# Patient Record
Sex: Male | Born: 1955
Health system: Southern US, Community
[De-identification: ages and names within clinical notes are randomized; demographics above are authoritative.]

## PROBLEM LIST (undated history)

## (undated) DIAGNOSIS — N529 Male erectile dysfunction, unspecified: Secondary | ICD-10-CM

## (undated) DIAGNOSIS — Z86010 Personal history of colonic polyps: Principal | ICD-10-CM

## (undated) DIAGNOSIS — T7840XA Allergy, unspecified, initial encounter: Secondary | ICD-10-CM

## (undated) DIAGNOSIS — E1065 Type 1 diabetes mellitus with hyperglycemia: Secondary | ICD-10-CM

## (undated) DIAGNOSIS — M545 Low back pain: Secondary | ICD-10-CM

## (undated) DIAGNOSIS — I10 Essential (primary) hypertension: Secondary | ICD-10-CM

## (undated) DIAGNOSIS — C9110 Chronic lymphocytic leukemia of B-cell type not having achieved remission: Secondary | ICD-10-CM

## (undated) DIAGNOSIS — E785 Hyperlipidemia, unspecified: Secondary | ICD-10-CM

## (undated) DIAGNOSIS — M1990 Unspecified osteoarthritis, unspecified site: Secondary | ICD-10-CM

## (undated) HISTORY — DX: Low back pain: M54.5

## (undated) HISTORY — PX: COLONOSCOPY: SHX174

## (undated) HISTORY — DX: Chronic lymphocytic leukemia of B-cell type not having achieved remission: C91.10

## (undated) HISTORY — DX: Type 1 diabetes mellitus with hyperglycemia: E10.65

## (undated) HISTORY — DX: Hyperlipidemia, unspecified: E78.5

## (undated) HISTORY — DX: Essential (primary) hypertension: I10

## (undated) HISTORY — DX: Unspecified osteoarthritis, unspecified site: M19.90

## (undated) HISTORY — DX: Allergy, unspecified, initial encounter: T78.40XA

## (undated) HISTORY — PX: OTHER SURGICAL HISTORY: SHX169

## (undated) HISTORY — DX: Personal history of colonic polyps: Z86.010

## (undated) HISTORY — DX: Male erectile dysfunction, unspecified: N52.9

---

## 1998-09-28 ENCOUNTER — Encounter: Payer: Self-pay | Admitting: Emergency Medicine

## 1998-09-28 ENCOUNTER — Emergency Department (HOSPITAL_COMMUNITY): Admission: EM | Admit: 1998-09-28 | Discharge: 1998-09-28 | Payer: Self-pay | Admitting: Emergency Medicine

## 1998-10-01 ENCOUNTER — Emergency Department (HOSPITAL_COMMUNITY): Admission: EM | Admit: 1998-10-01 | Discharge: 1998-10-01 | Payer: Self-pay | Admitting: Emergency Medicine

## 1998-10-02 ENCOUNTER — Emergency Department (HOSPITAL_COMMUNITY): Admission: EM | Admit: 1998-10-02 | Discharge: 1998-10-02 | Payer: Self-pay | Admitting: Emergency Medicine

## 1998-10-06 ENCOUNTER — Emergency Department (HOSPITAL_COMMUNITY): Admission: EM | Admit: 1998-10-06 | Discharge: 1998-10-06 | Payer: Self-pay | Admitting: Emergency Medicine

## 1998-12-25 ENCOUNTER — Emergency Department (HOSPITAL_COMMUNITY): Admission: EM | Admit: 1998-12-25 | Discharge: 1998-12-25 | Payer: Self-pay | Admitting: Emergency Medicine

## 2005-02-17 ENCOUNTER — Ambulatory Visit: Payer: Self-pay | Admitting: Family Medicine

## 2005-02-20 ENCOUNTER — Ambulatory Visit: Payer: Self-pay | Admitting: Family Medicine

## 2005-04-08 ENCOUNTER — Encounter: Admission: RE | Admit: 2005-04-08 | Discharge: 2005-07-07 | Payer: Self-pay | Admitting: Family Medicine

## 2005-08-11 ENCOUNTER — Ambulatory Visit: Payer: Self-pay | Admitting: Family Medicine

## 2005-12-01 ENCOUNTER — Ambulatory Visit: Payer: Self-pay | Admitting: Family Medicine

## 2006-09-30 ENCOUNTER — Telehealth: Payer: Self-pay | Admitting: Family Medicine

## 2006-12-21 ENCOUNTER — Ambulatory Visit: Payer: Self-pay | Admitting: Family Medicine

## 2006-12-21 DIAGNOSIS — M545 Low back pain, unspecified: Secondary | ICD-10-CM

## 2006-12-21 HISTORY — DX: Low back pain, unspecified: M54.50

## 2006-12-21 LAB — CONVERTED CEMR LAB
ALT: 25 units/L (ref 0–53)
AST: 21 units/L (ref 0–37)
Albumin: 4.2 g/dL (ref 3.5–5.2)
Alkaline Phosphatase: 56 units/L (ref 39–117)
BUN: 15 mg/dL (ref 6–23)
Basophils Absolute: 0 10*3/uL (ref 0.0–0.1)
Basophils Relative: 0.2 % (ref 0.0–1.0)
Bilirubin, Direct: 0.1 mg/dL (ref 0.0–0.3)
CO2: 30 meq/L (ref 19–32)
Calcium: 9.9 mg/dL (ref 8.4–10.5)
Chloride: 101 meq/L (ref 96–112)
Cholesterol: 184 mg/dL (ref 0–200)
Creatinine, Ser: 0.8 mg/dL (ref 0.4–1.5)
Creatinine,U: 90.5 mg/dL
Eosinophils Absolute: 0.4 10*3/uL (ref 0.0–0.6)
Eosinophils Relative: 4.2 % (ref 0.0–5.0)
GFR calc Af Amer: 132 mL/min
GFR calc non Af Amer: 109 mL/min
Glucose, Bld: 259 mg/dL — ABNORMAL HIGH (ref 70–99)
HCT: 42 % (ref 39.0–52.0)
HDL: 47.5 mg/dL (ref >39.0)
Hemoglobin: 14.6 g/dL (ref 13.0–17.0)
Hgb A1c MFr Bld: 9.1 % — ABNORMAL HIGH (ref 4.6–6.0)
LDL Cholesterol: 124 mg/dL — ABNORMAL HIGH (ref 0–99)
Lymphocytes Relative: 27 % (ref 12.0–46.0)
MCHC: 34.7 g/dL (ref 30.0–36.0)
MCV: 92.5 fL (ref 78.0–100.0)
Microalb Creat Ratio: 12.2 mg/g (ref 0.0–30.0)
Microalb, Ur: 1.1 mg/dL (ref 0.0–1.9)
Monocytes Absolute: 0.6 10*3/uL (ref 0.2–0.7)
Monocytes Relative: 7.1 % (ref 3.0–11.0)
Neutro Abs: 5.6 10*3/uL (ref 1.4–7.7)
Neutrophils Relative %: 61.5 % (ref 43.0–77.0)
PSA: 0.9 ng/mL (ref 0.10–4.00)
Platelets: 312 10*3/uL (ref 150–400)
Potassium: 4.7 meq/L (ref 3.5–5.1)
RBC: 4.54 M/uL (ref 4.22–5.81)
RDW: 12.3 % (ref 11.5–14.6)
Sodium: 137 meq/L (ref 135–145)
TSH: 1.87 microintl units/mL (ref 0.35–5.50)
Total Bilirubin: 1 mg/dL (ref 0.3–1.2)
Total CHOL/HDL Ratio: 3.9
Total Protein: 6.9 g/dL (ref 6.0–8.3)
Triglycerides: 61 mg/dL (ref 0–149)
VLDL: 12 mg/dL (ref 0–40)
WBC: 9 10*3/uL (ref 4.5–10.5)

## 2006-12-30 ENCOUNTER — Ambulatory Visit: Payer: Self-pay | Admitting: Family Medicine

## 2006-12-30 DIAGNOSIS — E785 Hyperlipidemia, unspecified: Secondary | ICD-10-CM | POA: Insufficient documentation

## 2006-12-30 DIAGNOSIS — I10 Essential (primary) hypertension: Secondary | ICD-10-CM

## 2006-12-30 DIAGNOSIS — E1065 Type 1 diabetes mellitus with hyperglycemia: Secondary | ICD-10-CM

## 2006-12-30 DIAGNOSIS — N529 Male erectile dysfunction, unspecified: Secondary | ICD-10-CM | POA: Insufficient documentation

## 2006-12-30 DIAGNOSIS — IMO0002 Reserved for concepts with insufficient information to code with codable children: Secondary | ICD-10-CM

## 2006-12-30 HISTORY — DX: Male erectile dysfunction, unspecified: N52.9

## 2006-12-30 HISTORY — DX: Type 1 diabetes mellitus with hyperglycemia: E10.65

## 2006-12-30 HISTORY — DX: Essential (primary) hypertension: I10

## 2006-12-30 HISTORY — DX: Reserved for concepts with insufficient information to code with codable children: IMO0002

## 2006-12-30 HISTORY — DX: Hyperlipidemia, unspecified: E78.5

## 2007-01-13 ENCOUNTER — Ambulatory Visit: Payer: Self-pay | Admitting: Family Medicine

## 2007-01-27 ENCOUNTER — Ambulatory Visit: Payer: Self-pay | Admitting: Family Medicine

## 2007-01-27 ENCOUNTER — Ambulatory Visit: Payer: Self-pay | Admitting: Internal Medicine

## 2007-02-02 ENCOUNTER — Ambulatory Visit: Payer: Self-pay | Admitting: Internal Medicine

## 2007-07-28 ENCOUNTER — Telehealth: Payer: Self-pay | Admitting: Family Medicine

## 2007-08-01 ENCOUNTER — Ambulatory Visit: Payer: Self-pay | Admitting: Family Medicine

## 2007-08-01 ENCOUNTER — Encounter: Payer: Self-pay | Admitting: *Deleted

## 2007-08-01 LAB — CONVERTED CEMR LAB
BUN: 17 mg/dL (ref 6–23)
CO2: 29 meq/L (ref 19–32)
Calcium: 9 mg/dL (ref 8.4–10.5)
Chloride: 105 meq/L (ref 96–112)
Creatinine, Ser: 0.9 mg/dL (ref 0.4–1.5)
GFR calc Af Amer: 114 mL/min
GFR calc non Af Amer: 95 mL/min
Glucose, Bld: 222 mg/dL — ABNORMAL HIGH (ref 70–99)
Hgb A1c MFr Bld: 7.7 % — ABNORMAL HIGH (ref 4.6–6.0)
Potassium: 4.4 meq/L (ref 3.5–5.1)
Sodium: 139 meq/L (ref 135–145)

## 2007-09-08 ENCOUNTER — Telehealth (INDEPENDENT_AMBULATORY_CARE_PROVIDER_SITE_OTHER): Payer: Self-pay | Admitting: *Deleted

## 2007-12-29 ENCOUNTER — Ambulatory Visit: Payer: Self-pay | Admitting: Family Medicine

## 2007-12-29 DIAGNOSIS — E109 Type 1 diabetes mellitus without complications: Secondary | ICD-10-CM | POA: Insufficient documentation

## 2007-12-29 LAB — CONVERTED CEMR LAB
ALT: 18 units/L (ref 0–53)
AST: 24 units/L (ref 0–37)
Albumin: 4.1 g/dL (ref 3.5–5.2)
Alkaline Phosphatase: 55 units/L (ref 39–117)
BUN: 16 mg/dL (ref 6–23)
Basophils Absolute: 0 10*3/uL (ref 0.0–0.1)
Basophils Relative: 0 % (ref 0.0–3.0)
Bilirubin Urine: NEGATIVE
Bilirubin, Direct: 0.1 mg/dL (ref 0.0–0.3)
Blood in Urine, dipstick: NEGATIVE
CO2: 30 meq/L (ref 19–32)
Calcium: 9.8 mg/dL (ref 8.4–10.5)
Chloride: 105 meq/L (ref 96–112)
Cholesterol: 159 mg/dL (ref 0–200)
Creatinine, Ser: 0.8 mg/dL (ref 0.4–1.5)
Creatinine,U: 205.7 mg/dL
Eosinophils Absolute: 0.3 10*3/uL (ref 0.0–0.7)
Eosinophils Relative: 4.1 % (ref 0.0–5.0)
GFR calc Af Amer: 131 mL/min
GFR calc non Af Amer: 108 mL/min
Glucose, Bld: 76 mg/dL (ref 70–99)
Glucose, Urine, Semiquant: NEGATIVE
HCT: 41.3 % (ref 39.0–52.0)
HDL: 61.7 mg/dL (ref >39.0)
Hemoglobin: 14 g/dL (ref 13.0–17.0)
Hgb A1c MFr Bld: 7.5 % — ABNORMAL HIGH (ref 4.6–6.0)
Ketones, urine, test strip: NEGATIVE
LDL Cholesterol: 89 mg/dL (ref 0–99)
Lymphocytes Relative: 31.9 % (ref 12.0–46.0)
MCHC: 33.9 g/dL (ref 30.0–36.0)
MCV: 94.7 fL (ref 78.0–100.0)
Microalb Creat Ratio: 8.8 mg/g (ref 0.0–30.0)
Microalb, Ur: 1.8 mg/dL (ref 0.0–1.9)
Monocytes Absolute: 0.7 10*3/uL (ref 0.1–1.0)
Monocytes Relative: 8.7 % (ref 3.0–12.0)
Neutro Abs: 4.8 10*3/uL (ref 1.4–7.7)
Neutrophils Relative %: 55.3 % (ref 43.0–77.0)
Nitrite: NEGATIVE
PSA: 1.2 ng/mL (ref 0.10–4.00)
Platelets: 275 10*3/uL (ref 150–400)
Potassium: 4.7 meq/L (ref 3.5–5.1)
Protein, U semiquant: NEGATIVE
RBC: 4.36 M/uL (ref 4.22–5.81)
RDW: 13.1 % (ref 11.5–14.6)
Sodium: 144 meq/L (ref 135–145)
Specific Gravity, Urine: 1.02
TSH: 1.49 microintl units/mL (ref 0.35–5.50)
Total Bilirubin: 1.2 mg/dL (ref 0.3–1.2)
Total CHOL/HDL Ratio: 2.6
Total Protein: 7.2 g/dL (ref 6.0–8.3)
Triglycerides: 41 mg/dL (ref 0–149)
Urobilinogen, UA: 2
VLDL: 8 mg/dL (ref 0–40)
WBC Urine, dipstick: NEGATIVE
WBC: 8.5 10*3/uL (ref 4.5–10.5)
pH: 5.5

## 2008-01-12 ENCOUNTER — Ambulatory Visit: Payer: Self-pay | Admitting: Family Medicine

## 2008-02-16 ENCOUNTER — Telehealth: Payer: Self-pay | Admitting: *Deleted

## 2008-04-12 ENCOUNTER — Ambulatory Visit: Payer: Self-pay | Admitting: Family Medicine

## 2008-04-12 LAB — CONVERTED CEMR LAB
BUN: 21 mg/dL (ref 6–23)
CO2: 30 meq/L (ref 19–32)
Calcium: 9.5 mg/dL (ref 8.4–10.5)
Chloride: 104 meq/L (ref 96–112)
Creatinine, Ser: 0.9 mg/dL (ref 0.4–1.5)
GFR calc Af Amer: 114 mL/min
GFR calc non Af Amer: 94 mL/min
Glucose, Bld: 174 mg/dL — ABNORMAL HIGH (ref 70–99)
Hgb A1c MFr Bld: 7.3 % — ABNORMAL HIGH (ref 4.6–6.0)
Potassium: 5.5 meq/L — ABNORMAL HIGH (ref 3.5–5.1)
Sodium: 140 meq/L (ref 135–145)

## 2008-04-23 ENCOUNTER — Ambulatory Visit: Payer: Self-pay | Admitting: Family Medicine

## 2008-10-10 ENCOUNTER — Ambulatory Visit: Payer: Self-pay | Admitting: Family Medicine

## 2008-10-10 LAB — CONVERTED CEMR LAB
BUN: 13 mg/dL (ref 6–23)
CO2: 32 meq/L (ref 19–32)
Calcium: 9.2 mg/dL (ref 8.4–10.5)
Chloride: 104 meq/L (ref 96–112)
Creatinine, Ser: 0.9 mg/dL (ref 0.4–1.5)
GFR calc non Af Amer: 113.64 mL/min (ref >60)
Glucose, Bld: 129 mg/dL — ABNORMAL HIGH (ref 70–99)
Hgb A1c MFr Bld: 8.4 % — ABNORMAL HIGH (ref 4.6–6.5)
Potassium: 3.9 meq/L (ref 3.5–5.1)
Sodium: 141 meq/L (ref 135–145)

## 2008-10-24 ENCOUNTER — Ambulatory Visit: Payer: Self-pay | Admitting: Family Medicine

## 2008-10-25 ENCOUNTER — Telehealth: Payer: Self-pay | Admitting: *Deleted

## 2008-11-08 ENCOUNTER — Telehealth: Payer: Self-pay | Admitting: Family Medicine

## 2009-01-30 ENCOUNTER — Encounter: Payer: Self-pay | Admitting: *Deleted

## 2009-03-28 ENCOUNTER — Ambulatory Visit: Payer: Self-pay | Admitting: Family Medicine

## 2009-03-28 LAB — CONVERTED CEMR LAB
BUN: 8 mg/dL (ref 6–23)
CO2: 31 meq/L (ref 19–32)
Calcium: 9.5 mg/dL (ref 8.4–10.5)
Chloride: 107 meq/L (ref 96–112)
Creatinine, Ser: 0.9 mg/dL (ref 0.4–1.5)
GFR calc non Af Amer: 113.44 mL/min (ref >60)
Glucose, Bld: 148 mg/dL — ABNORMAL HIGH (ref 70–99)
Hgb A1c MFr Bld: 7.8 % — ABNORMAL HIGH (ref 4.6–6.5)
Potassium: 4.6 meq/L (ref 3.5–5.1)
Sodium: 145 meq/L (ref 135–145)

## 2009-05-08 ENCOUNTER — Ambulatory Visit: Payer: Self-pay | Admitting: Family Medicine

## 2009-07-09 ENCOUNTER — Ambulatory Visit: Payer: Self-pay | Admitting: Family Medicine

## 2009-07-09 LAB — CONVERTED CEMR LAB
ALT: 21 units/L (ref 0–53)
AST: 24 units/L (ref 0–37)
Albumin: 4 g/dL (ref 3.5–5.2)
Alkaline Phosphatase: 54 units/L (ref 39–117)
BUN: 16 mg/dL (ref 6–23)
Basophils Absolute: 0 10*3/uL (ref 0.0–0.1)
Basophils Relative: 0.3 % (ref 0.0–3.0)
Bilirubin Urine: NEGATIVE
Bilirubin, Direct: 0.1 mg/dL (ref 0.0–0.3)
Blood in Urine, dipstick: NEGATIVE
CO2: 30 meq/L (ref 19–32)
Calcium: 9 mg/dL (ref 8.4–10.5)
Chloride: 104 meq/L (ref 96–112)
Cholesterol: 129 mg/dL (ref 0–200)
Creatinine, Ser: 0.9 mg/dL (ref 0.4–1.5)
Creatinine,U: 152.2 mg/dL
Eosinophils Absolute: 0.6 10*3/uL (ref 0.0–0.7)
Eosinophils Relative: 6.8 % — ABNORMAL HIGH (ref 0.0–5.0)
GFR calc non Af Amer: 119.42 mL/min (ref >60)
Glucose, Bld: 88 mg/dL (ref 70–99)
Glucose, Urine, Semiquant: NEGATIVE
HCT: 35 % — ABNORMAL LOW (ref 39.0–52.0)
HDL: 44.1 mg/dL (ref >39.00)
Hemoglobin: 12.1 g/dL — ABNORMAL LOW (ref 13.0–17.0)
Hgb A1c MFr Bld: 7.9 % — ABNORMAL HIGH (ref 4.6–6.5)
Ketones, urine, test strip: NEGATIVE
LDL Cholesterol: 78 mg/dL (ref 0–99)
Lymphocytes Relative: 36.7 % (ref 12.0–46.0)
Lymphs Abs: 3 10*3/uL (ref 0.7–4.0)
MCHC: 34.4 g/dL (ref 30.0–36.0)
MCV: 92.5 fL (ref 78.0–100.0)
Microalb Creat Ratio: 0.5 mg/g (ref 0.0–30.0)
Microalb, Ur: 0.8 mg/dL (ref 0.0–1.9)
Monocytes Absolute: 0.6 10*3/uL (ref 0.1–1.0)
Monocytes Relative: 7.1 % (ref 3.0–12.0)
Neutro Abs: 4 10*3/uL (ref 1.4–7.7)
Neutrophils Relative %: 49.1 % (ref 43.0–77.0)
Nitrite: NEGATIVE
PSA: 1.1 ng/mL (ref 0.10–4.00)
Platelets: 295 10*3/uL (ref 150.0–400.0)
Potassium: 4.2 meq/L (ref 3.5–5.1)
Protein, U semiquant: NEGATIVE
RBC: 3.79 M/uL — ABNORMAL LOW (ref 4.22–5.81)
RDW: 13.5 % (ref 11.5–14.6)
Sodium: 141 meq/L (ref 135–145)
Specific Gravity, Urine: 1.025
TSH: 2.21 microintl units/mL (ref 0.35–5.50)
Total Bilirubin: 1 mg/dL (ref 0.3–1.2)
Total CHOL/HDL Ratio: 3
Total Protein: 6.5 g/dL (ref 6.0–8.3)
Triglycerides: 35 mg/dL (ref 0.0–149.0)
Urobilinogen, UA: 0.2
VLDL: 7 mg/dL (ref 0.0–40.0)
WBC Urine, dipstick: NEGATIVE
WBC: 8.1 10*3/uL (ref 4.5–10.5)
pH: 5

## 2009-07-16 ENCOUNTER — Ambulatory Visit: Payer: Self-pay | Admitting: Family Medicine

## 2009-07-16 LAB — HM DIABETES FOOT EXAM

## 2009-10-23 ENCOUNTER — Ambulatory Visit: Payer: Self-pay | Admitting: Family Medicine

## 2009-10-23 LAB — CONVERTED CEMR LAB
BUN: 12 mg/dL (ref 6–23)
CO2: 28 meq/L (ref 19–32)
Calcium: 9.4 mg/dL (ref 8.4–10.5)
Chloride: 110 meq/L (ref 96–112)
Creatinine, Ser: 0.8 mg/dL (ref 0.4–1.5)
GFR calc non Af Amer: 137.58 mL/min (ref >60)
Glucose, Bld: 69 mg/dL — ABNORMAL LOW (ref 70–99)
Hgb A1c MFr Bld: 7.5 % — ABNORMAL HIGH (ref 4.6–6.5)
Potassium: 4.2 meq/L (ref 3.5–5.1)
Sodium: 144 meq/L (ref 135–145)

## 2009-10-28 ENCOUNTER — Ambulatory Visit: Payer: Self-pay | Admitting: Family Medicine

## 2010-01-23 ENCOUNTER — Ambulatory Visit: Payer: Self-pay | Admitting: Family Medicine

## 2010-01-23 LAB — CONVERTED CEMR LAB
CO2: 30 meq/L (ref 19–32)
Chloride: 104 meq/L (ref 96–112)
Creatinine, Ser: 0.8 mg/dL (ref 0.4–1.5)

## 2010-01-30 ENCOUNTER — Ambulatory Visit: Payer: Self-pay | Admitting: Family Medicine

## 2010-03-08 ENCOUNTER — Encounter: Payer: Self-pay | Admitting: Family Medicine

## 2010-03-11 NOTE — Assessment & Plan Note (Signed)
Summary: REVIEW LAB RESULTS // RS   Vital Signs:  Patient profile:   55 year old male Height:      68.25 inches Weight:      165 pounds BMI:     24.99 Temp:     97.7 degrees F oral BP sitting:   160 / 88  (left arm) Cuff size:   regular  Vitals Entered By: Kern Reap CMA Duncan Dull) (May 08, 2009 12:09 PM)  CC: follow-up visit Is Patient Diabetic? Yes Did you bring your meter with you today? No   CC:  follow-up visit.  History of Present Illness: Tyler Scott is a 55 year old male, who comes in today for reevaluation of hypertension and diabetes.  His current insulin dose is 15 units of Lantus daily.  Metformin 1000 mg b.i.d., Glucotrol, 10 mg b.i.d.  Fasting blood sugar 148, hemoglobin A1c7 .6%.  BP 160 systolic, diastolic 88 on lisinopril, 3 mg daily.  Allergies: No Known Drug Allergies  Review of Systems      See HPI  Physical Exam  General:  Well-developed,well-nourished,in no acute distress; alert,appropriate and cooperative throughout examination   Impression & Recommendations:  Problem # 1:  DIABETES MELLITUS, TYPE I (ICD-250.01) Assessment Deteriorated  The following medications were removed from the medication list:    Zestril 20 Mg Tabs (Lisinopril) .Marland Kitchen... Take 1 tablet by mouth every morning His updated medication list for this problem includes:    Metformin Hcl 1000 Mg Tabs (Metformin hcl) .Marland Kitchen... Take 1 tablet by mouth two times a day    Glucotrol 10 Mg Tabs (Glipizide) .Marland Kitchen... Take 1 tablet by mouth two times a day    Aspirin 81 Mg Tbec (Aspirin) ..... Qd    Lantus Solostar 100 Unit/ml Soln (Insulin glargine) .Marland KitchenMarland KitchenMarland KitchenMarland Kitchen 15 units q hs    Lisinopril 40 Mg Tabs (Lisinopril) .Marland Kitchen... 1 tab @ bedtime  Orders: Prescription Created Electronically [phone])  Problem # 2:  HYPERTENSION (ICD-401.9) Assessment: Deteriorated  The following medications were removed from the medication list:    Zestril 20 Mg Tabs (Lisinopril) .Marland Kitchen... Take 1 tablet by mouth every morning His  updated medication list for this problem includes:    Lisinopril 40 Mg Tabs (Lisinopril) .Marland Kitchen... 1 tab @ bedtime  Orders: Prescription Created Electronically [phone])  Complete Medication List: 1)  Metformin Hcl 1000 Mg Tabs (Metformin hcl) .... Take 1 tablet by mouth two times a day 2)  Glucotrol 10 Mg Tabs (Glipizide) .... Take 1 tablet by mouth two times a day 3)  Zocor 20 Mg Tabs (Simvastatin) .... Take 1 tablet by mouth at bedtime 4)  Aspirin 81 Mg Tbec (Aspirin) .... Qd 5)  Motrin 800 Mg  .... As needed 6)  Lantus Solostar 100 Unit/ml Soln (Insulin glargine) .Marland Kitchen.. 15 units q hs 7)  Accu-chek Compact Strp (Glucose blood) .... Test for glucose two times a day   dx 250.0 8)  Levitra 20 Mg Tabs (Vardenafil hcl) .... Uad 9)  Triamcinolone Acetonide 0.025 % Oint (Triamcinolone acetonide) .... Apply to affected area 10)  Lisinopril 40 Mg Tabs (Lisinopril) .Marland Kitchen.. 1 tab @ bedtime  Patient Instructions: 1)  increase the Lantus to 20 units at bedtime to check a fasting blood sugar daily.  If in two weeks your fasting blood sugars are at 100 and continued to 20 units daily.  Is not increased.  The insulin to 25 units daily.  In other words, increase by 5 units every two weeks until your fasting blood sugar drops to 100. 2)  Increase the lisinopril to 40 mg daily check her blood pressure daily if in 4 weeks.  Your blood pressure is normal........Marland Kitchen 130/80, or less............ then 40 mg is the Magic does.    If not increase it to 60 3)  Please schedule a follow-up appointment in 2 months.........250.00 4)  BMP prior to visit, ICD-9: 5)  Hepatic Panel prior to visit, ICD-9: 6)  Lipid Panel prior to visit, ICD-9: 7)  TSH prior to visit, ICD-9: 8)  CBC w/ Diff prior to visit, ICD-9: 9)  Urine-dip prior to visit, ICD-9: 10)  PSA prior to visit, ICD-9: 11)  HbgA1C prior to visit, ICD-9: 12)  Urine Microalbumin prior to visit, ICD-9: Prescriptions: LEVITRA 20 MG TABS (VARDENAFIL HCL) UAD  #6 Tablet x  11   Entered and Authorized by:   Roderick Pee MD   Signed by:   Roderick Pee MD on 05/08/2009   Method used:   Print then Give to Patient   RxID:   [id number] TRIAMCINOLONE ACETONIDE 0.025 % OINT (TRIAMCINOLONE ACETONIDE) apply to affected area  #60 gr x 11   Entered and Authorized by:   Roderick Pee MD   Signed by:   Roderick Pee MD on 05/08/2009   Method used:   Electronically to        [facility]* (mail-order)             ,          Ph: [phone]       Fax: [phone]   RxID:   [id number] ACCU-CHEK COMPACT   STRP (GLUCOSE BLOOD) test for glucose two times a day   dx 250.0  #200 x 3   Entered and Authorized by:   Roderick Pee MD   Signed by:   Roderick Pee MD on 05/08/2009   Method used:   Electronically to        [facility]* (mail-order)             ,          Ph: [phone]       Fax: [phone]   RxID:   [id number] LANTUS SOLOSTAR 100 UNIT/ML  SOLN (INSULIN GLARGINE) 15 units q hs  #2 x 3   Entered and Authorized by:   Roderick Pee MD   Signed by:   Roderick Pee MD on 05/08/2009   Method used:   Electronically to        [facility]* (mail-order)             ,          Ph: [phone]       Fax: [phone]   RxID:   [id number] ZOCOR 20 MG  TABS (SIMVASTATIN) Take 1 tablet by mouth at bedtime  #100 x 4   Entered and Authorized by:   Roderick Pee MD   Signed by:   Roderick Pee MD on 05/08/2009   Method used:   Electronically to        [facility]* (mail-order)             ,          Ph: [phone]       Fax: [phone]   RxID:   [id number] GLUCOTROL 10 MG  TABS (GLIPIZIDE) Take 1 tablet by mouth two times a day  #200 x 4   Entered and Authorized by:   Roderick Pee MD  Signed by:   Roderick Pee MD on 05/08/2009   Method used:   Electronically to        [facility]* (mail-order)             ,          Ph: [phone]       Fax: [phone]   RxID:   [id number] METFORMIN HCL  1000 MG  TABS (METFORMIN HCL) Take 1 tablet by mouth two times a day  #200 x 4   Entered and Authorized by:   Roderick Pee MD   Signed by:   Roderick Pee MD on 05/08/2009   Method used:   Electronically to        [facility]* (mail-order)             ,          Ph: [phone]       Fax: [phone]   RxID:   [id number] LISINOPRIL 40 MG TABS (LISINOPRIL) 1 tab @ bedtime  #100 x 3   Entered and Authorized by:   Roderick Pee MD   Signed by:   Roderick Pee MD on 05/08/2009   Method used:   Electronically to        [facility]* (mail-order)             ,          Ph: [phone]       Fax: [phone]   RxID:   [id number]

## 2010-03-11 NOTE — Assessment & Plan Note (Signed)
Summary: fu on labs/njr   Vital Signs:  Patient profile:   55 year old male Weight:      158 pounds Temp:     97.5 degrees F oral BP sitting:   140 / 90  (left arm) Cuff size:   regular  Vitals Entered By: Kern Reap CMA Duncan Dull) (October 28, 2009 10:15 AM) CC: follow-up visit Is Patient Diabetic? Yes Did you bring your meter with you today? No Pain Assessment Patient in pain? no        CC:  follow-up visit.  History of Present Illness: Tyler Scott is a 56 year old type I diabetic who comes in today for follow-up of diabetes.  He is not taking his metformin on a regular basis because it gives him diarrhea.  He doesn't take his Glucotrol and a regular basis because sometimes his blood sugar in th morning as low.  He does take his Lantus but varies between 50 and 20 units daily.  We discussed various options.  We will try to simplify his treatment program by stopping your medicines and going just strictly with the Lantus.  His blood sugar did yesterday was 69......Marland KitchenA1c 7.5%  He is also complaining of pain in his right knee.  He's had this problem off and on for many years.  However in the last 3, months it's gotten worse.  He said no history of any ligament damage in the past.  It one time he went to see an orthopedist and had it roids.  He used to be a high school basketball official  Allergies: No Known Drug Allergies  Past History:  Past medical, surgical, family and social histories (including risk factors) reviewed for relevance to current acute and chronic problems.  Past Medical History: Reviewed history from 01/12/2008 and no changes required.  Hyperlipidemia Hypertension ED Diabetes mellitus, type I  Family History: Reviewed history from 12/30/2006 and no changes required. Family History of Alcoholism/Addiction Family History Diabetes 1st degree relative  Social History: Reviewed history from 12/30/2006 and no changes  required. Occupation:[employer] Married Never Smoked Alcohol use-no Drug use-no Regular exercise-yes  Review of Systems      See HPI       Flu Vaccine Consent Questions     Do you have a history of severe allergic reactions to this vaccine? no    Any prior history of allergic reactions to egg and/or gelatin? no    Do you have a sensitivity to the preservative Thimersol? no    Do you have a past history of Guillan-Barre Syndrome? no    Do you currently have an acute febrile illness? no    Have you ever had a severe reaction to latex? no    Vaccine information given and explained to patient? yes    Are you currently pregnant? no    Lot Number:[id number]   Exp Date:08/08/2009   Site Given  Left Deltoid IM   Physical Exam  General:  Well-developed,well-nourished,in no acute distress; alert,appropriate and cooperative throughout examination Msk:  an inspection, there is some slight swelling of his right knee.  No erythema.  Ligaments intact   Problems:  Medical Problems Added: 1)  Dx of Effusion,swelling of Joint, Unspecified Site  (ICD-719.00)  Impression & Recommendations:  Problem # 1:  EFFUSION,SWELLING OF  JOINT, UNSPECIFIED SITE (ICD-719.00) Assessment New  Problem # 2:  DIABETES MELLITUS, TYPE I (ICD-250.01) Assessment: Deteriorated  His updated medication list for this problem includes:    Metformin Hcl 1000 Mg Tabs (Metformin hcl) .Marland KitchenMarland KitchenMarland KitchenMarland Kitchen  Take 1 tablet by mouth two times a day    Glucotrol 10 Mg Tabs (Glipizide) .Marland Kitchen... Take 1 tablet by mouth two times a day    Aspirin 81 Mg Tbec (Aspirin) ..... Qd    Lantus Solostar 100 Unit/ml Soln (Insulin glargine) .Marland KitchenMarland KitchenMarland KitchenMarland Kitchen 15 units q hs    Lisinopril 40 Mg Tabs (Lisinopril) .Marland Kitchen... 2 poqam  Complete Medication List: 1)  Metformin Hcl 1000 Mg Tabs (Metformin hcl) .... Take 1 tablet by mouth two times a day 2)  Glucotrol 10 Mg Tabs (Glipizide) .... Take 1 tablet by mouth two times a day 3)  Zocor 20 Mg Tabs (Simvastatin) .... Take 1  tablet by mouth at bedtime 4)  Aspirin 81 Mg Tbec (Aspirin) .... Qd 5)  Motrin 800 Mg  .... As needed 6)  Lantus Solostar 100 Unit/ml Soln (Insulin glargine) .Marland Kitchen.. 15 units q hs 7)  Accu-chek Compact Strp (Glucose blood) .... Test for glucose two times a day   dx 250.0 8)  Levitra 20 Mg Tabs (Vardenafil hcl) .... Uad 9)  Triamcinolone Acetonide 0.025 % Oint (Triamcinolone acetonide) .... Apply to affected area 10)  Lisinopril 40 Mg Tabs (Lisinopril) .... 2 poqam 11)  Pen Needles 31g X 6 Mm Misc (Insulin pen needle) .... Use once daily with lantus for glucose contol  Other Orders: Admin 1st Vaccine (57846) Flu Vaccine 54yrs + (96295)  Patient Instructions: 1)  take 400 mg of Motrin twice daily with food elevation and ice for your right knee pain.  If the symptoms get worse or he would like to consult.  I would recommend Dr. Norlene Campbell. 2)  Stop the med [name], and glipizide, completely.  Take 20 units of Lantus at bedtime.  Check a fasting blood sugar daily in the morning.  If after two weeks, your blood sugar, fasting in the morning, is over 120 increase the Lantus to 25 units nightly.  In other words, increase by 5 units every two weeks and two, your blood sugar drops and stays below 120. 3)  Please schedule a follow-up appointment in 3 months..250.01 4)  BMP prior to visit, ICD-9: 5)  HbgA1C prior to visit, ICD-9: Prescriptions: LEVITRA 20 MG TABS (VARDENAFIL HCL) UAD  #6 Tablet x 11   Entered by:   Kern Reap CMA (AAMA)   Authorized by:   Roderick Pee MD   Signed by:   Kern Reap CMA (AAMA) on 10/28/2009   Method used:   Electronically to        [facility]  [phone]* (retail)       [address] [place], [place]  [address]       Ph: [phone] or [phone]       Fax: [phone]   RxID:   [id number] PEN NEEDLES 31G X 6 MM MISC (INSULIN PEN NEEDLE) use once daily with lantus for glucose contol  #90 x 3   Entered by:   Kern Reap CMA (AAMA)    Authorized by:   Roderick Pee MD   Signed by:   Kern Reap CMA (AAMA) on 10/28/2009   Method used:   Electronically to        [facility]  [phone]* (retail)       [address] [place], [place]  [address]       Ph: [phone] or [phone]       Fax: [phone]   RxID:  [id number]  

## 2010-03-11 NOTE — Assessment & Plan Note (Signed)
Summary: fu on labwork/njr   Vital Signs:  Patient profile:   55 year old male Weight:      164 pounds Temp:     97.9 degrees F oral BP sitting:   132 / 80  (left arm) Cuff size:   regular  Vitals Entered By: Kern Reap CMA Duncan Dull) (July 16, 2009 12:06 PM) CC: follow-up visit   CC:  follow-up visit.  History of Present Illness: Tyler Scott is a 55 year old, married male, nonsmoker, type I diabetic, who comes in today for annual physical evaluation.  His diabetes is treated with metformin 1000 mg b.i.d., Glucotrol 10 mg b.i.d., Lantus 15 units nightly.  Fasting blood sugar 88 however, her hemoglobin A1c7 .9%.  He states the last month since that, warm he's been more active, and the scene.  His blood sugars dropping.  He has diabetic retinopathy.  He saw Dr. Jerolyn Center recently for treatment of the retinopathy.  He has hyperlipidemia for which he takes Zocor 20 mg nightly lipids are ago.  He also takes lisinopril 80 mg daily BP 132/80.  Continue that medication.  He also uses Levitra p.r.n. for ED.  He takes an 81-mg baby aspirin   Allergies: No Known Drug Allergies  Past History:  Past medical, surgical, family and social histories (including risk factors) reviewed, and no changes noted (except as noted below).  Past Medical History: Reviewed history from 01/12/2008 and no changes required.  Hyperlipidemia Hypertension ED Diabetes mellitus, type I  Family History: Reviewed history from 12/30/2006 and no changes required. Family History of Alcoholism/Addiction Family History Diabetes 1st degree relative  Social History: Reviewed history from 12/30/2006 and no changes required. Occupation:[employer] Married Never Smoked Alcohol use-no Drug use-no Regular exercise-yes  Review of Systems      See HPI  Physical Exam  General:  Well-developed,well-nourished,in no acute distress; alert,appropriate and cooperative throughout examination Head:  Normocephalic and  atraumatic without obvious abnormalities. No apparent alopecia or balding. Eyes:  No corneal or conjunctival inflammation noted. EOMI. Perrla. Funduscopic exam benign, without hemorrhages, exudates or papilledema. Vision grossly normal. Ears:  External ear exam shows no significant lesions or deformities.  Otoscopic examination reveals clear canals, tympanic membranes are intact bilaterally without bulging, retraction, inflammation or discharge. Hearing is grossly normal bilaterally. Nose:  External nasal examination shows no deformity or inflammation. Nasal mucosa are pink and moist without lesions or exudates. Mouth:  Oral mucosa and oropharynx without lesions or exudates.  Teeth in good repair. Neck:  No deformities, masses, or tenderness noted. Chest Wall:  No deformities, masses, tenderness or gynecomastia noted. Breasts:  No masses or gynecomastia noted Lungs:  Normal respiratory effort, chest expands symmetrically. Lungs are clear to auscultation, no crackles or wheezes. Heart:  Normal rate and regular rhythm. S1 and S2 normal without gallop, murmur, click, rub or other extra sounds. Abdomen:  Bowel sounds positive,abdomen soft and non-tender without masses, organomegaly or hernias noted. Rectal:  No external abnormalities noted. Normal sphincter tone. No rectal masses or tenderness. Genitalia:  Testes bilaterally descended without nodularity, tenderness or masses. No scrotal masses or lesions. No penis lesions or urethral discharge. Prostate:  Prostate gland firm and smooth, no enlargement, nodularity, tenderness, mass, asymmetry or induration. Msk:  No deformity or scoliosis noted of thoracic or lumbar spine.   Pulses:  R and L carotid,radial,femoral,dorsalis pedis and posterior tibial pulses are full and equal bilaterally Extremities:  No clubbing, cyanosis, edema, or deformity noted with normal full range of motion of all joints.  Neurologic:  No cranial nerve deficits noted. Station and  gait are normal. Plantar reflexes are down-going bilaterally. DTRs are symmetrical throughout. Sensory, motor and coordinative functions appear intact. Skin:  Intact without suspicious lesions or rashes Cervical Nodes:  No lymphadenopathy noted Axillary Nodes:  No palpable lymphadenopathy Inguinal Nodes:  No significant adenopathy Psych:  Cognition and judgment appear intact. Alert and cooperative with normal attention span and concentration. No apparent delusions, illusions, hallucinations  Diabetes Management Exam:    Foot Exam (with socks and/or shoes not present):       Sensory-Pinprick/Light touch:          Left medial foot (L-4): normal          Left dorsal foot (L-5): normal          Left lateral foot (S-1): normal          Right medial foot (L-4): normal          Right dorsal foot (L-5): normal          Right lateral foot (S-1): normal       Sensory-Monofilament:          Left foot: normal          Right foot: normal       Inspection:          Left foot: normal          Right foot: normal       Nails:          Left foot: normal          Right foot: normal    Eye Exam:       Eye Exam done elsewhere          Date: 07/10/2009          Results: diabetic retinopathy          Done by: [email or web address]   Impression & Recommendations:  Problem # 1:  DIABETES MELLITUS, TYPE I (ICD-250.01) Assessment Improved  His updated medication list for this problem includes:    Metformin Hcl 1000 Mg Tabs (Metformin hcl) .Marland Kitchen... Take 1 tablet by mouth two times a day    Glucotrol 10 Mg Tabs (Glipizide) .Marland Kitchen... Take 1 tablet by mouth two times a day    Aspirin 81 Mg Tbec (Aspirin) ..... Qd    Lantus Solostar 100 Unit/ml Soln (Insulin glargine) .Marland KitchenMarland KitchenMarland KitchenMarland Kitchen 15 units q hs    Lisinopril 40 Mg Tabs (Lisinopril) .Marland Kitchen... 2 poqam  Problem # 2:  ERECTILE DYSFUNCTION, ORGANIC (ICD-607.84) Assessment: Improved  His updated medication list for this problem includes:    Levitra 20 Mg Tabs (Vardenafil hcl) .....  Uad  Problem # 3:  HYPERTENSION (ICD-401.9) Assessment: Improved  His updated medication list for this problem includes:    Lisinopril 40 Mg Tabs (Lisinopril) .Marland Kitchen... 2 poqam  Orders: EKG w/ Interpretation (93000)  Problem # 4:  HYPERLIPIDEMIA (ICD-272.4) Assessment: Improved  His updated medication list for this problem includes:    Zocor 20 Mg Tabs (Simvastatin) .Marland Kitchen... Take 1 tablet by mouth at bedtime  Orders: EKG w/ Interpretation (93000)  Problem # 5:  HEALTH SCREENING (ICD-V70.0) Assessment: Unchanged  Complete Medication List: 1)  Metformin Hcl 1000 Mg Tabs (Metformin hcl) .... Take 1 tablet by mouth two times a day 2)  Glucotrol 10 Mg Tabs (Glipizide) .... Take 1 tablet by mouth two times a day 3)  Zocor 20 Mg Tabs (Simvastatin) .... Take 1 tablet by mouth at bedtime 4)  Aspirin 81 Mg Tbec (Aspirin) .... Qd 5)  Motrin 800 Mg  .... As needed 6)  Lantus Solostar 100 Unit/ml Soln (Insulin glargine) .Marland Kitchen.. 15 units q hs 7)  Accu-chek Compact Strp (Glucose blood) .... Test for glucose two times a day   dx 250.0 8)  Levitra 20 Mg Tabs (Vardenafil hcl) .... Uad 9)  Triamcinolone Acetonide 0.025 % Oint (Triamcinolone acetonide) .... Apply to affected area 10)  Lisinopril 40 Mg Tabs (Lisinopril) .... 2 poqam  Patient Instructions: 1)  Please schedule a follow-up appointment in 3 months. 2)  BMP prior to visit, ICD-9: 3)  HbgA1C prior to visit, ICD-9: 4)  Check your blood sugars regularly. If your readings are usually above : or below 70 you should contact our office. 5)  It is important that your Diabetic A1c level is checked every 3 months. 6)  See your eye doctor yearly to check for diabetic eye damage. 7)  Check your feet each night for sore areas, calluses or signs of infection. 8)  Check your Blood Pressure regularly. If it is above: you should make an appointment. Prescriptions: LISINOPRIL 40 MG TABS (LISINOPRIL) 2 poqam  #200 x 3   Entered and Authorized by:   Roderick Pee MD   Signed by:   Roderick Pee MD on 07/16/2009   Method used:   Print then Give to Patient   RxID:   [id number] LISINOPRIL 40 MG TABS (LISINOPRIL) 2 poqam  #200 x 3   Entered and Authorized by:   Roderick Pee MD   Signed by:   Roderick Pee MD on 07/16/2009   Method used:   Electronically to        [facility] (retail)       [address] [place], [place]  [address]       Ph: [phone]       Fax: [phone]   RxID:   [id number]

## 2010-03-20 ENCOUNTER — Encounter: Payer: Self-pay | Admitting: Family Medicine

## 2010-03-20 ENCOUNTER — Ambulatory Visit (INDEPENDENT_AMBULATORY_CARE_PROVIDER_SITE_OTHER): Payer: 59 | Admitting: Family Medicine

## 2010-03-20 DIAGNOSIS — E1065 Type 1 diabetes mellitus with hyperglycemia: Secondary | ICD-10-CM

## 2010-03-20 DIAGNOSIS — I10 Essential (primary) hypertension: Secondary | ICD-10-CM

## 2010-03-20 DIAGNOSIS — IMO0002 Reserved for concepts with insufficient information to code with codable children: Secondary | ICD-10-CM

## 2010-03-20 NOTE — Patient Instructions (Signed)
Continue your current blood pressure medication, however, increased her insulin to 25 units nightly at bedtime.  The goal is to get your hemoglobin A1c from 7.5, down to 6.5.  Return the first week in April for lab work and schedule a follow-up office visit one week after your lab.  Check your blood pressure once weekly, since it's back to normal

## 2010-03-20 NOTE — Progress Notes (Signed)
  Subjective:    Patient ID: Tyler Scott, male    DOB: 1955/05/01, 55 y.o.   MRN: [id number]  Tyler Scott is a 55 year old male type I diabetic who comes in today for evaluation of diabetes and hypertension.  He states his fasting blood sugars at home range from 92, and occasional hundred and 70.  He thinks his average is about 120.  His last hemoglobin A1c January 23, 2010 with 7.5.  However, his fasting blood sugar at that point, was 85.  Blood pressure 130/88, here this morning at home.  His blood pressure was 128/77    Review of Systems Negative    Objective:   Physical Exam Well-developed well-nourished, male in no acute distress       Assessment & Plan:  Diabetes type 1, need to increase his insulin.  Follow-up A1c in 6 weeks.  Hypertension.  Continue above medication

## 2010-04-14 ENCOUNTER — Telehealth: Payer: Self-pay | Admitting: Family Medicine

## 2010-04-14 DIAGNOSIS — E119 Type 2 diabetes mellitus without complications: Secondary | ICD-10-CM

## 2010-04-14 DIAGNOSIS — E785 Hyperlipidemia, unspecified: Secondary | ICD-10-CM

## 2010-04-14 DIAGNOSIS — N529 Male erectile dysfunction, unspecified: Secondary | ICD-10-CM

## 2010-04-14 DIAGNOSIS — I10 Essential (primary) hypertension: Secondary | ICD-10-CM

## 2010-04-14 MED ORDER — GLUCOSE BLOOD VI STRP: ORAL_STRIP | Status: DC

## 2010-04-14 MED ORDER — LISINOPRIL 40 MG PO TABS: 40.0000 mg | ORAL_TABLET | Freq: Every day | ORAL | Status: DC

## 2010-04-14 MED ORDER — METFORMIN HCL 1000 MG PO TABS: 1000.0000 mg | ORAL_TABLET | Freq: Two times a day (BID) | ORAL | Status: DC

## 2010-04-14 MED ORDER — VARDENAFIL HCL 20 MG PO TABS: 20.0000 mg | ORAL_TABLET | Freq: Every day | ORAL | Status: DC | PRN

## 2010-04-14 MED ORDER — PEN NEEDLES 31G X 6 MM MISC: 1.0000 | Status: DC

## 2010-04-14 MED ORDER — SIMVASTATIN 20 MG PO TABS: 20.0000 mg | ORAL_TABLET | Freq: Every day | ORAL | Status: DC

## 2010-04-14 MED ORDER — INSULIN GLARGINE 100 UNIT/ML ~~LOC~~ SOLN: 15.0000 [IU] | Freq: Every day | SUBCUTANEOUS | Status: DC

## 2010-04-14 MED ORDER — GLIPIZIDE 10 MG PO TABS: 10.0000 mg | ORAL_TABLET | Freq: Two times a day (BID) | ORAL | Status: DC

## 2010-04-14 NOTE — Telephone Encounter (Signed)
Spoke with patient and rx sent to correct pharmacy

## 2010-04-14 NOTE — Telephone Encounter (Signed)
Pt called to adv that his meds were sent to the wrong mail order pharmacy..... Adv that meds were supposed to be sent to [facility] order.... Pt could not remember what meds he was supposed to have sent just states "they're the same ones that were accidentally sent to [facility]".

## 2010-05-08 ENCOUNTER — Ambulatory Visit: Payer: 59 | Admitting: Family Medicine

## 2010-05-13 ENCOUNTER — Ambulatory Visit (INDEPENDENT_AMBULATORY_CARE_PROVIDER_SITE_OTHER): Payer: 59 | Admitting: Family Medicine

## 2010-05-13 DIAGNOSIS — L723 Sebaceous cyst: Secondary | ICD-10-CM

## 2010-05-13 DIAGNOSIS — IMO0002 Reserved for concepts with insufficient information to code with codable children: Secondary | ICD-10-CM

## 2010-05-15 ENCOUNTER — Encounter: Payer: Self-pay | Admitting: Family Medicine

## 2010-05-15 NOTE — Progress Notes (Signed)
  Subjective:    Patient ID: Tyler Scott, male    DOB: [birth year], 55 y.o.   MRN: [id number]  Tyler Scott is a 55 year old diabetic, who comes in today ostensibly for removal of a lesion on the posterior portion of his scalp.  He does have multiple small cystic lesions 3 to 4 mm in diameter.  I reassured them.  These are cystic type lesions and do not require removal.  No charge for visit    Review of Systems General and dermatologic review of systems otherwise negative    Objective:   Physical Exam    Well-developed well-nourished, male in no acute distress.  Examination scalp showed multiple small 3 to 4 mm cystic type lesion    Assessment & Plan:  Subcutaneous cystic lesions of the scalp,,,,,,,,,,,, reassured no treatment

## 2010-05-15 NOTE — Patient Instructions (Signed)
Return prn 

## 2010-05-23 ENCOUNTER — Other Ambulatory Visit: Payer: 59

## 2010-05-29 ENCOUNTER — Ambulatory Visit: Payer: 59 | Admitting: Family Medicine

## 2010-07-10 ENCOUNTER — Ambulatory Visit (INDEPENDENT_AMBULATORY_CARE_PROVIDER_SITE_OTHER): Payer: 59 | Admitting: Family Medicine

## 2010-07-10 ENCOUNTER — Encounter: Payer: Self-pay | Admitting: Family Medicine

## 2010-07-10 VITALS — BP 110/80 | Temp 97.7°F | Wt 161.0 lb

## 2010-07-10 DIAGNOSIS — E109 Type 1 diabetes mellitus without complications: Secondary | ICD-10-CM

## 2010-07-10 DIAGNOSIS — M5090 Cervical disc disorder, unspecified, unspecified cervical region: Secondary | ICD-10-CM

## 2010-07-10 MED ORDER — TRAMADOL HCL 50 MG PO TABS: ORAL_TABLET | ORAL | Status: DC

## 2010-07-10 NOTE — Patient Instructions (Signed)
For your neck pain, take 800 mg of Motrin twice daily with food and wear a soft cervical collar at bedtime.  If after two weeks your symptoms persist, then call us and we will get her set up for physical therapy.  If the two cysts in your neck increase in size.  I would recommend you see Dr. Dillard Cannon ent  for consult  Return in two months for follow-up of hy  diabetes,,,,,,,,,,, fasting labs one week prior

## 2010-07-10 NOTE — Progress Notes (Signed)
  Subjective:    Patient ID: Tyler Scott, male    DOB: [birth year], 55 y.o.   MRN: [id number]  [name] is a 55 year old, married man, nonsmoker, and comes in today for evaluation of 3 problems.  For the past two, weeks he's noted a lump on the right side of his neck.  It hasn't gotten any bigger.  No history of trauma.  Also two weeks ago.  He mowed the yard, and the next day he noticed some soreness in right side of his neck and some tingling in his right arm that comes and goes.  No history of trauma.    Review of Systems General metabolic and must discover review of systems otherwise negative.  Last A1c7 .5% in December 2011    Objective:   Physical Exam    Well-developed well-nourished, male in no acute distress.  Examination of the neck appears normal.  On palpation there to 10 mm x 10 mm, soft, movable, enlarged posterior cervical lymph nodes.  The wrist and neck exam is normal.  Neurologic exam normal, including muscle strength, sensation, and reflexes.    Assessment & Plan:  Cervical disk disease.  Plan Motrin 800 b.i.d. A soft cervical collar PT if symptoms persist.  Benign adenopathy,,,,,,,,,, refer to Dr. Narda Bonds for removal if the lesions get bigger,  Diabetes type I.  Return for A1c

## 2010-08-18 ENCOUNTER — Encounter (HOSPITAL_BASED_OUTPATIENT_CLINIC_OR_DEPARTMENT_OTHER): Payer: 59 | Admitting: Oncology

## 2010-08-18 ENCOUNTER — Other Ambulatory Visit: Payer: Self-pay | Admitting: Oncology

## 2010-08-18 DIAGNOSIS — C9110 Chronic lymphocytic leukemia of B-cell type not having achieved remission: Secondary | ICD-10-CM

## 2010-08-18 DIAGNOSIS — C8581 Other specified types of non-Hodgkin lymphoma, lymph nodes of head, face, and neck: Secondary | ICD-10-CM

## 2010-08-18 LAB — CBC WITH DIFFERENTIAL/PLATELET
Basophils Absolute: 0 10*3/uL (ref 0.0–0.1)
Eosinophils Absolute: 0.2 10*3/uL (ref 0.0–0.5)
HCT: 38.5 % (ref 38.4–49.9)
HGB: 12.8 g/dL — ABNORMAL LOW (ref 13.0–17.1)
LYMPH%: 34.7 % (ref 14.0–49.0)
MCV: 88.1 fL (ref 79.3–98.0)
MONO#: 0.6 10*3/uL (ref 0.1–0.9)
MONO%: 6.8 % (ref 0.0–14.0)
NEUT#: 4.8 10*3/uL (ref 1.5–6.5)
Platelets: 284 10*3/uL (ref 140–400)
WBC: 8.5 10*3/uL (ref 4.0–10.3)

## 2010-08-19 ENCOUNTER — Other Ambulatory Visit: Payer: Self-pay | Admitting: Oncology

## 2010-08-19 DIAGNOSIS — C9110 Chronic lymphocytic leukemia of B-cell type not having achieved remission: Secondary | ICD-10-CM

## 2010-08-19 LAB — COMPREHENSIVE METABOLIC PANEL
ALT: 22 U/L (ref 0–53)
Alkaline Phosphatase: 66 U/L (ref 39–117)
Creatinine, Ser: 0.89 mg/dL (ref 0.50–1.35)
Sodium: 139 mEq/L (ref 135–145)
Total Bilirubin: 0.7 mg/dL (ref 0.3–1.2)
Total Protein: 7.3 g/dL (ref 6.0–8.3)

## 2010-08-19 LAB — URIC ACID: Uric Acid, Serum: 5 mg/dL (ref 4.0–7.8)

## 2010-08-21 ENCOUNTER — Other Ambulatory Visit (INDEPENDENT_AMBULATORY_CARE_PROVIDER_SITE_OTHER): Payer: 59

## 2010-08-21 DIAGNOSIS — E1065 Type 1 diabetes mellitus with hyperglycemia: Secondary | ICD-10-CM

## 2010-08-21 DIAGNOSIS — IMO0002 Reserved for concepts with insufficient information to code with codable children: Secondary | ICD-10-CM

## 2010-08-21 DIAGNOSIS — E109 Type 1 diabetes mellitus without complications: Secondary | ICD-10-CM

## 2010-08-21 LAB — HEMOGLOBIN A1C: Hgb A1c MFr Bld: 7.6 % — ABNORMAL HIGH (ref 4.6–6.5)

## 2010-08-21 LAB — BASIC METABOLIC PANEL
BUN: 16 mg/dL (ref 6–23)
Chloride: 103 mEq/L (ref 96–112)
GFR: 139.27 mL/min (ref >60.00)
Glucose, Bld: 128 mg/dL — ABNORMAL HIGH (ref 70–99)
Potassium: 4.5 mEq/L (ref 3.5–5.1)
Sodium: 142 mEq/L (ref 135–145)

## 2010-08-21 LAB — MICROALBUMIN / CREATININE URINE RATIO
Creatinine,U: 120.6 mg/dL
Microalb Creat Ratio: 0.6 mg/g (ref 0.0–30.0)

## 2010-08-26 ENCOUNTER — Other Ambulatory Visit: Payer: Self-pay | Admitting: Oncology

## 2010-08-26 ENCOUNTER — Other Ambulatory Visit (HOSPITAL_COMMUNITY): Payer: 59

## 2010-08-26 ENCOUNTER — Ambulatory Visit (HOSPITAL_COMMUNITY)
Admission: RE | Admit: 2010-08-26 | Discharge: 2010-08-26 | Disposition: A | Discharge status: Home / Self Care | Payer: 59 | Source: Ambulatory Visit | Attending: Oncology | Admitting: Oncology

## 2010-08-26 ENCOUNTER — Encounter (HOSPITAL_COMMUNITY): Payer: Self-pay

## 2010-08-26 DIAGNOSIS — C9100 Acute lymphoblastic leukemia not having achieved remission: Secondary | ICD-10-CM | POA: Insufficient documentation

## 2010-08-26 DIAGNOSIS — C9110 Chronic lymphocytic leukemia of B-cell type not having achieved remission: Secondary | ICD-10-CM

## 2010-08-26 DIAGNOSIS — R599 Enlarged lymph nodes, unspecified: Secondary | ICD-10-CM | POA: Insufficient documentation

## 2010-08-26 MED ORDER — IOHEXOL 300 MG/ML  SOLN
125.0000 mL | Freq: Once | INTRAMUSCULAR | Status: AC | PRN
  Administered 2010-08-26: 125 mL via INTRAVENOUS

## 2010-08-27 ENCOUNTER — Ambulatory Visit (HOSPITAL_COMMUNITY)
Admission: RE | Admit: 2010-08-27 | Discharge: 2010-08-27 | Disposition: A | Discharge status: Home / Self Care | Payer: 59 | Source: Ambulatory Visit | Attending: Oncology | Admitting: Oncology

## 2010-08-27 ENCOUNTER — Other Ambulatory Visit: Payer: Self-pay | Admitting: Interventional Radiology

## 2010-08-27 DIAGNOSIS — C9110 Chronic lymphocytic leukemia of B-cell type not having achieved remission: Secondary | ICD-10-CM

## 2010-08-27 LAB — PROTIME-INR
INR: 0.99 (ref 0.00–1.49)
Prothrombin Time: 13.3 seconds (ref 11.6–15.2)

## 2010-08-27 LAB — CBC
MCHC: 34.7 g/dL (ref 30.0–36.0)
Platelets: 263 10*3/uL (ref 150–400)
RDW: 13 % (ref 11.5–15.5)
WBC: 7.9 10*3/uL (ref 4.0–10.5)

## 2010-08-27 LAB — GLUCOSE, CAPILLARY: Glucose-Capillary: 134 mg/dL — ABNORMAL HIGH (ref 70–99)

## 2010-09-17 ENCOUNTER — Encounter (INDEPENDENT_AMBULATORY_CARE_PROVIDER_SITE_OTHER): Payer: 59 | Admitting: Ophthalmology

## 2010-09-17 DIAGNOSIS — E11359 Type 2 diabetes mellitus with proliferative diabetic retinopathy without macular edema: Secondary | ICD-10-CM

## 2010-09-17 DIAGNOSIS — H43819 Vitreous degeneration, unspecified eye: Secondary | ICD-10-CM

## 2010-09-22 ENCOUNTER — Telehealth: Payer: Self-pay | Admitting: Family Medicine

## 2010-09-22 NOTE — Telephone Encounter (Signed)
Pt requesting results from recent labs (A1C count and recent blood pressure) Please contact pt.

## 2010-09-22 NOTE — Telephone Encounter (Signed)
Continue your current medications.  Fasting blood sugar daily in the morning.  Office visit next week to review.  All your medications and options for treating her diabetes

## 2010-09-23 NOTE — Telephone Encounter (Signed)
Spoke with patient.

## 2010-10-06 ENCOUNTER — Other Ambulatory Visit: Payer: Self-pay | Admitting: Oncology

## 2010-10-06 ENCOUNTER — Encounter (HOSPITAL_BASED_OUTPATIENT_CLINIC_OR_DEPARTMENT_OTHER): Payer: 59 | Admitting: Oncology

## 2010-10-06 DIAGNOSIS — C8581 Other specified types of non-Hodgkin lymphoma, lymph nodes of head, face, and neck: Secondary | ICD-10-CM

## 2010-10-06 LAB — URIC ACID: Uric Acid, Serum: 5.1 mg/dL (ref 4.0–7.8)

## 2010-10-06 LAB — CBC WITH DIFFERENTIAL/PLATELET
BASO%: 0.5 % (ref 0.0–2.0)
EOS%: 2.2 % (ref 0.0–7.0)
Eosinophils Absolute: 0.2 10*3/uL (ref 0.0–0.5)
LYMPH%: 31 % (ref 14.0–49.0)
MCH: 31 pg (ref 27.2–33.4)
MCHC: 34 g/dL (ref 32.0–36.0)
MCV: 91.3 fL (ref 79.3–98.0)
MONO%: 7.6 % (ref 0.0–14.0)
Platelets: 251 10*3/uL (ref 140–400)
RBC: 4.24 10*6/uL (ref 4.20–5.82)

## 2010-10-06 LAB — COMPREHENSIVE METABOLIC PANEL
Alkaline Phosphatase: 58 U/L (ref 39–117)
Glucose, Bld: 244 mg/dL — ABNORMAL HIGH (ref 70–99)
Sodium: 142 mEq/L (ref 135–145)
Total Bilirubin: 0.8 mg/dL (ref 0.3–1.2)
Total Protein: 7.2 g/dL (ref 6.0–8.3)

## 2010-12-20 ENCOUNTER — Encounter: Payer: Self-pay | Admitting: Oncology

## 2010-12-20 DIAGNOSIS — C9110 Chronic lymphocytic leukemia of B-cell type not having achieved remission: Secondary | ICD-10-CM | POA: Insufficient documentation

## 2010-12-22 ENCOUNTER — Other Ambulatory Visit: Payer: Self-pay | Admitting: *Deleted

## 2010-12-22 DIAGNOSIS — E119 Type 2 diabetes mellitus without complications: Secondary | ICD-10-CM

## 2010-12-22 MED ORDER — GLUCOSE BLOOD VI STRP: ORAL_STRIP | Status: DC

## 2010-12-22 MED ORDER — GLIPIZIDE 10 MG PO TABS: 10.0000 mg | ORAL_TABLET | Freq: Two times a day (BID) | ORAL | Status: DC

## 2010-12-22 MED ORDER — METFORMIN HCL 1000 MG PO TABS: 1000.0000 mg | ORAL_TABLET | Freq: Two times a day (BID) | ORAL | Status: DC

## 2010-12-24 ENCOUNTER — Encounter: Payer: Self-pay | Admitting: *Deleted

## 2010-12-24 ENCOUNTER — Other Ambulatory Visit: Payer: Self-pay | Admitting: *Deleted

## 2010-12-24 DIAGNOSIS — E119 Type 2 diabetes mellitus without complications: Secondary | ICD-10-CM

## 2010-12-24 MED ORDER — GLUCOSE BLOOD VI STRP: ORAL_STRIP | Status: DC

## 2010-12-24 MED ORDER — METFORMIN HCL 1000 MG PO TABS: 1000.0000 mg | ORAL_TABLET | Freq: Two times a day (BID) | ORAL | Status: DC

## 2010-12-24 MED ORDER — GLIPIZIDE 10 MG PO TABS: 10.0000 mg | ORAL_TABLET | Freq: Two times a day (BID) | ORAL | Status: DC

## 2010-12-29 ENCOUNTER — Ambulatory Visit (HOSPITAL_BASED_OUTPATIENT_CLINIC_OR_DEPARTMENT_OTHER): Payer: 59 | Admitting: Oncology

## 2010-12-29 ENCOUNTER — Other Ambulatory Visit: Payer: 59 | Admitting: Lab

## 2010-12-29 VITALS — BP 128/79 | HR 93 | Temp 97.2°F | Ht 68.5 in | Wt 161.3 lb

## 2010-12-29 DIAGNOSIS — C91Z0 Other lymphoid leukemia not having achieved remission: Secondary | ICD-10-CM

## 2010-12-29 DIAGNOSIS — E119 Type 2 diabetes mellitus without complications: Secondary | ICD-10-CM

## 2010-12-29 DIAGNOSIS — C9110 Chronic lymphocytic leukemia of B-cell type not having achieved remission: Secondary | ICD-10-CM

## 2010-12-29 NOTE — Progress Notes (Signed)
[facility] OFFICE PROGRESS NOTE  Tyler ALLEN, MD  DIAGNOSIS:  Small lymphocytic lymphoma; stage III.  CURRENT THERAPY:  Watchful observation.  INTERVAL HISTORY: Tyler Scott 55 y.o. male returns for regular follow up.  He has not noticed much changes to the size and number of the small cervical neck nodes.  He denies nodal swelling anywhere else such as supraclav, axillary, inguinal.  He is working full time without fatigue or performance problem at work.  He denies headache, visual change, mucositis, dysphagia, odynophagia, drenching night sweat, chest pain, palpitation, hemoptysis, hematemesis, abdominal pain, abdominal swelling, melena, hematochezia, hematuria, skin rash, low back pain, bowel or bladder incontinence.  MEDICAL HISTORY: Past Medical History  Diagnosis Date  . DIABETES MELLITUS, TYPE II 11/24/2006  . DIABETES MELLITUS, TYPE I 12/29/2007  . DIABETES MELLITUS, TYPE I, UNCONTROLLED 12/30/2006  . HYPERLIPIDEMIA 12/30/2006  . HYPERTENSION 12/30/2006  . ERECTILE DYSFUNCTION, ORGANIC 12/30/2006  . LOW BACK PAIN, ACUTE 12/21/2006  . Diabetic retinopathy   . CLL (chronic lymphocytic leukemia)     stage III: asymptomatic; on observation    SURGICAL HISTORY: No past surgical history on file.  MEDICATIONS: Current Outpatient Prescriptions  Medication Sig Dispense Refill  . glipiZIDE (GLUCOTROL) 10 MG tablet Take 1 tablet (10 mg total) by mouth 2 (two) times daily before a meal.  200 tablet  3  . glucose blood (ACCU-CHEK COMPACT STRIPS) test strip Use as instructed  100 each  3  . ibuprofen (ADVIL,MOTRIN) 800 MG tablet Take 800 mg by mouth every 6 (six) hours as needed.        . insulin glargine (LANTUS) 100 UNIT/ML injection Inject 25 Units into the skin at bedtime as needed.       . Insulin Pen Needle (PEN NEEDLES) 31G X 6 MM MISC 1 Syringe by Does not apply route 1 dose over 46 hours. Once daily for glucose control  100 each  3  . lisinopril  (PRINIVIL,ZESTRIL) 40 MG tablet Take 1 tablet (40 mg total) by mouth daily.  100 tablet  3  . metFORMIN (GLUCOPHAGE) 1000 MG tablet Take 500 mg by mouth daily.        . simvastatin (ZOCOR) 20 MG tablet Take 1 tablet (20 mg total) by mouth at bedtime.  100 tablet  3  . vardenafil (LEVITRA) 20 MG tablet Take 1 tablet (20 mg total) by mouth daily as needed.  6 tablet  11    ALLERGIES:   has no known allergies.  REVIEW OF SYSTEMS:  The rest of the 14-point review of system was negative.   Filed Vitals:   12/29/10 1443  BP: 128/79  Pulse: 93  Temp: 97.2 F (36.2 C)   Wt Readings from Last 3 Encounters:  12/29/10 161 lb 4.8 oz (73.165 kg)  10/06/10 162 lb 4.8 oz (73.619 kg)  07/10/10 161 lb (73.029 kg)   ECOG Performance status: 0  PHYSICAL EXAMINATION:   General:  well-nourished in no acute distress.  Eyes:  no scleral icterus.  ENT:  There were no oropharyngeal lesions.  Neck was without thyromegaly.  Lymphatics:  Positive for shotty right cervical neck adenopathy about 1x2cm.   Negative for supraclavicular or axillary adenopathy.  Respiratory: lungs were clear bilaterally without wheezing or crackles.  Cardiovascular:  Regular rate and rhythm, S1/S2, without murmur, rub or gallop.  There was no pedal edema.  GI:  abdomen was soft, flat, nontender, nondistended, without organomegaly.  Muscoloskeletal:  no spinal tenderness of palpation of  vertebral spine.  Skin exam was without echymosis, petichae.  Neuro exam was nonfocal.  Patient was able to get on and off exam table without assistance.  Gait was normal.  Patient was alerted and oriented.  Attention was good.   Language was appropriate.  Mood was normal without depression.  Speech was not pressured.  Thought content was not tangential.    LABORATORY/RADIOLOGY DATA:  None.  ASSESSMENT AND PLAN:   1.  Stage III small lymphocytic lymphoma (SLL).  I discussed with [name] that again he has no indication for treatment with  chemotherapy.  He has no B-symptoms, pancytopenia, recurrent infection.  Chemotherapy does not cure CLL.  It can go into remission but with high chance of recurrence.  Therefore, we only start chemo if patients with CLL/SLL have convincing indications for treatment with chemo.  I again recommended watchful observation.   He expressed informed understanding and wished to proceed with stated plan.  2.  Diabetes Mellitus, type II:  he's on insulin with Dr. Tawanna Scott. 3.  Hyperlipidemia:  he is on simvastatin. 4.  Follow up:  Lab only in 3 months.  Lab/RV with me in 6 months.  I advised him to contact us with concerning symptoms.

## 2010-12-31 ENCOUNTER — Telehealth: Payer: Self-pay | Admitting: Oncology

## 2010-12-31 NOTE — Telephone Encounter (Signed)
S/w the pt advising him of his lab appt in feb and his appts for may with the md.

## 2011-02-28 ENCOUNTER — Telehealth: Payer: Self-pay | Admitting: Oncology

## 2011-02-28 NOTE — Telephone Encounter (Signed)
[name] has appt for 2013

## 2011-03-30 ENCOUNTER — Other Ambulatory Visit (HOSPITAL_BASED_OUTPATIENT_CLINIC_OR_DEPARTMENT_OTHER): Payer: 59 | Admitting: Lab

## 2011-03-30 ENCOUNTER — Telehealth: Payer: Self-pay

## 2011-03-30 DIAGNOSIS — C9110 Chronic lymphocytic leukemia of B-cell type not having achieved remission: Secondary | ICD-10-CM

## 2011-03-30 LAB — COMPREHENSIVE METABOLIC PANEL
BUN: 18 mg/dL (ref 6–23)
CO2: 27 mEq/L (ref 19–32)
Calcium: 9.2 mg/dL (ref 8.4–10.5)
Chloride: 101 mEq/L (ref 96–112)
Creatinine, Ser: 1.04 mg/dL (ref 0.50–1.35)

## 2011-03-30 LAB — CBC WITH DIFFERENTIAL/PLATELET
Basophils Absolute: 0 10*3/uL (ref 0.0–0.1)
HCT: 36.2 % — ABNORMAL LOW (ref 38.4–49.9)
HGB: 12.3 g/dL — ABNORMAL LOW (ref 13.0–17.1)
MCH: 31 pg (ref 27.2–33.4)
MONO#: 0.6 10*3/uL (ref 0.1–0.9)
NEUT%: 45.9 % (ref 39.0–75.0)
WBC: 11.4 10*3/uL — ABNORMAL HIGH (ref 4.0–10.3)
lymph#: 5.1 10*3/uL — ABNORMAL HIGH (ref 0.9–3.3)

## 2011-03-30 LAB — LACTATE DEHYDROGENASE: LDH: 100 U/L (ref 94–250)

## 2011-03-30 NOTE — Telephone Encounter (Signed)
Message copied by Kallie Locks on Mon Mar 30, 2011  3:57 PM ------      Message from: HA, Raliegh Ip T      Created: Mon Mar 30, 2011  2:08 PM       Please call pt.  His WBC/lymphoytes from CLL have slightly increased.  Did he change his mind about CT and starting treatment?  Any concerning symptoms.  His next appointment with me is in May 2013.  But if he has symptoms or changes his mind about treatment, I'll see him sooner.  Thanks.

## 2011-04-19 ENCOUNTER — Other Ambulatory Visit: Payer: Self-pay | Admitting: Family Medicine

## 2011-05-13 ENCOUNTER — Other Ambulatory Visit: Payer: Self-pay | Admitting: Oncology

## 2011-06-26 ENCOUNTER — Telehealth: Payer: Self-pay | Admitting: *Deleted

## 2011-06-26 ENCOUNTER — Other Ambulatory Visit (HOSPITAL_BASED_OUTPATIENT_CLINIC_OR_DEPARTMENT_OTHER): Payer: 59 | Admitting: Lab

## 2011-06-26 ENCOUNTER — Ambulatory Visit (HOSPITAL_BASED_OUTPATIENT_CLINIC_OR_DEPARTMENT_OTHER): Payer: 59 | Admitting: Oncology

## 2011-06-26 VITALS — BP 114/68 | HR 86 | Temp 96.8°F | Ht 68.5 in | Wt 163.0 lb

## 2011-06-26 DIAGNOSIS — E119 Type 2 diabetes mellitus without complications: Secondary | ICD-10-CM

## 2011-06-26 DIAGNOSIS — C9110 Chronic lymphocytic leukemia of B-cell type not having achieved remission: Secondary | ICD-10-CM

## 2011-06-26 DIAGNOSIS — C91Z0 Other lymphoid leukemia not having achieved remission: Secondary | ICD-10-CM

## 2011-06-26 DIAGNOSIS — E785 Hyperlipidemia, unspecified: Secondary | ICD-10-CM

## 2011-06-26 DIAGNOSIS — R161 Splenomegaly, not elsewhere classified: Secondary | ICD-10-CM

## 2011-06-26 LAB — CBC WITH DIFFERENTIAL/PLATELET
BASO%: 0.5 % (ref 0.0–2.0)
EOS%: 1.6 % (ref 0.0–7.0)
HCT: 38.5 % (ref 38.4–49.9)
MCH: 31 pg (ref 27.2–33.4)
MCHC: 33.4 g/dL (ref 32.0–36.0)
MONO%: 6.8 % (ref 0.0–14.0)
NEUT%: 51.4 % (ref 39.0–75.0)
RDW: 13.7 % (ref 11.0–14.6)
lymph#: 3.7 10*3/uL — ABNORMAL HIGH (ref 0.9–3.3)

## 2011-06-26 LAB — COMPREHENSIVE METABOLIC PANEL
ALT: 16 U/L (ref 0–53)
AST: 20 U/L (ref 0–37)
Albumin: 4.6 g/dL (ref 3.5–5.2)
Calcium: 9.5 mg/dL (ref 8.4–10.5)
Chloride: 104 mEq/L (ref 96–112)
Potassium: 5.1 mEq/L (ref 3.5–5.3)

## 2011-06-26 LAB — LACTATE DEHYDROGENASE: LDH: 119 U/L (ref 94–250)

## 2011-06-26 NOTE — Telephone Encounter (Signed)
gave patient appointment on 09-14-2011 11-26-2011 printed out calendar and gave to the patient

## 2011-06-26 NOTE — Progress Notes (Signed)
[facility]  Telephone:([phone]) [phone] Fax:([phone]   OFFICE PROGRESS NOTE   Cc:  Tyler Georges, MD, MD  DIAGNOSIS: Small lymphocytic lymphoma; stage III.   CURRENT THERAPY: Watchful observation.  INTERVAL HISTORY: Tyler Scott 56 y.o. male returns for regular follow up.  He recently developed diffused body rash and was given a course of Prednisone taper.  He noticed significant improvement of his adenopathy.  He can hardly palpate his neck nodes now.  He felt great while being on Prednisone.  He is off of Prednisone now.  His rash has completely resolved.  He reports feeling well.  He still works full time without problem.    Patient denies fatigue, headache, visual changes, confusion, drenching night sweats, palpable lymph node swelling, mucositis, odynophagia, dysphagia, nausea vomiting, jaundice, chest pain, palpitation, shortness of breath, dyspnea on exertion, productive cough, gum bleeding, epistaxis, hematemesis, hemoptysis, abdominal pain, abdominal swelling, early satiety, melena, hematochezia, hematuria, skin rash, spontaneous bleeding, joint swelling, joint pain, heat or cold intolerance, bowel bladder incontinence, back pain, focal motor weakness, paresthesia, depression, suicidal or homocidal ideation, feeling hopelessness.   Past Medical History  Diagnosis Date  . DIABETES MELLITUS, TYPE II 11/24/2006  . DIABETES MELLITUS, TYPE I 12/29/2007  . DIABETES MELLITUS, TYPE I, UNCONTROLLED 12/30/2006  . HYPERLIPIDEMIA 12/30/2006  . HYPERTENSION 12/30/2006  . ERECTILE DYSFUNCTION, ORGANIC 12/30/2006  . LOW BACK PAIN, ACUTE 12/21/2006  . Diabetic retinopathy   . CLL (chronic lymphocytic leukemia)     stage III: asymptomatic; on observation    No past surgical history on file.  Current Outpatient Prescriptions  Medication Sig Dispense Refill  . B-D UF III MINI PEN NEEDLES 31G X 5 MM MISC USE ONCE DAILY WITH LANTUS FOR GLUCOSE CONTOL  100  each  1  . glucose blood (ACCU-CHEK COMPACT STRIPS) test strip Use as instructed  100 each  3  . ibuprofen (ADVIL,MOTRIN) 800 MG tablet Take 800 mg by mouth every 6 (six) hours as needed.        . insulin glargine (LANTUS) 100 UNIT/ML injection Inject 25 Units into the skin at bedtime as needed.       . Insulin Pen Needle (PEN NEEDLES) 31G X 6 MM MISC 1 Syringe by Does not apply route 1 dose over 46 hours. Once daily for glucose control  100 each  3  . LEVITRA 20 MG tablet TAKE 1 TABLET BY MOUTH EVERY DAY AS NEEDED  6 tablet  1  . lisinopril (PRINIVIL,ZESTRIL) 40 MG tablet Take 1 tablet (40 mg total) by mouth daily.  100 tablet  3  . simvastatin (ZOCOR) 20 MG tablet Take 1 tablet (20 mg total) by mouth at bedtime.  100 tablet  3    ALLERGIES:   has no known allergies.  REVIEW OF SYSTEMS:  The rest of the 14-point review of system was negative.   Filed Vitals:   06/26/11 1333  BP: 114/68  Pulse: 86  Temp: 96.8 F (36 C)   Wt Readings from Last 3 Encounters:  06/26/11 163 lb (73.936 kg)  12/29/10 161 lb 4.8 oz (73.165 kg)  10/06/10 162 lb 4.8 oz (73.619 kg)   ECOG Performance status: 0  PHYSICAL EXAMINATION:   General:  well-nourished in no acute distress.  Eyes:  no scleral icterus.  ENT:  There were no oropharyngeal lesions.  Neck was without thyromegaly.  Lymphatics:  Shotty cervical, supraclavicular or axillary adenopathy.  The largest one was about 2cm in the left  cervical area.   Respiratory: lungs were clear bilaterally without wheezing or crackles.  Cardiovascular:  Regular rate and rhythm, S1/S2, without murmur, rub or gallop.  There was no pedal edema.  GI:  abdomen was soft, flat, nontender, nondistended, without hepatomegaly but positive for splenomegaly about 3cm below costal margin.  Muscoloskeletal:  no spinal tenderness of palpation of vertebral spine.  Skin exam was without echymosis, petichae.  Neuro exam was nonfocal.  Patient was able to get on and off exam table  without assistance.  Gait was normal.  Patient was alerted and oriented.  Attention was good.   Language was appropriate.  Mood was normal without depression.  Speech was not pressured.  Thought content was not tangential.     LABORATORY/RADIOLOGY DATA:  Lab Results  Component Value Date   WBC 9.4 06/26/2011   HGB 12.8* 06/26/2011   HCT 38.5 06/26/2011   PLT 299 06/26/2011   GLUCOSE 216* 03/30/2011   CHOL 129 07/09/2009   TRIG 35.0 07/09/2009   HDL 44.10 07/09/2009   LDLCALC 78 07/09/2009   ALKPHOS 61 03/30/2011   ALT 16 03/30/2011   AST 15 03/30/2011   NA 138 03/30/2011   K 4.7 03/30/2011   CL 101 03/30/2011   CREATININE 1.04 03/30/2011   BUN 18 03/30/2011   CO2 27 03/30/2011   PSA 1.10 07/09/2009   INR 0.99 08/27/2010   HGBA1C 7.6* 08/21/2010   MICROALBUR 0.7 08/21/2010    ASSESSMENT AND PLAN:   1. Stage III small lymphocytic lymphoma (SLL given diffuse adenopathy and splenomegaly).  Tyler Scott still has no indication for treatment with chemotherapy. He has no B-symptoms, pancytopenia, recurrent infection. I explained to Tyler Scott that chemotherapy in CLL only result in temporary remission; it does not cure CLL. It can go into remission but with high chance of recurrence in certain patients. Therefore, we only start chemo if patients with CLL/SLL have definitive indications for treatment with chemo. I again recommended watchful observation. He expressed informed understanding and wished to proceed with stated plan.   2. Diabetes Mellitus, type II: he's on insulin with Dr. Tawanna Scott.   3. Hyperlipidemia: he is on simvastatin per PCP.   4. Follow up: Lab only in 3 months. Lab/RV with [place] in 5-6 months. I advised him to contact us with concerning symptoms.   5.  Skin rash:  Unclear etiology; but resolved now.  Rarely dose CLL present with diffuse skin rash.  In the future, if he has diffuse skin rash again; I may consider skin biopsy to rule out cutaneous manifestation of his disease.    The  length of time of the face-to-face encounter was . More than 50% of time was spent counseling and coordination of care.

## 2011-06-30 ENCOUNTER — Encounter (INDEPENDENT_AMBULATORY_CARE_PROVIDER_SITE_OTHER): Payer: 59 | Admitting: Ophthalmology

## 2011-06-30 DIAGNOSIS — E11359 Type 2 diabetes mellitus with proliferative diabetic retinopathy without macular edema: Secondary | ICD-10-CM

## 2011-06-30 DIAGNOSIS — H43819 Vitreous degeneration, unspecified eye: Secondary | ICD-10-CM

## 2011-06-30 DIAGNOSIS — E1139 Type 2 diabetes mellitus with other diabetic ophthalmic complication: Secondary | ICD-10-CM

## 2011-06-30 DIAGNOSIS — H2510 Age-related nuclear cataract, unspecified eye: Secondary | ICD-10-CM

## 2011-06-30 DIAGNOSIS — E1165 Type 2 diabetes mellitus with hyperglycemia: Secondary | ICD-10-CM

## 2011-07-10 ENCOUNTER — Other Ambulatory Visit (INDEPENDENT_AMBULATORY_CARE_PROVIDER_SITE_OTHER): Payer: 59 | Admitting: Ophthalmology

## 2011-07-10 DIAGNOSIS — H35059 Retinal neovascularization, unspecified, unspecified eye: Secondary | ICD-10-CM

## 2011-07-15 ENCOUNTER — Other Ambulatory Visit: Payer: Self-pay | Admitting: Family Medicine

## 2011-09-14 ENCOUNTER — Other Ambulatory Visit (HOSPITAL_BASED_OUTPATIENT_CLINIC_OR_DEPARTMENT_OTHER): Payer: 59

## 2011-09-14 ENCOUNTER — Telehealth: Payer: Self-pay | Admitting: *Deleted

## 2011-09-14 DIAGNOSIS — C9110 Chronic lymphocytic leukemia of B-cell type not having achieved remission: Secondary | ICD-10-CM

## 2011-09-14 LAB — CBC WITH DIFFERENTIAL/PLATELET
BASO%: 0.4 % (ref 0.0–2.0)
EOS%: 2.8 % (ref 0.0–7.0)
MCH: 31.1 pg (ref 27.2–33.4)
MCV: 93.1 fL (ref 79.3–98.0)
MONO%: 7.3 % (ref 0.0–14.0)
RBC: 3.99 10*6/uL — ABNORMAL LOW (ref 4.20–5.82)
RDW: 13.3 % (ref 11.0–14.6)
lymph#: 3.4 10*3/uL — ABNORMAL HIGH (ref 0.9–3.3)

## 2011-09-14 NOTE — Telephone Encounter (Signed)
Message copied by Wende Mott on Mon Sep 14, 2011  4:17 PM ------      Message from: HA, Raliegh Ip T      Created: Mon Sep 14, 2011 12:05 PM       Please call pt.  His lymphoma (CLL) is stable.  Continue watchful observation.  Thanks.

## 2011-09-14 NOTE — Telephone Encounter (Signed)
Called pt and informed of lab/ CLL stable per Dr. Gaylyn Rong.  Keep next appt in October for lab and office visit as scheduled.  Pt verbalized understanding.

## 2011-09-21 ENCOUNTER — Other Ambulatory Visit: Payer: Self-pay | Admitting: Family Medicine

## 2011-11-13 ENCOUNTER — Ambulatory Visit (INDEPENDENT_AMBULATORY_CARE_PROVIDER_SITE_OTHER): Payer: 59 | Admitting: Ophthalmology

## 2011-11-13 DIAGNOSIS — E11359 Type 2 diabetes mellitus with proliferative diabetic retinopathy without macular edema: Secondary | ICD-10-CM

## 2011-11-13 DIAGNOSIS — I10 Essential (primary) hypertension: Secondary | ICD-10-CM

## 2011-11-13 DIAGNOSIS — H2510 Age-related nuclear cataract, unspecified eye: Secondary | ICD-10-CM

## 2011-11-13 DIAGNOSIS — H43819 Vitreous degeneration, unspecified eye: Secondary | ICD-10-CM

## 2011-11-13 DIAGNOSIS — E1139 Type 2 diabetes mellitus with other diabetic ophthalmic complication: Secondary | ICD-10-CM

## 2011-11-13 DIAGNOSIS — H35039 Hypertensive retinopathy, unspecified eye: Secondary | ICD-10-CM

## 2011-11-14 ENCOUNTER — Other Ambulatory Visit: Payer: Self-pay | Admitting: Family Medicine

## 2011-11-26 ENCOUNTER — Other Ambulatory Visit (HOSPITAL_BASED_OUTPATIENT_CLINIC_OR_DEPARTMENT_OTHER): Payer: 59 | Admitting: Lab

## 2011-11-26 ENCOUNTER — Ambulatory Visit (HOSPITAL_BASED_OUTPATIENT_CLINIC_OR_DEPARTMENT_OTHER): Payer: 59 | Admitting: Oncology

## 2011-11-26 ENCOUNTER — Encounter: Payer: Self-pay | Admitting: Oncology

## 2011-11-26 ENCOUNTER — Telehealth: Payer: Self-pay | Admitting: Oncology

## 2011-11-26 VITALS — BP 164/85 | HR 81 | Temp 96.9°F | Resp 20 | Ht 68.5 in | Wt 162.6 lb

## 2011-11-26 DIAGNOSIS — C9110 Chronic lymphocytic leukemia of B-cell type not having achieved remission: Secondary | ICD-10-CM

## 2011-11-26 DIAGNOSIS — C8599 Non-Hodgkin lymphoma, unspecified, extranodal and solid organ sites: Secondary | ICD-10-CM

## 2011-11-26 LAB — COMPREHENSIVE METABOLIC PANEL (CC13)
ALT: 22 U/L (ref 0–55)
AST: 21 U/L (ref 5–34)
Albumin: 4.3 g/dL (ref 3.5–5.0)
Alkaline Phosphatase: 68 U/L (ref 40–150)
Glucose: 186 mg/dl — ABNORMAL HIGH (ref 70–99)
Potassium: 5.1 mEq/L (ref 3.5–5.1)
Sodium: 135 mEq/L — ABNORMAL LOW (ref 136–145)
Total Bilirubin: 1.4 mg/dL — ABNORMAL HIGH (ref 0.20–1.20)
Total Protein: 7.1 g/dL (ref 6.4–8.3)

## 2011-11-26 LAB — CBC WITH DIFFERENTIAL/PLATELET
BASO%: 0.6 % (ref 0.0–2.0)
Basophils Absolute: 0.1 10*3/uL (ref 0.0–0.1)
EOS%: 2.3 % (ref 0.0–7.0)
HGB: 13.1 g/dL (ref 13.0–17.1)
MCH: 30.8 pg (ref 27.2–33.4)
RBC: 4.26 10*6/uL (ref 4.20–5.82)
RDW: 13.1 % (ref 11.0–14.6)
lymph#: 4.6 10*3/uL — ABNORMAL HIGH (ref 0.9–3.3)

## 2011-11-26 LAB — LACTATE DEHYDROGENASE (CC13): LDH: 136 U/L (ref 125–220)

## 2011-11-26 NOTE — Telephone Encounter (Signed)
Gave pt appt for lab on January 2014 then MD visit and lab on March 2014

## 2011-11-26 NOTE — Progress Notes (Signed)
[facility]  Telephone:(336) [phone] Fax:(336) [phone]   OFFICE PROGRESS NOTE   Cc:  TylerJEFFREY ALLEN, MD  DIAGNOSIS: Small lymphocytic lymphoma; stage III.   CURRENT THERAPY: Watchful observation.  INTERVAL HISTORY: Tyler Scott 57 y.o. male returns for regular follow up. He does not have any increase in fatigue. No weight loss or decrease in appetite. He has night sweats which have been present several years; no change in intensity.  He can palpate a lymph node in the right side of his neck that is unchanged. He reports feeling well.  He still works full time without problem.    Patient denies fatigue, headache, visual changes, confusion, mucositis, odynophagia, dysphagia, nausea vomiting, jaundice, chest pain, palpitation, shortness of breath, dyspnea on exertion, productive cough, gum bleeding, epistaxis, hematemesis, hemoptysis, abdominal pain, abdominal swelling, early satiety, melena, hematochezia, hematuria, skin rash, spontaneous bleeding, joint swelling, joint pain, heat or cold intolerance, bowel bladder incontinence, back pain, focal motor weakness, paresthesia, depression, suicidal or homocidal ideation, feeling hopelessness.   Past Medical History  Diagnosis Date  . DIABETES MELLITUS, TYPE II 11/24/2006  . DIABETES MELLITUS, TYPE I 12/29/2007  . DIABETES MELLITUS, TYPE I, UNCONTROLLED 12/30/2006  . HYPERLIPIDEMIA 12/30/2006  . HYPERTENSION 12/30/2006  . ERECTILE DYSFUNCTION, ORGANIC 12/30/2006  . LOW BACK PAIN, ACUTE 12/21/2006  . Diabetic retinopathy(362.0)   . CLL (chronic lymphocytic leukemia)     stage III: asymptomatic; on observation    History reviewed. No pertinent past surgical history.  Current Outpatient Prescriptions  Medication Sig Dispense Refill  . B-D UF III MINI PEN NEEDLES 31G X 5 MM MISC USE WITH LANTUS SOLOSTAR   DAILY  90 each  3  . glucose blood (ACCU-CHEK COMPACT STRIPS) test strip Use as instructed  100 each  3    . ibuprofen (ADVIL,MOTRIN) 800 MG tablet Take 800 mg by mouth every 6 (six) hours as needed.        . Insulin Pen Needle (PEN NEEDLES) 31G X 6 MM MISC 1 Syringe by Does not apply route 1 dose over 46 hours. Once daily for glucose control  100 each  3  . LANTUS SOLOSTAR 100 UNIT/ML injection INJECT 15 UNITS INTO THE   SKIN AT BEDTIME  15 mL  3  . LEVITRA 20 MG tablet TAKE 1 TABLET BY MOUTH EVERY DAY AS NEEDED *TIME FOR AN OFFICE VISIT*  3 tablet  0  . lisinopril (PRINIVIL,ZESTRIL) 40 MG tablet TAKE 1 TABLET DAILY  90 tablet  3  . simvastatin (ZOCOR) 20 MG tablet Take 1 tablet (20 mg total) by mouth at bedtime.  100 tablet  3    ALLERGIES:   has no known allergies.  REVIEW OF SYSTEMS:  The rest of the 14-point review of system was negative.   There were no vitals filed for this visit. Wt Readings from Last 3 Encounters:  06/26/11 163 lb (73.936 kg)  12/29/10 161 lb 4.8 oz (73.165 kg)  10/06/10 162 lb 4.8 oz (73.619 kg)   ECOG Performance status: 0  PHYSICAL EXAMINATION:   General:  well-nourished in no acute distress.  Eyes:  no scleral icterus.  ENT:  There were no oropharyngeal lesions.  Neck was without thyromegaly.  Lymphatics:  Shotty cervical, supraclavicular or axillary adenopathy.  The largest one was about 2cm in the right cervical area.   Respiratory: lungs were clear bilaterally without wheezing or crackles.  Cardiovascular:  Regular rate and rhythm, S1/S2, without murmur, rub or gallop.  There  was no pedal edema.  GI:  abdomen was soft, flat, nontender, nondistended, without hepatomegaly but positive for splenomegaly about 3cm below costal margin.  Muscoloskeletal:  no spinal tenderness of palpation of vertebral spine.  Skin exam was without echymosis, petichae.  Neuro exam was nonfocal.  Patient was able to get on and off exam table without assistance.  Gait was normal.  Patient was alerted and oriented.  Attention was good.   Language was appropriate.  Mood was normal without  depression.  Speech was not pressured.  Thought content was not tangential.     LABORATORY/RADIOLOGY DATA:  Lab Results  Component Value Date   WBC 10.7* 11/26/2011   HGB 13.1 11/26/2011   HCT 39.2 11/26/2011   PLT 264 11/26/2011   GLUCOSE 109* 06/26/2011   CHOL 129 07/09/2009   TRIG 35.0 07/09/2009   HDL 44.10 07/09/2009   LDLCALC 78 07/09/2009   ALKPHOS 58 06/26/2011   ALT 16 06/26/2011   AST 20 06/26/2011   NA 139 06/26/2011   K 5.1 06/26/2011   CL 104 06/26/2011   CREATININE 0.94 06/26/2011   BUN 13 06/26/2011   CO2 28 06/26/2011   PSA 1.10 07/09/2009   INR 0.99 08/27/2010   HGBA1C 7.6* 08/21/2010   MICROALBUR 0.7 08/21/2010    ASSESSMENT AND PLAN:   1. Stage III small lymphocytic lymphoma (SLL given diffuse adenopathy and splenomegaly).  [name] still has no indication for treatment with chemotherapy. He has no B-symptoms, pancytopenia, recurrent infection. I explained to [name] that chemotherapy in CLL only result in temporary remission; it does not cure CLL. It can go into remission but with high chance of recurrence in certain patients. Therefore, we only start chemo if patients with CLL/SLL have definitive indications for treatment with chemo. I again recommended watchful observation. He expressed informed understanding and wished to proceed with stated plan.   2. Diabetes Mellitus, type II: he's on insulin with Dr. Tawanna Scott.   3. Hyperlipidemia: he is on simvastatin per PCP.   4. Follow up: Lab only in 3 months. Lab/RV with Tyler Scott in 5-6 months. I advised him to contact Tyler Scott with concerning symptoms.     The length of time of the face-to-face encounter was 15 minutes. More than 50% of time was spent counseling and coordination of care.

## 2012-02-15 ENCOUNTER — Other Ambulatory Visit: Payer: Self-pay | Admitting: Family Medicine

## 2012-02-26 ENCOUNTER — Other Ambulatory Visit (HOSPITAL_BASED_OUTPATIENT_CLINIC_OR_DEPARTMENT_OTHER): Payer: 59 | Admitting: Lab

## 2012-02-26 ENCOUNTER — Telehealth: Payer: Self-pay | Admitting: *Deleted

## 2012-02-26 DIAGNOSIS — C9110 Chronic lymphocytic leukemia of B-cell type not having achieved remission: Secondary | ICD-10-CM

## 2012-02-26 LAB — CBC WITH DIFFERENTIAL/PLATELET
EOS%: 2.5 % (ref 0.0–7.0)
LYMPH%: 41 % (ref 14.0–49.0)
MCH: 30.8 pg (ref 27.2–33.4)
MCV: 92 fL (ref 79.3–98.0)
MONO%: 6.6 % (ref 0.0–14.0)
Platelets: 274 10*3/uL (ref 140–400)
RBC: 4.27 10*6/uL (ref 4.20–5.82)
RDW: 13.8 % (ref 11.0–14.6)

## 2012-02-26 NOTE — Telephone Encounter (Signed)
Called pt w/ lab results wnl and keep next appt on 04/25/12 as scheduled. He verbalized understanding.

## 2012-02-26 NOTE — Telephone Encounter (Signed)
Message copied by Wende Mott on Fri Feb 26, 2012  3:03 PM ------      Message from: Clenton Pare R      Created: Fri Feb 26, 2012  1:24 PM       Please call patient. WBC is normal. Continue observation.

## 2012-04-19 ENCOUNTER — Other Ambulatory Visit: Payer: Self-pay | Admitting: Internal Medicine

## 2012-04-25 ENCOUNTER — Encounter: Payer: Self-pay | Admitting: Oncology

## 2012-04-25 ENCOUNTER — Ambulatory Visit (HOSPITAL_BASED_OUTPATIENT_CLINIC_OR_DEPARTMENT_OTHER): Payer: 59 | Admitting: Oncology

## 2012-04-25 ENCOUNTER — Other Ambulatory Visit (HOSPITAL_BASED_OUTPATIENT_CLINIC_OR_DEPARTMENT_OTHER): Payer: 59

## 2012-04-25 VITALS — BP 165/87 | HR 88 | Temp 97.0°F | Resp 20 | Ht 68.5 in | Wt 164.4 lb

## 2012-04-25 DIAGNOSIS — C9110 Chronic lymphocytic leukemia of B-cell type not having achieved remission: Secondary | ICD-10-CM

## 2012-04-25 DIAGNOSIS — C8599 Non-Hodgkin lymphoma, unspecified, extranodal and solid organ sites: Secondary | ICD-10-CM

## 2012-04-25 LAB — CBC WITH DIFFERENTIAL/PLATELET
Basophils Absolute: 0 10*3/uL (ref 0.0–0.1)
Eosinophils Absolute: 0.3 10*3/uL (ref 0.0–0.5)
HGB: 11.9 g/dL — ABNORMAL LOW (ref 13.0–17.1)
MCV: 91.8 fL (ref 79.3–98.0)
MONO#: 0.7 10*3/uL (ref 0.1–0.9)
MONO%: 5.7 % (ref 0.0–14.0)
NEUT#: 5.5 10*3/uL (ref 1.5–6.5)
RDW: 13.4 % (ref 11.0–14.6)
WBC: 12.9 10*3/uL — ABNORMAL HIGH (ref 4.0–10.3)

## 2012-04-25 LAB — LACTATE DEHYDROGENASE (CC13): LDH: 137 U/L (ref 125–245)

## 2012-04-25 LAB — COMPREHENSIVE METABOLIC PANEL (CC13)
Albumin: 4.1 g/dL (ref 3.5–5.0)
Alkaline Phosphatase: 75 U/L (ref 40–150)
BUN: 15.5 mg/dL (ref 7.0–26.0)
CO2: 28 mEq/L (ref 22–29)
Calcium: 9.7 mg/dL (ref 8.4–10.4)
Chloride: 102 mEq/L (ref 98–107)
Glucose: 170 mg/dl — ABNORMAL HIGH (ref 70–99)
Potassium: 4.1 mEq/L (ref 3.5–5.1)
Total Protein: 7.5 g/dL (ref 6.4–8.3)

## 2012-04-25 LAB — TECHNOLOGIST REVIEW

## 2012-04-25 NOTE — Patient Instructions (Addendum)
1.  Diagnosis:  Small lymphocytic lymphoma/chronic lymphocytic leukemia CLL) 2.  Recommendation:  Still no indication for treatment.  Indications for treatment include:  Severe constitutional symptoms, severe anemia/low platelet count, recurrent infection, or very bulky nodes.  3.  Follow up:  In about 5-6 months.

## 2012-04-25 NOTE — Progress Notes (Signed)
[facility]  Telephone:(336) [phone] [phone]   OFFICE PROGRESS NOTE   Cc:  TODD,JEFFREY ALLEN, MD  DIAGNOSIS: Small lymphocytic lymphoma; stage III.   CURRENT THERAPY: Watchful observation.  INTERVAL HISTORY: Tyler Scott 57 y.o. male returns for regular follow up by himself.  He can still feel some cervical adenopathy right more than left.  It has not grown much since last visit.  He denied palpable adenopathy elsewhere.  He still works full time.  He denies fever, anorexia, weight loss, fatigue, headache, visual changes, confusion, drenching night sweats, mucositis, odynophagia, dysphagia, nausea vomiting, jaundice, chest pain, palpitation, shortness of breath, dyspnea on exertion, productive cough, gum bleeding, epistaxis, hematemesis, hemoptysis, abdominal pain, abdominal swelling, early satiety, melena, hematochezia, hematuria, skin rash, spontaneous bleeding, joint swelling, joint pain, heat or cold intolerance, bowel bladder incontinence, back pain, focal motor weakness, paresthesia, depression.     Past Medical History  Diagnosis Date  . DIABETES MELLITUS, TYPE I, UNCONTROLLED 12/30/2006  . HYPERLIPIDEMIA 12/30/2006  . HYPERTENSION 12/30/2006  . ERECTILE DYSFUNCTION, ORGANIC 12/30/2006  . LOW BACK PAIN, ACUTE 12/21/2006  . Diabetic retinopathy(362.0)   . CLL (chronic lymphocytic leukemia)     stage III: asymptomatic; on observation    No past surgical history on file.  Current Outpatient Prescriptions  Medication Sig Dispense Refill  . B-D UF III MINI PEN NEEDLES 31G X 5 MM MISC USE WITH LANTUS SOLOSTAR   DAILY  90 each  3  . glucose blood (ACCU-CHEK COMPACT STRIPS) test strip Use as instructed  100 each  3  . ibuprofen (ADVIL,MOTRIN) 800 MG tablet Take 800 mg by mouth every 6 (six) hours as needed.        . Insulin Pen Needle (PEN NEEDLES) 31G X 6 MM MISC 1 Syringe by Does not apply route 1 dose over 46 hours. Once daily for glucose  control  100 each  3  . LANTUS SOLOSTAR 100 UNIT/ML injection INJECT 15 UNITS INTO THE   SKIN AT BEDTIME  15 mL  3  . LEVITRA 20 MG tablet TAKE 1 TABLET BY MOUTH EVERY DAY AS NEEDED *TIME FOR AN OFFICE VISIT*  3 tablet  0  . lisinopril (PRINIVIL,ZESTRIL) 40 MG tablet TAKE 1 TABLET DAILY  90 tablet  3  . simvastatin (ZOCOR) 20 MG tablet Take 1 tablet (20 mg total) by mouth at bedtime.  100 tablet  3   No current facility-administered medications for this visit.    ALLERGIES:  has No Known Allergies.  REVIEW OF SYSTEMS:  The rest of the 14-point review of system was negative.   Filed Vitals:   04/25/12 1030  BP: 165/87  Pulse: 88  Temp: 97 F (36.1 C)  Resp: 20   Wt Readings from Last 3 Encounters:  04/25/12 164 lb 6.4 oz (74.571 kg)  11/26/11 162 lb 9.6 oz (73.755 kg)  06/26/11 163 lb (73.936 kg)   ECOG Performance status: 0  PHYSICAL EXAMINATION:   General:  well-nourished man, in no acute distress.  Eyes:  no scleral icterus.  ENT:  There were no oropharyngeal lesions.  Neck was without thyromegaly.  Lymphatics:  Shotty cervical, supraclavicular or axillary adenopathy.  The largest one was about 2cm in the left cervical area.   Respiratory: lungs were clear bilaterally without wheezing or crackles.  Cardiovascular:  Regular rate and rhythm, S1/S2, without murmur, rub or gallop.  There was no pedal edema.  GI:  abdomen was soft, flat, nontender, nondistended, without  hepatomegaly but positive for splenomegaly.  His rectus muscle was strong making it difficult for me to feel the exact edge of the spleen; however, it was approximately 5cm below the costal margin .  Muscoloskeletal:  no spinal tenderness of palpation of vertebral spine.  Skin exam was without echymosis, petichae.  Neuro exam was nonfocal.  Patient was able to get on and off exam table without assistance.  Gait was normal.  Patient was alert and oriented.  Attention was good.   Language was appropriate.  Mood was normal  without depression.  Speech was not pressured.  Thought content was not tangential.     LABORATORY/RADIOLOGY DATA:  Lab Results  Component Value Date   WBC 12.9* 04/25/2012   HGB 11.9* 04/25/2012   HCT 35.4* 04/25/2012   PLT 290 04/25/2012   GLUCOSE 170* 04/25/2012   CHOL 129 07/09/2009   TRIG 35.0 07/09/2009   HDL 44.10 07/09/2009   LDLCALC 78 07/09/2009   ALKPHOS 75 04/25/2012   ALT 17 04/25/2012   AST 17 04/25/2012   NA 139 04/25/2012   K 4.1 04/25/2012   CL 102 04/25/2012   CREATININE 1.1 04/25/2012   BUN 15.5 04/25/2012   CO2 28 04/25/2012   PSA 1.10 07/09/2009   INR 0.99 08/27/2010   HGBA1C 7.6* 08/21/2010   MICROALBUR 0.7 08/21/2010    ASSESSMENT AND PLAN:   1. Stage III small lymphocytic lymphoma (SLL given diffuse adenopathy and splenomegaly).   -Status: Still no indication for treatment.  Indications for treatment include:  Severe constitutional symptoms, severe anemia/low platelet count, recurrent infection, or very bulky nodes.  -I again recommended watchful observation. He expressed informed understanding and wished to proceed with stated plan.   2. Diabetes Mellitus, type II: he's on insulin with Dr. Tawanna Cooler.   3. Hyperlipidemia: he is on simvastatin per PCP.   4.  HTN:  On Lisinopril.   5. Follow up:  Lab and return visit with the Cancer Center in about 5-6 months. I advised him to contact us with concerning symptoms in the interval.     The length of time of the face-to-face encounter was 15 minutes. More than 50% of time was spent counseling and coordination of care.

## 2012-04-26 ENCOUNTER — Telehealth: Payer: Self-pay | Admitting: Oncology

## 2012-04-26 NOTE — Telephone Encounter (Signed)
s.w. pt and advised on Aug appt.

## 2012-05-13 ENCOUNTER — Ambulatory Visit (INDEPENDENT_AMBULATORY_CARE_PROVIDER_SITE_OTHER): Payer: 59 | Admitting: Ophthalmology

## 2012-05-13 DIAGNOSIS — E1165 Type 2 diabetes mellitus with hyperglycemia: Secondary | ICD-10-CM

## 2012-05-13 DIAGNOSIS — E1139 Type 2 diabetes mellitus with other diabetic ophthalmic complication: Secondary | ICD-10-CM

## 2012-05-13 DIAGNOSIS — E11319 Type 2 diabetes mellitus with unspecified diabetic retinopathy without macular edema: Secondary | ICD-10-CM

## 2012-05-13 DIAGNOSIS — H35039 Hypertensive retinopathy, unspecified eye: Secondary | ICD-10-CM

## 2012-05-13 DIAGNOSIS — I10 Essential (primary) hypertension: Secondary | ICD-10-CM

## 2012-05-13 DIAGNOSIS — H2510 Age-related nuclear cataract, unspecified eye: Secondary | ICD-10-CM

## 2012-05-13 DIAGNOSIS — H43819 Vitreous degeneration, unspecified eye: Secondary | ICD-10-CM

## 2012-05-19 ENCOUNTER — Encounter: Payer: Self-pay | Admitting: Family Medicine

## 2012-05-19 ENCOUNTER — Ambulatory Visit (INDEPENDENT_AMBULATORY_CARE_PROVIDER_SITE_OTHER): Payer: 59 | Admitting: Family Medicine

## 2012-05-19 VITALS — BP 140/80 | Temp 97.9°F | Wt 165.0 lb

## 2012-05-19 DIAGNOSIS — E785 Hyperlipidemia, unspecified: Secondary | ICD-10-CM

## 2012-05-19 DIAGNOSIS — I10 Essential (primary) hypertension: Secondary | ICD-10-CM

## 2012-05-19 DIAGNOSIS — E109 Type 1 diabetes mellitus without complications: Secondary | ICD-10-CM

## 2012-05-19 LAB — BASIC METABOLIC PANEL
BUN: 21 mg/dL (ref 6–23)
Chloride: 106 mEq/L (ref 96–112)
Creatinine, Ser: 1.1 mg/dL (ref 0.4–1.5)
GFR: 89.89 mL/min (ref >60.00)
Potassium: 5.2 mEq/L — ABNORMAL HIGH (ref 3.5–5.1)

## 2012-05-19 LAB — MICROALBUMIN / CREATININE URINE RATIO
Creatinine,U: 234.2 mg/dL
Microalb Creat Ratio: 1 mg/g (ref 0.0–30.0)
Microalb, Ur: 2.4 mg/dL — ABNORMAL HIGH (ref 0.0–1.9)

## 2012-05-19 LAB — LIPID PANEL: Cholesterol: 166 mg/dL (ref 0–200)

## 2012-05-19 LAB — POCT URINALYSIS DIPSTICK
Bilirubin, UA: NEGATIVE
Ketones, UA: NEGATIVE
Leukocytes, UA: NEGATIVE
Spec Grav, UA: >=1.03
pH, UA: 5.5

## 2012-05-19 LAB — PSA: PSA: 1 ng/mL (ref 0.10–4.00)

## 2012-05-19 MED ORDER — LISINOPRIL 40 MG PO TABS: ORAL_TABLET | ORAL | Status: DC

## 2012-05-19 MED ORDER — SIMVASTATIN 20 MG PO TABS: 20.0000 mg | ORAL_TABLET | Freq: Every day | ORAL | Status: DC

## 2012-05-19 MED ORDER — INSULIN GLARGINE 100 UNIT/ML ~~LOC~~ SOLN: SUBCUTANEOUS | Status: DC

## 2012-05-19 NOTE — Progress Notes (Signed)
  Subjective:    Patient ID: Tyler Scott, male    DOB: February 06, 1956, 57 y.o.   MRN: [id number]  HPI Tyler Scott is a 57 year old male who comes in today after a long absence for evaluation of tingling in his right hand and diabetes type 1  He's been seen every 3 months in the cancer center he has CLL. He is treating his diabetes with Lantus 10 units each bedtime he states his blood sugar it runs in the 110 range.  He takes Zocor 20 mg for hyperlipidemia  He takes lisinopril 40 mg daily for hypertension BP 140/80  Advised not to take Motrin He's been having tingling in his right hand for couple months. He states it comes and goes. No history of trauma. He does work on his computer most of the day. No elbow shoulder or neck pain or trauma  Review of Systems Review of systems otherwise negative    Objective:   Physical Exam  Well-developed well-nourished male in no acute distress examination right arm is normal pulses are normal neurologic exam normal Tinel sign negative      Assessment & Plan:  Carpal tunnel syndrome plan splint each bedtime when necessary  Diabetes type 1 check labs  Hypertension continue lisinopril 40 mg daily  Hyperlipidemia continue Zocor baby aspirin daily

## 2012-05-19 NOTE — Patient Instructions (Addendum)
Continue your current medications  If the tingling persists or gets worse I would recommend you get a short arm splint  I will call you I gets her lab work back so for we need to make any adjustments in your medication we can do that by found  Followup in 3-6 months depending on what your A1c is

## 2012-05-20 ENCOUNTER — Other Ambulatory Visit (INDEPENDENT_AMBULATORY_CARE_PROVIDER_SITE_OTHER): Payer: 59 | Admitting: Ophthalmology

## 2012-05-20 DIAGNOSIS — E11359 Type 2 diabetes mellitus with proliferative diabetic retinopathy without macular edema: Secondary | ICD-10-CM

## 2012-05-20 DIAGNOSIS — E1165 Type 2 diabetes mellitus with hyperglycemia: Secondary | ICD-10-CM

## 2012-05-20 DIAGNOSIS — E1139 Type 2 diabetes mellitus with other diabetic ophthalmic complication: Secondary | ICD-10-CM

## 2012-05-24 ENCOUNTER — Other Ambulatory Visit: Payer: Self-pay | Admitting: Family Medicine

## 2012-07-15 ENCOUNTER — Encounter (INDEPENDENT_AMBULATORY_CARE_PROVIDER_SITE_OTHER): Payer: 59 | Admitting: Ophthalmology

## 2012-07-15 DIAGNOSIS — E11359 Type 2 diabetes mellitus with proliferative diabetic retinopathy without macular edema: Secondary | ICD-10-CM

## 2012-07-15 DIAGNOSIS — E1139 Type 2 diabetes mellitus with other diabetic ophthalmic complication: Secondary | ICD-10-CM

## 2012-09-23 ENCOUNTER — Ambulatory Visit (INDEPENDENT_AMBULATORY_CARE_PROVIDER_SITE_OTHER): Payer: 59 | Admitting: Ophthalmology

## 2012-09-23 DIAGNOSIS — I10 Essential (primary) hypertension: Secondary | ICD-10-CM

## 2012-09-23 DIAGNOSIS — E1165 Type 2 diabetes mellitus with hyperglycemia: Secondary | ICD-10-CM

## 2012-09-23 DIAGNOSIS — H2510 Age-related nuclear cataract, unspecified eye: Secondary | ICD-10-CM

## 2012-09-23 DIAGNOSIS — H43819 Vitreous degeneration, unspecified eye: Secondary | ICD-10-CM

## 2012-09-23 DIAGNOSIS — E1139 Type 2 diabetes mellitus with other diabetic ophthalmic complication: Secondary | ICD-10-CM

## 2012-09-23 DIAGNOSIS — H35039 Hypertensive retinopathy, unspecified eye: Secondary | ICD-10-CM

## 2012-09-23 DIAGNOSIS — E11359 Type 2 diabetes mellitus with proliferative diabetic retinopathy without macular edema: Secondary | ICD-10-CM

## 2012-09-26 ENCOUNTER — Telehealth: Payer: Self-pay | Admitting: Hematology and Oncology

## 2012-09-26 ENCOUNTER — Encounter: Payer: Self-pay | Admitting: Oncology

## 2012-09-26 ENCOUNTER — Other Ambulatory Visit (HOSPITAL_BASED_OUTPATIENT_CLINIC_OR_DEPARTMENT_OTHER): Payer: 59 | Admitting: Lab

## 2012-09-26 ENCOUNTER — Ambulatory Visit (HOSPITAL_BASED_OUTPATIENT_CLINIC_OR_DEPARTMENT_OTHER): Payer: 59 | Admitting: Oncology

## 2012-09-26 VITALS — BP 142/77 | HR 86 | Temp 97.5°F | Resp 18 | Ht 68.0 in | Wt 162.5 lb

## 2012-09-26 DIAGNOSIS — E119 Type 2 diabetes mellitus without complications: Secondary | ICD-10-CM

## 2012-09-26 DIAGNOSIS — C9110 Chronic lymphocytic leukemia of B-cell type not having achieved remission: Secondary | ICD-10-CM

## 2012-09-26 DIAGNOSIS — I10 Essential (primary) hypertension: Secondary | ICD-10-CM

## 2012-09-26 DIAGNOSIS — C8599 Non-Hodgkin lymphoma, unspecified, extranodal and solid organ sites: Secondary | ICD-10-CM

## 2012-09-26 LAB — CBC WITH DIFFERENTIAL/PLATELET
BASO%: 0.3 % (ref 0.0–2.0)
Eosinophils Absolute: 0.2 10*3/uL (ref 0.0–0.5)
MCHC: 33.5 g/dL (ref 32.0–36.0)
MONO#: 0.6 10*3/uL (ref 0.1–0.9)
NEUT#: 4.7 10*3/uL (ref 1.5–6.5)
RBC: 4.34 10*6/uL (ref 4.20–5.82)
WBC: 11.5 10*3/uL — ABNORMAL HIGH (ref 4.0–10.3)
lymph#: 6 10*3/uL — ABNORMAL HIGH (ref 0.9–3.3)

## 2012-09-26 LAB — COMPREHENSIVE METABOLIC PANEL (CC13)
ALT: 15 U/L (ref 0–55)
AST: 18 U/L (ref 5–34)
Alkaline Phosphatase: 72 U/L (ref 40–150)
Potassium: 4.3 mEq/L (ref 3.5–5.1)
Sodium: 137 mEq/L (ref 136–145)
Total Bilirubin: 0.98 mg/dL (ref 0.20–1.20)
Total Protein: 7.6 g/dL (ref 6.4–8.3)

## 2012-09-26 LAB — LACTATE DEHYDROGENASE (CC13): LDH: 144 U/L (ref 125–245)

## 2012-09-26 NOTE — Progress Notes (Signed)
[facility]  Telephone:(336) [phone] Fax:(336) [phone]   OFFICE PROGRESS NOTE   Cc:  TODD,JEFFREY ALLEN, MD  DIAGNOSIS: Small lymphocytic lymphoma; stage III.   CURRENT THERAPY: Watchful observation.  INTERVAL HISTORY: Tyler Scott 57 y.o. male returns for regular follow up by himself.  He can still feel some cervical adenopathy right more than left.  It has not grown much since last visit.  He denied palpable adenopathy elsewhere.  He still works full time.    He denies fever, anorexia, weight loss, fatigue, headache, visual changes, confusion, drenching night sweats, mucositis, odynophagia, dysphagia, nausea vomiting, jaundice, chest pain, palpitation, shortness of breath, dyspnea on exertion, productive cough, gum bleeding, epistaxis, hematemesis, hemoptysis, abdominal pain, abdominal swelling, early satiety, melena, hematochezia, hematuria, skin rash, spontaneous bleeding, joint swelling, joint pain, heat or cold intolerance, bowel bladder incontinence, back pain, focal motor weakness, paresthesia, depression.     Past Medical History  Diagnosis Date  . DIABETES MELLITUS, TYPE I, UNCONTROLLED 12/30/2006  . HYPERLIPIDEMIA 12/30/2006  . HYPERTENSION 12/30/2006  . ERECTILE DYSFUNCTION, ORGANIC 12/30/2006  . LOW BACK PAIN, ACUTE 12/21/2006  . Diabetic retinopathy   . CLL (chronic lymphocytic leukemia)     stage III: asymptomatic; on observation    History reviewed. No pertinent past surgical history.  Current Outpatient Prescriptions  Medication Sig Dispense Refill  . aspirin 81 MG tablet Take 81 mg by mouth daily.      . B-D UF III MINI PEN NEEDLES 31G X 5 MM MISC USE WITH LANTUS SOLOSTAR   DAILY  90 each  3  . glucose blood (ACCU-CHEK COMPACT STRIPS) test strip Use as instructed  100 each  3  . ibuprofen (ADVIL,MOTRIN) 800 MG tablet Take 800 mg by mouth every 6 (six) hours as needed.        . insulin glargine (LANTUS SOLOSTAR) 100 UNIT/ML injection  INJECT 15 UNITS INTO THE   SKIN AT BEDTIME  15 mL  3  . Insulin Pen Needle (PEN NEEDLES) 31G X 6 MM MISC 1 Syringe by Does not apply route 1 dose over 46 hours. Once daily for glucose control  100 each  3  . ketorolac (ACULAR) 0.4 % SOLN       . LEVITRA 20 MG tablet TAKE 1 TABLET BY MOUTH EVERY DAY AS NEEDED *TIME FOR AN OFFICE VISIT*  3 tablet  0  . LEVITRA 20 MG tablet TAKE 1 TABLET BY MOUTH EVERY DAY AS NEEDED *TIME FOR AN OFFICE VISIT*  6 tablet  11  . lisinopril (PRINIVIL,ZESTRIL) 40 MG tablet TAKE 1 TABLET DAILY  90 tablet  3  . simvastatin (ZOCOR) 20 MG tablet Take 1 tablet (20 mg total) by mouth at bedtime.  100 tablet  3   No current facility-administered medications for this visit.    ALLERGIES:  has No Known Allergies.  REVIEW OF SYSTEMS:  The rest of the 14-point review of system was negative.   Filed Vitals:   09/26/12 1048  BP: 142/77  Pulse: 86  Temp: 97.5 F (36.4 C)  Resp: 18   Wt Readings from Last 3 Encounters:  09/26/12 162 lb 8 oz (73.71 kg)  05/19/12 165 lb (74.844 kg)  04/25/12 164 lb 6.4 oz (74.571 kg)   ECOG Performance status: 0  PHYSICAL EXAMINATION:   General:  well-nourished man, in no acute distress.  Eyes:  no scleral icterus.  ENT:  There were no oropharyngeal lesions.  Neck was without thyromegaly.  Lymphatics:  Shotty cervical, supraclavicular or axillary adenopathy.  The largest one was about 2cm in the left cervical area.   Respiratory: lungs were clear bilaterally without wheezing or crackles.  Cardiovascular:  Regular rate and rhythm, S1/S2, without murmur, rub or gallop.  There was no pedal edema.  GI:  abdomen was soft, flat, nontender, nondistended, without hepatomegaly but positive for splenomegaly.  His rectus muscle was strong making it difficult for me to feel the exact edge of the spleen; however, it was approximately 5cm below the costal margin .  Muscoloskeletal:  no spinal tenderness of palpation of vertebral spine.  Skin exam was  without echymosis, petichae.  Neuro exam was nonfocal.  Patient was able to get on and off exam table without assistance.  Gait was normal.  Patient was alert and oriented.  Attention was good.   Language was appropriate.  Mood was normal without depression.  Speech was not pressured.  Thought content was not tangential.     LABORATORY/RADIOLOGY DATA:  Lab Results  Component Value Date   WBC 11.5* 09/26/2012   HGB 13.0 09/26/2012   HCT 38.9 09/26/2012   PLT 315 09/26/2012   GLUCOSE 239* 09/26/2012   CHOL 166 05/19/2012   TRIG 78.0 05/19/2012   HDL 45.20 05/19/2012   LDLCALC 105* 05/19/2012   ALKPHOS 72 09/26/2012   ALT 15 09/26/2012   AST 18 09/26/2012   NA 137 09/26/2012   K 4.3 09/26/2012   CL 106 05/19/2012   CREATININE 1.1 09/26/2012   BUN 16.6 09/26/2012   CO2 25 09/26/2012   PSA 1.00 05/19/2012   INR 0.99 08/27/2010   HGBA1C 8.2* 05/19/2012   MICROALBUR 2.4* 05/19/2012    ASSESSMENT AND PLAN:   1. Stage III small lymphocytic lymphoma (SLL given diffuse adenopathy and splenomegaly).   -Status: Still no indication for treatment.  Indications for treatment include:  Severe constitutional symptoms, severe anemia/low platelet count, recurrent infection, or very bulky nodes.  -I again recommended watchful observation. He expressed informed understanding and wished to proceed with stated plan.   2. Diabetes Mellitus, type II: he's on insulin with Dr. Tawanna Cooler.   3. Hyperlipidemia: he is on simvastatin per PCP.   4.  HTN:  On Lisinopril.   5. Follow up:  Lab and return visit with the Cancer Center in about 6 months. I advised him to contact us with concerning symptoms in the interval.     The length of time of the face-to-face encounter was 15 minutes. More than 50% of time was spent counseling and coordination of care.

## 2012-09-26 NOTE — Telephone Encounter (Signed)
Gave pt appt for lab and MD on February 2015 °

## 2012-09-26 NOTE — Patient Instructions (Signed)
Results for Tyler Scott, Tyler Scott (MRN [id number]) as of 09/26/2012 11:05  Ref. Range 09/26/2012 10:33  WBC Latest Range: 4.0-10.5 K/uL 11.5 (H)  RBC Latest Range: 4.22-5.81 MIL/uL 4.34  Hemoglobin Latest Range: 13.0-17.0 g/dL 41.3  HCT Latest Range: 39.0-52.0 % 38.9  MCV Latest Range: 78.0-100.0 fL 89.6  MCH Latest Range: 26.0-34.0 pg 30.1  MCHC Latest Range: 30.0-36.0 g/dL 24.4  RDW Latest Range: 11.5-15.5 % 14.0  Platelets Latest Range: 150-400 K/uL 315  NEUT% Latest Range: 39.0-75.0 % 40.5  LYMPH% Latest Range: 14.0-49.0 % 52.3 (H)  MONO% Latest Range: 0.0-14.0 % 5.2  EOS% Latest Range: 0.0-7.0 % 1.7  BASO% Latest Range: 0.0-2.0 % 0.3  NEUT# Latest Range: 1.4-7.7 K/uL 4.7  MONO# Latest Range: 0.1-0.9 10e3/uL 0.6  Eosinophils Absolute Latest Range: 0.0-0.5 10e3/uL 0.2  Basophils Absolute Latest Range: 0.0-0.1 K/uL 0.0  lymph# Latest Range: 0.9-3.3 10e3/uL 6.0 (H)

## 2012-10-21 ENCOUNTER — Other Ambulatory Visit: Payer: Self-pay | Admitting: Family Medicine

## 2012-11-22 ENCOUNTER — Other Ambulatory Visit: Payer: Self-pay | Admitting: Family Medicine

## 2013-01-16 ENCOUNTER — Other Ambulatory Visit: Payer: Self-pay

## 2013-01-16 ENCOUNTER — Telehealth: Payer: Self-pay

## 2013-01-16 ENCOUNTER — Telehealth: Payer: Self-pay | Admitting: Internal Medicine

## 2013-01-16 NOTE — Telephone Encounter (Signed)
Pt called and r/s lab and Md to [place] from february 2015,nurse notified

## 2013-01-16 NOTE — Telephone Encounter (Signed)
S/w pt he is feeling increased swelling on back of R neck and L of jaw as the primary spots. He denies any respiratory problems, no increase in his night sweats, no fevers. He has rescheduled from Feb to 02/16/12. Will confer with Dr Rosie Fate if this needs to be sooner and will call pt back.

## 2013-01-17 ENCOUNTER — Telehealth: Payer: Self-pay | Admitting: Internal Medicine

## 2013-01-17 ENCOUNTER — Other Ambulatory Visit: Payer: Self-pay | Admitting: Family Medicine

## 2013-01-17 ENCOUNTER — Other Ambulatory Visit (INDEPENDENT_AMBULATORY_CARE_PROVIDER_SITE_OTHER): Payer: 59

## 2013-01-17 DIAGNOSIS — E109 Type 1 diabetes mellitus without complications: Secondary | ICD-10-CM

## 2013-01-17 LAB — MICROALBUMIN / CREATININE URINE RATIO
Creatinine,U: 54.7 mg/dL
Microalb Creat Ratio: 1.8 mg/g (ref 0.0–30.0)

## 2013-01-17 LAB — BASIC METABOLIC PANEL
BUN: 17 mg/dL (ref 6–23)
CO2: 27 mEq/L (ref 19–32)
Calcium: 9.6 mg/dL (ref 8.4–10.5)
Chloride: 99 mEq/L (ref 96–112)
Glucose, Bld: 167 mg/dL — ABNORMAL HIGH (ref 70–99)
Potassium: 5.5 mEq/L — ABNORMAL HIGH (ref 3.5–5.1)
Sodium: 134 mEq/L — ABNORMAL LOW (ref 135–145)

## 2013-01-17 NOTE — Telephone Encounter (Signed)
per Nurse Olegario Messier moved 02/15/13 appts for lab and OV to 12/11 and called pt and adv of same shh

## 2013-01-18 ENCOUNTER — Other Ambulatory Visit: Payer: Self-pay | Admitting: Internal Medicine

## 2013-01-18 DIAGNOSIS — C9110 Chronic lymphocytic leukemia of B-cell type not having achieved remission: Secondary | ICD-10-CM

## 2013-01-19 ENCOUNTER — Ambulatory Visit (HOSPITAL_BASED_OUTPATIENT_CLINIC_OR_DEPARTMENT_OTHER): Payer: 59 | Admitting: Internal Medicine

## 2013-01-19 ENCOUNTER — Other Ambulatory Visit (HOSPITAL_BASED_OUTPATIENT_CLINIC_OR_DEPARTMENT_OTHER): Payer: 59

## 2013-01-19 VITALS — BP 139/73 | HR 84 | Temp 97.0°F | Resp 18 | Ht 68.0 in | Wt 163.0 lb

## 2013-01-19 DIAGNOSIS — E119 Type 2 diabetes mellitus without complications: Secondary | ICD-10-CM

## 2013-01-19 DIAGNOSIS — C9110 Chronic lymphocytic leukemia of B-cell type not having achieved remission: Secondary | ICD-10-CM

## 2013-01-19 DIAGNOSIS — R161 Splenomegaly, not elsewhere classified: Secondary | ICD-10-CM

## 2013-01-19 DIAGNOSIS — E875 Hyperkalemia: Secondary | ICD-10-CM

## 2013-01-19 DIAGNOSIS — C8599 Non-Hodgkin lymphoma, unspecified, extranodal and solid organ sites: Secondary | ICD-10-CM

## 2013-01-19 LAB — CBC WITH DIFFERENTIAL/PLATELET
BASO%: 0.5 % (ref 0.0–2.0)
Basophils Absolute: 0.1 10*3/uL (ref 0.0–0.1)
HCT: 39.4 % (ref 38.4–49.9)
HGB: 13 g/dL (ref 13.0–17.1)
MCH: 30.3 pg (ref 27.2–33.4)
MONO#: 0.7 10*3/uL (ref 0.1–0.9)
NEUT%: 38.5 % — ABNORMAL LOW (ref 39.0–75.0)
Platelets: 283 10*3/uL (ref 140–400)
RDW: 13.3 % (ref 11.0–14.6)
WBC: 14.6 10*3/uL — ABNORMAL HIGH (ref 4.0–10.3)
lymph#: 8 10*3/uL — ABNORMAL HIGH (ref 0.9–3.3)

## 2013-01-19 NOTE — Patient Instructions (Signed)
Chronic Lymphocytic Leukemia Chronic lymphocytic leukemia (CLL) is a type of cancer of the bone marrow and blood cells. Bone marrow is the soft, spongy tissue inside your bone. In CLL, the bone marrow makes too many white blood cells that usually fight infection in the body (lymphocytes). CLL usually gets worse slowly and is the most common type of adult leukemia.  RISK FACTORS No one knows the exact cause of CLL. There is a higher risk of CLL in people who:   Are older than 50 years.  Are white.  Are male.  Have a family history of CLL or other cancers of the lymph system.  Are of Russian Jewish or Eastern European Jewish descent.  Have been exposed to certain chemicals, such as Agent Orange (used in the [place] War) or other herbicides or insecticides. SYMPTOMS  At first, there may be no symptoms of chronic lymphocytic leukemia. After a while, some symptoms may occur, such as:   Feeling more tired than usual, even after rest.  Unplanned weight loss.  Heavy sweating at night.  Fevers.  Shortness of breath.  Decreased energy.  Paleness.  Painless, swollen lymph nodes.  A feeling of fullness in the upper left part of the abdomen.  Easy bruising or bleeding.  More frequent infections. DIAGNOSIS  Your health care provider may perform the following exams and tests to diagnose CLL:  Physical exam to check for an enlarged spleen, liver, or lymph nodes.  Blood and bone marrow tests to identify the presence of cancer cells. These may include tests such as complete blood count, flow cytometry, immunophenotyping, and fluorescence in situ hybridization (FISH).  CT scan to look for swelling or abnormalities in your spleen, liver, and lymph nodes. TREATMENT  Treatment options for CLL depend on the stage and the presence of symptoms. There are a number of types of treatment used for this condition, including:  Observation.  Targeted drugs. These are drugs that interfere with  chemicals that leukemia cells need in order to grow and multiply. They identify and attack specific cancer cells without harming normal cells.  Chemotherapy drugs. These medicines kill cells that are multiplying quickly, such as leukemia cells.  Radiation.  Surgery to remove the spleen.  Biological therapy. This treatment boosts the ability of your own immune system to fight the leukemia cells.  Bone marrow or peripheral blood stem cell transplant. This treatment allows the patient to receive very high doses of chemotherapy and/or radiation. These high doses kill the cancer cells but also destroy the bone marrow. After treatment is complete, you are given donor bone marrow or stem cells, which will replace the bone marrow. HOME CARE INSTRUCTIONS   Because you have an increased risk of infection, practice good hand washing and avoid being around people who are ill or being in crowded places.  Because you have an increased risk of bleeding and bruising, avoid contact sports or other rough activities.  Only take over-the-counter or prescription medicines for pain, discomfort, or fever as directed by your health care provider.  Although some of your treatments might affect your appetite, try to eat regular, healthy meals.  If you develop any side effects, such as nausea, diarrhea, rash, white patches in your mouth, a sore throat, difficulty swallowing, or severe fatigue, tell your health care provider. He or she may have recommendations of things you can do to improve symptoms.  Consider learning some ways to cope with the stress of having a chronic illness, such as yoga, meditation,   or participating in a support group. SEEK MEDICAL CARE IF:  You develop chest pains.  You notice pain, swelling or redness anywhere in your legs.  You have pain in your belly (abdomen).  You develop new bruises that are getting bigger.  You have painful or more swollen lymph nodes.  You develop bleeding  from your gums, nose, or in your urine or stools.  You are unable to stop throwing up (vomiting).  You cannot keep liquids down.  You feel lightheaded.  You have a fever or persistent symptoms for more than 2 3 days.  You develop a severe stiff neck or headache. SEEK IMMEDIATE MEDICAL CARE IF:  You have trouble breathing or feel short of breath.  You faint. Document Released: 06/14/2008 Document Revised: 09/28/2012 Document Reviewed: 07/21/2012 [facility] Patient Information 2014 [place], [place].   Enlarged Spleen The spleen is an organ located in the upper abdomen under your left ribs. It is a spongelike organ, about the size of an orange, which acts as a filter. The spleen is part of the lymph system and filters the blood. It removes old blood cells and abnormal blood cells. It is also part of the immune response and helps fight infections. An enlarged spleen (splenomegaly) is usually noticed when it is almost twice its normal size. CAUSES  There are many possible causes of an enlarged spleen. These causes include:  Infections (viral, bacterial, or parasitic).  Liver cirrhosis and other liver diseases.  Hemolytic anemia (types of anemia that lower your red blood cell count) and other blood diseases.  Hypersplenism (reduction in many types of blood cells by an enlarged spleen).  Blood cancers (leukemia, Hodgkin's disease).  Metabolic disorders (Gaucher's disease, Niemann-Pick disease).  Tumors and cysts.  Pressure or blood clots in the veins of the spleen.  Connective tissue disorders (lupus, rheumatoid arthritis with Felty's syndrome). SYMPTOMS  An enlarged spleen may not always cause symptoms. If symptoms do occur, they may include:  Pain in the upper left abdomen (pain may spread to the left shoulder or get worse when you take a breath).  Feeling full without eating or eating only a small amount.  Feeling tired.  Chronic infections.  Bleeding easily. DIAGNOSIS    Tests may include:  Physical examination of the left upper abdomen.  Blood tests to check red and white blood cells and other proteins and enzymes.  Imaging tests, such as abdominal ultrasonography, computerized X-ray scan (computed tomography, CT), and computerized magnetic scan (magnetic resonance imaging, MRI).  Taking a tissue sample (biopsy) of the liver to examine it.  Examining a bone marrow biopsy sample. TREATMENT  Treatment varies depending on the cause of the enlarged spleen. Treatment aims to manage the conditions that cause swelling of the spleen and reduce the size of the spleen. Treatment may include:  Medications to eliminate infection or treat disease.  Radiation therapy.  Blood transfusions.  Vaccinations. If these treatments are not successful, or the cause cannot be determined, surgery to remove the spleen (splenectomy) may be recommended. HOME CARE INSTRUCTIONS   Take all medications as directed.  Take all antibiotics, even if you start to feel better. Discuss with your caregiver the use of a probiotic supplement to prevent stomach upset.  To avoid injury or a ruptured spleen:  Limit activities as directed.  Avoid contact sports.  Wear your seatbelt in the car.  See your caregiver for vaccinations, follow up examinations and testing as directed.  Follow all of your caregiver's instructions on managing  the conditions that cause your enlarged spleen. PREVENTION  It is not always possible to prevent an enlarged spleen. Reduce your chances of developing an enlarged spleen:  Practice good hygiene to prevent infection.  Get recommended vaccines to prevent infection. SEEK MEDICAL CARE IF:   You develop a fever (more than 100.33F [38.1 C]) or other signs of infection (chills, feeling unwell).  You experience injury or impact to the spleen area.  Your symptoms do not go away as you and your doctor expected.  You experience increased pain when you  take in a breath.  Your symptoms worsen, or you develop new symptoms. MAKE SURE YOU:   Understand these instructions.  Will watch your condition.  Will get help right away if you are not doing well or get worse. Follow up with your caregiver to find out the results of your tests. Not all test results may be available during your visit. If your test results are not back during the visit, make an appointment with your caregiver to find out the results. Do not assume everything is normal if you have not heard from your caregiver or the medical facility. It is important for you to follow up on all of your test results.  Document Released: 07/16/2009 Document Revised: 04/20/2011 Document Reviewed: 07/16/2009 [facility] Patient Information 2014 [place], [place].   Lisinopril tablets What is this medicine? LISINOPRIL (lyse IN oh pril) is an ACE inhibitor. This medicine is used to treat high blood pressure and heart failure. It is also used to protect the heart immediately after a heart attack. This medicine may be used for other purposes; ask your health care provider or pharmacist if you have questions. COMMON BRAND NAME(S): Prinivil, Zestril What should I tell my health care provider before I take this medicine? They need to know if you have any of these conditions: -diabetes -heart or blood vessel disease -immune system disease like lupus or scleroderma -kidney disease -low blood pressure -previous swelling of the tongue, face, or lips with difficulty breathing, difficulty swallowing, hoarseness, or tightening of the throat -an unusual or allergic reaction to lisinopril, other ACE inhibitors, insect venom, foods, dyes, or preservatives -pregnant or trying to get pregnant -breast-feeding How should I use this medicine? Take this medicine by mouth with a glass of water. Follow the directions on your prescription label. You may take this medicine with or without food. Take your medicine at  regular intervals. Do not stop taking this medicine except on the advice of your doctor or health care professional. Talk to your pediatrician regarding the use of this medicine in children. Special care may be needed. While this drug may be prescribed for children as young as 27 years of age for selected conditions, precautions do apply. Overdosage: If you think you have taken too much of this medicine contact a poison control center or emergency room at once. NOTE: This medicine is only for you. Do not share this medicine with others. What if I miss a dose? If you miss a dose, take it as soon as you can. If it is almost time for your next dose, take only that dose. Do not take double or extra doses. What may interact with this medicine? -diuretics -lithium -NSAIDs, medicines for pain and inflammation, like ibuprofen or naproxen -over-the-counter herbal supplements like hawthorn -potassium salts or potassium supplements -salt substitutes This list may not describe all possible interactions. Give your health care provider a list of all the medicines, herbs, non-prescription drugs, or dietary  supplements you use. Also tell them if you smoke, drink alcohol, or use illegal drugs. Some items may interact with your medicine. What should I watch for while using this medicine? Visit your doctor or health care professional for regular check ups. Check your blood pressure as directed. Ask your doctor what your blood pressure should be, and when you should contact him or her. Call your doctor or health care professional if you notice an irregular or fast heart beat. Women should inform their doctor if they wish to become pregnant or think they might be pregnant. There is a potential for serious side effects to an unborn child. Talk to your health care professional or pharmacist for more information. Check with your doctor or health care professional if you get an attack of severe diarrhea, nausea and vomiting,  or if you sweat a lot. The loss of too much body fluid can make it dangerous for you to take this medicine. You may get drowsy or dizzy. Do not drive, use machinery, or do anything that needs mental alertness until you know how this drug affects you. Do not stand or sit up quickly, especially if you are an older patient. This reduces the risk of dizzy or fainting spells. Alcohol can make you more drowsy and dizzy. Avoid alcoholic drinks. Avoid salt substitutes unless you are told otherwise by your doctor or health care professional. Do not treat yourself for coughs, colds, or pain while you are taking this medicine without asking your doctor or health care professional for advice. Some ingredients may increase your blood pressure. What side effects may I notice from receiving this medicine? Side effects that you should report to your doctor or health care professional as soon as possible: -abdominal pain with or without nausea or vomiting -allergic reactions like skin rash or hives, swelling of the hands, feet, face, lips, throat, or tongue -dark urine -difficulty breathing -dizzy, lightheaded or fainting spell -fever or sore throat -irregular heart beat, chest pain -pain or difficulty passing urine -redness, blistering, peeling or loosening of the skin, including inside the mouth -unusually weak -yellowing of the eyes or skin Side effects that usually do not require medical attention (report to your doctor or health care professional if they continue or are bothersome): -change in taste -cough -decreased sexual function or desire -headache -sun sensitivity -tiredness This list may not describe all possible side effects. Call your doctor for medical advice about side effects. You may report side effects to FDA at 1-800-FDA-1088. Where should I keep my medicine? Keep out of the reach of children. Store at room temperature between 15 and 30 degrees C (59 and 86 degrees F). Protect from  moisture. Keep container tightly closed. Throw away any unused medicine after the expiration date. NOTE: This sheet is a summary. It may not cover all possible information. If you have questions about this medicine, talk to your doctor, pharmacist, or health care provider.  2014, [employer]. (2007-08-01 17:36:32) Hyperkalemia Hyperkalemia is when you have too much potassium in your blood. This can be a life-threatening condition. Potassium is normally removed (excreted) from the body by the kidneys. CAUSES  The potassium level in your body can become too high for the following reasons:  You take in too much potassium. You can do this by:  Using salt substitutes. They contain large amounts of potassium.  Taking potassium supplements from your caregiver. The dose may be too high for you.  Eating foods or taking nutritional products with potassium.  You excrete too little potassium. This can happen if:  Your kidneys are not functioning properly. Kidney (renal) disease is a very common cause of hyperkalemia.  You are taking medicines that lower your excretion of potassium, such as certain diuretic medicines.  You have an adrenal gland disease called Addison's disease.  You have a urinary tract obstruction, such as kidney stones.  You are on treatment to mechanically clean your blood (dialysis) and you skip a treatment.  You release a high amount of potassium from your cells into your blood. You may have a condition that causes potassium to move from your cells to your bloodstream. This can happen with:  Injury to muscles or other tissues. Most potassium is stored in the muscles.  Severe burns or infections.  Acidic blood plasma (acidosis). Acidosis can result from many diseases, such as uncontrolled diabetes. SYMPTOMS  Usually, there are no symptoms unless the potassium is dangerously high or has risen very quickly. Symptoms may include:  Irregular or very slow  heartbeat.  Feeling sick to your stomach (nauseous).  Tiredness (fatigue).  Nerve problems such as tingling of the skin, numbness of the hands or feet, weakness, or paralysis. DIAGNOSIS  A simple blood test can measure the amount of potassium in your body. An electrocardiogram test of the heart can also help make the diagnosis. The heart may beat dangerously fast or slow down and stop beating with severe hyperkalemia.  TREATMENT  Treatment depends on how bad the condition is and on the underlying cause.  If the hyperkalemia is an emergency (causing heart problems or paralysis), many different medicines can be used alone or together to lower the potassium level briefly. This may include an insulin injection even if you are not diabetic. Emergency dialysis may be needed to remove potassium from the body.  If the hyperkalemia is less severe or dangerous, the underlying cause is treated. This can include taking medicines if needed. Your prescription medicines may be changed. You may also need to take a medicine to help your body get rid of potassium. You may need to eat a diet low in potassium. HOME CARE INSTRUCTIONS   Take medicines and supplements as directed by your caregiver.  Do not take any over-the-counter medicines, supplements, natural products, herbs, or vitamins without reviewing them with your caregiver. Certain supplements and natural food products can have high amounts of potassium. Other products (such as ibuprofen) can damage weak kidneys and raise your potassium.  You may be asked to do repeat lab tests. Be sure to follow these directions.  If you have kidney disease, you may need to follow a low potassium diet. SEEK MEDICAL CARE IF:   You notice an irregular or very slow heartbeat.  You feel lightheaded.  You develop weakness that is unusual for you. SEEK IMMEDIATE MEDICAL CARE IF:   You have shortness of breath.  You have chest discomfort.  You pass out  (faint). MAKE SURE YOU:   Understand these instructions.  Will watch your condition.  Will get help right away if you are not doing well or get worse. Document Released: 01/16/2002 Document Revised: 04/20/2011 Document Reviewed: 07/03/2010 [facility] Patient Information 2014 [place], [place].

## 2013-01-20 ENCOUNTER — Telehealth: Payer: Self-pay | Admitting: Internal Medicine

## 2013-01-20 NOTE — Telephone Encounter (Signed)
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

## 2013-01-21 DIAGNOSIS — E875 Hyperkalemia: Secondary | ICD-10-CM | POA: Insufficient documentation

## 2013-01-21 DIAGNOSIS — R161 Splenomegaly, not elsewhere classified: Secondary | ICD-10-CM | POA: Insufficient documentation

## 2013-01-21 NOTE — Progress Notes (Signed)
[facility] OFFICE PROGRESS NOTE  Evette Georges, MD [address] [place] [address]  DIAGNOSIS: CLL (chronic lymphocytic leukemia) - Plan: CBC with Differential, Comprehensive metabolic panel (Cmet) - [facility], Lactate dehydrogenase (LDH) - [facility]  Hyperkalemia  Splenomegaly  Chief Complaint  Patient presents with  . CLL (chronic lymphocytic leukemia)    CURRENT THERAPY: Watchful observation.   INTERVAL HISTORY: Tyler Scott 57 y.o. male with a history of small lymphocytic lymphoma, Stage III is here for follow-up. He reports doing well overall.  He reports continued cervical adenopathy right more than left.  He states his wife was concerned about the appearance.  He denies any constitutional symptoms such as weight lost, fevers or night sweats.  He denies inguinal, axillary palpable adenopathy.  He denies pains related to his cervical adenopathy.  He denies early satiety.    MEDICAL HISTORY: Past Medical History  Diagnosis Date  . DIABETES MELLITUS, TYPE I, UNCONTROLLED 12/30/2006  . HYPERLIPIDEMIA 12/30/2006  . HYPERTENSION 12/30/2006  . ERECTILE DYSFUNCTION, ORGANIC 12/30/2006  . LOW BACK PAIN, ACUTE 12/21/2006  . Diabetic retinopathy   . CLL (chronic lymphocytic leukemia)     stage III: asymptomatic; on observation    INTERIM HISTORY: has DIABETES MELLITUS, TYPE I; HYPERLIPIDEMIA; HYPERTENSION; ERECTILE DYSFUNCTION, ORGANIC; LOW BACK PAIN, ACUTE; CLL (chronic lymphocytic leukemia); Hyperkalemia; and Splenomegaly on his problem list.    ALLERGIES:  has No Known Allergies.  MEDICATIONS: has a current medication list which includes the following prescription(s): accu-chek compact plus, aspirin, b-d uf iii mini pen needles, glucose blood, ibuprofen, insulin glargine, pen needles, ketorolac, levitra, lisinopril, and simvastatin.  SURGICAL HISTORY: No past surgical history on file.  REVIEW OF SYSTEMS:   Constitutional: Denies fevers, chills  or abnormal weight loss Eyes: Denies blurriness of vision Ears, nose, mouth, throat, and face: Denies mucositis or sore throat Respiratory: Denies cough, dyspnea or wheezes Cardiovascular: Denies palpitation, chest discomfort or lower extremity swelling Gastrointestinal:  Denies nausea, heartburn or change in bowel habits Skin: Denies abnormal skin rashes Lymphatics: Denies new lymphadenopathy or easy bruising Neurological:Denies numbness, tingling or new weaknesses Behavioral/Psych: Mood is stable, no new changes  All other systems were reviewed with the patient and are negative.  PHYSICAL EXAMINATION: ECOG PERFORMANCE STATUS: 1 - Symptomatic but completely ambulatory  Blood pressure 139/73, pulse 84, temperature 97 F (36.1 C), temperature source Oral, resp. rate 18, height 5\' 8"  (1.727 m), weight 163 lb (73.936 kg), SpO2 98.00%.  GENERAL:alert, no distress and comfortable; mildy anxious.  SKIN: skin color, texture, turgor are normal, no rashes or significant lesions EYES: normal, Conjunctiva are pink and non-injected, sclera clear OROPHARYNX:no exudate, no erythema and lips, buccal mucosa, and tongue normal  NECK: supple, thyroid normal size, non-tender, without nodularity LYMPH: palpable lymphadenopathy in the cervical that is shotty with the largest one about 2 cm in the left cervical area. No palpable axillary or supraclavicular lymph nodes LUNGS: clear to auscultation and percussion with normal breathing effort HEART: regular rate & rhythm and no murmurs and no lower extremity edema ABDOMEN:abdomen soft, non-tender and normal bowel sounds ; palpable spleen abut 5 cm below the costal margin.  Musculoskeletal:no cyanosis of digits and no clubbing  NEURO: alert & oriented x 3 with fluent speech, no focal motor/sensory deficits  Labs:  Lab Results  Component Value Date   WBC 14.6* 01/19/2013   HGB 13.0 01/19/2013   HCT 39.4 01/19/2013   MCV 91.8 01/19/2013   PLT 283 01/19/2013  NEUTROABS 5.6 01/19/2013      Chemistry      Component Value Date/Time   NA 134* 01/17/2013 1227   NA 137 09/26/2012 1033   K 5.5* 01/17/2013 1227   K 4.3 09/26/2012 1033   CL 99 01/17/2013 1227   CL 102 04/25/2012 1003   CO2 27 01/17/2013 1227   CO2 25 09/26/2012 1033   BUN 17 01/17/2013 1227   BUN 16.6 09/26/2012 1033   CREATININE 1.0 01/17/2013 1227   CREATININE 1.1 09/26/2012 1033      Component Value Date/Time   CALCIUM 9.6 01/17/2013 1227   CALCIUM 9.3 09/26/2012 1033   ALKPHOS 72 09/26/2012 1033   ALKPHOS 58 06/26/2011 1317   AST 18 09/26/2012 1033   AST 20 06/26/2011 1317   ALT 15 09/26/2012 1033   ALT 16 06/26/2011 1317   BILITOT 0.98 09/26/2012 1033   BILITOT 0.7 06/26/2011 1317     Basic Metabolic Panel:  Recent Labs Lab 01/17/13 1227  NA 134*  K 5.5*  CL 99  CO2 27  GLUCOSE 167*  BUN 17  CREATININE 1.0  CALCIUM 9.6   GFR Estimated Creatinine Clearance: 78.9 ml/min (by C-G formula based on Cr of 1).  CBC:  Recent Labs Lab 01/19/13 1345  WBC 14.6*  NEUTROABS 5.6  HGB 13.0  HCT 39.4  MCV 91.8  PLT 283   Microbiology Recent Results (from the past 240 hour(s))  TECHNOLOGIST REVIEW     Status: None   Collection Time    01/19/13  1:45 PM      Result Value Range Status   Technologist Review Variant lymphs present   Final   Surgical pathology: 08/27/2010 Bone Marrow, Aspirate,Biopsy, and Clot, left [facility] - TRILINEAGE HEMATOPOIESIS - TWO SMALL LYMPHOID AGGREGATES PRESENT - SEE COMMENT PERIPHERAL BLOOD: - ATYPICAL CIRCULATING LYMPHOCYTES. RADIOGRAPHIC STUDIES: No results found.  ASSESSMENT: Tyler Scott 57 y.o. male with a history of CLL (chronic lymphocytic leukemia) - Plan: CBC with Differential, Comprehensive metabolic panel (Cmet) - [facility], Lactate dehydrogenase (LDH) - [facility]  Hyperkalemia  Splenomegaly  PLAN:   1. Small lymphocytic lymphoma, stage III.  -- His white blood cell count is 14.6 today.   He denies any evidence of constitutional  symptoms or painful adenopathy.  Given his concerns about his appearance, we will follow him in 2 months.  We discussed the indications for treatment would include severe constitutional symptoms such as anemia/thrombocytopenia, recurrent infections or very bulky lymph nodes.  He agreed with this plan and will follow up closely.  --Given his age less than 20 and absence of organ dysfunction, will consider fludarbine and cyclophosphamide and rituximab (FCR). We will check for hepatitis B prior to starting.   We will treat if he continues to have increase in his lymph nodes and/or severe symptoms.    2. Hyperkalemia.  -- His potassium level is 5.5.   Patient is on lisinopril.  He was provided a handout on hyperkalemia.  He instructed that he should follow up with his primary care office next week for repeat chemistries and if it remains elevated, he might need dose decreased.   3. DM2 on insulin. --Management per his PCP.  4. Hypercholesteremia. --Continue simvastatin.  Management per his PCP.   All questions were answered. The patient knows to call the clinic with any problems, questions or concerns. We can certainly see the patient much sooner if necessary.  I spent 15 minutes counseling the patient face to face. The total time  spent in the appointment was 25 minutes.    Bentlee Benningfield, MD 01/21/2013 5:54 PM

## 2013-02-13 ENCOUNTER — Encounter: Payer: Self-pay | Admitting: Family Medicine

## 2013-02-13 ENCOUNTER — Ambulatory Visit (INDEPENDENT_AMBULATORY_CARE_PROVIDER_SITE_OTHER): Payer: 59 | Admitting: Family Medicine

## 2013-02-13 VITALS — BP 110/70 | Temp 97.8°F | Wt 165.0 lb

## 2013-02-13 DIAGNOSIS — I10 Essential (primary) hypertension: Secondary | ICD-10-CM

## 2013-02-13 DIAGNOSIS — E109 Type 1 diabetes mellitus without complications: Secondary | ICD-10-CM

## 2013-02-13 DIAGNOSIS — N529 Male erectile dysfunction, unspecified: Secondary | ICD-10-CM

## 2013-02-13 DIAGNOSIS — C9110 Chronic lymphocytic leukemia of B-cell type not having achieved remission: Secondary | ICD-10-CM

## 2013-02-13 MED ORDER — INSULIN GLARGINE 100 UNIT/ML ~~LOC~~ SOLN: SUBCUTANEOUS | Status: DC

## 2013-02-13 NOTE — Progress Notes (Signed)
   Subjective:    Patient ID: Tyler Scott, male    DOB: 11/04/1955, 58 y.o.   MRN: [id number]  HPI Tyler Scott is a 58 year old male nonsmoker who comes in today for followup of diabetes type 1, erectile dysfunction, hypertension  He also has CLL and is followed in oncology. Most recent white count is 14,654% lymphocytes  Blood sugars at home running in the 150-160 range. A1c was 8.2% now it's down to 7.9% on 15 units of Lantus each bedtime.  He takes lisinopril 40 mg daily for hypertension BP 110/70  He takes Levitra 20 mg when necessary for ED  He takes simvastatin 20 mg daily for hyperlipidemia.   Review of Systems    review of systems otherwise negative except he states he went to the pain center for evaluation. He has some superficial veins and a small Baker's cyst behind his left knee Objective:   Physical Exam  Well-developed well-nourished thin male no acute distress vital signs stable he is afebrile BP 110/70      Assessment & Plan:  Diabetes type 1 not at goal,,,,,,,, increase insulin by 5 units every 2 weeks until fasting blood sugar drops to 120 return in 3 months for followup  Hypertension at goal continue lisinopril  Hyperlipidemia continue Zocor 20 mg and a baby aspirin daily  Erectile dysfunction continue Levitra

## 2013-02-13 NOTE — Patient Instructions (Signed)
Increase the insulin to 20 units  Check a fasting blood sugar daily in the morning  If in 2 weeks your blood sugars down to 120 and continue 20 units daily  If not increase it to 25 units daily  In other words increase her insulin by 5 units every 2 weeks until your fasting blood sugar drops to 120  Return in 3 months for your annual physical examination  Labs one week prior

## 2013-02-15 ENCOUNTER — Ambulatory Visit: Payer: 59

## 2013-02-15 ENCOUNTER — Other Ambulatory Visit: Payer: 59

## 2013-03-22 ENCOUNTER — Encounter (INDEPENDENT_AMBULATORY_CARE_PROVIDER_SITE_OTHER): Payer: Self-pay

## 2013-03-22 ENCOUNTER — Ambulatory Visit (HOSPITAL_BASED_OUTPATIENT_CLINIC_OR_DEPARTMENT_OTHER): Payer: 59 | Admitting: Internal Medicine

## 2013-03-22 ENCOUNTER — Other Ambulatory Visit (HOSPITAL_BASED_OUTPATIENT_CLINIC_OR_DEPARTMENT_OTHER): Payer: 59

## 2013-03-22 VITALS — BP 165/73 | HR 86 | Temp 97.1°F | Resp 18 | Ht 68.0 in | Wt 168.1 lb

## 2013-03-22 DIAGNOSIS — E119 Type 2 diabetes mellitus without complications: Secondary | ICD-10-CM

## 2013-03-22 DIAGNOSIS — C8599 Non-Hodgkin lymphoma, unspecified, extranodal and solid organ sites: Secondary | ICD-10-CM

## 2013-03-22 DIAGNOSIS — C9110 Chronic lymphocytic leukemia of B-cell type not having achieved remission: Secondary | ICD-10-CM

## 2013-03-22 DIAGNOSIS — I10 Essential (primary) hypertension: Secondary | ICD-10-CM

## 2013-03-22 DIAGNOSIS — E109 Type 1 diabetes mellitus without complications: Secondary | ICD-10-CM

## 2013-03-22 LAB — COMPREHENSIVE METABOLIC PANEL (CC13)
ALK PHOS: 78 U/L (ref 40–150)
ALT: 22 U/L (ref 0–55)
AST: 25 U/L (ref 5–34)
Albumin: 4.4 g/dL (ref 3.5–5.0)
Anion Gap: 9 mEq/L (ref 3–11)
BUN: 14.8 mg/dL (ref 7.0–26.0)
CO2: 26 mEq/L (ref 22–29)
CREATININE: 1.1 mg/dL (ref 0.7–1.3)
Calcium: 9.6 mg/dL (ref 8.4–10.4)
Chloride: 105 mEq/L (ref 98–109)
Glucose: 189 mg/dl — ABNORMAL HIGH (ref 70–140)
Potassium: 4.3 mEq/L (ref 3.5–5.1)
Sodium: 140 mEq/L (ref 136–145)
Total Bilirubin: 1.35 mg/dL — ABNORMAL HIGH (ref 0.20–1.20)
Total Protein: 7.2 g/dL (ref 6.4–8.3)

## 2013-03-22 LAB — LACTATE DEHYDROGENASE (CC13): LDH: 158 U/L (ref 125–245)

## 2013-03-22 LAB — CBC WITH DIFFERENTIAL/PLATELET
BASO%: 0.4 % (ref 0.0–2.0)
BASOS ABS: 0.1 10*3/uL (ref 0.0–0.1)
EOS%: 1.4 % (ref 0.0–7.0)
Eosinophils Absolute: 0.2 10*3/uL (ref 0.0–0.5)
HEMATOCRIT: 38.8 % (ref 38.4–49.9)
HEMOGLOBIN: 12.8 g/dL — AB (ref 13.0–17.1)
LYMPH%: 56.5 % — ABNORMAL HIGH (ref 14.0–49.0)
MCH: 30.6 pg (ref 27.2–33.4)
MCHC: 33.1 g/dL (ref 32.0–36.0)
MCV: 92.3 fL (ref 79.3–98.0)
MONO#: 0.7 10*3/uL (ref 0.1–0.9)
MONO%: 4.4 % (ref 0.0–14.0)
NEUT#: 5.6 10*3/uL (ref 1.5–6.5)
NEUT%: 37.3 % — AB (ref 39.0–75.0)
Platelets: 283 10*3/uL (ref 140–400)
RBC: 4.2 10*6/uL (ref 4.20–5.82)
RDW: 13.4 % (ref 11.0–14.6)
WBC: 14.9 10*3/uL — ABNORMAL HIGH (ref 4.0–10.3)
lymph#: 8.4 10*3/uL — ABNORMAL HIGH (ref 0.9–3.3)

## 2013-03-22 NOTE — Progress Notes (Signed)
[facility] OFFICE PROGRESS NOTE  Tyler Man, MD [place] [address]  DIAGNOSIS: CLL (chronic lymphocytic leukemia) - Plan: CBC with Differential, CBC with Differential, Comprehensive metabolic panel  HYPERTENSION  DIABETES MELLITUS, TYPE I  Chief Complaint  Patient presents with  . CLL (chronic lymphocytic leukemia)    CURRENT THERAPY: Watchful observation.   INTERVAL HISTORY: Tyler Scott 58 y.o. male with a history of small lymphocytic lymphoma, Stage III is here for follow-up. He was last seen by me on 01/19/2013. He reports doing well overall.  He reports continued cervical adenopathy right more than left but denies any constitutional symptoms such as weight lost, fevers or night sweats.  He denies inguinal, axillary palpable adenopathy.  He denies pains related to his cervical adenopathy.  He denies early satiety.    MEDICAL HISTORY: Past Medical History  Diagnosis Date  . DIABETES MELLITUS, TYPE I, UNCONTROLLED 12/30/2006  . HYPERLIPIDEMIA 12/30/2006  . HYPERTENSION 12/30/2006  . ERECTILE DYSFUNCTION, ORGANIC 12/30/2006  . LOW BACK PAIN, ACUTE 12/21/2006  . Diabetic retinopathy   . CLL (chronic lymphocytic leukemia)     stage III: asymptomatic; on observation    INTERIM HISTORY: has DIABETES MELLITUS, TYPE I; HYPERLIPIDEMIA; HYPERTENSION; ERECTILE DYSFUNCTION, ORGANIC; LOW BACK PAIN, ACUTE; CLL (chronic lymphocytic leukemia); Hyperkalemia; and Splenomegaly on his problem list.    ALLERGIES:  has No Known Allergies.  MEDICATIONS: has a current medication list which includes the following prescription(s): accu-chek compact plus, aspirin, b-d uf iii mini pen needles, glucose blood, ibuprofen, insulin glargine, pen needles, ketorolac, levitra, lisinopril, and simvastatin.  SURGICAL HISTORY: No past surgical history on file.  REVIEW OF SYSTEMS:   Constitutional: Denies fevers, chills or abnormal weight loss Eyes:  Denies blurriness of vision Ears, nose, mouth, throat, and face: Denies mucositis or sore throat Respiratory: Denies cough, dyspnea or wheezes Cardiovascular: Denies palpitation, chest discomfort or lower extremity swelling Gastrointestinal:  Denies nausea, heartburn or change in bowel habits Skin: Denies abnormal skin rashes Lymphatics: Denies new lymphadenopathy or easy bruising Neurological:Denies numbness, tingling or new weaknesses Behavioral/Psych: Mood is stable, no new changes  All other systems were reviewed with the patient and are negative.  PHYSICAL EXAMINATION: ECOG PERFORMANCE STATUS: 1 - Symptomatic but completely ambulatory  Blood pressure 165/73, pulse 86, temperature 97.1 F (36.2 C), temperature source Oral, resp. rate 18, height 5\' 8"  (1.727 m), weight 168 lb 1.6 oz (76.25 kg).  GENERAL:alert, no distress and comfortable; mildy anxious.  SKIN: skin color, texture, turgor are normal, no rashes or significant lesions EYES: normal, Conjunctiva are pink and non-injected, sclera clear OROPHARYNX:no exudate, no erythema and lips, buccal mucosa, and tongue normal  NECK: supple, thyroid normal size, non-tender, without nodularity LYMPH: palpable lymphadenopathy in the cervical that is shotty with the largest one about 2 cm in the left cervical area. No palpable axillary or supraclavicular lymph nodes LUNGS: clear to auscultation and percussion with normal breathing effort HEART: regular rate & rhythm and no murmurs and no lower extremity edema ABDOMEN:abdomen soft, non-tender and normal bowel sounds ; palpable spleen abut 5 cm below the costal margin.  Musculoskeletal:no cyanosis of digits and no clubbing  NEURO: alert & oriented x 3 with fluent speech, no focal motor/sensory deficits  Labs:  Lab Results  Component Value Date   WBC 14.9* 03/22/2013   HGB 12.8* 03/22/2013   HCT 38.8 03/22/2013   MCV 92.3 03/22/2013   PLT 283 03/22/2013   NEUTROABS 5.6 03/22/2013  Chemistry      Component Value Date/Time   NA 140 03/22/2013 1047   NA 134* 01/17/2013 1227   K 4.3 03/22/2013 1047   K 5.5* 01/17/2013 1227   CL 99 01/17/2013 1227   CL 102 04/25/2012 1003   CO2 26 03/22/2013 1047   CO2 27 01/17/2013 1227   BUN 14.8 03/22/2013 1047   BUN 17 01/17/2013 1227   CREATININE 1.1 03/22/2013 1047   CREATININE 1.0 01/17/2013 1227      Component Value Date/Time   CALCIUM 9.6 03/22/2013 1047   CALCIUM 9.6 01/17/2013 1227   ALKPHOS 78 03/22/2013 1047   ALKPHOS 58 06/26/2011 1317   AST 25 03/22/2013 1047   AST 20 06/26/2011 1317   ALT 22 03/22/2013 1047   ALT 16 06/26/2011 1317   BILITOT 1.35* 03/22/2013 1047   BILITOT 0.7 06/26/2011 1317     Basic Metabolic Panel:  Recent Labs Lab 03/22/13 1047  NA 140  K 4.3  CO2 26  GLUCOSE 189*  BUN 14.8  CREATININE 1.1  CALCIUM 9.6   GFR Estimated Creatinine Clearance: 71.7 ml/min (by C-G formula based on Cr of 1.1).  CBC:  Recent Labs Lab 03/22/13 1046  WBC 14.9*  NEUTROABS 5.6  HGB 12.8*  HCT 38.8  MCV 92.3  PLT 283   Microbiology No results found for this or any previous visit (from the past 240 hour(s)). Surgical pathology: 08/27/2010 Bone Marrow, Aspirate,Biopsy, and Clot, left [facility] - TRILINEAGE HEMATOPOIESIS - TWO SMALL LYMPHOID AGGREGATES PRESENT - SEE COMMENT PERIPHERAL BLOOD: - ATYPICAL CIRCULATING LYMPHOCYTES. RADIOGRAPHIC STUDIES: No results found.  ASSESSMENT: Tyler Scott 58 y.o. male with a history of CLL (chronic lymphocytic leukemia) - Plan: CBC with Differential, CBC with Differential, Comprehensive metabolic panel  HYPERTENSION  DIABETES MELLITUS, TYPE I  PLAN:   1. Small lymphocytic lymphoma, stage III.  -- His white blood cell count is 14.9 today.   He denies any evidence of constitutional symptoms or painful adenopathy.   We discussed the indications for treatment would include severe constitutional symptoms such as anemia/thrombocytopenia, recurrent infections or very bulky  lymph nodes.  He agreed with this plan and will follow up closely.  --Given his age less than 87 and absence of organ dysfunction, will consider fludarbine and cyclophosphamide and rituximab (FCR). We will check for hepatitis B prior to starting.   We will treat if he continues to have increase in his lymph nodes and/or severe symptoms.    2. DM2 on insulin. --Management per his PCP.  3. Hypercholesteremia. --Continue simvastatin.  Management per his PCP.   All questions were answered. The patient knows to call the clinic with any problems, questions or concerns. We can certainly see the patient much sooner if necessary.  I spent 10 minutes counseling the patient face to face. The total time spent in the appointment was 15 minutes.    Asley Baskerville, MD 03/23/2013 5:59 AM

## 2013-03-22 NOTE — Patient Instructions (Signed)
Chronic Lymphocytic Leukemia Chronic lymphocytic leukemia (CLL) is a type of cancer of the bone marrow and blood cells. Bone marrow is the soft, spongy tissue inside your bone. In CLL, the bone marrow makes too many white blood cells that usually fight infection in the body (lymphocytes). CLL usually gets worse slowly and is the most common type of adult leukemia.  RISK FACTORS No one knows the exact cause of CLL. There is a higher risk of CLL in people who:   Are older than 50 years.  Are white.  Are male.  Have a family history of CLL or other cancers of the lymph system.  Are of Russian Jewish or Eastern European Jewish descent.  Have been exposed to certain chemicals, such as Agent Orange (used in the Vietnam War) or other herbicides or insecticides. SYMPTOMS  At first, there may be no symptoms of chronic lymphocytic leukemia. After a while, some symptoms may occur, such as:   Feeling more tired than usual, even after rest.  Unplanned weight loss.  Heavy sweating at night.  Fevers.  Shortness of breath.  Decreased energy.  Paleness.  Painless, swollen lymph nodes.  A feeling of fullness in the upper left part of the abdomen.  Easy bruising or bleeding.  More frequent infections. DIAGNOSIS  Your health care provider may perform the following exams and tests to diagnose CLL:  Physical exam to check for an enlarged spleen, liver, or lymph nodes.  Blood and bone marrow tests to identify the presence of cancer cells. These may include tests such as complete blood count, flow cytometry, immunophenotyping, and fluorescence in situ hybridization (FISH).  CT scan to look for swelling or abnormalities in your spleen, liver, and lymph nodes. TREATMENT  Treatment options for CLL depend on the stage and the presence of symptoms. There are a number of types of treatment used for this condition, including:  Observation.  Targeted drugs. These are drugs that interfere with  chemicals that leukemia cells need in order to grow and multiply. They identify and attack specific cancer cells without harming normal cells.  Chemotherapy drugs. These medicines kill cells that are multiplying quickly, such as leukemia cells.  Radiation.  Surgery to remove the spleen.  Biological therapy. This treatment boosts the ability of your own immune system to fight the leukemia cells.  Bone marrow or peripheral blood stem cell transplant. This treatment allows the patient to receive very high doses of chemotherapy and/or radiation. These high doses kill the cancer cells but also destroy the bone marrow. After treatment is complete, you are given donor bone marrow or stem cells, which will replace the bone marrow. HOME CARE INSTRUCTIONS   Because you have an increased risk of infection, practice good hand washing and avoid being around people who are ill or being in crowded places.  Because you have an increased risk of bleeding and bruising, avoid contact sports or other rough activities.  Only take over-the-counter or prescription medicines for pain, discomfort, or fever as directed by your health care provider.  Although some of your treatments might affect your appetite, try to eat regular, healthy meals.  If you develop any side effects, such as nausea, diarrhea, rash, white patches in your mouth, a sore throat, difficulty swallowing, or severe fatigue, tell your health care provider. He or she may have recommendations of things you can do to improve symptoms.  Consider learning some ways to cope with the stress of having a chronic illness, such as yoga, meditation,   or participating in a support group. SEEK MEDICAL CARE IF:  You develop chest pains.  You notice pain, swelling or redness anywhere in your legs.  You have pain in your belly (abdomen).  You develop new bruises that are getting bigger.  You have painful or more swollen lymph nodes.  You develop bleeding  from your gums, nose, or in your urine or stools.  You are unable to stop throwing up (vomiting).  You cannot keep liquids down.  You feel lightheaded.  You have a fever or persistent symptoms for more than 2 3 days.  You develop a severe stiff neck or headache. SEEK IMMEDIATE MEDICAL CARE IF:  You have trouble breathing or feel short of breath.  You faint. Document Released: 06/14/2008 Document Revised: 09/28/2012 Document Reviewed: 07/21/2012 [employer] Patient Information 2014 [employer].  

## 2013-03-23 ENCOUNTER — Telehealth: Payer: Self-pay | Admitting: *Deleted

## 2013-03-23 NOTE — Telephone Encounter (Signed)
sw pt gv appt for labs 06/19/13 @ 11am, and for 09/19/13 w/ labs@ 10am and ov@ 10:30am. Pt is aware...td

## 2013-03-28 ENCOUNTER — Ambulatory Visit (INDEPENDENT_AMBULATORY_CARE_PROVIDER_SITE_OTHER): Payer: 59 | Admitting: Ophthalmology

## 2013-03-29 ENCOUNTER — Ambulatory Visit: Payer: 59

## 2013-03-29 ENCOUNTER — Other Ambulatory Visit: Payer: 59 | Admitting: Lab

## 2013-03-30 ENCOUNTER — Other Ambulatory Visit: Payer: Self-pay | Admitting: Family Medicine

## 2013-04-06 ENCOUNTER — Ambulatory Visit (INDEPENDENT_AMBULATORY_CARE_PROVIDER_SITE_OTHER): Payer: 59 | Admitting: Ophthalmology

## 2013-04-12 ENCOUNTER — Ambulatory Visit (INDEPENDENT_AMBULATORY_CARE_PROVIDER_SITE_OTHER): Payer: 59 | Admitting: Ophthalmology

## 2013-04-12 DIAGNOSIS — E1165 Type 2 diabetes mellitus with hyperglycemia: Secondary | ICD-10-CM

## 2013-04-12 DIAGNOSIS — I10 Essential (primary) hypertension: Secondary | ICD-10-CM

## 2013-04-12 DIAGNOSIS — H43819 Vitreous degeneration, unspecified eye: Secondary | ICD-10-CM

## 2013-04-12 DIAGNOSIS — H2510 Age-related nuclear cataract, unspecified eye: Secondary | ICD-10-CM

## 2013-04-12 DIAGNOSIS — H35039 Hypertensive retinopathy, unspecified eye: Secondary | ICD-10-CM

## 2013-04-12 DIAGNOSIS — E11359 Type 2 diabetes mellitus with proliferative diabetic retinopathy without macular edema: Secondary | ICD-10-CM

## 2013-04-12 DIAGNOSIS — E1139 Type 2 diabetes mellitus with other diabetic ophthalmic complication: Secondary | ICD-10-CM

## 2013-05-08 ENCOUNTER — Other Ambulatory Visit: Payer: 59

## 2013-05-15 ENCOUNTER — Encounter: Payer: 59 | Admitting: Family Medicine

## 2013-05-24 ENCOUNTER — Other Ambulatory Visit: Payer: Self-pay | Admitting: Family Medicine

## 2013-06-01 ENCOUNTER — Other Ambulatory Visit: Payer: Self-pay | Admitting: Family Medicine

## 2013-06-16 ENCOUNTER — Other Ambulatory Visit: Payer: Self-pay | Admitting: Family Medicine

## 2013-06-19 ENCOUNTER — Other Ambulatory Visit: Payer: 59

## 2013-06-19 ENCOUNTER — Other Ambulatory Visit (HOSPITAL_BASED_OUTPATIENT_CLINIC_OR_DEPARTMENT_OTHER): Payer: 59

## 2013-06-19 DIAGNOSIS — C8599 Non-Hodgkin lymphoma, unspecified, extranodal and solid organ sites: Secondary | ICD-10-CM

## 2013-06-19 DIAGNOSIS — C9110 Chronic lymphocytic leukemia of B-cell type not having achieved remission: Secondary | ICD-10-CM

## 2013-06-19 LAB — CBC WITH DIFFERENTIAL/PLATELET
BASO%: 0.1 % (ref 0.0–2.0)
BASOS ABS: 0 10*3/uL (ref 0.0–0.1)
EOS ABS: 0.4 10*3/uL (ref 0.0–0.5)
EOS%: 2.1 % (ref 0.0–7.0)
HCT: 38.6 % (ref 38.4–49.9)
HEMOGLOBIN: 12.7 g/dL — AB (ref 13.0–17.1)
LYMPH#: 10.8 10*3/uL — AB (ref 0.9–3.3)
LYMPH%: 61.1 % — ABNORMAL HIGH (ref 14.0–49.0)
MCH: 30.3 pg (ref 27.2–33.4)
MCHC: 32.8 g/dL (ref 32.0–36.0)
MCV: 92.4 fL (ref 79.3–98.0)
MONO#: 0.7 10*3/uL (ref 0.1–0.9)
MONO%: 3.7 % (ref 0.0–14.0)
NEUT#: 5.8 10*3/uL (ref 1.5–6.5)
NEUT%: 33 % — AB (ref 39.0–75.0)
Platelets: 273 10*3/uL (ref 140–400)
RBC: 4.18 10*6/uL — ABNORMAL LOW (ref 4.20–5.82)
RDW: 13.6 % (ref 11.0–14.6)
WBC: 17.7 10*3/uL — ABNORMAL HIGH (ref 4.0–10.3)

## 2013-06-19 LAB — TECHNOLOGIST REVIEW

## 2013-07-26 ENCOUNTER — Encounter: Payer: Self-pay | Admitting: Family Medicine

## 2013-07-26 ENCOUNTER — Ambulatory Visit (INDEPENDENT_AMBULATORY_CARE_PROVIDER_SITE_OTHER): Payer: 59 | Admitting: Family Medicine

## 2013-07-26 VITALS — BP 120/70 | Temp 97.4°F | Ht 68.25 in | Wt 161.0 lb

## 2013-07-26 DIAGNOSIS — E109 Type 1 diabetes mellitus without complications: Secondary | ICD-10-CM

## 2013-07-26 DIAGNOSIS — E119 Type 2 diabetes mellitus without complications: Secondary | ICD-10-CM

## 2013-07-26 DIAGNOSIS — I10 Essential (primary) hypertension: Secondary | ICD-10-CM

## 2013-07-26 DIAGNOSIS — R161 Splenomegaly, not elsewhere classified: Secondary | ICD-10-CM

## 2013-07-26 DIAGNOSIS — C9110 Chronic lymphocytic leukemia of B-cell type not having achieved remission: Secondary | ICD-10-CM

## 2013-07-26 DIAGNOSIS — N529 Male erectile dysfunction, unspecified: Secondary | ICD-10-CM

## 2013-07-26 DIAGNOSIS — E785 Hyperlipidemia, unspecified: Secondary | ICD-10-CM

## 2013-07-26 LAB — LIPID PANEL
CHOL/HDL RATIO: 3
Cholesterol: 177 mg/dL (ref 0–200)
HDL: 50.9 mg/dL (ref >39.00)
LDL CALC: 119 mg/dL — AB (ref 0–99)
NonHDL: 126.1
Triglycerides: 34 mg/dL (ref 0.0–149.0)
VLDL: 6.8 mg/dL (ref 0.0–40.0)

## 2013-07-26 LAB — POCT URINALYSIS DIPSTICK
Bilirubin, UA: NEGATIVE
Blood, UA: NEGATIVE
Glucose, UA: NEGATIVE
KETONES UA: NEGATIVE
LEUKOCYTES UA: NEGATIVE
Nitrite, UA: NEGATIVE
PH UA: 5.5
Protein, UA: NEGATIVE
Spec Grav, UA: >=1.03
Urobilinogen, UA: 0.2

## 2013-07-26 LAB — HEMOGLOBIN A1C: HEMOGLOBIN A1C: 7.8 % — AB (ref 4.6–6.5)

## 2013-07-26 LAB — BASIC METABOLIC PANEL
BUN: 21 mg/dL (ref 6–23)
CO2: 29 mEq/L (ref 19–32)
CREATININE: 0.9 mg/dL (ref 0.4–1.5)
Calcium: 9.5 mg/dL (ref 8.4–10.5)
Chloride: 108 mEq/L (ref 96–112)
GFR: 110.24 mL/min (ref >60.00)
Glucose, Bld: 83 mg/dL (ref 70–99)
Potassium: 4.6 mEq/L (ref 3.5–5.1)
Sodium: 142 mEq/L (ref 135–145)

## 2013-07-26 LAB — MICROALBUMIN / CREATININE URINE RATIO
CREATININE, U: 175 mg/dL
Microalb Creat Ratio: 0.6 mg/g (ref 0.0–30.0)
Microalb, Ur: 1 mg/dL (ref 0.0–1.9)

## 2013-07-26 LAB — CBC WITH DIFFERENTIAL/PLATELET
BASOS PCT: 0.7 % (ref 0.0–3.0)
Basophils Absolute: 0.1 10*3/uL (ref 0.0–0.1)
Eosinophils Absolute: 0.4 10*3/uL (ref 0.0–0.7)
Eosinophils Relative: 2.2 % (ref 0.0–5.0)
HEMATOCRIT: 38.1 % — AB (ref 39.0–52.0)
Hemoglobin: 12.6 g/dL — ABNORMAL LOW (ref 13.0–17.0)
Lymphocytes Relative: 67.2 % — ABNORMAL HIGH (ref 12.0–46.0)
Lymphs Abs: 11.7 10*3/uL — ABNORMAL HIGH (ref 0.7–4.0)
MCHC: 33.2 g/dL (ref 30.0–36.0)
MCV: 92.6 fl (ref 78.0–100.0)
MONOS PCT: 3.8 % (ref 3.0–12.0)
Monocytes Absolute: 0.7 10*3/uL (ref 0.1–1.0)
Neutro Abs: 4.5 10*3/uL (ref 1.4–7.7)
Neutrophils Relative %: 26.1 % — ABNORMAL LOW (ref 43.0–77.0)
PLATELETS: 261 10*3/uL (ref 150.0–400.0)
RBC: 4.11 Mil/uL — AB (ref 4.22–5.81)
RDW: 13.4 % (ref 11.5–15.5)
WBC: 17.4 10*3/uL — AB (ref 4.0–10.5)

## 2013-07-26 LAB — HEPATIC FUNCTION PANEL
ALT: 13 U/L (ref 0–53)
AST: 17 U/L (ref 0–37)
Albumin: 4.5 g/dL (ref 3.5–5.2)
Alkaline Phosphatase: 66 U/L (ref 39–117)
BILIRUBIN TOTAL: 1 mg/dL (ref 0.2–1.2)
Bilirubin, Direct: 0.2 mg/dL (ref 0.0–0.3)
Total Protein: 6.9 g/dL (ref 6.0–8.3)

## 2013-07-26 LAB — TSH: TSH: 1.18 u[IU]/mL (ref 0.35–4.50)

## 2013-07-26 LAB — PSA: PSA: 0.96 ng/mL (ref 0.10–4.00)

## 2013-07-26 MED ORDER — LISINOPRIL 40 MG PO TABS: ORAL_TABLET | ORAL | Status: DC

## 2013-07-26 MED ORDER — GLUCOSE BLOOD VI STRP: ORAL_STRIP | Status: DC

## 2013-07-26 MED ORDER — SIMVASTATIN 20 MG PO TABS: ORAL_TABLET | ORAL | Status: DC

## 2013-07-26 MED ORDER — INSULIN GLARGINE 100 UNIT/ML ~~LOC~~ SOLN: SUBCUTANEOUS | Status: DC

## 2013-07-26 MED ORDER — VARDENAFIL HCL 20 MG PO TABS: ORAL_TABLET | ORAL | Status: DC

## 2013-07-26 NOTE — Patient Instructions (Signed)
Continue your current medications  In the future if you have any, musculoskeletal pain the most Motrin you could take would be 400 mg twice daily with food............ and only take it for one week at a time no more  Labs today  Consult with [place]........Marland Kitchen Dr. Wylene Simmer........ foot specialist  We will call you next week when we gets her lab work back

## 2013-07-26 NOTE — Progress Notes (Signed)
   Subjective:    Patient ID: Tyler Scott, male    DOB: [birth year], 58 y.o.   MRN: [id number]  HPI Tyler Scott is a 58 year old married male nonsmoker who comes in today for annual physical examination because of a history of type 1 diabetes, erectile dysfunction, hypertension, hyperlipidemia  His medicines reviewed the been unchanged.  He gets routine eye care, dental care, colonoscopy and GI couple years ago normal  Vaccinations updated by Tyler Scott  For some reason did not get his lab work last week  He states he was in [employer] had a pin inserted in his foot and would like to see a foot specialist. He's also has some back problems and wants a back evaluation. I will refer him to [facility] for both evaluation .   Review of Systems  Constitutional: Negative.   HENT: Negative.   Eyes: Negative.   Respiratory: Negative.   Cardiovascular: Negative.   Gastrointestinal: Negative.   Genitourinary: Negative.   Musculoskeletal: Negative.   Skin: Negative.   Neurological: Negative.   Psychiatric/Behavioral: Negative.        Objective:   Physical Exam  Nursing note and vitals reviewed. Constitutional: He is oriented to person, place, and time. He appears well-developed and well-nourished.  HENT:  Head: Normocephalic and atraumatic.  Right Ear: External ear normal.  Left Ear: External ear normal.  Nose: Nose normal.  Mouth/Throat: Oropharynx is clear and moist.  Eyes: Conjunctivae and EOM are normal. Pupils are equal, round, and reactive to light.  Neck: Normal range of motion. Neck supple. No JVD present. No tracheal deviation present. No thyromegaly present.  Cardiovascular: Normal rate, regular rhythm, normal heart sounds and intact distal pulses.  Exam reveals no gallop and no friction rub.   No murmur heard. No carotid nor earache bruits peripheral pulses 2+ and symmetrical  Pulmonary/Chest: Effort normal and breath sounds normal. No stridor. No  respiratory distress. He has no wheezes. He has no rales. He exhibits no tenderness.  Abdominal: Soft. Bowel sounds are normal. He exhibits no distension and no mass. There is no tenderness. There is no rebound and no guarding.  Genitourinary: Rectum normal, prostate normal and penis normal. Guaiac negative stool. No penile tenderness.  Musculoskeletal: Normal range of motion. He exhibits no edema and no tenderness.  Lymphadenopathy:    He has no cervical adenopathy.  Neurological: He is alert and oriented to person, place, and time. He has normal reflexes. No cranial nerve deficit. He exhibits normal muscle tone.  No neuropathy  Skin: Skin is warm and dry. No rash noted. No erythema. No pallor.  Psychiatric: He has a normal mood and affect. His behavior is normal. Judgment and thought content normal.          Assessment & Plan:  Healthy male  Diabetes type 1 continue current therapy check labs  Hyperlipidemia continue Zocor 20 mg daily and an aspirin tablet,,,, check labs  Hypertension at goal on lisinopril 40 mg daily continue that dose,,, check labs  Pain left great toe......... surgery in the [employer]...Marland KitchenMarland KitchenMarland Kitchen referred to Dr. Wylene Scott for further evaluation

## 2013-07-26 NOTE — Progress Notes (Signed)
Pre visit review using our clinic review tool, if applicable. No additional management support is needed unless otherwise documented below in the visit note. 

## 2013-07-27 ENCOUNTER — Telehealth: Payer: Self-pay | Admitting: Family Medicine

## 2013-07-27 NOTE — Telephone Encounter (Signed)
Relevant patient education mailed to patient.  

## 2013-07-29 ENCOUNTER — Other Ambulatory Visit: Payer: Self-pay | Admitting: Family Medicine

## 2013-07-31 ENCOUNTER — Other Ambulatory Visit: Payer: Self-pay | Admitting: Family Medicine

## 2013-09-11 ENCOUNTER — Telehealth: Payer: Self-pay | Admitting: Internal Medicine

## 2013-09-11 NOTE — Telephone Encounter (Signed)
, °

## 2013-09-12 ENCOUNTER — Other Ambulatory Visit: Payer: 59

## 2013-09-19 ENCOUNTER — Other Ambulatory Visit: Payer: 59

## 2013-09-19 ENCOUNTER — Ambulatory Visit: Payer: 59

## 2013-09-19 ENCOUNTER — Encounter: Payer: 59 | Admitting: Family Medicine

## 2013-09-29 ENCOUNTER — Telehealth: Payer: Self-pay | Admitting: Internal Medicine

## 2013-09-29 ENCOUNTER — Other Ambulatory Visit (HOSPITAL_BASED_OUTPATIENT_CLINIC_OR_DEPARTMENT_OTHER): Payer: 59

## 2013-09-29 ENCOUNTER — Ambulatory Visit (HOSPITAL_BASED_OUTPATIENT_CLINIC_OR_DEPARTMENT_OTHER): Payer: 59 | Admitting: Internal Medicine

## 2013-09-29 VITALS — BP 144/79 | HR 80 | Temp 97.6°F | Resp 18 | Ht 68.0 in | Wt 160.2 lb

## 2013-09-29 DIAGNOSIS — C9110 Chronic lymphocytic leukemia of B-cell type not having achieved remission: Secondary | ICD-10-CM

## 2013-09-29 DIAGNOSIS — E7800 Pure hypercholesterolemia, unspecified: Secondary | ICD-10-CM

## 2013-09-29 DIAGNOSIS — E119 Type 2 diabetes mellitus without complications: Secondary | ICD-10-CM

## 2013-09-29 LAB — COMPREHENSIVE METABOLIC PANEL (CC13)
ALT: 11 U/L (ref 0–55)
ANION GAP: 8 meq/L (ref 3–11)
AST: 18 U/L (ref 5–34)
Albumin: 4.2 g/dL (ref 3.5–5.0)
Alkaline Phosphatase: 67 U/L (ref 40–150)
BILIRUBIN TOTAL: 1.05 mg/dL (ref 0.20–1.20)
BUN: 16.1 mg/dL (ref 7.0–26.0)
CHLORIDE: 105 meq/L (ref 98–109)
CO2: 27 meq/L (ref 22–29)
Calcium: 9.5 mg/dL (ref 8.4–10.4)
Creatinine: 1.1 mg/dL (ref 0.7–1.3)
Glucose: 188 mg/dl — ABNORMAL HIGH (ref 70–140)
Potassium: 4.3 mEq/L (ref 3.5–5.1)
Sodium: 140 mEq/L (ref 136–145)
Total Protein: 7.1 g/dL (ref 6.4–8.3)

## 2013-09-29 LAB — CBC WITH DIFFERENTIAL/PLATELET
BASO%: 0.2 % (ref 0.0–2.0)
Basophils Absolute: 0 10*3/uL (ref 0.0–0.1)
EOS%: 1.6 % (ref 0.0–7.0)
Eosinophils Absolute: 0.3 10*3/uL (ref 0.0–0.5)
HCT: 38.6 % (ref 38.4–49.9)
HGB: 12.9 g/dL — ABNORMAL LOW (ref 13.0–17.1)
LYMPH#: 14.1 10*3/uL — AB (ref 0.9–3.3)
LYMPH%: 71 % — ABNORMAL HIGH (ref 14.0–49.0)
MCH: 30.4 pg (ref 27.2–33.4)
MCHC: 33.4 g/dL (ref 32.0–36.0)
MCV: 90.8 fL (ref 79.3–98.0)
MONO#: 1.1 10*3/uL — AB (ref 0.1–0.9)
MONO%: 5.5 % (ref 0.0–14.0)
NEUT#: 4.3 10*3/uL (ref 1.5–6.5)
NEUT%: 21.7 % — ABNORMAL LOW (ref 39.0–75.0)
Platelets: 258 10*3/uL (ref 140–400)
RBC: 4.25 10*6/uL (ref 4.20–5.82)
RDW: 13.8 % (ref 11.0–14.6)
WBC: 19.8 10*3/uL — AB (ref 4.0–10.3)

## 2013-09-29 LAB — TECHNOLOGIST REVIEW

## 2013-09-29 NOTE — Progress Notes (Signed)
[facility] OFFICE PROGRESS NOTE  [name], MD [place] [address]  DIAGNOSIS: CLL (chronic lymphocytic leukemia) - Plan: CBC with Differential, CBC with Differential, Basic metabolic panel (Bmet) - [facility], Lactate dehydrogenase (LDH) - [facility]  Chief Complaint  Patient presents with  . CLL (chronic lymphocytic leukemia)    CURRENT THERAPY: Watchful observation.   INTERVAL HISTORY: Tyler Scott 58 y.o. male with a history of small lymphocytic lymphoma, Stage III is here for follow-up. He was last seen by me on 03/22/2013. He reports doing well overall.  He reports continued cervical adenopathy right more than left but denies any constitutional symptoms such as weight lost, fevers or night sweats.  He denies inguinal, axillary palpable adenopathy.  He denies pains related to his cervical adenopathy.  He denies early satiety.  He reports that his brother passed away from colon cancer.  He is uptodate for his colon cancer screening, he reports.  MEDICAL HISTORY: Past Medical History  Diagnosis Date  . DIABETES MELLITUS, TYPE I, UNCONTROLLED 12/30/2006  . HYPERLIPIDEMIA 12/30/2006  . HYPERTENSION 12/30/2006  . ERECTILE DYSFUNCTION, ORGANIC 12/30/2006  . LOW BACK PAIN, ACUTE 12/21/2006  . Diabetic retinopathy   . CLL (chronic lymphocytic leukemia)     stage III: asymptomatic; on observation    INTERIM HISTORY: has DIABETES MELLITUS, TYPE I; HYPERLIPIDEMIA; HYPERTENSION; ERECTILE DYSFUNCTION, ORGANIC; LOW BACK PAIN, ACUTE; CLL (chronic lymphocytic leukemia); Hyperkalemia; and Splenomegaly on his problem list.    ALLERGIES:  has No Known Allergies.  MEDICATIONS: has a current medication list which includes the following prescription(s): accu-chek compact plus, aspirin, b-d uf iii mini pen needles, b-d uf iii mini pen needles, glucose blood, insulin glargine, pen needles, ketorolac, lisinopril, simvastatin, and vardenafil.  SURGICAL  HISTORY: No past surgical history on file.  REVIEW OF SYSTEMS:   Constitutional: Denies fevers, chills or abnormal weight loss Eyes: Denies blurriness of vision Ears, nose, mouth, throat, and face: Denies mucositis or sore throat Respiratory: Denies cough, dyspnea or wheezes Cardiovascular: Denies palpitation, chest discomfort or lower extremity swelling Gastrointestinal:  Denies nausea, heartburn or change in bowel habits Skin: Denies abnormal skin rashes Lymphatics: Denies new lymphadenopathy or easy bruising Neurological:Denies numbness, tingling or new weaknesses Behavioral/Psych: Mood is stable, no new changes  All other systems were reviewed with the patient and are negative.  PHYSICAL EXAMINATION: ECOG PERFORMANCE STATUS: 1 - Symptomatic but completely ambulatory  Blood pressure 144/79, pulse 80, temperature 97.6 F (36.4 C), temperature source Oral, resp. rate 18, height 5\' 8"  (1.727 m), weight 160 lb 3.2 oz (72.666 kg), SpO2 100.00%.  GENERAL:alert, no distress and comfortable; mildy anxious.  SKIN: skin color, texture, turgor are normal, no rashes or significant lesions EYES: normal, Conjunctiva are pink and non-injected, sclera clear OROPHARYNX:no exudate, no erythema and lips, buccal mucosa, and tongue normal  NECK: supple, thyroid normal size, non-tender, without nodularity LYMPH: palpable lymphadenopathy in the cervical that is shotty with the largest one about 2 cm in the left cervical area. No palpable axillary or supraclavicular lymph nodes LUNGS: clear to auscultation and percussion with normal breathing effort HEART: regular rate & rhythm and no murmurs and no lower extremity edema ABDOMEN:abdomen soft, non-tender and normal bowel sounds ; palpable spleen abut 5 cm below the costal margin.  Musculoskeletal:no cyanosis of digits and no clubbing  NEURO: alert & oriented x 3 with fluent speech, no focal motor/sensory deficits  Labs:  Lab Results  Component Value  Date   WBC 19.8*  09/29/2013   HGB 12.9* 09/29/2013   HCT 38.6 09/29/2013   MCV 90.8 09/29/2013   PLT 258 09/29/2013   NEUTROABS 4.3 09/29/2013      Chemistry      Component Value Date/Time   NA 140 09/29/2013 1026   NA 142 07/26/2013 1113   K 4.3 09/29/2013 1026   K 4.6 07/26/2013 1113   CL 108 07/26/2013 1113   CL 102 04/25/2012 1003   CO2 27 09/29/2013 1026   CO2 29 07/26/2013 1113   BUN 16.1 09/29/2013 1026   BUN 21 07/26/2013 1113   CREATININE 1.1 09/29/2013 1026   CREATININE 0.9 07/26/2013 1113      Component Value Date/Time   CALCIUM 9.5 09/29/2013 1026   CALCIUM 9.5 07/26/2013 1113   ALKPHOS 67 09/29/2013 1026   ALKPHOS 66 07/26/2013 1113   AST 18 09/29/2013 1026   AST 17 07/26/2013 1113   ALT 11 09/29/2013 1026   ALT 13 07/26/2013 1113   BILITOT 1.05 09/29/2013 1026   BILITOT 1.0 07/26/2013 1113     Basic Metabolic Panel:  Recent Labs Lab 09/29/13 1026  NA 140  K 4.3  CO2 27  GLUCOSE 188*  BUN 16.1  CREATININE 1.1  CALCIUM 9.5   GFR Estimated Creatinine Clearance: 71.7 ml/min (by C-G formula based on Cr of 1.1).  CBC:  Recent Labs Lab 09/29/13 1026  WBC 19.8*  NEUTROABS 4.3  HGB 12.9*  HCT 38.6  MCV 90.8  PLT 258   Microbiology Recent Results (from the past 240 hour(s))  TECHNOLOGIST REVIEW     Status: None   Collection Time    09/29/13 10:26 AM      Result Value Ref Range Status   Technologist Review Variant lymphs present   Final   Surgical pathology: 08/27/2010 Bone Marrow, Aspirate,Biopsy, and Clot, left [facility] - TRILINEAGE HEMATOPOIESIS - TWO SMALL LYMPHOID AGGREGATES PRESENT - SEE COMMENT PERIPHERAL BLOOD: - ATYPICAL CIRCULATING LYMPHOCYTES. RADIOGRAPHIC STUDIES: No results found.  ASSESSMENT: Tyler Scott 58 y.o. male with a history of CLL (chronic lymphocytic leukemia) - Plan: CBC with Differential, CBC with Differential, Basic metabolic panel (Bmet) - [facility], Lactate dehydrogenase (LDH) - [facility]  PLAN:   1. Small lymphocytic lymphoma, stage  III. w diffuse adenopathy and splenomegaly.  - His white blood cell count is 19.8 today.   -Status: Still no indication for treatment. Indications for treatment include: Severe constitutional symptoms, severe anemia/low platelet count, recurrent infection, or very bulky nodes.  He denies any evidence of constitutional symptoms or painful adenopathy.   We discussed the indications for treatment would include severe constitutional symptoms such as anemia/thrombocytopenia, recurrent infections or very bulky lymph nodes.  He agreed with this plan and will follow up closely.  --Given his age less than 51 and absence of organ dysfunction, will consider fludarbine and cyclophosphamide and rituximab (FCR). We will check for hepatitis B prior to starting.   We will treat if he continues to have increase in his lymph nodes and/or severe symptoms.    2. DM2 on insulin. --Management per his PCP.  3. Hypercholesteremia. --Continue simvastatin.  Management per his PCP.   All questions were answered. The patient knows to call the clinic with any problems, questions or concerns. We can certainly see the patient much sooner if necessary.  I spent 10 minutes counseling the patient face to face. The total time spent in the appointment was 15 minutes.    Avontae Burkhead, MD 09/29/2013 1:36 PM

## 2013-09-29 NOTE — Telephone Encounter (Signed)
Pt confirmed labs/ov per 08/21 POF, gave pt AVS....KJ

## 2013-10-17 ENCOUNTER — Ambulatory Visit (INDEPENDENT_AMBULATORY_CARE_PROVIDER_SITE_OTHER): Payer: 59 | Admitting: Ophthalmology

## 2013-10-27 ENCOUNTER — Ambulatory Visit (INDEPENDENT_AMBULATORY_CARE_PROVIDER_SITE_OTHER): Payer: 59 | Admitting: Ophthalmology

## 2013-10-27 DIAGNOSIS — E1165 Type 2 diabetes mellitus with hyperglycemia: Secondary | ICD-10-CM

## 2013-10-27 DIAGNOSIS — I10 Essential (primary) hypertension: Secondary | ICD-10-CM

## 2013-10-27 DIAGNOSIS — E11359 Type 2 diabetes mellitus with proliferative diabetic retinopathy without macular edema: Secondary | ICD-10-CM

## 2013-10-27 DIAGNOSIS — H2510 Age-related nuclear cataract, unspecified eye: Secondary | ICD-10-CM

## 2013-10-27 DIAGNOSIS — H43819 Vitreous degeneration, unspecified eye: Secondary | ICD-10-CM

## 2013-10-27 DIAGNOSIS — E1139 Type 2 diabetes mellitus with other diabetic ophthalmic complication: Secondary | ICD-10-CM

## 2013-10-27 DIAGNOSIS — H35039 Hypertensive retinopathy, unspecified eye: Secondary | ICD-10-CM

## 2013-12-03 ENCOUNTER — Other Ambulatory Visit: Payer: Self-pay | Admitting: Family Medicine

## 2013-12-27 ENCOUNTER — Telehealth: Payer: Self-pay | Admitting: Hematology

## 2013-12-27 NOTE — Telephone Encounter (Signed)
Pt called and confirm appt for Nov.

## 2013-12-28 ENCOUNTER — Other Ambulatory Visit: Payer: Self-pay | Admitting: *Deleted

## 2013-12-28 DIAGNOSIS — C9110 Chronic lymphocytic leukemia of B-cell type not having achieved remission: Secondary | ICD-10-CM

## 2013-12-29 ENCOUNTER — Other Ambulatory Visit (HOSPITAL_BASED_OUTPATIENT_CLINIC_OR_DEPARTMENT_OTHER): Payer: 59

## 2013-12-29 DIAGNOSIS — C9110 Chronic lymphocytic leukemia of B-cell type not having achieved remission: Secondary | ICD-10-CM

## 2013-12-29 LAB — CBC WITH DIFFERENTIAL/PLATELET
BASO%: 0.1 % (ref 0.0–2.0)
BASOS ABS: 0 10*3/uL (ref 0.0–0.1)
EOS%: 1 % (ref 0.0–7.0)
Eosinophils Absolute: 0.2 10*3/uL (ref 0.0–0.5)
HEMATOCRIT: 39.9 % (ref 38.4–49.9)
HEMOGLOBIN: 13.2 g/dL (ref 13.0–17.1)
LYMPH%: 71.9 % — ABNORMAL HIGH (ref 14.0–49.0)
MCH: 30 pg (ref 27.2–33.4)
MCHC: 33.1 g/dL (ref 32.0–36.0)
MCV: 90.7 fL (ref 79.3–98.0)
MONO#: 1.3 10*3/uL — AB (ref 0.1–0.9)
MONO%: 5.4 % (ref 0.0–14.0)
NEUT#: 5 10*3/uL (ref 1.5–6.5)
NEUT%: 21.6 % — AB (ref 39.0–75.0)
Platelets: 302 10*3/uL (ref 140–400)
RBC: 4.4 10*6/uL (ref 4.20–5.82)
RDW: 13.4 % (ref 11.0–14.6)
WBC: 23.3 10*3/uL — AB (ref 4.0–10.3)
lymph#: 16.8 10*3/uL — ABNORMAL HIGH (ref 0.9–3.3)

## 2013-12-29 LAB — COMPREHENSIVE METABOLIC PANEL (CC13)
ALBUMIN: 4.6 g/dL (ref 3.5–5.0)
ALT: 16 U/L (ref 0–55)
ANION GAP: 10 meq/L (ref 3–11)
AST: 16 U/L (ref 5–34)
Alkaline Phosphatase: 87 U/L (ref 40–150)
BUN: 20.6 mg/dL (ref 7.0–26.0)
CALCIUM: 9.8 mg/dL (ref 8.4–10.4)
CO2: 24 meq/L (ref 22–29)
Chloride: 102 mEq/L (ref 98–109)
Creatinine: 1.1 mg/dL (ref 0.7–1.3)
Glucose: 252 mg/dl — ABNORMAL HIGH (ref 70–140)
POTASSIUM: 5.4 meq/L — AB (ref 3.5–5.1)
Sodium: 136 mEq/L (ref 136–145)
Total Bilirubin: 0.97 mg/dL (ref 0.20–1.20)
Total Protein: 7.8 g/dL (ref 6.4–8.3)

## 2013-12-29 LAB — TECHNOLOGIST REVIEW

## 2014-03-06 ENCOUNTER — Telehealth: Payer: Self-pay | Admitting: Oncology

## 2014-03-30 ENCOUNTER — Other Ambulatory Visit: Payer: 59

## 2014-03-30 ENCOUNTER — Ambulatory Visit: Payer: 59

## 2014-04-01 ENCOUNTER — Other Ambulatory Visit: Payer: Self-pay | Admitting: Family Medicine

## 2014-04-26 ENCOUNTER — Encounter: Payer: Self-pay | Admitting: Family Medicine

## 2014-04-26 ENCOUNTER — Encounter: Payer: Self-pay | Admitting: Nurse Practitioner

## 2014-04-26 ENCOUNTER — Ambulatory Visit (INDEPENDENT_AMBULATORY_CARE_PROVIDER_SITE_OTHER): Payer: 59 | Admitting: Family Medicine

## 2014-04-26 VITALS — BP 130/80 | Temp 97.7°F | Wt 166.5 lb

## 2014-04-26 DIAGNOSIS — E1040 Type 1 diabetes mellitus with diabetic neuropathy, unspecified: Secondary | ICD-10-CM

## 2014-04-26 LAB — BASIC METABOLIC PANEL
BUN: 15 mg/dL (ref 6–23)
CALCIUM: 9.5 mg/dL (ref 8.4–10.5)
CO2: 30 mEq/L (ref 19–32)
CREATININE: 0.94 mg/dL (ref 0.40–1.50)
Chloride: 103 mEq/L (ref 96–112)
GFR: 105.91 mL/min (ref >60.00)
GLUCOSE: 155 mg/dL — AB (ref 70–99)
Potassium: 4 mEq/L (ref 3.5–5.1)
SODIUM: 137 meq/L (ref 135–145)

## 2014-04-26 LAB — MICROALBUMIN / CREATININE URINE RATIO
CREATININE, U: 109.6 mg/dL
MICROALB UR: 1.5 mg/dL (ref 0.0–1.9)
MICROALB/CREAT RATIO: 1.4 mg/g (ref 0.0–30.0)

## 2014-04-26 LAB — HEMOGLOBIN A1C: Hgb A1c MFr Bld: 8 % — ABNORMAL HIGH (ref 4.6–6.5)

## 2014-04-26 NOTE — Progress Notes (Signed)
Pre visit review using our clinic review tool, if applicable. No additional management support is needed unless otherwise documented below in the visit note. 

## 2014-04-26 NOTE — Progress Notes (Signed)
   Subjective:    Patient ID: Tyler Scott, male    DOB: Mar 24, 1955, 59 y.o.   MRN: [id number]  HPI  Tyler Scott is a 59 year old male who comes in today for follow-up of diabetes  We last saw him in June 2015 his hemoglobin A1c at that time was 7.8%. He is intolerant of oral medications. He is on Lantus 20 units at bedtime. He says he checks his blood sugar every morning and it averages 136.  He's had some fullness and itching in his left ear  He's followed up in oncology because of CLL   Review of Systems Review of systems otherwise negative    Objective:   Physical Exam  Well-developed well-nourished thin male no acute distress vital signs stable he is afebrile BP 130/80  The left ear canal is red and irritated..... He's been using Q-tips.......Marland Kitchen right ear canal full of wax      Assessment & Plan:  Diabetes type 1... Check labs follow-up CPX in the summer .......  Cerumen impaction right ear .......Marland Kitchen ear drops at bedtime for 2 weeks and flushed with warm water  Dryness left ear canal ......... OTC cortisone cream at bedtime for 10 days ...Marland KitchenMarland KitchenMarland Kitchen

## 2014-04-26 NOTE — Patient Instructions (Addendum)
Continue current medications  Labs today.......Marland Kitchen we will call you the report in make any adjustments to medication by 5  Return in June for your annual exam,,,,,,,,,,, Tyler Scott  OTC cortisone cream...Marland KitchenMarland KitchenMarland Kitchen small amounts on a Q-tip left ear canal bedtime for 10 nights  Earwax dissolving drops....... 2 drops right ear canal bedtime... Pack with cotton......Marland Kitchen remove the cotton in the morning..... After 2 weeks of the nighttime drops flush that right ear canal with warm water

## 2014-05-01 ENCOUNTER — Other Ambulatory Visit: Payer: Self-pay | Admitting: Oncology

## 2014-05-01 ENCOUNTER — Telehealth: Payer: Self-pay | Admitting: Oncology

## 2014-05-01 ENCOUNTER — Ambulatory Visit (HOSPITAL_BASED_OUTPATIENT_CLINIC_OR_DEPARTMENT_OTHER): Payer: 59 | Admitting: Oncology

## 2014-05-01 ENCOUNTER — Other Ambulatory Visit (HOSPITAL_BASED_OUTPATIENT_CLINIC_OR_DEPARTMENT_OTHER): Payer: 59

## 2014-05-01 VITALS — BP 152/73 | HR 76 | Temp 97.3°F | Resp 19 | Ht 68.0 in | Wt 168.0 lb

## 2014-05-01 DIAGNOSIS — C8308 Small cell B-cell lymphoma, lymph nodes of multiple sites: Secondary | ICD-10-CM

## 2014-05-01 DIAGNOSIS — C9110 Chronic lymphocytic leukemia of B-cell type not having achieved remission: Secondary | ICD-10-CM

## 2014-05-01 LAB — COMPREHENSIVE METABOLIC PANEL (CC13)
ALK PHOS: 78 U/L (ref 40–150)
ALT: 18 U/L (ref 0–55)
AST: 18 U/L (ref 5–34)
Albumin: 4.4 g/dL (ref 3.5–5.0)
Anion Gap: 11 mEq/L (ref 3–11)
BILIRUBIN TOTAL: 0.91 mg/dL (ref 0.20–1.20)
BUN: 19.9 mg/dL (ref 7.0–26.0)
CO2: 26 mEq/L (ref 22–29)
Calcium: 9.4 mg/dL (ref 8.4–10.4)
Chloride: 103 mEq/L (ref 98–109)
Creatinine: 1 mg/dL (ref 0.7–1.3)
EGFR: >90 mL/min/{1.73_m2} (ref >90)
Glucose: 130 mg/dl (ref 70–140)
Potassium: 4.5 mEq/L (ref 3.5–5.1)
SODIUM: 140 meq/L (ref 136–145)
TOTAL PROTEIN: 7.6 g/dL (ref 6.4–8.3)

## 2014-05-01 LAB — CBC WITH DIFFERENTIAL/PLATELET
BASO%: 0.2 % (ref 0.0–2.0)
Basophils Absolute: 0 10*3/uL (ref 0.0–0.1)
EOS%: 1 % (ref 0.0–7.0)
Eosinophils Absolute: 0.3 10*3/uL (ref 0.0–0.5)
HCT: 40.8 % (ref 38.4–49.9)
HGB: 13.4 g/dL (ref 13.0–17.1)
LYMPH%: 72.7 % — ABNORMAL HIGH (ref 14.0–49.0)
MCH: 30 pg (ref 27.2–33.4)
MCHC: 32.9 g/dL (ref 32.0–36.0)
MCV: 91 fL (ref 79.3–98.0)
MONO#: 0.8 10*3/uL (ref 0.1–0.9)
MONO%: 2.9 % (ref 0.0–14.0)
NEUT#: 6.2 10*3/uL (ref 1.5–6.5)
NEUT%: 23.2 % — ABNORMAL LOW (ref 39.0–75.0)
PLATELETS: 279 10*3/uL (ref 140–400)
RBC: 4.48 10*6/uL (ref 4.20–5.82)
RDW: 13 % (ref 11.0–14.6)
WBC: 26.6 10*3/uL — AB (ref 4.0–10.3)
lymph#: 19.4 10*3/uL — ABNORMAL HIGH (ref 0.9–3.3)

## 2014-05-01 LAB — TECHNOLOGIST REVIEW

## 2014-05-01 NOTE — Telephone Encounter (Signed)
gv and rpinted appt sched and avs for pt for June...gv barium

## 2014-05-01 NOTE — Progress Notes (Signed)
[facility] OFFICE PROGRESS NOTE  [name], MD [place] [address]  DIAGNOSIS: 59 year old gentleman diagnosed with small lymphocytic lymphoma in 2012. He presented with leukocytosis and diffuse lymphadenopathy. He has stage III disease upon presentation.  CURRENT THERAPY: Watchful observation.   INTERVAL HISTORY: ASHVIK GRUNDMAN 59 y.o. male presents today for a follow-up visit. Since his last visit, he reports doing well overall.  He reports continued cervical adenopathy right more than left but denies any constitutional symptoms such as weight lost, fevers or night sweats.  He denies inguinal, axillary palpable adenopathy.  He denies pains related to his cervical adenopathy.  He denies early satiety.  He does not report any fevers, chills, sweats or any constitutional symptoms. He continues to work full-time without any decline. He does not report any chest pain or palpitation. He does not report any nausea, vomiting, abdominal pain. Does not report any genitourinary complaints. Does not report any skeletal complaints. Rest of review of systems unremarkable. MEDICAL HISTORY: Past Medical History  Diagnosis Date  . DIABETES MELLITUS, TYPE I, UNCONTROLLED 12/30/2006  . HYPERLIPIDEMIA 12/30/2006  . HYPERTENSION 12/30/2006  . ERECTILE DYSFUNCTION, ORGANIC 12/30/2006  . LOW BACK PAIN, ACUTE 12/21/2006  . Diabetic retinopathy   . CLL (chronic lymphocytic leukemia)     stage III: asymptomatic; on observation    ALLERGIES:  has No Known Allergies.  MEDICATIONS: has a current medication list which includes the following prescription(s): accu-chek compact plus, aspirin, insulin glargine, pen needles, ketorolac, lisinopril, simvastatin, and vardenafil.   PHYSICAL EXAMINATION: ECOG PERFORMANCE STATUS: 1 - Symptomatic but completely ambulatory  Blood pressure 152/73, pulse 76, temperature 97.3 F (36.3 C), temperature source Oral, resp.  rate 19, height 5\' 8"  (1.727 m), weight 168 lb (76.204 kg), SpO2 100 %.  GENERAL:alert, no distress and comfortable; mildy anxious.  SKIN: skin color, texture, turgor are normal, no rashes or significant lesions EYES: normal, Conjunctiva are pink and non-injected, sclera clear OROPHARYNX:no exudate, no erythema and lips, buccal mucosa, and tongue normal  NECK: supple, thyroid normal size, non-tender, without nodularity LYMPH: palpable lymphadenopathy in the cervical 2 cm in the left cervical area. No palpable axillary or supraclavicular lymph nodes LUNGS: clear to auscultation and percussion with normal breathing effort HEART: regular rate & rhythm and no murmurs and no lower extremity edema ABDOMEN:abdomen soft, non-tender and normal bowel sounds ; palpable spleen abut 5 cm below the costal margin.  Musculoskeletal:no cyanosis of digits and no clubbing  NEURO: alert & oriented x 3 with fluent speech, no focal motor/sensory deficits  Labs:  Lab Results  Component Value Date   WBC 26.6* 05/01/2014   HGB 13.4 05/01/2014   HCT 40.8 05/01/2014   MCV 91.0 05/01/2014   PLT 279 05/01/2014   NEUTROABS 6.2 05/01/2014      Chemistry      Component Value Date/Time   NA 137 04/26/2014 1134   NA 136 12/29/2013 0934   K 4.0 04/26/2014 1134   K 5.4* 12/29/2013 0934   CL 103 04/26/2014 1134   CL 102 04/25/2012 1003   CO2 30 04/26/2014 1134   CO2 24 12/29/2013 0934   BUN 15 04/26/2014 1134   BUN 20.6 12/29/2013 0934   CREATININE 0.94 04/26/2014 1134   CREATININE 1.1 12/29/2013 0934      Component Value Date/Time   CALCIUM 9.5 04/26/2014 1134   CALCIUM 9.8 12/29/2013 0934   ALKPHOS 87 12/29/2013 0934   ALKPHOS 66 07/26/2013 1113  AST 16 12/29/2013 0934   AST 17 07/26/2013 1113   ALT 16 12/29/2013 0934   ALT 13 07/26/2013 1113   BILITOT 0.97 12/29/2013 0934   BILITOT 1.0 07/26/2013 1113      ASSESSMENT: 59 year old gentleman with the following issues   1. Small lymphocytic  lymphoma, stage III. w diffuse adenopathy and splenomegaly.  His white cell count slightly elevated and seems to be progressively increasing. He did have cervical lymphadenopathy which is unclear to me if it's changed dramatically. He start to get bothered by his neck adenopathy and running is reasonable to restage him. I will obtain a full body CT scan and repeat laboratory testing in 3 months. If he has bulky adenopathy, we have discussed starting systemic chemotherapy for his symptomatic lymphadenopathy. I think a bendamustine based regimen would be reasonable.  2. DM2 on insulin. Management per his PCP.  3. Hypercholesteremia. Continue simvastatin.  Management per his PCP.      [facility], MD 05/01/2014 9:54 AM

## 2014-05-11 ENCOUNTER — Ambulatory Visit (INDEPENDENT_AMBULATORY_CARE_PROVIDER_SITE_OTHER): Payer: 59 | Admitting: Ophthalmology

## 2014-05-11 ENCOUNTER — Encounter: Payer: Self-pay | Admitting: Nurse Practitioner

## 2014-05-11 ENCOUNTER — Ambulatory Visit (INDEPENDENT_AMBULATORY_CARE_PROVIDER_SITE_OTHER): Payer: 59 | Admitting: Nurse Practitioner

## 2014-05-11 VITALS — BP 108/62 | HR 80 | Ht 68.5 in | Wt 166.0 lb

## 2014-05-11 DIAGNOSIS — Z800 Family history of malignant neoplasm of digestive organs: Secondary | ICD-10-CM | POA: Diagnosis not present

## 2014-05-11 MED ORDER — POLYETHYLENE GLYCOL 3350 17 GM/SCOOP PO POWD
1.0000 | Freq: Every day | ORAL | Status: DC
Start: 2014-05-11 — End: 2014-05-18

## 2014-05-11 NOTE — Patient Instructions (Addendum)
You have been scheduled for a colonoscopy. Please follow written instructions given to you at your visit today.  Please pick up your prep supplies at the pharmacy within the next 1-3 days. [facility] [address]  If you use inhalers (even only as needed), please bring them with you on the day of your procedure. Your physician has requested that you go to www.startemmi.com and enter the access code given to you at your visit today. This web site gives a general overview about your procedure. However, you should still follow specific instructions given to you by our office regarding your preparation for the procedure. 

## 2014-05-11 NOTE — Progress Notes (Signed)
    HPI :  Patient is a 59 year old male known to Dr. Carlean Purl. He had a screening colonoscopy December 2008. Patient's brother recently died of colon cancer, he was in his fifties. Given this change in patient's family medical history patient is referred by PCP for colonoscopy. Patient has no general or gastrointestinal complaints. Specifically, he has no bowel changes, blood in stool, or unexplained weight loss.   Past Medical History  Diagnosis Date  . DIABETES MELLITUS, TYPE I, UNCONTROLLED 12/30/2006  . HYPERLIPIDEMIA 12/30/2006  . HYPERTENSION 12/30/2006  . ERECTILE DYSFUNCTION, ORGANIC 12/30/2006  . Diabetic retinopathy   . CLL (chronic lymphocytic leukemia)     stage III: asymptomatic; on observation    Family History  Problem Relation Age of Onset  . Alcohol abuse Other   . Colon cancer Brother 10    Died 10/10/2013  . Diabetes Mellitus II Mother    History  Substance Use Topics  . Smoking status: Never Smoker   . Smokeless tobacco: Not on file  . Alcohol Use: 2.4 oz/week    4 Glasses of wine per week   Current Outpatient Prescriptions  Medication Sig Dispense Refill  . ACCU-CHEK COMPACT PLUS test strip USE AS INSTRUCTED 100 each 3  . aspirin 81 MG tablet Take 81 mg by mouth daily.    . insulin glargine (LANTUS) 100 UNIT/ML injection INJECT 20 units each bedtime 20 mL 3  . Insulin Pen Needle (PEN NEEDLES) 31G X 6 MM MISC 1 Syringe by Does not apply route 1 dose over 46 hours. Once daily for glucose control 100 each 3  . ketorolac (ACULAR) 0.4 % SOLN Apply 1 drop to eye daily. 1 drop in right eye.    Marland Kitchen lisinopril (PRINIVIL,ZESTRIL) 40 MG tablet TAKE 1 TABLET BY MOUTH EVERY DAY 90 tablet 3  . simvastatin (ZOCOR) 20 MG tablet TAKE 1 TABLET (20 MG TOTAL) BY MOUTH AT BEDTIME. 100 tablet 3  . vardenafil (LEVITRA) 20 MG tablet TAKE 1 TABLET BY MOUTH EVERY DAY AS NEEDED *TIME FOR AN OFFICE VISIT* 10 tablet 11   No current facility-administered medications for this visit.   No  Known Allergies   Review of Systems: All systems reviewed and negative except where noted in HPI.    Physical Exam: BP 108/62 mmHg  Pulse 80  Ht 5' 8.5" (1.74 m)  Wt 166 lb (75.297 kg)  BMI 24.87 kg/m2 Constitutional: Pleasant,well-developed, black male in no acute distress. HEENT: Normocephalic and atraumatic. Conjunctivae are normal. No scleral icterus. Neck supple.  Cardiovascular: Normal rate, regular rhythm.  Pulmonary/chest: Effort normal and breath sounds normal. No wheezing, rales or rhonchi. Abdominal: Soft, nondistended, nontender. Bowel sounds active throughout. There are no masses palpable. No hepatomegaly. Extremities: no edema Lymphadenopathy: No cervical adenopathy noted. Neurological: Alert and oriented to person place and time. Skin: Skin is warm and dry. No rashes noted. Psychiatric: Normal mood and affect. Behavior is normal.   ASSESSMENT AND PLAN:   33. 59 year old male with new family history of colon cancer in brother who recently passed away in his 70's. Patient originally due for repeat colon cancer screening in 2018 but given this interval change in family history patient will need  screening colonoscopies done sooner. The risks, benefits, and alternatives to colonoscopy with possible biopsy and possible polypectomy were discussed with the patient and he consents to proceed.   2. Chronic lymphocytic leukemia / diabetes  CC: Macon Large, MD

## 2014-05-14 NOTE — Progress Notes (Signed)
Agree with Ms. Guenther's assessment and plan. Carl E. Gessner, MD, FACG   

## 2014-05-18 ENCOUNTER — Encounter: Payer: Self-pay | Admitting: Internal Medicine

## 2014-05-18 ENCOUNTER — Other Ambulatory Visit: Payer: Self-pay | Admitting: Family Medicine

## 2014-05-18 ENCOUNTER — Ambulatory Visit (AMBULATORY_SURGERY_CENTER): Payer: 59 | Admitting: Internal Medicine

## 2014-05-18 VITALS — BP 129/75 | HR 67 | Temp 96.4°F | Resp 14 | Ht 68.5 in | Wt 166.0 lb

## 2014-05-18 DIAGNOSIS — Z800 Family history of malignant neoplasm of digestive organs: Secondary | ICD-10-CM | POA: Diagnosis not present

## 2014-05-18 DIAGNOSIS — D123 Benign neoplasm of transverse colon: Secondary | ICD-10-CM | POA: Diagnosis not present

## 2014-05-18 DIAGNOSIS — Z1211 Encounter for screening for malignant neoplasm of colon: Secondary | ICD-10-CM

## 2014-05-18 LAB — GLUCOSE, CAPILLARY
GLUCOSE-CAPILLARY: 125 mg/dL — AB (ref 70–99)
Glucose-Capillary: 161 mg/dL — ABNORMAL HIGH (ref 70–99)

## 2014-05-18 MED ORDER — SODIUM CHLORIDE 0.9 % IV SOLN
500.0000 mL | INTRAVENOUS | Status: DC
Start: 2014-05-18 — End: 2014-05-18

## 2014-05-18 NOTE — Progress Notes (Signed)
To recovery, report given to Scott, RN, VSS 

## 2014-05-18 NOTE — Progress Notes (Signed)
Called to room to assist during endoscopic procedure.  Patient ID and intended procedure confirmed with present staff. Received instructions for my participation in the procedure from the performing physician.  

## 2014-05-18 NOTE — Patient Instructions (Addendum)
I found and removed one small polyp that looks benign. You also have a condition called diverticulosis - common and not usually a problem. Please read the handout provided.  I will let you know pathology results and when to have another routine colonoscopy by mail. Probably 5 years 2021.  I appreciate the opportunity to care for you. Gatha Mayer, MD, FACG  YOU HAD AN ENDOSCOPIC PROCEDURE TODAY AT [facility]:   Refer to the procedure report that was given to you for any specific questions about what was found during the examination.  If the procedure report does not answer your questions, please call your gastroenterologist to clarify.  If you requested that your care partner not be given the details of your procedure findings, then the procedure report has been included in a sealed envelope for you to review at your convenience later.  YOU SHOULD EXPECT: Some feelings of bloating in the abdomen. Passage of more gas than usual.  Walking can help get rid of the air that was put into your GI tract during the procedure and reduce the bloating. If you had a lower endoscopy (such as a colonoscopy or flexible sigmoidoscopy) you may notice spotting of blood in your stool or on the toilet paper. If you underwent a bowel prep for your procedure, you may not have a normal bowel movement for a few days.  Please Note:  You might notice some irritation and congestion in your nose or some drainage.  This is from the oxygen used during your procedure.  There is no need for concern and it should clear up in a day or so.  SYMPTOMS TO REPORT IMMEDIATELY:   Following lower endoscopy (colonoscopy or flexible sigmoidoscopy):  Excessive amounts of blood in the stool  Significant tenderness or worsening of abdominal pains  Swelling of the abdomen that is new, acute  Fever of 100F or higher    For urgent or emergent issues, a gastroenterologist can be reached at any hour by calling  [phone].   DIET: Your first meal following the procedure should be a small meal and then it is ok to progress to your normal diet. Heavy or fried foods are harder to digest and may make you feel nauseous or bloated.  Likewise, meals heavy in dairy and vegetables can increase bloating.  Drink plenty of fluids but you should avoid alcoholic beverages for 24 hours.  ACTIVITY:  You should plan to take it easy for the rest of today and you should NOT DRIVE or use heavy machinery until tomorrow (because of the sedation medicines used during the test).    FOLLOW UP: Our staff will call the number listed on your records the next business day following your procedure to check on you and address any questions or concerns that you may have regarding the information given to you following your procedure. If we do not reach you, we will leave a message.  However, if you are feeling well and you are not experiencing any problems, there is no need to return our call.  We will assume that you have returned to your regular daily activities without incident.  Polyp and diverticulosis information given.  If any biopsies were taken you will be contacted by phone or by letter within the next 1-3 weeks.  Please call us at [phone] if you have not heard about the biopsies in 3 weeks.    SIGNATURES/CONFIDENTIALITY: You and/or your care partner have signed paperwork  which will be entered into your electronic medical record.  These signatures attest to the fact that that the information above on your After Visit Summary has been reviewed and is understood.  Full responsibility of the confidentiality of this discharge information lies with you and/or your care-partner.

## 2014-05-18 NOTE — Op Note (Signed)
[place]  [address] [place], [address]   COLONOSCOPY PROCEDURE REPORT  PATIENT: Tyler Scott, Tyler Scott  MR#: [id number] BIRTHDATE: 08-27-1955 , 54  yrs. old GENDER: male ENDOSCOPIST: Gatha Mayer, MD, [facility] PROCEDURE DATE:  05/18/2014 PROCEDURE:   Colonoscopy, screening and Colonoscopy with snare polypectomy First Screening Colonoscopy - Avg.  risk and is 50 yrs.  old or older - No.  Prior Negative Screening - Now for repeat screening. Less than 10 yrs Prior Negative Screening - Now for repeat screening.  Above average risk  History of Adenoma - Now for follow-up colonoscopy & has been > or = to 3 yrs.  N/A ASA CLASS:   Class II INDICATIONS:Screening for colonic neoplasia and FH Colon or Rectal Adenocarcinoma. MEDICATIONS: Propofol 240 mg IV and Monitored anesthesia care  DESCRIPTION OF PROCEDURE:   After the risks benefits and alternatives of the procedure were thoroughly explained, informed consent was obtained.  The digital rectal exam revealed no abnormalities of the rectum.   The LB [id number]  endoscope was introduced through the anus and advanced to the cecum, which was identified by both the appendix and ileocecal valve. No adverse events experienced.   The quality of the prep was adequate (MiraLax was used)  The instrument was then slowly withdrawn as the colon was fully examined.      COLON FINDINGS: A sessile polyp measuring 6 mm in size was found in the transverse colon.  A polypectomy was performed with a cold snare.  The resection was complete, the polyp tissue was completely retrieved and sent to histology.   There was mild diverticulosis noted throughout the entire examined colon.   The examination was otherwise normal.  Retroflexed views revealed no abnormalities. The time to cecum = 4.6 Withdrawal time = 10.3   The scope was withdrawn and the procedure completed. COMPLICATIONS: There were no immediate  complications.  ENDOSCOPIC IMPRESSION: 1.   Sessile polyp (82mm) was found in the transverse colon; polypectomy was performed with a cold snare 2.   There was mild diverticulosis noted throughout the entire examined colon 3.   The examination was otherwise normal - adequate prep - has FHx CRCA brother at 67  RECOMMENDATIONS: Timing of repeat colonoscopy will be determined by pathology findings. Likely 5 years  eSigned:  Gatha Mayer, MD, [facility] 05/18/2014 11:10 AM   cc: The Patient

## 2014-05-18 NOTE — Progress Notes (Deleted)
Called to room to assist during endoscopic procedure.  Patient ID and intended procedure confirmed with present staff. Received instructions for my participation in the procedure from the performing physician.  

## 2014-05-21 ENCOUNTER — Telehealth: Payer: Self-pay | Admitting: *Deleted

## 2014-05-21 NOTE — Telephone Encounter (Signed)
  Follow up Call-  Call back number 05/18/2014  Post procedure Call Back phone  # [phone] cell  Permission to leave phone message Yes     Patient questions:  Do you have a fever, pain , or abdominal swelling? No. Pain Score  0 *  Have you tolerated food without any problems? Yes.    Have you been able to return to your normal activities? Yes.    Do you have any questions about your discharge instructions: Diet   No. Medications  No. Follow up visit  No.  Do you have questions or concerns about your Care? No.  Actions: * If pain score is 4 or above: No action needed, pain <4.

## 2014-05-24 ENCOUNTER — Encounter: Payer: Self-pay | Admitting: Internal Medicine

## 2014-05-24 DIAGNOSIS — Z860101 Personal history of adenomatous and serrated colon polyps: Secondary | ICD-10-CM | POA: Insufficient documentation

## 2014-05-24 DIAGNOSIS — Z86010 Personal history of colonic polyps: Secondary | ICD-10-CM

## 2014-05-24 HISTORY — DX: Personal history of colonic polyps: Z86.010

## 2014-05-24 HISTORY — DX: Personal history of adenomatous and serrated colon polyps: Z86.0101

## 2014-05-24 NOTE — Progress Notes (Signed)
Quick Note:  6 mm adenoma - repeat colon 5 yrs 2021 ______

## 2014-06-08 ENCOUNTER — Ambulatory Visit (INDEPENDENT_AMBULATORY_CARE_PROVIDER_SITE_OTHER): Payer: 59 | Admitting: Ophthalmology

## 2014-06-08 DIAGNOSIS — H35033 Hypertensive retinopathy, bilateral: Secondary | ICD-10-CM

## 2014-06-08 DIAGNOSIS — E11359 Type 2 diabetes mellitus with proliferative diabetic retinopathy without macular edema: Secondary | ICD-10-CM

## 2014-06-08 DIAGNOSIS — E11319 Type 2 diabetes mellitus with unspecified diabetic retinopathy without macular edema: Secondary | ICD-10-CM | POA: Diagnosis not present

## 2014-06-08 DIAGNOSIS — H43813 Vitreous degeneration, bilateral: Secondary | ICD-10-CM | POA: Diagnosis not present

## 2014-06-08 DIAGNOSIS — H4311 Vitreous hemorrhage, right eye: Secondary | ICD-10-CM

## 2014-06-08 DIAGNOSIS — I10 Essential (primary) hypertension: Secondary | ICD-10-CM

## 2014-06-08 DIAGNOSIS — H2513 Age-related nuclear cataract, bilateral: Secondary | ICD-10-CM | POA: Diagnosis not present

## 2014-07-21 ENCOUNTER — Other Ambulatory Visit: Payer: Self-pay | Admitting: Family Medicine

## 2014-07-23 ENCOUNTER — Other Ambulatory Visit: Payer: Self-pay | Admitting: Family Medicine

## 2014-07-23 ENCOUNTER — Other Ambulatory Visit: Payer: Self-pay | Admitting: *Deleted

## 2014-07-23 MED ORDER — MYGLUCOHEALTH LANCETS 30G MISC: Status: DC

## 2014-07-23 MED ORDER — GLUCOSE BLOOD VI STRP: ORAL_STRIP | Status: DC

## 2014-07-30 ENCOUNTER — Other Ambulatory Visit: Payer: Self-pay | Admitting: Family Medicine

## 2014-07-31 ENCOUNTER — Ambulatory Visit (HOSPITAL_COMMUNITY)
Admission: RE | Admit: 2014-07-31 | Discharge: 2014-07-31 | Disposition: A | Discharge status: Home / Self Care | Payer: 59 | Source: Ambulatory Visit | Attending: Oncology | Admitting: Oncology

## 2014-07-31 ENCOUNTER — Encounter (HOSPITAL_COMMUNITY): Payer: Self-pay

## 2014-07-31 ENCOUNTER — Other Ambulatory Visit (HOSPITAL_BASED_OUTPATIENT_CLINIC_OR_DEPARTMENT_OTHER): Payer: 59

## 2014-07-31 DIAGNOSIS — C9110 Chronic lymphocytic leukemia of B-cell type not having achieved remission: Secondary | ICD-10-CM

## 2014-07-31 DIAGNOSIS — C8308 Small cell B-cell lymphoma, lymph nodes of multiple sites: Secondary | ICD-10-CM | POA: Diagnosis not present

## 2014-07-31 LAB — COMPREHENSIVE METABOLIC PANEL (CC13)
ALBUMIN: 4.2 g/dL (ref 3.5–5.0)
ALK PHOS: 70 U/L (ref 40–150)
ALT: 18 U/L (ref 0–55)
AST: 20 U/L (ref 5–34)
Anion Gap: 6 mEq/L (ref 3–11)
BILIRUBIN TOTAL: 0.63 mg/dL (ref 0.20–1.20)
BUN: 17.3 mg/dL (ref 7.0–26.0)
CHLORIDE: 105 meq/L (ref 98–109)
CO2: 28 mEq/L (ref 22–29)
CREATININE: 1 mg/dL (ref 0.7–1.3)
Calcium: 9 mg/dL (ref 8.4–10.4)
EGFR: >90 mL/min/{1.73_m2} (ref >90)
Glucose: 111 mg/dl (ref 70–140)
Potassium: 4.6 mEq/L (ref 3.5–5.1)
SODIUM: 140 meq/L (ref 136–145)
Total Protein: 6.7 g/dL (ref 6.4–8.3)

## 2014-07-31 LAB — CBC WITH DIFFERENTIAL/PLATELET
BASO%: 0.2 % (ref 0.0–2.0)
Basophils Absolute: 0.1 10*3/uL (ref 0.0–0.1)
EOS ABS: 0.4 10*3/uL (ref 0.0–0.5)
EOS%: 1.6 % (ref 0.0–7.0)
HEMATOCRIT: 37.8 % — AB (ref 38.4–49.9)
HGB: 12.4 g/dL — ABNORMAL LOW (ref 13.0–17.1)
LYMPH%: 77.4 % — AB (ref 14.0–49.0)
MCH: 30.3 pg (ref 27.2–33.4)
MCHC: 32.9 g/dL (ref 32.0–36.0)
MCV: 92 fL (ref 79.3–98.0)
MONO#: 0.8 10*3/uL (ref 0.1–0.9)
MONO%: 3 % (ref 0.0–14.0)
NEUT#: 4.9 10*3/uL (ref 1.5–6.5)
NEUT%: 17.8 % — ABNORMAL LOW (ref 39.0–75.0)
PLATELETS: 282 10*3/uL (ref 140–400)
RBC: 4.11 10*6/uL — ABNORMAL LOW (ref 4.20–5.82)
RDW: 13.7 % (ref 11.0–14.6)
WBC: 27.4 10*3/uL — AB (ref 4.0–10.3)
lymph#: 21.2 10*3/uL — ABNORMAL HIGH (ref 0.9–3.3)

## 2014-07-31 LAB — TECHNOLOGIST REVIEW

## 2014-07-31 MED ORDER — IOHEXOL 300 MG/ML  SOLN
100.0000 mL | Freq: Once | INTRAMUSCULAR | Status: AC | PRN
  Administered 2014-07-31: 100 mL via INTRAVENOUS

## 2014-08-02 ENCOUNTER — Telehealth: Payer: Self-pay | Admitting: Oncology

## 2014-08-02 ENCOUNTER — Ambulatory Visit (HOSPITAL_BASED_OUTPATIENT_CLINIC_OR_DEPARTMENT_OTHER): Payer: 59 | Admitting: Oncology

## 2014-08-02 VITALS — BP 113/69 | HR 82 | Temp 98.2°F | Resp 18 | Ht 68.5 in | Wt 160.2 lb

## 2014-08-02 DIAGNOSIS — C8300 Small cell B-cell lymphoma, unspecified site: Secondary | ICD-10-CM | POA: Diagnosis not present

## 2014-08-02 DIAGNOSIS — E119 Type 2 diabetes mellitus without complications: Secondary | ICD-10-CM

## 2014-08-02 DIAGNOSIS — E780 Pure hypercholesterolemia: Secondary | ICD-10-CM

## 2014-08-02 DIAGNOSIS — C9110 Chronic lymphocytic leukemia of B-cell type not having achieved remission: Secondary | ICD-10-CM

## 2014-08-02 NOTE — Telephone Encounter (Signed)
s.w. pt and advised on OCT appt....pt ok and aware °

## 2014-08-02 NOTE — Progress Notes (Signed)
[facility] OFFICE PROGRESS NOTE  [name], MD [place] [address]  DIAGNOSIS: 59 year old gentleman diagnosed with small lymphocytic lymphoma in 2012. He presented with leukocytosis and diffuse lymphadenopathy. He has stage III disease upon presentation.  CURRENT THERAPY: Watchful observation.   INTERVAL HISTORY: Tyler Scott 59 y.o. male presents today for a follow-up visit. Since his last visit, he reports no changes in his health.  He reports continued cervical adenopathy right more than left which has not changed. He denies any constitutional symptoms such as weight lost, fevers or night sweats.  He denies inguinal, axillary palpable adenopathy.   He denies early satiety and his weight have been relatively stable.  He does not report any fevers, chills, sweats or any constitutional symptoms. He continues to work full-time without any decline. He does not report any chest pain or palpitation. He does not report any nausea, vomiting, abdominal pain. Does not report any genitourinary complaints. Does not report any skeletal complaints. Rest of review of systems unremarkable.  MEDICAL HISTORY: Past Medical History  Diagnosis Date  . HYPERLIPIDEMIA 12/30/2006  . HYPERTENSION 12/30/2006  . ERECTILE DYSFUNCTION, ORGANIC 12/30/2006  . LOW BACK PAIN, ACUTE 12/21/2006  . Diabetic retinopathy   . Allergy   . Arthritis   . Hx of adenomatous polyp of colon 05/24/2014  . CLL (chronic lymphocytic leukemia)     stage III: asymptomatic; on observation  . DIABETES MELLITUS, TYPE I, UNCONTROLLED 12/30/2006    insulin    ALLERGIES:  has No Known Allergies.  MEDICATIONS: has a current medication list which includes the following prescription(s): aspirin, b-d uf iii mini pen needles, one touch ultra 2, glucose blood, pen needles, ketorolac, lantus solostar, levitra, lisinopril, myglucohealth lancets 30g, and simvastatin.   PHYSICAL  EXAMINATION: ECOG PERFORMANCE STATUS: 0  Blood pressure 113/69, pulse 82, temperature 98.2 F (36.8 C), temperature source Oral, resp. rate 18, height 5' 8.5" (1.74 m), weight 160 lb 3.2 oz (72.666 kg).  GENERAL:alert, no distress and comfortable; not in any distress. SKIN: skin color, texture, turgor are normal. EYES: normal, Conjunctiva are pink and non-injected, sclera clear OROPHARYNX:no exudate.  NECK: supple, thyroid normal size, non-tender, without nodularity LYMPH: palpable lymphadenopathy in the cervical 2 cm in the left cervical area. No other lymphadenopathy. LUNGS: clear to auscultation and percussion with normal breathing effort HEART: regular rate & rhythm and no murmurs and no lower extremity edema ABDOMEN:abdomen soft, non-tender and normal bowel sounds ; palpable spleen without any changes. Musculoskeletal:no cyanosis of digits and no clubbing  NEURO: alert & oriented x 3 with fluent speech, no focal motor/sensory deficits  Labs:  Lab Results  Component Value Date   WBC 27.4* 07/31/2014   HGB 12.4* 07/31/2014   HCT 37.8* 07/31/2014   MCV 92.0 07/31/2014   PLT 282 07/31/2014   NEUTROABS 4.9 07/31/2014      Chemistry      Component Value Date/Time   NA 140 07/31/2014 0841   NA 137 04/26/2014 1134   K 4.6 07/31/2014 0841   K 4.0 04/26/2014 1134   CL 103 04/26/2014 1134   CL 102 04/25/2012 1003   CO2 28 07/31/2014 0841   CO2 30 04/26/2014 1134   BUN 17.3 07/31/2014 0841   BUN 15 04/26/2014 1134   CREATININE 1.0 07/31/2014 0841   CREATININE 0.94 04/26/2014 1134      Component Value Date/Time   CALCIUM 9.0 07/31/2014 0841   CALCIUM 9.5 04/26/2014 1134   ALKPHOS  70 07/31/2014 0841   ALKPHOS 66 07/26/2013 1113   AST 20 07/31/2014 0841   AST 17 07/26/2013 1113   ALT 18 07/31/2014 0841   ALT 13 07/26/2013 1113   BILITOT 0.63 07/31/2014 0841   BILITOT 1.0 07/26/2013 1113      EXAM: CT OF THE NECK WITH CONTRAST  CT OF THE CHEST WITH CONTRAST  CT  OF THE ABDOMEN AND PELVIS WITH CONTRAST  TECHNIQUE: Multidetector CT imaging of the neck was performed with intravenous contrast.; Multidetector CT imaging of the abdomen and pelvis was performed following the standard protocol during bolus administration of intravenous contrast.; Multidetector CT imaging of the chest was performed following the standard protocol during bolus administration of intravenous contrast.  CONTRAST: 123mL OMNIPAQUE IOHEXOL 300 MG/ML SOLN  COMPARISON: CT of the neck, chest, abdomen and pelvis 08/26/2010.  FINDINGS: CT NECK FINDINGS  Extensive lymphadenopathy throughout all cervical nodal stations bilaterally, with the largest lymph nodes measuring up to 19 mm in short axis in the left level IIa nodal station. Visualized intracranial contents are unremarkable. There are no aggressive appearing lytic or blastic lesions noted in the visualized portions of the skeleton.  CT CHEST FINDINGS  Mediastinum/Lymph Nodes: Extensive superior mediastinal lymphadenopathy, with the largest lymph node measuring up to 18 mm in short axis located between the left common carotid and left subclavian arteries. Supraclavicular lymphadenopathy bilaterally, largest of which is on the left measuring 19 mm in short axis. Other mediastinal adenopathy includes 14 mm right peritracheal lymph node and 10 mm subcarinal lymph node. Extensive bilateral axillary lymphadenopathy measuring up to 15 mm in short axis on the right and 22 mm in short axis on the left. Heart size is normal. There is no significant pericardial fluid, thickening or pericardial calcification. Esophagus is unremarkable in appearance.  Lungs/Pleura: 3 mm subpleural nodule in the anterior aspect of the left lower lobe (image 42 of series 4), unchanged compared to the prior examination from 08/26/2010, considered benign, presumably a subpleural lymph node. No other suspicious appearing pulmonary nodules  or masses are otherwise noted. No acute consolidative airspace disease. No pleural effusions.  Musculoskeletal/Soft Tissues: There are no aggressive appearing lytic or blastic lesions noted in the visualized portions of the skeleton.  CT ABDOMEN AND PELVIS FINDINGS  Hepatobiliary: No cystic or solid hepatic lesions. No intra or extrahepatic biliary ductal dilatation. Gallbladder is normal in appearance.  Pancreas: No pancreatic mass. No pancreatic ductal dilatation. No pancreatic or peripancreatic fluid or inflammatory changes.  Spleen: Spleen is normal in size and appearance measuring up to 9.7 cm in length.  Adrenals/Urinary Tract: Multiple low-attenuation renal lesions bilaterally, several of which are too small to definitively characterize, but are favored to represent tiny cysts. The largest of these are all compatible with simple cysts, largest of which measures 18 mm in the anterior aspect of the lower pole of the left kidney. Bilateral adrenal glands are normal in appearance. No hydroureteronephrosis. Urinary bladder is normal in appearance.  Stomach/Bowel: Normal appearance of the stomach. No pathologic dilatation of small bowel or colon. Normal appendix.  Vascular/Lymphatic: Extensive lymphadenopathy throughout the abdomen and pelvis. Specific examples include a 20 mm short axis left para-aortic lymph node (image 76 of series 2), a 19 mm short axis left external iliac lymph node (image 107 of series 2), and a 16 mm short axis right external iliac lymph node (image 101 of series 2). Multiple additional lymph nodes are noted throughout all nodal distributions in the abdomen and pelvis, and  the visualized upper thighs bilaterally. Mild atherosclerosis throughout the abdominal and pelvic vasculature, without evidence of aneurysm or dissection.  Reproductive: Prostate gland and seminal vesicles are unremarkable in appearance.  Other: No significant volume of  ascites. No pneumoperitoneum.  Musculoskeletal: There are no aggressive appearing lytic or blastic lesions noted in the visualized portions of the skeleton.  IMPRESSION: 1. Widespread lymphadenopathy throughout the neck, chest, abdomen and pelvis, as detailed above, compatible with recurrent disease in this patient with history of CLL. 2. Mild atherosclerosis. 3. Additional incidental findings, as above.     ASSESSMENT: 59 year old gentleman with the following issues   1. Small lymphocytic lymphoma, stage III. w diffuse adenopathy and splenomegaly diagnosed in 2012. He continues to be on active surveillance at this time.  Laboratory testing and CT scan from 07/31/2014 were reviewed today and continue to show mild elevation in his white cell count and lymphadenopathy. His lymphadenopathy is not bulky or symptomatic at this time. He does not have any constitutional symptoms or any indication for treatment. Risks and benefits of starting chemotherapy now versus at a later date were discussed. After discussion today, we have deferred treatment unless he becomes symptomatic or he isn't lymphadenopathy becomes bulky. I continue to educate him about signs of symptoms of CLL progression. Immune dysregulation, cytopenias among others also emphasized.   Treatment options were outlined today including chemotherapy, chemoimmunotherapy as well as oral targeted therapy. I reviewed with him the recent literature with the possibility of starting targeted therapy up front to replace chemoimmunotherapy as a first-line modality.  He is interested to pursue a less toxic therapy only when absolutely necessary at this time.  The plan is to continue with active surveillance every 4 months sooner if needed to.  2. DM2 on insulin: Management per his PCP.  3. Hypercholesteremia: Continue simvastatin.  Management per his PCP.      [facility], MD 08/02/2014 10:36 AM

## 2014-09-25 ENCOUNTER — Telehealth: Payer: Self-pay | Admitting: Family Medicine

## 2014-09-25 NOTE — Telephone Encounter (Signed)
Thanks.  Please schedule patient per Dr Ansel Bong instructions

## 2014-09-25 NOTE — Telephone Encounter (Signed)
Pt says that he would like to talk with you about scheduling an appointment. I tried to assist him but he was upset and demanding either Apolonio Schneiders or her 'assistant' JoAnne give him a call.

## 2014-09-25 NOTE — Telephone Encounter (Signed)
Patient would like to know if Dr Yong Channel will accept him as a new patient?

## 2014-09-25 NOTE — Telephone Encounter (Signed)
I will but he will need to use one of the Monday slots for patients over 50 and continue care with Dr. Sherren Mocha until that time.

## 2014-09-25 NOTE — Telephone Encounter (Signed)
Pt does not want to transfer to Dr Yong Channel at this time. He did not realize he could see someone else in Dr Sherren Mocha  absents

## 2014-09-26 ENCOUNTER — Ambulatory Visit (INDEPENDENT_AMBULATORY_CARE_PROVIDER_SITE_OTHER): Payer: Commercial Managed Care - HMO | Admitting: Adult Health

## 2014-09-26 ENCOUNTER — Encounter: Payer: Self-pay | Admitting: Adult Health

## 2014-09-26 VITALS — BP 100/68 | Temp 98.2°F | Ht 68.5 in | Wt 160.0 lb

## 2014-09-26 DIAGNOSIS — M25561 Pain in right knee: Secondary | ICD-10-CM

## 2014-09-26 MED ORDER — MELOXICAM 7.5 MG PO TABS: 7.5000 mg | ORAL_TABLET | Freq: Every day | ORAL | Status: DC

## 2014-09-26 NOTE — Progress Notes (Signed)
Subjective:    Patient ID: Tyler Scott, male    DOB: 1955-03-29, 59 y.o.   MRN: [id number]  HPI  59 year old male who presents to the office today for right knee pain. He went to urgent care last year and had a x ray done for right knee pain at which time it was discovered that he had a "bone spur" on his right knee. He has been taking 400 mg Ibuprofen. This has helped in the past, but the pain has been gradually been getting worse. He endorses that his knee swells on occasion. Denies any warmness or redness to right knee. The pain does on occasion prevent him from exercise. The pain is reproduced with bending knee to chest.   He would like to see ortho to " find out how close I am to having to have surgery to remove it."  Review of Systems  Constitutional: Negative.   Respiratory: Negative.   Cardiovascular: Negative.   Genitourinary: Negative.   Musculoskeletal: Positive for arthralgias. Negative for myalgias and gait problem.  All other systems reviewed and are negative.  Past Medical History  Diagnosis Date  . HYPERLIPIDEMIA 12/30/2006  . HYPERTENSION 12/30/2006  . ERECTILE DYSFUNCTION, ORGANIC 12/30/2006  . LOW BACK PAIN, ACUTE 12/21/2006  . Diabetic retinopathy   . Allergy   . Arthritis   . Hx of adenomatous polyp of colon 05/24/2014  . CLL (chronic lymphocytic leukemia)     stage III: asymptomatic; on observation  . DIABETES MELLITUS, TYPE I, UNCONTROLLED 12/30/2006    insulin    Social History   Social History  . Marital Status: Married    Spouse Name: N/A  . Number of Children: N/A  . Years of Education: N/A   Occupational History  . Not on file.   Social History Main Topics  . Smoking status: Never Smoker   . Smokeless tobacco: Not on file  . Alcohol Use: 2.4 oz/week    4 Glasses of wine per week  . Drug Use: No  . Sexual Activity: Not on file     Comment: regular exercise - yes   Other Topics Concern  . Not on file   Social History  Narrative    Past Surgical History  Procedure Laterality Date  . Great right toe- fused the i p joint    . Colonoscopy      Family History  Problem Relation Age of Onset  . Alcohol abuse Other   . Colon cancer Brother 44    Died 10/08/13  . Diabetes Mellitus II Mother   . Esophageal cancer Neg Hx   . Rectal cancer Neg Hx   . Stomach cancer Neg Hx     No Known Allergies  Current Outpatient Prescriptions on File Prior to Visit  Medication Sig Dispense Refill  . aspirin 81 MG tablet Take 81 mg by mouth daily.    . B-D UF III MINI PEN NEEDLES 31G X 5 MM MISC USE AS DIRECTED 100 each 3  . Blood Glucose Monitoring Suppl (ONE TOUCH ULTRA 2) W/DEVICE KIT See admin instructions.  0  . glucose blood (MYGLUCOHEALTH TEST) test strip Test glucose 3 times daily. DxE10.9 300 each 3  . Insulin Pen Needle (PEN NEEDLES) 31G X 6 MM MISC 1 Syringe by Does not apply route 1 dose over 46 hours. Once daily for glucose control 100 each 3  . ketorolac (ACULAR) 0.4 % SOLN Apply 1 drop to eye daily. 1 drop in right  eye.    Marland Kitchen LANTUS SOLOSTAR 100 UNIT/ML Solostar Pen INJECT 20 UNITS AT BEDTIME 15 pen 3  . LEVITRA 20 MG tablet TAKE 1 TABLET BY MOUTH EVERY DAY AS NEEDED *TIME FOR AN OFFICE VISIT* 6 tablet 4  . lisinopril (PRINIVIL,ZESTRIL) 40 MG tablet TAKE 1 TABLET BY MOUTH EVERY DAY 90 tablet 3  . MYGLUCOHEALTH LANCETS 30G MISC Test glucose 3 times daily.  Dx E10.9 300 each 3  . simvastatin (ZOCOR) 20 MG tablet TAKE 1 TABLET (20 MG TOTAL) BY MOUTH AT BEDTIME. 100 tablet 3   No current facility-administered medications on file prior to visit.    BP 100/68 mmHg  Temp(Src) 98.2 F (36.8 C) (Oral)  Ht 5' 8.5" (1.74 m)  Wt 160 lb (72.576 kg)  BMI 23.97 kg/m2        Objective:   Physical Exam  Constitutional: He is oriented to person, place, and time. He appears well-developed and well-nourished. No distress.  Musculoskeletal: Normal range of motion. He exhibits no edema or tenderness.  Could not  reproduce pain during exam.  No swelling noted.   Neurological: He is alert and oriented to person, place, and time.  Skin: Skin is warm and dry. No rash noted. He is not diaphoretic. No erythema. No pallor.  Psychiatric: He has a normal mood and affect. His behavior is normal. Judgment and thought content normal.  Nursing note and vitals reviewed.      Assessment & Plan:  1. Right knee pain  - meloxicam (MOBIC) 7.5 MG tablet; Take 1 tablet (7.5 mg total) by mouth daily.  Dispense: 30 tablet; Refill: 0. Take as needed - DG Knee 1-2 Views Right; Future - AMB referral to orthopedics - Follow up if no improvement.  - Consider joint injection or physical therapy.

## 2014-09-26 NOTE — Progress Notes (Signed)
Pre visit review using our clinic review tool, if applicable. No additional management support is needed unless otherwise documented below in the visit note. 

## 2014-09-26 NOTE — Patient Instructions (Signed)
It was great meeting you today.   -Take Mobic as directed for knee pain  - Use ice and elevation when you have the pain as well.   - Someone will follow up with you to schedule an appointment for Orthopedics.   -Follow up as needed.

## 2014-11-20 ENCOUNTER — Other Ambulatory Visit: Payer: Self-pay | Admitting: Adult Health

## 2014-11-22 ENCOUNTER — Other Ambulatory Visit (HOSPITAL_BASED_OUTPATIENT_CLINIC_OR_DEPARTMENT_OTHER): Payer: Commercial Managed Care - HMO

## 2014-11-22 DIAGNOSIS — E119 Type 2 diabetes mellitus without complications: Secondary | ICD-10-CM

## 2014-11-22 DIAGNOSIS — C9110 Chronic lymphocytic leukemia of B-cell type not having achieved remission: Secondary | ICD-10-CM

## 2014-11-22 DIAGNOSIS — C8300 Small cell B-cell lymphoma, unspecified site: Secondary | ICD-10-CM | POA: Diagnosis not present

## 2014-11-22 LAB — CBC WITH DIFFERENTIAL/PLATELET
BASO%: 0.2 % (ref 0.0–2.0)
Basophils Absolute: 0.1 10*3/uL (ref 0.0–0.1)
EOS%: 1.2 % (ref 0.0–7.0)
Eosinophils Absolute: 0.4 10*3/uL (ref 0.0–0.5)
HCT: 40.2 % (ref 38.4–49.9)
HGB: 13.4 g/dL (ref 13.0–17.1)
LYMPH%: 82.9 % — AB (ref 14.0–49.0)
MCH: 30.7 pg (ref 27.2–33.4)
MCHC: 33.4 g/dL (ref 32.0–36.0)
MCV: 92.1 fL (ref 79.3–98.0)
MONO#: 0.7 10*3/uL (ref 0.1–0.9)
MONO%: 2.4 % (ref 0.0–14.0)
NEUT#: 4.1 10*3/uL (ref 1.5–6.5)
NEUT%: 13.3 % — ABNORMAL LOW (ref 39.0–75.0)
Platelets: 267 10*3/uL (ref 140–400)
RBC: 4.36 10*6/uL (ref 4.20–5.82)
RDW: 14 % (ref 11.0–14.6)
WBC: 30.5 10*3/uL — ABNORMAL HIGH (ref 4.0–10.3)
lymph#: 25.3 10*3/uL — ABNORMAL HIGH (ref 0.9–3.3)

## 2014-11-22 LAB — COMPREHENSIVE METABOLIC PANEL (CC13)
ALT: 16 U/L (ref 0–55)
AST: 22 U/L (ref 5–34)
Albumin: 4.2 g/dL (ref 3.5–5.0)
Alkaline Phosphatase: 75 U/L (ref 40–150)
Anion Gap: 7 mEq/L (ref 3–11)
BILIRUBIN TOTAL: 0.9 mg/dL (ref 0.20–1.20)
BUN: 13 mg/dL (ref 7.0–26.0)
CHLORIDE: 110 meq/L — AB (ref 98–109)
CO2: 26 meq/L (ref 22–29)
Calcium: 9 mg/dL (ref 8.4–10.4)
Creatinine: 0.9 mg/dL (ref 0.7–1.3)
Glucose: 123 mg/dl (ref 70–140)
POTASSIUM: 4.1 meq/L (ref 3.5–5.1)
SODIUM: 142 meq/L (ref 136–145)
TOTAL PROTEIN: 6.8 g/dL (ref 6.4–8.3)

## 2014-11-22 LAB — TECHNOLOGIST REVIEW

## 2014-11-22 LAB — LACTATE DEHYDROGENASE (CC13): LDH: 148 U/L (ref 125–245)

## 2014-11-27 ENCOUNTER — Telehealth: Payer: Self-pay | Admitting: Oncology

## 2014-11-27 NOTE — Telephone Encounter (Signed)
Returned call re r/s 10/20 f/u. Left message for patient informing him that next availability is not until December and ask that he call back to r/s if December is ok. No changes made at this time.

## 2014-11-29 ENCOUNTER — Encounter: Payer: 59 | Admitting: Oncology

## 2014-11-30 NOTE — Progress Notes (Signed)
This encounter was created in error - please disregard.

## 2014-12-14 ENCOUNTER — Ambulatory Visit (INDEPENDENT_AMBULATORY_CARE_PROVIDER_SITE_OTHER): Payer: Commercial Managed Care - HMO | Admitting: Ophthalmology

## 2014-12-14 DIAGNOSIS — I10 Essential (primary) hypertension: Secondary | ICD-10-CM | POA: Diagnosis not present

## 2014-12-14 DIAGNOSIS — E113593 Type 2 diabetes mellitus with proliferative diabetic retinopathy without macular edema, bilateral: Secondary | ICD-10-CM

## 2014-12-14 DIAGNOSIS — H35033 Hypertensive retinopathy, bilateral: Secondary | ICD-10-CM

## 2014-12-14 DIAGNOSIS — H43813 Vitreous degeneration, bilateral: Secondary | ICD-10-CM | POA: Diagnosis not present

## 2014-12-14 DIAGNOSIS — E11319 Type 2 diabetes mellitus with unspecified diabetic retinopathy without macular edema: Secondary | ICD-10-CM

## 2014-12-17 ENCOUNTER — Other Ambulatory Visit: Payer: Self-pay | Admitting: Adult Health

## 2014-12-17 NOTE — Telephone Encounter (Signed)
Ok to refill 

## 2014-12-17 NOTE — Telephone Encounter (Signed)
Ok to refill for 3 months.  

## 2015-03-04 ENCOUNTER — Other Ambulatory Visit: Payer: Self-pay | Admitting: Family Medicine

## 2015-04-05 ENCOUNTER — Other Ambulatory Visit: Payer: Self-pay

## 2015-04-05 MED ORDER — MELOXICAM 7.5 MG PO TABS: ORAL_TABLET | ORAL | Status: DC

## 2015-06-21 ENCOUNTER — Ambulatory Visit (INDEPENDENT_AMBULATORY_CARE_PROVIDER_SITE_OTHER): Payer: 59 | Admitting: Ophthalmology

## 2015-07-12 ENCOUNTER — Ambulatory Visit (INDEPENDENT_AMBULATORY_CARE_PROVIDER_SITE_OTHER): Payer: Commercial Managed Care - HMO | Admitting: Ophthalmology

## 2015-07-12 DIAGNOSIS — H2513 Age-related nuclear cataract, bilateral: Secondary | ICD-10-CM | POA: Diagnosis not present

## 2015-07-12 DIAGNOSIS — I10 Essential (primary) hypertension: Secondary | ICD-10-CM | POA: Diagnosis not present

## 2015-07-12 DIAGNOSIS — E11319 Type 2 diabetes mellitus with unspecified diabetic retinopathy without macular edema: Secondary | ICD-10-CM | POA: Diagnosis not present

## 2015-07-12 DIAGNOSIS — E113593 Type 2 diabetes mellitus with proliferative diabetic retinopathy without macular edema, bilateral: Secondary | ICD-10-CM

## 2015-07-12 DIAGNOSIS — H43813 Vitreous degeneration, bilateral: Secondary | ICD-10-CM

## 2015-07-12 DIAGNOSIS — H35033 Hypertensive retinopathy, bilateral: Secondary | ICD-10-CM | POA: Diagnosis not present

## 2015-07-29 ENCOUNTER — Encounter: Payer: Self-pay | Admitting: *Deleted

## 2015-07-30 ENCOUNTER — Other Ambulatory Visit (HOSPITAL_BASED_OUTPATIENT_CLINIC_OR_DEPARTMENT_OTHER): Payer: Commercial Managed Care - HMO

## 2015-07-30 ENCOUNTER — Other Ambulatory Visit: Payer: Self-pay | Admitting: Family Medicine

## 2015-07-30 ENCOUNTER — Other Ambulatory Visit: Payer: Self-pay | Admitting: Oncology

## 2015-07-30 ENCOUNTER — Telehealth: Payer: Self-pay | Admitting: Oncology

## 2015-07-30 ENCOUNTER — Ambulatory Visit (HOSPITAL_BASED_OUTPATIENT_CLINIC_OR_DEPARTMENT_OTHER): Payer: Commercial Managed Care - HMO | Admitting: Oncology

## 2015-07-30 VITALS — BP 144/78 | HR 77 | Temp 97.8°F | Resp 17 | Ht 68.5 in | Wt 159.4 lb

## 2015-07-30 DIAGNOSIS — C9110 Chronic lymphocytic leukemia of B-cell type not having achieved remission: Secondary | ICD-10-CM

## 2015-07-30 DIAGNOSIS — C8300 Small cell B-cell lymphoma, unspecified site: Secondary | ICD-10-CM

## 2015-07-30 DIAGNOSIS — E119 Type 2 diabetes mellitus without complications: Secondary | ICD-10-CM

## 2015-07-30 LAB — CBC WITH DIFFERENTIAL/PLATELET
BASO%: 0.2 % (ref 0.0–2.0)
BASOS ABS: 0.1 10*3/uL (ref 0.0–0.1)
EOS ABS: 0.4 10*3/uL (ref 0.0–0.5)
EOS%: 1 % (ref 0.0–7.0)
HEMATOCRIT: 41.2 % (ref 38.4–49.9)
HEMOGLOBIN: 13.4 g/dL (ref 13.0–17.1)
LYMPH#: 37.5 10*3/uL — AB (ref 0.9–3.3)
LYMPH%: 86.1 % — ABNORMAL HIGH (ref 14.0–49.0)
MCH: 30.2 pg (ref 27.2–33.4)
MCHC: 32.5 g/dL (ref 32.0–36.0)
MCV: 92.9 fL (ref 79.3–98.0)
MONO#: 0.9 10*3/uL (ref 0.1–0.9)
MONO%: 2.1 % (ref 0.0–14.0)
NEUT#: 4.6 10*3/uL (ref 1.5–6.5)
NEUT%: 10.6 % — ABNORMAL LOW (ref 39.0–75.0)
Platelets: 202 10*3/uL (ref 140–400)
RBC: 4.44 10*6/uL (ref 4.20–5.82)
RDW: 13.2 % (ref 11.0–14.6)
WBC: 43.5 10*3/uL — ABNORMAL HIGH (ref 4.0–10.3)

## 2015-07-30 LAB — COMPREHENSIVE METABOLIC PANEL
ALBUMIN: 4.2 g/dL (ref 3.5–5.0)
ALT: 15 U/L (ref 0–55)
AST: 20 U/L (ref 5–34)
Alkaline Phosphatase: 69 U/L (ref 40–150)
Anion Gap: 9 mEq/L (ref 3–11)
BUN: 14.5 mg/dL (ref 7.0–26.0)
CALCIUM: 8.9 mg/dL (ref 8.4–10.4)
CO2: 24 mEq/L (ref 22–29)
Chloride: 107 mEq/L (ref 98–109)
Creatinine: 1.1 mg/dL (ref 0.7–1.3)
EGFR: 87 mL/min/{1.73_m2} — AB (ref >90)
Glucose: 189 mg/dl — ABNORMAL HIGH (ref 70–140)
POTASSIUM: 4.3 meq/L (ref 3.5–5.1)
Sodium: 140 mEq/L (ref 136–145)
Total Bilirubin: 1.01 mg/dL (ref 0.20–1.20)
Total Protein: 6.9 g/dL (ref 6.4–8.3)

## 2015-07-30 LAB — TECHNOLOGIST REVIEW

## 2015-07-30 MED ORDER — PROCHLORPERAZINE MALEATE 10 MG PO TABS: 10.0000 mg | ORAL_TABLET | Freq: Four times a day (QID) | ORAL | Status: DC | PRN

## 2015-07-30 NOTE — Progress Notes (Signed)
[place] OFFICE PROGRESS NOTE 07/30/2015  Tyler Man, MD [place] [id number]  DIAGNOSIS: 60 year old with small lymphocytic lymphoma in 2012. He presented with leukocytosis and diffuse lymphadenopathy. He has stage III disease upon presentation.  CURRENT THERAPY: Watchful observation.   INTERVAL HISTORY: Tyler Scott presents today for a follow-up visit. Since his last visit, he reports few complaints. He has reported mild cervical adenopathy which has progressed since the last visit. He also reported some night sweats at times. He denied any fevers or chills. Denies any weight loss. He denies early satiety or abdominal pain. He continues to work full-time without any decline. He remains fairly active and the symptoms are not interfering with his daily function. He denied any lymphadenopathy anywhere else.   He does not report any headaches, blurry vision, syncope or seizures. He does not report any cough, wheezing or hemoptysis. He does not report any chest pain or palpitation. He does not report any nausea, vomiting, abdominal pain. Does not report any genitourinary complaints. Does not report any skeletal complaints. Rest of review of systems unremarkable.  MEDICAL HISTORY: Past Medical History  Diagnosis Date  . HYPERLIPIDEMIA 12/30/2006  . HYPERTENSION 12/30/2006  . ERECTILE DYSFUNCTION, ORGANIC 12/30/2006  . LOW BACK PAIN, ACUTE 12/21/2006  . Diabetic retinopathy   . Allergy   . Arthritis   . Hx of adenomatous polyp of colon 05/24/2014  . CLL (chronic lymphocytic leukemia)     stage III: asymptomatic; on observation  . DIABETES MELLITUS, TYPE I, UNCONTROLLED 12/30/2006    insulin    ALLERGIES:  has No Known Allergies.  MEDICATIONS: has a current medication list which includes the following prescription(s): aspirin, one touch ultra 2, ketorolac, lantus solostar, levitra, lisinopril, meloxicam, simvastatin, and  prochlorperazine.   PHYSICAL EXAMINATION: ECOG PERFORMANCE STATUS: 0  Blood pressure 144/78, pulse 77, temperature 97.8 F (36.6 C), temperature source Oral, resp. rate 17, height 5' 8.5" (1.74 m), weight 159 lb 6.4 oz (72.303 kg), SpO2 100 %.  GENERAL: Pleasant-appearing gentleman without distress. SKIN: skin color, texture, turgor are normal. No petechiae noted. EYES: normal, sclerae anicteric. OROPHARYNX:no exudate. No oral thrush noted. NECK: supple, thyroid normal size, non-tender, without nodularity LYMPH: palpable cervical lymphadenopathy bilaterally ranging between 2-3 cm. No other lymphadenopathy noted. LUNGS: clear to auscultation and percussion with normal breathing effort HEART: regular rate & rhythm and no murmurs and no lower extremity edema ABDOMEN:abdomen soft, non-tender and normal bowel sounds. Slightly enlarged spleen noted. Musculoskeletal:no cyanosis of digits and no clubbing  NEURO: alert & oriented x 3 with fluent speech, no focal motor/sensory deficits  Labs:  Lab Results  Component Value Date   WBC 43.5* 07/30/2015   HGB 13.4 07/30/2015   HCT 41.2 07/30/2015   MCV 92.9 07/30/2015   PLT 202 07/30/2015   NEUTROABS 4.6 07/30/2015      Chemistry      Component Value Date/Time   NA 140 07/30/2015 0927   NA 137 04/26/2014 1134   K 4.3 07/30/2015 0927   K 4.0 04/26/2014 1134   CL 103 04/26/2014 1134   CL 102 04/25/2012 1003   CO2 24 07/30/2015 0927   CO2 30 04/26/2014 1134   BUN 14.5 07/30/2015 0927   BUN 15 04/26/2014 1134   CREATININE 1.1 07/30/2015 0927   CREATININE 0.94 04/26/2014 1134      Component Value Date/Time   CALCIUM 8.9 07/30/2015 0927   CALCIUM 9.5 04/26/2014 1134   ALKPHOS 69 07/30/2015 0927  ALKPHOS 66 07/26/2013 1113   AST 20 07/30/2015 0927   AST 17 07/26/2013 1113   ALT 15 07/30/2015 0927   ALT 13 07/26/2013 1113   BILITOT 1.01 07/30/2015 0927   BILITOT 1.0 07/26/2013 1113          ASSESSMENT: 60 year old  gentleman with the following issues   1. Small lymphocytic lymphoma, stage III with diffuse adenopathy and splenomegaly diagnosed in 2012. He continues to be on active surveillance at this time.  His laboratory data were reviewed today and showed slight increase in his white cell count. He is becoming more symptomatic at this time with enlarging lymphadenopathy as well as night sweats.  The natural course of this disease was reviewed again as well as his last CT scan was reviewed. Appears that his disease had progressed over the last year and is becoming more symptomatic and the rationale for treatment was discussed today. Without treatment, I anticipate his symptoms will continue and likely will worsen over a period of time. He might developing more painful lymphadenopathy and possible worsening constitutional symptoms.  I gave him the option of continuing observation surveillance versus active treatment. Risks and benefits of systemic therapy using bendamustine and rituximab was discussed today. These complications would include nausea, fatigue, tiredness, myelosuppression, neutropenia, neutropenic sepsis as well as the infusion related complications associated with rituximab. These include pruritus, rash, hives and rarely anaphylaxis. The benefit will be possibly putting his disease in remission and alleviate some of the symptoms he is experiencing.  After discussion today he is agreeable to proceed with definitive treatment using bendamustine and rituximab. I anticipate he will require 4-6 cycles to achieve complete response. He would like to start that the second week of July after chemotherapy education class. We will obtain staging CT scan and preparation to start therapy.  2. DM2 on insulin: Management per his PCP. We will continue to monitor his blood sugar which I anticipate will increase slightly because of chemotherapy and antiemetics utilizing dexamethasone.  3. IV access: Peripheral  veins will be used and a possible Port-A-Cath insertion will be contemplated she has issues with IV access.  4. Antiemetics: Prescription for Compazine made available to patients.  5. Follow-up: Will be in 3 weeks in anticipation to start first cycle of chemotherapy.    [facility], MD 07/30/2015 10:20 AM

## 2015-07-30 NOTE — Telephone Encounter (Signed)
Gave and printed appt sched and avs for pt for June July and Aug

## 2015-08-05 ENCOUNTER — Encounter (HOSPITAL_COMMUNITY): Payer: Self-pay

## 2015-08-05 ENCOUNTER — Ambulatory Visit (HOSPITAL_COMMUNITY)
Admission: RE | Admit: 2015-08-05 | Discharge: 2015-08-05 | Disposition: A | Discharge status: Home / Self Care | Payer: Commercial Managed Care - HMO | Source: Ambulatory Visit | Attending: Oncology | Admitting: Oncology

## 2015-08-05 DIAGNOSIS — J3489 Other specified disorders of nose and nasal sinuses: Secondary | ICD-10-CM | POA: Insufficient documentation

## 2015-08-05 DIAGNOSIS — C9110 Chronic lymphocytic leukemia of B-cell type not having achieved remission: Secondary | ICD-10-CM

## 2015-08-05 DIAGNOSIS — R590 Localized enlarged lymph nodes: Secondary | ICD-10-CM | POA: Insufficient documentation

## 2015-08-05 DIAGNOSIS — I700 Atherosclerosis of aorta: Secondary | ICD-10-CM | POA: Insufficient documentation

## 2015-08-05 MED ORDER — IOPAMIDOL (ISOVUE-300) INJECTION 61%
100.0000 mL | Freq: Once | INTRAVENOUS | Status: AC | PRN
  Administered 2015-08-05: 100 mL via INTRAVENOUS

## 2015-08-06 ENCOUNTER — Encounter: Payer: Self-pay | Admitting: *Deleted

## 2015-08-06 ENCOUNTER — Other Ambulatory Visit: Payer: Commercial Managed Care - HMO

## 2015-08-21 ENCOUNTER — Ambulatory Visit (HOSPITAL_BASED_OUTPATIENT_CLINIC_OR_DEPARTMENT_OTHER): Payer: Commercial Managed Care - HMO | Admitting: Oncology

## 2015-08-21 ENCOUNTER — Other Ambulatory Visit (HOSPITAL_BASED_OUTPATIENT_CLINIC_OR_DEPARTMENT_OTHER): Payer: Commercial Managed Care - HMO

## 2015-08-21 VITALS — BP 120/55 | HR 89 | Temp 98.0°F | Resp 18 | Ht 68.5 in | Wt 157.5 lb

## 2015-08-21 DIAGNOSIS — E119 Type 2 diabetes mellitus without complications: Secondary | ICD-10-CM

## 2015-08-21 DIAGNOSIS — C9110 Chronic lymphocytic leukemia of B-cell type not having achieved remission: Secondary | ICD-10-CM

## 2015-08-21 DIAGNOSIS — C8301 Small cell B-cell lymphoma, lymph nodes of head, face, and neck: Secondary | ICD-10-CM

## 2015-08-21 LAB — CBC WITH DIFFERENTIAL/PLATELET
BASO%: 0.3 % (ref 0.0–2.0)
Basophils Absolute: 0.1 10*3/uL (ref 0.0–0.1)
EOS%: 0.6 % (ref 0.0–7.0)
Eosinophils Absolute: 0.3 10*3/uL (ref 0.0–0.5)
HEMATOCRIT: 41 % (ref 38.4–49.9)
HGB: 13.2 g/dL (ref 13.0–17.1)
LYMPH#: 36.5 10*3/uL — AB (ref 0.9–3.3)
LYMPH%: 83.7 % — ABNORMAL HIGH (ref 14.0–49.0)
MCH: 29.9 pg (ref 27.2–33.4)
MCHC: 32.1 g/dL (ref 32.0–36.0)
MCV: 93.2 fL (ref 79.3–98.0)
MONO#: 0.8 10*3/uL (ref 0.1–0.9)
MONO%: 1.9 % (ref 0.0–14.0)
NEUT#: 5.9 10*3/uL (ref 1.5–6.5)
NEUT%: 13.5 % — ABNORMAL LOW (ref 39.0–75.0)
Platelets: 259 10*3/uL (ref 140–400)
RBC: 4.4 10*6/uL (ref 4.20–5.82)
RDW: 13.4 % (ref 11.0–14.6)
WBC: 43.7 10*3/uL — ABNORMAL HIGH (ref 4.0–10.3)

## 2015-08-21 LAB — COMPREHENSIVE METABOLIC PANEL
ALT: 14 U/L (ref 0–55)
AST: 19 U/L (ref 5–34)
Albumin: 4.5 g/dL (ref 3.5–5.0)
Alkaline Phosphatase: 71 U/L (ref 40–150)
Anion Gap: 9 mEq/L (ref 3–11)
BUN: 14 mg/dL (ref 7.0–26.0)
CALCIUM: 9.5 mg/dL (ref 8.4–10.4)
CO2: 24 mEq/L (ref 22–29)
CREATININE: 1 mg/dL (ref 0.7–1.3)
Chloride: 106 mEq/L (ref 98–109)
EGFR: >90 mL/min/{1.73_m2} (ref >90)
GLUCOSE: 133 mg/dL (ref 70–140)
Potassium: 4.5 mEq/L (ref 3.5–5.1)
Sodium: 139 mEq/L (ref 136–145)
Total Bilirubin: 1.58 mg/dL — ABNORMAL HIGH (ref 0.20–1.20)
Total Protein: 7.3 g/dL (ref 6.4–8.3)

## 2015-08-21 LAB — TECHNOLOGIST REVIEW

## 2015-08-21 NOTE — Progress Notes (Signed)
[facility] OFFICE PROGRESS NOTE 08/21/2015  Tyler Man, MD [place] [id number]  DIAGNOSIS: 60 year old with small lymphocytic lymphoma in 2012. He presented with leukocytosis and diffuse lymphadenopathy. He has stage III disease upon presentation.  CURRENT THERAPY: Systemic chemotherapy utilizing bendamustine and rituximab cycle one scheduled for 08/22/2015.  INTERVAL HISTORY: Tyler Scott presents today for a follow-up visit. Since his last visit, he reports no changes. He continues to report cervical adenopathy which have not changed dramatically. He attended chemotherapy education class and ready to proceed. He also reported some night sweats at times. He denied any fevers or chills. Denies any weight loss. He denies early satiety or abdominal pain. He continues to work full-time without any decline.  He does not report any headaches, blurry vision, syncope or seizures. He does not report any cough, wheezing or hemoptysis. He does not report any chest pain or palpitation. He does not report any nausea, vomiting, abdominal pain. Does not report any genitourinary complaints. Does not report any skeletal complaints. Rest of review of systems unremarkable.  MEDICAL HISTORY: Past Medical History  Diagnosis Date  . HYPERLIPIDEMIA 12/30/2006  . HYPERTENSION 12/30/2006  . ERECTILE DYSFUNCTION, ORGANIC 12/30/2006  . LOW BACK PAIN, ACUTE 12/21/2006  . Diabetic retinopathy   . Allergy   . Arthritis   . Hx of adenomatous polyp of colon 05/24/2014  . CLL (chronic lymphocytic leukemia) ([facility])     stage III: asymptomatic; on observation  . DIABETES MELLITUS, TYPE I, UNCONTROLLED 12/30/2006    insulin    ALLERGIES:  has No Known Allergies.  MEDICATIONS: has a current medication list which includes the following prescription(s): aspirin, one touch ultra 2, ketorolac, lantus solostar, levitra, lisinopril, meloxicam, prochlorperazine, and  simvastatin.   PHYSICAL EXAMINATION: ECOG PERFORMANCE STATUS: 0  Blood pressure 120/55, pulse 89, temperature 98 F (36.7 C), temperature source Oral, resp. rate 18, height 5' 8.5" (1.74 m), weight 157 lb 8 oz (71.442 kg), SpO2 99 %.  GENERAL: Alert, awake gentleman without distress. SKIN: skin color, texture, turgor are normal. No petechiae noted. EYES: normal, sclerae anicteric. OROPHARYNX:no exudate. No oral thrush noted. NECK: supple, thyroid normal size, non-tender, without nodularity LYMPH: palpable cervical lymphadenopathy bilaterally ranging between 2-3 cm. no axillary, inguinal adenopathy noted. LUNGS: clear to auscultation and percussion with normal breathing effort HEART: regular rate & rhythm and no murmurs and no lower extremity edema ABDOMEN:abdomen soft, non-tender and normal bowel sounds. Slightly enlarged spleen noted. Musculoskeletal:no cyanosis of digits and no clubbing  NEURO: No deficits noted.  Labs:  Lab Results  Component Value Date   WBC 43.7* 08/21/2015   HGB 13.2 08/21/2015   HCT 41.0 08/21/2015   MCV 93.2 08/21/2015   PLT 259 08/21/2015   NEUTROABS 5.9 08/21/2015      Chemistry      Component Value Date/Time   NA 139 08/21/2015 1529   NA 137 04/26/2014 1134   K 4.5 08/21/2015 1529   K 4.0 04/26/2014 1134   CL 103 04/26/2014 1134   CL 102 04/25/2012 1003   CO2 24 08/21/2015 1529   CO2 30 04/26/2014 1134   BUN 14.0 08/21/2015 1529   BUN 15 04/26/2014 1134   CREATININE 1.0 08/21/2015 1529   CREATININE 0.94 04/26/2014 1134      Component Value Date/Time   CALCIUM 9.5 08/21/2015 1529   CALCIUM 9.5 04/26/2014 1134   ALKPHOS 71 08/21/2015 1529   ALKPHOS 66 07/26/2013 1113   AST 19 08/21/2015 1529  AST 17 07/26/2013 1113   ALT 14 08/21/2015 1529   ALT 13 07/26/2013 1113   BILITOT 1.58* 08/21/2015 1529   BILITOT 1.0 07/26/2013 1113      EXAM: CT CHEST, ABDOMEN AND PELVIS WITHOUT CONTRAST  TECHNIQUE: Multidetector CT imaging of  the chest, abdomen and pelvis was performed following the standard protocol without IV contrast.  COMPARISON: CT CAP 07/31/2014  FINDINGS: CT CHEST FINDINGS  Mediastinum/Lymph Nodes: Slight interval increase in size of superior mediastinal and supraclavicular lymphadenopathy. Reference left supraclavicular lymph node measures 2.0 cm (image 5; series 2), previously 1.9 cm. Reference adjacent left peritracheal lymph node measures 2.2 cm (image 6; series 2), previously 1.8 cm. Reference left axilla lymph node measures 1.8 cm (image 18; series 2), previously 1.6 cm. Reference right axillary lymph node measures 1.8 cm (image 16; series 2), previously 1.5 cm. Reference left prevascular lymph node measures 1.1 cm (image 25; series 2), previously 1.0 cm. Normal heart size. No pericardial effusion. Aorta and main pulmonary artery are normal in caliber.  Lungs/Pleura: Central airways are patent. Unchanged 3 mm subpleural nodule left lower lobe (Image 96; series 4). No new or enlarging pulmonary nodules. Dependent atelectasis in the bilateral lower lobes. No pleural effusion or pneumothorax.  Musculoskeletal: No aggressive or acute appearing osseous lesions.  CT ABDOMEN PELVIS FINDINGS  Hepatobiliary: Liver is normal in size and contour. No focal hepatic lesion is identified. Gallbladder is decompressed. No intrahepatic or extrahepatic delayed ductal dilatation.  Pancreas: Unremarkable  Spleen: Unremarkable  Adrenals/Urinary Tract: The adrenal glands are normal. Kidneys enhance symmetrically with contrast. Unchanged bilateral renal cysts. Smaller sub cm low-attenuation renal lesions are too small to characterize.  Stomach/Bowel: No abnormal bowel wall thickening or evidence for bowel obstruction. No free fluid or free intraperitoneal air.  Vascular/Lymphatic: Normal caliber abdominal aorta. Calcified atherosclerotic plaque. Slight interval increase in  retroperitoneal, mesenteric and pelvic lymphadenopathy. Left periaortic nodal conglomerate measures 3.0 x 2.5 cm (image 72; series 2), previously 3.0 x 1.9 cm. Left aortic lymph node measures 2.4 cm (image 77; series 2), previously 2.0 cm. Left pelvic sidewall lymph node measures 1.6 cm (image 107; series 2), previously 1.2 cm. Right pelvic sidewall lymph node measures 1.8 cm (image 107; series 2), previously 1.7 cm.  Other: Prostate is unremarkable.  Musculoskeletal: Lumbar spine degenerative changes. No aggressive or acute appearing osseous lesions.  IMPRESSION: Slight interval increase in size of diffuse lymphadenopathy throughout the chest, abdomen and pelvis as described above.  Calcified atherosclerotic plaque of the aorta.     ASSESSMENT: 60 year old gentleman with the following issues   1. Small lymphocytic lymphoma, stage III with diffuse adenopathy and splenomegaly diagnosed in 2012. He was under active surveillance since his diagnosis but in 2017 developed symptomatic cervical adenopathy.  CT scan obtained on 08/05/2015 was reviewed today and discussed with the patient which showed progressive lymphadenopathy. He does have diffuse and bulky adenopathy that warrants treatment at this time.   Risks and benefits of systemic therapy using bendamustine and rituximab was discussed again today. These complications would include nausea, fatigue, tiredness, myelosuppression, neutropenia, neutropenic sepsis as well as the infusion related complications associated with rituximab. These include pruritus, rash, hives and rarely anaphylaxis. The benefit will be possibly putting his disease in remission and alleviate some of the symptoms he is experiencing.  He is willing to proceed and his laboratory data from today were reviewed and is adequate for him to start chemotherapy on 08/22/2015.  2. DM2 on insulin: Management per his PCP.  We will continue to monitor his blood sugar  which I anticipate will increase slightly because of chemotherapy and antiemetics utilizing dexamethasone.  3. IV access: Peripheral veins will be used and a possible Port-A-Cath insertion was discussed again and declined. This will be addressed in the future again if axis becomes an issue.  4. Antiemetics: Prescription for Compazine made available to patients.  5. Follow-up: Will be in on 713 to start cycle 1 of chemotherapy. Cycle bruisability scheduled for August 2017.    [facility], MD 08/21/2015 4:26 PM

## 2015-08-22 ENCOUNTER — Ambulatory Visit (HOSPITAL_BASED_OUTPATIENT_CLINIC_OR_DEPARTMENT_OTHER): Payer: Commercial Managed Care - HMO

## 2015-08-22 VITALS — BP 129/63 | HR 84 | Temp 98.8°F | Resp 18

## 2015-08-22 DIAGNOSIS — Z5111 Encounter for antineoplastic chemotherapy: Secondary | ICD-10-CM

## 2015-08-22 DIAGNOSIS — Z5112 Encounter for antineoplastic immunotherapy: Secondary | ICD-10-CM

## 2015-08-22 DIAGNOSIS — C8301 Small cell B-cell lymphoma, lymph nodes of head, face, and neck: Secondary | ICD-10-CM | POA: Diagnosis not present

## 2015-08-22 DIAGNOSIS — C9110 Chronic lymphocytic leukemia of B-cell type not having achieved remission: Secondary | ICD-10-CM

## 2015-08-22 LAB — HEPATITIS B SURFACE ANTIBODY,QUALITATIVE: HEP B SURFACE AB, QUAL: NONREACTIVE

## 2015-08-22 LAB — HEPATITIS B SURFACE ANTIGEN: HBsAg Screen: NEGATIVE

## 2015-08-22 LAB — HEPATITIS B CORE ANTIBODY, IGM: HEP B C IGM: NEGATIVE

## 2015-08-22 MED ORDER — SODIUM CHLORIDE 0.9 % IV SOLN
10.0000 mg | Freq: Once | INTRAVENOUS | Status: AC
  Administered 2015-08-22: 10 mg via INTRAVENOUS
  Filled 2015-08-22: qty 1

## 2015-08-22 MED ORDER — RITUXIMAB CHEMO INJECTION 500 MG/50ML
375.0000 mg/m2 | Freq: Once | INTRAVENOUS | Status: AC
  Administered 2015-08-22: 700 mg via INTRAVENOUS
  Filled 2015-08-22: qty 60

## 2015-08-22 MED ORDER — SODIUM CHLORIDE 0.9 % IV SOLN
Freq: Once | INTRAVENOUS | Status: AC
  Administered 2015-08-22: 10:00:00 via INTRAVENOUS

## 2015-08-22 MED ORDER — ACETAMINOPHEN 325 MG PO TABS
ORAL_TABLET | ORAL | Status: AC
  Filled 2015-08-22: qty 2

## 2015-08-22 MED ORDER — BENDAMUSTINE HCL CHEMO INJECTION 100 MG/4ML
90.0000 mg/m2 | Freq: Once | INTRAVENOUS | Status: AC
  Administered 2015-08-22: 175 mg via INTRAVENOUS
  Filled 2015-08-22: qty 7

## 2015-08-22 MED ORDER — ACETAMINOPHEN 325 MG PO TABS
650.0000 mg | ORAL_TABLET | Freq: Once | ORAL | Status: AC
  Administered 2015-08-22: 650 mg via ORAL

## 2015-08-22 MED ORDER — DIPHENHYDRAMINE HCL 25 MG PO CAPS
50.0000 mg | ORAL_CAPSULE | Freq: Once | ORAL | Status: AC
  Administered 2015-08-22: 50 mg via ORAL

## 2015-08-22 MED ORDER — PALONOSETRON HCL INJECTION 0.25 MG/5ML
INTRAVENOUS | Status: AC
  Filled 2015-08-22: qty 5

## 2015-08-22 MED ORDER — DIPHENHYDRAMINE HCL 25 MG PO CAPS
ORAL_CAPSULE | ORAL | Status: AC
  Filled 2015-08-22: qty 2

## 2015-08-22 MED ORDER — PALONOSETRON HCL INJECTION 0.25 MG/5ML
0.2500 mg | Freq: Once | INTRAVENOUS | Status: AC
  Administered 2015-08-22: 0.25 mg via INTRAVENOUS

## 2015-08-22 NOTE — Patient Instructions (Signed)
[facility] Discharge Instructions for Patients Receiving Chemotherapy  Today you received the following chemotherapy agents Rituxan & Bendeka  To help prevent nausea and vomiting after your treatment, we encourage you to take your nausea medication as directed.  If you develop nausea and vomiting that is not controlled by your nausea medication, call the clinic.   BELOW ARE SYMPTOMS THAT SHOULD BE REPORTED IMMEDIATELY:  *FEVER GREATER THAN 100.5 F  *CHILLS WITH OR WITHOUT FEVER  NAUSEA AND VOMITING THAT IS NOT CONTROLLED WITH YOUR NAUSEA MEDICATION  *UNUSUAL SHORTNESS OF BREATH  *UNUSUAL BRUISING OR BLEEDING  TENDERNESS IN MOUTH AND THROAT WITH OR WITHOUT PRESENCE OF ULCERS  *URINARY PROBLEMS  *BOWEL PROBLEMS  UNUSUAL RASH Items with * indicate a potential emergency and should be followed up as soon as possible.  Feel free to call the clinic you have any questions or concerns. The clinic phone number is [phone].  Please show the [facility] at check-in to the Emergency Department and triage nurse.   Rituximab injection What is this medicine? RITUXIMAB (ri TUX i mab) is a monoclonal antibody. It is used commonly to treat non-Hodgkin lymphoma and other conditions. It is also used to treat rheumatoid arthritis (RA). In RA, this medicine slows the inflammatory process and help reduce joint pain and swelling. This medicine is often used with other cancer or arthritis medications. This medicine may be used for other purposes; ask your health care provider or pharmacist if you have questions. What should I tell my health care provider before I take this medicine? They need to know if you have any of these conditions: -blood disorders -heart disease -history of hepatitis B -infection (especially a virus infection such as chickenpox, cold sores, or herpes) -irregular heartbeat -kidney disease -lung or breathing disease, like asthma -lupus -an unusual  or allergic reaction to rituximab, mouse proteins, other medicines, foods, dyes, or preservatives -pregnant or trying to get pregnant -breast-feeding How should I use this medicine? This medicine is for infusion into a vein. It is administered in a hospital or clinic by a specially trained health care professional. A special MedGuide will be given to you by the pharmacist with each prescription and refill. Be sure to read this information carefully each time. Talk to your pediatrician regarding the use of this medicine in children. This medicine is not approved for use in children. Overdosage: If you think you have taken too much of this medicine contact a poison control center or emergency room at once. NOTE: This medicine is only for you. Do not share this medicine with others. What if I miss a dose? It is important not to miss a dose. Call your doctor or health care professional if you are unable to keep an appointment. What may interact with this medicine? -cisplatin -medicines for blood pressure -some other medicines for arthritis -vaccines This list may not describe all possible interactions. Give your health care provider a list of all the medicines, herbs, non-prescription drugs, or dietary supplements you use. Also tell them if you smoke, drink alcohol, or use illegal drugs. Some items may interact with your medicine. What should I watch for while using this medicine? Report any side effects that you notice during your treatment right away, such as changes in your breathing, fever, chills, dizziness or lightheadedness. These effects are more common with the first dose. Visit your prescriber or health care professional for checks on your progress. You will need to have regular blood work.  Report any other side effects. The side effects of this medicine can continue after you finish your treatment. Continue your course of treatment even though you feel ill unless your doctor tells you to  stop. Call your doctor or health care professional for advice if you get a fever, chills or sore throat, or other symptoms of a cold or flu. Do not treat yourself. This drug decreases your body's ability to fight infections. Try to avoid being around people who are sick. This medicine may increase your risk to bruise or bleed. Call your doctor or health care professional if you notice any unusual bleeding. Be careful brushing and flossing your teeth or using a toothpick because you may get an infection or bleed more easily. If you have any dental work done, tell your dentist you are receiving this medicine. Avoid taking products that contain aspirin, acetaminophen, ibuprofen, naproxen, or ketoprofen unless instructed by your doctor. These medicines may hide a fever. Do not become pregnant while taking this medicine. Women should inform their doctor if they wish to become pregnant or think they might be pregnant. There is a potential for serious side effects to an unborn child. Talk to your health care professional or pharmacist for more information. Do not breast-feed an infant while taking this medicine. What side effects may I notice from receiving this medicine? Side effects that you should report to your doctor or health care professional as soon as possible: -allergic reactions like skin rash, itching or hives, swelling of the face, lips, or tongue -low blood counts - this medicine may decrease the number of white blood cells, red blood cells and platelets. You may be at increased risk for infections and bleeding. -signs of infection - fever or chills, cough, sore throat, pain or difficulty passing urine -signs of decreased platelets or bleeding - bruising, pinpoint red spots on the skin, black, tarry stools, blood in the urine -signs of decreased red blood cells - unusually weak or tired, fainting spells, lightheadedness -breathing problems -confused, not responsive -chest pain -fast, irregular  heartbeat -feeling faint or lightheaded, falls -mouth sores -redness, blistering, peeling or loosening of the skin, including inside the mouth -stomach pain -swelling of the ankles, feet, or hands -trouble passing urine or change in the amount of urine Side effects that usually do not require medical attention (report to your doctor or other health care professional if they continue or are bothersome): -anxiety -headache -loss of appetite -muscle aches -nausea -night sweats This list may not describe all possible side effects. Call your doctor for medical advice about side effects. You may report side effects to FDA at 1-800-FDA-1088. Where should I keep my medicine? This drug is given in a hospital or clinic and will not be stored at home. NOTE: This sheet is a summary. It may not cover all possible information. If you have questions about this medicine, talk to your doctor, pharmacist, or health care provider.    2016, Elsevier/Gold Standard. (2014-04-04 22:30:56) Bendamustine Injection What is this medicine? BENDAMUSTINE (BEN da MUS teen) is a chemotherapy drug. It is used to treat chronic lymphocytic leukemia and non-Hodgkin lymphoma. This medicine may be used for other purposes; ask your health care provider or pharmacist if you have questions. What should I tell my health care provider before I take this medicine? They need to know if you have any of these conditions: -infection (especially a virus infection such as chickenpox, cold sores, or herpes) -kidney disease -liver disease -an unusual or  allergic reaction to bendamustine, mannitol, other medicines, foods, dyes, or preservatives -pregnant or trying to get pregnant -breast-feeding How should I use this medicine? This medicine is for infusion into a vein. It is given by a health care professional in a hospital or clinic setting. Talk to your pediatrician regarding the use of this medicine in children. Special care may be  needed. Overdosage: If you think you have taken too much of this medicine contact a poison control center or emergency room at once. NOTE: This medicine is only for you. Do not share this medicine with others. What if I miss a dose? It is important not to miss your dose. Call your doctor or health care professional if you are unable to keep an appointment. What may interact with this medicine? Do not take this medicine with any of the following medications: -clozapine This medicine may also interact with the following medications: -atazanavir -cimetidine -ciprofloxacin -enoxacin -fluvoxamine -medicines for seizures like carbamazepine and phenobarbital -mexiletine -rifampin -tacrine -thiabendazole -zileuton This list may not describe all possible interactions. Give your health care provider a list of all the medicines, herbs, non-prescription drugs, or dietary supplements you use. Also tell them if you smoke, drink alcohol, or use illegal drugs. Some items may interact with your medicine. What should I watch for while using this medicine? This drug may make you feel generally unwell. This is not uncommon, as chemotherapy can affect healthy cells as well as cancer cells. Report any side effects. Continue your course of treatment even though you feel ill unless your doctor tells you to stop. You may need blood work done while you are taking this medicine. Call your doctor or health care professional for advice if you get a fever, chills or sore throat, or other symptoms of a cold or flu. Do not treat yourself. This drug decreases your body's ability to fight infections. Try to avoid being around people who are sick. This medicine may increase your risk to bruise or bleed. Call your doctor or health care professional if you notice any unusual bleeding. Talk to your doctor about your risk of cancer. You may be more at risk for certain types of cancers if you take this medicine. Do not become  pregnant while taking this medicine or for 3 months after stopping it. Women should inform their doctor if they wish to become pregnant or think they might be pregnant. Men should not father a child while taking this medicine and for 3 months after stopping it.There is a potential for serious side effects to an unborn child. Talk to your health care professional or pharmacist for more information. Do not breast-feed an infant while taking this medicine. This medicine may interfere with the ability to have a child. You should talk with your doctor or health care professional if you are concerned about your fertility. What side effects may I notice from receiving this medicine? Side effects that you should report to your doctor or health care professional as soon as possible: -allergic reactions like skin rash, itching or hives, swelling of the face, lips, or tongue -low blood counts - this medicine may decrease the number of white blood cells, red blood cells and platelets. You may be at increased risk for infections and bleeding. -redness, blistering, peeling or loosening of the skin, including inside the mouth -signs of infection - fever or chills, cough, sore throat, pain or difficulty passing urine -signs of decreased platelets or bleeding - bruising, pinpoint red spots on the  skin, black, tarry stools, blood in the urine -signs of decreased red blood cells - unusually weak or tired, fainting spells, lightheadedness -signs and symptoms of kidney injury like trouble passing urine or change in the amount of urine Side effects that usually do not require medical attention (report to your doctor or health care professional if they continue or are bothersome): -constipation -decreased appetite -diarrhea -headache -mouth sores -nausea/vomiting -tiredness This list may not describe all possible side effects. Call your doctor for medical advice about side effects. You may report side effects to FDA at  1-800-FDA-1088. Where should I keep my medicine? This drug is given in a hospital or clinic and will not be stored at home. NOTE: This sheet is a summary. It may not cover all possible information. If you have questions about this medicine, talk to your doctor, pharmacist, or health care provider.    2016, Elsevier/Gold Standard. (2014-01-19 13:53:28)

## 2015-08-23 ENCOUNTER — Ambulatory Visit (HOSPITAL_BASED_OUTPATIENT_CLINIC_OR_DEPARTMENT_OTHER): Payer: Commercial Managed Care - HMO

## 2015-08-23 VITALS — BP 140/71 | HR 82 | Temp 98.0°F | Resp 17

## 2015-08-23 DIAGNOSIS — C9110 Chronic lymphocytic leukemia of B-cell type not having achieved remission: Secondary | ICD-10-CM

## 2015-08-23 DIAGNOSIS — C8301 Small cell B-cell lymphoma, lymph nodes of head, face, and neck: Secondary | ICD-10-CM

## 2015-08-23 DIAGNOSIS — Z5111 Encounter for antineoplastic chemotherapy: Secondary | ICD-10-CM | POA: Diagnosis not present

## 2015-08-23 MED ORDER — SODIUM CHLORIDE 0.9 % IV SOLN
90.0000 mg/m2 | Freq: Once | INTRAVENOUS | Status: AC
  Administered 2015-08-23: 175 mg via INTRAVENOUS
  Filled 2015-08-23: qty 7

## 2015-08-23 MED ORDER — DEXAMETHASONE SODIUM PHOSPHATE 100 MG/10ML IJ SOLN
10.0000 mg | Freq: Once | INTRAMUSCULAR | Status: AC
  Administered 2015-08-23: 10 mg via INTRAVENOUS
  Filled 2015-08-23: qty 1

## 2015-08-23 MED ORDER — SODIUM CHLORIDE 0.9 % IV SOLN
Freq: Once | INTRAVENOUS | Status: AC
  Administered 2015-08-23: 10:00:00 via INTRAVENOUS

## 2015-08-23 NOTE — Patient Instructions (Signed)
[facility] Discharge Instructions for Patients Receiving Chemotherapy  Today you received the following chemotherapy agents Bendeka  To help prevent nausea and vomiting after your treatment, we encourage you to take your nausea medication as prescribed. If you develop nausea and vomiting that is not controlled by your nausea medication, call the clinic.   BELOW ARE SYMPTOMS THAT SHOULD BE REPORTED IMMEDIATELY:  *FEVER GREATER THAN 100.5 F  *CHILLS WITH OR WITHOUT FEVER  NAUSEA AND VOMITING THAT IS NOT CONTROLLED WITH YOUR NAUSEA MEDICATION  *UNUSUAL SHORTNESS OF BREATH  *UNUSUAL BRUISING OR BLEEDING  TENDERNESS IN MOUTH AND THROAT WITH OR WITHOUT PRESENCE OF ULCERS  *URINARY PROBLEMS  *BOWEL PROBLEMS  UNUSUAL RASH Items with * indicate a potential emergency and should be followed up as soon as possible.  Feel free to call the clinic you have any questions or concerns. The clinic phone number is [phone].  Please show the CHEMO ALERT CARD at check-in to the Emergency Department and triage nurse.   

## 2015-09-19 ENCOUNTER — Other Ambulatory Visit (HOSPITAL_BASED_OUTPATIENT_CLINIC_OR_DEPARTMENT_OTHER): Payer: Commercial Managed Care - HMO

## 2015-09-19 ENCOUNTER — Ambulatory Visit (HOSPITAL_BASED_OUTPATIENT_CLINIC_OR_DEPARTMENT_OTHER): Payer: Commercial Managed Care - HMO | Admitting: Oncology

## 2015-09-19 ENCOUNTER — Ambulatory Visit (HOSPITAL_BASED_OUTPATIENT_CLINIC_OR_DEPARTMENT_OTHER): Payer: Commercial Managed Care - HMO

## 2015-09-19 VITALS — BP 147/73 | HR 69 | Temp 98.1°F | Resp 18

## 2015-09-19 VITALS — BP 138/67 | HR 74 | Temp 97.7°F | Resp 18 | Ht 68.5 in | Wt 156.7 lb

## 2015-09-19 DIAGNOSIS — Z5111 Encounter for antineoplastic chemotherapy: Secondary | ICD-10-CM | POA: Diagnosis not present

## 2015-09-19 DIAGNOSIS — C9110 Chronic lymphocytic leukemia of B-cell type not having achieved remission: Secondary | ICD-10-CM

## 2015-09-19 DIAGNOSIS — Z5112 Encounter for antineoplastic immunotherapy: Secondary | ICD-10-CM | POA: Diagnosis not present

## 2015-09-19 DIAGNOSIS — E119 Type 2 diabetes mellitus without complications: Secondary | ICD-10-CM | POA: Diagnosis not present

## 2015-09-19 DIAGNOSIS — C8300 Small cell B-cell lymphoma, unspecified site: Secondary | ICD-10-CM

## 2015-09-19 LAB — COMPREHENSIVE METABOLIC PANEL
ALT: 11 U/L (ref 0–55)
AST: 15 U/L (ref 5–34)
Albumin: 3.9 g/dL (ref 3.5–5.0)
Alkaline Phosphatase: 63 U/L (ref 40–150)
Anion Gap: 9 mEq/L (ref 3–11)
BILIRUBIN TOTAL: 0.56 mg/dL (ref 0.20–1.20)
BUN: 17.9 mg/dL (ref 7.0–26.0)
CHLORIDE: 109 meq/L (ref 98–109)
CO2: 25 meq/L (ref 22–29)
CREATININE: 0.9 mg/dL (ref 0.7–1.3)
Calcium: 9.3 mg/dL (ref 8.4–10.4)
GLUCOSE: 132 mg/dL (ref 70–140)
Potassium: 4.2 mEq/L (ref 3.5–5.1)
SODIUM: 143 meq/L (ref 136–145)
TOTAL PROTEIN: 6.8 g/dL (ref 6.4–8.3)

## 2015-09-19 LAB — CBC WITH DIFFERENTIAL/PLATELET
BASO%: 0.4 % (ref 0.0–2.0)
Basophils Absolute: 0 10*3/uL (ref 0.0–0.1)
EOS%: 4 % (ref 0.0–7.0)
Eosinophils Absolute: 0.2 10*3/uL (ref 0.0–0.5)
HCT: 37.1 % — ABNORMAL LOW (ref 38.4–49.9)
HGB: 12.8 g/dL — ABNORMAL LOW (ref 13.0–17.1)
LYMPH%: 30.5 % (ref 14.0–49.0)
MCH: 31.1 pg (ref 27.2–33.4)
MCHC: 34.5 g/dL (ref 32.0–36.0)
MCV: 90.3 fL (ref 79.3–98.0)
MONO#: 0.5 10*3/uL (ref 0.1–0.9)
MONO%: 8.6 % (ref 0.0–14.0)
NEUT%: 56.5 % (ref 39.0–75.0)
NEUTROS ABS: 3 10*3/uL (ref 1.5–6.5)
Platelets: 179 10*3/uL (ref 140–400)
RBC: 4.11 10*6/uL — AB (ref 4.20–5.82)
RDW: 13.3 % (ref 11.0–14.6)
WBC: 5.2 10*3/uL (ref 4.0–10.3)
lymph#: 1.6 10*3/uL (ref 0.9–3.3)

## 2015-09-19 MED ORDER — ACETAMINOPHEN 325 MG PO TABS
650.0000 mg | ORAL_TABLET | Freq: Once | ORAL | Status: AC
  Administered 2015-09-19: 650 mg via ORAL

## 2015-09-19 MED ORDER — SODIUM CHLORIDE 0.9 % IV SOLN
70.0000 mg/m2 | Freq: Once | INTRAVENOUS | Status: AC
  Administered 2015-09-19: 125 mg via INTRAVENOUS
  Filled 2015-09-19: qty 5

## 2015-09-19 MED ORDER — DIPHENHYDRAMINE HCL 25 MG PO CAPS
ORAL_CAPSULE | ORAL | Status: AC
  Filled 2015-09-19: qty 2

## 2015-09-19 MED ORDER — SODIUM CHLORIDE 0.9 % IV SOLN
4.0000 mg | Freq: Once | INTRAVENOUS | Status: AC
  Administered 2015-09-19: 4 mg via INTRAVENOUS
  Filled 2015-09-19: qty 0.4

## 2015-09-19 MED ORDER — HEPARIN SOD (PORK) LOCK FLUSH 100 UNIT/ML IV SOLN
500.0000 [IU] | Freq: Once | INTRAVENOUS | Status: DC | PRN
  Filled 2015-09-19: qty 5

## 2015-09-19 MED ORDER — PALONOSETRON HCL INJECTION 0.25 MG/5ML
INTRAVENOUS | Status: AC
  Filled 2015-09-19: qty 5

## 2015-09-19 MED ORDER — PALONOSETRON HCL INJECTION 0.25 MG/5ML
0.2500 mg | Freq: Once | INTRAVENOUS | Status: AC
  Administered 2015-09-19: 0.25 mg via INTRAVENOUS

## 2015-09-19 MED ORDER — ACETAMINOPHEN 325 MG PO TABS
ORAL_TABLET | ORAL | Status: AC
  Filled 2015-09-19: qty 2

## 2015-09-19 MED ORDER — SODIUM CHLORIDE 0.9 % IV SOLN
Freq: Once | INTRAVENOUS | Status: AC
  Administered 2015-09-19: 10:00:00 via INTRAVENOUS

## 2015-09-19 MED ORDER — SODIUM CHLORIDE 0.9 % IV SOLN: 375.0000 mg/m2 | Freq: Once | INTRAVENOUS | Status: DC

## 2015-09-19 MED ORDER — SODIUM CHLORIDE 0.9 % IV SOLN
375.0000 mg/m2 | Freq: Once | INTRAVENOUS | Status: AC
  Administered 2015-09-19: 700 mg via INTRAVENOUS
  Filled 2015-09-19: qty 50

## 2015-09-19 MED ORDER — SODIUM CHLORIDE 0.9% FLUSH
10.0000 mL | INTRAVENOUS | Status: DC | PRN
  Filled 2015-09-19: qty 10

## 2015-09-19 MED ORDER — DIPHENHYDRAMINE HCL 25 MG PO CAPS
50.0000 mg | ORAL_CAPSULE | Freq: Once | ORAL | Status: AC
  Administered 2015-09-19: 50 mg via ORAL

## 2015-09-19 NOTE — Patient Instructions (Signed)
[facility] Discharge Instructions for Patients Receiving Chemotherapy  Today you received the following chemotherapy agents Rituxan & Bendeka  To help prevent nausea and vomiting after your treatment, we encourage you to take your nausea medication as directed.  If you develop nausea and vomiting that is not controlled by your nausea medication, call the clinic.   BELOW ARE SYMPTOMS THAT SHOULD BE REPORTED IMMEDIATELY:  *FEVER GREATER THAN 100.5 F  *CHILLS WITH OR WITHOUT FEVER  NAUSEA AND VOMITING THAT IS NOT CONTROLLED WITH YOUR NAUSEA MEDICATION  *UNUSUAL SHORTNESS OF BREATH  *UNUSUAL BRUISING OR BLEEDING  TENDERNESS IN MOUTH AND THROAT WITH OR WITHOUT PRESENCE OF ULCERS  *URINARY PROBLEMS  *BOWEL PROBLEMS  UNUSUAL RASH Items with * indicate a potential emergency and should be followed up as soon as possible.  Feel free to call the clinic you have any questions or concerns. The clinic phone number is [phone].  Please show the [facility] at check-in to the Emergency Department and triage nurse.   Rituximab injection What is this medicine? RITUXIMAB (ri TUX i mab) is a monoclonal antibody. It is used commonly to treat non-Hodgkin lymphoma and other conditions. It is also used to treat rheumatoid arthritis (RA). In RA, this medicine slows the inflammatory process and help reduce joint pain and swelling. This medicine is often used with other cancer or arthritis medications. This medicine may be used for other purposes; ask your health care provider or pharmacist if you have questions. What should I tell my health care provider before I take this medicine? They need to know if you have any of these conditions: -blood disorders -heart disease -history of hepatitis B -infection (especially a virus infection such as chickenpox, cold sores, or herpes) -irregular heartbeat -kidney disease -lung or breathing disease, like asthma -lupus -an unusual  or allergic reaction to rituximab, mouse proteins, other medicines, foods, dyes, or preservatives -pregnant or trying to get pregnant -breast-feeding How should I use this medicine? This medicine is for infusion into a vein. It is administered in a hospital or clinic by a specially trained health care professional. A special MedGuide will be given to you by the pharmacist with each prescription and refill. Be sure to read this information carefully each time. Talk to your pediatrician regarding the use of this medicine in children. This medicine is not approved for use in children. Overdosage: If you think you have taken too much of this medicine contact a poison control center or emergency room at once. NOTE: This medicine is only for you. Do not share this medicine with others. What if I miss a dose? It is important not to miss a dose. Call your doctor or health care professional if you are unable to keep an appointment. What may interact with this medicine? -cisplatin -medicines for blood pressure -some other medicines for arthritis -vaccines This list may not describe all possible interactions. Give your health care provider a list of all the medicines, herbs, non-prescription drugs, or dietary supplements you use. Also tell them if you smoke, drink alcohol, or use illegal drugs. Some items may interact with your medicine. What should I watch for while using this medicine? Report any side effects that you notice during your treatment right away, such as changes in your breathing, fever, chills, dizziness or lightheadedness. These effects are more common with the first dose. Visit your prescriber or health care professional for checks on your progress. You will need to have regular blood work.  Report any other side effects. The side effects of this medicine can continue after you finish your treatment. Continue your course of treatment even though you feel ill unless your doctor tells you to  stop. Call your doctor or health care professional for advice if you get a fever, chills or sore throat, or other symptoms of a cold or flu. Do not treat yourself. This drug decreases your body's ability to fight infections. Try to avoid being around people who are sick. This medicine may increase your risk to bruise or bleed. Call your doctor or health care professional if you notice any unusual bleeding. Be careful brushing and flossing your teeth or using a toothpick because you may get an infection or bleed more easily. If you have any dental work done, tell your dentist you are receiving this medicine. Avoid taking products that contain aspirin, acetaminophen, ibuprofen, naproxen, or ketoprofen unless instructed by your doctor. These medicines may hide a fever. Do not become pregnant while taking this medicine. Women should inform their doctor if they wish to become pregnant or think they might be pregnant. There is a potential for serious side effects to an unborn child. Talk to your health care professional or pharmacist for more information. Do not breast-feed an infant while taking this medicine. What side effects may I notice from receiving this medicine? Side effects that you should report to your doctor or health care professional as soon as possible: -allergic reactions like skin rash, itching or hives, swelling of the face, lips, or tongue -low blood counts - this medicine may decrease the number of white blood cells, red blood cells and platelets. You may be at increased risk for infections and bleeding. -signs of infection - fever or chills, cough, sore throat, pain or difficulty passing urine -signs of decreased platelets or bleeding - bruising, pinpoint red spots on the skin, black, tarry stools, blood in the urine -signs of decreased red blood cells - unusually weak or tired, fainting spells, lightheadedness -breathing problems -confused, not responsive -chest pain -fast, irregular  heartbeat -feeling faint or lightheaded, falls -mouth sores -redness, blistering, peeling or loosening of the skin, including inside the mouth -stomach pain -swelling of the ankles, feet, or hands -trouble passing urine or change in the amount of urine Side effects that usually do not require medical attention (report to your doctor or other health care professional if they continue or are bothersome): -anxiety -headache -loss of appetite -muscle aches -nausea -night sweats This list may not describe all possible side effects. Call your doctor for medical advice about side effects. You may report side effects to FDA at 1-800-FDA-1088. Where should I keep my medicine? This drug is given in a hospital or clinic and will not be stored at home. NOTE: This sheet is a summary. It may not cover all possible information. If you have questions about this medicine, talk to your doctor, pharmacist, or health care provider.    2016, Elsevier/Gold Standard. (2014-04-04 22:30:56) Bendamustine Injection What is this medicine? BENDAMUSTINE (BEN da MUS teen) is a chemotherapy drug. It is used to treat chronic lymphocytic leukemia and non-Hodgkin lymphoma. This medicine may be used for other purposes; ask your health care provider or pharmacist if you have questions. What should I tell my health care provider before I take this medicine? They need to know if you have any of these conditions: -infection (especially a virus infection such as chickenpox, cold sores, or herpes) -kidney disease -liver disease -an unusual or  allergic reaction to bendamustine, mannitol, other medicines, foods, dyes, or preservatives -pregnant or trying to get pregnant -breast-feeding How should I use this medicine? This medicine is for infusion into a vein. It is given by a health care professional in a hospital or clinic setting. Talk to your pediatrician regarding the use of this medicine in children. Special care may be  needed. Overdosage: If you think you have taken too much of this medicine contact a poison control center or emergency room at once. NOTE: This medicine is only for you. Do not share this medicine with others. What if I miss a dose? It is important not to miss your dose. Call your doctor or health care professional if you are unable to keep an appointment. What may interact with this medicine? Do not take this medicine with any of the following medications: -clozapine This medicine may also interact with the following medications: -atazanavir -cimetidine -ciprofloxacin -enoxacin -fluvoxamine -medicines for seizures like carbamazepine and phenobarbital -mexiletine -rifampin -tacrine -thiabendazole -zileuton This list may not describe all possible interactions. Give your health care provider a list of all the medicines, herbs, non-prescription drugs, or dietary supplements you use. Also tell them if you smoke, drink alcohol, or use illegal drugs. Some items may interact with your medicine. What should I watch for while using this medicine? This drug may make you feel generally unwell. This is not uncommon, as chemotherapy can affect healthy cells as well as cancer cells. Report any side effects. Continue your course of treatment even though you feel ill unless your doctor tells you to stop. You may need blood work done while you are taking this medicine. Call your doctor or health care professional for advice if you get a fever, chills or sore throat, or other symptoms of a cold or flu. Do not treat yourself. This drug decreases your body's ability to fight infections. Try to avoid being around people who are sick. This medicine may increase your risk to bruise or bleed. Call your doctor or health care professional if you notice any unusual bleeding. Talk to your doctor about your risk of cancer. You may be more at risk for certain types of cancers if you take this medicine. Do not become  pregnant while taking this medicine or for 3 months after stopping it. Women should inform their doctor if they wish to become pregnant or think they might be pregnant. Men should not father a child while taking this medicine and for 3 months after stopping it.There is a potential for serious side effects to an unborn child. Talk to your health care professional or pharmacist for more information. Do not breast-feed an infant while taking this medicine. This medicine may interfere with the ability to have a child. You should talk with your doctor or health care professional if you are concerned about your fertility. What side effects may I notice from receiving this medicine? Side effects that you should report to your doctor or health care professional as soon as possible: -allergic reactions like skin rash, itching or hives, swelling of the face, lips, or tongue -low blood counts - this medicine may decrease the number of white blood cells, red blood cells and platelets. You may be at increased risk for infections and bleeding. -redness, blistering, peeling or loosening of the skin, including inside the mouth -signs of infection - fever or chills, cough, sore throat, pain or difficulty passing urine -signs of decreased platelets or bleeding - bruising, pinpoint red spots on the  skin, black, tarry stools, blood in the urine -signs of decreased red blood cells - unusually weak or tired, fainting spells, lightheadedness -signs and symptoms of kidney injury like trouble passing urine or change in the amount of urine Side effects that usually do not require medical attention (report to your doctor or health care professional if they continue or are bothersome): -constipation -decreased appetite -diarrhea -headache -mouth sores -nausea/vomiting -tiredness This list may not describe all possible side effects. Call your doctor for medical advice about side effects. You may report side effects to FDA at  1-800-FDA-1088. Where should I keep my medicine? This drug is given in a hospital or clinic and will not be stored at home. NOTE: This sheet is a summary. It may not cover all possible information. If you have questions about this medicine, talk to your doctor, pharmacist, or health care provider.    2016, Elsevier/Gold Standard. (2014-01-19 13:53:28)

## 2015-09-19 NOTE — Progress Notes (Signed)
[facility] OFFICE PROGRESS NOTE 09/19/15  [name], MD [place] [address]  DIAGNOSIS: 60 year old with small lymphocytic lymphoma in 2012. He presented with leukocytosis and diffuse lymphadenopathy. He has stage III disease upon presentation.  CURRENT THERAPY: Systemic chemotherapy utilizing bendamustine and rituximab cycle one given on 08/22/2015. He is here for evaluation before cycle 2.  INTERVAL HISTORY: Mr. Gillogly presents today for a follow-up visit. Since his last visit, received the first cycle of bendamustine and rituximab and experienced few complications. He reported a grade 1 nausea as well as grade 1 fatigue. He also reported headaches that lasted for 2-3 days and resolved subsequently. He had to miss 1 day work on Monday following chemotherapy infusion on Thursday and Friday of the previous week. His appetite is back to normal and have not lost any weight. He reports his lymphadenopathy have resolved at this time and completely asymptomatic.   He does not report any headaches, blurry vision, syncope or seizures. He does not report any cough, wheezing or hemoptysis. He does not report any chest pain or palpitation. He does not report any nausea, vomiting, abdominal pain. Does not report any genitourinary complaints. Does not report any skeletal complaints. Rest of review of systems unremarkable.  MEDICAL HISTORY: Past Medical History:  Diagnosis Date  . Allergy   . Arthritis   . CLL (chronic lymphocytic leukemia) ([place])    stage III: asymptomatic; on observation  . DIABETES MELLITUS, TYPE I, UNCONTROLLED 12/30/2006   insulin  . Diabetic retinopathy   . ERECTILE DYSFUNCTION, ORGANIC 12/30/2006  . Hx of adenomatous polyp of colon 05/24/2014  . HYPERLIPIDEMIA 12/30/2006  . HYPERTENSION 12/30/2006  . LOW BACK PAIN, ACUTE 12/21/2006    ALLERGIES:  has No Known Allergies.  MEDICATIONS: has a current medication list which  includes the following prescription(s): aspirin, one touch ultra 2, ketorolac, lantus solostar, levitra, lisinopril, meloxicam, prochlorperazine, and simvastatin.   PHYSICAL EXAMINATION: ECOG PERFORMANCE STATUS: 0  Blood pressure 138/67, pulse 74, temperature 97.7 F (36.5 C), temperature source Oral, resp. rate 18, height 5' 8.5" (1.74 m), weight 156 lb 11.2 oz (71.1 kg), SpO2 100 %. ECOG 0 GENERAL: Well-appearing gentleman without distress. SKIN: skin color, texture, turgor are normal. No ecchymosis noted. EYES: normal, sclerae anicteric. OROPHARYNX:no exudate. No oral thrush noted. NECK: supple, thyroid normal size, non-tender, without nodularity LYMPH: His cervical adenopathy is no longer palpable. LUNGS: clear to auscultation and percussion with normal breathing effort HEART: regular rate & rhythm and no murmurs and no lower extremity edema ABDOMEN:abdomen soft, non-tender and normal bowel sounds. Splenomegaly noted. Musculoskeletal:no cyanosis of digits and no clubbing  NEURO: No deficits noted.  Labs:  Lab Results  Component Value Date   WBC 5.2 09/19/2015   HGB 12.8 (L) 09/19/2015   HCT 37.1 (L) 09/19/2015   MCV 90.3 09/19/2015   PLT 179 09/19/2015   NEUTROABS 3.0 09/19/2015      Chemistry      Component Value Date/Time   NA 143 09/19/2015 0844   K 4.2 09/19/2015 0844   CL 103 04/26/2014 1134   CL 102 04/25/2012 1003   CO2 25 09/19/2015 0844   BUN 17.9 09/19/2015 0844   CREATININE 0.9 09/19/2015 0844      Component Value Date/Time   CALCIUM 9.3 09/19/2015 0844   ALKPHOS 63 09/19/2015 0844   AST 15 09/19/2015 0844   ALT 11 09/19/2015 0844   BILITOT 0.56 09/19/2015 0844  ASSESSMENT: 60 year old gentleman with the following issues   1. Small lymphocytic lymphoma, stage III with diffuse adenopathy and splenomegaly diagnosed in 2012. He was under active surveillance since his diagnosis but in 2017 developed symptomatic cervical adenopathy.  CT scan  obtained on 08/05/2015 was reviewed today and discussed with the patient which showed progressive lymphadenopathy. He does have diffuse and bulky adenopathy that warrants treatment at this time.  He received the first cycle of bendamustine and rituximab with few complications. He had excellent response with resolution of those adenopathy. He expressed interest in discontinuation of chemotherapy.  Regularly option of discontinuation of therapy versus trying cycle 2 reduced dose of bendamustine. Discontinuation of therapy is an option but he is at risk of relapsed disease and after discussion today is agreeable to proceed with cycle 2. We will evaluate him in 3 weeks and determine whether he will proceed with any further chemotherapy beyond that.  2. DM2 on insulin: Management per his PCP. His blood sugar did increase with the chemotherapy administration. We will decrease his dexamethasone and attempt to decrease his complications associated with that.  3. IV access: Peripheral veins will be used moving forward.  4. Antiemetics: Prescription for Compazine made available to patients.  5. Follow-up: Will be in 3 weeks.    [facility], MD 09/19/15 9:23 AM

## 2015-09-20 ENCOUNTER — Ambulatory Visit: Payer: Commercial Managed Care - HMO

## 2015-10-09 ENCOUNTER — Ambulatory Visit (HOSPITAL_BASED_OUTPATIENT_CLINIC_OR_DEPARTMENT_OTHER): Payer: Commercial Managed Care - HMO | Admitting: Oncology

## 2015-10-09 ENCOUNTER — Telehealth: Payer: Self-pay | Admitting: Oncology

## 2015-10-09 ENCOUNTER — Other Ambulatory Visit (HOSPITAL_BASED_OUTPATIENT_CLINIC_OR_DEPARTMENT_OTHER): Payer: Commercial Managed Care - HMO

## 2015-10-09 VITALS — BP 144/71 | HR 81 | Temp 97.8°F | Resp 18 | Wt 156.0 lb

## 2015-10-09 DIAGNOSIS — C8300 Small cell B-cell lymphoma, unspecified site: Secondary | ICD-10-CM | POA: Diagnosis not present

## 2015-10-09 DIAGNOSIS — C9110 Chronic lymphocytic leukemia of B-cell type not having achieved remission: Secondary | ICD-10-CM

## 2015-10-09 DIAGNOSIS — E119 Type 2 diabetes mellitus without complications: Secondary | ICD-10-CM | POA: Diagnosis not present

## 2015-10-09 LAB — CBC WITH DIFFERENTIAL/PLATELET
BASO%: 0.4 % (ref 0.0–2.0)
BASOS ABS: 0 10*3/uL (ref 0.0–0.1)
EOS%: 7.5 % — AB (ref 0.0–7.0)
Eosinophils Absolute: 0.4 10*3/uL (ref 0.0–0.5)
HEMATOCRIT: 38 % — AB (ref 38.4–49.9)
HGB: 12.6 g/dL — ABNORMAL LOW (ref 13.0–17.1)
LYMPH#: 0.7 10*3/uL — AB (ref 0.9–3.3)
LYMPH%: 13.8 % — ABNORMAL LOW (ref 14.0–49.0)
MCH: 30.5 pg (ref 27.2–33.4)
MCHC: 33.1 g/dL (ref 32.0–36.0)
MCV: 92.2 fL (ref 79.3–98.0)
MONO#: 0.7 10*3/uL (ref 0.1–0.9)
MONO%: 13.6 % (ref 0.0–14.0)
NEUT#: 3.1 10*3/uL (ref 1.5–6.5)
NEUT%: 64.7 % (ref 39.0–75.0)
Platelets: 256 10*3/uL (ref 140–400)
RBC: 4.12 10*6/uL — ABNORMAL LOW (ref 4.20–5.82)
RDW: 13.8 % (ref 11.0–14.6)
WBC: 4.8 10*3/uL (ref 4.0–10.3)

## 2015-10-09 LAB — COMPREHENSIVE METABOLIC PANEL
ALT: 11 U/L (ref 0–55)
AST: 16 U/L (ref 5–34)
Albumin: 4 g/dL (ref 3.5–5.0)
Alkaline Phosphatase: 71 U/L (ref 40–150)
Anion Gap: 11 mEq/L (ref 3–11)
BUN: 17.2 mg/dL (ref 7.0–26.0)
CALCIUM: 9.3 mg/dL (ref 8.4–10.4)
CHLORIDE: 105 meq/L (ref 98–109)
CO2: 23 mEq/L (ref 22–29)
Creatinine: 1 mg/dL (ref 0.7–1.3)
Glucose: 142 mg/dl — ABNORMAL HIGH (ref 70–140)
POTASSIUM: 4.2 meq/L (ref 3.5–5.1)
Sodium: 139 mEq/L (ref 136–145)
Total Bilirubin: 0.94 mg/dL (ref 0.20–1.20)
Total Protein: 7.1 g/dL (ref 6.4–8.3)

## 2015-10-09 NOTE — Progress Notes (Signed)
[place] OFFICE PROGRESS NOTE 10/09/15  [name], MD [place] [id number]  DIAGNOSIS: 60 year old with small lymphocytic lymphoma in 2012. He presented with leukocytosis and diffuse lymphadenopathy. He has stage III disease upon presentation.   Prior therapy: Bendamustine and rituximab started in July 2017 and received total of 2 cycles. Therapy discontinued in August 2017 based on patient's wishes.   CURRENT THERAPY: Continued observation and surveillance.  INTERVAL HISTORY: Mr. Redhead presents today for a follow-up visit. Since his last visit, received the second cycle of bendamustine and rituximab and experienced a few complaints. He reported grade 1 fatigue, nausea and skin irritation. Although the symptoms are improving slowly and have regained all activities of daily living.His appetite is back to normal and have not lost any weight. He reports his lymphadenopathy have resolved. He denied any fevers or chills or sweats. He denied any other constitutional symptoms.  He does not report any headaches, blurry vision, syncope or seizures. He does not report any cough, wheezing or hemoptysis. He does not report any chest pain or palpitation. He does not report any nausea, vomiting, abdominal pain. Does not report any genitourinary complaints. Does not report any skeletal complaints. Rest of review of systems unremarkable.  MEDICAL HISTORY: Past Medical History:  Diagnosis Date  . Allergy   . Arthritis   . CLL (chronic lymphocytic leukemia) ([place])    stage III: asymptomatic; on observation  . DIABETES MELLITUS, TYPE I, UNCONTROLLED 12/30/2006   insulin  . Diabetic retinopathy   . ERECTILE DYSFUNCTION, ORGANIC 12/30/2006  . Hx of adenomatous polyp of colon 05/24/2014  . HYPERLIPIDEMIA 12/30/2006  . HYPERTENSION 12/30/2006  . LOW BACK PAIN, ACUTE 12/21/2006    ALLERGIES:  has No Known Allergies.  MEDICATIONS: has a current  medication list which includes the following prescription(s): aspirin, one touch ultra 2, ketorolac, lantus solostar, levitra, lisinopril, meloxicam, prochlorperazine, and simvastatin.   PHYSICAL EXAMINATION: ECOG PERFORMANCE STATUS: 0  Blood pressure (!) 144/71, pulse 81, temperature 97.8 F (36.6 C), temperature source Oral, resp. rate 18, weight 156 lb (70.8 kg), SpO2 100 %. ECOG 0 GENERAL: Alert, awake gentleman without distress. SKIN: skin color, texture, turgor are normal. Appeared dry. EYES: normal, sclerae anicteric. OROPHARYNX:no exudate. No oral thrush noted. NECK: supple, thyroid normal size, non-tender, without nodularity LYMPH: His cervical adenopathy is no longer palpable. LUNGS: clear to auscultation and percussion with normal breathing effort HEART: regular rate & rhythm and no murmurs and no lower extremity edema ABDOMEN:abdomen soft, non-tender and normal bowel sounds. Spleen has not been able to be palpated. Musculoskeletal:no cyanosis of digits and no clubbing  NEURO: No deficits noted.  Labs:  Lab Results  Component Value Date   WBC 4.8 10/09/2015   HGB 12.6 (L) 10/09/2015   HCT 38.0 (L) 10/09/2015   MCV 92.2 10/09/2015   PLT 256 10/09/2015   NEUTROABS 3.1 10/09/2015      Chemistry      Component Value Date/Time   NA 143 09/19/2015 0844   K 4.2 09/19/2015 0844   CL 103 04/26/2014 1134   CL 102 04/25/2012 1003   CO2 25 09/19/2015 0844   BUN 17.9 09/19/2015 0844   CREATININE 0.9 09/19/2015 0844      Component Value Date/Time   CALCIUM 9.3 09/19/2015 0844   ALKPHOS 63 09/19/2015 0844   AST 15 09/19/2015 0844   ALT 11 09/19/2015 0844   BILITOT 0.56 09/19/2015 0844        ASSESSMENT: 60 year old  gentleman with the following issues   1. Small lymphocytic lymphoma, stage III with diffuse adenopathy and splenomegaly diagnosed in 2012. He was under active surveillance since his diagnosis but in 2017 developed symptomatic cervical adenopathy.  CT  scan obtained on 08/05/2015 was reviewed today and discussed with the patient which showed progressive lymphadenopathy. He does have diffuse and bulky adenopathy that warrants treatment at this time.  He Is status post 2 cycles of bendamustine and rituximab with few complications. He had excellent response with resolution of those adenopathy. He expressed interest in discontinuation of chemotherapy.  I discussed the risks and benefits of continuing chemotherapy versus discontinuation. Continuing chemotherapy will ensure achieving complete response which will improve his duration of disease-free interval. Discontinuation of chemotherapy will limit any further such as infection among others. He is at risk of disease relapse occurred with discontinuation of chemotherapy. After discussion today, he declined any further chemotherapy and he feels that the objective chemotherapy has been achieved which is an improvement in his cervical adenopathy.  I recommended continued surveillance for the time being as symptoms follow-up in 3 months.  2. DM2 on insulin: Management per his PCP. His blood sugar did increase with the chemotherapy administration. These have normalized off chemotherapy.  3. IV access: No Port-A-Cath has has not been inserted.  4. Antiemetics: Prescription for Compazine made available to patients. No further nausea and vomiting is anticipated.  5. Follow-up: Will be in 3 months.     [facility], MD 10/09/15 9:26 AM

## 2015-10-09 NOTE — Telephone Encounter (Signed)
GAVE PATIENT AVS REPORT AND APPOINTMENTS FOR November.  °

## 2016-01-08 ENCOUNTER — Other Ambulatory Visit: Payer: Self-pay | Admitting: Family Medicine

## 2016-01-08 ENCOUNTER — Ambulatory Visit (HOSPITAL_BASED_OUTPATIENT_CLINIC_OR_DEPARTMENT_OTHER): Payer: Commercial Managed Care - HMO | Admitting: Oncology

## 2016-01-08 ENCOUNTER — Other Ambulatory Visit (HOSPITAL_BASED_OUTPATIENT_CLINIC_OR_DEPARTMENT_OTHER): Payer: Commercial Managed Care - HMO

## 2016-01-08 ENCOUNTER — Telehealth: Payer: Self-pay | Admitting: Oncology

## 2016-01-08 VITALS — BP 139/74 | HR 87 | Temp 98.4°F | Resp 18 | Ht 68.5 in | Wt 160.0 lb

## 2016-01-08 DIAGNOSIS — C8300 Small cell B-cell lymphoma, unspecified site: Secondary | ICD-10-CM

## 2016-01-08 DIAGNOSIS — E119 Type 2 diabetes mellitus without complications: Secondary | ICD-10-CM | POA: Diagnosis not present

## 2016-01-08 DIAGNOSIS — C9110 Chronic lymphocytic leukemia of B-cell type not having achieved remission: Secondary | ICD-10-CM

## 2016-01-08 LAB — CBC WITH DIFFERENTIAL/PLATELET
BASO%: 0.6 % (ref 0.0–2.0)
Basophils Absolute: 0 10*3/uL (ref 0.0–0.1)
EOS ABS: 0.3 10*3/uL (ref 0.0–0.5)
EOS%: 4.3 % (ref 0.0–7.0)
HCT: 41.7 % (ref 38.4–49.9)
HGB: 13.8 g/dL (ref 13.0–17.1)
LYMPH%: 19.2 % (ref 14.0–49.0)
MCH: 30.3 pg (ref 27.2–33.4)
MCHC: 33.1 g/dL (ref 32.0–36.0)
MCV: 91.4 fL (ref 79.3–98.0)
MONO#: 0.6 10*3/uL (ref 0.1–0.9)
MONO%: 9.2 % (ref 0.0–14.0)
NEUT%: 66.7 % (ref 39.0–75.0)
NEUTROS ABS: 4.2 10*3/uL (ref 1.5–6.5)
Platelets: 242 10*3/uL (ref 140–400)
RBC: 4.56 10*6/uL (ref 4.20–5.82)
RDW: 12.9 % (ref 11.0–14.6)
WBC: 6.3 10*3/uL (ref 4.0–10.3)
lymph#: 1.2 10*3/uL (ref 0.9–3.3)

## 2016-01-08 LAB — COMPREHENSIVE METABOLIC PANEL
ALT: 11 U/L (ref 0–55)
AST: 16 U/L (ref 5–34)
Albumin: 4.1 g/dL (ref 3.5–5.0)
Alkaline Phosphatase: 101 U/L (ref 40–150)
Anion Gap: 10 mEq/L (ref 3–11)
BUN: 20.7 mg/dL (ref 7.0–26.0)
CHLORIDE: 106 meq/L (ref 98–109)
CO2: 25 meq/L (ref 22–29)
Calcium: 9.5 mg/dL (ref 8.4–10.4)
Creatinine: 1.2 mg/dL (ref 0.7–1.3)
EGFR: 77 mL/min/{1.73_m2} — AB (ref >90)
GLUCOSE: 152 mg/dL — AB (ref 70–140)
POTASSIUM: 4.1 meq/L (ref 3.5–5.1)
SODIUM: 142 meq/L (ref 136–145)
TOTAL PROTEIN: 7.6 g/dL (ref 6.4–8.3)
Total Bilirubin: 0.84 mg/dL (ref 0.20–1.20)

## 2016-01-08 LAB — LACTATE DEHYDROGENASE: LDH: 150 U/L (ref 125–245)

## 2016-01-08 NOTE — Progress Notes (Signed)
[place] OFFICE PROGRESS NOTE 01/08/16  [name], MD [place] [address]  DIAGNOSIS: 60 year old with small lymphocytic lymphoma in 2012. He presented with leukocytosis and diffuse lymphadenopathy. He has stage III disease upon presentation.   Prior therapy: Bendamustine and rituximab started in July 2017 and received total of 2 cycles. Therapy discontinued in August 2017 based on patient's wishes.   CURRENT THERAPY: Continued observation and surveillance.  INTERVAL HISTORY: Mr. Pontiff presents today for a follow-up visit. Since his last visit, he reports feeling very well without any delayed complications related to chemotherapy. He does not report any nausea, abdominal pain or peripheral neuropathy. He denied any skin irritation or dryness. He has remained active and continues to attend activities of daily living. He has gained 4 pounds since last visit. He denied any adenopathy at this time and his cervical lymph nodes have normalized.  He does not report any headaches, blurry vision, syncope or seizures. He does not report any cough, wheezing or hemoptysis. He does not report any chest pain or palpitation. He does not report any nausea, vomiting, abdominal pain. Does not report any genitourinary complaints. Does not report any skeletal complaints. Rest of review of systems unremarkable.  MEDICAL HISTORY: Past Medical History:  Diagnosis Date  . Allergy   . Arthritis   . CLL (chronic lymphocytic leukemia) ([facility])    stage III: asymptomatic; on observation  . DIABETES MELLITUS, TYPE I, UNCONTROLLED 12/30/2006   insulin  . Diabetic retinopathy   . ERECTILE DYSFUNCTION, ORGANIC 12/30/2006  . Hx of adenomatous polyp of colon 05/24/2014  . HYPERLIPIDEMIA 12/30/2006  . HYPERTENSION 12/30/2006  . LOW BACK PAIN, ACUTE 12/21/2006    ALLERGIES:  has No Known Allergies.  MEDICATIONS: has a current medication list which includes the  following prescription(s): aspirin, one touch ultra 2, ketorolac, lantus solostar, levitra, lisinopril, meloxicam, prochlorperazine, and simvastatin.   PHYSICAL EXAMINATION: ECOG PERFORMANCE STATUS: 0  Blood pressure 139/74, pulse 87, temperature 98.4 F (36.9 C), temperature source Oral, resp. rate 18, height 5' 8.5" (1.74 m), weight 160 lb (72.6 kg), SpO2 100 %. ECOG 0 GENERAL: Well-appearing gentleman without distress. SKIN: skin color, texture, turgor are normal. No rashes or ecchymosis. EYES: normal, sclerae anicteric. OROPHARYNX:no exudate. No oral ulcers or lesions. NECK: supple, thyroid normal size, non-tender, without nodularity LYMPH: His cervical adenopathy is no longer palpable. LUNGS: clear to auscultation and percussion with normal breathing effort HEART: regular rate & rhythm and no murmurs and no lower extremity edema ABDOMEN:abdomen soft, non-tender and normal bowel sounds. Shifting dullness or ascites. Musculoskeletal:no cyanosis of digits and no clubbing  NEURO: No deficits noted.  Labs:  Lab Results  Component Value Date   WBC 6.3 01/08/2016   HGB 13.8 01/08/2016   HCT 41.7 01/08/2016   MCV 91.4 01/08/2016   PLT 242 01/08/2016   NEUTROABS 4.2 01/08/2016      Chemistry      Component Value Date/Time   NA 139 10/09/2015 0901   K 4.2 10/09/2015 0901   CL 103 04/26/2014 1134   CL 102 04/25/2012 1003   CO2 23 10/09/2015 0901   BUN 17.2 10/09/2015 0901   CREATININE 1.0 10/09/2015 0901      Component Value Date/Time   CALCIUM 9.3 10/09/2015 0901   ALKPHOS 71 10/09/2015 0901   AST 16 10/09/2015 0901   ALT 11 10/09/2015 0901   BILITOT 0.94 10/09/2015 0901        ASSESSMENT: 60 year old gentleman with the  following issues   1. Small lymphocytic lymphoma, stage III with diffuse adenopathy and splenomegaly diagnosed in 2012. He was under active surveillance since his diagnosis but in 2017 developed symptomatic cervical adenopathy.  CT scan obtained  on 08/05/2015 was reviewed today and discussed with the patient which showed progressive lymphadenopathy. He does have diffuse and bulky adenopathy that warrants treatment at this time.  He Is status post 2 cycles of bendamustine and rituximab with few complications. He had excellent response with resolution of those adenopathy. He expressed interest in discontinuation of chemotherapy.  His physical examination and laboratory data from today showed no evidence of recurrent disease. I have recommended continued observation and surveillance and restart chemotherapy if he develops symptomatic lymphadenopathy.    2. DM2 on insulin: Management per his PCP. No major changes in his blood sugar.  3. Follow-up: Will be in 3 months.     [facility], MD 01/08/16 9:18 AM

## 2016-01-08 NOTE — Telephone Encounter (Signed)
Appointments scheduled per 01/08/16 los. A copy of the AVS report and appointment schedule was given to patient, per 01/08/16 los. °

## 2016-01-17 ENCOUNTER — Ambulatory Visit (INDEPENDENT_AMBULATORY_CARE_PROVIDER_SITE_OTHER): Payer: Commercial Managed Care - HMO | Admitting: Ophthalmology

## 2016-01-20 ENCOUNTER — Ambulatory Visit (INDEPENDENT_AMBULATORY_CARE_PROVIDER_SITE_OTHER): Payer: Commercial Managed Care - HMO | Admitting: Ophthalmology

## 2016-01-20 DIAGNOSIS — E11319 Type 2 diabetes mellitus with unspecified diabetic retinopathy without macular edema: Secondary | ICD-10-CM

## 2016-01-20 DIAGNOSIS — I10 Essential (primary) hypertension: Secondary | ICD-10-CM

## 2016-01-20 DIAGNOSIS — H43813 Vitreous degeneration, bilateral: Secondary | ICD-10-CM | POA: Diagnosis not present

## 2016-01-20 DIAGNOSIS — E113593 Type 2 diabetes mellitus with proliferative diabetic retinopathy without macular edema, bilateral: Secondary | ICD-10-CM | POA: Diagnosis not present

## 2016-01-20 DIAGNOSIS — H35033 Hypertensive retinopathy, bilateral: Secondary | ICD-10-CM | POA: Diagnosis not present

## 2016-04-13 ENCOUNTER — Other Ambulatory Visit: Payer: Self-pay | Admitting: Family Medicine

## 2016-05-06 ENCOUNTER — Other Ambulatory Visit: Payer: Commercial Managed Care - HMO

## 2016-05-06 ENCOUNTER — Ambulatory Visit: Payer: Commercial Managed Care - HMO | Admitting: Oncology

## 2016-06-30 ENCOUNTER — Encounter: Payer: Self-pay | Admitting: Family Medicine

## 2016-06-30 ENCOUNTER — Ambulatory Visit (INDEPENDENT_AMBULATORY_CARE_PROVIDER_SITE_OTHER): Payer: Commercial Managed Care - HMO | Admitting: Family Medicine

## 2016-06-30 VITALS — BP 126/70 | Temp 97.7°F | Ht 68.5 in | Wt 159.0 lb

## 2016-06-30 DIAGNOSIS — E785 Hyperlipidemia, unspecified: Secondary | ICD-10-CM

## 2016-06-30 DIAGNOSIS — I10 Essential (primary) hypertension: Secondary | ICD-10-CM | POA: Diagnosis not present

## 2016-06-30 DIAGNOSIS — Z8042 Family history of malignant neoplasm of prostate: Secondary | ICD-10-CM | POA: Diagnosis not present

## 2016-06-30 DIAGNOSIS — Z23 Encounter for immunization: Secondary | ICD-10-CM

## 2016-06-30 DIAGNOSIS — N529 Male erectile dysfunction, unspecified: Secondary | ICD-10-CM

## 2016-06-30 DIAGNOSIS — C9110 Chronic lymphocytic leukemia of B-cell type not having achieved remission: Secondary | ICD-10-CM

## 2016-06-30 DIAGNOSIS — Z136 Encounter for screening for cardiovascular disorders: Secondary | ICD-10-CM

## 2016-06-30 DIAGNOSIS — E1040 Type 1 diabetes mellitus with diabetic neuropathy, unspecified: Secondary | ICD-10-CM | POA: Diagnosis not present

## 2016-06-30 LAB — CBC WITH DIFFERENTIAL/PLATELET
BASOS PCT: 0.4 % (ref 0.0–3.0)
Basophils Absolute: 0 10*3/uL (ref 0.0–0.1)
EOS ABS: 0.2 10*3/uL (ref 0.0–0.7)
Eosinophils Relative: 1.4 % (ref 0.0–5.0)
HEMATOCRIT: 40 % (ref 39.0–52.0)
Hemoglobin: 13.3 g/dL (ref 13.0–17.0)
Lymphocytes Relative: 52.2 % — ABNORMAL HIGH (ref 12.0–46.0)
Lymphs Abs: 6.8 10*3/uL — ABNORMAL HIGH (ref 0.7–4.0)
MCHC: 33.2 g/dL (ref 30.0–36.0)
MCV: 92.5 fl (ref 78.0–100.0)
MONO ABS: 0.6 10*3/uL (ref 0.1–1.0)
Monocytes Relative: 4.9 % (ref 3.0–12.0)
NEUTROS ABS: 5.3 10*3/uL (ref 1.4–7.7)
Neutrophils Relative %: 41.1 % — ABNORMAL LOW (ref 43.0–77.0)
PLATELETS: 262 10*3/uL (ref 150.0–400.0)
RBC: 4.33 Mil/uL (ref 4.22–5.81)
RDW: 13.5 % (ref 11.5–15.5)
WBC: 13 10*3/uL — ABNORMAL HIGH (ref 4.0–10.5)

## 2016-06-30 LAB — POCT URINALYSIS DIPSTICK
Bilirubin, UA: NEGATIVE
GLUCOSE UA: NEGATIVE
Ketones, UA: NEGATIVE
Leukocytes, UA: NEGATIVE
Nitrite, UA: NEGATIVE
Protein, UA: NEGATIVE
RBC UA: NEGATIVE
SPEC GRAV UA: 1.015 (ref 1.010–1.025)
UROBILINOGEN UA: 0.2 U/dL
pH, UA: 6 (ref 5.0–8.0)

## 2016-06-30 LAB — BASIC METABOLIC PANEL
BUN: 11 mg/dL (ref 6–23)
CALCIUM: 9.2 mg/dL (ref 8.4–10.5)
CO2: 31 meq/L (ref 19–32)
Chloride: 104 mEq/L (ref 96–112)
Creatinine, Ser: 0.95 mg/dL (ref 0.40–1.50)
GFR: 103.85 mL/min (ref >60.00)
GLUCOSE: 95 mg/dL (ref 70–99)
POTASSIUM: 4.6 meq/L (ref 3.5–5.1)
SODIUM: 140 meq/L (ref 135–145)

## 2016-06-30 LAB — HEMOGLOBIN A1C: HEMOGLOBIN A1C: 10.3 % — AB (ref 4.6–6.5)

## 2016-06-30 LAB — HEPATIC FUNCTION PANEL
ALK PHOS: 87 U/L (ref 39–117)
ALT: 11 U/L (ref 0–53)
AST: 15 U/L (ref 0–37)
Albumin: 4.6 g/dL (ref 3.5–5.2)
Bilirubin, Direct: 0.1 mg/dL (ref 0.0–0.3)
TOTAL PROTEIN: 6.9 g/dL (ref 6.0–8.3)
Total Bilirubin: 0.8 mg/dL (ref 0.2–1.2)

## 2016-06-30 LAB — LIPID PANEL
CHOL/HDL RATIO: 4
Cholesterol: 183 mg/dL (ref 0–200)
HDL: 51.6 mg/dL (ref >39.00)
LDL Cholesterol: 121 mg/dL — ABNORMAL HIGH (ref 0–99)
NonHDL: 131.74
TRIGLYCERIDES: 52 mg/dL (ref 0.0–149.0)
VLDL: 10.4 mg/dL (ref 0.0–40.0)

## 2016-06-30 LAB — PSA: PSA: 1.47 ng/mL (ref 0.10–4.00)

## 2016-06-30 LAB — TSH: TSH: 1.31 u[IU]/mL (ref 0.35–4.50)

## 2016-06-30 MED ORDER — LISINOPRIL 40 MG PO TABS: 40.0000 mg | ORAL_TABLET | Freq: Every day | ORAL | 4 refills | Status: DC

## 2016-06-30 MED ORDER — SIMVASTATIN 20 MG PO TABS: ORAL_TABLET | ORAL | 4 refills | Status: DC

## 2016-06-30 MED ORDER — SILDENAFIL CITRATE 20 MG PO TABS: ORAL_TABLET | ORAL | 11 refills | Status: DC

## 2016-06-30 MED ORDER — INSULIN GLARGINE 100 UNIT/ML SOLOSTAR PEN: PEN_INJECTOR | SUBCUTANEOUS | 11 refills | Status: DC

## 2016-06-30 NOTE — Progress Notes (Signed)
Tyler Scott is a 61 year old married male nonsmoker type I diabetic with underlying hypertension and hyperlipidemia who comes in today for annual physical exam.  He's on Lantus 20 units daily . His last A1c hre in Marh20  .5.  He takes lisinopril 40 mg daily for blood pressure control. BP at goal 126/70  He takes Zocor 20 mg an aspirin tablet 3 times a week.  He uses Levitra when necessary for ED  He is due to see Dr. Zigmund Daniel in June for an eye exam, he gets regular dental care, colonoscopy 2016 was normal  Vaccinations up-to-date except he is doing Pneumovax and shingles vaccine.  No risk factors for hepatitis C. New family history finding his brother was recently diagnosed with prostate cancer  14 point review of systems June otherwise negative  EKG was done because a history of above EKG was normal and unchanged  Social history he is retired from [employer] this past December. He's not exercising on a daily basis  BP 126/70   Temp 97.7 F (36.5 C) (Oral)   Ht 5' 8.5" (1.74 m)   Wt 159 lb (72.1 kg)   BMI 23.82 kg/m  Examination of the HEENT were negative except for pinpoint pupils. Neck was supple thyroid is not enlarged no carotid bruits cardiopulmonary exam normal abdominal exam normal genitalia normal circumcised male rectum normal stool guaiac-negative prostate normal extremities normal skin no peripheral pulses normal  #1 type 1 diabetes........ check labs  #2 hypertension at goal........ continue current medicine check labs  #3 hyperlipidemia.............. taking Zocor and aspirin Monday Wednesday Friday........ check labs to see if this is adequate  Number for ED.... Generic Viagra

## 2016-06-30 NOTE — Patient Instructions (Signed)
Continue current medications  Walk 30 minutes daily  Yearly eye exam and dental checks as outlined  We will call you the results of your labs so we need to change any of your meds or schedule follow-up we would do that at that time.

## 2016-07-01 LAB — MICROALBUMIN / CREATININE URINE RATIO
Creatinine,U: 103 mg/dL
MICROALB/CREAT RATIO: 0.7 mg/g (ref 0.0–30.0)
Microalb, Ur: 0.7 mg/dL (ref 0.0–1.9)

## 2016-07-02 ENCOUNTER — Other Ambulatory Visit: Payer: Self-pay | Admitting: Family Medicine

## 2016-07-02 DIAGNOSIS — E1040 Type 1 diabetes mellitus with diabetic neuropathy, unspecified: Secondary | ICD-10-CM

## 2016-07-24 ENCOUNTER — Ambulatory Visit (INDEPENDENT_AMBULATORY_CARE_PROVIDER_SITE_OTHER): Payer: Commercial Managed Care - HMO | Admitting: Ophthalmology

## 2016-07-24 DIAGNOSIS — E11319 Type 2 diabetes mellitus with unspecified diabetic retinopathy without macular edema: Secondary | ICD-10-CM

## 2016-07-24 DIAGNOSIS — H35033 Hypertensive retinopathy, bilateral: Secondary | ICD-10-CM

## 2016-07-24 DIAGNOSIS — I10 Essential (primary) hypertension: Secondary | ICD-10-CM | POA: Diagnosis not present

## 2016-07-24 DIAGNOSIS — H43813 Vitreous degeneration, bilateral: Secondary | ICD-10-CM

## 2016-07-24 DIAGNOSIS — E113593 Type 2 diabetes mellitus with proliferative diabetic retinopathy without macular edema, bilateral: Secondary | ICD-10-CM

## 2016-09-01 ENCOUNTER — Telehealth: Payer: Self-pay

## 2016-09-01 NOTE — Telephone Encounter (Signed)
Called patient and left a voicemail message as he is due his second Shingrix vaccination and is currently not on the schedule to receive it. Please schedule patient when he returns call for a nurse visit.

## 2016-09-07 ENCOUNTER — Ambulatory Visit: Payer: Commercial Managed Care - HMO | Admitting: Internal Medicine

## 2016-09-14 ENCOUNTER — Ambulatory Visit (INDEPENDENT_AMBULATORY_CARE_PROVIDER_SITE_OTHER): Payer: 59 | Admitting: Internal Medicine

## 2016-09-14 ENCOUNTER — Encounter: Payer: Self-pay | Admitting: Internal Medicine

## 2016-09-14 VITALS — BP 132/82 | HR 91 | Ht 68.5 in | Wt 159.6 lb

## 2016-09-14 DIAGNOSIS — E11319 Type 2 diabetes mellitus with unspecified diabetic retinopathy without macular edema: Secondary | ICD-10-CM | POA: Diagnosis not present

## 2016-09-14 DIAGNOSIS — Z794 Long term (current) use of insulin: Secondary | ICD-10-CM | POA: Diagnosis not present

## 2016-09-14 MED ORDER — SITAGLIPTIN PHOSPHATE 100 MG PO TABS: 100.0000 mg | ORAL_TABLET | Freq: Every day | ORAL | 11 refills | Status: DC

## 2016-09-14 NOTE — Patient Instructions (Addendum)
Please continue: - Lantus 20 units at bedtime  Please start: - Januvia 100 mg daily, 15 min before b'fast  Please let me know if the sugars are consistently <80 or >200.  Please return in 1.5 months with your sugar log.   PATIENT INSTRUCTIONS FOR TYPE 2 DIABETES:  **Please join MyChart!** - see attached instructions about how to join if you have not done so already.  DIET AND EXERCISE Diet and exercise is an important part of diabetic treatment.  We recommended aerobic exercise in the form of brisk walking (working between 40-60% of maximal aerobic capacity, similar to brisk walking) for 150 minutes per week (such as 30 minutes five days per week) along with 3 times per week performing 'resistance' training (using various gauge rubber tubes with handles) 5-10 exercises involving the major muscle groups (upper body, lower body and core) performing 10-15 repetitions (or near fatigue) each exercise. Start at half the above goal but build slowly to reach the above goals. If limited by weight, joint pain, or disability, we recommend daily walking in a swimming pool with water up to waist to reduce pressure from joints while allow for adequate exercise.    BLOOD GLUCOSES Monitoring your blood glucoses is important for continued management of your diabetes. Please check your blood glucoses 2-4 times a day: fasting, before meals and at bedtime (you can rotate these measurements - e.g. one day check before the 3 meals, the next day check before 2 of the meals and before bedtime, etc.).   HYPOGLYCEMIA (low blood sugar) Hypoglycemia is usually a reaction to not eating, exercising, or taking too much insulin/ other diabetes drugs.  Symptoms include tremors, sweating, hunger, confusion, headache, etc. Treat IMMEDIATELY with 15 grams of Carbs: . 4 glucose tablets .  cup regular juice/soda . 2 tablespoons raisins . 4 teaspoons sugar . 1 tablespoon honey Recheck blood glucose in 15 mins and repeat  above if still symptomatic/blood glucose <100.  RECOMMENDATIONS TO REDUCE YOUR RISK OF DIABETIC COMPLICATIONS: * Take your prescribed MEDICATION(S) * Follow a DIABETIC diet: Complex carbs, fiber rich foods, (monounsaturated and polyunsaturated) fats * AVOID saturated/trans fats, high fat foods, >2,300 mg salt per day. * EXERCISE at least 5 times a week for 30 minutes or preferably daily.  * DO NOT SMOKE OR DRINK more than 1 drink a day. * Check your FEET every day. Do not wear tightfitting shoes. Contact us if you develop an ulcer * See your EYE doctor once a year or more if needed * Get a FLU shot once a year * Get a PNEUMONIA vaccine once before and once after age 71 years  GOALS:  * Your Hemoglobin A1c of <7%  * fasting sugars need to be <130 * after meals sugars need to be <180 (2h after you start eating) * Your Systolic BP should be 195 or lower  * Your Diastolic BP should be 80 or lower  * Your HDL (Good Cholesterol) should be 40 or higher  * Your LDL (Bad Cholesterol) should be 100 or lower. * Your Triglycerides should be 150 or lower  * Your Urine microalbumin (kidney function) should be <30 * Your Body Mass Index should be 25 or lower    Please consider the following ways to cut down carbs and fat and increase fiber and micronutrients in your diet: - substitute whole grain for white bread or pasta - substitute brown rice for white rice - substitute 90-calorie flat bread pieces for slices of bread  when possible - substitute sweet potatoes or yams for white potatoes - substitute humus for margarine - substitute tofu for cheese when possible - substitute almond or rice milk for regular milk (would not drink soy milk daily due to concern for soy estrogen influence on breast cancer risk) - substitute dark chocolate for other sweets when possible - substitute water - can add lemon or orange slices for taste - for diet sodas (artificial sweeteners will trick your body that you  can eat sweets without getting calories and will lead you to overeating and weight gain in the long run) - do not skip breakfast or other meals (this will slow down the metabolism and will result in more weight gain over time)  - can try smoothies made from fruit and almond/rice milk in am instead of regular breakfast - can also try old-fashioned (not instant) oatmeal made with almond/rice milk in am - order the dressing on the side when eating salad at a restaurant (pour less than half of the dressing on the salad) - eat as little meat as possible - can try juicing, but should not forget that juicing will get rid of the fiber, so would alternate with eating raw veg./fruits or drinking smoothies - use as little oil as possible, even when using olive oil - can dress a salad with a mix of balsamic vinegar and lemon juice, for e.g. - use agave nectar, stevia sugar, or regular sugar rather than artificial sweateners - steam or broil/roast veggies  - snack on veggies/fruit/nuts (unsalted, preferably) when possible, rather than processed foods - reduce or eliminate aspartame in diet (it is in diet sodas, chewing gum, etc) Read the labels!  Try to read Dr. Janene Harvey book: "Program for Reversing Diabetes" for other ideas for healthy eating.

## 2016-09-14 NOTE — Progress Notes (Signed)
Patient ID: Tyler Scott, male   DOB: June 08, 1955, 61 y.o.   MRN: [id number]   HPI: Tyler Scott is a 61 y.o.-year-old male, referred by his PCP, Dr. Sherren Mocha, for management of DM2, dx in 2010, insulin-dependent since 2011, uncontrolled, with complications (+ DR, ED).  He has a h/o CLL.  Last hemoglobin A1c was: Lab Results  Component Value Date   HGBA1C 10.3 (H) 06/30/2016   HGBA1C 8.0 (H) 04/26/2014   HGBA1C 7.8 (H) 07/26/2013   Pt is on a regimen of: - Lantus 20 units at bedtime He tried Metformin >> diarrhea. He was on Glipizide.  Pt checks his sugars 1-3x a day and they are: - am: 135-189 - 2h after b'fast: n/c - before lunch: n/c - 2h after lunch: 170-180s - before dinner: 170-180s - 2h after dinner: n/c - bedtime: n/c - nighttime: n/c No lows. Lowest sugar was 30s (2 mo ago) - may have injected Lantus 2x >> 75; he has hypoglycemia awareness at 70.  Highest sugar was 200s.  Glucometer: One Touch Ultra 2  Pt's meals are: - Breakfast: 2 eggs/turkey sausage/1 toast/coffee - Lunch: fish/chicken salad - Dinner: fish/chicken salad - Snacks: 1-2 a day: pretzels, popcorn >> now less snacks  - no CKD, last BUN/creatinine:  Lab Results  Component Value Date   BUN 11 06/30/2016   BUN 20.7 01/08/2016   CREATININE 0.95 06/30/2016   CREATININE 1.2 01/08/2016  On Lisinopril. - last set of lipids: Lab Results  Component Value Date   CHOL 183 06/30/2016   HDL 51.60 06/30/2016   LDLCALC 121 (H) 06/30/2016   TRIG 52.0 06/30/2016   CHOLHDL 4 06/30/2016  On Simvastatin. On ASA 81. - last eye exam was 07/2016 >> Dr Zigmund Daniel. + DR. + IO injections. - no numbness and tingling in his feet.  Pt has FH of DM in mother.  ROS: Constitutional: no weight gain/loss, no fatigue, no subjective hyperthermia/hypothermia Eyes: no blurry vision, no xerophthalmia ENT: no sore throat, no nodules palpated in throat, no dysphagia/odynophagia, no hoarseness Cardiovascular: no  CP/SOB/palpitations/leg swelling Respiratory: no cough/SOB Gastrointestinal: no N/V/D/C Musculoskeletal: no muscle/joint aches Skin: no rashes Neurological: no tremors/numbness/tingling/dizziness Psychiatric: no depression/anxiety  Past Medical History:  Diagnosis Date  . Allergy   . Arthritis   . CLL (chronic lymphocytic leukemia) ([place])    stage III: asymptomatic; on observation  . DIABETES MELLITUS, TYPE I, UNCONTROLLED 12/30/2006   insulin  . Diabetic retinopathy   . ERECTILE DYSFUNCTION, ORGANIC 12/30/2006  . Hx of adenomatous polyp of colon 05/24/2014  . HYPERLIPIDEMIA 12/30/2006  . HYPERTENSION 12/30/2006  . LOW BACK PAIN, ACUTE 12/21/2006   Past Surgical History:  Procedure Laterality Date  . COLONOSCOPY    . great right toe- Fused the I P joint     Social History   Social History  . Marital status: Married    Spouse name: N/A  . Number of children: no   Occupational History  . Office manager   Social History Main Topics  . Smoking status: Never Smoker  . Smokeless tobacco: Never Used  . Alcohol use 2.4 oz/week    4 Glasses of wine per week  . Drug use: No  . Sexual activity: Not on file     Comment: regular exercise - yes - cardio 2x a week   Current Outpatient Prescriptions on File Prior to Visit  Medication Sig Dispense Refill  . aspirin 81 MG tablet Take 81 mg by mouth daily.    Marland Kitchen  Blood Glucose Monitoring Suppl (ONE TOUCH ULTRA 2) W/DEVICE KIT See admin instructions.  0  . Insulin Glargine (LANTUS SOLOSTAR) 100 UNIT/ML Solostar Pen INJECT 20 UNITS SUBCUTANEOUSLY AT BEDTIME 3 pen 11  . LEVITRA 20 MG tablet TAKE 1 TABLET EVERY DAY AS NEEDED. NEEDS OFFICE VISIT 6 tablet 0  . lisinopril (PRINIVIL,ZESTRIL) 40 MG tablet Take 1 tablet (40 mg total) by mouth daily. 90 tablet 4  . meloxicam (MOBIC) 7.5 MG tablet TAKE 1 TABLET (7.5 MG TOTAL) BY MOUTH DAILY. 30 tablet 3  . sildenafil (REVATIO) 20 MG tablet Take 1 to 2 tabs 2 - 3 hours before sex 30 tablet 11   . simvastatin (ZOCOR) 20 MG tablet TAKE 1 TABLET (20 MG TOTAL) BY MOUTH AT BEDTIME. 100 tablet 4   No current facility-administered medications on file prior to visit.    No Known Allergies Family History  Problem Relation Age of Onset  . Alcohol abuse Other   . Colon cancer Brother 12       Died October 04, 2013  . Diabetes Mellitus II Mother   . Esophageal cancer Neg Hx   . Rectal cancer Neg Hx   . Stomach cancer Neg Hx     PE: BP 132/82   Pulse 91   Ht 5' 8.5" (1.74 m)   Wt 159 lb 9.6 oz (72.4 kg)   SpO2 98%   BMI 23.91 kg/m  Wt Readings from Last 3 Encounters:  09/14/16 159 lb 9.6 oz (72.4 kg)  06/30/16 159 lb (72.1 kg)  01/08/16 160 lb (72.6 kg)   Constitutional: normal weight, in NAD Eyes: PERRLA, EOMI, no exophthalmos ENT: moist mucous membranes, no thyromegaly, no cervical lymphadenopathy Cardiovascular: RRR, No MRG Respiratory: CTA B Gastrointestinal: abdomen soft, NT, ND, BS+ Musculoskeletal: no deformities, strength intact in all 4 Skin: moist, warm, no rashes Neurological: no tremor with outstretched hands, DTR normal in all 4  ASSESSMENT: 1. DM2, insulin-dependent, uncontrolled, with complications - DR - ED  PLAN:  1. Patient with long-standing, uncontrolled diabetes, on basal insulin regimen, which became insufficient. His sugars are high in am and before dinner, so we discussed that he needs more mealtime coverage >> will start Januvia in am.  - At next visit, we have to check him for DM1 - HbA1c today lower, at 8.1%. - I suggested to:  Patient Instructions  Please continue: - Lantus 20 units at bedtime  Please start: - Januvia 100 mg daily, 15 min before b'fast  Please let me know if the sugars are consistently <80 or >200.  Please return in 1.5 months with your sugar log.   - Strongly advised him to start checking sugars at different times of the day - check 1-2 times a day, rotating checks - given sugar log and advised how to fill it and to bring  it at next appt  - given foot care handout and explained the principles  - given instructions for hypoglycemia management "15-15 rule"  - advised for yearly eye exams  - Return to clinic in 1.5 mo with sugar log   Philemon Kingdom, MD PhD [facility]

## 2016-10-07 ENCOUNTER — Other Ambulatory Visit: Payer: Self-pay | Admitting: Family Medicine

## 2016-10-07 ENCOUNTER — Other Ambulatory Visit: Payer: Self-pay | Admitting: Adult Health

## 2016-10-26 ENCOUNTER — Ambulatory Visit: Payer: 59 | Admitting: Internal Medicine

## 2016-12-11 ENCOUNTER — Ambulatory Visit: Payer: 59 | Admitting: Internal Medicine

## 2017-01-01 ENCOUNTER — Ambulatory Visit (HOSPITAL_BASED_OUTPATIENT_CLINIC_OR_DEPARTMENT_OTHER): Payer: 59 | Admitting: Oncology

## 2017-01-01 ENCOUNTER — Other Ambulatory Visit (HOSPITAL_BASED_OUTPATIENT_CLINIC_OR_DEPARTMENT_OTHER): Payer: 59

## 2017-01-01 VITALS — BP 135/73 | HR 77 | Temp 97.8°F | Resp 18 | Ht 68.5 in | Wt 162.7 lb

## 2017-01-01 DIAGNOSIS — C8581 Other specified types of non-Hodgkin lymphoma, lymph nodes of head, face, and neck: Secondary | ICD-10-CM | POA: Diagnosis not present

## 2017-01-01 DIAGNOSIS — E119 Type 2 diabetes mellitus without complications: Secondary | ICD-10-CM | POA: Diagnosis not present

## 2017-01-01 DIAGNOSIS — C9110 Chronic lymphocytic leukemia of B-cell type not having achieved remission: Secondary | ICD-10-CM

## 2017-01-01 DIAGNOSIS — R590 Localized enlarged lymph nodes: Secondary | ICD-10-CM

## 2017-01-01 LAB — COMPREHENSIVE METABOLIC PANEL
ALBUMIN: 4.2 g/dL (ref 3.5–5.0)
ALK PHOS: 68 U/L (ref 40–150)
ALT: 19 U/L (ref 0–55)
AST: 22 U/L (ref 5–34)
Anion Gap: 8 mEq/L (ref 3–11)
BUN: 15.7 mg/dL (ref 7.0–26.0)
CO2: 26 mEq/L (ref 22–29)
CREATININE: 0.9 mg/dL (ref 0.7–1.3)
Calcium: 9.1 mg/dL (ref 8.4–10.4)
Chloride: 108 mEq/L (ref 98–109)
EGFR: >60 mL/min/{1.73_m2} (ref >60)
GLUCOSE: 89 mg/dL (ref 70–140)
Potassium: 4.4 mEq/L (ref 3.5–5.1)
SODIUM: 142 meq/L (ref 136–145)
Total Bilirubin: 0.75 mg/dL (ref 0.20–1.20)
Total Protein: 7 g/dL (ref 6.4–8.3)

## 2017-01-01 LAB — CBC WITH DIFFERENTIAL/PLATELET
BASO%: 0.1 % (ref 0.0–2.0)
Basophils Absolute: 0 10*3/uL (ref 0.0–0.1)
EOS%: 1.2 % (ref 0.0–7.0)
Eosinophils Absolute: 0.4 10*3/uL (ref 0.0–0.5)
HCT: 38.5 % (ref 38.4–49.9)
HEMOGLOBIN: 12.8 g/dL — AB (ref 13.0–17.1)
LYMPH%: 84.5 % — AB (ref 14.0–49.0)
MCH: 31.4 pg (ref 27.2–33.4)
MCHC: 33.2 g/dL (ref 32.0–36.0)
MCV: 94.7 fL (ref 79.3–98.0)
MONO#: 0.9 10*3/uL (ref 0.1–0.9)
MONO%: 2.4 % (ref 0.0–14.0)
NEUT#: 4.5 10*3/uL (ref 1.5–6.5)
NEUT%: 11.8 % — ABNORMAL LOW (ref 39.0–75.0)
Platelets: 224 10*3/uL (ref 140–400)
RBC: 4.06 10*6/uL — AB (ref 4.20–5.82)
RDW: 13.3 % (ref 11.0–14.6)
WBC: 38.3 10*3/uL — AB (ref 4.0–10.3)
lymph#: 32.4 10*3/uL — ABNORMAL HIGH (ref 0.9–3.3)

## 2017-01-01 LAB — TECHNOLOGIST REVIEW

## 2017-01-01 LAB — LACTATE DEHYDROGENASE: LDH: 157 U/L (ref 125–245)

## 2017-01-01 NOTE — Progress Notes (Signed)
[name] OFFICE PROGRESS NOTE 01/01/17  [name], MD [address] [place] [address]  DIAGNOSIS: 61 year old with small lymphocytic lymphoma in 2012. He presented with leukocytosis and diffuse lymphadenopathy. He has stage III disease upon presentation.   Prior therapy: Bendamustine and rituximab started in July 2017 and received total of 2 cycles. Therapy discontinued in August 2017 based on patient's wishes.   CURRENT THERAPY: Continued observation and surveillance.  INTERVAL HISTORY: Tyler Scott presents today for a follow-up visit. Since his last visit, he reports no changes in his health.  He has remained active and continues to attend activities of daily living.  He continues to eat well and have gained weight since the last visit.  He denies any painful adenopathy.  He denies any abdominal pain or early satiety.  He denies any fevers or chills.  He does report periodic sweats.  He continues to enjoy excellent quality of life.  He does not report any headaches, blurry vision, syncope or seizures. He does not report any cough, wheezing or hemoptysis. He does not report any chest pain or palpitation. He does not report any nausea, vomiting, abdominal pain. Does not report any genitourinary complaints. Does not report any skeletal complaints. Rest of review of systems unremarkable.  MEDICAL HISTORY: Past Medical History:  Diagnosis Date  . Allergy   . Arthritis   . CLL (chronic lymphocytic leukemia) ([place])    stage III: asymptomatic; on observation  . DIABETES MELLITUS, TYPE I, UNCONTROLLED 12/30/2006   insulin  . Diabetic retinopathy   . ERECTILE DYSFUNCTION, ORGANIC 12/30/2006  . Hx of adenomatous polyp of colon 05/24/2014  . HYPERLIPIDEMIA 12/30/2006  . HYPERTENSION 12/30/2006  . LOW BACK PAIN, ACUTE 12/21/2006    ALLERGIES:  has No Known Allergies.  MEDICATIONS: has a current medication list which includes the following  prescription(s): one touch ultra 2, insulin glargine, lisinopril, meloxicam, one touch ultra test, onetouch delica lancets 94T, sildenafil, simvastatin, and sitagliptin.   PHYSICAL EXAMINATION: ECOG PERFORMANCE STATUS: 0  Blood pressure 135/73, pulse 77, temperature 97.8 F (36.6 C), temperature source Oral, resp. rate 18, height 5' 8.5" (1.74 m), weight 162 lb 11.2 oz (73.8 kg), SpO2 100 %. ECOG 0 GENERAL: Alert, awake gentleman without distress. SKIN: No ecchymosis or petechiae. EYES: Sclera anicteric. OROPHARYNX: No thrush noted. NECK: Cervical adenopathy palpated more left than right.  Small in size. LYMPH: No axillary or supraclavicular adenopathy noted. LUNGS: clear to auscultation without rhonchi or wheezes. HEART: regular rate & rhythm and no murmurs  ABDOMEN:abdomen soft, non-tender and normal bowel sounds.  No splenomegaly palpated. Musculoskeletal:no cyanosis of digits and no clubbing  NEURO: No deficits noted.  Labs:  Lab Results  Component Value Date   WBC 38.3 (H) 01/01/2017   HGB 12.8 (L) 01/01/2017   HCT 38.5 01/01/2017   MCV 94.7 01/01/2017   PLT 224 01/01/2017   NEUTROABS 4.5 01/01/2017      Chemistry      Component Value Date/Time   NA 140 06/30/2016 1509   NA 142 01/08/2016 0852   K 4.6 06/30/2016 1509   K 4.1 01/08/2016 0852   CL 104 06/30/2016 1509   CL 102 04/25/2012 1003   CO2 31 06/30/2016 1509   CO2 25 01/08/2016 0852   BUN 11 06/30/2016 1509   BUN 20.7 01/08/2016 0852   CREATININE 0.95 06/30/2016 1509   CREATININE 1.2 01/08/2016 0852      Component Value Date/Time   CALCIUM 9.2 06/30/2016 1509  CALCIUM 9.5 01/08/2016 0852   ALKPHOS 87 06/30/2016 1509   ALKPHOS 101 01/08/2016 0852   AST 15 06/30/2016 1509   AST 16 01/08/2016 0852   ALT 11 06/30/2016 1509   ALT 11 01/08/2016 0852   BILITOT 0.8 06/30/2016 1509   BILITOT 0.84 01/08/2016 0852        ASSESSMENT: 61 year old gentleman with the following issues   1. Small  lymphocytic lymphoma, stage III with diffuse adenopathy and splenomegaly diagnosed in 2012. He was under active surveillance since his diagnosis but in 2017 developed symptomatic cervical adenopathy.  CT scan obtained on 08/05/2015 showed progressive lymphadenopathy. He does have diffuse and bulky adenopathy that warrants treatment at this time.  He Is status post 2 cycles of bendamustine and rituximab with few complications. He had excellent response with resolution of those adenopathy. He expressed interest in discontinuation of chemotherapy.  His physical examination today reveals slight increase in his cervical adenopathy and her white cell count is elevated.  The plan is to repeat staging workup including a CT scan of the neck, chest, abdomen and pelvis.  I discussed with him the role of restarting systemic therapy at this time whether would be in the form of chemoimmunotherapy with bendamustine and rituximab versus oral targeted therapy.  He is agreeable to proceed with staging workup and we will discussed risks and benefits of restarting therapy after obtaining imaging studies.  He prefers to have that done in January given his upcoming travel.  It is reasonable to do so given the fact that he is completely asymptomatic at this time.    2. DM2 on insulin: Management per his PCP. No major changes in his blood sugar.  3. Follow-up: Will be in January 2019 after repeat imaging studies.    [name], MD 01/01/17 8:40 AM

## 2017-01-11 ENCOUNTER — Ambulatory Visit (INDEPENDENT_AMBULATORY_CARE_PROVIDER_SITE_OTHER): Payer: Commercial Managed Care - HMO | Admitting: Ophthalmology

## 2017-01-11 DIAGNOSIS — E113513 Type 2 diabetes mellitus with proliferative diabetic retinopathy with macular edema, bilateral: Secondary | ICD-10-CM

## 2017-01-11 DIAGNOSIS — I10 Essential (primary) hypertension: Secondary | ICD-10-CM

## 2017-01-11 DIAGNOSIS — H2513 Age-related nuclear cataract, bilateral: Secondary | ICD-10-CM

## 2017-01-11 DIAGNOSIS — H35033 Hypertensive retinopathy, bilateral: Secondary | ICD-10-CM

## 2017-01-11 DIAGNOSIS — E11319 Type 2 diabetes mellitus with unspecified diabetic retinopathy without macular edema: Secondary | ICD-10-CM

## 2017-01-11 DIAGNOSIS — H43813 Vitreous degeneration, bilateral: Secondary | ICD-10-CM | POA: Diagnosis not present

## 2017-01-13 ENCOUNTER — Telehealth: Payer: Self-pay | Admitting: Oncology

## 2017-01-13 NOTE — Telephone Encounter (Signed)
Spoke with patient re appt. Schedule mailed °

## 2017-01-21 ENCOUNTER — Ambulatory Visit: Payer: 59 | Admitting: Internal Medicine

## 2017-02-10 ENCOUNTER — Telehealth: Payer: Self-pay | Admitting: Oncology

## 2017-02-10 NOTE — Telephone Encounter (Signed)
Scheduled appt per 12/31 sch msg - left voicemail for patient regarding appts.

## 2017-02-15 ENCOUNTER — Other Ambulatory Visit: Payer: Self-pay | Admitting: *Deleted

## 2017-02-15 DIAGNOSIS — Z86010 Personal history of colonic polyps: Secondary | ICD-10-CM

## 2017-02-16 ENCOUNTER — Inpatient Hospital Stay: Payer: 59 | Attending: Oncology

## 2017-02-16 ENCOUNTER — Ambulatory Visit (HOSPITAL_COMMUNITY)
Admission: RE | Admit: 2017-02-16 | Discharge: 2017-02-16 | Disposition: A | Discharge status: Home / Self Care | Payer: 59 | Source: Ambulatory Visit | Attending: Oncology | Admitting: Oncology

## 2017-02-16 ENCOUNTER — Encounter (HOSPITAL_COMMUNITY): Payer: Self-pay

## 2017-02-16 DIAGNOSIS — Z794 Long term (current) use of insulin: Secondary | ICD-10-CM | POA: Diagnosis not present

## 2017-02-16 DIAGNOSIS — Z8572 Personal history of non-Hodgkin lymphomas: Secondary | ICD-10-CM | POA: Diagnosis present

## 2017-02-16 DIAGNOSIS — C9190 Lymphoid leukemia, unspecified not having achieved remission: Secondary | ICD-10-CM | POA: Diagnosis not present

## 2017-02-16 DIAGNOSIS — C9110 Chronic lymphocytic leukemia of B-cell type not having achieved remission: Secondary | ICD-10-CM

## 2017-02-16 DIAGNOSIS — Z860101 Personal history of adenomatous and serrated colon polyps: Secondary | ICD-10-CM

## 2017-02-16 DIAGNOSIS — E119 Type 2 diabetes mellitus without complications: Secondary | ICD-10-CM | POA: Insufficient documentation

## 2017-02-16 DIAGNOSIS — Z86010 Personal history of colonic polyps: Secondary | ICD-10-CM

## 2017-02-16 LAB — CMP (CANCER CENTER ONLY)
ALBUMIN: 4.3 g/dL (ref 3.5–5.0)
ALT: 24 U/L (ref 0–55)
ANION GAP: 6 (ref 3–11)
AST: 27 U/L (ref 5–34)
Alkaline Phosphatase: 81 U/L (ref 40–150)
BILIRUBIN TOTAL: 0.7 mg/dL (ref 0.2–1.2)
BUN: 9 mg/dL (ref 7–26)
CO2: 29 mmol/L (ref 22–29)
Calcium: 9.1 mg/dL (ref 8.4–10.4)
Chloride: 105 mmol/L (ref 98–109)
Creatinine: 1.06 mg/dL (ref 0.70–1.30)
GFR, Est AFR Am: >60 mL/min (ref >60)
GFR, Estimated: >60 mL/min (ref >60)
GLUCOSE: 94 mg/dL (ref 70–140)
POTASSIUM: 4.6 mmol/L (ref 3.5–5.1)
Sodium: 140 mmol/L (ref 136–145)
TOTAL PROTEIN: 7 g/dL (ref 6.4–8.3)

## 2017-02-16 MED ORDER — IOPAMIDOL (ISOVUE-300) INJECTION 61%
INTRAVENOUS | Status: AC
  Filled 2017-02-16: qty 75

## 2017-02-16 MED ORDER — IOPAMIDOL (ISOVUE-300) INJECTION 61%
100.0000 mL | Freq: Once | INTRAVENOUS | Status: AC | PRN
  Administered 2017-02-16: 100 mL via INTRAVENOUS

## 2017-02-19 ENCOUNTER — Inpatient Hospital Stay (HOSPITAL_BASED_OUTPATIENT_CLINIC_OR_DEPARTMENT_OTHER): Payer: 59 | Admitting: Oncology

## 2017-02-19 VITALS — BP 150/73 | HR 83 | Temp 97.6°F | Resp 18 | Ht 68.5 in | Wt 159.5 lb

## 2017-02-19 DIAGNOSIS — Z8572 Personal history of non-Hodgkin lymphomas: Secondary | ICD-10-CM | POA: Diagnosis not present

## 2017-02-19 DIAGNOSIS — Z794 Long term (current) use of insulin: Secondary | ICD-10-CM

## 2017-02-19 DIAGNOSIS — E119 Type 2 diabetes mellitus without complications: Secondary | ICD-10-CM

## 2017-02-19 DIAGNOSIS — C9110 Chronic lymphocytic leukemia of B-cell type not having achieved remission: Secondary | ICD-10-CM

## 2017-02-19 NOTE — Progress Notes (Signed)
[facility] OFFICE PROGRESS NOTE 02/19/17  Tyler Cookey, MD [address] [place] [address]  DIAGNOSIS: 62 year old with small lymphocytic lymphoma diagnosed in 2012. He presented with leukocytosis and diffuse lymphadenopathy. He has stage III disease upon presentation.   Prior therapy: Bendamustine and rituximab started in July 2017 and received total of 2 cycles. Therapy discontinued in August 2017 based on patient's wishes.   CURRENT THERAPY: Continued observation and surveillance.  INTERVAL HISTORY: Tyler Scott presents is here for a follow-up by himself.  He continues to feel well without any major changes in his health.  He he denied lymphadenopathy or constitutional symptoms.  He denied any fevers or chills.  He does report occasional night sweats. He denies any abdominal pain or early satiety.  He remains active and attends to activities of daily living.  He denied any recent infections or hospitalizations.  He does not report any headaches, blurry vision, syncope or seizures. He does not report any cough, wheezing or hemoptysis. He does not report any chest pain or palpitation. He does not report any nausea, vomiting, abdominal pain. Does not report any genitourinary complaints. Does not report any skeletal complaints.  He does not report any petechiae, ecchymosis or easy bruising.  He denied psychiatric issues, anxiety or depression rest of review of systems is negative.  MEDICAL HISTORY: Past Medical History:  Diagnosis Date  . Allergy   . Arthritis   . CLL (chronic lymphocytic leukemia) ([facility])    stage III: asymptomatic; on observation  . DIABETES MELLITUS, TYPE I, UNCONTROLLED 12/30/2006   insulin  . Diabetic retinopathy   . ERECTILE DYSFUNCTION, ORGANIC 12/30/2006  . Hx of adenomatous polyp of colon 05/24/2014  . HYPERLIPIDEMIA 12/30/2006  . HYPERTENSION 12/30/2006  . LOW BACK PAIN, ACUTE 12/21/2006    ALLERGIES:  has No Known  Allergies.  MEDICATIONS: has a current medication list which includes the following prescription(s): one touch ultra 2, insulin glargine, lisinopril, meloxicam, one touch ultra test, onetouch delica lancets 25K, sildenafil, simvastatin, and sitagliptin.   PHYSICAL EXAMINATION: ECOG PERFORMANCE STATUS: 0  Blood pressure (!) 150/73, pulse 83, temperature 97.6 F (36.4 C), temperature source Oral, resp. rate 18, height 5' 8.5" (1.74 m), weight 159 lb 8 oz (72.3 kg), SpO2 100 %. ECOG 0 GENERAL: Alert, awake gentleman without distress. EYES: Sclera anicteric.  Pupils are equal round reactive to light OROPHARYNX: No thrush noted.  No oral ulcers. LYMPH: No axillary or supraclavicular adenopathy noted.  Cervical lymphadenopathy. LUNGS: clear to auscultation without rhonchi or wheezes. HEART: regular rate & rhythm and no murmurs  ABDOMEN: Soft, nontender with good bowel sounds. Musculoskeletal: No joint effusion or swelling in his lower extremities. NEURO: No deficits noted. Skin: No rashes or lesions.  Labs:  Lab Results  Component Value Date   WBC 38.3 (H) 01/01/2017   HGB 12.8 (L) 01/01/2017   HCT 38.5 01/01/2017   MCV 94.7 01/01/2017   PLT 224 01/01/2017   NEUTROABS 4.5 01/01/2017      Chemistry      Component Value Date/Time   NA 140 02/16/2017 0906   NA 142 01/01/2017 0809   K 4.6 02/16/2017 0906   K 4.4 01/01/2017 0809   CL 105 02/16/2017 0906   CL 102 04/25/2012 1003   CO2 29 02/16/2017 0906   CO2 26 01/01/2017 0809   BUN 9 02/16/2017 0906   BUN 15.7 01/01/2017 0809   CREATININE 0.9 01/01/2017 0809      Component Value Date/Time  CALCIUM 9.1 02/16/2017 0906   CALCIUM 9.1 01/01/2017 0809   ALKPHOS 81 02/16/2017 0906   ALKPHOS 68 01/01/2017 0809   AST 27 02/16/2017 0906   AST 22 01/01/2017 0809   ALT 24 02/16/2017 0906   ALT 19 01/01/2017 0809   BILITOT 0.7 02/16/2017 0906   BILITOT 0.75 01/01/2017 0809     EXAM: CT CHEST, ABDOMEN, AND PELVIS WITH  CONTRAST  TECHNIQUE: Multidetector CT imaging of the chest, abdomen and pelvis was performed following the standard protocol during bolus administration of intravenous contrast.  CONTRAST:  12mL ISOVUE-300 IOPAMIDOL (ISOVUE-300) INJECTION 61%  COMPARISON:  08/05/2015  FINDINGS: CT CHEST FINDINGS  Cardiovascular: Normal heart size. No pericardial effusion. Aortic atherosclerosis. LAD coronary artery calcification noted.  Mediastinum/Nodes: Thoracic adenopathy is again identified. Left supraclavicular lymph node measures 1.7 cm, image number 4 of series 3. Previously 2.2 cm. Index left axillary node measures 1.4 cm, image 14 of series 3. Previously 1.7 cm. Index right axillary node measures 1.1 cm, image 19 of series 3. Previously 1.5 cm. Index right paratracheal node measures 1.6 cm, image number 17 of series 3. Previously 1.6 cm. Subcarinal lymph node measures 1.3 cm, image number 28 of series 3. Previously 1.3 cm.  Lungs/Pleura: No pleural effusion identified. No pleural effusion identified. No airspace consolidation or atelectasis.  Musculoskeletal: No aggressive lytic or sclerotic bone lesions.  CT ABDOMEN PELVIS FINDINGS  Hepatobiliary: No focal liver abnormality is seen. No gallstones, gallbladder wall thickening, or biliary dilatation.  Pancreas: Unremarkable. No pancreatic ductal dilatation or surrounding inflammatory changes.  Spleen: The spleen measures 5.3 by 10.2 by 10.8 cm (volume = 310 cm^3). No focal splenic abnormality.  Adrenals/Urinary Tract: The adrenal glands are normal. Bilateral kidney cysts are identified. No mass or hydronephrosis identified. Urinary bladder appears normal.  Stomach/Bowel: The stomach is normal. The small bowel loops have a normal course and caliber. The appendix is visualized and appears normal. Normal appearance of the colon.  Vascular/Lymphatic: Aortic atherosclerosis. No aneurysm. Abdominal and pelvic  adenopathy is again identified. Aortocaval node measures 1.4 cm, image 79 of series 3. Previously 1.4 cm. Left periaortic node measures 2.5 cm, image 71 of series 3. Previously 2.3 cm. Right common iliac node measures 11 mm, image 93 of series 3. Previously this measured the same. Left pelvic sidewall node measures 1.8 cm, image 104 of series 3. Previously 1.7 cm. Right pelvic sidewall node measures 1.7 cm, image 107 of series 3. Previously 1.8 cm.  Reproductive: Prostate is unremarkable.  Other: No free fluid or fluid collections.  Musculoskeletal: No aggressive lytic or sclerotic bone lesions.  IMPRESSION: 1. Stable to mild improvement in thoracic adenopathy. 2. No significant change in the appearance of abdominal and pelvic adenopathy. 3. Aortic Atherosclerosis (ICD10-I70.0). LAD coronary artery calcification. 4. Bilateral kidney cysts.   ASSESSMENT: 62 year old gentleman with the following issues   1. Small lymphocytic lymphoma, stage III with diffuse adenopathy and splenomegaly diagnosed in 2012.  He developed a symptomatic lymphadenopathy in 2017 and required treatment at the time.  He received chemotherapy with bendamustine and rituximab in July 2017 and therapy discontinued based on his preference.  Staging CT scan done on February 16, 2017 was personally reviewed and discussed with the patient today.  His lymphadenopathy appears stable and slightly decreased since the last examination.  No organ damage is noted and he continues to be asymptomatic.  Risks and benefits of starting therapy for his lymphoma was discussed today. He elected to defer therapy for the time  being given his stable disease and of symptoms.  He understands that he will likely require systemic therapy in the near future and certainly sooner if he develops any symptoms.  2. DM2 on insulin: Blood sugar appears to be under control.  Continues to be managed by his primary care physician.  3.  Follow-up: Will be in 3 months for a follow-up.  15 minutes was spent today face-to-face with the patient.  More than 50% of the time was dedicated to counseling, education and reviewing treatment options and coordination of care.    [name], MD 02/19/17 3:40 PM

## 2017-02-22 ENCOUNTER — Telehealth: Payer: Self-pay | Admitting: Oncology

## 2017-02-22 NOTE — Telephone Encounter (Signed)
Spoke with patient re April appointments.  °

## 2017-04-22 ENCOUNTER — Encounter: Payer: Self-pay | Admitting: *Deleted

## 2017-04-23 ENCOUNTER — Encounter: Payer: Self-pay | Admitting: *Deleted

## 2017-04-23 ENCOUNTER — Telehealth: Payer: Self-pay | Admitting: *Deleted

## 2017-04-23 NOTE — Telephone Encounter (Signed)
Spoke with patient, dr Alen Blew has reviewed patient's labs, faxed from the [facility]and will repeat them 05/20/17 to compare.

## 2017-05-13 ENCOUNTER — Telehealth: Payer: Self-pay | Admitting: *Deleted

## 2017-05-13 NOTE — Telephone Encounter (Signed)
Faxed ROI to [facility], attn: Venancio Poisson Thi-Nguyen; release id # [phone]

## 2017-05-20 ENCOUNTER — Telehealth: Payer: Self-pay | Admitting: Oncology

## 2017-05-20 ENCOUNTER — Inpatient Hospital Stay: Payer: 59

## 2017-05-20 ENCOUNTER — Inpatient Hospital Stay: Payer: 59 | Attending: Oncology | Admitting: Oncology

## 2017-05-20 VITALS — BP 158/77 | HR 75 | Temp 97.8°F | Resp 18 | Ht 68.5 in | Wt 160.3 lb

## 2017-05-20 DIAGNOSIS — C8300 Small cell B-cell lymphoma, unspecified site: Secondary | ICD-10-CM | POA: Diagnosis not present

## 2017-05-20 DIAGNOSIS — E119 Type 2 diabetes mellitus without complications: Secondary | ICD-10-CM | POA: Insufficient documentation

## 2017-05-20 DIAGNOSIS — C9110 Chronic lymphocytic leukemia of B-cell type not having achieved remission: Secondary | ICD-10-CM

## 2017-05-20 LAB — CBC WITH DIFFERENTIAL (CANCER CENTER ONLY)
BASOS PCT: 0 %
Basophils Absolute: 0.2 10*3/uL — ABNORMAL HIGH (ref 0.0–0.1)
EOS ABS: 0.3 10*3/uL (ref 0.0–0.5)
EOS PCT: 0 %
HCT: 38.8 % (ref 38.4–49.9)
Hemoglobin: 12.7 g/dL — ABNORMAL LOW (ref 13.0–17.1)
Lymphocytes Relative: 93 %
Lymphs Abs: 77 10*3/uL — ABNORMAL HIGH (ref 0.9–3.3)
MCH: 31.1 pg (ref 27.2–33.4)
MCHC: 32.7 g/dL (ref 32.0–36.0)
MCV: 95.1 fL (ref 79.3–98.0)
Monocytes Absolute: 1.3 10*3/uL — ABNORMAL HIGH (ref 0.1–0.9)
Monocytes Relative: 2 %
NEUTROS PCT: 5 %
Neutro Abs: 4.3 10*3/uL (ref 1.5–6.5)
PLATELETS: 235 10*3/uL (ref 140–400)
RBC: 4.08 MIL/uL — AB (ref 4.20–5.82)
RDW: 14.3 % (ref 11.0–14.6)
WBC: 83 10*3/uL — AB (ref 4.0–10.3)

## 2017-05-20 LAB — CMP (CANCER CENTER ONLY)
ALBUMIN: 4.3 g/dL (ref 3.5–5.0)
ALT: 23 U/L (ref 0–55)
AST: 30 U/L (ref 5–34)
Alkaline Phosphatase: 75 U/L (ref 40–150)
Anion gap: 10 (ref 3–11)
BUN: 13 mg/dL (ref 7–26)
CHLORIDE: 108 mmol/L (ref 98–109)
CO2: 24 mmol/L (ref 22–29)
CREATININE: 1.07 mg/dL (ref 0.70–1.30)
Calcium: 9.8 mg/dL (ref 8.4–10.4)
GFR, Estimated: >60 mL/min (ref >60)
GLUCOSE: 140 mg/dL (ref 70–140)
Potassium: 4.9 mmol/L (ref 3.5–5.1)
SODIUM: 142 mmol/L (ref 136–145)
Total Bilirubin: 1 mg/dL (ref 0.2–1.2)
Total Protein: 7 g/dL (ref 6.4–8.3)

## 2017-05-20 MED ORDER — IBRUTINIB 420 MG PO TABS: 420.0000 mg | ORAL_TABLET | Freq: Every day | ORAL | 0 refills | Status: DC

## 2017-05-20 NOTE — Telephone Encounter (Signed)
Appointments scheduled AVS/[place] printed per 4/11 los

## 2017-05-20 NOTE — Progress Notes (Signed)
[facility] OFFICE PROGRESS NOTE 05/20/17  Tyler Cookey, MD [address] [place] [address]  DIAGNOSIS: 62 year old with stage III small lymphocytic lymphoma diagnosed in 2012.   Prior therapy: Bendamustine and rituximab started in July 2017 and received total of 2 cycles. Therapy discontinued in August 2017 based on patient's wishes.   CURRENT THERAPY: Active surveillance.  Consideration to start systemic therapy.  INTERVAL HISTORY: Tyler Scott returns today for a follow-up.  He reports no major changes in his health since her last visit.  He remains active and attends to activities of daily living.  He denies any bulky or painful adenopathy, he does report night sweats which has not changed dramatically.  His appetite and performance status is unchanged and his weight is stable.  He denies any recent infections or hospitalizations.  He denies any active bleeding.  He does not report any headaches, blurry vision, syncope or seizures.  He denies any fevers, chills or sweats.  He does not report any chest pain, palpitation orthopnea.  He denies any leg edema.  He does not report any cough, wheezing or hemoptysis. He does not report any nausea, vomiting, abdominal pain. Does not report any frequency urgency or hesitancy.  Does not report any bone pain or pathological fractures.  He does not report any petechiae, ecchymosis or easy bruising.  He denied psychiatric anxiety or depression rest of review of systems is negative.  MEDICAL HISTORY: Past Medical History:  Diagnosis Date  . Allergy   . Arthritis   . CLL (chronic lymphocytic leukemia) ([facility])    stage III: asymptomatic; on observation  . DIABETES MELLITUS, TYPE I, UNCONTROLLED 12/30/2006   insulin  . Diabetic retinopathy   . ERECTILE DYSFUNCTION, ORGANIC 12/30/2006  . Hx of adenomatous polyp of colon 05/24/2014  . HYPERLIPIDEMIA 12/30/2006  . HYPERTENSION 12/30/2006  . LOW BACK PAIN, ACUTE 12/21/2006     ALLERGIES:  has No Known Allergies.  MEDICATIONS: has a current medication list which includes the following prescription(s): one touch ultra 2, insulin glargine, lisinopril, meloxicam, one touch ultra test, onetouch delica lancets 03T, sildenafil, simvastatin, and sitagliptin.   PHYSICAL EXAMINATION: Blood pressure (!) 158/77, pulse 75, temperature 97.8 F (36.6 C), temperature source Oral, resp. rate 18, height 5' 8.5" (1.74 m), weight 160 lb 4.8 oz (72.7 kg), SpO2 100 %.   ECOG PERFORMANCE STATUS: 0  ECOG 0 GENERAL: Well-appearing gentleman appeared comfortable. EYES: Conjunctiva is clear. OROPHARYNX: Mucous membranes are moist and pink. LYMPH: Palpable adenopathy noted in the cervical chains bilaterally. LUNGS: Clear in all lung fields without any wheezes or dullness to percussion. HEART: Regular rate and rhythm without any murmurs rubs or gallops. ABDOMEN: Soft, nontender without any rebound or guarding. Musculoskeletal: No clubbing or cyanosis. NEURO: No motor or sensory deficits. Skin: No ecchymosis or petechiae.  Labs:  Lab Results  Component Value Date   WBC 38.3 (H) 01/01/2017   HGB 12.8 (L) 01/01/2017   HCT 38.5 01/01/2017   MCV 94.7 01/01/2017   PLT 224 01/01/2017   NEUTROABS 4.5 01/01/2017      Chemistry      Component Value Date/Time   NA 140 02/16/2017 0906   NA 142 01/01/2017 0809   K 4.6 02/16/2017 0906   K 4.4 01/01/2017 0809   CL 105 02/16/2017 0906   CL 102 04/25/2012 1003   CO2 29 02/16/2017 0906   CO2 26 01/01/2017 0809   BUN 9 02/16/2017 0906   BUN 15.7 01/01/2017 0809  CREATININE 1.06 02/16/2017 0906   CREATININE 0.9 01/01/2017 0809      Component Value Date/Time   CALCIUM 9.1 02/16/2017 0906   CALCIUM 9.1 01/01/2017 0809   ALKPHOS 81 02/16/2017 0906   ALKPHOS 68 01/01/2017 0809   AST 27 02/16/2017 0906   AST 22 01/01/2017 0809   ALT 24 02/16/2017 0906   ALT 19 01/01/2017 0809   BILITOT 0.7 02/16/2017 0906   BILITOT 0.75  01/01/2017 0809     EIMPRESSION: 1. Stable to mild improvement in thoracic adenopathy. 2. No significant change in the appearance of abdominal and pelvic adenopathy. 3. Aortic Atherosclerosis (ICD10-I70.0). LAD coronary artery calcification. 4. Bilateral kidney cysts.   ASSESSMENT: 62 year old gentleman with the following issues   1.  Stage III Small lymphocytic lymphoma diagnosed in 2012.  He was on active surveillance till 2017 he developed symptomatic lymphadenopathy.  After 2 cycles of bendamustine and rituximab he achieved partial response.  His white cell count continues to increase rather rapidly and he is developing palpable cervical adenopathy.  The natural course of this disease was discussed today in detail in addition to treatment options.  Continued observation and surveillance versus systemic chemotherapy versus Imbruvica are the options at this time.  I feel he will become symptomatic rather quickly and it is reasonable to start treatment.  Side effects associated with Imbruvica were reviewed today these complications include fatigue, pruritus, skin changes, cardiovascular complications, bleeding among others.  He is agreeable to proceed with this treatment and will start with a 420 mg dosing.    2. DM2 on insulin: Blood sugar appears adequate and we will continue to monitor on this current treatment.  3. Follow-up: Will be in 6 weeks to follow his progress.  25 minutes was spent today face-to-face with the patient.  More than 50% of the time was dedicated to counseling, education and reviewing treatment options and coordination of care.    Tyler Button, MD 05/20/17 8:04 AM

## 2017-05-25 ENCOUNTER — Telehealth: Payer: Self-pay | Admitting: Pharmacist

## 2017-05-25 DIAGNOSIS — C9110 Chronic lymphocytic leukemia of B-cell type not having achieved remission: Secondary | ICD-10-CM

## 2017-05-25 MED ORDER — IBRUTINIB 420 MG PO TABS: 420.0000 mg | ORAL_TABLET | Freq: Every day | ORAL | 0 refills | Status: DC

## 2017-05-25 NOTE — Telephone Encounter (Signed)
Oral Oncology Pharmacist Encounter  Received new prescription for Imbruvica (ibrutinib) for the treatment of progressive small lymphocytic leukemia, planned duration until disease progression or unacceptable toxicity.  Labs from 05/20/2017 assessed, okay for treatment. BPs in epic reviewed, above normal limits, noted patient on one agent for hypertension.   Will discuss risk of hypertension with the use of Imbruvica with patient.  Current medication list in Epic reviewed, DDI with Imbruvica and meloxicam identified:  Category C interaction: Ibrutinib may enhance adverse/toxic effective agents with antiplatelet properties.  In patients on therapy with Imbruvica.  Bleeding events of any grade, including bruising and petechiae have occurred and it was half of patients receiving Imbruvica.  Patient will be counseled about risk of bruising and bleeding and be monitored closely.  Prescription has been e-scribed to the [facility] for benefits analysis and approval. Test claim at the pharmacy revealed that insurance authorization was not required. Copayment on patient's insurance is $18 per 28 days supply. We will discuss with patient ability to obtain copayment card to decrease monthly expense to $10 out-of-pocket for patient.  Oral Oncology Clinic will continue to follow for copayment issues, initial counseling and start date.  Johny Drilling, PharmD, BCPS, BCOP 05/25/2017 11:21 AM Oral Oncology Clinic [phone]

## 2017-05-25 NOTE — Telephone Encounter (Signed)
Oral Chemotherapy Pharmacist Encounter   Attempted to reach patient to provide update and offer for initial counseling on oral medication: Imbruvica.  No answer. Left VM for patient to call back with any questions or issues.   Thank you,  Johny Drilling, PharmD, BCPS, BCOP 05/25/2017   11:30 AM Oral Oncology Clinic [phone]

## 2017-05-26 MED FILL — IMBRUVICA 420 MG TAB: 420 | 28 days supply | Qty: 28 | Fill #0

## 2017-05-26 NOTE — Telephone Encounter (Signed)
Oral Chemotherapy Pharmacist Encounter   I spoke with patient for overview of: Imbruvica (ibrutinib).   Counseled patient on administration, dosing, side effects, monitoring, drug-food interactions, safe handling, storage, and disposal.  Patient will take Imbruvica 420mg  tablets, 1 tablet (420mg ) by mouth once daily. Patient will take Imbruvica at approximately the same time each day with a full glass of water and maintain adequate hydration throughout the day.  Patient knows to avoid grapefruit or grapefruit juice while on therapy with Imbruvica.  Imbruvica start date: 05/28/17  Side effects include but not limited to: bruising, decreased blood counts, N/V, diarrhea, musculoskeletal pain, arthralgias, peripheral edema, and hemorrhage.    Reviewed with patient importance of keeping a medication schedule and plan for any missed doses. Patient with multiple questions about expected length of therapy of Imbruvica. We discussed that this was a medication that we would try to maintain the patient on as long as he was tolerating it and it was beneficial to him. I instructed patient that if he had further questions about duration of treatment or other treatment options being considered at this time, that he should reach out to Dr. Alen Blew for further clarification.  Mr. Pola voiced understanding and appreciation.   All questions answered. Medication reconciliation performed and medication/allergy list updated.  Kate Sable will be made ready at the [facility] today for shipment, Kate Sable will deliver to patient's home tomorrow 05/27/2017. Patient was successfully enrolled in manufacturer co-pay card to reduce out-of-pocket expense for Imbruvica to $10 a 28-day supply. Patient states this is affordable at this time.  Patient knows to call the office with questions or concerns. Oral Oncology Clinic will continue to follow.  Thank you,  Johny Drilling, PharmD, BCPS,  BCOP 05/26/2017   11:38 AM Oral Oncology Clinic [phone]

## 2017-06-07 ENCOUNTER — Telehealth: Payer: Self-pay | Admitting: Oncology

## 2017-06-07 NOTE — Telephone Encounter (Signed)
Left message informing FMLA paperwork has been successfully faxed to [employer] at [phone]. Mailed copy to address on file.

## 2017-06-16 ENCOUNTER — Other Ambulatory Visit: Payer: Self-pay | Admitting: Oncology

## 2017-06-16 DIAGNOSIS — C9110 Chronic lymphocytic leukemia of B-cell type not having achieved remission: Secondary | ICD-10-CM

## 2017-06-18 MED FILL — IMBRUVICA 420 MG TAB: 420 | 28 days supply | Qty: 28 | Fill #0

## 2017-06-30 ENCOUNTER — Other Ambulatory Visit: Payer: 59

## 2017-06-30 ENCOUNTER — Ambulatory Visit: Payer: 59 | Admitting: Oncology

## 2017-07-12 ENCOUNTER — Encounter (INDEPENDENT_AMBULATORY_CARE_PROVIDER_SITE_OTHER): Payer: 59 | Admitting: Ophthalmology

## 2017-07-19 ENCOUNTER — Other Ambulatory Visit: Payer: Self-pay | Admitting: Oncology

## 2017-07-19 DIAGNOSIS — C9110 Chronic lymphocytic leukemia of B-cell type not having achieved remission: Secondary | ICD-10-CM

## 2017-07-20 MED FILL — IMBRUVICA 420 MG TAB: 420 | 28 days supply | Qty: 28 | Fill #0

## 2017-07-23 ENCOUNTER — Encounter (INDEPENDENT_AMBULATORY_CARE_PROVIDER_SITE_OTHER): Payer: 59 | Admitting: Ophthalmology

## 2017-07-23 DIAGNOSIS — H43813 Vitreous degeneration, bilateral: Secondary | ICD-10-CM

## 2017-07-23 DIAGNOSIS — E113513 Type 2 diabetes mellitus with proliferative diabetic retinopathy with macular edema, bilateral: Secondary | ICD-10-CM

## 2017-07-23 DIAGNOSIS — H35033 Hypertensive retinopathy, bilateral: Secondary | ICD-10-CM

## 2017-07-23 DIAGNOSIS — H2513 Age-related nuclear cataract, bilateral: Secondary | ICD-10-CM | POA: Diagnosis not present

## 2017-07-23 DIAGNOSIS — I10 Essential (primary) hypertension: Secondary | ICD-10-CM | POA: Diagnosis not present

## 2017-07-23 DIAGNOSIS — E11319 Type 2 diabetes mellitus with unspecified diabetic retinopathy without macular edema: Secondary | ICD-10-CM | POA: Diagnosis not present

## 2017-07-28 ENCOUNTER — Inpatient Hospital Stay (HOSPITAL_BASED_OUTPATIENT_CLINIC_OR_DEPARTMENT_OTHER): Payer: 59 | Admitting: Oncology

## 2017-07-28 ENCOUNTER — Inpatient Hospital Stay: Payer: 59 | Attending: Oncology

## 2017-07-28 ENCOUNTER — Telehealth: Payer: Self-pay | Admitting: Oncology

## 2017-07-28 VITALS — BP 147/72 | HR 75 | Temp 98.0°F | Resp 17 | Ht 68.5 in | Wt 154.7 lb

## 2017-07-28 DIAGNOSIS — C8580 Other specified types of non-Hodgkin lymphoma, unspecified site: Secondary | ICD-10-CM | POA: Diagnosis not present

## 2017-07-28 DIAGNOSIS — E119 Type 2 diabetes mellitus without complications: Secondary | ICD-10-CM | POA: Insufficient documentation

## 2017-07-28 DIAGNOSIS — C9110 Chronic lymphocytic leukemia of B-cell type not having achieved remission: Secondary | ICD-10-CM

## 2017-07-28 LAB — CBC WITH DIFFERENTIAL (CANCER CENTER ONLY)
BASOS PCT: 0 %
Basophils Absolute: 0.1 10*3/uL (ref 0.0–0.1)
Eosinophils Absolute: 0.3 10*3/uL (ref 0.0–0.5)
Eosinophils Relative: 0 %
HEMATOCRIT: 43.8 % (ref 38.4–49.9)
HEMOGLOBIN: 13.8 g/dL (ref 13.0–17.1)
LYMPHS ABS: 112.3 10*3/uL — AB (ref 0.9–3.3)
LYMPHS PCT: 93 %
MCH: 31.5 pg (ref 27.2–33.4)
MCHC: 31.6 g/dL — ABNORMAL LOW (ref 32.0–36.0)
MCV: 99.9 fL — AB (ref 79.3–98.0)
MONOS PCT: 2 %
Monocytes Absolute: 2 10*3/uL — ABNORMAL HIGH (ref 0.1–0.9)
NEUTROS ABS: 5.6 10*3/uL (ref 1.5–6.5)
NEUTROS PCT: 5 %
Platelet Count: 223 10*3/uL (ref 140–400)
RBC: 4.38 MIL/uL (ref 4.20–5.82)
RDW: 14.9 % — ABNORMAL HIGH (ref 11.0–14.6)
WBC Count: 120.3 10*3/uL (ref 4.0–10.3)

## 2017-07-28 LAB — CMP (CANCER CENTER ONLY)
ALBUMIN: 4.2 g/dL (ref 3.5–5.0)
ALT: 12 U/L (ref 0–55)
ANION GAP: 6 (ref 3–11)
AST: 18 U/L (ref 5–34)
Alkaline Phosphatase: 60 U/L (ref 40–150)
BUN: 13 mg/dL (ref 7–26)
CHLORIDE: 105 mmol/L (ref 98–109)
CO2: 28 mmol/L (ref 22–29)
Calcium: 9 mg/dL (ref 8.4–10.4)
Creatinine: 1.11 mg/dL (ref 0.70–1.30)
GFR, Est AFR Am: >60 mL/min (ref >60)
GFR, Estimated: >60 mL/min (ref >60)
GLUCOSE: 66 mg/dL — AB (ref 70–140)
POTASSIUM: 4.6 mmol/L (ref 3.5–5.1)
SODIUM: 139 mmol/L (ref 136–145)
Total Bilirubin: 1 mg/dL (ref 0.2–1.2)
Total Protein: 6.5 g/dL (ref 6.4–8.3)

## 2017-07-28 NOTE — Telephone Encounter (Signed)
Appointments scheduled AVS/Calendar printed per 6/19 los °

## 2017-07-28 NOTE — Progress Notes (Signed)
[facility] OFFICE PROGRESS NOTE 07/28/17  Tyler Cookey, MD [address] [place] [address]  DIAGNOSIS: 63 year old man with stage III non-Hodgkin's lymphoma diagnosed in 2012.  He was diagnosed with small lymphocytic lymphoma at that time.  Prior therapy:  Bendamustine and rituximab started in July 2017 and received total of 2 cycles. Therapy discontinued in August 2017 based on patient's wishes after achieving partial response.  He developed a relapse disease in April 2019.    CURRENT THERAPY: Ibrutinib 420 mg daily started in April 2019.  INTERVAL HISTORY: Tyler Scott is here for a follow-up.  Since the last visit, he started taking Imbruvica and reports no complications related to this medication.  He denies any excessive fatigue or tiredness.  He denies any nausea or changes in his appetite.  He did lose few pounds since last visit but did not report any major changes in his appetite or hunger level.  His cervical lymph nodes have improved per his report.  He denies any fevers or chills.  He does report periodic night sweats which has been very sporadic in nature.  He denies any easy bruising or excessive bleeding after phlebotomy.  He denies any palpitation or difficulty breathing.  He remains active and continues to attend to activities of daily living.   He does not report any headaches, blurry vision, syncope or seizures.  He denies any alteration in mental status or confusion.  He denies any fevers, chills or sweats.  He does not report any chest pain, palpitation orthopnea.   He does not report any cough, wheezing or hemoptysis. He does not report any nausea, vomiting, abdominal pain.  He denies any alteration of bowel habits.  Does not report any frequency urgency or hesitancy.  Does not report any arthralgias or myalgias.  He does not report any petechiae, ecchymosis or easy bruising.  Does not report any bleeding or clotting tendencies.  Rest of  review of systems is negative.  MEDICAL HISTORY: Past Medical History:  Diagnosis Date  . Allergy   . Arthritis   . CLL (chronic lymphocytic leukemia) ([place])    stage III: asymptomatic; on observation  . DIABETES MELLITUS, TYPE I, UNCONTROLLED 12/30/2006   insulin  . Diabetic retinopathy   . ERECTILE DYSFUNCTION, ORGANIC 12/30/2006  . Hx of adenomatous polyp of colon 05/24/2014  . HYPERLIPIDEMIA 12/30/2006  . HYPERTENSION 12/30/2006  . LOW BACK PAIN, ACUTE 12/21/2006    ALLERGIES:  has No Known Allergies.  MEDICATIONS: has a current medication list which includes the following prescription(s): one touch ultra 2, imbruvica, insulin glargine, lisinopril, meloxicam, one touch ultra test, onetouch delica lancets 48G, sildenafil, simvastatin, and sitagliptin.   PHYSICAL EXAMINATION:  Blood pressure (!) 147/72, pulse 75, temperature 98 F (36.7 C), temperature source Oral, resp. rate 17, height 5' 8.5" (1.74 m), weight 154 lb 11.2 oz (70.2 kg), SpO2 100 %.   ECOG PERFORMANCE STATUS: 0  ECOG 0 GENERAL: Alert, awake gentleman without distress. EYES: Pupils are equal and round reactive to light. OROPHARYNX: No oral thrush or ulcers with moist mucous membranes. LYMPH: No palpable adenopathy noted in the cervical, axillary or supraclavicular regions. LUNGS: Clear without any rhonchi, wheezes or dullness to percussion. HEART: Regular rate without any murmurs or gallops.  No leg edema. ABDOMEN: Soft, nontender without any rebound or guarding.  Good bowel sounds. Musculoskeletal: No joint deformity or effusion. NEURO: No sensory or motor deficits. Skin: No skin rashes or lesions.  Labs:  Lab Results  Component Value Date   WBC 83.0 (HH) 05/20/2017   HGB 12.7 (L) 05/20/2017   HCT 38.8 05/20/2017   MCV 95.1 05/20/2017   PLT 235 05/20/2017   NEUTROABS 4.3 05/20/2017      Chemistry      Component Value Date/Time   NA 142 05/20/2017 0750   NA 142 01/01/2017 0809   K 4.9  05/20/2017 0750   K 4.4 01/01/2017 0809   CL 108 05/20/2017 0750   CL 102 04/25/2012 1003   CO2 24 05/20/2017 0750   CO2 26 01/01/2017 0809   BUN 13 05/20/2017 0750   BUN 15.7 01/01/2017 0809   CREATININE 1.07 05/20/2017 0750   CREATININE 0.9 01/01/2017 0809      Component Value Date/Time   CALCIUM 9.8 05/20/2017 0750   CALCIUM 9.1 01/01/2017 0809   ALKPHOS 75 05/20/2017 0750   ALKPHOS 68 01/01/2017 0809   AST 30 05/20/2017 0750   AST 22 01/01/2017 0809   ALT 23 05/20/2017 0750   ALT 19 01/01/2017 0809   BILITOT 1.0 05/20/2017 0750   BILITOT 0.75 01/01/2017 0809        Assessment and plan:  62 year old man with:    1.  Non-Hodgkin's lymphoma diagnosed in 2012.  He was found to have lymphocytosis consistent with small lymphocytic lymphoma.  He was treated with chemotherapy in 2017 and developed a relapse disease in April 2019.  He has been on Imbruvica 420 mg daily for the last 2 months without any major complications.  He had an excellent clinical benefit at this time with improvement in his cervical adenopathy.  He reported no complications related to this medication.  His CBC showed increase in his white cell count however.  Given his excellent benefit anticipate improvement in his CBC in the near future.  I have recommended continuing this medication the same dose and schedule.  He is agreeable to continue at this time.   2.  Cardiovascular complications: None reported at this time related to improve account.  3. DM2 on insulin: No exacerbations related to improve account with his blood sugar at this time.  4.  Bleeding complications: None reported at this time.  We will continue to monitor these on this medication.  5. Follow-up: Will be in 6 weeks to follow his progress.  25 minutes was spent today face-to-face with the patient.  More than 50% of the time was dedicated to counseling, education and reviewing his future plan of care.   [name], MD 07/28/17  8:39 AM

## 2017-08-11 ENCOUNTER — Other Ambulatory Visit: Payer: Self-pay | Admitting: Oncology

## 2017-08-11 DIAGNOSIS — C9110 Chronic lymphocytic leukemia of B-cell type not having achieved remission: Secondary | ICD-10-CM

## 2017-08-16 MED FILL — IMBRUVICA 420 MG TAB: 420 | 28 days supply | Qty: 28 | Fill #0

## 2017-09-21 ENCOUNTER — Other Ambulatory Visit: Payer: Self-pay | Admitting: Nurse Practitioner

## 2017-09-28 ENCOUNTER — Telehealth: Payer: Self-pay | Admitting: Oncology

## 2017-09-28 NOTE — Telephone Encounter (Signed)
Returned call to patient re rescheduling 8/21 lab/fu. Gave patient new appointment for 9/26 @ 11:30 am - date per patient.

## 2017-09-29 ENCOUNTER — Inpatient Hospital Stay: Payer: 59

## 2017-09-29 ENCOUNTER — Inpatient Hospital Stay: Payer: 59 | Admitting: Oncology

## 2017-10-13 ENCOUNTER — Telehealth: Payer: Self-pay | Admitting: Pharmacist

## 2017-10-13 NOTE — Telephone Encounter (Signed)
Oral Chemotherapy Pharmacist Encounter   Attempted to reach patient for follow up on oral medication: Imbruvica. No answer. Left VM for patient to call back with any questions or issues.   Johny Drilling, PharmD, BCPS, BCOP  10/13/2017   12:58 PM [facility] Oncology Clinic [phone]

## 2017-11-01 ENCOUNTER — Telehealth: Payer: Self-pay | Admitting: *Deleted

## 2017-11-01 NOTE — Telephone Encounter (Signed)
MENDEL BINSFELD [phone]) left message reporting "have a cold, needs to reschedule or what?  What is the next available appointment?".  Scheduled 11-04-2017, 11:30 Lab/ 12:00 FU.   "Using Zyrtec and Robitussin for cough started Friday September 13th.  Some improvement.    Concerned because still coughing light green colored mucus.    No trouble breathing or shortness of breath.  Feel congestion when I laugh.  Temperature elevated but not out of normal range.  No swollen lymph nodes, abdominal pain.  No pain anywhere except when I cough.  I'll give this another day before Urgent Care.    [facility] before I got sick.  [facility] PCP said he was not going to take blood from me because of my blood level and cautioned me against having blood drawn.   Do not know when or where I has my last blood draw.     Taking Imruvica sporadically, last dose yesterday.  Do I need to stop the Imbruvica with the cold?  4:20 pm Temp = 97.7 F."  Aware Dr. Alen Blew returns tomorrow.  Monitor temperature, report to Urgent Care with green phlegm and if not feeling well with current symptoms.  Providers nurse will notify of future appointment needs upon receipt of provider orders.

## 2017-11-02 NOTE — Telephone Encounter (Signed)
Provider orders left on patient's voicemail.  Requested return call if any questions.

## 2017-11-02 NOTE — Telephone Encounter (Signed)
He is to keep taking Imbruvica and keep his appointment on 9/26

## 2017-11-04 ENCOUNTER — Telehealth: Payer: Self-pay | Admitting: Oncology

## 2017-11-04 ENCOUNTER — Inpatient Hospital Stay: Payer: 59

## 2017-11-04 ENCOUNTER — Inpatient Hospital Stay: Payer: 59 | Attending: Oncology | Admitting: Oncology

## 2017-11-04 VITALS — BP 128/73 | HR 77 | Temp 97.6°F | Resp 18 | Ht 68.5 in | Wt 153.8 lb

## 2017-11-04 DIAGNOSIS — C8300 Small cell B-cell lymphoma, unspecified site: Secondary | ICD-10-CM | POA: Diagnosis present

## 2017-11-04 DIAGNOSIS — C9110 Chronic lymphocytic leukemia of B-cell type not having achieved remission: Secondary | ICD-10-CM

## 2017-11-04 DIAGNOSIS — E119 Type 2 diabetes mellitus without complications: Secondary | ICD-10-CM | POA: Diagnosis not present

## 2017-11-04 LAB — CBC WITH DIFFERENTIAL (CANCER CENTER ONLY)
BASOS ABS: 0 10*3/uL (ref 0.0–0.1)
Basophils Relative: 0 %
EOS ABS: 0.3 10*3/uL (ref 0.0–0.5)
EOS PCT: 0 %
HCT: 42.1 % (ref 38.4–49.9)
Hemoglobin: 13.7 g/dL (ref 13.0–17.1)
LYMPHS ABS: 60.9 10*3/uL — AB (ref 0.9–3.3)
Lymphocytes Relative: 90 %
MCH: 31 pg (ref 27.2–33.4)
MCHC: 32.4 g/dL (ref 32.0–36.0)
MCV: 95.8 fL (ref 79.3–98.0)
MONO ABS: 0.9 10*3/uL (ref 0.1–0.9)
Monocytes Relative: 1 %
Neutro Abs: 6.1 10*3/uL (ref 1.5–6.5)
Neutrophils Relative %: 9 %
PLATELETS: 232 10*3/uL (ref 140–400)
RBC: 4.4 MIL/uL (ref 4.20–5.82)
RDW: 13.7 % (ref 11.0–14.6)
WBC: 68.2 10*3/uL — AB (ref 4.0–10.3)

## 2017-11-04 LAB — CMP (CANCER CENTER ONLY)
ALK PHOS: 65 U/L (ref 38–126)
ALT: 16 U/L (ref 0–44)
ANION GAP: 8 (ref 5–15)
AST: 20 U/L (ref 15–41)
Albumin: 4.3 g/dL (ref 3.5–5.0)
BILIRUBIN TOTAL: 1.8 mg/dL — AB (ref 0.3–1.2)
BUN: 14 mg/dL (ref 8–23)
CALCIUM: 9.4 mg/dL (ref 8.9–10.3)
CO2: 29 mmol/L (ref 22–32)
Chloride: 106 mmol/L (ref 98–111)
Creatinine: 1.06 mg/dL (ref 0.61–1.24)
GFR, Est AFR Am: >60 mL/min (ref >60)
Glucose, Bld: 79 mg/dL (ref 70–99)
POTASSIUM: 4.2 mmol/L (ref 3.5–5.1)
SODIUM: 143 mmol/L (ref 135–145)
TOTAL PROTEIN: 7.1 g/dL (ref 6.5–8.1)

## 2017-11-04 NOTE — Progress Notes (Signed)
[place] OFFICE PROGRESS NOTE 11/04/17  Tyler Cookey, MD [address] [place] [address]  DIAGNOSIS: 62 year old man with small lymphocytic lymphoma diagnosed in 2012.  He presented with stage III disease with lymphadenopathy and lymphocytosis.  Prior therapy:  Bendamustine and rituximab started in July 2017 and received total of 2 cycles. Therapy discontinued in August 2017 based on patient's wishes after achieving partial response.  He developed a relapse disease in April 2019.    CURRENT THERAPY: Ibrutinib 420 mg daily started in April 2019.  INTERVAL HISTORY: Tyler Scott presents today for a follow-up.  Since her last visit, he reports no major changes in his health.  He continues to take ibrutinib without any major complications.  He has some compliance issues with this medication but has taken it for the most part regularly.  He has reported some occasional unsteadiness and dizziness that resolved spontaneously.  He denies any bleeding or easy bruising.  He denies any palpitation or dyspnea exertion.  His appetite and performance status is unchanged.   He does not report any headaches, blurry vision, syncope or seizures.  He denies any dizziness or lethargy.  He denies any fevers, chills or sweats.  He does not report any chest pain, palpitation orthopnea.   He does not report any cough, wheezing or hemoptysis. He does not report any nausea, vomiting, abdominal pain.  He denies any constipation or diarrhea.  Does not report any frequency urgency or hesitancy.  Does not report any new pathological fractures.  He does not report any petechiae, ecchymosis or easy bruising.  He denies any lymphadenopathy.  Rest of review of systems is negative.  MEDICAL HISTORY: Past Medical History:  Diagnosis Date  . Allergy   . Arthritis   . CLL (chronic lymphocytic leukemia) ([place])    stage III: asymptomatic; on observation  . DIABETES MELLITUS, TYPE I,  UNCONTROLLED 12/30/2006   insulin  . Diabetic retinopathy   . ERECTILE DYSFUNCTION, ORGANIC 12/30/2006  . Hx of adenomatous polyp of colon 05/24/2014  . HYPERLIPIDEMIA 12/30/2006  . HYPERTENSION 12/30/2006  . LOW BACK PAIN, ACUTE 12/21/2006    ALLERGIES:  has No Known Allergies.  MEDICATIONS: has a current medication list which includes the following prescription(s): one touch ultra 2, imbruvica, insulin glargine, lisinopril, meloxicam, one touch ultra test, onetouch delica lancets 77L, sildenafil, simvastatin, and sitagliptin.   PHYSICAL EXAMINATION:  There were no vitals taken for this visit.  Blood pressure 128/73, pulse 77, temperature 97.6 F (36.4 C), temperature source Oral, resp. rate 18, height 5' 8.5" (1.74 m), weight 153 lb 12.8 oz (69.8 kg), SpO2 100 %.   ECOG PERFORMANCE STATUS: 0  General appearance: Comfortable appearing without any discomfort Head: Normocephalic without any trauma Oropharynx: Mucous membranes are moist and pink without any thrush or ulcers. Eyes: Pupils are equal and round reactive to light. Lymph nodes: No cervical, supraclavicular, inguinal or axillary lymphadenopathy.   Heart:regular rate and rhythm.  S1 and S2 without leg edema. Lung: Clear without any rhonchi or wheezes.  No dullness to percussion. Abdomin: Soft, nontender, nondistended with good bowel sounds.  No hepatosplenomegaly. Musculoskeletal: No joint deformity or effusion.  Full range of motion noted. Neurological: No deficits noted on motor, sensory and deep tendon reflex exam. Skin: No petechial rash or dryness.  Appeared moist.  Psychiatric: Mood and affect appeared appropriate.   Labs:  Lab Results  Component Value Date   WBC 68.2 (HH) 11/04/2017   HGB 13.7 11/04/2017  HCT 42.1 11/04/2017   MCV 95.8 11/04/2017   PLT 232 11/04/2017   NEUTROABS 6.1 11/04/2017      Chemistry      Component Value Date/Time   NA 139 07/28/2017 0830   NA 142 01/01/2017 0809   K 4.6  07/28/2017 0830   K 4.4 01/01/2017 0809   CL 105 07/28/2017 0830   CL 102 04/25/2012 1003   CO2 28 07/28/2017 0830   CO2 26 01/01/2017 0809   BUN 13 07/28/2017 0830   BUN 15.7 01/01/2017 0809   CREATININE 1.11 07/28/2017 0830   CREATININE 0.9 01/01/2017 0809      Component Value Date/Time   CALCIUM 9.0 07/28/2017 0830   CALCIUM 9.1 01/01/2017 0809   ALKPHOS 60 07/28/2017 0830   ALKPHOS 68 01/01/2017 0809   AST 18 07/28/2017 0830   AST 22 01/01/2017 0809   ALT 12 07/28/2017 0830   ALT 19 01/01/2017 0809   BILITOT 1.0 07/28/2017 0830   BILITOT 0.75 01/01/2017 0809        Assessment and plan:  62 year old man with:    1.  Small lymphocytic lymphoma diagnosed in 2012.  He was found to have lymphadenopathy and lymphocytosis.    He continues to be on Imbruvica without any major complications.  He has reported periodic dizziness and unsteadiness that resolved spontaneously.  His CBC was reviewed today and continues to show excellent improvement in his white cell count.  Risks and benefits of continuing this medication was reviewed today and is agreeable to continue at this time.  2.  Cardiovascular complications: He reports no complaints at this time.  His examination revealed normal sinus rhythm without any arrhythmia.  3.  Diabetes: Remains under control at this time.  No recent exacerbation.  4.  Bleeding complications: I continue to educate him about the possibility of bleeding complication associated with this medication.  No issues reported.  5. Follow-up: Will be in 3 months to follow his progress.  15 minutes was spent today face-to-face with the patient.  More than 50% of the time was dedicated to reviewing the natural course of his disease, treatment options and coordinating plan of care.   [name], MD 11/04/17 11:42 AM

## 2017-11-04 NOTE — Telephone Encounter (Signed)
Scheduled apt per 9/26 los - gave patient AVS and calender per los.

## 2018-01-24 ENCOUNTER — Encounter (INDEPENDENT_AMBULATORY_CARE_PROVIDER_SITE_OTHER): Payer: 59 | Admitting: Ophthalmology

## 2018-01-24 DIAGNOSIS — H35033 Hypertensive retinopathy, bilateral: Secondary | ICD-10-CM

## 2018-01-24 DIAGNOSIS — I10 Essential (primary) hypertension: Secondary | ICD-10-CM | POA: Diagnosis not present

## 2018-01-24 DIAGNOSIS — H43813 Vitreous degeneration, bilateral: Secondary | ICD-10-CM

## 2018-01-24 DIAGNOSIS — E11319 Type 2 diabetes mellitus with unspecified diabetic retinopathy without macular edema: Secondary | ICD-10-CM

## 2018-01-24 DIAGNOSIS — E113593 Type 2 diabetes mellitus with proliferative diabetic retinopathy without macular edema, bilateral: Secondary | ICD-10-CM

## 2018-01-24 DIAGNOSIS — H2513 Age-related nuclear cataract, bilateral: Secondary | ICD-10-CM

## 2018-02-03 ENCOUNTER — Telehealth: Payer: Self-pay | Admitting: Oncology

## 2018-02-03 NOTE — Telephone Encounter (Signed)
R/s appt per 12/26 sch message - pt is aware of appt date and time

## 2018-02-04 ENCOUNTER — Inpatient Hospital Stay: Payer: 59 | Admitting: Oncology

## 2018-02-04 ENCOUNTER — Inpatient Hospital Stay: Payer: 59

## 2018-03-16 ENCOUNTER — Telehealth: Payer: Self-pay

## 2018-03-16 ENCOUNTER — Inpatient Hospital Stay (HOSPITAL_BASED_OUTPATIENT_CLINIC_OR_DEPARTMENT_OTHER): Payer: 59 | Admitting: Oncology

## 2018-03-16 ENCOUNTER — Inpatient Hospital Stay: Payer: 59 | Attending: Oncology

## 2018-03-16 VITALS — BP 140/70 | HR 80 | Temp 97.8°F | Resp 17 | Wt 154.4 lb

## 2018-03-16 DIAGNOSIS — C8580 Other specified types of non-Hodgkin lymphoma, unspecified site: Secondary | ICD-10-CM | POA: Insufficient documentation

## 2018-03-16 DIAGNOSIS — E119 Type 2 diabetes mellitus without complications: Secondary | ICD-10-CM

## 2018-03-16 DIAGNOSIS — C9110 Chronic lymphocytic leukemia of B-cell type not having achieved remission: Secondary | ICD-10-CM | POA: Insufficient documentation

## 2018-03-16 LAB — CBC WITH DIFFERENTIAL (CANCER CENTER ONLY)
ABS IMMATURE GRANULOCYTES: 0.09 10*3/uL — AB (ref 0.00–0.07)
BASOS ABS: 0.1 10*3/uL (ref 0.0–0.1)
BASOS PCT: 0 %
Eosinophils Absolute: 0.2 10*3/uL (ref 0.0–0.5)
Eosinophils Relative: 0 %
HCT: 40.4 % (ref 39.0–52.0)
Hemoglobin: 13.1 g/dL (ref 13.0–17.0)
Immature Granulocytes: 0 %
Lymphocytes Relative: 86 %
Lymphs Abs: 53.2 10*3/uL — ABNORMAL HIGH (ref 0.7–4.0)
MCH: 30.8 pg (ref 26.0–34.0)
MCHC: 32.4 g/dL (ref 30.0–36.0)
MCV: 95.1 fL (ref 80.0–100.0)
MONOS PCT: 7 %
Monocytes Absolute: 4.4 10*3/uL — ABNORMAL HIGH (ref 0.1–1.0)
NEUTROS ABS: 4.6 10*3/uL (ref 1.7–7.7)
NEUTROS PCT: 7 %
NRBC: 0 % (ref 0.0–0.2)
PLATELETS: 216 10*3/uL (ref 150–400)
RBC: 4.25 MIL/uL (ref 4.22–5.81)
RDW: 13.1 % (ref 11.5–15.5)
WBC: 62.5 10*3/uL — AB (ref 4.0–10.5)

## 2018-03-16 LAB — CMP (CANCER CENTER ONLY)
ALBUMIN: 4.4 g/dL (ref 3.5–5.0)
ALT: 14 U/L (ref 0–44)
ANION GAP: 9 (ref 5–15)
AST: 18 U/L (ref 15–41)
Alkaline Phosphatase: 64 U/L (ref 38–126)
BILIRUBIN TOTAL: 1.6 mg/dL — AB (ref 0.3–1.2)
BUN: 12 mg/dL (ref 8–23)
CO2: 27 mmol/L (ref 22–32)
Calcium: 9.1 mg/dL (ref 8.9–10.3)
Chloride: 103 mmol/L (ref 98–111)
Creatinine: 1.12 mg/dL (ref 0.61–1.24)
GFR, Est AFR Am: >60 mL/min (ref >60)
GFR, Estimated: >60 mL/min (ref >60)
GLUCOSE: 232 mg/dL — AB (ref 70–99)
POTASSIUM: 4.3 mmol/L (ref 3.5–5.1)
Sodium: 139 mmol/L (ref 135–145)
TOTAL PROTEIN: 6.9 g/dL (ref 6.5–8.1)

## 2018-03-16 NOTE — Progress Notes (Signed)
[place] OFFICE PROGRESS NOTE 03/16/18  [name], MD No address on file  DIAGNOSIS: 63 year old man with stage III small lymphocytic lymphoma (SLL) presented with lymphadenopathy and leukocytosis diagnosed in 2012.    Prior therapy:  Bendamustine and rituximab started in July 2017 and received total of 2 cycles. Therapy discontinued in August 2017 based on patient's wishes after achieving partial response.  He developed a relapse disease in April 2019.    CURRENT THERAPY: Ibrutinib 420 mg daily started in April 2019.  INTERVAL HISTORY: Tyler Scott is here for a repeat evaluation.  Since the last visit, he reports no major changes in his health.  He continues to tolerate the ibrutinib without any major complications.  He denies any easy bruising, palpitation or bleeding issues.  His appetite remains reasonable and his weight is stable.  He does report some increased fatigue and tiredness and occasional lethargy associated with it.  Overall performance status and activity level is unchanged.  He does report that he missed doses periodically which she feels contributes to his tolerance to this medication.  Patient denied any alteration mental status, neuropathy, confusion or dizziness.  Denies any headaches or lethargy.  Denies any night sweats, weight loss or changes in appetite.  Denied orthopnea, dyspnea on exertion or chest discomfort.  Denies shortness of breath, difficulty breathing hemoptysis or cough.  Denies any abdominal distention, nausea, early satiety or dyspepsia.  Denies any hematuria, frequency, dysuria or nocturia.  Denies any skin irritation, dryness or rash.  Denies any ecchymosis or petechiae.  Denies any lymphadenopathy or clotting.  Denies any heat or cold intolerance.  Denies any anxiety or depression.  Remaining review of system is negative.   MEDICAL HISTORY: Past Medical History:  Diagnosis Date  . Allergy   . Arthritis   . CLL (chronic  lymphocytic leukemia) ([place])    stage III: asymptomatic; on observation  . DIABETES MELLITUS, TYPE I, UNCONTROLLED 12/30/2006   insulin  . Diabetic retinopathy   . ERECTILE DYSFUNCTION, ORGANIC 12/30/2006  . Hx of adenomatous polyp of colon 05/24/2014  . HYPERLIPIDEMIA 12/30/2006  . HYPERTENSION 12/30/2006  . LOW BACK PAIN, ACUTE 12/21/2006    ALLERGIES:  has No Known Allergies.  MEDICATIONS: has a current medication list which includes the following prescription(s): one touch ultra 2, doxepin, imbruvica, insulin glargine, lisinopril, meloxicam, one touch ultra test, onetouch delica lancets 38B, sildenafil, simvastatin, and sitagliptin.   PHYSICAL EXAMINATION:  Blood pressure 140/70, pulse 80, temperature 97.8 F (36.6 C), temperature source Oral, resp. rate 17, weight 154 lb 7 oz (70.1 kg), SpO2 100 %.    ECOG PERFORMANCE STATUS: 0    General appearance: Alert, awake without any distress. Head: Atraumatic without abnormalities Oropharynx: Without any thrush or ulcers. Eyes: No scleral icterus. Lymph nodes: No lymphadenopathy noted in the cervical, supraclavicular, or axillary nodes Heart:regular rate and rhythm, without any murmurs or gallops.   Lung: Clear to auscultation without any rhonchi, wheezes or dullness to percussion. Abdomin: Soft, nontender without any shifting dullness or ascites. Musculoskeletal: No clubbing or cyanosis. Neurological: No motor or sensory deficits. Skin: No rashes or lesions.    Labs:  Lab Results  Component Value Date   WBC 68.2 (HH) 11/04/2017   HGB 13.7 11/04/2017   HCT 42.1 11/04/2017   MCV 95.8 11/04/2017   PLT 232 11/04/2017   NEUTROABS 6.1 11/04/2017      Chemistry      Component Value Date/Time   NA 143 11/04/2017 1121  NA 142 01/01/2017 0809   K 4.2 11/04/2017 1121   K 4.4 01/01/2017 0809   CL 106 11/04/2017 1121   CL 102 04/25/2012 1003   CO2 29 11/04/2017 1121   CO2 26 01/01/2017 0809   BUN 14 11/04/2017 1121    BUN 15.7 01/01/2017 0809   CREATININE 1.06 11/04/2017 1121   CREATININE 0.9 01/01/2017 0809      Component Value Date/Time   CALCIUM 9.4 11/04/2017 1121   CALCIUM 9.1 01/01/2017 0809   ALKPHOS 65 11/04/2017 1121   ALKPHOS 68 01/01/2017 0809   AST 20 11/04/2017 1121   AST 22 01/01/2017 0809   ALT 16 11/04/2017 1121   ALT 19 01/01/2017 0809   BILITOT 1.8 (H) 11/04/2017 1121   BILITOT 0.75 01/01/2017 0809        Assessment and plan:  63 year old man with:    1.  Stage III small lymphocytic lymphoma diagnosed in 2012.  He presented with lymphadenopathy and leukocytosis.  He remains on Imbruvica without any major complications.  Risks and benefits of continuing this therapy long-term was reviewed.  Cardiac complication as well as bleeding issues were also reviewed.  After discussion today he is agreeable to continue at this time.  CBC was personally reviewed and discussed with the patient.  His white cell count is declining slowly and is a function of strict compliance at this time.  I recommended continuing this medication at this time and is agreeable to do so.  2.  Cardiovascular complications: None reported at this time.  3.  Diabetes: No issues or exacerbations.  Blood sugar remains under reasonable control.  4.  Bleeding complications: No bleeding noted at this time.  I continue to reiterate potential complication associated with this medication including bleeding issues.  5. Follow-up: Will be in 3 months to repeat laboratory testing and evaluation.  25 minutes was spent today face-to-face with the patient.  More than 50% of the time was dedicated to discussing his disease status, reviewing laboratory data, addressing complications related to therapy.   [name], MD 03/16/18 10:35 AM

## 2018-03-16 NOTE — Telephone Encounter (Signed)
Printed avs and calender of upcoming appointment. Per 2/5 los 

## 2018-06-15 ENCOUNTER — Inpatient Hospital Stay: Payer: 59 | Admitting: Oncology

## 2018-06-15 ENCOUNTER — Inpatient Hospital Stay: Payer: 59

## 2018-07-27 ENCOUNTER — Encounter (INDEPENDENT_AMBULATORY_CARE_PROVIDER_SITE_OTHER): Payer: 59 | Admitting: Ophthalmology

## 2018-12-02 ENCOUNTER — Telehealth: Payer: Self-pay | Admitting: Oncology

## 2018-12-02 ENCOUNTER — Telehealth: Payer: Self-pay

## 2018-12-02 ENCOUNTER — Other Ambulatory Visit: Payer: Self-pay

## 2018-12-02 DIAGNOSIS — C9110 Chronic lymphocytic leukemia of B-cell type not having achieved remission: Secondary | ICD-10-CM

## 2018-12-02 MED ORDER — IMBRUVICA 420 MG PO TABS: 420.0000 mg | ORAL_TABLET | Freq: Every day | ORAL | 0 refills | Status: DC

## 2018-12-02 NOTE — Telephone Encounter (Signed)
-----   Message from Wyatt Portela, MD sent at 12/02/2018 11:00 AM EDT ----- Regarding: RE: Worthy Keeler to refill. I will send a message to schedule him. Thanks.  ----- Message ----- From: Scot Dock, RN Sent: 12/02/2018  10:44 AM EDT To: Wyatt Portela, MD Subject: Kate Sable                                      Last refill was sent in 08/13/17, last office visit 03/16/18, and no future scheduled visits. Just want to make sure okay to fill Imbruvica. Please advise. Thanks

## 2018-12-02 NOTE — Telephone Encounter (Signed)
Scheduled appt per 10/23 sch message - pt is aware of appt date and time   

## 2018-12-06 ENCOUNTER — Telehealth: Payer: Self-pay | Admitting: Pharmacist

## 2018-12-06 MED FILL — IMBRUVICA 420 MG TAB: 420 | 28 days supply | Qty: 28 | Fill #0

## 2018-12-06 NOTE — Telephone Encounter (Signed)
Oral Chemotherapy Pharmacist Encounter   I spoke with patient for overview of: Imbruvica (ibrutinib) for the treatment of SLL/CLL, planned duration until progression or unacceptable toxicity.   Counseled patient on administration, dosing, side effects, monitoring, drug-food interactions, safe handling, storage, and disposal.  Patient will take Imbruvica 420 mg tablets, 1 tablet (420mg ) by mouth once daily.  Patient will take Imbruvica at approximately the same time each day with a full glass of water and maintain adequate hydration throughout the day.  Patient stated the Imbruvica made him drowsy and "foggy-headed" with previous administration, so he will move his administration time to after dinner each evening in an attempt to sleep through these issues.  Patient knows to avoid grapefruit or grapefruit juice while on therapy with Imbruvica.  Imbruvica start date: 12/07/2018  Adverse effects include but are not limited to: bruising, decreased blood counts, N/V, diarrhea, musculoskeletal pain, arthralgias, peripheral edema, and hemorrhage.   Patient will obtain anti diarrheal and alert the office of 4 or more loose stools above baseline.  Reviewed with patient importance of keeping a medication schedule and plan for any missed doses.  Medication reconciliation performed and medication/allergy list updated.  Insurance authorization for Kate Sable was not required at this time. Imbruvica copayment $10 per fill as patient has a manufacturer copayment coupon on file from last course. This will ship from the [facility] on 12/06/18 to deliver to patient's home on 10/28.  Patient informed the pharmacy will reach out 5-7 days prior to needing next fill of Imbruvica to coordinate continued medication acquisition to prevent break in therapy.  Patient informed this prescription was sent without refills, but another prescription will be sent after he is seen back in the office  on 11/27/18. Patient stated he would like to discuss other treatment options with Dr. Alen Blew at this next visit since he doesn't want to take a pill for the rest of his life.  All questions answered.  Tyler Scott voiced understanding and appreciation.   Patient knows to call the office with questions or concerns.  Tyler Scott, PharmD, BCPS, BCOP  12/06/2018   10:06 AM Oral Oncology Clinic [phone]

## 2018-12-06 NOTE — Telephone Encounter (Signed)
Oral Oncology Pharmacist Encounter  Received notification from [facility] that they had received a new prescription for Imbruvica (ibrutinib) for the treatment of SLL/CLL, planned duration until progression or unacceptable toxicity.  Patient was originally started on ibrutinib on 05/29/18 for relapsed disease. Patient admittedly took his medication sporadically at that time, although did experience clinical benefit. Patient had endorsed some episodes of dizziness/lightheadedness, although these episodes resolved. He also experienced some fatigue and decreased energy level. Patient's last office visit was on 03/16/18, patient failed to keep May 2020 appointments and was lost to follow-up. Last ibrutinib fill from the pharmacy was 08/16/2017  Patient has requested to restart ibrutinib. New prescription sent to the pharmacy by MD on 12/02/18 for 420 mg by mouth once daily. This pharmacist was notified on 12/05/18 of prescription.  No recent labs to assess, will be rechecked at next scheduled office visit on 12/28/18.  Current medication list in Epic reviewed, moderate DDIs with ibrutinib and meloxican identified:  Category C interaction: both agents with ability to decrease platelet aggregation leading to increased risk of bruising and bleeding. Patient previously on this medication combination without bruising or bleeding issues. Patient will be counseled to report bruising or bleeding, no change to current therapy indicated at this time.  Prescription has been e-scribed to the [facility] for benefits analysis and approval on 12/02/18 by MD.  Oral Oncology Clinic will continue to follow for insurance authorization, copayment issues, initial counseling and start date.  Johny Drilling, PharmD, BCPS, BCOP  12/06/2018 7:24 AM Oral Oncology Clinic [phone]

## 2018-12-07 NOTE — Telephone Encounter (Signed)
Oral Oncology Patient Advocate Encounter  Confirmed with [facility] that Kate Sable was shipped on 10/27 with a $10 copay using a copay card.   [facility] Phone [phone] Fax [phone] 12/07/2018   8:54 AM

## 2018-12-28 ENCOUNTER — Telehealth: Payer: Self-pay | Admitting: Oncology

## 2018-12-28 ENCOUNTER — Inpatient Hospital Stay: Payer: 59 | Attending: Oncology | Admitting: Oncology

## 2018-12-28 ENCOUNTER — Inpatient Hospital Stay: Payer: 59

## 2018-12-28 ENCOUNTER — Other Ambulatory Visit: Payer: Self-pay

## 2018-12-28 ENCOUNTER — Telehealth: Payer: Self-pay

## 2018-12-28 VITALS — BP 137/72 | HR 81 | Temp 97.8°F | Resp 18 | Ht 68.5 in | Wt 153.1 lb

## 2018-12-28 DIAGNOSIS — Z794 Long term (current) use of insulin: Secondary | ICD-10-CM | POA: Insufficient documentation

## 2018-12-28 DIAGNOSIS — Z9221 Personal history of antineoplastic chemotherapy: Secondary | ICD-10-CM | POA: Insufficient documentation

## 2018-12-28 DIAGNOSIS — I10 Essential (primary) hypertension: Secondary | ICD-10-CM | POA: Diagnosis not present

## 2018-12-28 DIAGNOSIS — N529 Male erectile dysfunction, unspecified: Secondary | ICD-10-CM | POA: Insufficient documentation

## 2018-12-28 DIAGNOSIS — E785 Hyperlipidemia, unspecified: Secondary | ICD-10-CM | POA: Insufficient documentation

## 2018-12-28 DIAGNOSIS — C9110 Chronic lymphocytic leukemia of B-cell type not having achieved remission: Secondary | ICD-10-CM

## 2018-12-28 DIAGNOSIS — Z791 Long term (current) use of non-steroidal anti-inflammatories (NSAID): Secondary | ICD-10-CM | POA: Insufficient documentation

## 2018-12-28 DIAGNOSIS — Z79899 Other long term (current) drug therapy: Secondary | ICD-10-CM | POA: Insufficient documentation

## 2018-12-28 DIAGNOSIS — E1065 Type 1 diabetes mellitus with hyperglycemia: Secondary | ICD-10-CM | POA: Diagnosis not present

## 2018-12-28 LAB — CBC WITH DIFFERENTIAL (CANCER CENTER ONLY)
Abs Immature Granulocytes: 0.21 10*3/uL — ABNORMAL HIGH (ref 0.00–0.07)
Basophils Absolute: 0.1 10*3/uL (ref 0.0–0.1)
Basophils Relative: 0 %
Eosinophils Absolute: 0.1 10*3/uL (ref 0.0–0.5)
Eosinophils Relative: 0 %
HCT: 41.5 % (ref 39.0–52.0)
Hemoglobin: 13 g/dL (ref 13.0–17.0)
Immature Granulocytes: 0 %
Lymphocytes Relative: 90 %
Lymphs Abs: 104.3 10*3/uL — ABNORMAL HIGH (ref 0.7–4.0)
MCH: 31 pg (ref 26.0–34.0)
MCHC: 31.3 g/dL (ref 30.0–36.0)
MCV: 99 fL (ref 80.0–100.0)
Monocytes Absolute: 5.2 10*3/uL — ABNORMAL HIGH (ref 0.1–1.0)
Monocytes Relative: 5 %
Neutro Abs: 5.4 10*3/uL (ref 1.7–7.7)
Neutrophils Relative %: 5 %
Platelet Count: 230 10*3/uL (ref 150–400)
RBC: 4.19 MIL/uL — ABNORMAL LOW (ref 4.22–5.81)
RDW: 13.4 % (ref 11.5–15.5)
WBC Count: 115.2 10*3/uL (ref 4.0–10.5)
nRBC: 0 % (ref 0.0–0.2)

## 2018-12-28 LAB — CMP (CANCER CENTER ONLY)
ALT: 13 U/L (ref 0–44)
AST: 17 U/L (ref 15–41)
Albumin: 4.7 g/dL (ref 3.5–5.0)
Alkaline Phosphatase: 63 U/L (ref 38–126)
Anion gap: 12 (ref 5–15)
BUN: 14 mg/dL (ref 8–23)
CO2: 24 mmol/L (ref 22–32)
Calcium: 9.2 mg/dL (ref 8.9–10.3)
Chloride: 102 mmol/L (ref 98–111)
Creatinine: 1.11 mg/dL (ref 0.61–1.24)
GFR, Est AFR Am: >60 mL/min (ref >60)
GFR, Estimated: >60 mL/min (ref >60)
Glucose, Bld: 157 mg/dL — ABNORMAL HIGH (ref 70–99)
Potassium: 4.6 mmol/L (ref 3.5–5.1)
Sodium: 138 mmol/L (ref 135–145)
Total Bilirubin: 1.2 mg/dL (ref 0.3–1.2)
Total Protein: 7.4 g/dL (ref 6.5–8.1)

## 2018-12-28 NOTE — Telephone Encounter (Signed)
Received a call from [facility] in the lab at 12:53 with critical WBC of 115.2. Dr. Alen Blew made aware at 12:56. No further instruction received.

## 2018-12-28 NOTE — Progress Notes (Signed)
[place] OFFICE PROGRESS NOTE 12/28/18  [name], MD (Inactive) No address on file  DIAGNOSIS: 63 year old man with small lymphocytic lymphoma diagnosed in 2012 with a stage IIIa disease.  Prior therapy:  Bendamustine and rituximab started in July 2017 and received total of 2 cycles. Therapy discontinued in August 2017 based on patient's wishes after achieving partial response.  He developed a relapse disease in April 2019.    CURRENT THERAPY: Ibrutinib 420 mg daily started in April 2019.  INTERVAL HISTORY: Tyler Scott returns today for a repeat evaluation.  Since the last visit, he reports no major changes in his health.  He has not not been taking ibrutinib for many months but therapy resumed in the last few weeks.  He denies any complications related to this medication.  He does report some mild fatigue and tiredness but no palpitation or bleeding issues.  His performance status and quality of life remain unchanged.   He denied headaches, blurry vision, syncope or seizures.  Denies any fevers, chills or sweats.  Denied chest pain, palpitation, orthopnea or leg edema.  Denied cough, wheezing or hemoptysis.  Denied nausea, vomiting or abdominal pain.  Denies any constipation or diarrhea.  Denies any frequency urgency or hesitancy.  Denies any arthralgias or myalgias.  Denies any skin rashes or lesions.  Denies any bleeding or clotting tendency.  Denies any easy bruising.  Denies any hair or nail changes.  Denies any anxiety or depression.  Remaining review of system is negative.     MEDICAL HISTORY: Past Medical History:  Diagnosis Date  . Allergy   . Arthritis   . CLL (chronic lymphocytic leukemia) ([address])    stage III: asymptomatic; on observation  . DIABETES MELLITUS, TYPE I, UNCONTROLLED 12/30/2006   insulin  . Diabetic retinopathy   . ERECTILE DYSFUNCTION, ORGANIC 12/30/2006  . Hx of adenomatous polyp of colon 05/24/2014  . HYPERLIPIDEMIA 12/30/2006   . HYPERTENSION 12/30/2006  . LOW BACK PAIN, ACUTE 12/21/2006    ALLERGIES:  has No Known Allergies.  MEDICATIONS: Updated today on review.  Current Outpatient Medications on File Prior to Visit  Medication Sig Dispense Refill  . Blood Glucose Monitoring Suppl (ONE TOUCH ULTRA 2) W/DEVICE KIT See admin instructions.  0  . doxepin (SINEQUAN) 25 MG capsule     . Ibrutinib (IMBRUVICA) 420 MG TABS Take 420 mg by mouth daily. Take with a glass of water the same time each day. 28 tablet 0  . Insulin Glargine (LANTUS SOLOSTAR) 100 UNIT/ML Solostar Pen INJECT 20 UNITS SUBCUTANEOUSLY AT BEDTIME 3 pen 11  . lisinopril (PRINIVIL,ZESTRIL) 40 MG tablet Take 1 tablet (40 mg total) by mouth daily. 90 tablet 4  . meloxicam (MOBIC) 7.5 MG tablet TAKE 1 TABLET (7.5 MG TOTAL) BY MOUTH DAILY. 30 tablet 1  . ONE TOUCH ULTRA TEST test strip TEST GLUCOSE 3 TIMES A DAY 300 each 1  . ONETOUCH DELICA LANCETS 40X MISC TEST GLUCOSE 3 TIMES A DAY 300 each 1  . sildenafil (REVATIO) 20 MG tablet Take 1 to 2 tabs 2 - 3 hours before sex 30 tablet 11  . simvastatin (ZOCOR) 20 MG tablet TAKE 1 TABLET (20 MG TOTAL) BY MOUTH AT BEDTIME. 100 tablet 4  . sitaGLIPtin (JANUVIA) 100 MG tablet Take 1 tablet (100 mg total) by mouth daily. 30 tablet 11   No current facility-administered medications on file prior to visit.       PHYSICAL EXAMINATION:  Blood pressure 137/72, pulse 81,  temperature 97.8 F (36.6 C), temperature source Temporal, resp. rate 18, height 5' 8.5" (1.74 m), weight 153 lb 1.6 oz (69.4 kg), SpO2 99 %.     ECOG PERFORMANCE STATUS: 0    General appearance: Comfortable appearing without any discomfort Head: Normocephalic without any trauma Oropharynx: Mucous membranes are moist and pink without any thrush or ulcers. Eyes: Pupils are equal and round reactive to light. Lymph nodes: No cervical, supraclavicular, inguinal or axillary lymphadenopathy.   Heart:regular rate and rhythm.  S1 and S2 without  leg edema. Lung: Clear without any rhonchi or wheezes.  No dullness to percussion. Abdomin: Soft, nontender, nondistended with good bowel sounds.  No hepatosplenomegaly. Musculoskeletal: No joint deformity or effusion.  Full range of motion noted. Neurological: No deficits noted on motor, sensory and deep tendon reflex exam. Skin: No petechial rash or dryness.  Appeared moist.      Labs:  Lab Results  Component Value Date   WBC 62.5 (HH) 03/16/2018   HGB 13.1 03/16/2018   HCT 40.4 03/16/2018   MCV 95.1 03/16/2018   PLT 216 03/16/2018   NEUTROABS 4.6 03/16/2018      Chemistry      Component Value Date/Time   NA 139 03/16/2018 1026   NA 142 01/01/2017 0809   K 4.3 03/16/2018 1026   K 4.4 01/01/2017 0809   CL 103 03/16/2018 1026   CL 102 04/25/2012 1003   CO2 27 03/16/2018 1026   CO2 26 01/01/2017 0809   BUN 12 03/16/2018 1026   BUN 15.7 01/01/2017 0809   CREATININE 1.12 03/16/2018 1026   CREATININE 0.9 01/01/2017 0809      Component Value Date/Time   CALCIUM 9.1 03/16/2018 1026   CALCIUM 9.1 01/01/2017 0809   ALKPHOS 64 03/16/2018 1026   ALKPHOS 68 01/01/2017 0809   AST 18 03/16/2018 1026   AST 22 01/01/2017 0809   ALT 14 03/16/2018 1026   ALT 19 01/01/2017 0809   BILITOT 1.6 (H) 03/16/2018 1026   BILITOT 0.75 01/01/2017 0809        Assessment and plan:  63 year old man with:    1.  Small lymphocytic lymphoma presented with lymphadenopathy and lymphocytosis diagnosed in 2012.  Stage III small   He is status post therapy outlined above and relapsed in April 2019 and has been on Imbruvica which he took for a period of time with reasonable decline in his leukocytosis but has been off therapy for many months at this time.  Therapy resumed in the last few weeks.  The natural course of this disease was reviewed today in detail and alternative options were reviewed.  Treatment with systemic chemotherapy instead of ibrutinib was offered and discussed.   Complication associated with chemotherapy versus long-term ibrutinib use was discussed.  For the time being he is agreeable to continue although he understands if he has less than optimal response or he is unwilling and switching to chemotherapy would be his next option.    2.  Cardiovascular complications: I continue to educate him about potential cardiovascular issues and he is not experiencing any at this time..  3.  Diabetes: Blood sugar issues noted at this time.  4.  Bleeding complications: I continue to reiterate potential bleeding complications associated with this medication.  5. Follow-up: He will return in 2 months for repeat follow-up.   25 minutes was spent today face-to-face with the patient.  More than 50% of the time was spent on reviewing his disease status, reviewing laboratory data  as well as outlining future plan of care.   [name], MD 12/28/18 12:45 PM

## 2018-12-28 NOTE — Telephone Encounter (Signed)
Scheduled appt 11/18 los.  Spoke with pt and they are aware of the appt date and time.

## 2018-12-29 ENCOUNTER — Other Ambulatory Visit: Payer: Self-pay | Admitting: Oncology

## 2018-12-29 DIAGNOSIS — C9110 Chronic lymphocytic leukemia of B-cell type not having achieved remission: Secondary | ICD-10-CM

## 2018-12-30 MED FILL — IMBRUVICA 420 MG TAB: 420 | 28 days supply | Qty: 28 | Fill #0

## 2019-01-13 ENCOUNTER — Encounter (INDEPENDENT_AMBULATORY_CARE_PROVIDER_SITE_OTHER): Payer: 59 | Admitting: Ophthalmology

## 2019-01-13 DIAGNOSIS — H2513 Age-related nuclear cataract, bilateral: Secondary | ICD-10-CM

## 2019-01-13 DIAGNOSIS — E11319 Type 2 diabetes mellitus with unspecified diabetic retinopathy without macular edema: Secondary | ICD-10-CM

## 2019-01-13 DIAGNOSIS — E113593 Type 2 diabetes mellitus with proliferative diabetic retinopathy without macular edema, bilateral: Secondary | ICD-10-CM

## 2019-01-13 DIAGNOSIS — I10 Essential (primary) hypertension: Secondary | ICD-10-CM | POA: Diagnosis not present

## 2019-01-13 DIAGNOSIS — H35033 Hypertensive retinopathy, bilateral: Secondary | ICD-10-CM | POA: Diagnosis not present

## 2019-01-13 DIAGNOSIS — H43813 Vitreous degeneration, bilateral: Secondary | ICD-10-CM

## 2019-02-04 IMAGING — CT CT ABD-PELV W/ CM
3 of 9 series · 9 of 33 positions shown, 10 images · IV contrast (ISOVUE 300)
Comparison: 08/05/2015

CLINICAL DATA: Chronic lymphocytic leukemia.

EXAM:
CT CHEST, ABDOMEN, AND PELVIS WITH CONTRAST
TECHNIQUE: Multidetector CT imaging of the chest, abdomen and pelvis was
performed following the standard protocol during bolus
administration of intravenous contrast.
CONTRAST:  100mL 6NNJ9C-Y00 IOPAMIDOL (6NNJ9C-Y00) INJECTION 61%

[Series 3: cap with · axial · 0.81mm/px · z∈[-763,-763]mm · 1 of 127 slices shown, 2 images]
[im 64/127  soft-tissue]
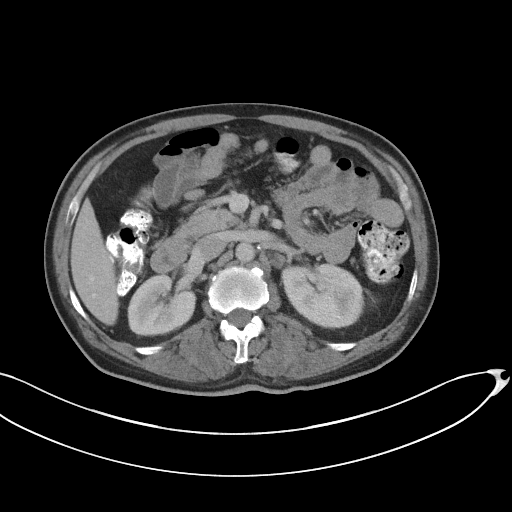
[im 64/127  bone]
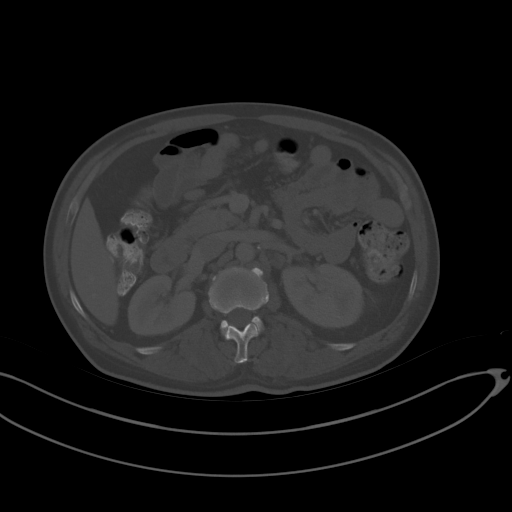

[Series 9: coronals · coronal · 0.74mm/px · 3 of 372 slices shown]
[im 93/372  bone]
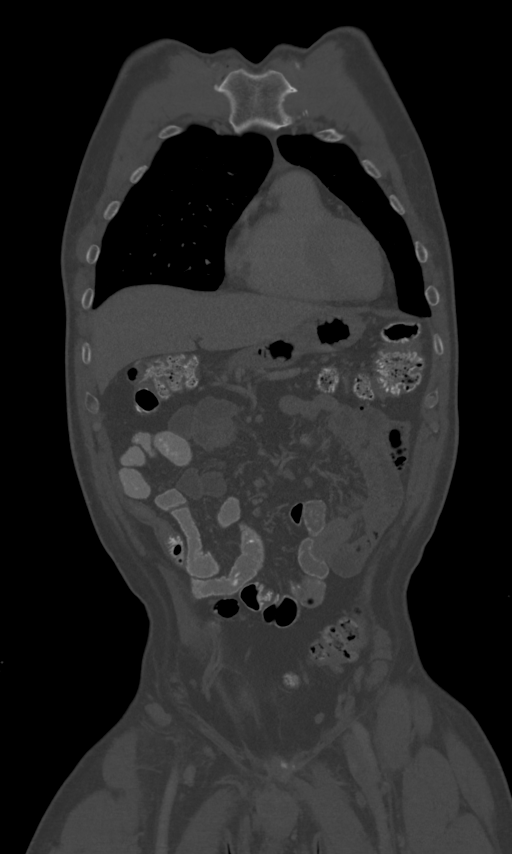
[im 186/372  bone]
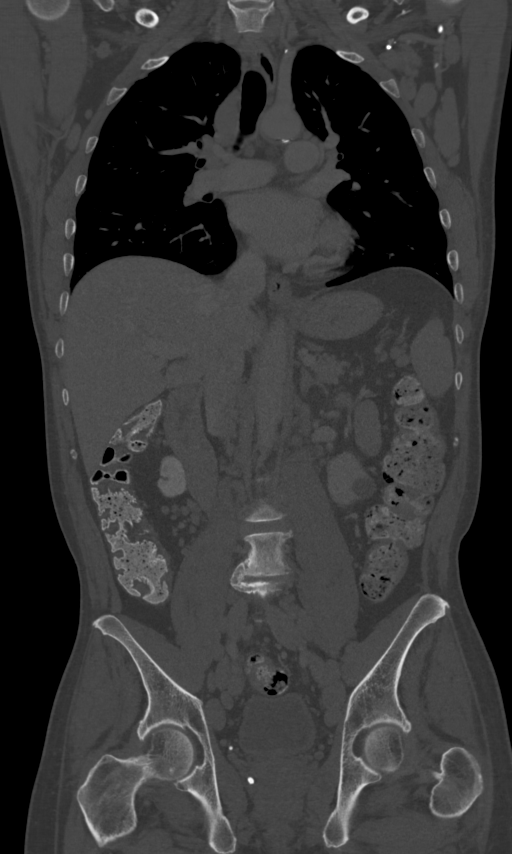
[im 279/372  bone]
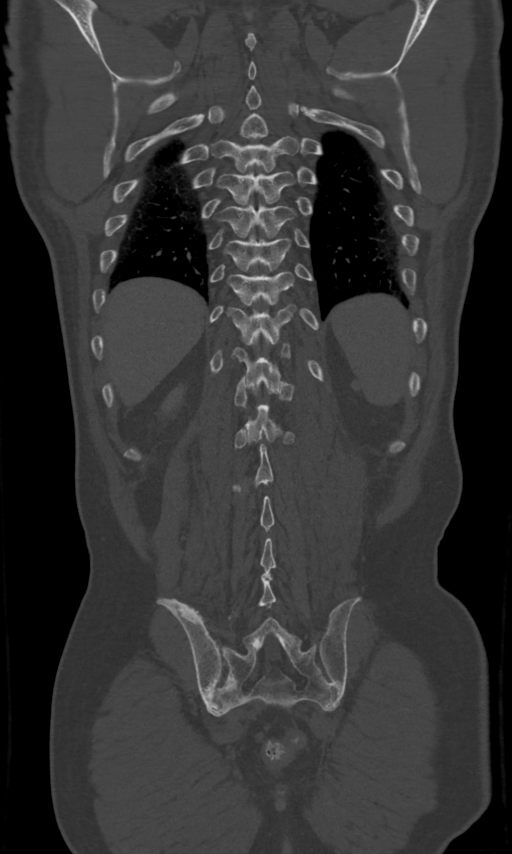

[Series 10: sagittals · sagittal · 0.53mm/px · 5 of 503 slices shown]
[im 151/503  bone]
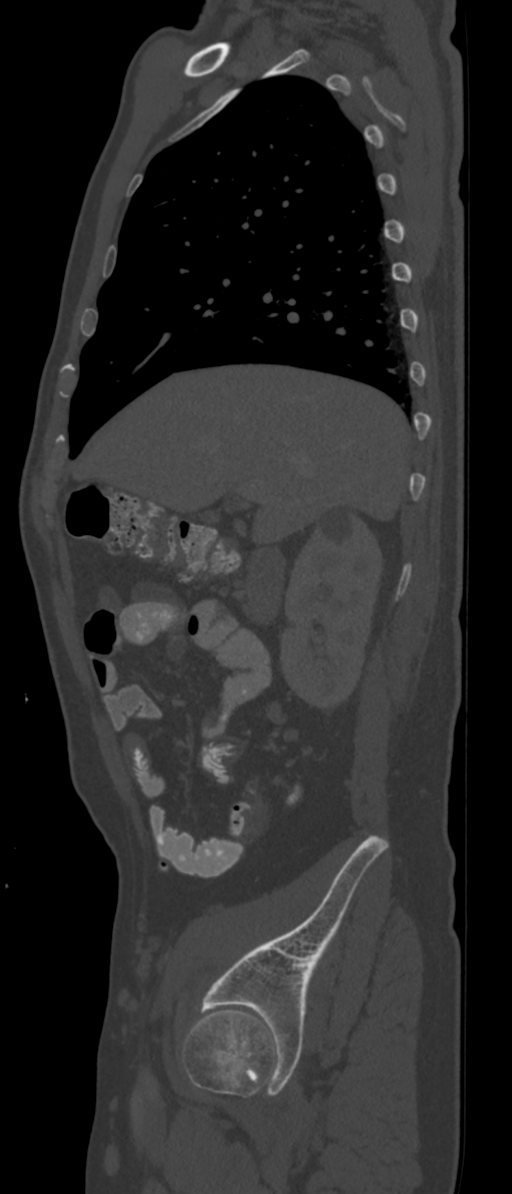
[im 201/503  bone]
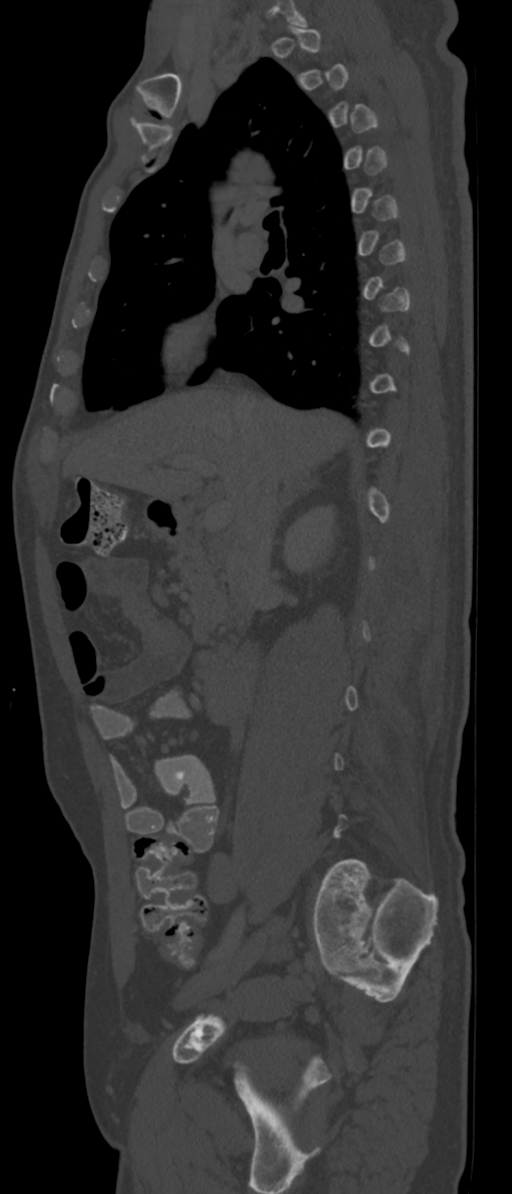
[im 252/503  bone]
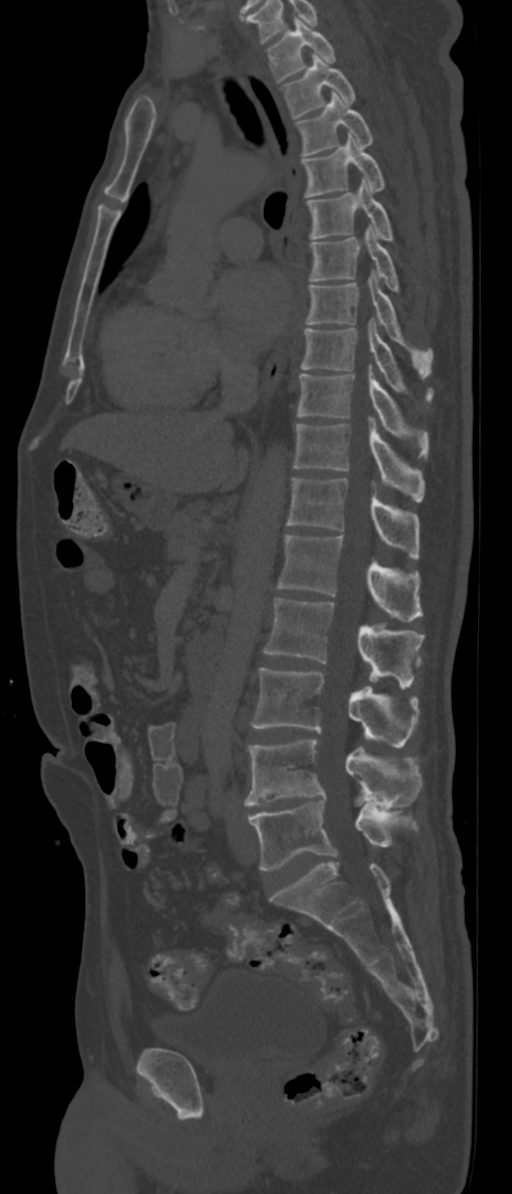
[im 302/503  bone]
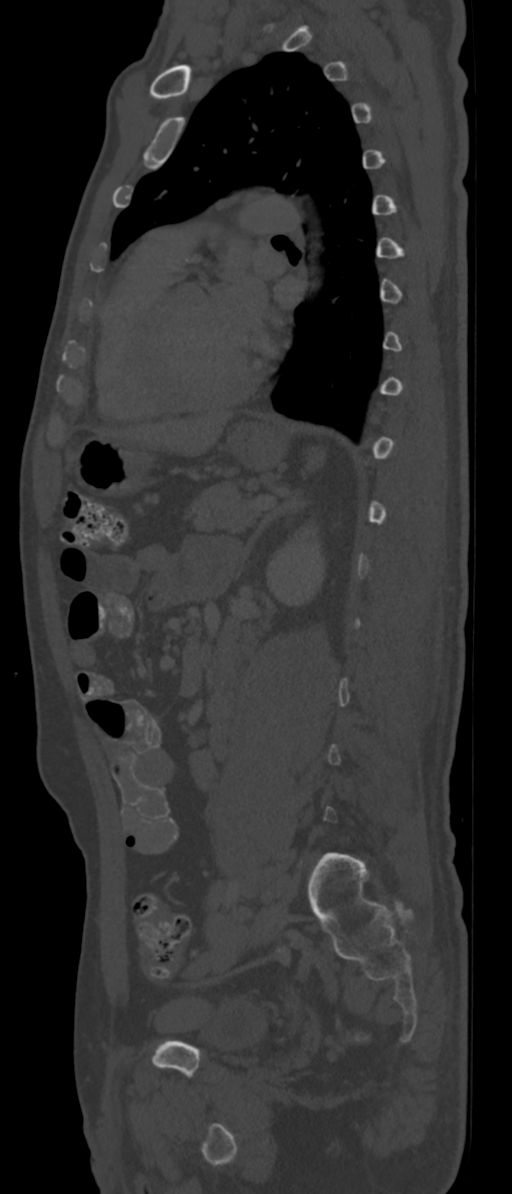
[im 352/503  bone]
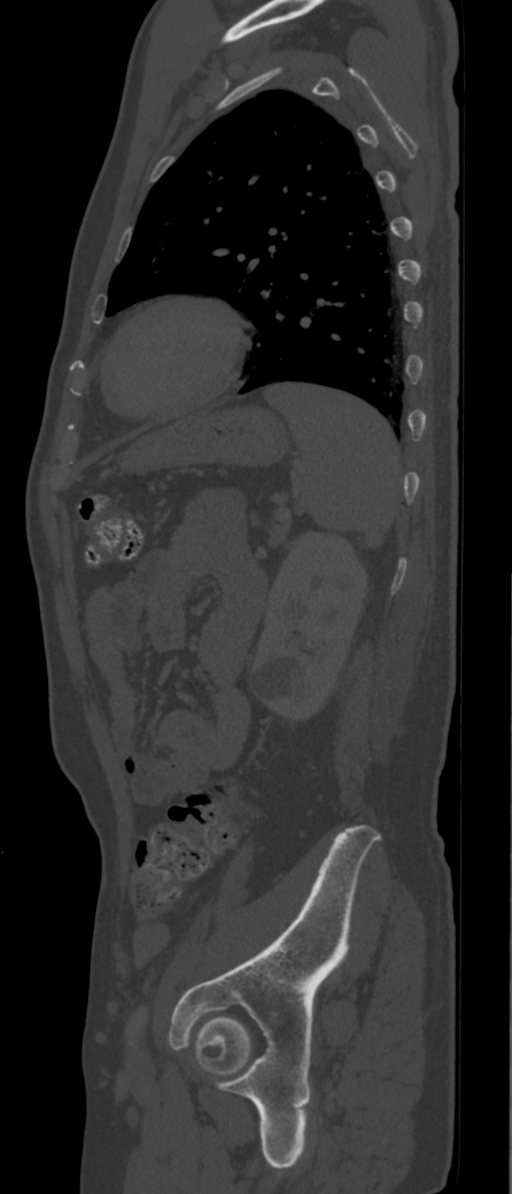

[9 of 33 positions shown; findings below may reference images not displayed]

FINDINGS: CT CHEST FINDINGS

Cardiovascular: Normal heart size. No pericardial effusion. Aortic
atherosclerosis. LAD coronary artery calcification noted.

Mediastinum/Nodes: Thoracic adenopathy is again identified. Left
supraclavicular lymph node measures 1.7 cm, image number 4 of series
3. Previously 2.2 cm. Index left axillary node measures 1.4 cm,
image 14 of series 3. Previously 1.7 cm. Index right axillary node
measures 1.1 cm, image 19 of series 3. Previously 1.5 cm. Index
right paratracheal node measures 1.6 cm, image number 17 of series
3. Previously 1.6 cm. Subcarinal lymph node measures 1.3 cm, image
number 28 of series 3. Previously 1.3 cm.

Lungs/Pleura: No pleural effusion identified. No pleural effusion
identified. No airspace consolidation or atelectasis.

Musculoskeletal: No aggressive lytic or sclerotic bone lesions.

CT ABDOMEN PELVIS FINDINGS

Hepatobiliary: No focal liver abnormality is seen. No gallstones,
gallbladder wall thickening, or biliary dilatation.

Pancreas: Unremarkable. No pancreatic ductal dilatation or
surrounding inflammatory changes.

Spleen: The spleen measures 5.3 by 10.2 by 10.8 cm (volume = 310
cm^3). No focal splenic abnormality.

Adrenals/Urinary Tract: The adrenal glands are normal. Bilateral
kidney cysts are identified. No mass or hydronephrosis identified.
Urinary bladder appears normal.

Stomach/Bowel: The stomach is normal. The small bowel loops have a
normal course and caliber. The appendix is visualized and appears
normal. Normal appearance of the colon.

Vascular/Lymphatic: Aortic atherosclerosis. No aneurysm. Abdominal
and pelvic adenopathy is again identified. Aortocaval node measures
1.4 cm, image 79 of series 3. Previously 1.4 cm. Left periaortic
node measures 2.5 cm, image 71 of series 3. Previously 2.3 cm. Right
common iliac node measures 11 mm, image 93 of series 3. Previously
this measured the same. Left pelvic sidewall node measures 1.8 cm,
image 104 of series 3. Previously 1.7 cm. Right pelvic sidewall node
measures 1.7 cm, image 107 of series 3. Previously 1.8 cm.

Reproductive: Prostate is unremarkable.

Other: No free fluid or fluid collections.

Musculoskeletal: No aggressive lytic or sclerotic bone lesions.
IMPRESSION: 1. Stable to mild improvement in thoracic adenopathy.
2. No significant change in the appearance of abdominal and pelvic
adenopathy.
3. Aortic Atherosclerosis ([id number]). LAD coronary artery
calcification.
4. Bilateral kidney cysts.

## 2019-02-13 ENCOUNTER — Telehealth: Payer: Self-pay | Admitting: Pharmacy Technician

## 2019-02-13 NOTE — Telephone Encounter (Signed)
Oral Oncology Patient Advocate Encounter   Patients current copay card required re-enrollment for 2021 calendar year.  Patient is re-enrolled with the [facility] card program for Imbruvica.  This copay card will make the patients copay $10.00.  I have spoken with the patient.    The billing information is as follows and has been shared with [facility].   RxBin: [id number] PCN: Loyalty Member ID: [id number] Group ID: [id number]   Tyler Scott Patient [place] Phone [phone] Fax [phone] 02/13/2019 9:49 AM

## 2019-02-15 ENCOUNTER — Other Ambulatory Visit: Payer: Self-pay | Admitting: Oncology

## 2019-02-15 DIAGNOSIS — C9110 Chronic lymphocytic leukemia of B-cell type not having achieved remission: Secondary | ICD-10-CM

## 2019-02-16 ENCOUNTER — Telehealth: Payer: Self-pay | Admitting: Oncology

## 2019-02-16 NOTE — Telephone Encounter (Signed)
Returned patient's phone call regarding rescheduling 01/20 appointment, per patient's request appointment has moved to 02/24.

## 2019-02-28 MED FILL — IMBRUVICA 420 MG TAB: 420 | 28 days supply | Qty: 28 | Fill #0

## 2019-03-01 ENCOUNTER — Ambulatory Visit: Payer: 59 | Admitting: Oncology

## 2019-03-01 ENCOUNTER — Other Ambulatory Visit: Payer: 59

## 2019-03-07 ENCOUNTER — Other Ambulatory Visit: Payer: Self-pay | Admitting: Student

## 2019-03-07 ENCOUNTER — Other Ambulatory Visit: Payer: Self-pay | Admitting: Physician Assistant

## 2019-03-07 DIAGNOSIS — M25461 Effusion, right knee: Secondary | ICD-10-CM

## 2019-03-07 DIAGNOSIS — M25561 Pain in right knee: Secondary | ICD-10-CM

## 2019-03-10 ENCOUNTER — Ambulatory Visit
Admission: RE | Admit: 2019-03-10 | Discharge: 2019-03-10 | Disposition: A | Discharge status: Home / Self Care | Payer: 59 | Source: Ambulatory Visit | Attending: Physician Assistant | Admitting: Physician Assistant

## 2019-03-10 DIAGNOSIS — M25561 Pain in right knee: Secondary | ICD-10-CM

## 2019-03-10 DIAGNOSIS — M25461 Effusion, right knee: Secondary | ICD-10-CM

## 2019-03-31 ENCOUNTER — Telehealth: Payer: Self-pay | Admitting: Oncology

## 2019-03-31 NOTE — Telephone Encounter (Signed)
Rescheduled per 2/18 sch msg, pt req. Called and spoke with pt, confirmed 3/3 appt

## 2019-04-05 ENCOUNTER — Other Ambulatory Visit: Payer: 59

## 2019-04-05 ENCOUNTER — Ambulatory Visit: Payer: 59 | Admitting: Oncology

## 2019-04-12 ENCOUNTER — Other Ambulatory Visit: Payer: 59

## 2019-04-12 ENCOUNTER — Ambulatory Visit: Payer: 59 | Admitting: Oncology

## 2019-04-14 ENCOUNTER — Telehealth: Payer: Self-pay | Admitting: Oncology

## 2019-04-14 NOTE — Telephone Encounter (Signed)
Rescheduled per 3/5 sch msg, pt req. Called and spoke with pt, confirmed 3/11 appt

## 2019-04-18 ENCOUNTER — Other Ambulatory Visit: Payer: Self-pay | Admitting: Oncology

## 2019-04-18 DIAGNOSIS — C9110 Chronic lymphocytic leukemia of B-cell type not having achieved remission: Secondary | ICD-10-CM

## 2019-04-19 MED FILL — IMBRUVICA 420 MG TAB: 420 | 28 days supply | Qty: 28 | Fill #0

## 2019-04-20 ENCOUNTER — Telehealth: Payer: Self-pay

## 2019-04-20 ENCOUNTER — Telehealth: Payer: Self-pay | Admitting: Oncology

## 2019-04-20 ENCOUNTER — Inpatient Hospital Stay: Payer: 59

## 2019-04-20 ENCOUNTER — Inpatient Hospital Stay: Payer: 59 | Attending: Oncology | Admitting: Oncology

## 2019-04-20 ENCOUNTER — Other Ambulatory Visit: Payer: Self-pay

## 2019-04-20 VITALS — BP 126/63 | HR 86 | Temp 98.2°F | Resp 18 | Wt 148.0 lb

## 2019-04-20 DIAGNOSIS — Z79899 Other long term (current) drug therapy: Secondary | ICD-10-CM | POA: Insufficient documentation

## 2019-04-20 DIAGNOSIS — E119 Type 2 diabetes mellitus without complications: Secondary | ICD-10-CM | POA: Insufficient documentation

## 2019-04-20 DIAGNOSIS — Z794 Long term (current) use of insulin: Secondary | ICD-10-CM | POA: Insufficient documentation

## 2019-04-20 DIAGNOSIS — C9110 Chronic lymphocytic leukemia of B-cell type not having achieved remission: Secondary | ICD-10-CM

## 2019-04-20 DIAGNOSIS — Z791 Long term (current) use of non-steroidal anti-inflammatories (NSAID): Secondary | ICD-10-CM | POA: Insufficient documentation

## 2019-04-20 DIAGNOSIS — C8581 Other specified types of non-Hodgkin lymphoma, lymph nodes of head, face, and neck: Secondary | ICD-10-CM | POA: Insufficient documentation

## 2019-04-20 DIAGNOSIS — Z9221 Personal history of antineoplastic chemotherapy: Secondary | ICD-10-CM | POA: Diagnosis not present

## 2019-04-20 LAB — CBC WITH DIFFERENTIAL (CANCER CENTER ONLY)
Abs Immature Granulocytes: 0.13 10*3/uL — ABNORMAL HIGH (ref 0.00–0.07)
Basophils Absolute: 0.1 10*3/uL (ref 0.0–0.1)
Basophils Relative: 0 %
Eosinophils Absolute: 0.1 10*3/uL (ref 0.0–0.5)
Eosinophils Relative: 0 %
HCT: 38 % — ABNORMAL LOW (ref 39.0–52.0)
Hemoglobin: 12.2 g/dL — ABNORMAL LOW (ref 13.0–17.0)
Immature Granulocytes: 0 %
Lymphocytes Relative: 81 %
Lymphs Abs: 52.2 10*3/uL — ABNORMAL HIGH (ref 0.7–4.0)
MCH: 31.5 pg (ref 26.0–34.0)
MCHC: 32.1 g/dL (ref 30.0–36.0)
MCV: 98.2 fL (ref 80.0–100.0)
Monocytes Absolute: 5.5 10*3/uL — ABNORMAL HIGH (ref 0.1–1.0)
Monocytes Relative: 9 %
Neutro Abs: 6.6 10*3/uL (ref 1.7–7.7)
Neutrophils Relative %: 10 %
Platelet Count: 220 10*3/uL (ref 150–400)
RBC: 3.87 MIL/uL — ABNORMAL LOW (ref 4.22–5.81)
RDW: 13.5 % (ref 11.5–15.5)
WBC Count: 64.5 10*3/uL (ref 4.0–10.5)
nRBC: 0 % (ref 0.0–0.2)

## 2019-04-20 LAB — CMP (CANCER CENTER ONLY)
ALT: 13 U/L (ref 0–44)
AST: 15 U/L (ref 15–41)
Albumin: 4.1 g/dL (ref 3.5–5.0)
Alkaline Phosphatase: 63 U/L (ref 38–126)
Anion gap: 9 (ref 5–15)
BUN: 16 mg/dL (ref 8–23)
CO2: 26 mmol/L (ref 22–32)
Calcium: 8.9 mg/dL (ref 8.9–10.3)
Chloride: 103 mmol/L (ref 98–111)
Creatinine: 1.11 mg/dL (ref 0.61–1.24)
GFR, Est AFR Am: >60 mL/min (ref >60)
GFR, Estimated: >60 mL/min (ref >60)
Glucose, Bld: 270 mg/dL — ABNORMAL HIGH (ref 70–99)
Potassium: 4.4 mmol/L (ref 3.5–5.1)
Sodium: 138 mmol/L (ref 135–145)
Total Bilirubin: 1.2 mg/dL (ref 0.3–1.2)
Total Protein: 6.5 g/dL (ref 6.5–8.1)

## 2019-04-20 NOTE — Telephone Encounter (Signed)
Scheduled appt per 3/11 los.  Sent a message to HIM pool to get a calendar mailed out.

## 2019-04-20 NOTE — Telephone Encounter (Signed)
Critical call from lab for patient WBC 64.5 Dr Alen Blew and Lanelle Bal RN made aware

## 2019-04-20 NOTE — Telephone Encounter (Signed)
Dr. Alen Blew made aware of critical WBC 64.5. No further orders received.

## 2019-04-20 NOTE — Progress Notes (Signed)
[place] OFFICE PROGRESS NOTE 04/20/19  Tyler Cookey, MD (Inactive) No address on file  DIAGNOSIS: 64 year old man with stage IIIa low-grade non-Hodgkin's lymphoma diagnosed in 2012.  He was found to have small lymphocytic lymphoma type.  Prior therapy:  Bendamustine and rituximab started in July 2017 and received total of 2 cycles. Therapy discontinued in August 2017 based on patient's wishes after achieving partial response.  He developed a relapse disease in April 2019.    CURRENT THERAPY: Ibrutinib 420 mg daily started in April 2019.  INTERVAL HISTORY: Tyler Scott is here for a follow-up visit.  Since the last visit, he reports no major changes in his health.  He continues to tolerate ibrutinib without any major complications.  He denies any nausea, vomiting or abdominal pain.  Denies any bleeding complications or bruising.  He denies any palpitation or tachycardia.  He continues to be in excellent health and performs activities of daily living without any decline.      ALLERGIES:  has No Known Allergies.  MEDICATIONS: Reviewed without changes.  Current Outpatient Medications on File Prior to Visit  Medication Sig Dispense Refill  . Blood Glucose Monitoring Suppl (ONE TOUCH ULTRA 2) W/DEVICE KIT See admin instructions.  0  . doxepin (SINEQUAN) 25 MG capsule     . IMBRUVICA 420 MG TABS TAKE 1 TABLET (420 MG) BY MOUTH DAILY. TAKE WITH A GLASS OF WATER THE SAME TIME EACH DAY. 28 tablet 0  . Insulin Glargine (LANTUS SOLOSTAR) 100 UNIT/ML Solostar Pen INJECT 20 UNITS SUBCUTANEOUSLY AT BEDTIME 3 pen 11  . lisinopril (PRINIVIL,ZESTRIL) 40 MG tablet Take 1 tablet (40 mg total) by mouth daily. 90 tablet 4  . meloxicam (MOBIC) 7.5 MG tablet TAKE 1 TABLET (7.5 MG TOTAL) BY MOUTH DAILY. 30 tablet 1  . ONE TOUCH ULTRA TEST test strip TEST GLUCOSE 3 TIMES A DAY 300 each 1  . ONETOUCH DELICA LANCETS 81W MISC TEST GLUCOSE 3 TIMES A DAY 300 each 1  . sildenafil  (REVATIO) 20 MG tablet Take 1 to 2 tabs 2 - 3 hours before sex 30 tablet 11  . simvastatin (ZOCOR) 20 MG tablet TAKE 1 TABLET (20 MG TOTAL) BY MOUTH AT BEDTIME. 100 tablet 4  . sitaGLIPtin (JANUVIA) 100 MG tablet Take 1 tablet (100 mg total) by mouth daily. 30 tablet 11   No current facility-administered medications on file prior to visit.      PHYSICAL EXAMINATION:   Blood pressure 126/63, pulse 86, temperature 98.2 F (36.8 C), temperature source Temporal, resp. rate 18, weight 148 lb (67.1 kg), SpO2 100 %.     ECOG  0     General appearance: Alert, awake without any distress. Head: Atraumatic without abnormalities Oropharynx: Without any thrush or ulcers. Eyes: No scleral icterus. Lymph nodes: No lymphadenopathy noted in the cervical, supraclavicular, or axillary nodes Heart:regular rate and rhythm, without any murmurs or gallops.   Lung: Clear to auscultation without any rhonchi, wheezes or dullness to percussion. Abdomin: Soft, nontender without any shifting dullness or ascites. Musculoskeletal: No clubbing or cyanosis. Neurological: No motor or sensory deficits. Skin: No rashes or lesions.       Labs:  Lab Results  Component Value Date   WBC 115.2 (HH) 12/28/2018   HGB 13.0 12/28/2018   HCT 41.5 12/28/2018   MCV 99.0 12/28/2018   PLT 230 12/28/2018   NEUTROABS 5.4 12/28/2018      Chemistry      Component Value Date/Time  NA 138 12/28/2018 1231   NA 142 01/01/2017 0809   K 4.6 12/28/2018 1231   K 4.4 01/01/2017 0809   CL 102 12/28/2018 1231   CL 102 04/25/2012 1003   CO2 24 12/28/2018 1231   CO2 26 01/01/2017 0809   BUN 14 12/28/2018 1231   BUN 15.7 01/01/2017 0809   CREATININE 1.11 12/28/2018 1231   CREATININE 0.9 01/01/2017 0809      Component Value Date/Time   CALCIUM 9.2 12/28/2018 1231   CALCIUM 9.1 01/01/2017 0809   ALKPHOS 63 12/28/2018 1231   ALKPHOS 68 01/01/2017 0809   AST 17 12/28/2018 1231   AST 22 01/01/2017 0809   ALT 13  12/28/2018 1231   ALT 19 01/01/2017 0809   BILITOT 1.2 12/28/2018 1231   BILITOT 0.75 01/01/2017 0809        Assessment and plan:  64 year old man with:    1.  Stage III low-grade non-Hodgkin's lymphoma (SLL) presented with lymphocytosis and adenopathy.  He is currently on ibrutinib which was restarted in May 2019.  Risks and benefits of continued therapy at this time was reviewed.  Potential complications including bleeding, arrhythmias cardiac complications were discussed.  Alternative options would be systemic chemotherapy at this time.  After discussion today, we opted to continue with the same dose and schedule at this time.  His white cell count continues to improve in the last 3 months.  Does not have any painful adenopathy or constitutional symptoms at this time.    2.  Cardiovascular complications: No issues reported.  I continue to educate him about potential complications related to this medication.  3.  Diabetes: No recent exacerbation noted at this time..  4.  Bleeding complications: No bleeding issues noted.  5. Follow-up: In 3 months for repeat evaluation.   30  minutes were dedicated to this visit. The time was spent on reviewing laboratory data, discussing treatment options, and answering questions regarding future plan.    [name], MD 04/20/19 9:45 AM

## 2019-05-08 ENCOUNTER — Other Ambulatory Visit: Payer: Self-pay | Admitting: Oncology

## 2019-05-08 DIAGNOSIS — C9110 Chronic lymphocytic leukemia of B-cell type not having achieved remission: Secondary | ICD-10-CM

## 2019-05-10 MED FILL — IMBRUVICA 420 MG TAB: 420 | 28 days supply | Qty: 28 | Fill #0

## 2019-06-14 ENCOUNTER — Other Ambulatory Visit: Payer: Self-pay | Admitting: Oncology

## 2019-06-14 DIAGNOSIS — C9110 Chronic lymphocytic leukemia of B-cell type not having achieved remission: Secondary | ICD-10-CM

## 2019-07-12 ENCOUNTER — Other Ambulatory Visit: Payer: Self-pay | Admitting: Oncology

## 2019-07-12 DIAGNOSIS — C9110 Chronic lymphocytic leukemia of B-cell type not having achieved remission: Secondary | ICD-10-CM

## 2019-07-25 ENCOUNTER — Other Ambulatory Visit: Payer: Self-pay

## 2019-07-25 ENCOUNTER — Telehealth: Payer: Self-pay

## 2019-07-25 ENCOUNTER — Inpatient Hospital Stay: Payer: Medicare Other

## 2019-07-25 ENCOUNTER — Inpatient Hospital Stay: Payer: Medicare Other | Attending: Oncology | Admitting: Oncology

## 2019-07-25 VITALS — BP 128/58 | HR 91 | Temp 97.9°F | Resp 17 | Ht 68.5 in | Wt 149.1 lb

## 2019-07-25 DIAGNOSIS — E119 Type 2 diabetes mellitus without complications: Secondary | ICD-10-CM | POA: Insufficient documentation

## 2019-07-25 DIAGNOSIS — Z79899 Other long term (current) drug therapy: Secondary | ICD-10-CM | POA: Insufficient documentation

## 2019-07-25 DIAGNOSIS — Z794 Long term (current) use of insulin: Secondary | ICD-10-CM | POA: Insufficient documentation

## 2019-07-25 DIAGNOSIS — Z791 Long term (current) use of non-steroidal anti-inflammatories (NSAID): Secondary | ICD-10-CM | POA: Insufficient documentation

## 2019-07-25 DIAGNOSIS — D72820 Lymphocytosis (symptomatic): Secondary | ICD-10-CM | POA: Diagnosis not present

## 2019-07-25 DIAGNOSIS — C9110 Chronic lymphocytic leukemia of B-cell type not having achieved remission: Secondary | ICD-10-CM

## 2019-07-25 DIAGNOSIS — C8581 Other specified types of non-Hodgkin lymphoma, lymph nodes of head, face, and neck: Secondary | ICD-10-CM | POA: Diagnosis not present

## 2019-07-25 DIAGNOSIS — Z9221 Personal history of antineoplastic chemotherapy: Secondary | ICD-10-CM | POA: Diagnosis not present

## 2019-07-25 LAB — CBC WITH DIFFERENTIAL (CANCER CENTER ONLY)
Abs Immature Granulocytes: 0.17 10*3/uL — ABNORMAL HIGH (ref 0.00–0.07)
Basophils Absolute: 0.1 10*3/uL (ref 0.0–0.1)
Basophils Relative: 0 %
Eosinophils Absolute: 0.2 10*3/uL (ref 0.0–0.5)
Eosinophils Relative: 0 %
HCT: 41.9 % (ref 39.0–52.0)
Hemoglobin: 13.5 g/dL (ref 13.0–17.0)
Immature Granulocytes: 0 %
Lymphocytes Relative: 85 %
Lymphs Abs: 78.1 10*3/uL — ABNORMAL HIGH (ref 0.7–4.0)
MCH: 31.8 pg (ref 26.0–34.0)
MCHC: 32.2 g/dL (ref 30.0–36.0)
MCV: 98.8 fL (ref 80.0–100.0)
Monocytes Absolute: 8 10*3/uL — ABNORMAL HIGH (ref 0.1–1.0)
Monocytes Relative: 9 %
Neutro Abs: 5.9 10*3/uL (ref 1.7–7.7)
Neutrophils Relative %: 6 %
Platelet Count: 248 10*3/uL (ref 150–400)
RBC: 4.24 MIL/uL (ref 4.22–5.81)
RDW: 13.7 % (ref 11.5–15.5)
WBC Count: 92.4 10*3/uL (ref 4.0–10.5)
nRBC: 0 % (ref 0.0–0.2)

## 2019-07-25 LAB — CMP (CANCER CENTER ONLY)
ALT: 16 U/L (ref 0–44)
AST: 21 U/L (ref 15–41)
Albumin: 4.2 g/dL (ref 3.5–5.0)
Alkaline Phosphatase: 66 U/L (ref 38–126)
Anion gap: 9 (ref 5–15)
BUN: 13 mg/dL (ref 8–23)
CO2: 26 mmol/L (ref 22–32)
Calcium: 9 mg/dL (ref 8.9–10.3)
Chloride: 105 mmol/L (ref 98–111)
Creatinine: 1.06 mg/dL (ref 0.61–1.24)
GFR, Est AFR Am: >60 mL/min (ref >60)
GFR, Estimated: >60 mL/min (ref >60)
Glucose, Bld: 89 mg/dL (ref 70–99)
Potassium: 4.2 mmol/L (ref 3.5–5.1)
Sodium: 140 mmol/L (ref 135–145)
Total Bilirubin: 1 mg/dL (ref 0.3–1.2)
Total Protein: 6.9 g/dL (ref 6.5–8.1)

## 2019-07-25 NOTE — Progress Notes (Signed)
[place] OFFICE PROGRESS NOTE 07/25/19  Tyler Cookey, MD (Inactive) No address on file  DIAGNOSIS: 64 year old man with small lymphocytic lymphoma diagnosed in 2012.  He was found to have stage IIIa disease.  Prior therapy:  Bendamustine and rituximab started in July 2017 and received total of 2 cycles. Therapy discontinued in August 2017 based on patient's wishes after achieving partial response.  He developed a relapse disease in April 2019.    CURRENT THERAPY: Ibrutinib 420 mg daily started in April 2019.  INTERVAL HISTORY: Tyler Scott is here for repeat evaluation.  Since the last visit, he reports no major changes in his health.  He continues to take ibrutinib although has not been consistent in taking it daily.  He does report some occasional fatigue and tiredness associated with it but no other complaints at this time.  He denies any nausea, vomiting or abdominal pain.  He denies any palpitation or bleeding issues.      ALLERGIES:  has No Known Allergies.  MEDICATIONS: Updated on review.  Current Outpatient Medications on File Prior to Visit  Medication Sig Dispense Refill  . Blood Glucose Monitoring Suppl (ONE TOUCH ULTRA 2) W/DEVICE KIT See admin instructions.  0  . doxepin (SINEQUAN) 25 MG capsule     . IMBRUVICA 420 MG TABS TAKE 1 TABLET (420 MG) BY MOUTH DAILY. TAKE WITH A GLASS OF WATER THE SAME TIME EACH DAY. 28 tablet 0  . Insulin Glargine (LANTUS SOLOSTAR) 100 UNIT/ML Solostar Pen INJECT 20 UNITS SUBCUTANEOUSLY AT BEDTIME 3 pen 11  . lisinopril (PRINIVIL,ZESTRIL) 40 MG tablet Take 1 tablet (40 mg total) by mouth daily. 90 tablet 4  . meloxicam (MOBIC) 7.5 MG tablet TAKE 1 TABLET (7.5 MG TOTAL) BY MOUTH DAILY. 30 tablet 1  . ONE TOUCH ULTRA TEST test strip TEST GLUCOSE 3 TIMES A DAY 300 each 1  . ONETOUCH DELICA LANCETS 51O MISC TEST GLUCOSE 3 TIMES A DAY 300 each 1  . sildenafil (REVATIO) 20 MG tablet Take 1 to 2 tabs 2 - 3 hours before sex  30 tablet 11  . simvastatin (ZOCOR) 20 MG tablet TAKE 1 TABLET (20 MG TOTAL) BY MOUTH AT BEDTIME. 100 tablet 4  . sitaGLIPtin (JANUVIA) 100 MG tablet Take 1 tablet (100 mg total) by mouth daily. 30 tablet 11   No current facility-administered medications on file prior to visit.      PHYSICAL EXAMINATION:    Blood pressure (!) 128/58, pulse 91, temperature 97.9 F (36.6 C), temperature source Temporal, resp. rate 17, height 5' 8.5" (1.74 m), weight 149 lb 1.6 oz (67.6 kg), SpO2 100 %.     ECOG  0     General appearance: Comfortable appearing without any discomfort Head: Normocephalic without any trauma Oropharynx: Mucous membranes are moist and pink without any thrush or ulcers. Eyes: Pupils are equal and round reactive to light. Lymph nodes: No cervical, supraclavicular, inguinal or axillary lymphadenopathy.   Heart:regular rate and rhythm.  S1 and S2 without leg edema. Lung: Clear without any rhonchi or wheezes.  No dullness to percussion. Abdomin: Soft, nontender, nondistended with good bowel sounds.  No hepatosplenomegaly. Musculoskeletal: No joint deformity or effusion.  Full range of motion noted. Neurological: No deficits noted on motor, sensory and deep tendon reflex exam. Skin: No petechial rash or dryness.  Appeared moist.  .        Labs:  Lab Results  Component Value Date   WBC 64.5 (HH) 04/20/2019  HGB 12.2 (L) 04/20/2019   HCT 38.0 (L) 04/20/2019   MCV 98.2 04/20/2019   PLT 220 04/20/2019   NEUTROABS 6.6 04/20/2019      Chemistry      Component Value Date/Time   NA 138 04/20/2019 0939   NA 142 01/01/2017 0809   K 4.4 04/20/2019 0939   K 4.4 01/01/2017 0809   CL 103 04/20/2019 0939   CL 102 04/25/2012 1003   CO2 26 04/20/2019 0939   CO2 26 01/01/2017 0809   BUN 16 04/20/2019 0939   BUN 15.7 01/01/2017 0809   CREATININE 1.11 04/20/2019 0939   CREATININE 0.9 01/01/2017 0809      Component Value Date/Time   CALCIUM 8.9 04/20/2019 0939    CALCIUM 9.1 01/01/2017 0809   ALKPHOS 63 04/20/2019 0939   ALKPHOS 68 01/01/2017 0809   AST 15 04/20/2019 0939   AST 22 01/01/2017 0809   ALT 13 04/20/2019 0939   ALT 19 01/01/2017 0809   BILITOT 1.2 04/20/2019 0939   BILITOT 0.75 01/01/2017 0809        Assessment and plan:  64 year old man with:    1.  Small lymphocytic lymphoma diagnosed in 2012.  He was found to have stage IIIa disease in addition to lymphocytosis.     The natural course of his disease and treatment options were discussed at this time.  He is status post therapy outlined above currently on ibrutinib.  Risks and benefits of continuing this therapy and potential complications were reviewed.  Alternative option would be returning to systemic chemotherapy utilizing bendamustine and rituximab or fludarabine, cyclophosphamide and rituximab.  Risks and benefits of both approaches were reviewed and risks associated with chemotherapy including immune compromise in the context of COVID-19 pandemic.  Despite his previous vaccination is possible that his immune response to possible infection could be compromised by specifically rituximab which will impair his humoral immunity.  After discussion today, I recommended continuing ibrutinib and I continue to emphasize the importance of adherence.  He is agreeable at this time.   2.  Cardiovascular complications: Potential complications such as cardiac arrhythmias were reviewed.  He is time.  3.  Diabetes: Blood sugar remains under control for the time being.   4.  Bleeding complications: This is related to ibrutinib and I continue to educate him about potential bleeding issues.  He did include cutaneous bleeding as well as GI bleeding.  6. Follow-up: In 3 months for repeat evaluation.   30  minutes were dedicated to this encounter.  The time was spent on updating his laboratory data, discussing treatment options and addressing potential complications related to his current  therapy.    [name], MD 07/25/19 9:47 AM

## 2019-07-25 NOTE — Telephone Encounter (Signed)
Received a call from Dorian Furnace in the lab at 0950 to report WBC 92.4. Dr. Alen Blew made aware at [phone]. No further instructions received at this time.

## 2019-07-26 ENCOUNTER — Telehealth: Payer: Self-pay | Admitting: Oncology

## 2019-07-26 MED FILL — IMBRUVICA 420 MG TAB: 420 | 28 days supply | Qty: 28 | Fill #0

## 2019-07-26 NOTE — Telephone Encounter (Signed)
Scheduled appt per 6/15 los.  Spoke with pt and they are aware of the appt date and time. 

## 2019-08-16 ENCOUNTER — Other Ambulatory Visit: Payer: Self-pay | Admitting: Oncology

## 2019-08-16 DIAGNOSIS — C9110 Chronic lymphocytic leukemia of B-cell type not having achieved remission: Secondary | ICD-10-CM

## 2019-08-22 MED FILL — IMBRUVICA 420 MG TAB: 420 | 28 days supply | Qty: 28 | Fill #0

## 2019-09-14 ENCOUNTER — Other Ambulatory Visit: Payer: Self-pay | Admitting: Oncology

## 2019-09-14 DIAGNOSIS — C9110 Chronic lymphocytic leukemia of B-cell type not having achieved remission: Secondary | ICD-10-CM

## 2019-09-18 MED FILL — IMBRUVICA 420 MG TAB: 420 | 28 days supply | Qty: 28 | Fill #0

## 2019-10-11 ENCOUNTER — Other Ambulatory Visit: Payer: Self-pay | Admitting: Oncology

## 2019-10-11 DIAGNOSIS — C9110 Chronic lymphocytic leukemia of B-cell type not having achieved remission: Secondary | ICD-10-CM

## 2019-10-23 ENCOUNTER — Telehealth: Payer: Self-pay | Admitting: Oncology

## 2019-10-23 NOTE — Telephone Encounter (Signed)
Called pt per 9/13 sch msg - left message for patient to call back to reschedule appt.

## 2019-10-25 ENCOUNTER — Ambulatory Visit: Payer: 59 | Admitting: Oncology

## 2019-10-25 ENCOUNTER — Other Ambulatory Visit: Payer: 59

## 2019-10-30 MED FILL — IMBRUVICA 420 MG TAB: 420 | 28 days supply | Qty: 28 | Fill #0

## 2019-11-17 ENCOUNTER — Telehealth: Payer: Self-pay | Admitting: Oncology

## 2019-11-17 NOTE — Telephone Encounter (Signed)
Per sch msg, patient wanted to r/s 10/20 appt. Called and spoke with patient. Confirmed new date and time

## 2019-11-29 ENCOUNTER — Ambulatory Visit: Payer: 59 | Admitting: Oncology

## 2019-11-29 ENCOUNTER — Other Ambulatory Visit: Payer: 59

## 2019-12-19 ENCOUNTER — Inpatient Hospital Stay: Payer: Medicare Other | Attending: Oncology

## 2019-12-19 ENCOUNTER — Inpatient Hospital Stay (HOSPITAL_BASED_OUTPATIENT_CLINIC_OR_DEPARTMENT_OTHER): Payer: Medicare Other | Admitting: Oncology

## 2019-12-19 ENCOUNTER — Other Ambulatory Visit: Payer: Self-pay

## 2019-12-19 ENCOUNTER — Telehealth: Payer: Self-pay

## 2019-12-19 VITALS — BP 159/79 | HR 88 | Temp 96.6°F | Resp 18 | Ht 68.5 in | Wt 152.5 lb

## 2019-12-19 DIAGNOSIS — Z79899 Other long term (current) drug therapy: Secondary | ICD-10-CM | POA: Insufficient documentation

## 2019-12-19 DIAGNOSIS — R5383 Other fatigue: Secondary | ICD-10-CM | POA: Insufficient documentation

## 2019-12-19 DIAGNOSIS — C9110 Chronic lymphocytic leukemia of B-cell type not having achieved remission: Secondary | ICD-10-CM | POA: Diagnosis present

## 2019-12-19 LAB — CMP (CANCER CENTER ONLY)
ALT: 15 U/L (ref 0–44)
AST: 23 U/L (ref 15–41)
Albumin: 3.9 g/dL (ref 3.5–5.0)
Alkaline Phosphatase: 66 U/L (ref 38–126)
Anion gap: 6 (ref 5–15)
BUN: 17 mg/dL (ref 8–23)
CO2: 27 mmol/L (ref 22–32)
Calcium: 8.7 mg/dL — ABNORMAL LOW (ref 8.9–10.3)
Chloride: 107 mmol/L (ref 98–111)
Creatinine: 1.12 mg/dL (ref 0.61–1.24)
GFR, Estimated: >60 mL/min (ref >60)
Glucose, Bld: 151 mg/dL — ABNORMAL HIGH (ref 70–99)
Potassium: 4.4 mmol/L (ref 3.5–5.1)
Sodium: 140 mmol/L (ref 135–145)
Total Bilirubin: 1.2 mg/dL (ref 0.3–1.2)
Total Protein: 6.4 g/dL — ABNORMAL LOW (ref 6.5–8.1)

## 2019-12-19 LAB — CBC WITH DIFFERENTIAL (CANCER CENTER ONLY)
Abs Immature Granulocytes: 0.17 10*3/uL — ABNORMAL HIGH (ref 0.00–0.07)
Basophils Absolute: 0.1 10*3/uL (ref 0.0–0.1)
Basophils Relative: 0 %
Eosinophils Absolute: 0.2 10*3/uL (ref 0.0–0.5)
Eosinophils Relative: 0 %
HCT: 41.5 % (ref 39.0–52.0)
Hemoglobin: 13.3 g/dL (ref 13.0–17.0)
Immature Granulocytes: 0 %
Lymphocytes Relative: 89 %
Lymphs Abs: 83.2 10*3/uL — ABNORMAL HIGH (ref 0.7–4.0)
MCH: 30.4 pg (ref 26.0–34.0)
MCHC: 32 g/dL (ref 30.0–36.0)
MCV: 94.7 fL (ref 80.0–100.0)
Monocytes Absolute: 4.4 10*3/uL — ABNORMAL HIGH (ref 0.1–1.0)
Monocytes Relative: 5 %
Neutro Abs: 6 10*3/uL (ref 1.7–7.7)
Neutrophils Relative %: 6 %
Platelet Count: 200 10*3/uL (ref 150–400)
RBC: 4.38 MIL/uL (ref 4.22–5.81)
RDW: 14.1 % (ref 11.5–15.5)
WBC Count: 94 10*3/uL (ref 4.0–10.5)
nRBC: 0 % (ref 0.0–0.2)

## 2019-12-19 NOTE — Progress Notes (Signed)
[facility] OFFICE PROGRESS NOTE 12/19/19  Tyler Pee, MD (Inactive) No address on file  DIAGNOSIS: 64 year old man with CLL diagnosed in 2012. He was found to have stage IIIa with small lymphocytic lymphoma at that time.  Prior therapy:  Bendamustine and rituximab started in July 2017 and received total of 2 cycles. Therapy discontinued in August 2017 based on patient's wishes after achieving partial response.  He developed a relapse disease in April 2019.    CURRENT THERAPY: Ibrutinib 420 mg daily started in April 2019.  INTERVAL HISTORY: Tyler Scott returns today for a follow-up visit. Since the last visit, he reports no major changes in his health.  He continues to tolerate ibrutinib although he has not been consistent in taking it.  He denies any nausea or vomiting or abdominal pain.  He does report some fatigue occasional tiredness associated with it.  He did have also bruising on his lower extremity.  His performance status quality of life remains excellent.      ALLERGIES:  has No Known Allergies.  MEDICATIONS: reviewed without changes.  Current Outpatient Medications on File Prior to Visit  Medication Sig Dispense Refill  . IMBRUVICA 420 MG TABS TAKE 1 TABLET (420 MG) BY MOUTH DAILY. TAKE WITH A GLASS OF WATER THE SAME TIME EACH DAY. 28 tablet 0  . Blood Glucose Monitoring Suppl (ONE TOUCH ULTRA 2) W/DEVICE KIT See admin instructions.  0  . doxepin (SINEQUAN) 25 MG capsule     . Insulin Glargine (LANTUS SOLOSTAR) 100 UNIT/ML Solostar Pen INJECT 20 UNITS SUBCUTANEOUSLY AT BEDTIME 3 pen 11  . lisinopril (PRINIVIL,ZESTRIL) 40 MG tablet Take 1 tablet (40 mg total) by mouth daily. 90 tablet 4  . meloxicam (MOBIC) 7.5 MG tablet TAKE 1 TABLET (7.5 MG TOTAL) BY MOUTH DAILY. 30 tablet 1  . ONE TOUCH ULTRA TEST test strip TEST GLUCOSE 3 TIMES A DAY 300 each 1  . ONETOUCH DELICA LANCETS 33G MISC TEST GLUCOSE 3 TIMES A DAY 300 each 1  . sildenafil (REVATIO) 20  MG tablet Take 1 to 2 tabs 2 - 3 hours before sex 30 tablet 11  . simvastatin (ZOCOR) 20 MG tablet TAKE 1 TABLET (20 MG TOTAL) BY MOUTH AT BEDTIME. 100 tablet 4  . sitaGLIPtin (JANUVIA) 100 MG tablet Take 1 tablet (100 mg total) by mouth daily. 30 tablet 11   No current facility-administered medications on file prior to visit.      PHYSICAL EXAMINATION:      Blood pressure (!) 159/79, pulse 88, temperature (!) 96.6 F (35.9 C), temperature source Tympanic, resp. rate 18, height 5' 8.5" (1.74 m), weight 152 lb 8 oz (69.2 kg), SpO2 100 %.   ECOG  0   General appearance: Alert, awake without any distress. Head: Atraumatic without abnormalities Oropharynx: Without any thrush or ulcers. Eyes: No scleral icterus. Lymph nodes: No lymphadenopathy noted in the cervical, supraclavicular, or axillary nodes Heart:regular rate and rhythm, without any murmurs or gallops.   Lung: Clear to auscultation without any rhonchi, wheezes or dullness to percussion. Abdomin: Soft, nontender without any shifting dullness or ascites. Musculoskeletal: No clubbing or cyanosis. Neurological: No motor or sensory deficits. Skin: No rashes or lesions.         Labs:  Lab Results  Component Value Date   WBC 92.4 (HH) 07/25/2019   HGB 13.5 07/25/2019   HCT 41.9 07/25/2019   MCV 98.8 07/25/2019   PLT 248 07/25/2019   NEUTROABS 5.9 07/25/2019  Chemistry      Component Value Date/Time   NA 140 07/25/2019 0932   NA 142 01/01/2017 0809   K 4.2 07/25/2019 0932   K 4.4 01/01/2017 0809   CL 105 07/25/2019 0932   CL 102 04/25/2012 1003   CO2 26 07/25/2019 0932   CO2 26 01/01/2017 0809   BUN 13 07/25/2019 0932   BUN 15.7 01/01/2017 0809   CREATININE 1.06 07/25/2019 0932   CREATININE 0.9 01/01/2017 0809      Component Value Date/Time   CALCIUM 9.0 07/25/2019 0932   CALCIUM 9.1 01/01/2017 0809   ALKPHOS 66 07/25/2019 0932   ALKPHOS 68 01/01/2017 0809   AST 21 07/25/2019 0932   AST 22  01/01/2017 0809   ALT 16 07/25/2019 0932   ALT 19 01/01/2017 0809   BILITOT 1.0 07/25/2019 0932   BILITOT 0.75 01/01/2017 0809        Assessment and plan:  64 year old man with:    1.  Stage IIIa Small lymphocytic lymphoma diagnosed in 2012.    He is currently on ibrutinib which she has tolerated well although has not been consistent in taking it.  Risks and benefits of continuing this medication versus switching to alternative oral targeted therapy or systemic chemotherapy were reviewed.  Potential complications all of these approaches were reiterated at this time.  After discussion today, he agreed to continue with ibrutinib with more adherence to this medication.  We will continue to monitor closely at this time.  He is benefiting clinically without any symptomatic lymphadenopathy although his laboratory data showed consistently high elevation in his white cell count.   2.  Cardiovascular complications: No complications are noted at this time.  I continue to educate him about potential arrhythmias among others.  3.  Bleeding complications: No bleeding noted at this time although he has occasional bruising.  4. Follow-up: He will return in 4 months for repeat follow-up.   30  minutes were spent on this visit.  The time was dedicated to updating his disease status, discussing treatment options and future plan of care review.    [name], MD 12/19/19 9:24 AM

## 2019-12-19 NOTE — Telephone Encounter (Signed)
CRITICAL VALUE STICKER  CRITICAL VALUE: WBC 94.0  RECEIVER (on-site recipient of call): Wendall Mola, RN  [place] NOTIFIED: 12/19/2019 @ 0948  MESSENGER (representative from lab): Anda Latina  MD NOTIFIED: Dr. [name]  TIME OF NOTIFICATION: [phone]  RESPONSE: Acknowledged.

## 2019-12-25 ENCOUNTER — Other Ambulatory Visit: Payer: Self-pay | Admitting: Oncology

## 2019-12-25 DIAGNOSIS — C9110 Chronic lymphocytic leukemia of B-cell type not having achieved remission: Secondary | ICD-10-CM

## 2019-12-28 MED FILL — IMBRUVICA 420 MG TAB: 420 | 28 days supply | Qty: 28 | Fill #0

## 2019-12-29 ENCOUNTER — Encounter (INDEPENDENT_AMBULATORY_CARE_PROVIDER_SITE_OTHER): Payer: Medicare Other | Admitting: Ophthalmology

## 2019-12-29 ENCOUNTER — Other Ambulatory Visit: Payer: Self-pay

## 2019-12-29 DIAGNOSIS — I10 Essential (primary) hypertension: Secondary | ICD-10-CM

## 2019-12-29 DIAGNOSIS — E11319 Type 2 diabetes mellitus with unspecified diabetic retinopathy without macular edema: Secondary | ICD-10-CM

## 2019-12-29 DIAGNOSIS — E113513 Type 2 diabetes mellitus with proliferative diabetic retinopathy with macular edema, bilateral: Secondary | ICD-10-CM | POA: Diagnosis not present

## 2019-12-29 DIAGNOSIS — H35033 Hypertensive retinopathy, bilateral: Secondary | ICD-10-CM | POA: Diagnosis not present

## 2019-12-29 DIAGNOSIS — H43813 Vitreous degeneration, bilateral: Secondary | ICD-10-CM

## 2020-01-15 ENCOUNTER — Encounter (INDEPENDENT_AMBULATORY_CARE_PROVIDER_SITE_OTHER): Payer: 59 | Admitting: Ophthalmology

## 2020-01-29 ENCOUNTER — Other Ambulatory Visit: Payer: Self-pay | Admitting: Oncology

## 2020-01-29 DIAGNOSIS — C9110 Chronic lymphocytic leukemia of B-cell type not having achieved remission: Secondary | ICD-10-CM

## 2020-01-30 MED FILL — IMBRUVICA 420 MG TAB: 420 | 28 days supply | Qty: 28 | Fill #0

## 2020-03-01 ENCOUNTER — Other Ambulatory Visit: Payer: Self-pay | Admitting: Oncology

## 2020-03-01 DIAGNOSIS — C9110 Chronic lymphocytic leukemia of B-cell type not having achieved remission: Secondary | ICD-10-CM

## 2020-03-04 MED FILL — IMBRUVICA 420 MG TAB: 420 | 28 days supply | Qty: 28 | Fill #0

## 2020-04-03 ENCOUNTER — Other Ambulatory Visit: Payer: Self-pay | Admitting: Oncology

## 2020-04-03 ENCOUNTER — Telehealth: Payer: Self-pay | Admitting: Oncology

## 2020-04-03 DIAGNOSIS — C9110 Chronic lymphocytic leukemia of B-cell type not having achieved remission: Secondary | ICD-10-CM

## 2020-04-03 NOTE — Telephone Encounter (Signed)
Rescheduled 03/10 appointment to 04/19 per patient's request, informed provider and providers nurse regarding scheduling change.

## 2020-04-08 MED FILL — IMBRUVICA 420 MG TAB: 420 | 28 days supply | Qty: 28 | Fill #0

## 2020-04-18 ENCOUNTER — Other Ambulatory Visit: Payer: 59

## 2020-04-18 ENCOUNTER — Ambulatory Visit: Payer: 59 | Admitting: Oncology

## 2020-05-02 ENCOUNTER — Other Ambulatory Visit (HOSPITAL_COMMUNITY): Payer: Self-pay

## 2020-05-06 ENCOUNTER — Other Ambulatory Visit: Payer: Self-pay | Admitting: Oncology

## 2020-05-06 DIAGNOSIS — C9110 Chronic lymphocytic leukemia of B-cell type not having achieved remission: Secondary | ICD-10-CM

## 2020-05-12 ENCOUNTER — Other Ambulatory Visit (HOSPITAL_COMMUNITY): Payer: Self-pay

## 2020-05-12 MED FILL — Ibrutinib Tab 420 MG: ORAL | 28 days supply | Qty: 28 | Fill #0 | Status: CN

## 2020-05-14 ENCOUNTER — Other Ambulatory Visit (HOSPITAL_COMMUNITY): Payer: Self-pay

## 2020-05-15 ENCOUNTER — Other Ambulatory Visit (HOSPITAL_COMMUNITY): Payer: Self-pay

## 2020-05-16 ENCOUNTER — Other Ambulatory Visit (HOSPITAL_COMMUNITY): Payer: Self-pay

## 2020-05-28 ENCOUNTER — Other Ambulatory Visit: Payer: Self-pay

## 2020-05-28 ENCOUNTER — Inpatient Hospital Stay: Payer: Medicare Other | Admitting: Oncology

## 2020-05-28 ENCOUNTER — Telehealth: Payer: Self-pay

## 2020-05-28 ENCOUNTER — Inpatient Hospital Stay: Payer: Medicare Other | Attending: Oncology

## 2020-05-28 VITALS — BP 138/75 | HR 79 | Temp 96.0°F | Resp 18 | Ht 68.5 in | Wt 151.2 lb

## 2020-05-28 DIAGNOSIS — C8580 Other specified types of non-Hodgkin lymphoma, unspecified site: Secondary | ICD-10-CM | POA: Diagnosis present

## 2020-05-28 DIAGNOSIS — C9110 Chronic lymphocytic leukemia of B-cell type not having achieved remission: Secondary | ICD-10-CM

## 2020-05-28 DIAGNOSIS — Z9221 Personal history of antineoplastic chemotherapy: Secondary | ICD-10-CM | POA: Insufficient documentation

## 2020-05-28 LAB — CBC WITH DIFFERENTIAL (CANCER CENTER ONLY)
Abs Immature Granulocytes: 0.16 10*3/uL — ABNORMAL HIGH (ref 0.00–0.07)
Basophils Absolute: 0.1 10*3/uL (ref 0.0–0.1)
Basophils Relative: 0 %
Eosinophils Absolute: 0.1 10*3/uL (ref 0.0–0.5)
Eosinophils Relative: 0 %
HCT: 39 % (ref 39.0–52.0)
Hemoglobin: 12.4 g/dL — ABNORMAL LOW (ref 13.0–17.0)
Immature Granulocytes: 0 %
Lymphocytes Relative: 93 %
Lymphs Abs: 106.8 10*3/uL — ABNORMAL HIGH (ref 0.7–4.0)
MCH: 31 pg (ref 26.0–34.0)
MCHC: 31.8 g/dL (ref 30.0–36.0)
MCV: 97.5 fL (ref 80.0–100.0)
Monocytes Absolute: 4 10*3/uL — ABNORMAL HIGH (ref 0.1–1.0)
Monocytes Relative: 3 %
Neutro Abs: 3.8 10*3/uL (ref 1.7–7.7)
Neutrophils Relative %: 4 %
Platelet Count: 222 10*3/uL (ref 150–400)
RBC: 4 MIL/uL — ABNORMAL LOW (ref 4.22–5.81)
RDW: 13.3 % (ref 11.5–15.5)
WBC Count: 114.9 10*3/uL (ref 4.0–10.5)
nRBC: 0 % (ref 0.0–0.2)

## 2020-05-28 LAB — CMP (CANCER CENTER ONLY)
ALT: 9 U/L (ref 0–44)
AST: 19 U/L (ref 15–41)
Albumin: 4.2 g/dL (ref 3.5–5.0)
Alkaline Phosphatase: 62 U/L (ref 38–126)
Anion gap: 10 (ref 5–15)
BUN: 17 mg/dL (ref 8–23)
CO2: 25 mmol/L (ref 22–32)
Calcium: 8.9 mg/dL (ref 8.9–10.3)
Chloride: 105 mmol/L (ref 98–111)
Creatinine: 1.21 mg/dL (ref 0.61–1.24)
GFR, Estimated: >60 mL/min (ref >60)
Glucose, Bld: 131 mg/dL — ABNORMAL HIGH (ref 70–99)
Potassium: 4.2 mmol/L (ref 3.5–5.1)
Sodium: 140 mmol/L (ref 135–145)
Total Bilirubin: 0.9 mg/dL (ref 0.3–1.2)
Total Protein: 6.8 g/dL (ref 6.5–8.1)

## 2020-05-28 NOTE — Telephone Encounter (Signed)
Patient was here for an appointment this morning and asked to see me.  I had an application for JJPAF for him to sign to receive Imbruvica at no cost.  Patient stated Dr.Shadad was changing the medication after he depleted his current supply of Imbruvica.  He did not sign the JJ PAF application.  [facility] Tyler Scott Phone [phone] Fax [phone] 05/28/2020 10:57 AM

## 2020-05-28 NOTE — Progress Notes (Signed)
[facility] OFFICE PROGRESS NOTE 05/28/20  Tyler Cookey, MD (Inactive) No address on file  DIAGNOSIS: 65 year old man with stage IIIa small lymphocytic lymphoma diagnosed in 2012.   Prior therapy:  Bendamustine and rituximab started in July 2017 and received total of 2 cycles. Therapy discontinued in August 2017 based on patient's wishes after achieving partial response.  He developed a relapse disease in April 2019.    CURRENT THERAPY: Ibrutinib 420 mg daily started in April 2019.  INTERVAL HISTORY: Tyler Scott is here for repeat evaluation.  Since the last visit, he reports no major changes in his health.  He denies any nausea, vomiting or abdominal pain.  He denies any painful adenopathy.  He denies any recent hospitalizations or illnesses.  He denies any complications related to ibrutinib including bleeding, bruising or palpitation.      ALLERGIES:  has No Known Allergies.  MEDICATIONS: Updated on review.  Current Outpatient Medications on File Prior to Visit  Medication Sig Dispense Refill  . Blood Glucose Monitoring Suppl (ONE TOUCH ULTRA 2) W/DEVICE KIT See admin instructions.  0  . doxepin (SINEQUAN) 25 MG capsule     . ibrutinib (IMBRUVICA) 420 MG TABS TAKE 1 TABLET (420 MG) BY MOUTH DAILY. TAKE WITH A GLASS OF WATER THE SAME TIME EACH DAY. 28 tablet 0  . Insulin Glargine (LANTUS SOLOSTAR) 100 UNIT/ML Solostar Pen INJECT 20 UNITS SUBCUTANEOUSLY AT BEDTIME 3 pen 11  . lisinopril (PRINIVIL,ZESTRIL) 40 MG tablet Take 1 tablet (40 mg total) by mouth daily. 90 tablet 4  . meloxicam (MOBIC) 7.5 MG tablet TAKE 1 TABLET (7.5 MG TOTAL) BY MOUTH DAILY. 30 tablet 1  . ONE TOUCH ULTRA TEST test strip TEST GLUCOSE 3 TIMES A DAY 300 each 1  . ONETOUCH DELICA LANCETS 93G MISC TEST GLUCOSE 3 TIMES A DAY 300 each 1  . sildenafil (REVATIO) 20 MG tablet Take 1 to 2 tabs 2 - 3 hours before sex 30 tablet 11  . simvastatin (ZOCOR) 20 MG tablet TAKE 1 TABLET (20 MG TOTAL)  BY MOUTH AT BEDTIME. 100 tablet 4  . sitaGLIPtin (JANUVIA) 100 MG tablet Take 1 tablet (100 mg total) by mouth daily. 30 tablet 11   No current facility-administered medications on file prior to visit.      PHYSICAL EXAMINATION:    Blood pressure 138/75, pulse 79, temperature (!) 96 F (35.6 C), temperature source Tympanic, resp. rate 18, height 5' 8.5" (1.74 m), weight 151 lb 3.2 oz (68.6 kg), SpO2 100 %.      ECOG  0    General appearance: Comfortable appearing without any discomfort Head: Normocephalic without any trauma Oropharynx: Mucous membranes are moist and pink without any thrush or ulcers. Eyes: Pupils are equal and round reactive to light. Lymph nodes: No cervical, supraclavicular, inguinal or axillary lymphadenopathy.   Heart:regular rate and rhythm.  S1 and S2 without leg edema. Lung: Clear without any rhonchi or wheezes.  No dullness to percussion. Abdomin: Soft, nontender, nondistended with good bowel sounds.  No hepatosplenomegaly. Musculoskeletal: No joint deformity or effusion.  Full range of motion noted. Neurological: No deficits noted on motor, sensory and deep tendon reflex exam. Skin: No petechial rash or dryness.  Appeared moist.          Labs:  Lab Results  Component Value Date   WBC 94.0 (HH) 12/19/2019   HGB 13.3 12/19/2019   HCT 41.5 12/19/2019   MCV 94.7 12/19/2019   PLT 200 12/19/2019  NEUTROABS 6.0 12/19/2019      Chemistry      Component Value Date/Time   NA 140 12/19/2019 0934   NA 142 01/01/2017 0809   K 4.4 12/19/2019 0934   K 4.4 01/01/2017 0809   CL 107 12/19/2019 0934   CL 102 04/25/2012 1003   CO2 27 12/19/2019 0934   CO2 26 01/01/2017 0809   BUN 17 12/19/2019 0934   BUN 15.7 01/01/2017 0809   CREATININE 1.12 12/19/2019 0934   CREATININE 0.9 01/01/2017 0809      Component Value Date/Time   CALCIUM 8.7 (L) 12/19/2019 0934   CALCIUM 9.1 01/01/2017 0809   ALKPHOS 66 12/19/2019 0934   ALKPHOS 68 01/01/2017  0809   AST 23 12/19/2019 0934   AST 22 01/01/2017 0809   ALT 15 12/19/2019 0934   ALT 19 01/01/2017 0809   BILITOT 1.2 12/19/2019 0934   BILITOT 0.75 01/01/2017 0809        Assessment and plan:  65 year old man with:    1.  Small lymphocytic lymphoma diagnosed in 2012.  He was found to have stage IIIa disease.   His disease status was updated at this time and treatment choices were reviewed.  He is status post chemotherapy and currently on ibrutinib.  Risks and benefits of continuing this treatment long-term were discussed.  Alternative treatment options will be utilizing chemotherapy with Bendamustine and rituximab versus Calquence.  Laboratory data reviewed today continues to show slight rise in his white cell count and this is likely related to lack of adherence to this medication.  He reports he is taking it 3-4 times per week.  After discussion today, he opted to continue ibrutinib and he will adhere more to his schedule of taking it daily.  If he is unable to do so we will switch him to Calquence   2.  Cardiovascular complications: None reported at this time.  We will continue to educate him about the potential complications.  3.  Bleeding complications: He does not report any bleeding issues at this time.  4. Follow-up: In 3 months for repeat follow-up.   30  minutes were dedicated to this encounter.  Time was spent on reviewing laboratory data, disease status update, treatment options and future plan of care review.    [name], MD 05/28/20 9:58 AM

## 2020-06-10 ENCOUNTER — Other Ambulatory Visit (HOSPITAL_COMMUNITY): Payer: Self-pay

## 2020-06-24 ENCOUNTER — Other Ambulatory Visit (HOSPITAL_COMMUNITY): Payer: Self-pay

## 2020-06-26 ENCOUNTER — Other Ambulatory Visit (HOSPITAL_COMMUNITY): Payer: Self-pay

## 2020-08-06 ENCOUNTER — Telehealth: Payer: Self-pay | Admitting: Oncology

## 2020-08-06 NOTE — Telephone Encounter (Signed)
Rescheduled July appointment to August per patient's request.

## 2020-08-28 ENCOUNTER — Ambulatory Visit: Payer: Medicare Other | Admitting: Oncology

## 2020-08-28 ENCOUNTER — Other Ambulatory Visit: Payer: Medicare Other

## 2020-10-03 ENCOUNTER — Inpatient Hospital Stay: Payer: Medicare Other

## 2020-10-03 ENCOUNTER — Inpatient Hospital Stay: Payer: Medicare Other | Admitting: Oncology

## 2020-10-03 ENCOUNTER — Telehealth: Payer: Self-pay | Admitting: *Deleted

## 2020-10-03 NOTE — Telephone Encounter (Signed)
Patient missed appt for lab and to see Dr. Alen Blew today. Contacted patient at home number - LVM - please contact office to r/s appointment at earliest convenience.  Schedule message sent to CC scheduling to contact patient to r/s appts.

## 2020-10-23 ENCOUNTER — Inpatient Hospital Stay: Payer: Medicare Other | Attending: Oncology

## 2020-10-23 ENCOUNTER — Inpatient Hospital Stay: Payer: Medicare Other | Admitting: Oncology

## 2020-10-23 ENCOUNTER — Other Ambulatory Visit: Payer: Self-pay

## 2020-10-23 VITALS — BP 140/73 | HR 73 | Temp 96.3°F | Resp 18 | Wt 145.6 lb

## 2020-10-23 DIAGNOSIS — C9110 Chronic lymphocytic leukemia of B-cell type not having achieved remission: Secondary | ICD-10-CM | POA: Diagnosis not present

## 2020-10-23 DIAGNOSIS — C8300 Small cell B-cell lymphoma, unspecified site: Secondary | ICD-10-CM | POA: Diagnosis present

## 2020-10-23 DIAGNOSIS — Z79899 Other long term (current) drug therapy: Secondary | ICD-10-CM | POA: Diagnosis not present

## 2020-10-23 LAB — CBC WITH DIFFERENTIAL (CANCER CENTER ONLY)
Abs Immature Granulocytes: 0.28 10*3/uL — ABNORMAL HIGH (ref 0.00–0.07)
Basophils Absolute: 0.1 10*3/uL (ref 0.0–0.1)
Basophils Relative: 0 %
Eosinophils Absolute: 0.2 10*3/uL (ref 0.0–0.5)
Eosinophils Relative: 0 %
HCT: 45.4 % (ref 39.0–52.0)
Hemoglobin: 14.7 g/dL (ref 13.0–17.0)
Immature Granulocytes: 0 %
Lymphocytes Relative: 92 %
Lymphs Abs: 131.3 10*3/uL — ABNORMAL HIGH (ref 0.7–4.0)
MCH: 31.4 pg (ref 26.0–34.0)
MCHC: 32.4 g/dL (ref 30.0–36.0)
MCV: 97 fL (ref 80.0–100.0)
Monocytes Absolute: 5.6 10*3/uL — ABNORMAL HIGH (ref 0.1–1.0)
Monocytes Relative: 4 %
Neutro Abs: 5.1 10*3/uL (ref 1.7–7.7)
Neutrophils Relative %: 4 %
Platelet Count: 214 10*3/uL (ref 150–400)
RBC: 4.68 MIL/uL (ref 4.22–5.81)
RDW: 14.4 % (ref 11.5–15.5)
WBC Count: 142.6 10*3/uL (ref 4.0–10.5)
nRBC: 0 % (ref 0.0–0.2)

## 2020-10-23 LAB — CMP (CANCER CENTER ONLY)
ALT: 11 U/L (ref 0–44)
AST: 20 U/L (ref 15–41)
Albumin: 4.7 g/dL (ref 3.5–5.0)
Alkaline Phosphatase: 66 U/L (ref 38–126)
Anion gap: 10 (ref 5–15)
BUN: 10 mg/dL (ref 8–23)
CO2: 28 mmol/L (ref 22–32)
Calcium: 10 mg/dL (ref 8.9–10.3)
Chloride: 102 mmol/L (ref 98–111)
Creatinine: 1.12 mg/dL (ref 0.61–1.24)
GFR, Estimated: >60 mL/min (ref >60)
Glucose, Bld: 140 mg/dL — ABNORMAL HIGH (ref 70–99)
Potassium: 5.4 mmol/L — ABNORMAL HIGH (ref 3.5–5.1)
Sodium: 140 mmol/L (ref 135–145)
Total Bilirubin: 1.4 mg/dL — ABNORMAL HIGH (ref 0.3–1.2)
Total Protein: 7.4 g/dL (ref 6.5–8.1)

## 2020-10-23 NOTE — Progress Notes (Signed)
CRITICAL VALUE STICKER  CRITICAL VALUE: WBC 142.6  RECEIVER (on-site recipient of call): Velna Ochs RN  DATE & TIME NOTIFIED: 10/23/20 @ 1015  MESSENGER (representative from lab): Pam  MD NOTIFIED: Dr. Alen Blew  TIME OF NOTIFICATION:1018  RESPONSE:  MD aware

## 2020-10-23 NOTE — Progress Notes (Signed)
[facility] OFFICE PROGRESS NOTE 10/23/20  Tyler Cookey, MD (Inactive) No address on file  DIAGNOSIS: 65 year old man with small lymphocytic lymphoma presented with lymphocytosis and lymphadenopathy  in 2012.   Prior therapy:  Bendamustine and rituximab started in July 2017 and received total of 2 cycles. Therapy discontinued in August 2017 based on patient's wishes after achieving partial response.  He developed a relapse disease in April 2019.    CURRENT THERAPY: Ibrutinib 420 mg daily started in April 2019.  INTERVAL HISTORY: [name] is here for a follow-up visit.  Since the last visit, he reports no major changes in his health.  He denies any recent hospitalizations or illnesses.  He continues to take ibrutinib but not very regularly.  He reports taking it very infrequently.  He reports some mild nausea and fatigue when he does take it.  He denies any fevers, chills or sweats or worsening adenopathy.      ALLERGIES:  has No Known Allergies.  MEDICATIONS: Reviewed without changes.  Current Outpatient Medications on File Prior to Visit  Medication Sig Dispense Refill   Blood Glucose Monitoring Suppl (ONE TOUCH ULTRA 2) W/DEVICE KIT See admin instructions.  0   doxepin (SINEQUAN) 25 MG capsule      ibrutinib (IMBRUVICA) 420 MG TABS TAKE 1 TABLET (420 MG) BY MOUTH DAILY. TAKE WITH A GLASS OF WATER THE SAME TIME EACH DAY. 28 tablet 0   Insulin Glargine (LANTUS SOLOSTAR) 100 UNIT/ML Solostar Pen INJECT 20 UNITS SUBCUTANEOUSLY AT BEDTIME 3 pen 11   lisinopril (PRINIVIL,ZESTRIL) 40 MG tablet Take 1 tablet (40 mg total) by mouth daily. 90 tablet 4   meloxicam (MOBIC) 7.5 MG tablet TAKE 1 TABLET (7.5 MG TOTAL) BY MOUTH DAILY. 30 tablet 1   ONE TOUCH ULTRA TEST test strip TEST GLUCOSE 3 TIMES A DAY 300 each 1   ONETOUCH DELICA LANCETS 54M MISC TEST GLUCOSE 3 TIMES A DAY 300 each 1   sildenafil (REVATIO) 20 MG tablet Take 1 to 2 tabs 2 - 3 hours before sex 30  tablet 11   simvastatin (ZOCOR) 20 MG tablet TAKE 1 TABLET (20 MG TOTAL) BY MOUTH AT BEDTIME. 100 tablet 4   sitaGLIPtin (JANUVIA) 100 MG tablet Take 1 tablet (100 mg total) by mouth daily. (Patient not taking: Reported on 05/28/2020) 30 tablet 11   No current facility-administered medications on file prior to visit.      PHYSICAL EXAMINATION:    Blood pressure 140/73, pulse 73, temperature (!) 96.3 F (35.7 C), temperature source Oral, resp. rate 18, weight 145 lb 9 oz (66 kg), SpO2 100 %.       ECOG  0    General appearance: Alert, awake without any distress. Head: Atraumatic without abnormalities Oropharynx: Without any thrush or ulcers. Eyes: No scleral icterus. Lymph nodes: No lymphadenopathy noted in the cervical, supraclavicular, or axillary nodes Heart:regular rate and rhythm, without any murmurs or gallops.   Lung: Clear to auscultation without any rhonchi, wheezes or dullness to percussion. Abdomin: Soft, nontender without any shifting dullness or ascites. Musculoskeletal: No clubbing or cyanosis. Neurological: No motor or sensory deficits. Skin: No rashes or lesions.          Labs:  Lab Results  Component Value Date   WBC 114.9 (HH) 05/28/2020   HGB 12.4 (L) 05/28/2020   HCT 39.0 05/28/2020   MCV 97.5 05/28/2020   PLT 222 05/28/2020   NEUTROABS 3.8 05/28/2020      Chemistry  Component Value Date/Time   NA 140 05/28/2020 0945   NA 142 01/01/2017 0809   K 4.2 05/28/2020 0945   K 4.4 01/01/2017 0809   CL 105 05/28/2020 0945   CL 102 04/25/2012 1003   CO2 25 05/28/2020 0945   CO2 26 01/01/2017 0809   BUN 17 05/28/2020 0945   BUN 15.7 01/01/2017 0809   CREATININE 1.21 05/28/2020 0945   CREATININE 0.9 01/01/2017 0809      Component Value Date/Time   CALCIUM 8.9 05/28/2020 0945   CALCIUM 9.1 01/01/2017 0809   ALKPHOS 62 05/28/2020 0945   ALKPHOS 68 01/01/2017 0809   AST 19 05/28/2020 0945   AST 22 01/01/2017 0809   ALT 9 05/28/2020  0945   ALT 19 01/01/2017 0809   BILITOT 0.9 05/28/2020 0945   BILITOT 0.75 01/01/2017 0809         Assessment and plan:  65 year old man with:    1.  Stage IIIa Small lymphocytic lymphoma diagnosed in 2012.    The natural course of his disease was reviewed at this time laboratory data were discussed.  Treatment options were also reiterated.  Laboratory data from today indicate a increase in white cell count although clinically he does not have any symptomatic lymphadenopathy.  Treatment choices were reviewed at this time.  Emphasizing taking ibrutinib regularly would be 1 option but it appears to be an unrealistic 1 for him given the toxicities.  Switching to Calquence was also discussed today in addition to venetoclax.  Systemic chemotherapy utilizing Bendamustine and rituximab was also reviewed given his previous exposure to it.  After discussion today, he is opposed to trying different oral medication.  He would consider systemic chemotherapy if he develops worsening lymphadenopathy.  In the meantime he will try to take ibrutinib more regularly.   2.  Cardiovascular complications: I continue to educate him about potential complications related to ibrutinib including cardiovascular complications and bleeding issues.    3. Follow-up: We will be in 4 months for repeat follow-up.   30  minutes were spent on this visit.  The time was dedicated to reviewing laboratory data, disease status update and future plan of care discussion.    [name], MD 10/23/20 9:51 AM

## 2020-11-01 ENCOUNTER — Telehealth: Payer: Self-pay | Admitting: Oncology

## 2020-11-01 NOTE — Telephone Encounter (Signed)
Scheduled per 09/14 los, patient has been called and notified. 

## 2020-12-30 ENCOUNTER — Encounter (INDEPENDENT_AMBULATORY_CARE_PROVIDER_SITE_OTHER): Payer: Medicare Other | Admitting: Ophthalmology

## 2020-12-30 ENCOUNTER — Other Ambulatory Visit: Payer: Self-pay

## 2020-12-30 DIAGNOSIS — H35033 Hypertensive retinopathy, bilateral: Secondary | ICD-10-CM

## 2020-12-30 DIAGNOSIS — I10 Essential (primary) hypertension: Secondary | ICD-10-CM | POA: Diagnosis not present

## 2020-12-30 DIAGNOSIS — H2513 Age-related nuclear cataract, bilateral: Secondary | ICD-10-CM

## 2020-12-30 DIAGNOSIS — H35371 Puckering of macula, right eye: Secondary | ICD-10-CM | POA: Diagnosis not present

## 2020-12-30 DIAGNOSIS — H43813 Vitreous degeneration, bilateral: Secondary | ICD-10-CM

## 2020-12-30 DIAGNOSIS — E113593 Type 2 diabetes mellitus with proliferative diabetic retinopathy without macular edema, bilateral: Secondary | ICD-10-CM | POA: Diagnosis not present

## 2021-02-17 ENCOUNTER — Telehealth: Payer: Self-pay | Admitting: Oncology

## 2021-02-17 NOTE — Telephone Encounter (Signed)
Sch per 1/6 inbasket, pt req to r/s out a few months into March, pt aware

## 2021-02-19 ENCOUNTER — Inpatient Hospital Stay: Payer: Medicare Other

## 2021-02-19 ENCOUNTER — Inpatient Hospital Stay: Payer: Medicare Other | Admitting: Oncology

## 2021-04-17 ENCOUNTER — Telehealth: Payer: Self-pay | Admitting: Oncology

## 2021-04-17 ENCOUNTER — Inpatient Hospital Stay: Payer: Medicare Other

## 2021-04-17 ENCOUNTER — Inpatient Hospital Stay: Payer: Medicare Other | Admitting: Oncology

## 2021-04-17 ENCOUNTER — Telehealth: Payer: Self-pay | Admitting: *Deleted

## 2021-04-17 NOTE — Telephone Encounter (Signed)
.  Called patient to schedule appointment per 3/9 inbasket, patient is aware of date and time.   ?

## 2021-04-17 NOTE — Telephone Encounter (Signed)
PC to patient regarding missed appointments today, he states he is sick & is unable to come.  Informed patient scheduling will contact him to reschedule, he verbalizes understanding.  Scheduling message sent. ?

## 2021-04-28 ENCOUNTER — Encounter (INDEPENDENT_AMBULATORY_CARE_PROVIDER_SITE_OTHER): Payer: Self-pay

## 2021-04-28 ENCOUNTER — Ambulatory Visit: Payer: Medicare Other | Admitting: Dermatology

## 2021-04-28 ENCOUNTER — Other Ambulatory Visit: Payer: Self-pay

## 2021-04-28 DIAGNOSIS — L811 Chloasma: Secondary | ICD-10-CM

## 2021-04-28 MED ORDER — TRI-LUMA 0.01-4-0.05 % EX CREA: 1.0000 "application " | TOPICAL_CREAM | Freq: Every evening | CUTANEOUS | 2 refills | Status: DC

## 2021-04-28 NOTE — Patient Instructions (Addendum)
ALWAYS REAPPLY - 50+ SPF ? ?DR. MCMICHAEL ?

## 2021-05-10 ENCOUNTER — Encounter: Payer: Self-pay | Admitting: Dermatology

## 2021-05-10 NOTE — Progress Notes (Signed)
? ?  New Patient ?  ?Subjective  ?Tyler Scott is a 66 y.o. male who presents for the following: Skin Problem (Pt has been dealing with discoloration of the face and some on the hands x5 months.). ? ?Patient face: Hands, worse in the past year. ?Location:  ?Duration:  ?Quality:  ?Associated Signs/Symptoms: ?Modifying Factors:  ?Severity:  ?Timing: ?Context:  ? ? ?The following portions of the chart were reviewed this encounter and updated as appropriate:  Tobacco  Allergies  Meds  Problems  Med Hx  Surg Hx  Fam Hx   ?  ? ?Objective  ?Well appearing patient in no apparent distress; mood and affect are within normal limits. ?Head - Anterior (Face) ?Clinically he has a mix of postinflammatory hyperpigmentation with perhaps a component of melasma/ethnic pigmentation with aging. ? ? ? ?A focused examination was performed including head, neck, face.. Relevant physical exam findings are noted in the Assessment and Plan. ? ? ?Assessment & Plan  ?Melasma ?Head - Anterior (Face) ? ?Discussed multiple treatment options including over-the-counter products, generic Tri-Luma, procedures.  He will initially go with the Tri-Luma daily for 10 weeks.  If there is no improvement he may consult Dr. Lois Huxley in the future. ? ?Related Medications ?Fluocin-Hydroquinone-Tretinoin (TRI-LUMA) 0.01-4-0.05 % CREA ?Apply 1 application. topically at bedtime. ? ? ?

## 2021-05-14 ENCOUNTER — Other Ambulatory Visit: Payer: Self-pay

## 2021-05-14 ENCOUNTER — Inpatient Hospital Stay: Payer: Medicare Other | Attending: Oncology

## 2021-05-14 ENCOUNTER — Inpatient Hospital Stay: Payer: Medicare Other | Admitting: Oncology

## 2021-05-14 VITALS — BP 114/77 | HR 88 | Temp 97.8°F | Resp 16 | Ht 68.5 in | Wt 154.8 lb

## 2021-05-14 DIAGNOSIS — Z9221 Personal history of antineoplastic chemotherapy: Secondary | ICD-10-CM | POA: Insufficient documentation

## 2021-05-14 DIAGNOSIS — C9110 Chronic lymphocytic leukemia of B-cell type not having achieved remission: Secondary | ICD-10-CM | POA: Insufficient documentation

## 2021-05-14 LAB — CMP (CANCER CENTER ONLY)
ALT: 14 U/L (ref 0–44)
AST: 24 U/L (ref 15–41)
Albumin: 4.5 g/dL (ref 3.5–5.0)
Alkaline Phosphatase: 81 U/L (ref 38–126)
Anion gap: 7 (ref 5–15)
BUN: 12 mg/dL (ref 8–23)
CO2: 28 mmol/L (ref 22–32)
Calcium: 9 mg/dL (ref 8.9–10.3)
Chloride: 104 mmol/L (ref 98–111)
Creatinine: 1.02 mg/dL (ref 0.61–1.24)
GFR, Estimated: >60 mL/min (ref >60)
Glucose, Bld: 83 mg/dL (ref 70–99)
Potassium: 4 mmol/L (ref 3.5–5.1)
Sodium: 139 mmol/L (ref 135–145)
Total Bilirubin: 1.2 mg/dL (ref 0.3–1.2)
Total Protein: 7 g/dL (ref 6.5–8.1)

## 2021-05-14 LAB — CBC WITH DIFFERENTIAL (CANCER CENTER ONLY)
Abs Immature Granulocytes: 0.2 10*3/uL — ABNORMAL HIGH (ref 0.00–0.07)
Basophils Absolute: 0.2 10*3/uL — ABNORMAL HIGH (ref 0.0–0.1)
Basophils Relative: 0 %
Eosinophils Absolute: 0.1 10*3/uL (ref 0.0–0.5)
Eosinophils Relative: 0 %
HCT: 39.1 % (ref 39.0–52.0)
Hemoglobin: 12.5 g/dL — ABNORMAL LOW (ref 13.0–17.0)
Immature Granulocytes: 0 %
Lymphocytes Relative: 90 %
Lymphs Abs: 91.8 10*3/uL — ABNORMAL HIGH (ref 0.7–4.0)
MCH: 30.9 pg (ref 26.0–34.0)
MCHC: 32 g/dL (ref 30.0–36.0)
MCV: 96.5 fL (ref 80.0–100.0)
Monocytes Absolute: 5.5 10*3/uL — ABNORMAL HIGH (ref 0.1–1.0)
Monocytes Relative: 5 %
Neutro Abs: 5 10*3/uL (ref 1.7–7.7)
Neutrophils Relative %: 5 %
Platelet Count: 187 10*3/uL (ref 150–400)
RBC: 4.05 MIL/uL — ABNORMAL LOW (ref 4.22–5.81)
RDW: 14.2 % (ref 11.5–15.5)
Smear Review: NORMAL
WBC Count: 102.8 10*3/uL (ref 4.0–10.5)
nRBC: 0 % (ref 0.0–0.2)

## 2021-05-14 NOTE — Progress Notes (Signed)
[place] ?OFFICE PROGRESS NOTE ?05/14/21 ? ?Tyler Cookey, MD (Inactive) ?No address on file ? ?DIAGNOSIS: 66 year old man with CLL/small lymphocytic lymphoma diagnosed in 2012.  He presented with lymphocytosis and lymphadenopathy at that time. ? ?Prior therapy:  ?Bendamustine and rituximab started in July 2017 and received total of 2 cycles. Therapy discontinued in August 2017 based on patient's wishes after achieving partial response. ? ?He developed a relapse disease in April 2019. ?  ? ?CURRENT THERAPY: Ibrutinib 420 mg daily started in April 2019. ? ?INTERVAL HISTORY: ?Tyler Scott is here for repeat evaluation.  Since the last visit, he reports feeling well without any major complaints.  He denies any nausea, vomiting or abdominal pain.  He denies any fevers or chills or sweats.  He denies any hospitalizations or illnesses.  He has been very erratic and his ibrutinib intake citing GI toxicity at times. ? ? ? ? ?ALLERGIES:  has No Known Allergies. ? ?MEDICATIONS: Updated on review. ? ?Current Outpatient Medications on File Prior to Visit  ?Medication Sig Dispense Refill  ? Blood Glucose Monitoring Suppl (ONE TOUCH ULTRA 2) W/DEVICE KIT See admin instructions.  0  ? Cholecalciferol 50 MCG (2000 UT) TABS TAKE ONE TABLET BY MOUTH DAILY FOR LOW VITAMIN D.    ? doxepin (SINEQUAN) 25 MG capsule Take 1 capsule by mouth at bedtime.    ? finasteride (PROPECIA) 1 MG tablet Take 1 tablet by mouth daily.    ? finasteride (PROSCAR) 5 MG tablet Take 0.5 tablets by mouth daily.    ? Fluocin-Hydroquinone-Tretinoin (TRI-LUMA) 0.01-4-0.05 % CREA Apply 1 application. topically at bedtime. 30 g 2  ? ibrutinib (IMBRUVICA) 420 MG TABS TAKE 1 TABLET (420 MG) BY MOUTH DAILY. TAKE WITH A GLASS OF WATER THE SAME TIME EACH DAY. 28 tablet 0  ? Insulin Glargine (LANTUS SOLOSTAR) 100 UNIT/ML Solostar Pen INJECT 20 UNITS SUBCUTANEOUSLY AT BEDTIME 3 pen 11  ? INSULIN GLARGINE, 1 UNIT DIAL, [place] INJECT 20 UNITS SUBCUTANEOUSLY  DAILY    ? insulin glargine-yfgn (SEMGLEE) 100 UNIT/ML Pen INJECT 22 UNITS SUBCUTANEOUSLY AT BEDTIME (USE WITHIN 28 DAYS AFTER OPENING PEN) (CONVERTED FROM LANTUS)    ? lisinopril (PRINIVIL,ZESTRIL) 40 MG tablet Take 1 tablet (40 mg total) by mouth daily. 90 tablet 4  ? meloxicam (MOBIC) 7.5 MG tablet TAKE 1 TABLET (7.5 MG TOTAL) BY MOUTH DAILY. 30 tablet 1  ? ONE TOUCH ULTRA TEST test strip TEST GLUCOSE 3 TIMES A DAY 300 each 1  ? ONETOUCH DELICA LANCETS 36R MISC TEST GLUCOSE 3 TIMES A DAY 300 each 1  ? sildenafil (REVATIO) 20 MG tablet Take 1 to 2 tabs 2 - 3 hours before sex 30 tablet 11  ? simvastatin (ZOCOR) 20 MG tablet TAKE 1 TABLET (20 MG TOTAL) BY MOUTH AT BEDTIME. 100 tablet 4  ? ?No current facility-administered medications on file prior to visit.  ? ? ? ? ?PHYSICAL EXAMINATION: ? ? ? ? ? ?Blood pressure 114/77, pulse 88, temperature 97.8 ?F (36.6 ?C), temperature source Temporal, resp. rate 16, height 5' 8.5" (1.74 m), weight 154 lb 12.8 oz (70.2 kg), SpO2 100 %. ? ? ? ? ? ?ECOG  0 ? ? ?General appearance: Comfortable appearing without any discomfort ?Head: Normocephalic without any trauma ?Oropharynx: Mucous membranes are moist and pink without any thrush or ulcers. ?Eyes: Pupils are equal and round reactive to light. ?Lymph nodes: No cervical, supraclavicular, inguinal or axillary lymphadenopathy.   ?Heart:regular rate and rhythm.  S1 and S2  without leg edema. ?Lung: Clear without any rhonchi or wheezes.  No dullness to percussion. ?Abdomin: Soft, nontender, nondistended with good bowel sounds.  No hepatosplenomegaly. ?Musculoskeletal: No joint deformity or effusion.  Full range of motion noted. ?Neurological: No deficits noted on motor, sensory and deep tendon reflex exam. ?Skin: No petechial rash or dryness.  Appeared moist.  ? ? ? ? ? ? ? ? ? ? ?Labs:  ?Lab Results  ?Component Value Date  ? WBC 142.6 (HH) 10/23/2020  ? HGB 14.7 10/23/2020  ? HCT 45.4 10/23/2020  ? MCV 97.0 10/23/2020  ? PLT 214  10/23/2020  ? NEUTROABS 5.1 10/23/2020  ? ? ?  Chemistry   ?   ?Component Value Date/Time  ? NA 140 10/23/2020 1000  ? NA 142 01/01/2017 0809  ? K 5.4 (H) 10/23/2020 1000  ? K 4.4 01/01/2017 0809  ? CL 102 10/23/2020 1000  ? CL 102 04/25/2012 1003  ? CO2 28 10/23/2020 1000  ? CO2 26 01/01/2017 0809  ? BUN 10 10/23/2020 1000  ? BUN 15.7 01/01/2017 0809  ? CREATININE 1.12 10/23/2020 1000  ? CREATININE 0.9 01/01/2017 0809  ?    ?Component Value Date/Time  ? CALCIUM 10.0 10/23/2020 1000  ? CALCIUM 9.1 01/01/2017 0809  ? ALKPHOS 66 10/23/2020 1000  ? ALKPHOS 68 01/01/2017 0809  ? AST 20 10/23/2020 1000  ? AST 22 01/01/2017 0809  ? ALT 11 10/23/2020 1000  ? ALT 19 01/01/2017 0809  ? BILITOT 1.4 (H) 10/23/2020 1000  ? BILITOT 0.75 01/01/2017 0809  ?  ? ? ?  ? ?Assessment and plan: ? ?66 year old man with:  ? ? ?1.  Small lymphocytic lymphoma diagnosed in 2012.  He has stage IIIa disease. ? ?He is currently on ibrutinib although not taking this medication regularly.  The natural course of his disease and treatment options were reviewed.  Switching this medication to Calquence versus systemic chemotherapy were reiterated.  Laboratory data from today reviewed and showed normal hemoglobin and platelet count and his white cell count has declined from previous count of 142 down to 102. ? ?After discussion today, he will consider these options and let me know in the near future. ? ? ?2.  Autoimmune considerations: Complications that include autoimmune hemolysis and thrombocytopenia were reiterated.  He has not experienced any at this time.  Risk of bleeding associated with ibrutinib is also discussed in this particular setting. ? ? ? ?3. Follow-up: In 4 months for repeat follow-up. ? ? ?30  minutes were dedicated to this visit.  The time was spent on reviewing laboratory data, disease status update, treatment choices and addressing complication related to cancer and cancer therapy. ? ?  ?[name], MD ?05/14/21 12:30 PM ?

## 2021-06-13 ENCOUNTER — Encounter: Payer: Self-pay | Admitting: *Deleted

## 2021-06-16 ENCOUNTER — Telehealth: Payer: Self-pay | Admitting: Oncology

## 2021-06-16 ENCOUNTER — Telehealth: Payer: Self-pay | Admitting: *Deleted

## 2021-06-16 NOTE — Telephone Encounter (Signed)
.  Called patient to schedule appointment per 5/8 inbasket, patient is aware of date and time.   ?

## 2021-06-16 NOTE — Telephone Encounter (Signed)
Pt states he saw his PCP at [facility] last week. Pt noted a new "knot" on his left jaw bone ~ 1 week ago. MD suggested patient see Dr Alen Blew and get a scan to assess area. ? ?Message to scheduler to get patient in with Dr Alen Blew next available appt. ?Bethena Roys, RN had previously requested records from [place].  ?

## 2021-06-18 ENCOUNTER — Inpatient Hospital Stay: Payer: Medicare Other | Attending: Oncology | Admitting: Oncology

## 2021-06-18 ENCOUNTER — Other Ambulatory Visit: Payer: Self-pay

## 2021-06-18 VITALS — BP 145/75 | HR 98 | Temp 97.7°F | Resp 16 | Ht 68.5 in | Wt 148.2 lb

## 2021-06-18 DIAGNOSIS — C9110 Chronic lymphocytic leukemia of B-cell type not having achieved remission: Secondary | ICD-10-CM | POA: Diagnosis not present

## 2021-06-18 DIAGNOSIS — Z91199 Patient's noncompliance with other medical treatment and regimen due to unspecified reason: Secondary | ICD-10-CM | POA: Insufficient documentation

## 2021-06-18 DIAGNOSIS — Z79899 Other long term (current) drug therapy: Secondary | ICD-10-CM | POA: Insufficient documentation

## 2021-06-18 DIAGNOSIS — R220 Localized swelling, mass and lump, head: Secondary | ICD-10-CM | POA: Diagnosis not present

## 2021-06-18 NOTE — Progress Notes (Signed)
[facility] ?OFFICE PROGRESS NOTE ?06/18/21 ? ?[name], MD (Inactive) ?No address on file ? ?DIAGNOSIS: 66 year old man with low-grade lymphoma presented with lymphadenopathy and lymphocytosis in 2012.  He was found to have CLL/small lymphocytic lymphoma. ?Prior therapy:  ?Bendamustine and rituximab started in July 2017 and received total of 2 cycles. Therapy discontinued in August 2017 based on patient's wishes after achieving partial response. ? ?He developed a relapse disease in April 2019. ?  ? ?CURRENT THERAPY: Ibrutinib 420 mg daily started in April 2019. ? ?INTERVAL HISTORY: ?Tyler Scott presents today for repeat evaluation.  Since last visit, he reports feeling well and no changes in his health.  He did report a left lump at the angle of his jaw on the left side.  He denies any fevers, chills or sweats.  He denies any dysphagia, odontophagia or stridor.  His performance status quality of life remains unchanged. ? ? ? ? ?ALLERGIES:  has No Known Allergies. ? ?MEDICATIONS: Updated on review. ? ?Current Outpatient Medications on File Prior to Visit  ?Medication Sig Dispense Refill  ? Blood Glucose Monitoring Suppl (ONE TOUCH ULTRA 2) W/DEVICE KIT See admin instructions.  0  ? Cholecalciferol 50 MCG (2000 UT) TABS TAKE ONE TABLET BY MOUTH DAILY FOR LOW VITAMIN D.    ? doxepin (SINEQUAN) 25 MG capsule Take 1 capsule by mouth at bedtime.    ? finasteride (PROPECIA) 1 MG tablet Take 1 tablet by mouth daily.    ? finasteride (PROSCAR) 5 MG tablet Take 0.5 tablets by mouth daily.    ? Fluocin-Hydroquinone-Tretinoin (TRI-LUMA) 0.01-4-0.05 % CREA Apply 1 application. topically at bedtime. 30 g 2  ? ibrutinib (IMBRUVICA) 420 MG TABS TAKE 1 TABLET (420 MG) BY MOUTH DAILY. TAKE WITH A GLASS OF WATER THE SAME TIME EACH DAY. 28 tablet 0  ? Insulin Glargine (LANTUS SOLOSTAR) 100 UNIT/ML Solostar Pen INJECT 20 UNITS SUBCUTANEOUSLY AT BEDTIME 3 pen 11  ? INSULIN GLARGINE, 1 UNIT DIAL, [place] INJECT 20 UNITS  SUBCUTANEOUSLY DAILY    ? insulin glargine-yfgn (SEMGLEE) 100 UNIT/ML Pen INJECT 22 UNITS SUBCUTANEOUSLY AT BEDTIME (USE WITHIN 28 DAYS AFTER OPENING PEN) (CONVERTED FROM LANTUS)    ? lisinopril (PRINIVIL,ZESTRIL) 40 MG tablet Take 1 tablet (40 mg total) by mouth daily. 90 tablet 4  ? meloxicam (MOBIC) 7.5 MG tablet TAKE 1 TABLET (7.5 MG TOTAL) BY MOUTH DAILY. 30 tablet 1  ? ONE TOUCH ULTRA TEST test strip TEST GLUCOSE 3 TIMES A DAY 300 each 1  ? ONETOUCH DELICA LANCETS 01V MISC TEST GLUCOSE 3 TIMES A DAY 300 each 1  ? sildenafil (REVATIO) 20 MG tablet Take 1 to 2 tabs 2 - 3 hours before sex 30 tablet 11  ? simvastatin (ZOCOR) 20 MG tablet TAKE 1 TABLET (20 MG TOTAL) BY MOUTH AT BEDTIME. 100 tablet 4  ? ?No current facility-administered medications on file prior to visit.  ? ? ? ? ?PHYSICAL EXAMINATION: ? ? ? ? ? ? ? ? ? ? ? ?ECOG  0 ? ? ? ?General appearance: Alert, awake without any distress. ?Head: Atraumatic without abnormalities ?Oropharynx: Without any thrush or ulcers. ?Eyes: No scleral icterus. ?Lymph nodes: Small palpable nodule noted to left ankle of the jaw.  No lymphadenopathy detected. ?Heart:regular rate and rhythm, without any murmurs or gallops.   ?Lung: Clear to auscultation without any rhonchi, wheezes or dullness to percussion. ?Abdomin: Soft, nontender without any shifting dullness or ascites. ?Musculoskeletal: No clubbing or cyanosis. ?Neurological: No motor  or sensory deficits. ?Skin: No rashes or lesions. ? ? ? ? ? ? ? ? ? ? ? ?Labs:  ?Lab Results  ?Component Value Date  ? WBC 102.8 (HH) 05/14/2021  ? HGB 12.5 (L) 05/14/2021  ? HCT 39.1 05/14/2021  ? MCV 96.5 05/14/2021  ? PLT 187 05/14/2021  ? NEUTROABS 5.0 05/14/2021  ? ? ?  Chemistry   ?   ?Component Value Date/Time  ? NA 139 05/14/2021 1228  ? NA 142 01/01/2017 0809  ? K 4.0 05/14/2021 1228  ? K 4.4 01/01/2017 0809  ? CL 104 05/14/2021 1228  ? CL 102 04/25/2012 1003  ? CO2 28 05/14/2021 1228  ? CO2 26 01/01/2017 0809  ? BUN 12 05/14/2021  1228  ? BUN 15.7 01/01/2017 0809  ? CREATININE 1.02 05/14/2021 1228  ? CREATININE 0.9 01/01/2017 0809  ?    ?Component Value Date/Time  ? CALCIUM 9.0 05/14/2021 1228  ? CALCIUM 9.1 01/01/2017 0809  ? ALKPHOS 81 05/14/2021 1228  ? ALKPHOS 68 01/01/2017 0809  ? AST 24 05/14/2021 1228  ? AST 22 01/01/2017 0809  ? ALT 14 05/14/2021 1228  ? ALT 19 01/01/2017 0809  ? BILITOT 1.2 05/14/2021 1228  ? BILITOT 0.75 01/01/2017 0809  ?  ? ? ?  ? ?Assessment and plan: ? ?66 year old man with:  ? ? ?1.  Stage IIIa Small lymphocytic lymphoma diagnosed in 2012.   ? ?He is currently on ibrutinib although he has been noncompliant and does not take this medication regularly.  Alternative treatment options including Calquence, systemic chemotherapy possible spot radiation to any problematic lymph nodes.  After discussion today, I recommended obtaining CT scan for staging purposes and referral to radiation oncology. ? ? ?2.  Left jaw mass: Appears to be very small which could indicate a small lymph node versus a sebaceous gland.  We will obtain imaging studies to further quantify this.  Management options include observation versus surgical resection versus radiation therapy. ? ? ? ?3. Follow-up: He will return in the next 3 months for repeat follow-up. ? ? ?30  minutes were spent on this visit.  The time was dedicated to reviewing his disease status, treatment choices and addressing complications related to his cancer and cancer therapy. ? ?  ?[name], MD ?06/18/21 10:03 AM ?

## 2021-06-19 ENCOUNTER — Telehealth: Payer: Self-pay | Admitting: *Deleted

## 2021-06-19 NOTE — Telephone Encounter (Signed)
PC to patient, no answer, left VM - informed him his CT has been authorized by his insurance & he may schedule this at any time, MD would like for it to be in the next week.  Central Scheduling number given, [phone].  Instructed patient to call this office with any questions/concerns, [phone]. ?

## 2021-07-01 ENCOUNTER — Ambulatory Visit (HOSPITAL_COMMUNITY)
Admission: RE | Admit: 2021-07-01 | Discharge: 2021-07-01 | Disposition: A | Discharge status: Home / Self Care | Payer: Medicare Other | Source: Ambulatory Visit | Attending: Oncology | Admitting: Oncology

## 2021-07-01 DIAGNOSIS — C9110 Chronic lymphocytic leukemia of B-cell type not having achieved remission: Secondary | ICD-10-CM | POA: Diagnosis present

## 2021-07-01 LAB — POCT I-STAT CREATININE: Creatinine, Ser: 1.2 mg/dL (ref 0.61–1.24)

## 2021-07-01 MED ORDER — IOHEXOL 300 MG/ML  SOLN
75.0000 mL | Freq: Once | INTRAMUSCULAR | Status: AC | PRN
  Administered 2021-07-01: 75 mL via INTRAVENOUS

## 2021-07-02 ENCOUNTER — Telehealth: Payer: Self-pay | Admitting: *Deleted

## 2021-07-02 NOTE — Telephone Encounter (Signed)
-----   Message from Wyatt Portela, MD sent at 07/02/2021  8:54 AM EDT ----- Please let him know his [place] is unchanged. Nothing worrisome at this time

## 2021-07-02 NOTE — Telephone Encounter (Signed)
PC to patient, informed him of Dr Shadad's message below, he verbalizes understanding. 

## 2021-07-09 ENCOUNTER — Ambulatory Visit (INDEPENDENT_AMBULATORY_CARE_PROVIDER_SITE_OTHER): Payer: No Typology Code available for payment source | Admitting: Plastic Surgery

## 2021-07-09 VITALS — BP 148/84 | HR 98 | Ht 68.0 in | Wt 151.2 lb

## 2021-07-09 DIAGNOSIS — C9110 Chronic lymphocytic leukemia of B-cell type not having achieved remission: Secondary | ICD-10-CM | POA: Diagnosis not present

## 2021-07-09 DIAGNOSIS — R221 Localized swelling, mass and lump, neck: Secondary | ICD-10-CM

## 2021-07-09 NOTE — Progress Notes (Signed)
Referring Provider Administration, [place],  [place] [address]   CC:  Chief Complaint  Patient presents with   Consult      LOGON UTTECH is an 66 y.o. male.  HPI: Patient presents with a left neck mass.  This is been present for a few months.  He has a history of lymphoma and is being treated medically for this.  Given the new area of concern he was sent to me to evaluate for excision.  Patient would also like to investigate less invasive diagnostic approaches if appropriate.  No Known Allergies  Outpatient Encounter Medications as of 07/09/2021  Medication Sig Note   Blood Glucose Monitoring Suppl (ONE TOUCH ULTRA 2) W/DEVICE KIT See admin instructions. 08/02/2014: Received from: External Pharmacy   Cholecalciferol 50 MCG (2000 UT) TABS TAKE ONE TABLET BY MOUTH DAILY FOR LOW VITAMIN D.    doxepin (SINEQUAN) 25 MG capsule Take 1 capsule by mouth at bedtime.    finasteride (PROPECIA) 1 MG tablet Take 1 tablet by mouth daily.    finasteride (PROSCAR) 5 MG tablet Take 0.5 tablets by mouth daily.    Fluocin-Hydroquinone-Tretinoin (TRI-LUMA) 0.01-4-0.05 % CREA Apply 1 application. topically at bedtime.    ibrutinib (IMBRUVICA) 420 MG TABS TAKE 1 TABLET (420 MG) BY MOUTH DAILY. TAKE WITH A GLASS OF WATER THE SAME TIME EACH DAY.    Insulin Glargine (LANTUS SOLOSTAR) 100 UNIT/ML Solostar Pen INJECT 20 UNITS SUBCUTANEOUSLY AT BEDTIME    INSULIN GLARGINE, 1 UNIT DIAL, [place] INJECT 20 UNITS SUBCUTANEOUSLY DAILY    insulin glargine-yfgn (SEMGLEE) 100 UNIT/ML Pen INJECT 22 UNITS SUBCUTANEOUSLY AT BEDTIME (USE WITHIN 28 DAYS AFTER OPENING PEN) (CONVERTED FROM LANTUS)    lisinopril (PRINIVIL,ZESTRIL) 40 MG tablet Take 1 tablet (40 mg total) by mouth daily.    meloxicam (MOBIC) 7.5 MG tablet TAKE 1 TABLET (7.5 MG TOTAL) BY MOUTH DAILY.    ONE TOUCH ULTRA TEST test strip TEST GLUCOSE 3 TIMES A DAY    ONETOUCH DELICA LANCETS 58K MISC TEST GLUCOSE 3 TIMES A DAY    sildenafil  (REVATIO) 20 MG tablet Take 1 to 2 tabs 2 - 3 hours before sex    simvastatin (ZOCOR) 20 MG tablet TAKE 1 TABLET (20 MG TOTAL) BY MOUTH AT BEDTIME.    No facility-administered encounter medications on file as of 07/09/2021.     Past Medical History:  Diagnosis Date   Allergy    Arthritis    CLL (chronic lymphocytic leukemia) ([place])    stage III: asymptomatic; on observation   DIABETES MELLITUS, TYPE I, UNCONTROLLED 12/30/2006   insulin   Diabetic retinopathy    ERECTILE DYSFUNCTION, ORGANIC 12/30/2006   Hx of adenomatous polyp of colon 05/24/2014   HYPERLIPIDEMIA 12/30/2006   HYPERTENSION 12/30/2006   LOW BACK PAIN, ACUTE 12/21/2006    Past Surgical History:  Procedure Laterality Date   COLONOSCOPY     great right toe- Fused the I P joint      Family History  Problem Relation Age of Onset   Alcohol abuse Other    Colon cancer Brother 46       Died 09-2013   Diabetes Mellitus II Mother    Esophageal cancer Neg Hx    Rectal cancer Neg Hx    Stomach cancer Neg Hx     Social History   Social History Narrative   Not on file     Review of Systems General: Denies fevers, chills, weight loss CV: Denies  chest pain, shortness of breath, palpitations  Physical Exam    07/09/2021   11:01 AM 06/18/2021   10:14 AM 05/14/2021    1:07 PM  Vitals with BMI  Height _0  5' 8.5" 5' 8.5"  Weight 151 lbs 3 oz 148 lbs 3 oz 154 lbs 13 oz  BMI 23 81.4 48.18  Systolic 563 149 702  Diastolic 84 75 77  Pulse 98 98 88    General:  No acute distress,  Alert and oriented, Non-Toxic, Normal speech and affect Examination shows a 1 to 2 cm freely mobile nodule just posterior to the ankle on the left side of his mandible.  No overlying skin changes.  Feels like a lymph node.  Assessment/Plan Patient presents with a new nodular lesion posterior to the angle of the left side of his mandible.  Possible this is persistent lymphoma manifesting in that spot.  I discussed surgical removal versus  needle biopsy.  Patient would like to move forward with needle biopsy to see if any further intervention is indicated.  If it is consistent with his previous diagnosis then no further intervention would be required.  I have discussed this with his oncologist Dr. Alen Blew who agrees with the plan.  We will plan to move forward with needle biopsy.  Cindra Presume 07/09/2021, 12:55 PM

## 2021-07-11 ENCOUNTER — Telehealth: Payer: Self-pay | Admitting: Plastic Surgery

## 2021-07-11 NOTE — Telephone Encounter (Signed)
Pt is calling in stating that he will bring in his [place] voucher to be placed in his file, so that his 07/09/2021 can be filed with the [place] and not to his private insurance ([employer]).  He did not receive the voucher until after his visit.

## 2021-07-15 ENCOUNTER — Ambulatory Visit: Payer: Medicare Other | Admitting: Dermatology

## 2021-09-02 ENCOUNTER — Telehealth: Payer: Self-pay | Admitting: Oncology

## 2021-09-02 NOTE — Telephone Encounter (Signed)
Per 7/25 phone line.  Pt called to r/s appointment  r/s per pt request

## 2021-09-10 ENCOUNTER — Inpatient Hospital Stay: Payer: No Typology Code available for payment source

## 2021-09-10 ENCOUNTER — Inpatient Hospital Stay: Payer: No Typology Code available for payment source | Admitting: Oncology

## 2021-10-14 ENCOUNTER — Telehealth: Payer: Self-pay

## 2021-10-14 ENCOUNTER — Inpatient Hospital Stay (HOSPITAL_BASED_OUTPATIENT_CLINIC_OR_DEPARTMENT_OTHER): Payer: No Typology Code available for payment source | Admitting: Oncology

## 2021-10-14 ENCOUNTER — Other Ambulatory Visit: Payer: Self-pay

## 2021-10-14 ENCOUNTER — Telehealth: Payer: Self-pay | Admitting: Pharmacy Technician

## 2021-10-14 ENCOUNTER — Inpatient Hospital Stay: Payer: No Typology Code available for payment source | Attending: Oncology

## 2021-10-14 ENCOUNTER — Other Ambulatory Visit (HOSPITAL_COMMUNITY): Payer: Self-pay

## 2021-10-14 VITALS — BP 117/65 | HR 94 | Temp 97.9°F | Resp 19 | Ht 68.0 in | Wt 141.3 lb

## 2021-10-14 DIAGNOSIS — C9110 Chronic lymphocytic leukemia of B-cell type not having achieved remission: Secondary | ICD-10-CM

## 2021-10-14 DIAGNOSIS — Z79899 Other long term (current) drug therapy: Secondary | ICD-10-CM | POA: Diagnosis not present

## 2021-10-14 LAB — CBC WITH DIFFERENTIAL (CANCER CENTER ONLY)
Abs Immature Granulocytes: 0.22 10*3/uL — ABNORMAL HIGH (ref 0.00–0.07)
Basophils Absolute: 0.1 10*3/uL (ref 0.0–0.1)
Basophils Relative: 0 %
Eosinophils Absolute: 0.1 10*3/uL (ref 0.0–0.5)
Eosinophils Relative: 0 %
HCT: 44.4 % (ref 39.0–52.0)
Hemoglobin: 13.7 g/dL (ref 13.0–17.0)
Immature Granulocytes: 0 %
Lymphocytes Relative: 94 %
Lymphs Abs: 233 10*3/uL — ABNORMAL HIGH (ref 0.7–4.0)
MCH: 30.6 pg (ref 26.0–34.0)
MCHC: 30.9 g/dL (ref 30.0–36.0)
MCV: 99.3 fL (ref 80.0–100.0)
Monocytes Absolute: 11.4 10*3/uL — ABNORMAL HIGH (ref 0.1–1.0)
Monocytes Relative: 5 %
Neutro Abs: 2.6 10*3/uL (ref 1.7–7.7)
Neutrophils Relative %: 1 %
Platelet Count: 62 10*3/uL — ABNORMAL LOW (ref 150–400)
RBC: 4.47 MIL/uL (ref 4.22–5.81)
RDW: 17.4 % — ABNORMAL HIGH (ref 11.5–15.5)
WBC Count: 247.4 10*3/uL (ref 4.0–10.5)
nRBC: 0 % (ref 0.0–0.2)

## 2021-10-14 LAB — CMP (CANCER CENTER ONLY)
ALT: 12 U/L (ref 0–44)
AST: 21 U/L (ref 15–41)
Albumin: 4.6 g/dL (ref 3.5–5.0)
Alkaline Phosphatase: 111 U/L (ref 38–126)
Anion gap: 7 (ref 5–15)
BUN: 25 mg/dL — ABNORMAL HIGH (ref 8–23)
CO2: 27 mmol/L (ref 22–32)
Calcium: 9.4 mg/dL (ref 8.9–10.3)
Chloride: 103 mmol/L (ref 98–111)
Creatinine: 1.38 mg/dL — ABNORMAL HIGH (ref 0.61–1.24)
GFR, Estimated: 57 mL/min — ABNORMAL LOW (ref >60)
Glucose, Bld: 130 mg/dL — ABNORMAL HIGH (ref 70–99)
Potassium: 4.2 mmol/L (ref 3.5–5.1)
Sodium: 137 mmol/L (ref 135–145)
Total Bilirubin: 1.5 mg/dL — ABNORMAL HIGH (ref 0.3–1.2)
Total Protein: 6.8 g/dL (ref 6.5–8.1)

## 2021-10-14 MED ORDER — CALQUENCE 100 MG PO CAPS
100.0000 mg | ORAL_CAPSULE | Freq: Two times a day (BID) | ORAL | 3 refills | Status: DC
  Filled 2021-10-14: qty 60, 30d supply, fill #0

## 2021-10-14 NOTE — Progress Notes (Signed)
[place] OFFICE PROGRESS NOTE 10/14/21  Tyler Mellow, MD [address] [place] [address]  DIAGNOSIS: 66 year old man with CLL diagnosed in 2012.  He was found to have lymphocytosis and lymphadenopathy.    Prior therapy:  Bendamustine and rituximab started in July 2017 and received total of 2 cycles. Therapy discontinued in August 2017 based on patient's wishes after achieving partial response.  He developed a relapse disease in April 2019.    CURRENT THERAPY: Ibrutinib 420 mg daily started in April 2019.  INTERVAL HISTORY: Tyler Scott returns today for a follow-up visit.  Since last visit, he reports no major complaints.  He has not reported more weight loss and fatigue and has been noncompliant with ibrutinib.  He reports GI complications including nausea and diarrhea and fatigue.  Due to these complaints he has been noncompliant with this medication.  He denies any lymphadenopathy or bleeding.  He denies any hospitalizations or illnesses.     ALLERGIES:  has No Known Allergies.  MEDICATIONS: Reviewed without changes.  Current Outpatient Medications on File Prior to Visit  Medication Sig Dispense Refill   Blood Glucose Monitoring Suppl (ONE TOUCH ULTRA 2) W/DEVICE KIT See admin instructions.  0   Cholecalciferol 50 MCG (2000 UT) TABS TAKE ONE TABLET BY MOUTH DAILY FOR LOW VITAMIN D.     doxepin (SINEQUAN) 25 MG capsule Take 1 capsule by mouth at bedtime.     finasteride (PROPECIA) 1 MG tablet Take 1 tablet by mouth daily.     finasteride (PROSCAR) 5 MG tablet Take 0.5 tablets by mouth daily.     Fluocin-Hydroquinone-Tretinoin (TRI-LUMA) 0.01-4-0.05 % CREA Apply 1 application. topically at bedtime. 30 g 2   ibrutinib (IMBRUVICA) 420 MG TABS TAKE 1 TABLET (420 MG) BY MOUTH DAILY. TAKE WITH A GLASS OF WATER THE SAME TIME EACH DAY. 28 tablet 0   Insulin Glargine (LANTUS SOLOSTAR) 100 UNIT/ML Solostar Pen INJECT 20 UNITS SUBCUTANEOUSLY  AT BEDTIME 3 pen 11   INSULIN GLARGINE, 1 UNIT DIAL, [place] INJECT 20 UNITS SUBCUTANEOUSLY DAILY     insulin glargine-yfgn (SEMGLEE) 100 UNIT/ML Pen INJECT 22 UNITS SUBCUTANEOUSLY AT BEDTIME (USE WITHIN 28 DAYS AFTER OPENING PEN) (CONVERTED FROM LANTUS)     lisinopril (PRINIVIL,ZESTRIL) 40 MG tablet Take 1 tablet (40 mg total) by mouth daily. 90 tablet 4   meloxicam (MOBIC) 7.5 MG tablet TAKE 1 TABLET (7.5 MG TOTAL) BY MOUTH DAILY. 30 tablet 1   ONE TOUCH ULTRA TEST test strip TEST GLUCOSE 3 TIMES A DAY 300 each 1   ONETOUCH DELICA LANCETS 83M MISC TEST GLUCOSE 3 TIMES A DAY 300 each 1   sildenafil (REVATIO) 20 MG tablet Take 1 to 2 tabs 2 - 3 hours before sex 30 tablet 11   simvastatin (ZOCOR) 20 MG tablet TAKE 1 TABLET (20 MG TOTAL) BY MOUTH AT BEDTIME. 100 tablet 4   No current facility-administered medications on file prior to visit.      PHYSICAL EXAMINATION:      Blood pressure 117/65, pulse 94, temperature 97.9 F (36.6 C), temperature source Oral, resp. rate 19, height _0  (1.727 m), weight 141 lb 4.8 oz (64.1 kg), SpO2 100 %.       ECOG  0     General appearance: Comfortable appearing without any discomfort Head: Normocephalic without any trauma Oropharynx: Mucous membranes are moist and pink without any thrush or ulcers. Eyes: Pupils are equal and round reactive to light. Lymph nodes: No cervical, supraclavicular, inguinal  or axillary lymphadenopathy.   Heart:regular rate and rhythm.  S1 and S2 without leg edema. Lung: Clear without any rhonchi or wheezes.  No dullness to percussion. Abdomin: Soft, nontender, nondistended with good bowel sounds.  No hepatosplenomegaly. Musculoskeletal: No joint deformity or effusion.  Full range of motion noted. Neurological: No deficits noted on motor, sensory and deep tendon reflex exam. Skin: No petechial rash or dryness.  Appeared moist.              Labs:  Lab Results  Component Value Date   WBC 102.8 (HH)  05/14/2021   HGB 12.5 (L) 05/14/2021   HCT 39.1 05/14/2021   MCV 96.5 05/14/2021   PLT 187 05/14/2021   NEUTROABS 5.0 05/14/2021      Chemistry      Component Value Date/Time   NA 139 05/14/2021 1228   NA 142 01/01/2017 0809   K 4.0 05/14/2021 1228   K 4.4 01/01/2017 0809   CL 104 05/14/2021 1228   CL 102 04/25/2012 1003   CO2 28 05/14/2021 1228   CO2 26 01/01/2017 0809   BUN 12 05/14/2021 1228   BUN 15.7 01/01/2017 0809   CREATININE 1.20 07/01/2021 1022   CREATININE 1.02 05/14/2021 1228   CREATININE 0.9 01/01/2017 0809      Component Value Date/Time   CALCIUM 9.0 05/14/2021 1228   CALCIUM 9.1 01/01/2017 0809   ALKPHOS 81 05/14/2021 1228   ALKPHOS 68 01/01/2017 0809   AST 24 05/14/2021 1228   AST 22 01/01/2017 0809   ALT 14 05/14/2021 1228   ALT 19 01/01/2017 0809   BILITOT 1.2 05/14/2021 1228   BILITOT 0.75 01/01/2017 0809         Assessment and plan:  66 year old man with:    1.  CLL /SLL diagnosed in 2012.  He has stage IIIa disease with lymphadenopathy.  The course of this disease was reviewed and treatment choices were reiterated.  He has been on ibrutinib although very noncompliant with it due to side effects and adherence issues.  Alternative treatment options including chemotherapy versus switching to a different oral targeted therapy such as Calquence were reiterated.  Laboratory data reviewed today and showed rapid increase in his white cell count which is consistent with progressive CLL.  See no evidence to suggest transformation to a higher grade disease.  After discussion today we opted to switch him to [facility] and continue to emphasize the importance of adherence.  Alternative options would be the use of venetoclax as a single agent or in combination.  We will defer the combination option this time.   2.  Left jaw mass: Resolved at this time.  No evidence of lymphadenopathy.   3. Follow-up: In 2 months for a repeat follow-up.   30  minutes  were dedicated to this encounter.  The time was spent on reviewing laboratory data, disease status update and outlining future plan of care discussion.    [name], MD 10/14/21 8:50 AM

## 2021-10-14 NOTE — Progress Notes (Signed)
CRITICAL VALUE STICKER  CRITICAL VALUE: WBC 247.4  RECEIVER (on-site recipient of call): Velna Ochs RN  DATE & TIME NOTIFIED: 10/14/21 @ 0906  MESSENGER (representative from lab): Pam  MD NOTIFIED: Dr Alen Blew  TIME OF NOTIFICATION:  0906  RESPONSE:  MD aware

## 2021-10-14 NOTE — Telephone Encounter (Signed)
Oral Oncology Patient Advocate Encounter  After completing a benefits investigation, prior authorization for Calquence is not required at this time through [place].  Patient has an active referral with the [place].     Lady Deutscher, CPhT-Adv Oncology Pharmacy [facility] [place] Direct Number: [phone]  Fax: [phone]

## 2021-10-14 NOTE — Telephone Encounter (Signed)
Oral Oncology Pharmacist Encounter  Received new prescription for acalabrutinib (Calquence) for the treatment of CLL, planned duration until disease progression or unacceptable toxicity.  Labs from 10/14/2021 (CBC and CMP) assessed, platelet count decreased to 62K and creatinine elevated, no dose interventions needed- will monitor.   Prescription dose and frequency assessed.  Will have MD send in prescription for tablets instead of capsules due to discontinuation of capsules.  Current medication list in Epic reviewed, DDIs with Calquence identified:  - meloxicam: calquence can increase antiplatelet effect of meloxicam. Will have patient monitor for any signs of bleeding.   Evaluated chart and no patient barriers to medication adherence noted.   Patient agreement for treatment documented in MD note on 10/14/2021.  Prescription has been e-scribed to the [facility] for benefits analysis and approval.  Oral Oncology Clinic will continue to follow for insurance authorization, copayment issues, initial counseling and start date.  Drema Halon, PharmD Hematology/Oncology Clinical Pharmacist [facility] [phone] 10/14/2021 9:41 AM

## 2021-10-16 ENCOUNTER — Other Ambulatory Visit (HOSPITAL_COMMUNITY): Payer: Self-pay

## 2021-10-16 MED ORDER — CALQUENCE 100 MG PO TABS: 100.0000 mg | ORAL_TABLET | Freq: Two times a day (BID) | ORAL | 3 refills | Status: DC

## 2021-10-21 ENCOUNTER — Telehealth: Payer: Self-pay | Admitting: Pharmacy Technician

## 2021-10-21 NOTE — Telephone Encounter (Signed)
Oral Oncology Patient Advocate Encounter   Received notification that prior authorization for Calquence is required by the [place]   PA submitted on 10/21/2021 via efax [phone] Status is pending     Lady Deutscher, CPhT-Adv Oncology Pharmacy Patient [place] Direct Number: [phone]  Fax: [phone]

## 2021-10-23 NOTE — Telephone Encounter (Signed)
Oral Oncology Pharmacist Encounter  Attempted to reach the [facility] to see if additional questions have been approved for patient to receive medication through the [place]. Left voicemail.   Drema Halon, PharmD Hematology/Oncology Clinical Pharmacist [facility] [phone]

## 2021-10-28 NOTE — Telephone Encounter (Signed)
Oral Oncology Patient Advocate Encounter  Prior Authorization for Calquence has been approved.    Effective dates: 10/28/2021 through 10/29/2022  Patient is being set up for shipment.    Tyler Scott, CPhT-Adv Oncology Pharmacy Patient [place] Direct Number: [phone]  Fax: [phone]

## 2021-10-29 NOTE — Telephone Encounter (Signed)
Oral Chemotherapy Pharmacist Encounter  I spoke with patient for overview of: Calquence (acalabrutinib) for the treatment of CLL, planned duration until disease progression or unacceptable toxicity.   Counseled patient on administration, dosing, side effects, monitoring, drug-food interactions, safe handling, storage, and disposal.  Patient will take Calquence '100mg'$  tablets, 1 tablet by mouth approximately 12 hours apart, with or with out food, with a glass of water.  Patient does not take the meloxicam regularly and instead uses it as needed. Patient still informed of the interaction and states he will likely take tylenol instead.   Calquence start date: once patient receives from the [place] (it is being shipped to patients address as of 10/29/21)- patient will notify Dr. Danise Mina RN once they receive the medication  Adverse effects include but are not limited to: headache, diarrhea, fatigue, rash, muscle pain, bruising, decreased blood counts, and altered cardiac conduction.    Reviewed with patient importance of keeping a medication schedule and plan for any missed doses. No barriers to medication adherence identified.  Medication reconciliation performed and medication/allergy list updated.  Insurance authorization for [employer] has been obtained. Patient receives medication from the [place].   Patient informed the pharmacy will reach out 5-7 days prior to needing next fill of Calquence to coordinate continued medication acquisition to prevent break in therapy.  All questions answered.  Tyler Scott and his wife voiced understanding and appreciation.   Medication education handout placed in mail for patient. Patient knows to call the office with questions or concerns. Oral Chemotherapy Clinic phone number provided to patient.   Drema Halon, PharmD Hematology/Oncology Clinical Pharmacist [facility] [phone] 10/29/2021   2:53 PM

## 2021-11-14 ENCOUNTER — Telehealth: Payer: Self-pay | Admitting: Oncology

## 2021-11-14 NOTE — Telephone Encounter (Signed)
Spoke with patient spouse confirming 11/6 rescheduled appointment

## 2021-12-09 ENCOUNTER — Ambulatory Visit: Payer: No Typology Code available for payment source | Admitting: Oncology

## 2021-12-09 ENCOUNTER — Other Ambulatory Visit: Payer: No Typology Code available for payment source

## 2021-12-11 ENCOUNTER — Ambulatory Visit (AMBULATORY_SURGERY_CENTER): Payer: Self-pay | Admitting: *Deleted

## 2021-12-11 VITALS — Ht 68.0 in | Wt 148.0 lb

## 2021-12-11 DIAGNOSIS — Z800 Family history of malignant neoplasm of digestive organs: Secondary | ICD-10-CM

## 2021-12-11 DIAGNOSIS — Z86010 Personal history of colonic polyps: Secondary | ICD-10-CM

## 2021-12-11 MED ORDER — SUTAB 1479-225-188 MG PO TABS: 1.0000 | ORAL_TABLET | Freq: Once | ORAL | 0 refills | Status: AC

## 2021-12-11 NOTE — Progress Notes (Signed)
Pt's previsit is done over the phone and all paperwork (prep instructions, blank consent form to just read over) sent to patient.  Pt's name and DOB verified at the beginning of the previsit.  Pt denies any difficulty with ambulating.   No egg or soy allergy known to patient  No issues known to pt with past sedation with any surgeries or procedures Pt denies trouble moving neck No FH of Malignant Hyperthermia Pt is not on diet pills Pt is not on  home 02  Pt is not on blood thinners  Pt denies issues with constipation   Pt requests pills.  Sutab coupon sent with pt's paperwork

## 2021-12-15 ENCOUNTER — Other Ambulatory Visit: Payer: Self-pay

## 2021-12-15 ENCOUNTER — Inpatient Hospital Stay: Payer: No Typology Code available for payment source | Attending: Oncology

## 2021-12-15 ENCOUNTER — Telehealth: Payer: Self-pay

## 2021-12-15 ENCOUNTER — Inpatient Hospital Stay (HOSPITAL_BASED_OUTPATIENT_CLINIC_OR_DEPARTMENT_OTHER): Payer: No Typology Code available for payment source | Admitting: Oncology

## 2021-12-15 VITALS — BP 124/66 | HR 94 | Temp 98.1°F | Resp 16 | Wt 145.4 lb

## 2021-12-15 DIAGNOSIS — C9110 Chronic lymphocytic leukemia of B-cell type not having achieved remission: Secondary | ICD-10-CM

## 2021-12-15 LAB — CMP (CANCER CENTER ONLY)
ALT: 12 U/L (ref 0–44)
AST: 19 U/L (ref 15–41)
Albumin: 4.7 g/dL (ref 3.5–5.0)
Alkaline Phosphatase: 76 U/L (ref 38–126)
Anion gap: 8 (ref 5–15)
BUN: 15 mg/dL (ref 8–23)
CO2: 26 mmol/L (ref 22–32)
Calcium: 9.3 mg/dL (ref 8.9–10.3)
Chloride: 105 mmol/L (ref 98–111)
Creatinine: 0.97 mg/dL (ref 0.61–1.24)
GFR, Estimated: >60 mL/min (ref >60)
Glucose, Bld: 106 mg/dL — ABNORMAL HIGH (ref 70–99)
Potassium: 4.5 mmol/L (ref 3.5–5.1)
Sodium: 139 mmol/L (ref 135–145)
Total Bilirubin: 1.1 mg/dL (ref 0.3–1.2)
Total Protein: 7 g/dL (ref 6.5–8.1)

## 2021-12-15 LAB — CBC WITH DIFFERENTIAL (CANCER CENTER ONLY)
Abs Immature Granulocytes: 0.25 10*3/uL — ABNORMAL HIGH (ref 0.00–0.07)
Basophils Absolute: 0.1 10*3/uL (ref 0.0–0.1)
Basophils Relative: 0 %
Eosinophils Absolute: 0.1 10*3/uL (ref 0.0–0.5)
Eosinophils Relative: 0 %
HCT: 40.3 % (ref 39.0–52.0)
Hemoglobin: 12.2 g/dL — ABNORMAL LOW (ref 13.0–17.0)
Immature Granulocytes: 0 %
Lymphocytes Relative: 97 %
Lymphs Abs: 216.9 10*3/uL — ABNORMAL HIGH (ref 0.7–4.0)
MCH: 31.3 pg (ref 26.0–34.0)
MCHC: 30.3 g/dL (ref 30.0–36.0)
MCV: 103.3 fL — ABNORMAL HIGH (ref 80.0–100.0)
Monocytes Absolute: 2.4 10*3/uL — ABNORMAL HIGH (ref 0.1–1.0)
Monocytes Relative: 1 %
Neutro Abs: 4.1 10*3/uL (ref 1.7–7.7)
Neutrophils Relative %: 2 %
Platelet Count: 146 10*3/uL — ABNORMAL LOW (ref 150–400)
RBC: 3.9 MIL/uL — ABNORMAL LOW (ref 4.22–5.81)
RDW: 18.3 % — ABNORMAL HIGH (ref 11.5–15.5)
Smear Review: NORMAL
WBC Count: 223.8 10*3/uL (ref 4.0–10.5)
nRBC: 0 % (ref 0.0–0.2)

## 2021-12-15 NOTE — Progress Notes (Signed)
[facility] OFFICE PROGRESS NOTE 12/15/21  Scott, Christopher, MD [address] [place] [address]  DIAGNOSIS: 66-year-old man with CLL presented with lymphocytosis and adenopathy diagnosed in 2012.   Prior therapy:  Bendamustine and rituximab started in July 2017 and received total of 2 cycles. Therapy discontinued in August 2017 based on patient's wishes after achieving partial response.  He developed a relapse disease in April 2019. Ibrutinib 420 mg daily started in April 2019 and discontinued in September 2023 after poor adherence.    CURRENT THERAPY: Calquence 100 mg twice daily started in September 2023.  INTERVAL HISTORY: Tyler Scott is here for a follow-up visit.  Since the last visit, he started Calquence and has tolerated it without any major complaints.  He denies any nausea, vomiting or abdominal pain.  He denies any bruising or palpitation.  He denies any lymphadenopathy or constitutional symptoms.  Denies excessive fatigue or weakness.     ALLERGIES:  has No Known Allergies.  MEDICATIONS: Updated on review.  Current Outpatient Medications on File Prior to Visit  Medication Sig Dispense Refill   acalabrutinib maleate (CALQUENCE) 100 MG TABS Take 100 mg by mouth 2 (two) times daily. 60 tablet 3   Blood Glucose Monitoring Suppl (ONE TOUCH ULTRA 2) W/DEVICE KIT See admin instructions.  0   Cholecalciferol 50 MCG (2000 UT) TABS TAKE ONE TABLET BY MOUTH DAILY FOR LOW VITAMIN D.     doxepin (SINEQUAN) 25 MG capsule Take 1 capsule by mouth at bedtime.     empagliflozin (JARDIANCE) 25 MG TABS tablet Take 0.5 tablets by mouth every morning. Only if blood sugars get too high     finasteride (PROPECIA) 1 MG tablet Take 1 tablet by mouth daily.     finasteride (PROSCAR) 5 MG tablet Take 0.5 tablets by mouth daily.     Fluocin-Hydroquinone-Tretinoin (TRI-LUMA) 0.01-4-0.05 % CREA Apply 1 application. topically at bedtime. (Patient not taking:  Reported on 12/11/2021) 30 g 2   Insulin Glargine (LANTUS SOLOSTAR) 100 UNIT/ML Solostar Pen INJECT 20 UNITS SUBCUTANEOUSLY AT BEDTIME (Patient not taking: Reported on 12/11/2021) 3 pen 11   INSULIN GLARGINE, 1 UNIT DIAL, [place] INJECT 20 UNITS SUBCUTANEOUSLY DAILY (Patient not taking: Reported on 12/11/2021)     insulin glargine-yfgn (SEMGLEE) 100 UNIT/ML Pen INJECT 22 UNITS SUBCUTANEOUSLY AT BEDTIME (USE WITHIN 28 DAYS AFTER OPENING PEN) (CONVERTED FROM LANTUS)     lisinopril (PRINIVIL,ZESTRIL) 40 MG tablet Take 1 tablet (40 mg total) by mouth daily. 90 tablet 4   meloxicam (MOBIC) 7.5 MG tablet TAKE 1 TABLET (7.5 MG TOTAL) BY MOUTH DAILY. (Patient taking differently: TAKE 1 TABLET (7.5 MG TOTAL) BY MOUTH DAILY. As needed) 30 tablet 1   ONE TOUCH ULTRA TEST test strip TEST GLUCOSE 3 TIMES A DAY 300 each 1   ONETOUCH DELICA LANCETS 33G MISC TEST GLUCOSE 3 TIMES A DAY 300 each 1   sildenafil (REVATIO) 20 MG tablet Take 1 to 2 tabs 2 - 3 hours before sex 30 tablet 11   simvastatin (ZOCOR) 20 MG tablet TAKE 1 TABLET (20 MG TOTAL) BY MOUTH AT BEDTIME. 100 tablet 4   No current facility-administered medications on file prior to visit.      PHYSICAL EXAMINATION:      Blood pressure 124/66, pulse 94, temperature 98.1 F (36.7 C), temperature source Temporal, resp. rate 16, weight 145 lb 6.4 oz (66 kg), SpO2 100 %.        ECOG  0    General   appearance: Alert, awake without any distress. Head: Atraumatic without abnormalities Oropharynx: Without any thrush or ulcers. Eyes: No scleral icterus. Lymph nodes: No lymphadenopathy noted in the cervical, supraclavicular, or axillary nodes Heart:regular rate and rhythm, without any murmurs or gallops.   Lung: Clear to auscultation without any rhonchi, wheezes or dullness to percussion. Abdomin: Soft, nontender without any shifting dullness or ascites. Musculoskeletal: No clubbing or cyanosis. Neurological: No motor or sensory deficits. Skin: No  rashes or lesions.              Labs:  Lab Results  Component Value Date   WBC 247.4 (HH) 10/14/2021   HGB 13.7 10/14/2021   HCT 44.4 10/14/2021   MCV 99.3 10/14/2021   PLT 62 (L) 10/14/2021   NEUTROABS 2.6 10/14/2021      Chemistry      Component Value Date/Time   NA 137 10/14/2021 0850   NA 142 01/01/2017 0809   K 4.2 10/14/2021 0850   K 4.4 01/01/2017 0809   CL 103 10/14/2021 0850   CL 102 04/25/2012 1003   CO2 27 10/14/2021 0850   CO2 26 01/01/2017 0809   BUN 25 (H) 10/14/2021 0850   BUN 15.7 01/01/2017 0809   CREATININE 1.38 (H) 10/14/2021 0850   CREATININE 0.9 01/01/2017 0809      Component Value Date/Time   CALCIUM 9.4 10/14/2021 0850   CALCIUM 9.1 01/01/2017 0809   ALKPHOS 111 10/14/2021 0850   ALKPHOS 68 01/01/2017 0809   AST 21 10/14/2021 0850   AST 22 01/01/2017 0809   ALT 12 10/14/2021 0850   ALT 19 01/01/2017 0809   BILITOT 1.5 (H) 10/14/2021 0850   BILITOT 0.75 01/01/2017 0809         Assessment and plan:  66 year old man with:    1.  CLL /SLL presented with stage IIIa disease and lymphadenopathy in 2012.  He is currently on Calquence and treatment choices were reiterated at this time.  Complication associated with this treatment were discussed including GI toxicity, headaches, bleeding and cardiac complications.  Alternative treatment options including venetoclax or systemic chemotherapy.  After discussion today he is agreeable to continue.  CBC from today showed improvement in his white cell count and near normalization of his platelets.   2.  Lymphadenopathy: Resolved at this time without any cervical or supraclavicular involvement.   3. Follow-up: In 2 months for a follow-up.   30  minutes were spent on this visit.  The time was dedicated to reviewing laboratory data, disease status update and outlining future plan of care discussion.    [name], MD 12/15/21 9:38 AM

## 2021-12-15 NOTE — Telephone Encounter (Signed)
CRITICAL VALUE STICKER  CRITICAL VALUE:   WBC  223.8  RECEIVER (on-site recipient of call):   Bonnita Nasuti, [place] NOTIFIED: 09:48  MESSENGER (representative from lab):  Nira Conn  MD NOTIFIED: Alen Blew  TIME OF NOTIFICATION:09:51

## 2021-12-25 ENCOUNTER — Telehealth: Payer: Self-pay | Admitting: Internal Medicine

## 2021-12-25 NOTE — Telephone Encounter (Signed)
Pt states that the RX for sutabs was not at the pharmacy.  He then asked me how much the RX would be.  I advised him that it could be as much as $50 but that it was difficult to say exactly how much.  Discussed other options with patient.  He would like to use Miralax.  He did not wish to come pick up new instructions and does not have my chart so I will mail new instructions to him.

## 2021-12-25 NOTE — Telephone Encounter (Signed)
Patient called states he has not heard from [facility] pharmacy order in [place] is not complete for the Sutab prep.

## 2021-12-29 ENCOUNTER — Encounter: Payer: Self-pay | Admitting: Internal Medicine

## 2021-12-30 ENCOUNTER — Encounter (INDEPENDENT_AMBULATORY_CARE_PROVIDER_SITE_OTHER): Payer: Medicare Other | Admitting: Ophthalmology

## 2021-12-30 DIAGNOSIS — H43813 Vitreous degeneration, bilateral: Secondary | ICD-10-CM

## 2021-12-30 DIAGNOSIS — I10 Essential (primary) hypertension: Secondary | ICD-10-CM | POA: Diagnosis not present

## 2021-12-30 DIAGNOSIS — H35033 Hypertensive retinopathy, bilateral: Secondary | ICD-10-CM

## 2021-12-30 DIAGNOSIS — E113593 Type 2 diabetes mellitus with proliferative diabetic retinopathy without macular edema, bilateral: Secondary | ICD-10-CM | POA: Diagnosis not present

## 2022-01-06 ENCOUNTER — Ambulatory Visit (AMBULATORY_SURGERY_CENTER): Payer: Medicare Other | Admitting: Internal Medicine

## 2022-01-06 ENCOUNTER — Encounter: Payer: Self-pay | Admitting: Internal Medicine

## 2022-01-06 VITALS — BP 114/70 | HR 74 | Temp 98.0°F | Resp 20 | Ht 68.0 in | Wt 148.0 lb

## 2022-01-06 DIAGNOSIS — D124 Benign neoplasm of descending colon: Secondary | ICD-10-CM | POA: Diagnosis not present

## 2022-01-06 DIAGNOSIS — D123 Benign neoplasm of transverse colon: Secondary | ICD-10-CM | POA: Diagnosis not present

## 2022-01-06 DIAGNOSIS — Z09 Encounter for follow-up examination after completed treatment for conditions other than malignant neoplasm: Secondary | ICD-10-CM | POA: Diagnosis not present

## 2022-01-06 DIAGNOSIS — Z800 Family history of malignant neoplasm of digestive organs: Secondary | ICD-10-CM | POA: Diagnosis not present

## 2022-01-06 DIAGNOSIS — Z86010 Personal history of colonic polyps: Secondary | ICD-10-CM | POA: Diagnosis not present

## 2022-01-06 MED ORDER — SODIUM CHLORIDE 0.9 % IV SOLN: 500.0000 mL | Freq: Once | INTRAVENOUS | Status: DC

## 2022-01-06 NOTE — Op Note (Signed)
[facility] Patient Name: Tyler Scott Procedure Date: 01/06/2022 8:25 AM MRN: [id number] Endoscopist: Gatha Mayer , MD, [id number] Age: 66 Referring MD:  Date of Birth: 09/11/1955 Gender: Male Account #: [id number] Procedure:                Colonoscopy Indications:              Surveillance: Personal history of adenomatous                            polyps on last colonoscopy > 5 years ago, FHx CRCA                            brother age 5 Last colonoscopy: 2016 Medicines:                Monitored Anesthesia Care Procedure:                Pre-Anesthesia Assessment:                           - Prior to the procedure, a History and Physical                            was performed, and patient medications and                            allergies were reviewed. The patient's tolerance of                            previous anesthesia was also reviewed. The risks                            and benefits of the procedure and the sedation                            options and risks were discussed with the patient.                            All questions were answered, and informed consent                            was obtained. Prior Anticoagulants: The patient has                            taken no anticoagulant or antiplatelet agents. ASA                            Grade Assessment: II - A patient with mild systemic                            disease. After reviewing the risks and benefits,                            the patient was deemed in satisfactory condition to  undergo the procedure.                           After obtaining informed consent, the colonoscope                            was passed under direct vision. Throughout the                            procedure, the patient's blood pressure, pulse, and                            oxygen saturations were monitored continuously. The                            Olympus CF-HQ190L  ([id number]) Colonoscope was                            introduced through the anus and advanced to the the                            cecum, identified by appendiceal orifice and                            ileocecal valve. The colonoscopy was performed                            without difficulty. The patient tolerated the                            procedure well. The quality of the bowel                            preparation was good. The ileocecal valve,                            appendiceal orifice, and rectum were photographed.                            The bowel preparation used was Miralax via split                            dose instruction. Scope In: 8:29:09 AM Scope Out: 8:47:36 AM Scope Withdrawal Time: 0 hours 15 minutes 9 seconds  Total Procedure Duration: 0 hours 18 minutes 27 seconds  Findings:                 The digital rectal exam was normal.                           Three sessile and semi-pedunculated polyps were                            found in the descending colon and transverse colon.  The polyps were 3 to 8 mm in size. These polyps                            were removed with a cold snare. Resection and                            retrieval were complete. Verification of patient                            identification for the specimen was done. Estimated                            blood loss was minimal.                           Scattered small-mouthed diverticula were found in                            the sigmoid colon, descending colon and transverse                            colon.                           The exam was otherwise without abnormality on                            direct and retroflexion views. Complications:            No immediate complications. Estimated Blood Loss:     Estimated blood loss was minimal. Impression:               - Three 3 to 8 mm polyps in the descending colon                             and in the transverse colon, removed with a cold                            snare. Resected and retrieved.                           - Diverticulosis in the sigmoid colon, in the                            descending colon and in the transverse colon.                           - The examination was otherwise normal on direct                            and retroflexion views.                           - Personal history of colonic polyp 6 mm adenoma  2016 + FHx CRCA brother at 56. Recommendation:           - Patient has a contact number available for                            emergencies. The signs and symptoms of potential                            delayed complications were discussed with the                            patient. Return to normal activities tomorrow.                            Written discharge instructions were provided to the                            patient.                           - Resume previous diet.                           - Continue present medications.                           - Await pathology results.                           - Repeat colonoscopy is recommended for                            surveillance. The colonoscopy date will be                            determined after pathology results from today's                            exam become available for review. Gatha Mayer, MD 01/06/2022 8:56:50 AM This report has been signed electronically.

## 2022-01-06 NOTE — Progress Notes (Signed)
Called to room to assist during endoscopic procedure.  Patient ID and intended procedure confirmed with present staff. Received instructions for my participation in the procedure from the performing physician.  

## 2022-01-06 NOTE — Progress Notes (Signed)
VS completed by CW.   Pt's states no medical or surgical changes since previsit or office visit.  

## 2022-01-06 NOTE — Progress Notes (Signed)
[facility] History and Physical   Primary Care Physician:  Landry Mellow, MD   Reason for Procedure:   Hx colon adenoma/FHx CRCA  Plan:    colonoscopy     HPI: Tyler Scott is a 66 y.o. male w/ hx 6 mm adenoma removed 2016. Also brother had CRCA atr age 47   Past Medical History:  Diagnosis Date   Allergy    Arthritis    CLL (chronic lymphocytic leukemia) ([place])    stage III: asymptomatic; on observation   DIABETES MELLITUS, TYPE I, UNCONTROLLED 12/30/2006   insulin   Diabetic retinopathy    ERECTILE DYSFUNCTION, ORGANIC 12/30/2006   Hx of adenomatous polyp of colon 05/24/2014   HYPERLIPIDEMIA 12/30/2006   HYPERTENSION 12/30/2006   LOW BACK PAIN, ACUTE 12/21/2006    Past Surgical History:  Procedure Laterality Date   COLONOSCOPY     great right toe- Fused the I P joint      Prior to Admission medications   Medication Sig Start Date End Date Taking? Authorizing Provider  acalabrutinib maleate (CALQUENCE) 100 MG TABS Take 100 mg by mouth 2 (two) times daily. 10/16/21  Yes Wyatt Portela, MD  Cholecalciferol 50 MCG (2000 UT) TABS TAKE ONE TABLET BY MOUTH DAILY FOR LOW VITAMIN D. 04/29/20  Yes [provider]  doxepin (SINEQUAN) 25 MG capsule Take 1 capsule by mouth at bedtime. 11/29/20  Yes [provider]  empagliflozin (JARDIANCE) 25 MG TABS tablet Take 0.5 tablets by mouth every morning. Only if blood sugars get too high 05/30/21  Yes [provider]  finasteride (PROPECIA) 1 MG tablet Take 1 tablet by mouth daily. 10/21/16  Yes [provider]  finasteride (PROSCAR) 5 MG tablet Take 0.5 tablets by mouth daily. 05/01/20  Yes [provider]  Insulin Glargine (LANTUS SOLOSTAR) 100 UNIT/ML Solostar Pen INJECT 20 UNITS SUBCUTANEOUSLY AT BEDTIME 06/30/16  Yes [name], MD  insulin glargine-yfgn (SEMGLEE) 100 UNIT/ML Pen INJECT 22 UNITS SUBCUTANEOUSLY AT BEDTIME (USE WITHIN 28 DAYS AFTER OPENING PEN)  (CONVERTED FROM LANTUS) 11/29/20  Yes [provider]  lisinopril (PRINIVIL,ZESTRIL) 40 MG tablet Take 1 tablet (40 mg total) by mouth daily. 06/30/16  Yes [name], MD  ONE TOUCH ULTRA TEST test strip TEST GLUCOSE 3 TIMES A DAY 10/07/16  Yes [name], MD  [facility] DELICA LANCETS 42P MISC TEST GLUCOSE 3 TIMES A DAY 10/07/16  Yes [name], MD  simvastatin (ZOCOR) 20 MG tablet TAKE 1 TABLET (20 MG TOTAL) BY MOUTH AT BEDTIME. 06/30/16  Yes [name], MD  Blood Glucose Monitoring Suppl (ONE TOUCH ULTRA 2) W/DEVICE KIT See admin instructions. 07/25/14   [provider]  Fluocin-Hydroquinone-Tretinoin (TRI-LUMA) 0.01-4-0.05 % CREA Apply 1 application. topically at bedtime. Patient not taking: Reported on 12/11/2021 04/28/21   Lavonna Monarch, MD  INSULIN GLARGINE, 1 UNIT DIAL, [place] INJECT 20 UNITS SUBCUTANEOUSLY DAILY Patient not taking: Reported on 12/11/2021 10/21/16   [provider]  meloxicam (MOBIC) 7.5 MG tablet TAKE 1 TABLET (7.5 MG TOTAL) BY MOUTH DAILY. Patient taking differently: TAKE 1 TABLET (7.5 MG TOTAL) BY MOUTH DAILY. As needed 10/07/16   [name], MD  sildenafil (REVATIO) 20 MG tablet Take 1 to 2 tabs 2 - 3 hours before sex 06/30/16   [name], MD    Current Outpatient Medications  Medication Sig Dispense Refill   acalabrutinib maleate (CALQUENCE) 100 MG TABS Take 100 mg by mouth 2 (two) times daily.  60 tablet 3   Cholecalciferol 50 MCG (2000 UT) TABS TAKE ONE TABLET BY MOUTH DAILY FOR LOW VITAMIN D.     doxepin (SINEQUAN) 25 MG capsule Take 1 capsule by mouth at bedtime.     empagliflozin (JARDIANCE) 25 MG TABS tablet Take 0.5 tablets by mouth every morning. Only if blood sugars get too high     finasteride (PROPECIA) 1 MG tablet Take 1 tablet by mouth daily.     finasteride (PROSCAR) 5 MG tablet Take 0.5 tablets by mouth daily.     Insulin Glargine (LANTUS SOLOSTAR) 100 UNIT/ML Solostar Pen INJECT 20 UNITS SUBCUTANEOUSLY AT  BEDTIME 3 pen 11   insulin glargine-yfgn (SEMGLEE) 100 UNIT/ML Pen INJECT 22 UNITS SUBCUTANEOUSLY AT BEDTIME (USE WITHIN 28 DAYS AFTER OPENING PEN) (CONVERTED FROM LANTUS)     lisinopril (PRINIVIL,ZESTRIL) 40 MG tablet Take 1 tablet (40 mg total) by mouth daily. 90 tablet 4   ONE TOUCH ULTRA TEST test strip TEST GLUCOSE 3 TIMES A DAY 300 each 1   ONETOUCH DELICA LANCETS 62E MISC TEST GLUCOSE 3 TIMES A DAY 300 each 1   simvastatin (ZOCOR) 20 MG tablet TAKE 1 TABLET (20 MG TOTAL) BY MOUTH AT BEDTIME. 100 tablet 4   Blood Glucose Monitoring Suppl (ONE TOUCH ULTRA 2) W/DEVICE KIT See admin instructions.  0   Fluocin-Hydroquinone-Tretinoin (TRI-LUMA) 0.01-4-0.05 % CREA Apply 1 application. topically at bedtime. (Patient not taking: Reported on 12/11/2021) 30 g 2   INSULIN GLARGINE, 1 UNIT DIAL, [place] INJECT 20 UNITS SUBCUTANEOUSLY DAILY (Patient not taking: Reported on 12/11/2021)     meloxicam (MOBIC) 7.5 MG tablet TAKE 1 TABLET (7.5 MG TOTAL) BY MOUTH DAILY. (Patient taking differently: TAKE 1 TABLET (7.5 MG TOTAL) BY MOUTH DAILY. As needed) 30 tablet 1   sildenafil (REVATIO) 20 MG tablet Take 1 to 2 tabs 2 - 3 hours before sex 30 tablet 11   Current Facility-Administered Medications  Medication Dose Route Frequency Provider Last Rate Last Admin   0.9 %  sodium chloride infusion  500 mL Intravenous Once Gatha Mayer, MD        Allergies as of 01/06/2022   (No Known Allergies)    Family History  Problem Relation Age of Onset   Alcohol abuse Other    Colon cancer Brother 41       Died 09-2013   Diabetes Mellitus II Mother    Esophageal cancer Neg Hx    Rectal cancer Neg Hx    Stomach cancer Neg Hx     Social History   Socioeconomic History   Marital status: Married    Spouse name: Not on file   Number of children: Not on file   Years of education: Not on file   Highest education level: Not on file  Occupational History   Not on file  Tobacco Use   Smoking status: Never   Smokeless  tobacco: Never  Vaping Use   Vaping Use: Never used  Substance and Sexual Activity   Alcohol use: Yes    Alcohol/week: 4.0 standard drinks of alcohol    Types: 4 Glasses of wine per week   Drug use: No   Sexual activity: Not on file    Comment: regular exercise - yes  Other Topics Concern   Not on file  Social History Narrative   Not on file   Social Determinants of Health   Financial Resource Strain: Not on file  Food Insecurity: Not on file  Transportation Needs: Not  on file  Physical Activity: Not on file  Stress: Not on file  Social Connections: Not on file  Intimate Partner Violence: Not on file    Review of Systems:  All other review of systems negative except as mentioned in the HPI.  Physical Exam: Vital signs BP 139/64   Pulse 84   Temp 98 F (36.7 C) (Temporal)   Ht _0  (1.727 m)   Wt 148 lb (67.1 kg)   SpO2 100%   BMI 22.50 kg/m   General:   Alert,  Well-developed, well-nourished, pleasant and cooperative in NAD Lungs:  Clear throughout to auscultation.   Heart:  Regular rate and rhythm; no murmurs, clicks, rubs,  or gallops. Abdomen:  Soft, nontender and nondistended. Normal bowel sounds.   Neuro/Psych:  Alert and cooperative. Normal mood and affect. A and O x 3   _1  E. Carlean Purl, MD, [facility] [phone] (pager) 01/06/2022 8:19 AM@

## 2022-01-06 NOTE — Patient Instructions (Addendum)
I found and removed 3 small polyps. Also saw diverticulosis.   I will let you know pathology results and when to have another routine colonoscopy by mail and/or My Chart.  Please resume medications and typical diet.  I appreciate the opportunity to care for you. Gatha Mayer, MD, [facility]  Information on polyps and diverticulosis given to you today.  Await pathology results.  Resume previous diet and medications.   YOU HAD AN ENDOSCOPIC PROCEDURE TODAY AT [facility]:   Refer to the procedure report that was given to you for any specific questions about what was found during the examination.  If the procedure report does not answer your questions, please call your gastroenterologist to clarify.  If you requested that your care partner not be given the details of your procedure findings, then the procedure report has been included in a sealed envelope for you to review at your convenience later.  YOU SHOULD EXPECT: Some feelings of bloating in the abdomen. Passage of more gas than usual.  Walking can help get rid of the air that was put into your GI tract during the procedure and reduce the bloating. If you had a lower endoscopy (such as a colonoscopy or flexible sigmoidoscopy) you may notice spotting of blood in your stool or on the toilet paper. If you underwent a bowel prep for your procedure, you may not have a normal bowel movement for a few days.  Please Note:  You might notice some irritation and congestion in your nose or some drainage.  This is from the oxygen used during your procedure.  There is no need for concern and it should clear up in a day or so.  SYMPTOMS TO REPORT IMMEDIATELY:  Following lower endoscopy (colonoscopy or flexible sigmoidoscopy):  Excessive amounts of blood in the stool  Significant tenderness or worsening of abdominal pains  Swelling of the abdomen that is new, acute  Fever of 100F or higher    For urgent or emergent issues, a  gastroenterologist can be reached at any hour by calling [phone]. Do not use MyChart messaging for urgent concerns.    DIET:  We do recommend a small meal at first, but then you may proceed to your regular diet.  Drink plenty of fluids but you should avoid alcoholic beverages for 24 hours.  ACTIVITY:  You should plan to take it easy for the rest of today and you should NOT DRIVE or use heavy machinery until tomorrow (because of the sedation medicines used during the test).    FOLLOW UP: Our staff will call the number listed on your records the next business day following your procedure.  We will call around 7:15- 8:00 am to check on you and address any questions or concerns that you may have regarding the information given to you following your procedure. If we do not reach you, we will leave a message.     If any biopsies were taken you will be contacted by phone or by letter within the next 1-3 weeks.  Please call us at [phone] if you have not heard about the biopsies in 3 weeks.    SIGNATURES/CONFIDENTIALITY: You and/or your care partner have signed paperwork which will be entered into your electronic medical record.  These signatures attest to the fact that that the information above on your After Visit Summary has been reviewed and is understood.  Full responsibility of the confidentiality of this discharge information lies with you and/or your  care-partner.

## 2022-01-06 NOTE — Progress Notes (Signed)
Report to PACU, RN, vss, BBS= Clear.  

## 2022-01-07 ENCOUNTER — Telehealth: Payer: Self-pay

## 2022-01-07 NOTE — Telephone Encounter (Signed)
Unable to reach pt.  Left HIPAA compliant voicemail. 

## 2022-01-13 ENCOUNTER — Encounter: Payer: Self-pay | Admitting: Internal Medicine

## 2022-01-13 DIAGNOSIS — Z86010 Personal history of colonic polyps: Secondary | ICD-10-CM

## 2022-01-14 ENCOUNTER — Ambulatory Visit: Payer: No Typology Code available for payment source | Admitting: Oncology

## 2022-01-14 ENCOUNTER — Other Ambulatory Visit: Payer: No Typology Code available for payment source

## 2022-01-26 ENCOUNTER — Telehealth: Payer: Self-pay | Admitting: Oncology

## 2022-01-26 NOTE — Telephone Encounter (Signed)
Called patient regarding January appointment, patient is notified. 

## 2022-02-18 ENCOUNTER — Other Ambulatory Visit: Payer: Self-pay

## 2022-02-18 ENCOUNTER — Inpatient Hospital Stay (HOSPITAL_BASED_OUTPATIENT_CLINIC_OR_DEPARTMENT_OTHER): Payer: No Typology Code available for payment source | Admitting: Oncology

## 2022-02-18 ENCOUNTER — Inpatient Hospital Stay: Payer: No Typology Code available for payment source | Attending: Oncology

## 2022-02-18 VITALS — BP 103/55 | HR 103 | Temp 97.9°F | Resp 18 | Wt 146.4 lb

## 2022-02-18 DIAGNOSIS — Z9221 Personal history of antineoplastic chemotherapy: Secondary | ICD-10-CM | POA: Insufficient documentation

## 2022-02-18 DIAGNOSIS — R590 Localized enlarged lymph nodes: Secondary | ICD-10-CM | POA: Insufficient documentation

## 2022-02-18 DIAGNOSIS — C9110 Chronic lymphocytic leukemia of B-cell type not having achieved remission: Secondary | ICD-10-CM

## 2022-02-18 DIAGNOSIS — Z79899 Other long term (current) drug therapy: Secondary | ICD-10-CM | POA: Insufficient documentation

## 2022-02-18 LAB — CMP (CANCER CENTER ONLY)
ALT: 13 U/L (ref 0–44)
AST: 22 U/L (ref 15–41)
Albumin: 5.1 g/dL — ABNORMAL HIGH (ref 3.5–5.0)
Alkaline Phosphatase: 71 U/L (ref 38–126)
Anion gap: 8 (ref 5–15)
BUN: 17 mg/dL (ref 8–23)
CO2: 28 mmol/L (ref 22–32)
Calcium: 10.1 mg/dL (ref 8.9–10.3)
Chloride: 101 mmol/L (ref 98–111)
Creatinine: 1.28 mg/dL — ABNORMAL HIGH (ref 0.61–1.24)
GFR, Estimated: >60 mL/min (ref >60)
Glucose, Bld: 87 mg/dL (ref 70–99)
Potassium: 4.8 mmol/L (ref 3.5–5.1)
Sodium: 137 mmol/L (ref 135–145)
Total Bilirubin: 1.3 mg/dL — ABNORMAL HIGH (ref 0.3–1.2)
Total Protein: 7.5 g/dL (ref 6.5–8.1)

## 2022-02-18 LAB — CBC WITH DIFFERENTIAL (CANCER CENTER ONLY)
Abs Immature Granulocytes: 0.21 10*3/uL — ABNORMAL HIGH (ref 0.00–0.07)
Basophils Absolute: 0.1 10*3/uL (ref 0.0–0.1)
Basophils Relative: 0 %
Eosinophils Absolute: 0.1 10*3/uL (ref 0.0–0.5)
Eosinophils Relative: 0 %
HCT: 40.8 % (ref 39.0–52.0)
Hemoglobin: 13 g/dL (ref 13.0–17.0)
Immature Granulocytes: 0 %
Lymphocytes Relative: 96 %
Lymphs Abs: 162.9 10*3/uL — ABNORMAL HIGH (ref 0.7–4.0)
MCH: 32.1 pg (ref 26.0–34.0)
MCHC: 31.9 g/dL (ref 30.0–36.0)
MCV: 100.7 fL — ABNORMAL HIGH (ref 80.0–100.0)
Monocytes Absolute: 3.5 10*3/uL — ABNORMAL HIGH (ref 0.1–1.0)
Monocytes Relative: 2 %
Neutro Abs: 4.2 10*3/uL (ref 1.7–7.7)
Neutrophils Relative %: 2 %
Platelet Count: 137 10*3/uL — ABNORMAL LOW (ref 150–400)
RBC: 4.05 MIL/uL — ABNORMAL LOW (ref 4.22–5.81)
RDW: 14.7 % (ref 11.5–15.5)
Smear Review: NORMAL
WBC Count: 171 10*3/uL (ref 4.0–10.5)
nRBC: 0 % (ref 0.0–0.2)

## 2022-02-18 NOTE — Progress Notes (Signed)
CRITICAL VALUE STICKER  CRITICAL VALUE: WBC 171  RECEIVER (on-site recipient of call): Velna Ochs RN  DATE & TIME NOTIFIED: 02/18/22 @ [id number]  MESSENGER (representative from lab): Hillary  MD NOTIFIED: Dr Alen Blew  TIME OF NOTIFICATION: 0930  RESPONSE:  MD aware

## 2022-02-18 NOTE — Progress Notes (Signed)
[facility] OFFICE PROGRESS NOTE 02/18/22  Tyler Mellow, MD [address] [place] [address]  DIAGNOSIS: 67 year old man with CLL diagnosed in 2012 after presenting with lymphocytosis and adenopathy and stage IIIA disease.   Prior therapy:   Bendamustine and rituximab started in July 2017 and received total of 2 cycles. Therapy discontinued in August 2017 based on patient's wishes after achieving partial response.  He developed a relapse disease in April 2019. Ibrutinib 420 mg daily started in April 2019 and discontinued in September 2023 after poor adherence.    CURRENT THERAPY: Calquence 100 mg twice daily started in September 2023.  INTERVAL HISTORY: Tyler Scott returns today for repeat evaluation.  Since the last visit, he reports feeling well without any major complaints.  Continues to tolerate Calquence without any major issues.  He denies any nausea, vomiting or abdominal pain.  He denies any diarrhea or changes in his bowels.  He denies excessive fatigue, tiredness or bruising.  He denies any hematochezia or melena.  His performance status quality of life remains unchanged.     ALLERGIES:  has No Known Allergies.  MEDICATIONS: Reviewed without changes.  Current Outpatient Medications on File Prior to Visit  Medication Sig Dispense Refill   acalabrutinib maleate (CALQUENCE) 100 MG TABS Take 100 mg by mouth 2 (two) times daily. 60 tablet 3   Blood Glucose Monitoring Suppl (ONE TOUCH ULTRA 2) W/DEVICE KIT See admin instructions.  0   Cholecalciferol 50 MCG (2000 UT) TABS TAKE ONE TABLET BY MOUTH DAILY FOR LOW VITAMIN D.     doxepin (SINEQUAN) 25 MG capsule Take 1 capsule by mouth at bedtime.     empagliflozin (JARDIANCE) 25 MG TABS tablet Take 0.5 tablets by mouth every morning. Only if blood sugars get too high     finasteride (PROPECIA) 1 MG tablet Take 1 tablet by mouth daily.     finasteride (PROSCAR) 5 MG tablet Take 0.5  tablets by mouth daily.     Fluocin-Hydroquinone-Tretinoin (TRI-LUMA) 0.01-4-0.05 % CREA Apply 1 application. topically at bedtime. (Patient not taking: Reported on 12/11/2021) 30 g 2   Insulin Glargine (LANTUS SOLOSTAR) 100 UNIT/ML Solostar Pen INJECT 20 UNITS SUBCUTANEOUSLY AT BEDTIME 3 pen 11   INSULIN GLARGINE, 1 UNIT DIAL, [place] INJECT 20 UNITS SUBCUTANEOUSLY DAILY (Patient not taking: Reported on 12/11/2021)     insulin glargine-yfgn (SEMGLEE) 100 UNIT/ML Pen INJECT 22 UNITS SUBCUTANEOUSLY AT BEDTIME (USE WITHIN 28 DAYS AFTER OPENING PEN) (CONVERTED FROM LANTUS)     lisinopril (PRINIVIL,ZESTRIL) 40 MG tablet Take 1 tablet (40 mg total) by mouth daily. 90 tablet 4   meloxicam (MOBIC) 7.5 MG tablet TAKE 1 TABLET (7.5 MG TOTAL) BY MOUTH DAILY. (Patient taking differently: TAKE 1 TABLET (7.5 MG TOTAL) BY MOUTH DAILY. As needed) 30 tablet 1   ONE TOUCH ULTRA TEST test strip TEST GLUCOSE 3 TIMES A DAY 300 each 1   ONETOUCH DELICA LANCETS 42H MISC TEST GLUCOSE 3 TIMES A DAY 300 each 1   sildenafil (REVATIO) 20 MG tablet Take 1 to 2 tabs 2 - 3 hours before sex 30 tablet 11   simvastatin (ZOCOR) 20 MG tablet TAKE 1 TABLET (20 MG TOTAL) BY MOUTH AT BEDTIME. 100 tablet 4   No current facility-administered medications on file prior to visit.      PHYSICAL EXAMINATION:        Blood pressure (!) 103/55, pulse (!) 103, temperature 97.9 F (36.6 C), resp. rate 18, weight 146 lb 6.4 oz (  66.4 kg), SpO2 100 %.       ECOG  0   General appearance: Comfortable appearing without any discomfort Head: Normocephalic without any trauma Oropharynx: Mucous membranes are moist and pink without any thrush or ulcers. Eyes: Pupils are equal and round reactive to light. Lymph nodes: No cervical, supraclavicular, inguinal or axillary lymphadenopathy.   Heart:regular rate and rhythm.  S1 and S2 without leg edema. Lung: Clear without any rhonchi or wheezes.  No dullness to percussion. Abdomin: Soft,  nontender, nondistended with good bowel sounds.  No hepatosplenomegaly. Musculoskeletal: No joint deformity or effusion.  Full range of motion noted. Neurological: No deficits noted on motor, sensory and deep tendon reflex exam. Skin: No petechial rash or dryness.  Appeared moist.                Labs:  Lab Results  Component Value Date   WBC 223.8 (HH) 12/15/2021   HGB 12.2 (L) 12/15/2021   HCT 40.3 12/15/2021   MCV 103.3 (H) 12/15/2021   PLT 146 (L) 12/15/2021   NEUTROABS 4.1 12/15/2021      Chemistry      Component Value Date/Time   NA 139 12/15/2021 0931   NA 142 01/01/2017 0809   K 4.5 12/15/2021 0931   K 4.4 01/01/2017 0809   CL 105 12/15/2021 0931   CL 102 04/25/2012 1003   CO2 26 12/15/2021 0931   CO2 26 01/01/2017 0809   BUN 15 12/15/2021 0931   BUN 15.7 01/01/2017 0809   CREATININE 0.97 12/15/2021 0931   CREATININE 0.9 01/01/2017 0809      Component Value Date/Time   CALCIUM 9.3 12/15/2021 0931   CALCIUM 9.1 01/01/2017 0809   ALKPHOS 76 12/15/2021 0931   ALKPHOS 68 01/01/2017 0809   AST 19 12/15/2021 0931   AST 22 01/01/2017 0809   ALT 12 12/15/2021 0931   ALT 19 01/01/2017 0809   BILITOT 1.1 12/15/2021 0931   BILITOT 0.75 01/01/2017 0809         Assessment and plan:  67 year old man with:    1.  CLL /SLL diagnosed in 2012 after presenting with stage IIIA, leukocytosis and lymphadenopathy.    He is currently on Calquence with continued response in his white cell count continues to drop appropriately.  His hemoglobin and platelet count has also nearly normalized at this time.  Risks and benefits of continuing this treatment were discussed.  Complications that include nausea, fatigue, bleeding as well as cardiac complications were reiterated.  Alternative treatment options would be combining this agent with venetoclax versus systemic chemotherapy combination.  At this time, he is not willing to consider combination therapy due to adherence  issues and will continue with single agent management.  After discussion today he is agreeable to continue and I continue to emphasize the importance of adherence to his medication which has been a problem in the past.   2.  Lymphadenopathy: Related to his lymphoproliferative disorder with cervical adenopathy.  This has nearly resolved at this time.  3.  Tumor lysis syndrome: He is at low risk at this time despite his bulky disease and will continue to monitor his electrolytes for the time being.   4. Follow-up: In 2 months for a follow-up.   30  minutes were spent on this visit.  The time was dedicated to reviewing laboratory data, disease status update and outlining future plan of care discussion.    [name], MD 02/18/22 9:26 AM

## 2022-03-06 ENCOUNTER — Telehealth: Payer: Self-pay | Admitting: Hematology

## 2022-03-06 NOTE — Telephone Encounter (Signed)
Called patient ro r/s to new patient slot. Patient r/s and notified.

## 2022-04-21 ENCOUNTER — Other Ambulatory Visit: Payer: Self-pay

## 2022-04-21 DIAGNOSIS — C9110 Chronic lymphocytic leukemia of B-cell type not having achieved remission: Secondary | ICD-10-CM

## 2022-04-22 ENCOUNTER — Other Ambulatory Visit: Payer: Self-pay

## 2022-04-22 ENCOUNTER — Inpatient Hospital Stay (HOSPITAL_BASED_OUTPATIENT_CLINIC_OR_DEPARTMENT_OTHER): Payer: No Typology Code available for payment source | Admitting: Hematology

## 2022-04-22 ENCOUNTER — Ambulatory Visit: Payer: No Typology Code available for payment source | Admitting: Hematology

## 2022-04-22 ENCOUNTER — Inpatient Hospital Stay: Payer: No Typology Code available for payment source | Attending: Oncology

## 2022-04-22 ENCOUNTER — Other Ambulatory Visit: Payer: No Typology Code available for payment source

## 2022-04-22 VITALS — BP 130/77 | HR 94 | Temp 97.9°F | Resp 20 | Wt 143.2 lb

## 2022-04-22 DIAGNOSIS — R590 Localized enlarged lymph nodes: Secondary | ICD-10-CM | POA: Insufficient documentation

## 2022-04-22 DIAGNOSIS — D696 Thrombocytopenia, unspecified: Secondary | ICD-10-CM

## 2022-04-22 DIAGNOSIS — C9110 Chronic lymphocytic leukemia of B-cell type not having achieved remission: Secondary | ICD-10-CM | POA: Insufficient documentation

## 2022-04-22 DIAGNOSIS — Z79899 Other long term (current) drug therapy: Secondary | ICD-10-CM | POA: Insufficient documentation

## 2022-04-22 LAB — CBC WITH DIFFERENTIAL (CANCER CENTER ONLY)
Abs Immature Granulocytes: 0.19 10*3/uL — ABNORMAL HIGH (ref 0.00–0.07)
Basophils Absolute: 0.2 10*3/uL — ABNORMAL HIGH (ref 0.0–0.1)
Basophils Relative: 0 %
Eosinophils Absolute: 0.1 10*3/uL (ref 0.0–0.5)
Eosinophils Relative: 0 %
HCT: 43.9 % (ref 39.0–52.0)
Hemoglobin: 13.7 g/dL (ref 13.0–17.0)
Immature Granulocytes: 0 %
Lymphocytes Relative: 92 %
Lymphs Abs: 105.8 10*3/uL — ABNORMAL HIGH (ref 0.7–4.0)
MCH: 31.5 pg (ref 26.0–34.0)
MCHC: 31.2 g/dL (ref 30.0–36.0)
MCV: 100.9 fL — ABNORMAL HIGH (ref 80.0–100.0)
Monocytes Absolute: 3 10*3/uL — ABNORMAL HIGH (ref 0.1–1.0)
Monocytes Relative: 3 %
Neutro Abs: 5.2 10*3/uL (ref 1.7–7.7)
Neutrophils Relative %: 5 %
Platelet Count: 145 10*3/uL — ABNORMAL LOW (ref 150–400)
RBC: 4.35 MIL/uL (ref 4.22–5.81)
RDW: 17.3 % — ABNORMAL HIGH (ref 11.5–15.5)
Smear Review: NORMAL
WBC Count: 114.5 10*3/uL (ref 4.0–10.5)
nRBC: 0 % (ref 0.0–0.2)

## 2022-04-22 LAB — CMP (CANCER CENTER ONLY)
ALT: 17 U/L (ref 0–44)
AST: 24 U/L (ref 15–41)
Albumin: 4.6 g/dL (ref 3.5–5.0)
Alkaline Phosphatase: 65 U/L (ref 38–126)
Anion gap: 7 (ref 5–15)
BUN: 21 mg/dL (ref 8–23)
CO2: 27 mmol/L (ref 22–32)
Calcium: 8.9 mg/dL (ref 8.9–10.3)
Chloride: 108 mmol/L (ref 98–111)
Creatinine: 0.79 mg/dL (ref 0.61–1.24)
GFR, Estimated: >60 mL/min (ref >60)
Glucose, Bld: 139 mg/dL — ABNORMAL HIGH (ref 70–99)
Potassium: 4.4 mmol/L (ref 3.5–5.1)
Sodium: 142 mmol/L (ref 135–145)
Total Bilirubin: 1.7 mg/dL — ABNORMAL HIGH (ref 0.3–1.2)
Total Protein: 7.1 g/dL (ref 6.5–8.1)

## 2022-04-22 MED ORDER — CALQUENCE 100 MG PO TABS: 100.0000 mg | ORAL_TABLET | Freq: Two times a day (BID) | ORAL | 3 refills | Status: DC

## 2022-04-22 NOTE — Progress Notes (Signed)
HEMATOLOGY/ONCOLOGY CONSULTATION NOTE  Date of Service: 04/22/2022  Patient Care Team: Landry Mellow, MD as PCP - General (Family Medicine) Rozetta Nunnery, MD (Inactive)  CHIEF COMPLAINTS/PURPOSE OF CONSULTATION:  Evaluation and management of CLL  DIAGNOSIS: 67 year old man with CLL diagnosed in 2012 after presenting with lymphocytosis and adenopathy and stage IIIA disease.   Prior therapy:    Bendamustine and rituximab started in July 2017 and received total of 2 cycles. Therapy discontinued in August 2017 based on patient's wishes after achieving partial response.   He developed a relapse disease in April 2019. Ibrutinib 420 mg daily started in April 2019 and discontinued in September 2023 after poor adherence.   CURRENT THERAPY: Calquence 100 mg twice daily started in September 2023.  HISTORY OF PRESENTING ILLNESS:  Tyler Scott is a wonderful 67 y.o. male who has been a previous patient of Dr. Alen Blew. He is here for evaluation and management of CLL.   Patient was last seen by Dr. Alen Blew on 02/18/2022 and was doing well overall with no new medical concerns.  Today, he reports that he has been on and off different treatment since 2017, but has been on Calquence since September, 2023. He does admit that he does forget to take this medication a couple times a week. He reports that his previous treatment of Ibrutinib did cause him diarrhea.  He did receive two previous injection in his buttocks, but is unsure it is was steroids or antibiotics. Patient is UTD with influenza, RSV, and COVID-19 vaccinations. He denies any abdominal pain, but does note some discomfort along the spleen. He denies any bleeding issues, chest pain, diarrhea, resolved lumps/bumps in neck, infection issues, fevers, or chills.  He does note some SOB related to allergies and chest/sinus congestion. His symptoms are improving. He also reports that his neck edema had improved. He does complain  of sleep deprivation/poor sleep habits and notes some stress. He does typically sleep during the day.  He does take vitamin D and C supplements regularly. On average, he does drink 1 glass of wine a day and denies any major medication changes.  MEDICAL HISTORY:  Past Medical History:  Diagnosis Date   Allergy    Arthritis    CLL (chronic lymphocytic leukemia) ([place])    stage III: asymptomatic; on observation   DIABETES MELLITUS, TYPE I, UNCONTROLLED 12/30/2006   insulin   Diabetic retinopathy    ERECTILE DYSFUNCTION, ORGANIC 12/30/2006   Hx of adenomatous polyp of colon 05/24/2014   HYPERLIPIDEMIA 12/30/2006   HYPERTENSION 12/30/2006   LOW BACK PAIN, ACUTE 12/21/2006    SURGICAL HISTORY: Past Surgical History:  Procedure Laterality Date   COLONOSCOPY     great right toe- Fused the I P joint      SOCIAL HISTORY: Social History   Socioeconomic History   Marital status: Married    Spouse name: Not on file   Number of children: Not on file   Years of education: Not on file   Highest education level: Not on file  Occupational History   Not on file  Tobacco Use   Smoking status: Never   Smokeless tobacco: Never  Vaping Use   Vaping Use: Never used  Substance and Sexual Activity   Alcohol use: Yes    Alcohol/week: 4.0 standard drinks of alcohol    Types: 4 Glasses of wine per week   Drug use: No   Sexual activity: Not on file    Comment: regular exercise - yes  Other Topics Concern   Not on file  Social History Narrative   Not on file   Social Determinants of Health   Financial Resource Strain: Not on file  Food Insecurity: Not on file  Transportation Needs: Not on file  Physical Activity: Not on file  Stress: Not on file  Social Connections: Not on file  Intimate Partner Violence: Not on file    FAMILY HISTORY: Family History  Problem Relation Age of Onset   Alcohol abuse Other    Colon cancer Brother 55       Died 09-2013   Diabetes Mellitus II Mother     Esophageal cancer Neg Hx    Rectal cancer Neg Hx    Stomach cancer Neg Hx     ALLERGIES:  has No Known Allergies.  MEDICATIONS:  Current Outpatient Medications  Medication Sig Dispense Refill   acalabrutinib maleate (CALQUENCE) 100 MG TABS Take 100 mg by mouth 2 (two) times daily. 60 tablet 3   Blood Glucose Monitoring Suppl (ONE TOUCH ULTRA 2) W/DEVICE KIT See admin instructions.  0   Cholecalciferol 50 MCG (2000 UT) TABS TAKE ONE TABLET BY MOUTH DAILY FOR LOW VITAMIN D.     doxepin (SINEQUAN) 25 MG capsule Take 1 capsule by mouth at bedtime.     empagliflozin (JARDIANCE) 25 MG TABS tablet Take 0.5 tablets by mouth every morning. Only if blood sugars get too high     finasteride (PROPECIA) 1 MG tablet Take 1 tablet by mouth daily.     finasteride (PROSCAR) 5 MG tablet Take 0.5 tablets by mouth daily.     Fluocin-Hydroquinone-Tretinoin (TRI-LUMA) 0.01-4-0.05 % CREA Apply 1 application. topically at bedtime. (Patient not taking: Reported on 12/11/2021) 30 g 2   Insulin Glargine (LANTUS SOLOSTAR) 100 UNIT/ML Solostar Pen INJECT 20 UNITS SUBCUTANEOUSLY AT BEDTIME 3 pen 11   INSULIN GLARGINE, 1 UNIT DIAL, [place] INJECT 20 UNITS SUBCUTANEOUSLY DAILY (Patient not taking: Reported on 12/11/2021)     insulin glargine-yfgn (SEMGLEE) 100 UNIT/ML Pen INJECT 22 UNITS SUBCUTANEOUSLY AT BEDTIME (USE WITHIN 28 DAYS AFTER OPENING PEN) (CONVERTED FROM LANTUS)     lisinopril (PRINIVIL,ZESTRIL) 40 MG tablet Take 1 tablet (40 mg total) by mouth daily. 90 tablet 4   meloxicam (MOBIC) 7.5 MG tablet TAKE 1 TABLET (7.5 MG TOTAL) BY MOUTH DAILY. (Patient taking differently: TAKE 1 TABLET (7.5 MG TOTAL) BY MOUTH DAILY. As needed) 30 tablet 1   ONE TOUCH ULTRA TEST test strip TEST GLUCOSE 3 TIMES A DAY 300 each 1   ONETOUCH DELICA LANCETS 81O MISC TEST GLUCOSE 3 TIMES A DAY 300 each 1   sildenafil (REVATIO) 20 MG tablet Take 1 to 2 tabs 2 - 3 hours before sex 30 tablet 11   simvastatin (ZOCOR) 20 MG tablet TAKE 1 TABLET  (20 MG TOTAL) BY MOUTH AT BEDTIME. 100 tablet 4   No current facility-administered medications for this visit.    REVIEW OF SYSTEMS:    10 Point review of Systems was done is negative except as noted above.  PHYSICAL EXAMINATION: ECOG PERFORMANCE STATUS: 1 - Symptomatic but completely ambulatory  . Vitals:   04/22/22 1425  BP: 130/77  Pulse: 94  Resp: 20  Temp: 97.9 F (36.6 C)  SpO2: 100%   Filed Weights   04/22/22 1425  Weight: 143 lb 3.2 oz (65 kg)   .Body mass index is 21.77 kg/m.  GENERAL:alert, in no acute distress and comfortable SKIN: no acute rashes, no significant lesions EYES: conjunctiva  are pink and non-injected, sclera anicteric OROPHARYNX: MMM, no exudates, no oropharyngeal erythema or ulceration NECK: supple, no JVD LYMPH:  no palpable lymphadenopathy in the cervical, axillary or inguinal regions LUNGS: clear to auscultation b/l with normal respiratory effort HEART: regular rate & rhythm ABDOMEN:  normoactive bowel sounds , non tender, not distended. Extremity: no pedal edema PSYCH: alert & oriented x 3 with fluent speech NEURO: no focal motor/sensory deficits  LABORATORY DATA:  I have reviewed the data as listed .    Latest Ref Rng & Units 04/22/2022    2:08 PM 02/18/2022    9:09 AM 12/15/2021    9:31 AM  CBC  WBC 4.0 - 10.5 K/uL 114.5  171.0  223.8   Hemoglobin 13.0 - 17.0 g/dL 60.4  54.0  98.1   Hematocrit 39.0 - 52.0 % 43.9  40.8  40.3   Platelets 150 - 400 K/uL 145  137  146    .CMP     Component Value Date/Time   NA 142 04/22/2022 1408   NA 142 01/01/2017 0809   K 4.4 04/22/2022 1408   K 4.4 01/01/2017 0809   CL 108 04/22/2022 1408   CL 102 04/25/2012 1003   CO2 27 04/22/2022 1408   CO2 26 01/01/2017 0809   GLUCOSE 139 (H) 04/22/2022 1408   GLUCOSE 89 01/01/2017 0809   GLUCOSE 170 (H) 04/25/2012 1003   BUN 21 04/22/2022 1408   BUN 15.7 01/01/2017 0809   CREATININE 0.79 04/22/2022 1408   CREATININE 0.9 01/01/2017 0809    CALCIUM 8.9 04/22/2022 1408   CALCIUM 9.1 01/01/2017 0809   PROT 7.1 04/22/2022 1408   PROT 7.0 01/01/2017 0809   ALBUMIN 4.6 04/22/2022 1408   ALBUMIN 4.2 01/01/2017 0809   AST 24 04/22/2022 1408   AST 22 01/01/2017 0809   ALT 17 04/22/2022 1408   ALT 19 01/01/2017 0809   ALKPHOS 65 04/22/2022 1408   ALKPHOS 68 01/01/2017 0809   BILITOT 1.7 (H) 04/22/2022 1408   BILITOT 0.75 01/01/2017 0809   GFRNONAA >60 04/22/2022 1408   GFRAA >60 07/25/2019 0932   .    Latest Ref Rng & Units 04/22/2022    2:08 PM 02/18/2022    9:09 AM 12/15/2021    9:31 AM  CMP  Glucose 70 - 99 mg/dL 191  87  478   BUN 8 - 23 mg/dL 21  17  15    Creatinine 0.61 - 1.24 mg/dL  2.95  6.21   Sodium 135 - 145 mmol/L 142  137  139   Potassium 3.5 - 5.1 mmol/L 4.4  4.8  4.5   Chloride 98 - 111 mmol/L 108  101  105   CO2 22 - 32 mmol/L 27  28  26    Calcium 8.9 - 10.3 mg/dL 8.9  3.08  9.3   Total Protein 6.5 - 8.1 g/dL 7.1  7.5  7.0   Total Bilirubin 0.3 - 1.2 mg/dL 1.7  1.3  1.1   Alkaline Phos 38 - 126 U/L 65  71  76   AST 15 - 41 U/L 24  22  19    ALT 0 - 44 U/L 17  13  12      RADIOGRAPHIC STUDIES: I have personally reviewed the radiological images as listed and agreed with the findings in the report. No results found.  ASSESSMENT & PLAN:   67 year old man with:      1.  CLL /SLL diagnosed in 2012 after presenting with  stage IIIA, leukocytosis and lymphadenopathy.  He is currently on Calquence with continued response in his white cell count continues to drop appropriately. His hemoglobin and platelet count has also nearly normalized at this time.   2.  Lymphadenopathy: Related to his lymphoproliferative disorder with cervical adenopathy.  This has nearly resolved at this time.   3.  Tumor lysis syndrome: He is at low risk at this time despite his bulky disease and will continue to monitor his electrolytes for the time being.  PLAN: -patient transferred care from Dr Alen Blew .Oncologic history was  reviewed in details and confirmed with the patient, -Discussed lab results on 04/22/2022 with patient. CBC showed WBC improved to 114.5K, hemoglobin improved to 13.7, and platelets of 145K. -informed patient that WBC levels would typically be expected to be better unless patient is not compliantly taking medicine -Patient has intermittently been on Calquence since September, 2023 -CT scan of neck 07/01/2021 revealed small lymph nodes in the neck, which have since resolved -advised patient to compliantly take Calquence in order to gain optimal benefits of medication and to avoid any resistance to the medication -Recommended patient to set an alarm oon his phone twice a day to ensure he compliantly takes his medication with no missed dose to better control cancer for a longer period -Recommend patient to take OTC vitamin B complex 1 capsule daily to improve bone marrow function -continue to monitor with labs in 3 months  FOLLOW-UP: RTC with Dr Irene Limbo with labs in 3 months  The total time spent in the appointment was 42 minutes* .  All of the patient's questions were answered with apparent satisfaction. The patient knows to call the clinic with any problems, questions or concerns.   Sullivan Lone MD MS AAHIVMS [facility] Hematology/Oncology Physician [facility]  .*Total Encounter Time as defined by the Centers for Medicare and Medicaid Services includes, in addition to the face-to-face time of a patient visit (documented in the note above) non-face-to-face time: obtaining and reviewing outside history, ordering and reviewing medications, tests or procedures, care coordination (communications with other health care professionals or caregivers) and documentation in the medical record.   I,Mitra Faeizi,acting as a Education administrator for Sullivan Lone, MD.,have documented all relevant documentation on the behalf of Sullivan Lone, MD,as directed by  Sullivan Lone, MD while in the presence of Sullivan Lone,  MD.  .I have reviewed the above documentation for accuracy and completeness, and I agree with the above. Brunetta Genera MD

## 2022-07-21 ENCOUNTER — Other Ambulatory Visit: Payer: Self-pay

## 2022-07-21 DIAGNOSIS — C9110 Chronic lymphocytic leukemia of B-cell type not having achieved remission: Secondary | ICD-10-CM

## 2022-07-22 ENCOUNTER — Inpatient Hospital Stay: Payer: No Typology Code available for payment source | Attending: Oncology

## 2022-07-22 ENCOUNTER — Inpatient Hospital Stay: Payer: No Typology Code available for payment source | Admitting: Hematology

## 2022-07-22 NOTE — Progress Notes (Incomplete)
HEMATOLOGY/ONCOLOGY CONSULTATION NOTE  Date of Service: 07/22/2022  Patient Care Team: Anson Fret, MD as PCP - General (Family Medicine) Drema Halon, MD (Inactive)  CHIEF COMPLAINTS/PURPOSE OF CONSULTATION:  Evaluation and management of CLL  DIAGNOSIS: 67 year old man with CLL diagnosed in 2012 after presenting with lymphocytosis and adenopathy and stage IIIA disease.   Prior therapy:    Bendamustine and rituximab started in July 2017 and received total of 2 cycles. Therapy discontinued in August 2017 based on patient's wishes after achieving partial response.   He developed a relapse disease in April 2019. Ibrutinib 420 mg daily started in April 2019 and discontinued in September 2023 after poor adherence.   CURRENT THERAPY: Calquence 100 mg twice daily started in September 2023.  HISTORY OF PRESENTING ILLNESS:  Tyler Scott is a wonderful 67 y.o. male who has been a previous patient of Dr. Clelia Croft. He is here for evaluation and management of CLL.   Patient was last seen by Dr. Clelia Croft on 02/18/2022 and was doing well overall with no new medical concerns.  Today, he reports that he has been on and off different treatment since 2017, but has been on Calquence since September, 2023. He does admit that he does forget to take this medication a couple times a week. He reports that his previous treatment of Ibrutinib did cause him diarrhea.  He did receive two previous injection in his buttocks, but is unsure it is was steroids or antibiotics. Patient is UTD with influenza, RSV, and COVID-19 vaccinations. He denies any abdominal pain, but does note some discomfort along the spleen. He denies any bleeding issues, chest pain, diarrhea, resolved lumps/bumps in neck, infection issues, fevers, or chills.  He does note some SOB related to allergies and chest/sinus congestion. His symptoms are improving. He also reports that his neck edema had improved. He does complain  of sleep deprivation/poor sleep habits and notes some stress. He does typically sleep during the day.  He does take vitamin D and C supplements regularly. On average, he does drink 1 glass of wine a day and denies any major medication changes.  INTERVAL HISTORY: Tyler Scott is a wonderful 67 y.o. male. He is here for continued evaluation and management of CLL. Patient was last seen by me on 04/22/2022 and complained of discomfort along the spleen, SOB related to allergies, chest/sinus congestion, sleep deprivation, poor sleep habits, and stress.  Today,  -Discussed lab results on 07/22/2022 in detail with patient. CBC showed WBC of ***K, hemoglobin of ***, and platelets of ***K. -  MEDICAL HISTORY:  Past Medical History:  Diagnosis Date   Allergy    Arthritis    CLL (chronic lymphocytic leukemia) (HCC)    stage III: asymptomatic; on observation   DIABETES MELLITUS, TYPE I, UNCONTROLLED 12/30/2006   insulin   Diabetic retinopathy    ERECTILE DYSFUNCTION, ORGANIC 12/30/2006   Hx of adenomatous polyp of colon 05/24/2014   HYPERLIPIDEMIA 12/30/2006   HYPERTENSION 12/30/2006   LOW BACK PAIN, ACUTE 12/21/2006    SURGICAL HISTORY: Past Surgical History:  Procedure Laterality Date   COLONOSCOPY     great right toe- Fused the I P joint      SOCIAL HISTORY: Social History   Socioeconomic History   Marital status: Married    Spouse name: Not on file   Number of children: Not on file   Years of education: Not on file   Highest education level: Not on file  Occupational History  Not on file  Tobacco Use   Smoking status: Never   Smokeless tobacco: Never  Vaping Use   Vaping Use: Never used  Substance and Sexual Activity   Alcohol use: Yes    Alcohol/week: 4.0 standard drinks of alcohol    Types: 4 Glasses of wine per week   Drug use: No   Sexual activity: Not on file    Comment: regular exercise - yes  Other Topics Concern   Not on file  Social History Narrative    Not on file   Social Determinants of Health   Financial Resource Strain: Not on file  Food Insecurity: Not on file  Transportation Needs: Not on file  Physical Activity: Not on file  Stress: Not on file  Social Connections: Not on file  Intimate Partner Violence: Not on file    FAMILY HISTORY: Family History  Problem Relation Age of Onset   Alcohol abuse Other    Colon cancer Brother 70       Died 10-31-2013   Diabetes Mellitus II Mother    Esophageal cancer Neg Hx    Rectal cancer Neg Hx    Stomach cancer Neg Hx     ALLERGIES:  has No Known Allergies.  MEDICATIONS:  Current Outpatient Medications  Medication Sig Dispense Refill   acalabrutinib maleate (CALQUENCE) 100 MG tablet Take 1 tablet (100 mg total) by mouth 2 (two) times daily. 60 tablet 3   Blood Glucose Monitoring Suppl (ONE TOUCH ULTRA 2) W/DEVICE KIT See admin instructions.  0   Cholecalciferol 50 MCG (2000 UT) TABS TAKE ONE TABLET BY MOUTH DAILY FOR LOW VITAMIN D.     doxepin (SINEQUAN) 25 MG capsule Take 1 capsule by mouth at bedtime.     empagliflozin (JARDIANCE) 25 MG TABS tablet Take 0.5 tablets by mouth every morning. Only if blood sugars get too high     finasteride (PROPECIA) 1 MG tablet Take 1 tablet by mouth daily.     finasteride (PROSCAR) 5 MG tablet Take 0.5 tablets by mouth daily.     Fluocin-Hydroquinone-Tretinoin (TRI-LUMA) 0.01-4-0.05 % CREA Apply 1 application. topically at bedtime. (Patient not taking: Reported on 12/11/2021) 30 g 2   Insulin Glargine (LANTUS SOLOSTAR) 100 UNIT/ML Solostar Pen INJECT 20 UNITS SUBCUTANEOUSLY AT BEDTIME 3 pen 11   INSULIN GLARGINE, 1 UNIT DIAL, [place] INJECT 20 UNITS SUBCUTANEOUSLY DAILY (Patient not taking: Reported on 12/11/2021)     insulin glargine-yfgn (SEMGLEE) 100 UNIT/ML Pen INJECT 22 UNITS SUBCUTANEOUSLY AT BEDTIME (USE WITHIN 28 DAYS AFTER OPENING PEN) (CONVERTED FROM LANTUS)     lisinopril (PRINIVIL,ZESTRIL) 40 MG tablet Take 1 tablet (40 mg total) by mouth  daily. 90 tablet 4   meloxicam (MOBIC) 7.5 MG tablet TAKE 1 TABLET (7.5 MG TOTAL) BY MOUTH DAILY. (Patient taking differently: TAKE 1 TABLET (7.5 MG TOTAL) BY MOUTH DAILY. As needed) 30 tablet 1   ONE TOUCH ULTRA TEST test strip TEST GLUCOSE 3 TIMES A DAY 300 each 1   ONETOUCH DELICA LANCETS 33G MISC TEST GLUCOSE 3 TIMES A DAY 300 each 1   sildenafil (REVATIO) 20 MG tablet Take 1 to 2 tabs 2 - 3 hours before sex 30 tablet 11   simvastatin (ZOCOR) 20 MG tablet TAKE 1 TABLET (20 MG TOTAL) BY MOUTH AT BEDTIME. 100 tablet 4   No current facility-administered medications for this visit.    REVIEW OF SYSTEMS:    10 Point review of Systems was done is negative except as noted above.  PHYSICAL EXAMINATION: ECOG PERFORMANCE STATUS: 1 - Symptomatic but completely ambulatory  . There were no vitals filed for this visit.  There were no vitals filed for this visit.  .There is no height or weight on file to calculate BMI.   GENERAL:alert, in no acute distress and comfortable SKIN: no acute rashes, no significant lesions EYES: conjunctiva are pink and non-injected, sclera anicteric OROPHARYNX: MMM, no exudates, no oropharyngeal erythema or ulceration NECK: supple, no JVD LYMPH:  no palpable lymphadenopathy in the cervical, axillary or inguinal regions LUNGS: clear to auscultation b/l with normal respiratory effort HEART: regular rate & rhythm ABDOMEN:  normoactive bowel sounds , non tender, not distended. Extremity: no pedal edema PSYCH: alert & oriented x 3 with fluent speech NEURO: no focal motor/sensory deficits  LABORATORY DATA:  I have reviewed the data as listed .    Latest Ref Rng & Units 04/22/2022    2:08 PM 02/18/2022    9:09 AM 12/15/2021    9:31 AM  CBC  WBC 4.0 - 10.5 K/uL 114.5  171.0  223.8   Hemoglobin 13.0 - 17.0 g/dL 16.1  09.6  04.5   Hematocrit 39.0 - 52.0 % 43.9  40.8  40.3   Platelets 150 - 400 K/uL 145  137  146    .CMP     Component Value Date/Time   NA  142 04/22/2022 1408   NA 142 01/01/2017 0809   K 4.4 04/22/2022 1408   K 4.4 01/01/2017 0809   CL 108 04/22/2022 1408   CL 102 04/25/2012 1003   CO2 27 04/22/2022 1408   CO2 26 01/01/2017 0809   GLUCOSE 139 (H) 04/22/2022 1408   GLUCOSE 89 01/01/2017 0809   GLUCOSE 170 (H) 04/25/2012 1003   BUN 21 04/22/2022 1408   BUN 15.7 01/01/2017 0809   CREATININE 0.79 04/22/2022 1408   CREATININE 0.9 01/01/2017 0809   CALCIUM 8.9 04/22/2022 1408   CALCIUM 9.1 01/01/2017 0809   PROT 7.1 04/22/2022 1408   PROT 7.0 01/01/2017 0809   ALBUMIN 4.6 04/22/2022 1408   ALBUMIN 4.2 01/01/2017 0809   AST 24 04/22/2022 1408   AST 22 01/01/2017 0809   ALT 17 04/22/2022 1408   ALT 19 01/01/2017 0809   ALKPHOS 65 04/22/2022 1408   ALKPHOS 68 01/01/2017 0809   BILITOT 1.7 (H) 04/22/2022 1408   BILITOT 0.75 01/01/2017 0809   GFRNONAA >60 04/22/2022 1408   GFRAA >60 07/25/2019 0932   .    Latest Ref Rng & Units 04/22/2022    2:08 PM 02/18/2022    9:09 AM 12/15/2021    9:31 AM  CMP  Glucose 70 - 99 mg/dL 409  87  811   BUN 8 - 23 mg/dL 21  17  15    Creatinine 0.61 - 1.24 mg/dL 9.14  7.82  9.56   Sodium 135 - 145 mmol/L 142  137  139   Potassium 3.5 - 5.1 mmol/L 4.4  4.8  4.5   Chloride 98 - 111 mmol/L 108  101  105   CO2 22 - 32 mmol/L 27  28  26    Calcium 8.9 - 10.3 mg/dL 8.9  21.3  9.3   Total Protein 6.5 - 8.1 g/dL 7.1  7.5  7.0   Total Bilirubin 0.3 - 1.2 mg/dL 1.7  1.3  1.1   Alkaline Phos 38 - 126 U/L 65  71  76   AST 15 - 41 U/L 24  22  19  ALT 0 - 44 U/L 17  13  12      RADIOGRAPHIC STUDIES: I have personally reviewed the radiological images as listed and agreed with the findings in the report. No results found.  ASSESSMENT & PLAN:   67 y.o. man with:      1.  CLL /SLL diagnosed in 2012 after presenting with stage IIIA, leukocytosis and lymphadenopathy.  He is currently on Calquence with continued response in his white cell count continues to drop appropriately. His hemoglobin  and platelet count has also nearly normalized at this time.   2.  Lymphadenopathy: Related to his lymphoproliferative disorder with cervical adenopathy.  This has nearly resolved at this time.   3.  Tumor lysis syndrome: He is at low risk at this time despite his bulky disease and will continue to monitor his electrolytes for the time being.  PLAN: -patient transferred care from Dr Clelia Croft .Oncologic history was reviewed in details and confirmed with the patient, -Discussed lab results on 04/22/2022 with patient. CBC showed WBC improved to 114.5K, hemoglobin improved to 13.7, and platelets of 145K. -informed patient that WBC levels would typically be expected to be better unless patient is not compliantly taking medicine -Patient has intermittently been on Calquence since September, 2023 -CT scan of neck 07/01/2021 revealed small lymph nodes in the neck, which have since resolved -advised patient to compliantly take Calquence in order to gain optimal benefits of medication and to avoid any resistance to the medication -Recommended patient to set an alarm oon his phone twice a day to ensure he compliantly takes his medication with no missed dose to better control cancer for a longer period -Recommend patient to take OTC vitamin B complex 1 capsule daily to improve bone marrow function -continue to monitor with labs in 3 months  FOLLOW-UP: ***  The total time spent in the appointment was *** minutes* .  All of the patient's questions were answered with apparent satisfaction. The patient knows to call the clinic with any problems, questions or concerns.   Wyvonnia Lora MD MS AAHIVMS [facility] Hematology/Oncology Physician [facility]  .*Total Encounter Time as defined by the Centers for Medicare and Medicaid Services includes, in addition to the face-to-face time of a patient visit (documented in the note above) non-face-to-face time: obtaining and reviewing outside history, ordering  and reviewing medications, tests or procedures, care coordination (communications with other health care professionals or caregivers) and documentation in the medical record.   I,Mitra Faeizi,acting as a Neurosurgeon for Wyvonnia Lora, MD.,have documented all relevant documentation on the behalf of Wyvonnia Lora, MD,as directed by  Wyvonnia Lora, MD while in the presence of Wyvonnia Lora, MD.  ***

## 2022-08-20 ENCOUNTER — Telehealth: Payer: Self-pay | Admitting: Hematology

## 2022-08-24 ENCOUNTER — Inpatient Hospital Stay: Payer: No Typology Code available for payment source | Attending: Oncology

## 2022-08-24 ENCOUNTER — Inpatient Hospital Stay: Payer: No Typology Code available for payment source | Admitting: Hematology

## 2022-10-06 ENCOUNTER — Inpatient Hospital Stay (HOSPITAL_BASED_OUTPATIENT_CLINIC_OR_DEPARTMENT_OTHER): Payer: No Typology Code available for payment source | Admitting: Hematology

## 2022-10-06 ENCOUNTER — Inpatient Hospital Stay: Payer: No Typology Code available for payment source | Attending: Oncology

## 2022-10-06 VITALS — BP 121/69 | HR 87 | Temp 98.1°F | Resp 20 | Wt 139.4 lb

## 2022-10-06 DIAGNOSIS — C9110 Chronic lymphocytic leukemia of B-cell type not having achieved remission: Secondary | ICD-10-CM | POA: Insufficient documentation

## 2022-10-06 LAB — CBC WITH DIFFERENTIAL (CANCER CENTER ONLY)
Abs Immature Granulocytes: 0.16 10*3/uL — ABNORMAL HIGH (ref 0.00–0.07)
Basophils Absolute: 0 10*3/uL (ref 0.0–0.1)
Basophils Relative: 0 %
Eosinophils Absolute: 0 10*3/uL (ref 0.0–0.5)
Eosinophils Relative: 0 %
HCT: 50.2 % (ref 39.0–52.0)
Hemoglobin: 16.4 g/dL (ref 13.0–17.0)
Immature Granulocytes: 0 %
Lymphocytes Relative: 91 %
Lymphs Abs: 65.5 10*3/uL — ABNORMAL HIGH (ref 0.7–4.0)
MCH: 32.3 pg (ref 26.0–34.0)
MCHC: 32.7 g/dL (ref 30.0–36.0)
MCV: 98.8 fL (ref 80.0–100.0)
Monocytes Absolute: 1.6 10*3/uL — ABNORMAL HIGH (ref 0.1–1.0)
Monocytes Relative: 2 %
Neutro Abs: 4.9 10*3/uL (ref 1.7–7.7)
Neutrophils Relative %: 7 %
Platelet Count: 154 10*3/uL (ref 150–400)
RBC: 5.08 MIL/uL (ref 4.22–5.81)
RDW: 15.9 % — ABNORMAL HIGH (ref 11.5–15.5)
WBC Count: 72.3 10*3/uL (ref 4.0–10.5)
nRBC: 0 % (ref 0.0–0.2)

## 2022-10-06 LAB — CMP (CANCER CENTER ONLY)
ALT: 10 U/L (ref 0–44)
AST: 16 U/L (ref 15–41)
Albumin: 4.6 g/dL (ref 3.5–5.0)
Alkaline Phosphatase: 71 U/L (ref 38–126)
Anion gap: 6 (ref 5–15)
BUN: 23 mg/dL (ref 8–23)
CO2: 27 mmol/L (ref 22–32)
Calcium: 9.4 mg/dL (ref 8.9–10.3)
Chloride: 107 mmol/L (ref 98–111)
Creatinine: 1.1 mg/dL (ref 0.61–1.24)
GFR, Estimated: >60 mL/min (ref >60)
Glucose, Bld: 123 mg/dL — ABNORMAL HIGH (ref 70–99)
Potassium: 4.7 mmol/L (ref 3.5–5.1)
Sodium: 140 mmol/L (ref 135–145)
Total Bilirubin: 1.2 mg/dL (ref 0.3–1.2)
Total Protein: 7 g/dL (ref 6.5–8.1)

## 2022-10-06 NOTE — Progress Notes (Signed)
HEMATOLOGY/ONCOLOGY CLINIC NOTE  Date of Service: 10/06/2022  Patient Care Team: Anson Fret, MD as PCP - General (Family Medicine) Drema Halon, MD (Inactive)  CHIEF COMPLAINTS/PURPOSE OF CONSULTATION:  Evaluation and management of CLL  DIAGNOSIS: 67 year old man with CLL diagnosed in 2012 after presenting with lymphocytosis and adenopathy and stage IIIA disease.   Prior therapy:    Bendamustine and rituximab started in July 2017 and received total of 2 cycles. Therapy discontinued in August 2017 based on patient's wishes after achieving partial response.   He developed a relapse disease in April 2019. Ibrutinib 420 mg daily started in April 2019 and discontinued in September 2023 after poor adherence.   CURRENT THERAPY: Calquence 100 mg twice daily started in September 2023.  HISTORY OF PRESENTING ILLNESS:  Tyler Scott is a wonderful 67 y.o. male who has been a previous patient of Dr. Clelia Croft. He is here for evaluation and management of CLL.   Patient was last seen by Dr. Clelia Croft on 02/18/2022 and was doing well overall with no new medical concerns.  Today, he reports that he has been on and off different treatment since 2017, but has been on Calquence since September, 2023. He does admit that he does forget to take this medication a couple times a week. He reports that his previous treatment of Ibrutinib did cause him diarrhea.  He did receive two previous injection in his buttocks, but is unsure it is was steroids or antibiotics. Patient is UTD with influenza, RSV, and COVID-19 vaccinations. He denies any abdominal pain, but does note some discomfort along the spleen. He denies any bleeding issues, chest pain, diarrhea, resolved lumps/bumps in neck, infection issues, fevers, or chills.  He does note some SOB related to allergies and chest/sinus congestion. His symptoms are improving. He also reports that his neck edema had improved. He does complain of  sleep deprivation/poor sleep habits and notes some stress. He does typically sleep during the day.  He does take vitamin D and C supplements regularly. On average, he does drink 1 glass of wine a day and denies any major medication changes.  INTERVAL HISTORY:  Tyler Scott is a wonderful 67 y.o. male who has been a previous patient of Dr. Clelia Croft. He is here for continued evaluation and management of CLL.   Patient was last seen by me on 04/22/2022 and he complained of mild SOB related to allergies, chest/sinus congestion, sleep deprivation, poor sleep habit, and mild stress.   Patient notes he has bene doing well overall since our last visit. He complains of more frequent diarrhea. Patient notes he has been taking around 9 doses instead of 14 doses of Calquence per week.  Patient notes he has lost some weight since our last visit due to frequent diarrhea. He has bowel movement around 4-5 times a day. He has not been to his PCP regarding frequent diarrhea episodes.   Patient was previously treated with metformin for diabetes, which was discontinued around 10 years ago. He is currently getting treated with Insulin injection and Jardiance for diabetes. Patient discontinued Metformin since it was causing multiple episodes of diarrhea.   He complains of drenching night sweats, which is not a new symptoms. Patient denies checking his blood sugar levels at home. Patient complains of increased sweating when eating food.   He denies any new lumps/bumps, recent infection issues, fever, chills, abnormal bleeding/bruising, abdominal pain, chest pain, back pain, or leg swelling.   MEDICAL HISTORY:  Past Medical  History:  Diagnosis Date   Allergy    Arthritis    CLL (chronic lymphocytic leukemia) (HCC)    stage III: asymptomatic; on observation   DIABETES MELLITUS, TYPE I, UNCONTROLLED 12/30/2006   insulin   Diabetic retinopathy    ERECTILE DYSFUNCTION, ORGANIC 12/30/2006   Hx of adenomatous  polyp of colon 05/24/2014   HYPERLIPIDEMIA 12/30/2006   HYPERTENSION 12/30/2006   LOW BACK PAIN, ACUTE 12/21/2006    SURGICAL HISTORY: Past Surgical History:  Procedure Laterality Date   COLONOSCOPY     great right toe- Fused the I P joint      SOCIAL HISTORY: Social History   Socioeconomic History   Marital status: Married    Spouse name: Not on file   Number of children: Not on file   Years of education: Not on file   Highest education level: Not on file  Occupational History   Not on file  Tobacco Use   Smoking status: Never   Smokeless tobacco: Never  Vaping Use   Vaping status: Never Used  Substance and Sexual Activity   Alcohol use: Yes    Alcohol/week: 4.0 standard drinks of alcohol    Types: 4 Glasses of wine per week   Drug use: No   Sexual activity: Not on file    Comment: regular exercise - yes  Other Topics Concern   Not on file  Social History Narrative   Not on file   Social Determinants of Health   Financial Resource Strain: Not on file  Food Insecurity: Not on file  Transportation Needs: Not on file  Physical Activity: Not on file  Stress: Not on file  Social Connections: Unknown (06/12/2021)   Received from [facility]   Social Network    Social Network: Not on file  Intimate Partner Violence: Unknown (05/16/2021)   Received from [facility]   HITS    Physically Hurt: Not on file    Insult or Talk Down To: Not on file    Threaten Physical Harm: Not on file    Scream or Curse: Not on file    FAMILY HISTORY: Family History  Problem Relation Age of Onset   Alcohol abuse Other    Colon cancer Brother 4       Died 09-30-13   Diabetes Mellitus II Mother    Esophageal cancer Neg Hx    Rectal cancer Neg Hx    Stomach cancer Neg Hx     ALLERGIES:  has No Known Allergies.  MEDICATIONS:  Current Outpatient Medications  Medication Sig Dispense Refill   acalabrutinib maleate (CALQUENCE) 100 MG tablet Take 1 tablet (100 mg total) by mouth  2 (two) times daily. 60 tablet 3   Blood Glucose Monitoring Suppl (ONE TOUCH ULTRA 2) W/DEVICE KIT See admin instructions.  0   Cholecalciferol 50 MCG (2000 UT) TABS TAKE ONE TABLET BY MOUTH DAILY FOR LOW VITAMIN D.     doxepin (SINEQUAN) 25 MG capsule Take 1 capsule by mouth at bedtime.     empagliflozin (JARDIANCE) 25 MG TABS tablet Take 0.5 tablets by mouth every morning. Only if blood sugars get too high     finasteride (PROPECIA) 1 MG tablet Take 1 tablet by mouth daily.     finasteride (PROSCAR) 5 MG tablet Take 0.5 tablets by mouth daily.     Fluocin-Hydroquinone-Tretinoin (TRI-LUMA) 0.01-4-0.05 % CREA Apply 1 application. topically at bedtime. (Patient not taking: Reported on 12/11/2021) 30 g 2   Insulin Glargine (LANTUS SOLOSTAR)  100 UNIT/ML Solostar Pen INJECT 20 UNITS SUBCUTANEOUSLY AT BEDTIME 3 pen 11   INSULIN GLARGINE, 1 UNIT DIAL, [place] INJECT 20 UNITS SUBCUTANEOUSLY DAILY (Patient not taking: Reported on 12/11/2021)     insulin glargine-yfgn (SEMGLEE) 100 UNIT/ML Pen INJECT 22 UNITS SUBCUTANEOUSLY AT BEDTIME (USE WITHIN 28 DAYS AFTER OPENING PEN) (CONVERTED FROM LANTUS)     lisinopril (PRINIVIL,ZESTRIL) 40 MG tablet Take 1 tablet (40 mg total) by mouth daily. 90 tablet 4   meloxicam (MOBIC) 7.5 MG tablet TAKE 1 TABLET (7.5 MG TOTAL) BY MOUTH DAILY. (Patient taking differently: TAKE 1 TABLET (7.5 MG TOTAL) BY MOUTH DAILY. As needed) 30 tablet 1   ONE TOUCH ULTRA TEST test strip TEST GLUCOSE 3 TIMES A DAY 300 each 1   ONETOUCH DELICA LANCETS 33G MISC TEST GLUCOSE 3 TIMES A DAY 300 each 1   sildenafil (REVATIO) 20 MG tablet Take 1 to 2 tabs 2 - 3 hours before sex 30 tablet 11   simvastatin (ZOCOR) 20 MG tablet TAKE 1 TABLET (20 MG TOTAL) BY MOUTH AT BEDTIME. 100 tablet 4   No current facility-administered medications for this visit.    REVIEW OF SYSTEMS:    10 Point review of Systems was done is negative except as noted above.  PHYSICAL EXAMINATION: ECOG PERFORMANCE STATUS: 1 -  Symptomatic but completely ambulatory  . Vitals:   10/06/22 1305  BP: 121/69  Pulse: 87  Resp: 20  Temp: 98.1 F (36.7 C)  SpO2: 100%    Filed Weights   10/06/22 1305  Weight: 139 lb 6.4 oz (63.2 kg)  .Body mass index is 21.2 kg/m.  GENERAL:alert, in no acute distress and comfortable SKIN: no acute rashes, no significant lesions EYES: conjunctiva are pink and non-injected, sclera anicteric OROPHARYNX: MMM, no exudates, no oropharyngeal erythema or ulceration NECK: supple, no JVD LYMPH:  no palpable lymphadenopathy in the cervical, axillary or inguinal regions LUNGS: clear to auscultation b/l with normal respiratory effort HEART: regular rate & rhythm ABDOMEN:  normoactive bowel sounds , non tender, not distended. Extremity: no pedal edema PSYCH: alert & oriented x 3 with fluent speech NEURO: no focal motor/sensory deficits  LABORATORY DATA:  I have reviewed the data as listed .    Latest Ref Rng & Units 10/06/2022   12:09 PM 04/22/2022    2:08 PM 02/18/2022    9:09 AM  CBC  WBC 4.0 - 10.5 K/uL 72.3  114.5  171.0   Hemoglobin 13.0 - 17.0 g/dL 40.9  81.1  91.4   Hematocrit 39.0 - 52.0 % 50.2  43.9  40.8   Platelets 150 - 400 K/uL 154  145  137    .CMP     Component Value Date/Time   NA 140 10/06/2022 1209   NA 142 01/01/2017 0809   K 4.7 10/06/2022 1209   K 4.4 01/01/2017 0809   CL 107 10/06/2022 1209   CL 102 04/25/2012 1003   CO2 27 10/06/2022 1209   CO2 26 01/01/2017 0809   GLUCOSE 123 (H) 10/06/2022 1209   GLUCOSE 89 01/01/2017 0809   GLUCOSE 170 (H) 04/25/2012 1003   BUN 23 10/06/2022 1209   BUN 15.7 01/01/2017 0809   CREATININE 1.10 10/06/2022 1209   CREATININE 0.9 01/01/2017 0809   CALCIUM 9.4 10/06/2022 1209   CALCIUM 9.1 01/01/2017 0809   PROT 7.0 10/06/2022 1209   PROT 7.0 01/01/2017 0809   ALBUMIN 4.6 10/06/2022 1209   ALBUMIN 4.2 01/01/2017 0809   AST 16 10/06/2022 1209  AST 22 01/01/2017 0809   ALT 10 10/06/2022 1209   ALT 19  01/01/2017 0809   ALKPHOS 71 10/06/2022 1209   ALKPHOS 68 01/01/2017 0809   BILITOT 1.2 10/06/2022 1209   BILITOT 0.75 01/01/2017 0809   GFRNONAA >60 10/06/2022 1209   GFRAA >60 07/25/2019 0932   .    Latest Ref Rng & Units 10/06/2022   12:09 PM 04/22/2022    2:08 PM 02/18/2022    9:09 AM  CMP  Glucose 70 - 99 mg/dL 161  096  87   BUN 8 - 23 mg/dL 23  21  17    Creatinine 0.61 - 1.24 mg/dL 0.45  4.09  8.11   Sodium 135 - 145 mmol/L 140  142  137   Potassium 3.5 - 5.1 mmol/L 4.7  4.4  4.8   Chloride 98 - 111 mmol/L 107  108  101   CO2 22 - 32 mmol/L 27  27  28    Calcium 8.9 - 10.3 mg/dL 9.4  8.9  91.4   Total Protein 6.5 - 8.1 g/dL 7.0  7.1  7.5   Total Bilirubin 0.3 - 1.2 mg/dL 1.2  1.7  1.3   Alkaline Phos 38 - 126 U/L 71  65  71   AST 15 - 41 U/L 16  24  22    ALT 0 - 44 U/L 10  17  13     . RADIOGRAPHIC STUDIES: I have personally reviewed the radiological images as listed and agreed with the findings in the report. No results found.  ASSESSMENT & PLAN:   67 year old man with:      1.  CLL /SLL diagnosed in 2012 after presenting with stage IIIA, leukocytosis and lymphadenopathy.  He is currently on Calquence with continued response in his white cell count continues to drop appropriately. His hemoglobin and platelet count has also nearly normalized at this time.   2.  Lymphadenopathy: Related to his lymphoproliferative disorder with cervical adenopathy.  This has nearly resolved at this time.   3.  Tumor lysis syndrome: He is at low risk at this time despite his bulky disease and will continue to monitor his electrolytes for the time being.  PLAN: -Discussed lab results from today, 10/06/2022, with the patient. CBC shows elevated but improved WBC of 72.3 K. CMP is stable.  -Continue the current dosage of Calquence.  -Advised patient to try not to miss any dosage of Calquence.  -Recommend to follow-up with PCP regarding frequent diarrhea episodes and weight loss.     FOLLOW-UP: RTC with Dr Candise Che with labs in 3 months   The total time spent in the appointment was 21 minutes* .  All of the patient's questions were answered with apparent satisfaction. The patient knows to call the clinic with any problems, questions or concerns.   Wyvonnia Lora MD MS AAHIVMS [facility] Hematology/Oncology Physician [facility]  .*Total Encounter Time as defined by the Centers for Medicare and Medicaid Services includes, in addition to the face-to-face time of a patient visit (documented in the note above) non-face-to-face time: obtaining and reviewing outside history, ordering and reviewing medications, tests or procedures, care coordination (communications with other health care professionals or caregivers) and documentation in the medical record.   I,Param Shah,acting as a Neurosurgeon for Wyvonnia Lora, MD.,have documented all relevant documentation on the behalf of Wyvonnia Lora, MD,as directed by  Wyvonnia Lora, MD while in the presence of Wyvonnia Lora, MD.    .I have reviewed  the above documentation for accuracy and completeness, and I agree with the above. Johney Maine MD

## 2022-10-07 ENCOUNTER — Telehealth: Payer: Self-pay | Admitting: Hematology

## 2022-10-07 NOTE — Telephone Encounter (Signed)
Left patient a message about scheduled appointment times/dates

## 2022-12-14 ENCOUNTER — Other Ambulatory Visit: Payer: No Typology Code available for payment source

## 2022-12-14 ENCOUNTER — Ambulatory Visit: Payer: No Typology Code available for payment source | Admitting: Hematology

## 2022-12-31 ENCOUNTER — Encounter (INDEPENDENT_AMBULATORY_CARE_PROVIDER_SITE_OTHER): Payer: Medicare Other | Admitting: Ophthalmology

## 2022-12-31 DIAGNOSIS — I10 Essential (primary) hypertension: Secondary | ICD-10-CM | POA: Diagnosis not present

## 2022-12-31 DIAGNOSIS — E113593 Type 2 diabetes mellitus with proliferative diabetic retinopathy without macular edema, bilateral: Secondary | ICD-10-CM

## 2022-12-31 DIAGNOSIS — H35033 Hypertensive retinopathy, bilateral: Secondary | ICD-10-CM | POA: Diagnosis not present

## 2022-12-31 DIAGNOSIS — Z794 Long term (current) use of insulin: Secondary | ICD-10-CM

## 2022-12-31 DIAGNOSIS — H43813 Vitreous degeneration, bilateral: Secondary | ICD-10-CM

## 2023-02-16 ENCOUNTER — Other Ambulatory Visit: Payer: Self-pay

## 2023-02-16 DIAGNOSIS — C9110 Chronic lymphocytic leukemia of B-cell type not having achieved remission: Secondary | ICD-10-CM

## 2023-02-17 ENCOUNTER — Inpatient Hospital Stay: Payer: No Typology Code available for payment source | Attending: Hematology | Admitting: Hematology

## 2023-02-17 ENCOUNTER — Inpatient Hospital Stay: Payer: No Typology Code available for payment source

## 2023-02-17 VITALS — BP 105/59 | HR 85 | Temp 97.5°F | Resp 18 | Ht 68.0 in | Wt 144.4 lb

## 2023-02-17 DIAGNOSIS — C9110 Chronic lymphocytic leukemia of B-cell type not having achieved remission: Secondary | ICD-10-CM | POA: Diagnosis not present

## 2023-02-17 DIAGNOSIS — Z79899 Other long term (current) drug therapy: Secondary | ICD-10-CM | POA: Diagnosis not present

## 2023-02-17 LAB — CBC WITH DIFFERENTIAL (CANCER CENTER ONLY)
Abs Immature Granulocytes: 0.14 10*3/uL — ABNORMAL HIGH (ref 0.00–0.07)
Basophils Absolute: 0.1 10*3/uL (ref 0.0–0.1)
Basophils Relative: 0 %
Eosinophils Absolute: 0.1 10*3/uL (ref 0.0–0.5)
Eosinophils Relative: 0 %
HCT: 41.7 % (ref 39.0–52.0)
Hemoglobin: 13.5 g/dL (ref 13.0–17.0)
Immature Granulocytes: 0 %
Lymphocytes Relative: 93 %
Lymphs Abs: 94.9 10*3/uL — ABNORMAL HIGH (ref 0.7–4.0)
MCH: 30.9 pg (ref 26.0–34.0)
MCHC: 32.4 g/dL (ref 30.0–36.0)
MCV: 95.4 fL (ref 80.0–100.0)
Monocytes Absolute: 3.2 10*3/uL — ABNORMAL HIGH (ref 0.1–1.0)
Monocytes Relative: 3 %
Neutro Abs: 3.8 10*3/uL (ref 1.7–7.7)
Neutrophils Relative %: 4 %
Platelet Count: 142 10*3/uL — ABNORMAL LOW (ref 150–400)
RBC: 4.37 MIL/uL (ref 4.22–5.81)
RDW: 14.2 % (ref 11.5–15.5)
Smear Review: NORMAL
WBC Count: 102.1 10*3/uL (ref 4.0–10.5)
nRBC: 0 % (ref 0.0–0.2)

## 2023-02-17 LAB — CMP (CANCER CENTER ONLY)
ALT: 10 U/L (ref 0–44)
AST: 18 U/L (ref 15–41)
Albumin: 4.7 g/dL (ref 3.5–5.0)
Alkaline Phosphatase: 82 U/L (ref 38–126)
Anion gap: 7 (ref 5–15)
BUN: 28 mg/dL — ABNORMAL HIGH (ref 8–23)
CO2: 26 mmol/L (ref 22–32)
Calcium: 9.7 mg/dL (ref 8.9–10.3)
Chloride: 107 mmol/L (ref 98–111)
Creatinine: 1.42 mg/dL — ABNORMAL HIGH (ref 0.61–1.24)
GFR, Estimated: 54 mL/min — ABNORMAL LOW (ref >60)
Glucose, Bld: 54 mg/dL — ABNORMAL LOW (ref 70–99)
Potassium: 4.5 mmol/L (ref 3.5–5.1)
Sodium: 140 mmol/L (ref 135–145)
Total Bilirubin: 1.3 mg/dL — ABNORMAL HIGH (ref 0.0–1.2)
Total Protein: 7 g/dL (ref 6.5–8.1)

## 2023-02-17 NOTE — Progress Notes (Signed)
 HEMATOLOGY/ONCOLOGY CLINIC NOTE  Date of Service: 02/17/2023  Patient Care Team: Tyler Bruckner, MD as PCP - General (Family Medicine) Tyler Scott BRAVO, MD (Inactive)  CHIEF COMPLAINTS/PURPOSE OF CONSULTATION:  Evaluation and management of CLL  DIAGNOSIS: 68 year old man with CLL diagnosed in 2012 after presenting with lymphocytosis and adenopathy and stage IIIA disease.   Prior therapy:    Bendamustine  and rituximab  started in July 2017 and received total of 2 cycles. Therapy discontinued in August 2017 based on patient's wishes after achieving partial response.   He developed a relapse disease in April 2019. Ibrutinib  420 mg daily started in April 2019 and discontinued in September 2023 after poor adherence.   CURRENT THERAPY: Calquence  100 mg twice daily started in September 2023.  HISTORY OF PRESENTING ILLNESS:  Tyler Scott is a wonderful 68 y.o. male who has been a previous patient of Dr. Amadeo. He is here for evaluation and management of CLL.   Patient was last seen by Dr. Amadeo on 02/18/2022 and was doing well overall with no new medical concerns.  Today, he reports that he has been on and off different treatment since 2017, but has been on Calquence  since September, 2023. He does admit that he does forget to take this medication a couple times a week. He reports that his previous treatment of Ibrutinib  did cause him diarrhea.  He did receive two previous injection in his buttocks, but is unsure it is was steroids or antibiotics. Patient is UTD with influenza, RSV, and COVID-19 vaccinations. He denies any abdominal pain, but does note some discomfort along the spleen. He denies any bleeding issues, chest pain, diarrhea, resolved lumps/bumps in neck, infection issues, fevers, or chills.  He does note some SOB related to allergies and chest/sinus congestion. His symptoms are improving. He also reports that his neck edema had improved. He does complain of sleep  deprivation/poor sleep habits and notes some stress. He does typically sleep during the day.  He does take vitamin D  and C supplements regularly. On average, he does drink 1 glass of wine a day and denies any major medication changes.  INTERVAL HISTORY:  Tyler Scott is a wonderful 68 y.o. male who has been a previous patient of Dr. Amadeo. He is here for continued evaluation and management of CLL.   Patient was last seen by me on 10/06/2022 and complained of weight loss attributed to more frequent diarrhea, stable drenching night sweats, and increased sweating when consuming food.   Today, he reports that he typically misses 1-2 doses of Calquence  a day due to forgetting to take the medication. He reports that he generally at least takes calquence  once a day and misses the second dose about half the time. Patient reports that a couple months ago, he did not take Calquence  for at least 3-4 weeks due to running out and waiting for a refill.   Patient complains of diarrhea. He is unsure if his diarrhea improved while he was off of Calquence  for 3-4 weeks and is unsure if his diarrhea is related to Acalabrutinib  in general.   He denies any new lumps/bumps. Patient reports being told that there are small lymph nodes in the neck, which he is unable to feel. Patient denies any abdominal pain or leg swelling.   He reports that he generally does not stay well-hydrated.   MEDICAL HISTORY:  Past Medical History:  Diagnosis Date   Allergy    Arthritis    CLL (chronic lymphocytic leukemia) (  HCC)    stage III: asymptomatic; on observation   DIABETES MELLITUS, TYPE I, UNCONTROLLED 12/30/2006   insulin    Diabetic retinopathy    ERECTILE DYSFUNCTION, ORGANIC 12/30/2006   Hx of adenomatous polyp of colon 05/24/2014   HYPERLIPIDEMIA 12/30/2006   HYPERTENSION 12/30/2006   LOW BACK PAIN, ACUTE 12/21/2006    SURGICAL HISTORY: Past Surgical History:  Procedure Laterality Date   COLONOSCOPY      great right toe- Fused the I P joint      SOCIAL HISTORY: Social History   Socioeconomic History   Marital status: Married    Spouse name: Not on file   Number of children: Not on file   Years of education: Not on file   Highest education level: Not on file  Occupational History   Not on file  Tobacco Use   Smoking status: Never   Smokeless tobacco: Never  Vaping Use   Vaping status: Never Used  Substance and Sexual Activity   Alcohol  use: Yes    Alcohol /week: 4.0 standard drinks of alcohol     Types: 4 Glasses of wine per week   Drug use: No   Sexual activity: Not on file    Comment: regular exercise - yes  Other Topics Concern   Not on file  Social History Narrative   Not on file   Social Drivers of Health   Financial Resource Strain: Not on file  Food Insecurity: Not on file  Transportation Needs: Not on file  Physical Activity: Not on file  Stress: Not on file  Social Connections: Unknown (06/12/2021)   Received from [facility], [facility]   Social Network    Social Network: Not on file  Intimate Partner Violence: Unknown (05/16/2021)   Received from [employer], [facility]   HITS    Physically Hurt: Not on file    Insult or Talk Down To: Not on file    Threaten Physical Harm: Not on file    Scream or Curse: Not on file    FAMILY HISTORY: Family History  Problem Relation Age of Onset   Alcohol  abuse Other    Colon cancer Brother 70       Died 10/03/13   Diabetes Mellitus II Mother    Esophageal cancer Neg Hx    Rectal cancer Neg Hx    Stomach cancer Neg Hx     ALLERGIES:  has no known allergies.  MEDICATIONS:  Current Outpatient Medications  Medication Sig Dispense Refill   acalabrutinib  maleate (CALQUENCE ) 100 MG tablet Take 1 tablet (100 mg total) by mouth 2 (two) times daily. 60 tablet 3   Blood Glucose Monitoring Suppl (ONE TOUCH ULTRA 2) W/DEVICE KIT See admin instructions.  0   Cholecalciferol 50 MCG (2000 UT) TABS TAKE ONE TABLET BY  MOUTH DAILY FOR LOW VITAMIN D .     doxepin  (SINEQUAN ) 25 MG capsule Take 1 capsule by mouth at bedtime.     empagliflozin  (JARDIANCE ) 25 MG TABS tablet Take 0.5 tablets by mouth every morning. Only if blood sugars get too high     finasteride  (PROPECIA ) 1 MG tablet Take 1 tablet by mouth daily.     finasteride  (PROSCAR ) 5 MG tablet Take 0.5 tablets by mouth daily.     Fluocin-Hydroquinone-Tretinoin (TRI-LUMA ) 0.01-4-0.05 % CREA Apply 1 application. topically at bedtime. (Patient not taking: Reported on 12/11/2021) 30 g 2   Insulin  Glargine (LANTUS  SOLOSTAR) 100 UNIT/ML Solostar Pen INJECT 20 UNITS SUBCUTANEOUSLY AT BEDTIME 3 pen 11  INSULIN  GLARGINE, 1 UNIT DIAL, [place] INJECT 20 UNITS SUBCUTANEOUSLY DAILY (Patient not taking: Reported on 12/11/2021)     insulin  glargine-yfgn (SEMGLEE ) 100 UNIT/ML Pen INJECT 22 UNITS SUBCUTANEOUSLY AT BEDTIME (USE WITHIN 28 DAYS AFTER OPENING PEN) (CONVERTED FROM LANTUS )     lisinopril  (PRINIVIL ,ZESTRIL ) 40 MG tablet Take 1 tablet (40 mg total) by mouth daily. 90 tablet 4   meloxicam  (MOBIC ) 7.5 MG tablet TAKE 1 TABLET (7.5 MG TOTAL) BY MOUTH DAILY. (Patient taking differently: TAKE 1 TABLET (7.5 MG TOTAL) BY MOUTH DAILY. As needed) 30 tablet 1   ONE TOUCH ULTRA TEST test strip TEST GLUCOSE 3 TIMES A DAY 300 each 1   ONETOUCH DELICA LANCETS 33G MISC TEST GLUCOSE 3 TIMES A DAY 300 each 1   sildenafil  (REVATIO ) 20 MG tablet Take 1 to 2 tabs 2 - 3 hours before sex 30 tablet 11   simvastatin  (ZOCOR ) 20 MG tablet TAKE 1 TABLET (20 MG TOTAL) BY MOUTH AT BEDTIME. 100 tablet 4   No current facility-administered medications for this visit.    REVIEW OF SYSTEMS:    10 Point review of Systems was done is negative except as noted above.   PHYSICAL EXAMINATION: ECOG PERFORMANCE STATUS: 1 - Symptomatic but completely ambulatory  . Vitals:   02/17/23 1015  BP: (!) 105/59  Pulse: 85  Resp: 18  Temp: (!) 97.5 F (36.4 C)  SpO2: 100%     Filed Weights   02/17/23 1015   Weight: 144 lb 6.4 oz (65.5 kg)   .Body mass index is 21.96 kg/m.  GENERAL:alert, in no acute distress and comfortable SKIN: no acute rashes, no significant lesions EYES: conjunctiva are pink and non-injected, sclera anicteric OROPHARYNX: MMM, no exudates, no oropharyngeal erythema or ulceration NECK: supple, no JVD LYMPH:  no palpable lymphadenopathy in the cervical, axillary or inguinal regions LUNGS: clear to auscultation b/l with normal respiratory effort HEART: regular rate & rhythm ABDOMEN:  normoactive bowel sounds , non tender, not distended. Extremity: no pedal edema PSYCH: alert & oriented x 3 with fluent speech NEURO: no focal motor/sensory deficits   LABORATORY DATA:  I have reviewed the data as listed .    Latest Ref Rng & Units 02/17/2023    9:46 AM 10/06/2022   12:09 PM 04/22/2022    2:08 PM  CBC  WBC 4.0 - 10.5 K/uL 102.1  72.3  114.5   Hemoglobin 13.0 - 17.0 g/dL 86.4  83.5  86.2   Hematocrit 39.0 - 52.0 % 41.7  50.2  43.9   Platelets 150 - 400 K/uL 142  154  145    .CMP     Component Value Date/Time   NA 140 02/17/2023 0946   NA 142 01/01/2017 0809   K 4.5 02/17/2023 0946   K 4.4 01/01/2017 0809   CL 107 02/17/2023 0946   CL 102 04/25/2012 1003   CO2 26 02/17/2023 0946   CO2 26 01/01/2017 0809   GLUCOSE 54 (L) 02/17/2023 0946   GLUCOSE 89 01/01/2017 0809   GLUCOSE 170 (H) 04/25/2012 1003   BUN 28 (H) 02/17/2023 0946   BUN 15.7 01/01/2017 0809   CREATININE 1.42 (H) 02/17/2023 0946   CREATININE 0.9 01/01/2017 0809   CALCIUM  9.7 02/17/2023 0946   CALCIUM  9.1 01/01/2017 0809   PROT 7.0 02/17/2023 0946   PROT 7.0 01/01/2017 0809   ALBUMIN 4.7 02/17/2023 0946   ALBUMIN 4.2 01/01/2017 0809   AST 18 02/17/2023 0946   AST 22 01/01/2017 0809  ALT 10 02/17/2023 0946   ALT 19 01/01/2017 0809   ALKPHOS 82 02/17/2023 0946   ALKPHOS 68 01/01/2017 0809   BILITOT 1.3 (H) 02/17/2023 0946   BILITOT 0.75 01/01/2017 0809   GFRNONAA 54 (L) 02/17/2023 0946    GFRAA >60 07/25/2019 0932   .    Latest Ref Rng & Units 02/17/2023    9:46 AM 10/06/2022   12:09 PM 04/22/2022    2:08 PM  CMP  Glucose 70 - 99 mg/dL 54  876  860   BUN 8 - 23 mg/dL 28  23  21    Creatinine 0.61 - 1.24 mg/dL 8.57  8.89  9.20   Sodium 135 - 145 mmol/L 140  140  142   Potassium 3.5 - 5.1 mmol/L 4.5  4.7  4.4   Chloride 98 - 111 mmol/L 107  107  108   CO2 22 - 32 mmol/L 26  27  27    Calcium  8.9 - 10.3 mg/dL 9.7  9.4  8.9   Total Protein 6.5 - 8.1 g/dL 7.0  7.0  7.1   Total Bilirubin 0.0 - 1.2 mg/dL 1.3  1.2  1.7   Alkaline Phos 38 - 126 U/L 82  71  65   AST 15 - 41 U/L 18  16  24    ALT 0 - 44 U/L 10  10  17     . RADIOGRAPHIC STUDIES: I have personally reviewed the radiological images as listed and agreed with the findings in the report. No results found.  ASSESSMENT & PLAN:   68 year old man with:      1.  CLL /SLL diagnosed in 2012 after presenting with stage IIIA, leukocytosis and lymphadenopathy.  He is currently on Calquence  with continued response in his white cell count continues to drop appropriately. His hemoglobin and platelet count has also nearly normalized at this time.   2.  Lymphadenopathy: Related to his lymphoproliferative disorder with cervical adenopathy.  This has nearly resolved at this time.   3.  Tumor lysis syndrome: He is at low risk at this time despite his bulky disease and will continue to monitor his electrolytes for the time being.  PLAN:  -Discussed lab results on 02/17/23 in detail with patient. CBC showed WBC of 102.1K, hemoglobin of 13.5, and platelets of 142K. -WBCs are not controlled. WBCs were previously 72.3 on 10/06/2022 and have since increased to 102.1 currently.  -discussed that it is difficult to determine whether lab findings are due to Calquence  not controlling the disease or due to patient not taking the medication regularly as prescribed.  -there is a mild drop in platelets and hemoglobin -CMP showed mildly low blood  sugar levels and mild dehydration -advised patient to stay well-hydrated -discussed potential proceeding treatment options, including: taking Acaubrutinib as prescribed, 100 MG tablet twice a day Trying Zanubrutinib once a day Switching to a different regimen involving venetoplax targeted therapy combined with Gazyva   -discussed that if patient chooses to switch his treatment regimen, a PET scan would be recommended prior to switching to new treatment to assess his response -Patient is inclined to consider a new treatment regimen. Will plan for PET scan and then plan to switch to a new treatment regimen -will hold Acalabrutinib  and plan for PET scan in 1-2 weeks. Advised patient to monitor whether his diarrhea improves while he is off of Acalabrutinib .  -upon physical examination, patient did endorse small lymph nodes in the neck, though there were no significant findings.  -answered  all of patient's questions in detail  FOLLOW-UP: PET/CT in 1-2 weeks RTC with Dr Onesimo in 4 weeks  The total time spent in the appointment was 30 minutes* .  All of the patient's questions were answered with apparent satisfaction. The patient knows to call the clinic with any problems, questions or concerns.   Emaline Onesimo MD MS AAHIVMS [facility] Hematology/Oncology Physician [facility]  .*Total Encounter Time as defined by the Centers for Medicare and Medicaid Services includes, in addition to the face-to-face time of a patient visit (documented in the note above) non-face-to-face time: obtaining and reviewing outside history, ordering and reviewing medications, tests or procedures, care coordination (communications with other health care professionals or caregivers) and documentation in the medical record.    I,Mitra Faeizi,acting as a neurosurgeon for Emaline Onesimo, MD.,have documented all relevant documentation on the behalf of Emaline Onesimo, MD,as directed by  Emaline Onesimo, MD while in the presence of  Emaline Onesimo, MD.  .I have reviewed the above documentation for accuracy and completeness, and I agree with the above. .Avontae Burkhead Kishore Jaimen Melone MD

## 2023-02-18 ENCOUNTER — Telehealth: Payer: Self-pay | Admitting: Hematology

## 2023-02-18 NOTE — Telephone Encounter (Signed)
 Left patient a vm regarding upcoming appointment

## 2023-03-05 ENCOUNTER — Ambulatory Visit (HOSPITAL_COMMUNITY)
Admission: RE | Admit: 2023-03-05 | Discharge: 2023-03-05 | Disposition: A | Discharge status: Home / Self Care | Payer: Medicare Other | Source: Ambulatory Visit | Attending: Hematology

## 2023-03-05 DIAGNOSIS — C9110 Chronic lymphocytic leukemia of B-cell type not having achieved remission: Secondary | ICD-10-CM | POA: Diagnosis present

## 2023-03-05 LAB — GLUCOSE, CAPILLARY: Glucose-Capillary: 68 mg/dL — ABNORMAL LOW (ref 70–99)

## 2023-03-05 MED ORDER — FLUDEOXYGLUCOSE F - 18 (FDG) INJECTION
7.0000 | Freq: Once | INTRAVENOUS | Status: AC | PRN
  Administered 2023-03-05: 7 via INTRAVENOUS

## 2023-03-17 ENCOUNTER — Inpatient Hospital Stay: Payer: No Typology Code available for payment source | Attending: Hematology | Admitting: Hematology

## 2023-03-17 VITALS — BP 146/71 | HR 87 | Temp 97.5°F | Resp 18 | Ht 68.0 in | Wt 143.0 lb

## 2023-03-17 DIAGNOSIS — Z5112 Encounter for antineoplastic immunotherapy: Secondary | ICD-10-CM | POA: Insufficient documentation

## 2023-03-17 DIAGNOSIS — Z800 Family history of malignant neoplasm of digestive organs: Secondary | ICD-10-CM | POA: Insufficient documentation

## 2023-03-17 DIAGNOSIS — C9110 Chronic lymphocytic leukemia of B-cell type not having achieved remission: Secondary | ICD-10-CM | POA: Insufficient documentation

## 2023-03-17 DIAGNOSIS — R161 Splenomegaly, not elsewhere classified: Secondary | ICD-10-CM | POA: Diagnosis not present

## 2023-03-17 NOTE — Progress Notes (Signed)
 HEMATOLOGY/ONCOLOGY CLINIC NOTE  Date of Service: 03/17/2023  Patient Care Team: Tyler Bruckner, MD as PCP - General (Family Medicine) Ethyl Scott BRAVO, MD (Inactive)  CHIEF COMPLAINTS/PURPOSE OF CONSULTATION:  Evaluation and management of CLL  DIAGNOSIS: 68 year old man with CLL diagnosed in 2012 after presenting with lymphocytosis and adenopathy and stage IIIA disease.   Prior therapy:    Bendamustine  and rituximab  started in July 2017 and received total of 2 cycles. Therapy discontinued in August 2017 based on patient's wishes after achieving partial response.   He developed a relapse disease in April 2019. Ibrutinib  420 mg daily started in April 2019 and discontinued in September 2023 after poor adherence.   CURRENT THERAPY: Calquence  100 mg twice daily started in September 2023.  HISTORY OF PRESENTING ILLNESS:  Tyler Scott is a wonderful 68 y.o. male who has been a previous patient of Dr. Amadeo. He is here for evaluation and management of CLL.   Patient was last seen by Dr. Amadeo on 02/18/2022 and was doing well overall with no new medical concerns.  Today, he reports that he has been on and off different treatment since 2017, but has been on Calquence  since September, 2023. He does admit that he does forget to take this medication a couple times a week. He reports that his previous treatment of Ibrutinib  did cause him diarrhea.  He did receive two previous injection in his buttocks, but is unsure it is was steroids or antibiotics. Patient is UTD with influenza, RSV, and COVID-19 vaccinations. He denies any abdominal pain, but does note some discomfort along the spleen. He denies any bleeding issues, chest pain, diarrhea, resolved lumps/bumps in neck, infection issues, fevers, or chills.  He does note some SOB related to allergies and chest/sinus congestion. His symptoms are improving. He also reports that his neck edema had improved. He does complain of sleep  deprivation/poor sleep habits and notes some stress. He does typically sleep during the day.  He does take vitamin D  and C supplements regularly. On average, he does drink 1 glass of wine a day and denies any major medication changes.  INTERVAL HISTORY:  Tyler Scott is a wonderful 68 y.o. male here for continued evaluation and management of CLL.   Patient was last seen by me on 02/17/2023 and complained of diarrhea and reported being told that there small lymph nodes in the neck which he was unable to feel.  Today, he reports no new symptoms since his last clinical visit. Patient report endorsing plenty of night sweats and denies any fever or chills.   He reports that he has lost a couple of pounds in the last month. His weight is 143 pounds in clinic today.   Patient denies any abdominal pain. He reports fluctuating bowel habits between diarrhea and constipation.  Patient reports that he sleeps frequently though his fatigue does not limit his daily activities.   Patient reports that he lives near the [place] area.   MEDICAL HISTORY:  Past Medical History:  Diagnosis Date   Allergy    Arthritis    CLL (chronic lymphocytic leukemia) (HCC)    stage III: asymptomatic; on observation   DIABETES MELLITUS, TYPE I, UNCONTROLLED 12/30/2006   insulin    Diabetic retinopathy    ERECTILE DYSFUNCTION, ORGANIC 12/30/2006   Hx of adenomatous polyp of colon 05/24/2014   HYPERLIPIDEMIA 12/30/2006   HYPERTENSION 12/30/2006   LOW BACK PAIN, ACUTE 12/21/2006    SURGICAL HISTORY: Past Surgical History:  Procedure  Laterality Date   COLONOSCOPY     great right toe- Fused the I P joint      SOCIAL HISTORY: Social History   Socioeconomic History   Marital status: Married    Spouse name: Not on file   Number of children: Not on file   Years of education: Not on file   Highest education level: Not on file  Occupational History   Not on file  Tobacco Use   Smoking status: Never    Smokeless tobacco: Never  Vaping Use   Vaping status: Never Used  Substance and Sexual Activity   Alcohol  use: Yes    Alcohol /week: 4.0 standard drinks of alcohol     Types: 4 Glasses of wine per week   Drug use: No   Sexual activity: Not on file    Comment: regular exercise - yes  Other Topics Concern   Not on file  Social History Narrative   Not on file   Social Drivers of Health   Financial Resource Strain: Not on file  Food Insecurity: Not on file  Transportation Needs: Not on file  Physical Activity: Not on file  Stress: Not on file  Social Connections: Unknown (06/12/2021)   Received from [facility]   Social Network    Social Network: Not on file  Intimate Partner Violence: Unknown (05/16/2021)   Received from [facility]   HITS    Physically Hurt: Not on file    Insult or Talk Down To: Not on file    Threaten Physical Harm: Not on file    Scream or Curse: Not on file    FAMILY HISTORY: Family History  Problem Relation Age of Onset   Alcohol  abuse Other    Colon cancer Brother 26       Died 09-23-2013   Diabetes Mellitus II Mother    Esophageal cancer Neg Hx    Rectal cancer Neg Hx    Stomach cancer Neg Hx     ALLERGIES:  has no known allergies.  MEDICATIONS:  Current Outpatient Medications  Medication Sig Dispense Refill   acalabrutinib  maleate (CALQUENCE ) 100 MG tablet Take 1 tablet (100 mg total) by mouth 2 (two) times daily. 60 tablet 3   Blood Glucose Monitoring Suppl (ONE TOUCH ULTRA 2) W/DEVICE KIT See admin instructions.  0   Cholecalciferol 50 MCG (2000 UT) TABS TAKE ONE TABLET BY MOUTH DAILY FOR LOW VITAMIN D .     doxepin  (SINEQUAN ) 25 MG capsule Take 1 capsule by mouth at bedtime.     empagliflozin  (JARDIANCE ) 25 MG TABS tablet Take 0.5 tablets by mouth every morning. Only if blood sugars get too high     finasteride  (PROPECIA ) 1 MG tablet Take 1 tablet by mouth daily.     finasteride  (PROSCAR ) 5 MG tablet Take 0.5  tablets by mouth daily.     Fluocin-Hydroquinone-Tretinoin (TRI-LUMA ) 0.01-4-0.05 % CREA Apply 1 application. topically at bedtime. (Patient not taking: Reported on 12/11/2021) 30 g 2   Insulin  Glargine (LANTUS  SOLOSTAR) 100 UNIT/ML Solostar Pen INJECT 20 UNITS SUBCUTANEOUSLY AT BEDTIME 3 pen 11   INSULIN  GLARGINE, 1 UNIT DIAL, [place] INJECT 20 UNITS SUBCUTANEOUSLY DAILY (Patient not taking: Reported on 12/11/2021)     insulin  glargine-yfgn (SEMGLEE ) 100 UNIT/ML Pen INJECT 22 UNITS SUBCUTANEOUSLY AT BEDTIME (USE WITHIN 28 DAYS AFTER OPENING PEN) (CONVERTED FROM LANTUS )     lisinopril  (PRINIVIL ,ZESTRIL ) 40 MG tablet Take 1 tablet (40 mg total) by mouth daily. 90 tablet 4  meloxicam  (MOBIC ) 7.5 MG tablet TAKE 1 TABLET (7.5 MG TOTAL) BY MOUTH DAILY. (Patient taking differently: TAKE 1 TABLET (7.5 MG TOTAL) BY MOUTH DAILY. As needed) 30 tablet 1   ONE TOUCH ULTRA TEST test strip TEST GLUCOSE 3 TIMES A DAY 300 each 1   ONETOUCH DELICA LANCETS 33G MISC TEST GLUCOSE 3 TIMES A DAY 300 each 1   sildenafil  (REVATIO ) 20 MG tablet Take 1 to 2 tabs 2 - 3 hours before sex 30 tablet 11   simvastatin  (ZOCOR ) 20 MG tablet TAKE 1 TABLET (20 MG TOTAL) BY MOUTH AT BEDTIME. 100 tablet 4   No current facility-administered medications for this visit.    REVIEW OF SYSTEMS:    10 Point review of Systems was done is negative except as noted above.   PHYSICAL EXAMINATION: ECOG PERFORMANCE STATUS: 1 - Symptomatic but completely ambulatory  . Vitals:   03/17/23 1426  BP: (!) 146/71  Pulse: 87  Resp: 18  Temp: (!) 97.5 F (36.4 C)  SpO2: 100%   Filed Weights   03/17/23 1426  Weight: 143 lb (64.9 kg)    .Body mass index is 21.74 kg/m. GENERAL:alert, in no acute distress and comfortable SKIN: no acute rashes, no significant lesions EYES: conjunctiva are pink and non-injected, sclera anicteric OROPHARYNX: MMM, no exudates, no oropharyngeal erythema or ulceration NECK: supple, no JVD LYMPH:  no palpable  lymphadenopathy in the cervical, axillary or inguinal regions LUNGS: clear to auscultation b/l with normal respiratory effort HEART: regular rate & rhythm ABDOMEN:  normoactive bowel sounds , non tender, not distended. Extremity: no pedal edema PSYCH: alert & oriented x 3 with fluent speech NEURO: no focal motor/sensory deficits   LABORATORY DATA:  I have reviewed the data as listed .    Latest Ref Rng & Units 02/17/2023    9:46 AM 10/06/2022   12:09 PM 04/22/2022    2:08 PM  CBC  WBC 4.0 - 10.5 K/uL 102.1  72.3  114.5   Hemoglobin 13.0 - 17.0 g/dL 86.4  83.5  86.2   Hematocrit 39.0 - 52.0 % 41.7  50.2  43.9   Platelets 150 - 400 K/uL 142  154  145    .CMP     Component Value Date/Time   NA 140 02/17/2023 0946   NA 142 01/01/2017 0809   K 4.5 02/17/2023 0946   K 4.4 01/01/2017 0809   CL 107 02/17/2023 0946   CL 102 04/25/2012 1003   CO2 26 02/17/2023 0946   CO2 26 01/01/2017 0809   GLUCOSE 54 (L) 02/17/2023 0946   GLUCOSE 89 01/01/2017 0809   GLUCOSE 170 (H) 04/25/2012 1003   BUN 28 (H) 02/17/2023 0946   BUN 15.7 01/01/2017 0809   CREATININE 1.42 (H) 02/17/2023 0946   CREATININE 0.9 01/01/2017 0809   CALCIUM  9.7 02/17/2023 0946   CALCIUM  9.1 01/01/2017 0809   PROT 7.0 02/17/2023 0946   PROT 7.0 01/01/2017 0809   ALBUMIN 4.7 02/17/2023 0946   ALBUMIN 4.2 01/01/2017 0809   AST 18 02/17/2023 0946   AST 22 01/01/2017 0809   ALT 10 02/17/2023 0946   ALT 19 01/01/2017 0809   ALKPHOS 82 02/17/2023 0946   ALKPHOS 68 01/01/2017 0809   BILITOT 1.3 (H) 02/17/2023 0946   BILITOT 0.75 01/01/2017 0809   GFRNONAA 54 (L) 02/17/2023 0946   GFRAA >60 07/25/2019 0932   .    Latest Ref Rng & Units 02/17/2023    9:46 AM 10/06/2022   12:09  PM 04/22/2022    2:08 PM  CMP  Glucose 70 - 99 mg/dL 54  876  860   BUN 8 - 23 mg/dL 28  23  21    Creatinine 0.61 - 1.24 mg/dL 8.57  8.89  9.20   Sodium 135 - 145 mmol/L 140  140  142   Potassium 3.5 - 5.1 mmol/L 4.5  4.7  4.4   Chloride 98 -  111 mmol/L 107  107  108   CO2 22 - 32 mmol/L 26  27  27    Calcium  8.9 - 10.3 mg/dL 9.7  9.4  8.9   Total Protein 6.5 - 8.1 g/dL 7.0  7.0  7.1   Total Bilirubin 0.0 - 1.2 mg/dL 1.3  1.2  1.7   Alkaline Phos 38 - 126 U/L 82  71  65   AST 15 - 41 U/L 18  16  24    ALT 0 - 44 U/L 10  10  17     . RADIOGRAPHIC STUDIES: I have personally reviewed the radiological images as listed and agreed with the findings in the report. NM PET Image Restag (PS) Skull Base To Thigh Result Date: 03/14/2023 CLINICAL DATA:  Initial treatment strategy for CLL. EXAM: NUCLEAR MEDICINE PET SKULL BASE TO THIGH TECHNIQUE: 7.0 mCi F-18 FDG was injected intravenously. Full-ring PET imaging was performed from the skull base to thigh after the radiotracer. CT data was obtained and used for attenuation correction and anatomic localization. Fasting blood glucose: 68 mg/dl COMPARISON:  CT neck 94/76/7976, CT abdomen pelvis 02/16/2017. FINDINGS: Mediastinal blood pool activity: SUV max 2.4 Liver activity: SUV max 3.0 NECK: Numerous cervical lymph nodes, none of which are hypermetabolic. Incidental CT findings: None. CHEST: No abnormal hypermetabolism. For example, a low left internal jugular nodal mass measures 2.7 cm (4/47), SUV max 2.2. A second index lymph node in the left axilla has an SUV max of 1.8. No abnormal hypermetabolism. Incidental CT findings: Extensive mediastinal, axillary, prepericardiac and extrapleural adenopathy. Atherosclerotic calcification of the aorta and coronary arteries. Heart is at the upper limits of normal in size to mildly enlarged. No pericardial or pleural effusion. ABDOMEN/PELVIS: Extensive retrocrural, abdominal peritoneal ligament, retroperitoneal, small bowel mesenteric and inguinal adenopathy, ranging from hypometabolic to borderline hypermetabolic. Index left common iliac lymph node measures approximately 1.8 cm (4/151), SUV max 2.5. Incidental CT findings: Small low-attenuation lesions in the kidneys.  No specific follow-up necessary. Spleen measures approximately 19.0 cm. Atherosclerotic calcification of the aorta. SKELETON: No abnormal hypermetabolism. Incidental CT findings: Benign-appearing sclerotic lesion in the right superior pubic ramus. Degenerative changes in the spine. IMPRESSION: 1. Bulky adenopathy throughout the neck, chest, abdomen and pelvis, ranging from mostly hypometabolic to borderline hypermetabolic, indicative of largely quiescent disease. 2. Splenomegaly. 3. Aortic atherosclerosis (ICD10-I70.0). Coronary artery calcification. Electronically Signed   By: Newell Eke M.D.   On: 03/14/2023 15:17    ASSESSMENT & PLAN:   68 year old man with:      1.  CLL /SLL diagnosed in 2012 after presenting with stage IIIA, leukocytosis and lymphadenopathy.  He is currently on Calquence  with continued response in his white cell count continues to drop appropriately. His hemoglobin and platelet count has also nearly normalized at this time.   2.  Lymphadenopathy: Related to his lymphoproliferative disorder with cervical adenopathy.  This has nearly resolved at this time.   3.  Tumor lysis syndrome: He is at low risk at this time despite his bulky disease and will continue to monitor his electrolytes  for the time being.  PLAN:  -There is obvious increased burden of disease from CLL based on his PET scan on 03/05/2023, with findings of extensive enlarged lymph nodes in the neck, chest, abdomen, and pelvis as well as splenomegaly with spleen measuring about 19.0 cm in size.  -WBCs increased from 70K to 100K in four months -I did feel extensive small enlarged lymph nodes during physical examination as well as an enlarged spleen  -his symptom of night sweats would factor in as an indication to treat -discussed concern that his disease is not controlled well with Acalabrutinib . Also discussed that missed doses could also be playing a role in his uncontrolled disease.  -would recommend a  combination of IV antibody treatment called Gazyva  once a week for one month, then once a month for 6 months; with Venetoclax  targeted therapy which would be started in the 2nd-3rd month and continue for one year. Discussed that if he responds well to treatment, he could potentially complete the treatment regimen after one year.  -Discussed the risk of allergic reaction with antibody treatment, which he would be monitored closely for.  -discussed that venetoclax  can cause tumor lysis syndrome -also discussed option to try taking Acalabrutinib  compliantly as prescribed -patient is agreeable to switching to Gazyva  and venetoclax  targeted therapy in the next 1-2 weeks -will set up education session with chemotherapy nurses to discuss details of treatment -discussed that the risk of splenic rupture is relatively low if the spleen enlarges slowly rather than with fast enlargement.  -answered all of patient's questions in detail  FOLLOW-UP: -Chemo-counseling for Gazyva /Venetoclax  for rx of progressive CLL -will plan to start treatment in 2-3 weeks  The total time spent in the appointment was 40 minutes* .  All of the patient's questions were answered with apparent satisfaction. The patient knows to call the clinic with any problems, questions or concerns.   Emaline Saran MD MS AAHIVMS [facility] Hematology/Oncology Physician [facility]  .*Total Encounter Time as defined by the Centers for Medicare and Medicaid Services includes, in addition to the face-to-face time of a patient visit (documented in the note above) non-face-to-face time: obtaining and reviewing outside history, ordering and reviewing medications, tests or procedures, care coordination (communications with other health care professionals or caregivers) and documentation in the medical record.    I,Mitra Faeizi,acting as a neurosurgeon for Emaline Saran, MD.,have documented all relevant documentation on the behalf of Emaline Saran,  MD,as directed by  Emaline Saran, MD while in the presence of Emaline Saran, MD.  .I have reviewed the above documentation for accuracy and completeness, and I agree with the above. .Lavaughn Bisig Kishore Yudith Norlander MD

## 2023-03-19 ENCOUNTER — Telehealth: Payer: Self-pay | Admitting: Hematology

## 2023-03-19 NOTE — Telephone Encounter (Signed)
 Left patient a vm regarding upcoming appointment

## 2023-03-24 ENCOUNTER — Telehealth: Payer: Self-pay

## 2023-03-24 ENCOUNTER — Encounter: Payer: Self-pay | Admitting: Hematology

## 2023-03-24 ENCOUNTER — Other Ambulatory Visit (HOSPITAL_COMMUNITY): Payer: Self-pay

## 2023-03-24 ENCOUNTER — Other Ambulatory Visit: Payer: Self-pay

## 2023-03-24 MED ORDER — PROCHLORPERAZINE MALEATE 10 MG PO TABS
10.0000 mg | ORAL_TABLET | Freq: Four times a day (QID) | ORAL | 1 refills | Status: DC | PRN
  Filled 2023-03-24: qty 30, 8d supply, fill #0

## 2023-03-24 MED ORDER — ACYCLOVIR 400 MG PO TABS
400.0000 mg | ORAL_TABLET | Freq: Every day | ORAL | 5 refills | Status: DC
  Filled 2023-03-24: qty 30, 30d supply, fill #0

## 2023-03-24 MED ORDER — ONDANSETRON HCL 8 MG PO TABS
8.0000 mg | ORAL_TABLET | Freq: Three times a day (TID) | ORAL | 1 refills | Status: DC | PRN
  Filled 2023-03-24: qty 30, 10d supply, fill #0

## 2023-03-24 MED ORDER — ALLOPURINOL 300 MG PO TABS
150.0000 mg | ORAL_TABLET | Freq: Every day | ORAL | 0 refills | Status: DC
Start: 2023-03-24 — End: 2023-08-11
  Filled 2023-03-24: qty 15, 30d supply, fill #0

## 2023-03-24 MED ORDER — VENETOCLAX 10 & 50 & 100 MG PO TBPK: ORAL_TABLET | ORAL | 0 refills | Status: DC

## 2023-03-24 NOTE — Progress Notes (Signed)
START ON PATHWAY REGIMEN - Lymphoma and CLL     Cycle 1: A cycle is 28 days:     Venetoclax      Obinutuzumab      Obinutuzumab      Obinutuzumab    Cycle 2: A cycle is 28 days:     Venetoclax      Venetoclax      Venetoclax      Venetoclax      Obinutuzumab    Cycles 3 through 6: A cycle is 28 days:     Venetoclax      Obinutuzumab    Cycles 7 through 12: A cycle is 28 days:     Venetoclax   **Always confirm dose/schedule in your pharmacy ordering system**  Patient Characteristics: Chronic Lymphocytic Leukemia (CLL), Treatment Indicated, First Line Disease Type: Chronic Lymphocytic Leukemia (CLL) Disease Type: Not Applicable Disease Type: Not Applicable Treatment Indicated<= Treatment Indicated Line of Therapy: First Line Intent of Therapy: Non-Curative / Palliative Intent, Discussed with Patient

## 2023-03-24 NOTE — Telephone Encounter (Signed)
Oral Oncology Pharmacist Encounter  Received new prescription for Venclexta (venetoclax) for the treatment of CLL in conjunction with obinutuzumab, planned duration until disease progression or unacceptable toxicity.  Labs from 02/17/23 (CBC, CMP) assessed, no interventions needed. Patient will receive new labs including uric acid prior to starting venetoclax. Prescription dose and frequency assessed for appropriateness.  Current medication list in Epic reviewed, DDIs with Venclexta identified: - empagliflozin and insulin (cat C): venclexta may decrease the therapeutic effects of diabetic medications. Will notify patient and MD to monitor patients blood sugars while on medication.  Evaluated chart and no patient barriers to medication adherence noted.   Patient agreement for treatment documented in MD note on 03/17/2023.  Prescription has been e-scribed to the [facility] for benefits analysis and approval.  Oral Oncology Clinic will continue to follow for insurance authorization, copayment issues, initial counseling and start date.  Bethel Born, PharmD Hematology/Oncology Clinical Pharmacist [facility] Oral Chemotherapy Navigation Clinic [phone] 03/24/2023 8:42 AM

## 2023-03-25 ENCOUNTER — Telehealth: Payer: Self-pay | Admitting: Hematology

## 2023-03-25 ENCOUNTER — Other Ambulatory Visit: Payer: Self-pay

## 2023-03-25 NOTE — Telephone Encounter (Signed)
Spoke with patient confirming upcoming appointment

## 2023-03-29 ENCOUNTER — Other Ambulatory Visit: Payer: Self-pay | Admitting: *Deleted

## 2023-03-29 NOTE — Progress Notes (Signed)
Pharmacist Chemotherapy Monitoring - Initial Assessment    Anticipated start date: 04/05/23   The following has been reviewed per standard work regarding the patient's treatment regimen: The patient's diagnosis, treatment plan and drug doses, and organ/hematologic function Lab orders and baseline tests specific to treatment regimen  Hepatitis B core Ab, surface Ab, and surface Ag all negative from 2017. The treatment plan start date, drug sequencing, and pre-medications Prior authorization status  Patient's documented medication list, including drug-drug interaction screen and prescriptions for anti-emetics and supportive care specific to the treatment regimen The drug concentrations, fluid compatibility, administration routes, and timing of the medications to be used The patient's access for treatment and lifetime cumulative dose history, if applicable  The patient's medication allergies and previous infusion related reactions, if applicable   Changes made to treatment plan:  N/A  Follow up needed:  N/A   Drusilla Kanner, PharmD, MBA

## 2023-03-30 NOTE — Telephone Encounter (Signed)
Called pt to see if he needed Education appt tomorrow since he has had treatment in the past.  We discussed side effects of Obinutuzumab & informed that pharmacist would be in contact with him to discuss Veneteclex.  Informed that his treatment would be 2 days & only get a portion of obinutuzxumab 1st day & second day would get full dose. Pt was in agreement to not come tomorrow for education & will be back on 04/05/23 for treatment & see Dr Candise Che.

## 2023-03-31 ENCOUNTER — Inpatient Hospital Stay: Payer: No Typology Code available for payment source

## 2023-03-31 ENCOUNTER — Inpatient Hospital Stay: Payer: No Typology Code available for payment source | Admitting: Hematology

## 2023-04-01 ENCOUNTER — Ambulatory Visit: Payer: No Typology Code available for payment source

## 2023-04-02 MED FILL — Dexamethasone Sodium Phosphate Inj 100 MG/10ML: INTRAMUSCULAR | Qty: 2 | Status: AC

## 2023-04-05 ENCOUNTER — Inpatient Hospital Stay: Payer: No Typology Code available for payment source

## 2023-04-05 ENCOUNTER — Encounter: Payer: Self-pay | Admitting: Hematology

## 2023-04-05 ENCOUNTER — Inpatient Hospital Stay (HOSPITAL_BASED_OUTPATIENT_CLINIC_OR_DEPARTMENT_OTHER): Payer: No Typology Code available for payment source | Admitting: Hematology

## 2023-04-05 VITALS — BP 101/50 | HR 95 | Temp 98.3°F | Resp 17 | Wt 143.0 lb

## 2023-04-05 DIAGNOSIS — D696 Thrombocytopenia, unspecified: Secondary | ICD-10-CM | POA: Diagnosis not present

## 2023-04-05 DIAGNOSIS — C9110 Chronic lymphocytic leukemia of B-cell type not having achieved remission: Secondary | ICD-10-CM

## 2023-04-05 DIAGNOSIS — Z5112 Encounter for antineoplastic immunotherapy: Secondary | ICD-10-CM | POA: Diagnosis not present

## 2023-04-05 LAB — CBC WITH DIFFERENTIAL (CANCER CENTER ONLY)
Abs Immature Granulocytes: 0.1 10*3/uL — ABNORMAL HIGH (ref 0.00–0.07)
Basophils Absolute: 0.1 10*3/uL (ref 0.0–0.1)
Basophils Relative: 0 %
Eosinophils Absolute: 0.1 10*3/uL (ref 0.0–0.5)
Eosinophils Relative: 0 %
HCT: 35.4 % — ABNORMAL LOW (ref 39.0–52.0)
Hemoglobin: 11.6 g/dL — ABNORMAL LOW (ref 13.0–17.0)
Immature Granulocytes: 0 %
Lymphocytes Relative: 89 %
Lymphs Abs: 49.5 10*3/uL — ABNORMAL HIGH (ref 0.7–4.0)
MCH: 31.7 pg (ref 26.0–34.0)
MCHC: 32.8 g/dL (ref 30.0–36.0)
MCV: 96.7 fL (ref 80.0–100.0)
Monocytes Absolute: 3.4 10*3/uL — ABNORMAL HIGH (ref 0.1–1.0)
Monocytes Relative: 6 %
Neutro Abs: 3 10*3/uL (ref 1.7–7.7)
Neutrophils Relative %: 5 %
Platelet Count: 110 10*3/uL — ABNORMAL LOW (ref 150–400)
RBC: 3.66 MIL/uL — ABNORMAL LOW (ref 4.22–5.81)
RDW: 15 % (ref 11.5–15.5)
WBC Count: 56.1 10*3/uL (ref 4.0–10.5)
nRBC: 0 % (ref 0.0–0.2)

## 2023-04-05 LAB — CMP (CANCER CENTER ONLY)
ALT: 10 U/L (ref 0–44)
AST: 17 U/L (ref 15–41)
Albumin: 4.5 g/dL (ref 3.5–5.0)
Alkaline Phosphatase: 100 U/L (ref 38–126)
Anion gap: 7 (ref 5–15)
BUN: 24 mg/dL — ABNORMAL HIGH (ref 8–23)
CO2: 27 mmol/L (ref 22–32)
Calcium: 9.5 mg/dL (ref 8.9–10.3)
Chloride: 108 mmol/L (ref 98–111)
Creatinine: 1.25 mg/dL — ABNORMAL HIGH (ref 0.61–1.24)
GFR, Estimated: >60 mL/min (ref >60)
Glucose, Bld: 91 mg/dL (ref 70–99)
Potassium: 4.3 mmol/L (ref 3.5–5.1)
Sodium: 142 mmol/L (ref 135–145)
Total Bilirubin: 0.9 mg/dL (ref 0.0–1.2)
Total Protein: 6.9 g/dL (ref 6.5–8.1)

## 2023-04-05 LAB — LACTATE DEHYDROGENASE: LDH: 163 U/L (ref 98–192)

## 2023-04-05 LAB — HEPATITIS B SURFACE ANTIGEN: Hepatitis B Surface Ag: NONREACTIVE

## 2023-04-05 LAB — URIC ACID: Uric Acid, Serum: 6.4 mg/dL (ref 3.7–8.6)

## 2023-04-05 MED ORDER — SODIUM CHLORIDE 0.9 % IV SOLN
20.0000 mg | Freq: Once | INTRAVENOUS | Status: AC
  Administered 2023-04-05: 20 mg via INTRAVENOUS
  Filled 2023-04-05: qty 20

## 2023-04-05 MED ORDER — MONTELUKAST SODIUM 10 MG PO TABS
10.0000 mg | ORAL_TABLET | Freq: Once | ORAL | Status: AC
  Administered 2023-04-05: 10 mg via ORAL
  Filled 2023-04-05: qty 1

## 2023-04-05 MED ORDER — ACETAMINOPHEN 500 MG PO TABS
1000.0000 mg | ORAL_TABLET | Freq: Once | ORAL | Status: AC
Start: 2023-04-05 — End: 2023-04-05
  Administered 2023-04-05: 1000 mg via ORAL
  Filled 2023-04-05: qty 2

## 2023-04-05 MED ORDER — FAMOTIDINE IN NACL 20-0.9 MG/50ML-% IV SOLN
20.0000 mg | Freq: Once | INTRAVENOUS | Status: AC
  Administered 2023-04-05: 20 mg via INTRAVENOUS
  Filled 2023-04-05: qty 50

## 2023-04-05 MED ORDER — SODIUM CHLORIDE 0.9 % IV SOLN: INTRAVENOUS | Status: DC

## 2023-04-05 MED ORDER — DIPHENHYDRAMINE HCL 50 MG/ML IJ SOLN
50.0000 mg | Freq: Once | INTRAMUSCULAR | Status: AC
  Administered 2023-04-05: 50 mg via INTRAVENOUS
  Filled 2023-04-05: qty 1

## 2023-04-05 MED ORDER — SODIUM CHLORIDE 0.9 % IV SOLN
100.0000 mg | Freq: Once | INTRAVENOUS | Status: AC
  Administered 2023-04-05: 100 mg via INTRAVENOUS
  Filled 2023-04-05: qty 4

## 2023-04-05 MED FILL — Dexamethasone Sodium Phosphate Inj 100 MG/10ML: INTRAMUSCULAR | Qty: 2 | Status: AC

## 2023-04-05 NOTE — Progress Notes (Signed)
 Pt ok for tx today. Dr Candise Che aware: WBC: 56.1, CR: 1.25, Plts : 110

## 2023-04-05 NOTE — Patient Instructions (Signed)
 [facility] - A DEPT OF [facility]  Discharge Instructions: Thank you for choosing [facility] to provide your oncology and hematology care.   If you have a lab appointment with the Cancer Center, please go directly to the Cancer Center and check in at the registration area.   Wear comfortable clothing and clothing appropriate for easy access to any Portacath or PICC line.   We strive to give you quality time with your provider. You may need to reschedule your appointment if you arrive late (15 or more minutes).  Arriving late affects you and other patients whose appointments are after yours.  Also, if you miss three or more appointments without notifying the office, you may be dismissed from the clinic at the provider's discretion.      For prescription refill requests, have your pharmacy contact our office and allow 72 hours for refills to be completed.    Today you received the following chemotherapy and/or immunotherapy agents Gazyva   To help prevent nausea and vomiting after your treatment, we encourage you to take your nausea medication as directed.  BELOW ARE SYMPTOMS THAT SHOULD BE REPORTED IMMEDIATELY: *FEVER GREATER THAN 100.4 F (38 C) OR HIGHER *CHILLS OR SWEATING *NAUSEA AND VOMITING THAT IS NOT CONTROLLED WITH YOUR NAUSEA MEDICATION *UNUSUAL SHORTNESS OF BREATH *UNUSUAL BRUISING OR BLEEDING *URINARY PROBLEMS (pain or burning when urinating, or frequent urination) *BOWEL PROBLEMS (unusual diarrhea, constipation, pain near the anus) TENDERNESS IN MOUTH AND THROAT WITH OR WITHOUT PRESENCE OF ULCERS (sore throat, sores in mouth, or a toothache) UNUSUAL RASH, SWELLING OR PAIN  UNUSUAL VAGINAL DISCHARGE OR ITCHING   Items with * indicate a potential emergency and should be followed up as soon as possible or go to the Emergency Department if any problems should occur.  Please show the CHEMOTHERAPY ALERT CARD or IMMUNOTHERAPY ALERT  CARD at check-in to the Emergency Department and triage nurse.  Should you have questions after your visit or need to cancel or reschedule your appointment, please contact [facility] - A DEPT OF [facility]  Dept: [phone]  and follow the prompts.  Office hours are 8:00 a.m. to 4:30 p.m. Monday - Friday. Please note that voicemails left after 4:00 p.m. may not be returned until the following business day.  We are closed weekends and major holidays. You have access to a nurse at all times for urgent questions. Please call the main number to the clinic Dept: [phone] and follow the prompts.   For any non-urgent questions, you may also contact your provider using MyChart. We now offer e-Visits for anyone 76 and older to request care online for non-urgent symptoms. For details visit [email or web address].   Also download the MyChart app! Go to the app store, search "MyChart", open the app, select [place], and log in with your MyChart username and password.

## 2023-04-05 NOTE — Progress Notes (Signed)
 HEMATOLOGY/ONCOLOGY CLINIC NOTE  Date of Service: 04/05/2023  Patient Care Team: Tyler Fret, MD as PCP - General (Family Medicine) Tyler Halon, MD (Inactive)  CHIEF COMPLAINTS/PURPOSE OF CONSULTATION:  Evaluation and management of CLL  DIAGNOSIS: 68 year old man with CLL diagnosed in 2012 after presenting with lymphocytosis and adenopathy and stage IIIA disease.   Prior therapy:    Bendamustine and rituximab started in July 2017 and received total of 2 cycles. Therapy discontinued in August 2017 based on patient's wishes after achieving partial response.   He developed a relapse disease in April 2019. Ibrutinib 420 mg daily started in April 2019 and discontinued in September 2023 after poor adherence.   CURRENT THERAPY: Calquence 100 mg twice daily started in September 2023.  HISTORY OF PRESENTING ILLNESS:  Tyler Scott is a wonderful 68 y.o. male who has been a previous patient of Dr. Clelia Croft. He is here for evaluation and management of CLL.   Patient was last seen by Dr. Clelia Croft on 02/18/2022 and was doing well overall with no new medical concerns.  Today, he reports that he has been on and off different treatment since 2017, but has been on Calquence since September, 2023. He does admit that he does forget to take this medication a couple times a week. He reports that his previous treatment of Ibrutinib did cause him diarrhea.  He did receive two previous injection in his buttocks, but is unsure it is was steroids or antibiotics. Patient is UTD with influenza, RSV, and COVID-19 vaccinations. He denies any abdominal pain, but does note some discomfort along the spleen. He denies any bleeding issues, chest pain, diarrhea, resolved lumps/bumps in neck, infection issues, fevers, or chills.  He does note some SOB related to allergies and chest/sinus congestion. His symptoms are improving. He also reports that his neck edema had improved. He does complain of  sleep deprivation/poor sleep habits and notes some stress. He does typically sleep during the day.  He does take vitamin D and C supplements regularly. On average, he does drink 1 glass of wine a day and denies any major medication changes.  INTERVAL HISTORY:  Tyler Scott is a wonderful 68 y.o. male here for continued evaluation and management of CLL. Patient is here for cycle 1 day 1 of his treatment during this visit.   Patient was last seen by me on 03/17/2023 and he complained of night sweats, mild weight loss, fluctuating bowel habits between diarrhea and constipation, and mild fatigue.   Patient notes he has been doing well overall since our last visit. He denies any new infection issues, fever, chills, unexpected weight loss, back pain, chest pain, abdominal pain, or leg swelling. He does complain of night sweats.    MEDICAL HISTORY:  Past Medical History:  Diagnosis Date   Allergy    Arthritis    CLL (chronic lymphocytic leukemia) (HCC)    stage III: asymptomatic; on observation   DIABETES MELLITUS, TYPE I, UNCONTROLLED 12/30/2006   insulin   Diabetic retinopathy    ERECTILE DYSFUNCTION, ORGANIC 12/30/2006   Hx of adenomatous polyp of colon 05/24/2014   HYPERLIPIDEMIA 12/30/2006   HYPERTENSION 12/30/2006   LOW BACK PAIN, ACUTE 12/21/2006    SURGICAL HISTORY: Past Surgical History:  Procedure Laterality Date   COLONOSCOPY     great right toe- Fused the I P joint      SOCIAL HISTORY: Social History   Socioeconomic History   Marital status: Married    Spouse name:  Not on file   Number of children: Not on file   Years of education: Not on file   Highest education level: Not on file  Occupational History   Not on file  Tobacco Use   Smoking status: Never   Smokeless tobacco: Never  Vaping Use   Vaping status: Never Used  Substance and Sexual Activity   Alcohol use: Yes    Alcohol/week: 4.0 standard drinks of alcohol    Types: 4 Glasses of wine per  week   Drug use: No   Sexual activity: Not on file    Comment: regular exercise - yes  Other Topics Concern   Not on file  Social History Narrative   Not on file   Social Drivers of Health   Financial Resource Strain: Not on file  Food Insecurity: Not on file  Transportation Needs: Not on file  Physical Activity: Not on file  Stress: Not on file  Social Connections: Unknown (06/12/2021)   Received from [facility], [facility]   Social Network    Social Network: Not on file  Intimate Partner Violence: Unknown (05/16/2021)   Received from [facility], [facility]   HITS    Physically Hurt: Not on file    Insult or Talk Down To: Not on file    Threaten Physical Harm: Not on file    Scream or Curse: Not on file    FAMILY HISTORY: Family History  Problem Relation Age of Onset   Alcohol abuse Other    Colon cancer Brother 62       Died 10-12-13   Diabetes Mellitus II Mother    Esophageal cancer Neg Hx    Rectal cancer Neg Hx    Stomach cancer Neg Hx     ALLERGIES:  has no known allergies.  MEDICATIONS:  Current Outpatient Medications  Medication Sig Dispense Refill   acalabrutinib maleate (CALQUENCE) 100 MG tablet Take 1 tablet (100 mg total) by mouth 2 (two) times daily. 60 tablet 3   acyclovir (ZOVIRAX) 400 MG tablet Take 1 tablet (400 mg total) by mouth daily. 30 tablet 5   allopurinol (ZYLOPRIM) 300 MG tablet Take 0.5 tablets (150 mg total) by mouth daily. 30 tablet 0   Blood Glucose Monitoring Suppl (ONE TOUCH ULTRA 2) W/DEVICE KIT See admin instructions.  0   Cholecalciferol 50 MCG (2000 UT) TABS TAKE ONE TABLET BY MOUTH DAILY FOR LOW VITAMIN D.     doxepin (SINEQUAN) 25 MG capsule Take 1 capsule by mouth at bedtime.     empagliflozin (JARDIANCE) 25 MG TABS tablet Take 0.5 tablets by mouth every morning. Only if blood sugars get too high     finasteride (PROPECIA) 1 MG tablet Take 1 tablet by mouth daily.     finasteride (PROSCAR) 5 MG tablet Take 0.5 tablets  by mouth daily.     Fluocin-Hydroquinone-Tretinoin (TRI-LUMA) 0.01-4-0.05 % CREA Apply 1 application. topically at bedtime. (Patient not taking: Reported on 12/11/2021) 30 g 2   Insulin Glargine (LANTUS SOLOSTAR) 100 UNIT/ML Solostar Pen INJECT 20 UNITS SUBCUTANEOUSLY AT BEDTIME 3 pen 11   INSULIN GLARGINE, 1 UNIT DIAL,  INJECT 20 UNITS SUBCUTANEOUSLY DAILY (Patient not taking: Reported on 12/11/2021)     insulin glargine-yfgn (SEMGLEE) 100 UNIT/ML Pen INJECT 22 UNITS SUBCUTANEOUSLY AT BEDTIME (USE WITHIN 28 DAYS AFTER OPENING PEN) (CONVERTED FROM LANTUS)     lisinopril (PRINIVIL,ZESTRIL) 40 MG tablet Take 1 tablet (40 mg total) by mouth daily. 90 tablet 4  meloxicam (MOBIC) 7.5 MG tablet TAKE 1 TABLET (7.5 MG TOTAL) BY MOUTH DAILY. (Patient taking differently: TAKE 1 TABLET (7.5 MG TOTAL) BY MOUTH DAILY. As needed) 30 tablet 1   ondansetron (ZOFRAN) 8 MG tablet Take 1 tablet (8 mg total) by mouth every 8 (eight) hours as needed for nausea or vomiting. 30 tablet 1   ONE TOUCH ULTRA TEST test strip TEST GLUCOSE 3 TIMES A DAY 300 each 1   ONETOUCH DELICA LANCETS 33G MISC TEST GLUCOSE 3 TIMES A DAY 300 each 1   prochlorperazine (COMPAZINE) 10 MG tablet Take 1 tablet (10 mg total) by mouth every 6 (six) hours as needed for nausea or vomiting. 30 tablet 1   sildenafil (REVATIO) 20 MG tablet Take 1 to 2 tabs 2 - 3 hours before sex 30 tablet 11   simvastatin (ZOCOR) 20 MG tablet TAKE 1 TABLET (20 MG TOTAL) BY MOUTH AT BEDTIME. 100 tablet 4   venetoclax (VENCLEXTA) 10 & 50 & 100 MG Starter Pack Take by mouth daily. Take 20 mg for 7 days, then 50 mg daily x 7d, then 100 mg daily x 7d, then 200 mg daily x 7d. Take with food & water. 42 tablet 0   No current facility-administered medications for this visit.   Facility-Administered Medications Ordered in Other Visits  Medication Dose Route Frequency Provider Last Rate Last Admin   0.9 %  sodium chloride infusion   Intravenous Continuous Candise Che, Corene Cornea,  MD       acetaminophen (TYLENOL) tablet 1,000 mg  1,000 mg Oral Once Johney Maine, MD       dexamethasone (DECADRON) 20 mg in sodium chloride 0.9 % 50 mL IVPB  20 mg Intravenous Once Johney Maine, MD       diphenhydrAMINE (BENADRYL) injection 50 mg  50 mg Intravenous Once Johney Maine, MD       famotidine (PEPCID) IVPB 20 mg premix  20 mg Intravenous Once Johney Maine, MD       montelukast (SINGULAIR) tablet 10 mg  10 mg Oral Once Johney Maine, MD       obinutuzumab (GAZYVA) 100 mg in sodium chloride 0.9 % 100 mL (0.9615 mg/mL) chemo infusion  100 mg Intravenous Once Johney Maine, MD        REVIEW OF SYSTEMS:    10 Point review of Systems was done is negative except as noted above.   PHYSICAL EXAMINATION: ECOG PERFORMANCE STATUS: 1 - Symptomatic but completely ambulatory . VSS GENERAL:alert, in no acute distress and comfortable SKIN: no acute rashes, no significant lesions EYES: conjunctiva are pink and non-injected, sclera anicteric OROPHARYNX: MMM, no exudates, no oropharyngeal erythema or ulceration NECK: supple, no JVD LYMPH:  no palpable lymphadenopathy in the cervical, axillary or inguinal regions LUNGS: clear to auscultation b/l with normal respiratory effort HEART: regular rate & rhythm ABDOMEN:  normoactive bowel sounds , non tender, not distended. Extremity: no pedal edema PSYCH: alert & oriented x 3 with fluent speech NEURO: no focal motor/sensory deficits   LABORATORY DATA:  I have reviewed the data as listed .    Latest Ref Rng & Units 04/05/2023    7:52 AM 02/17/2023    9:46 AM 10/06/2022   12:09 PM  CBC  WBC 4.0 - 10.5 K/uL 56.1  102.1  72.3   Hemoglobin 13.0 - 17.0 g/dL 09.8  11.9  14.7   Hematocrit 39.0 - 52.0 % 35.4  41.7  50.2   Platelets 150 -  400 K/uL 110  142  154    .CMP     Component Value Date/Time   NA 142 04/05/2023 0752   NA 142 01/01/2017 0809   K 4.3 04/05/2023 0752   K 4.4 01/01/2017 0809    CL 108 04/05/2023 0752   CL 102 04/25/2012 1003   CO2 27 04/05/2023 0752   CO2 26 01/01/2017 0809   GLUCOSE 91 04/05/2023 0752   GLUCOSE 89 01/01/2017 0809   GLUCOSE 170 (H) 04/25/2012 1003   BUN 24 (H) 04/05/2023 0752   BUN 15.7 01/01/2017 0809   CREATININE 1.25 (H) 04/05/2023 0752   CREATININE 0.9 01/01/2017 0809   CALCIUM 9.5 04/05/2023 0752   CALCIUM 9.1 01/01/2017 0809   PROT 6.9 04/05/2023 0752   PROT 7.0 01/01/2017 0809   ALBUMIN 4.5 04/05/2023 0752   ALBUMIN 4.2 01/01/2017 0809   AST 17 04/05/2023 0752   AST 22 01/01/2017 0809   ALT 10 04/05/2023 0752   ALT 19 01/01/2017 0809   ALKPHOS 100 04/05/2023 0752   ALKPHOS 68 01/01/2017 0809   BILITOT 0.9 04/05/2023 0752   BILITOT 0.75 01/01/2017 0809   GFRNONAA >60 04/05/2023 0752   GFRAA >60 07/25/2019 0932   .    Latest Ref Rng & Units 04/05/2023    7:52 AM 02/17/2023    9:46 AM 10/06/2022   12:09 PM  CMP  Glucose 70 - 99 mg/dL 91  54  161   BUN 8 - 23 mg/dL 24  28  23    Creatinine 0.61 - 1.24 mg/dL 0.96  0.45  4.09   Sodium 135 - 145 mmol/L 142  140  140   Potassium 3.5 - 5.1 mmol/L 4.3  4.5  4.7   Chloride 98 - 111 mmol/L 108  107  107   CO2 22 - 32 mmol/L 27  26  27    Calcium 8.9 - 10.3 mg/dL 9.5  9.7  9.4   Total Protein 6.5 - 8.1 g/dL 6.9  7.0  7.0   Total Bilirubin 0.0 - 1.2 mg/dL 0.9  1.3  1.2   Alkaline Phos 38 - 126 U/L 100  82  71   AST 15 - 41 U/L 17  18  16    ALT 0 - 44 U/L 10  10  10     . RADIOGRAPHIC STUDIES: I have personally reviewed the radiological images as listed and agreed with the findings in the report. No results found.   ASSESSMENT & PLAN:   68 year old man with:      1.  CLL /SLL diagnosed in 2012 after presenting with stage IIIA, leukocytosis and lymphadenopathy.  He is currently on Calquence with continued response in his white cell count continues to drop appropriately. His hemoglobin and platelet count has also nearly normalized at this time.   2.  Lymphadenopathy: Related to  his lymphoproliferative disorder with cervical adenopathy.  This has nearly resolved at this time.   3.  Tumor lysis syndrome: He is at low risk at this time despite his bulky disease and will continue to monitor his electrolytes for the time being.  PLAN: -Discussed lab results from today, 04/05/2023, in detail with the patient. CBC shows elevated but improved WBC of 56.1 K, mild anemia with Hgb of 11.6 g/dL with Hct of 81.1%, and low platelets of 110 K. CMP shows slightly elevated BUN of 24 with creatinine of 1.25, but stable overall.  -Patient can proceed with cycle 1 day 1 of his GAZYVA plus  Venetoclax treatment today without any dose modifications. He appears to tolerating his treatment today well. -venetoclax to start from C2 or C3 of treatment -answered all of patient's questions in detail  FOLLOW-UP: -per integrated scheduling  The total time spent in the appointment was 30 minutes* .  All of the patient's questions were answered with apparent satisfaction. The patient knows to call the clinic with any problems, questions or concerns.   Wyvonnia Lora MD MS AAHIVMS [facility] Hematology/Oncology Physician [facility]  .*Total Encounter Time as defined by the Centers for Medicare and Medicaid Services includes, in addition to the face-to-face time of a patient visit (documented in the note above) non-face-to-face time: obtaining and reviewing outside history, ordering and reviewing medications, tests or procedures, care coordination (communications with other health care professionals or caregivers) and documentation in the medical record.   I,Param Shah,acting as a Neurosurgeon for Wyvonnia Lora, MD.,have documented all relevant documentation on the behalf of Wyvonnia Lora, MD,as directed by  Wyvonnia Lora, MD while in the presence of Wyvonnia Lora, MD.  .I have reviewed the above documentation for accuracy and completeness, and I agree with the above. Johney Maine MD

## 2023-04-06 ENCOUNTER — Inpatient Hospital Stay: Payer: No Typology Code available for payment source

## 2023-04-06 ENCOUNTER — Telehealth: Payer: Self-pay | Admitting: Hematology

## 2023-04-06 VITALS — BP 154/69 | HR 78 | Temp 97.8°F | Resp 16

## 2023-04-06 DIAGNOSIS — Z5112 Encounter for antineoplastic immunotherapy: Secondary | ICD-10-CM | POA: Diagnosis not present

## 2023-04-06 DIAGNOSIS — C9110 Chronic lymphocytic leukemia of B-cell type not having achieved remission: Secondary | ICD-10-CM

## 2023-04-06 LAB — HEPATITIS B CORE ANTIBODY, TOTAL: HEP B CORE AB: NEGATIVE

## 2023-04-06 MED ORDER — SODIUM CHLORIDE 0.9% FLUSH: 10.0000 mL | INTRAVENOUS | Status: DC | PRN

## 2023-04-06 MED ORDER — MONTELUKAST SODIUM 10 MG PO TABS
10.0000 mg | ORAL_TABLET | Freq: Once | ORAL | Status: AC
  Administered 2023-04-06: 10 mg via ORAL
  Filled 2023-04-06: qty 1

## 2023-04-06 MED ORDER — HEPARIN SOD (PORK) LOCK FLUSH 100 UNIT/ML IV SOLN: 500.0000 [IU] | Freq: Once | INTRAVENOUS | Status: DC | PRN

## 2023-04-06 MED ORDER — DIPHENHYDRAMINE HCL 50 MG/ML IJ SOLN
50.0000 mg | Freq: Once | INTRAMUSCULAR | Status: AC
  Administered 2023-04-06: 50 mg via INTRAVENOUS
  Filled 2023-04-06: qty 1

## 2023-04-06 MED ORDER — SODIUM CHLORIDE 0.9 % IV SOLN
900.0000 mg | Freq: Once | INTRAVENOUS | Status: AC
  Administered 2023-04-06: 900 mg via INTRAVENOUS
  Filled 2023-04-06: qty 36

## 2023-04-06 MED ORDER — SODIUM CHLORIDE 0.9 % IV SOLN
20.0000 mg | Freq: Once | INTRAVENOUS | Status: AC
  Administered 2023-04-06: 20 mg via INTRAVENOUS
  Filled 2023-04-06: qty 20

## 2023-04-06 MED ORDER — ACETAMINOPHEN 500 MG PO TABS
1000.0000 mg | ORAL_TABLET | Freq: Once | ORAL | Status: AC
  Administered 2023-04-06: 1000 mg via ORAL
  Filled 2023-04-06: qty 2

## 2023-04-06 MED ORDER — FAMOTIDINE IN NACL 20-0.9 MG/50ML-% IV SOLN
20.0000 mg | Freq: Once | INTRAVENOUS | Status: AC
  Administered 2023-04-06: 20 mg via INTRAVENOUS
  Filled 2023-04-06: qty 50

## 2023-04-06 MED ORDER — SODIUM CHLORIDE 0.9 % IV SOLN: INTRAVENOUS | Status: DC

## 2023-04-06 NOTE — Telephone Encounter (Signed)
 Left patient a vm regarding upcoming appointment

## 2023-04-06 NOTE — Patient Instructions (Signed)
 [facility] - A DEPT OF [facility]  Discharge Instructions: Thank you for choosing [facility] to provide your oncology and hematology care.   If you have a lab appointment with the Cancer Center, please go directly to the Cancer Center and check in at the registration area.   Wear comfortable clothing and clothing appropriate for easy access to any Portacath or PICC line.   We strive to give you quality time with your provider. You may need to reschedule your appointment if you arrive late (15 or more minutes).  Arriving late affects you and other patients whose appointments are after yours.  Also, if you miss three or more appointments without notifying the office, you may be dismissed from the clinic at the provider's discretion.      For prescription refill requests, have your pharmacy contact our office and allow 72 hours for refills to be completed.    Today you received the following chemotherapy and/or immunotherapy agents Gazyva   To help prevent nausea and vomiting after your treatment, we encourage you to take your nausea medication as directed.  BELOW ARE SYMPTOMS THAT SHOULD BE REPORTED IMMEDIATELY: *FEVER GREATER THAN 100.4 F (38 C) OR HIGHER *CHILLS OR SWEATING *NAUSEA AND VOMITING THAT IS NOT CONTROLLED WITH YOUR NAUSEA MEDICATION *UNUSUAL SHORTNESS OF BREATH *UNUSUAL BRUISING OR BLEEDING *URINARY PROBLEMS (pain or burning when urinating, or frequent urination) *BOWEL PROBLEMS (unusual diarrhea, constipation, pain near the anus) TENDERNESS IN MOUTH AND THROAT WITH OR WITHOUT PRESENCE OF ULCERS (sore throat, sores in mouth, or a toothache) UNUSUAL RASH, SWELLING OR PAIN  UNUSUAL VAGINAL DISCHARGE OR ITCHING   Items with * indicate a potential emergency and should be followed up as soon as possible or go to the Emergency Department if any problems should occur.  Please show the CHEMOTHERAPY ALERT CARD or IMMUNOTHERAPY ALERT  CARD at check-in to the Emergency Department and triage nurse.  Should you have questions after your visit or need to cancel or reschedule your appointment, please contact [facility] - A DEPT OF [facility]  Dept: [phone]  and follow the prompts.  Office hours are 8:00 a.m. to 4:30 p.m. Monday - Friday. Please note that voicemails left after 4:00 p.m. may not be returned until the following business day.  We are closed weekends and major holidays. You have access to a nurse at all times for urgent questions. Please call the main number to the clinic Dept: [phone] and follow the prompts.   For any non-urgent questions, you may also contact your provider using MyChart. We now offer e-Visits for anyone 76 and older to request care online for non-urgent symptoms. For details visit [email or web address].   Also download the MyChart app! Go to the app store, search "MyChart", open the app, select [place], and log in with your MyChart username and password.

## 2023-04-11 ENCOUNTER — Encounter: Payer: Self-pay | Admitting: Hematology

## 2023-04-12 ENCOUNTER — Inpatient Hospital Stay: Payer: No Typology Code available for payment source | Admitting: Hematology

## 2023-04-12 ENCOUNTER — Telehealth: Payer: Self-pay

## 2023-04-12 ENCOUNTER — Inpatient Hospital Stay: Payer: No Typology Code available for payment source

## 2023-04-12 ENCOUNTER — Encounter: Payer: Self-pay | Admitting: Hematology

## 2023-04-12 MED FILL — Dexamethasone Sodium Phosphate Inj 100 MG/10ML: INTRAMUSCULAR | Qty: 2 | Status: AC

## 2023-04-12 NOTE — Telephone Encounter (Signed)
 Telephone encounter closed by RN after adding in appointment information. Will have to re-open encounter for counseling note.

## 2023-04-13 ENCOUNTER — Inpatient Hospital Stay: Payer: No Typology Code available for payment source

## 2023-04-13 ENCOUNTER — Other Ambulatory Visit

## 2023-04-19 MED FILL — Dexamethasone Sodium Phosphate Inj 100 MG/10ML: INTRAMUSCULAR | Qty: 2 | Status: AC

## 2023-04-20 ENCOUNTER — Inpatient Hospital Stay: Payer: No Typology Code available for payment source | Attending: Hematology

## 2023-04-20 ENCOUNTER — Inpatient Hospital Stay: Payer: No Typology Code available for payment source

## 2023-04-20 ENCOUNTER — Inpatient Hospital Stay: Payer: No Typology Code available for payment source | Admitting: Physician Assistant

## 2023-04-20 VITALS — BP 126/62 | HR 93 | Temp 97.7°F | Resp 16 | Wt 147.0 lb

## 2023-04-20 VITALS — BP 140/69 | HR 86 | Temp 98.3°F | Resp 16

## 2023-04-20 DIAGNOSIS — C9110 Chronic lymphocytic leukemia of B-cell type not having achieved remission: Secondary | ICD-10-CM

## 2023-04-20 DIAGNOSIS — Z5112 Encounter for antineoplastic immunotherapy: Secondary | ICD-10-CM | POA: Insufficient documentation

## 2023-04-20 LAB — CBC WITH DIFFERENTIAL (CANCER CENTER ONLY)
Abs Immature Granulocytes: 0.02 10*3/uL (ref 0.00–0.07)
Basophils Absolute: 0 10*3/uL (ref 0.0–0.1)
Basophils Relative: 1 %
Eosinophils Absolute: 0 10*3/uL (ref 0.0–0.5)
Eosinophils Relative: 0 %
HCT: 32.1 % — ABNORMAL LOW (ref 39.0–52.0)
Hemoglobin: 10.7 g/dL — ABNORMAL LOW (ref 13.0–17.0)
Immature Granulocytes: 0 %
Lymphocytes Relative: 64 %
Lymphs Abs: 3.9 10*3/uL (ref 0.7–4.0)
MCH: 31.8 pg (ref 26.0–34.0)
MCHC: 33.3 g/dL (ref 30.0–36.0)
MCV: 95.3 fL (ref 80.0–100.0)
Monocytes Absolute: 0.3 10*3/uL (ref 0.1–1.0)
Monocytes Relative: 4 %
Neutro Abs: 1.9 10*3/uL (ref 1.7–7.7)
Neutrophils Relative %: 31 %
Platelet Count: 131 10*3/uL — ABNORMAL LOW (ref 150–400)
RBC: 3.37 MIL/uL — ABNORMAL LOW (ref 4.22–5.81)
RDW: 14.6 % (ref 11.5–15.5)
WBC Count: 6.1 10*3/uL (ref 4.0–10.5)
nRBC: 0 % (ref 0.0–0.2)

## 2023-04-20 LAB — LACTATE DEHYDROGENASE: LDH: 125 U/L (ref 98–192)

## 2023-04-20 LAB — CMP (CANCER CENTER ONLY)
ALT: 10 U/L (ref 0–44)
AST: 15 U/L (ref 15–41)
Albumin: 4.3 g/dL (ref 3.5–5.0)
Alkaline Phosphatase: 80 U/L (ref 38–126)
Anion gap: 7 (ref 5–15)
BUN: 18 mg/dL (ref 8–23)
CO2: 27 mmol/L (ref 22–32)
Calcium: 8.7 mg/dL — ABNORMAL LOW (ref 8.9–10.3)
Chloride: 106 mmol/L (ref 98–111)
Creatinine: 1.1 mg/dL (ref 0.61–1.24)
GFR, Estimated: >60 mL/min (ref >60)
Glucose, Bld: 277 mg/dL — ABNORMAL HIGH (ref 70–99)
Potassium: 4.6 mmol/L (ref 3.5–5.1)
Sodium: 140 mmol/L (ref 135–145)
Total Bilirubin: 0.8 mg/dL (ref 0.0–1.2)
Total Protein: 6.4 g/dL — ABNORMAL LOW (ref 6.5–8.1)

## 2023-04-20 LAB — URIC ACID: Uric Acid, Serum: 5.4 mg/dL (ref 3.7–8.6)

## 2023-04-20 MED ORDER — ACETAMINOPHEN 500 MG PO TABS
1000.0000 mg | ORAL_TABLET | Freq: Once | ORAL | Status: AC
Start: 2023-04-20 — End: 2023-04-20
  Administered 2023-04-20: 1000 mg via ORAL
  Filled 2023-04-20: qty 2

## 2023-04-20 MED ORDER — DIPHENHYDRAMINE HCL 50 MG/ML IJ SOLN
50.0000 mg | Freq: Once | INTRAMUSCULAR | Status: AC
Start: 2023-04-20 — End: 2023-04-20
  Administered 2023-04-20: 50 mg via INTRAVENOUS
  Filled 2023-04-20: qty 1

## 2023-04-20 MED ORDER — SODIUM CHLORIDE 0.9 % IV SOLN: INTRAVENOUS | Status: DC

## 2023-04-20 MED ORDER — FAMOTIDINE IN NACL 20-0.9 MG/50ML-% IV SOLN
20.0000 mg | Freq: Once | INTRAVENOUS | Status: AC
  Administered 2023-04-20: 20 mg via INTRAVENOUS
  Filled 2023-04-20: qty 50

## 2023-04-20 MED ORDER — MONTELUKAST SODIUM 10 MG PO TABS
10.0000 mg | ORAL_TABLET | Freq: Once | ORAL | Status: AC
  Administered 2023-04-20: 10 mg via ORAL
  Filled 2023-04-20: qty 1

## 2023-04-20 MED ORDER — SODIUM CHLORIDE 0.9 % IV SOLN
20.0000 mg | Freq: Once | INTRAVENOUS | Status: AC
  Administered 2023-04-20: 20 mg via INTRAVENOUS
  Filled 2023-04-20: qty 20

## 2023-04-20 MED ORDER — SODIUM CHLORIDE 0.9 % IV SOLN
1000.0000 mg | Freq: Once | INTRAVENOUS | Status: AC
  Administered 2023-04-20: 1000 mg via INTRAVENOUS
  Filled 2023-04-20: qty 40

## 2023-04-20 NOTE — Patient Instructions (Signed)
 [facility] - A DEPT OF [facility]  Discharge Instructions: Thank you for choosing [facility] to provide your oncology and hematology care.   If you have a lab appointment with the Cancer Center, please go directly to the Cancer Center and check in at the registration area.   Wear comfortable clothing and clothing appropriate for easy access to any Portacath or PICC line.   We strive to give you quality time with your provider. You may need to reschedule your appointment if you arrive late (15 or more minutes).  Arriving late affects you and other patients whose appointments are after yours.  Also, if you miss three or more appointments without notifying the office, you may be dismissed from the clinic at the provider's discretion.      For prescription refill requests, have your pharmacy contact our office and allow 72 hours for refills to be completed.    Today you received the following chemotherapy and/or immunotherapy agents Gazyva   To help prevent nausea and vomiting after your treatment, we encourage you to take your nausea medication as directed.  BELOW ARE SYMPTOMS THAT SHOULD BE REPORTED IMMEDIATELY: *FEVER GREATER THAN 100.4 F (38 C) OR HIGHER *CHILLS OR SWEATING *NAUSEA AND VOMITING THAT IS NOT CONTROLLED WITH YOUR NAUSEA MEDICATION *UNUSUAL SHORTNESS OF BREATH *UNUSUAL BRUISING OR BLEEDING *URINARY PROBLEMS (pain or burning when urinating, or frequent urination) *BOWEL PROBLEMS (unusual diarrhea, constipation, pain near the anus) TENDERNESS IN MOUTH AND THROAT WITH OR WITHOUT PRESENCE OF ULCERS (sore throat, sores in mouth, or a toothache) UNUSUAL RASH, SWELLING OR PAIN  UNUSUAL VAGINAL DISCHARGE OR ITCHING   Items with * indicate a potential emergency and should be followed up as soon as possible or go to the Emergency Department if any problems should occur.  Please show the CHEMOTHERAPY ALERT CARD or IMMUNOTHERAPY ALERT  CARD at check-in to the Emergency Department and triage nurse.  Should you have questions after your visit or need to cancel or reschedule your appointment, please contact [facility] - A DEPT OF [facility]  Dept: [phone]  and follow the prompts.  Office hours are 8:00 a.m. to 4:30 p.m. Monday - Friday. Please note that voicemails left after 4:00 p.m. may not be returned until the following business day.  We are closed weekends and major holidays. You have access to a nurse at all times for urgent questions. Please call the main number to the clinic Dept: [phone] and follow the prompts.   For any non-urgent questions, you may also contact your provider using MyChart. We now offer e-Visits for anyone 76 and older to request care online for non-urgent symptoms. For details visit [email or web address].   Also download the MyChart app! Go to the app store, search "MyChart", open the app, select [place], and log in with your MyChart username and password.

## 2023-04-20 NOTE — Progress Notes (Signed)
 HEMATOLOGY/ONCOLOGY CLINIC NOTE  Date of Service: 04/20/2023  Patient Care Team: Anson Fret, MD as PCP - General (Family Medicine) Drema Halon, MD (Inactive)  CHIEF COMPLAINTS/PURPOSE OF CONSULTATION:  Evaluation and management of CLL  DIAGNOSIS: 68 year old man with CLL diagnosed in 2012 after presenting with lymphocytosis and adenopathy and stage IIIA disease.   Prior therapy:  Bendamustine and rituximab started in July 2017 and received total of 2 cycles. Therapy discontinued in August 2017 based on patient's wishes after achieving partial response. He developed a relapse disease in April 2019. Ibrutinib 420 mg daily started in April 2019 and discontinued in September 2023 after poor adherence. Calquence 100 mg twice daily started in September 2023. Due to worsening leukocytosis and increased burden of disease seen on PET scan from 03/05/2023, recommend to switch to [facility] plus Venetoclax. Orlando Penner on 04/05/2023.   INTERVAL HISTORY: Tyler Scott returns for a follow up for continued evaluation and management of CLL. Patient is here for cycle 1 day 8 of Gazyva. He is unaccompanied for this visit.   Mr. Modica reports he has tolerating the first treatment without any significant toxicities. His energy and appetite have improved. He has noticed increased weight gain as a results. He denies nausea, vomiting or abdominal pain. He did experience intermittent episodes of diarrhea that resolved on its own. He denies easy bruising or signs of bleeding. He denies fevers, chills, sweats, shortness of breath, chest pain or cough. He has no other complaints.   MEDICAL HISTORY:  Past Medical History:  Diagnosis Date   Allergy    Arthritis    CLL (chronic lymphocytic leukemia) (HCC)    stage III: asymptomatic; on observation   DIABETES MELLITUS, TYPE I, UNCONTROLLED 12/30/2006   insulin   Diabetic retinopathy    ERECTILE DYSFUNCTION, ORGANIC 12/30/2006   Hx  of adenomatous polyp of colon 05/24/2014   HYPERLIPIDEMIA 12/30/2006   HYPERTENSION 12/30/2006   LOW BACK PAIN, ACUTE 12/21/2006    SURGICAL HISTORY: Past Surgical History:  Procedure Laterality Date   COLONOSCOPY     great right toe- Fused the I P joint      SOCIAL HISTORY: Social History   Socioeconomic History   Marital status: Married    Spouse name: Not on file   Number of children: Not on file   Years of education: Not on file   Highest education level: Not on file  Occupational History   Not on file  Tobacco Use   Smoking status: Never   Smokeless tobacco: Never  Vaping Use   Vaping status: Never Used  Substance and Sexual Activity   Alcohol use: Yes    Alcohol/week: 4.0 standard drinks of alcohol    Types: 4 Glasses of wine per week   Drug use: No   Sexual activity: Not on file    Comment: regular exercise - yes  Other Topics Concern   Not on file  Social History Narrative   Not on file   Social Drivers of Health   Financial Resource Strain: Not on file  Food Insecurity: Not on file  Transportation Needs: Not on file  Physical Activity: Not on file  Stress: Not on file  Social Connections: Unknown (06/12/2021)   Received from [facility], [facility]   Social Network    Social Network: Not on file  Intimate Partner Violence: Unknown (05/16/2021)   Received from [facility], [facility]   HITS    Physically Hurt: Not on file  Insult or Talk Down To: Not on file    Threaten Physical Harm: Not on file    Scream or Curse: Not on file    FAMILY HISTORY: Family History  Problem Relation Age of Onset   Alcohol abuse Other    Colon cancer Brother 63       Died Sep 27, 2013   Diabetes Mellitus II Mother    Esophageal cancer Neg Hx    Rectal cancer Neg Hx    Stomach cancer Neg Hx     ALLERGIES:  has no known allergies.  MEDICATIONS:  Current Outpatient Medications  Medication Sig Dispense Refill   acyclovir (ZOVIRAX) 400 MG tablet Take 1  tablet (400 mg total) by mouth daily. 30 tablet 5   allopurinol (ZYLOPRIM) 300 MG tablet Take 0.5 tablets (150 mg total) by mouth daily. 30 tablet 0   Blood Glucose Monitoring Suppl (ONE TOUCH ULTRA 2) W/DEVICE KIT See admin instructions.  0   Cholecalciferol 50 MCG (2000 UT) TABS TAKE ONE TABLET BY MOUTH DAILY FOR LOW VITAMIN D.     doxepin (SINEQUAN) 25 MG capsule Take 1 capsule by mouth at bedtime.     empagliflozin (JARDIANCE) 25 MG TABS tablet Take 0.5 tablets by mouth every morning. Only if blood sugars get too high     finasteride (PROPECIA) 1 MG tablet Take 1 tablet by mouth daily.     finasteride (PROSCAR) 5 MG tablet Take 0.5 tablets by mouth daily.     Insulin Glargine (LANTUS SOLOSTAR) 100 UNIT/ML Solostar Pen INJECT 20 UNITS SUBCUTANEOUSLY AT BEDTIME 3 pen 11   insulin glargine-yfgn (SEMGLEE) 100 UNIT/ML Pen INJECT 22 UNITS SUBCUTANEOUSLY AT BEDTIME (USE WITHIN 28 DAYS AFTER OPENING PEN) (CONVERTED FROM LANTUS)     lisinopril (PRINIVIL,ZESTRIL) 40 MG tablet Take 1 tablet (40 mg total) by mouth daily. 90 tablet 4   meloxicam (MOBIC) 7.5 MG tablet TAKE 1 TABLET (7.5 MG TOTAL) BY MOUTH DAILY. (Patient taking differently: TAKE 1 TABLET (7.5 MG TOTAL) BY MOUTH DAILY. As needed) 30 tablet 1   ondansetron (ZOFRAN) 8 MG tablet Take 1 tablet (8 mg total) by mouth every 8 (eight) hours as needed for nausea or vomiting. 30 tablet 1   ONE TOUCH ULTRA TEST test strip TEST GLUCOSE 3 TIMES A DAY 300 each 1   ONETOUCH DELICA LANCETS 33G MISC TEST GLUCOSE 3 TIMES A DAY 300 each 1   prochlorperazine (COMPAZINE) 10 MG tablet Take 1 tablet (10 mg total) by mouth every 6 (six) hours as needed for nausea or vomiting. 30 tablet 1   sildenafil (REVATIO) 20 MG tablet Take 1 to 2 tabs 2 - 3 hours before sex 30 tablet 11   simvastatin (ZOCOR) 20 MG tablet TAKE 1 TABLET (20 MG TOTAL) BY MOUTH AT BEDTIME. 100 tablet 4   venetoclax (VENCLEXTA) 10 & 50 & 100 MG Starter Pack Take by mouth daily. Take 20 mg for 7  days, then 50 mg daily x 7d, then 100 mg daily x 7d, then 200 mg daily x 7d. Take with food & water. 42 tablet 0   Fluocin-Hydroquinone-Tretinoin (TRI-LUMA) 0.01-4-0.05 % CREA Apply 1 application. topically at bedtime. (Patient not taking: Reported on 04/20/2023) 30 g 2   INSULIN GLARGINE, 1 UNIT DIAL, [place] INJECT 20 UNITS SUBCUTANEOUSLY DAILY (Patient not taking: Reported on 04/20/2023)     No current facility-administered medications for this visit.    REVIEW OF SYSTEMS:    10 Point review of Systems was done is negative except  as noted above.   PHYSICAL EXAMINATION: ECOG PERFORMANCE STATUS: 1 - Symptomatic but completely ambulatory . VSS GENERAL:alert, in no acute distress and comfortable SKIN: no acute rashes, no significant lesions EYES: conjunctiva are pink and non-injected, sclera anicteric OROPHARYNX: MMM, no exudates, no oropharyngeal erythema or ulceration NECK: supple, no JVD LUNGS: clear to auscultation b/l with normal respiratory effort HEART: regular rate & rhythm Extremity: no pedal edema PSYCH: alert & oriented x 3 with fluent speech NEURO: no focal motor/sensory deficits   LABORATORY DATA:  I have reviewed the data as listed .    Latest Ref Rng & Units 04/20/2023    8:39 AM 04/05/2023    7:52 AM 02/17/2023    9:46 AM  CBC  WBC 4.0 - 10.5 K/uL 6.1  56.1  102.1   Hemoglobin 13.0 - 17.0 g/dL 16.1  09.6  04.5   Hematocrit 39.0 - 52.0 % 32.1  35.4  41.7   Platelets 150 - 400 K/uL 131  110  142    .CMP     Component Value Date/Time   NA 140 04/20/2023 0839   NA 142 01/01/2017 0809   K 4.6 04/20/2023 0839   K 4.4 01/01/2017 0809   CL 106 04/20/2023 0839   CL 102 04/25/2012 1003   CO2 27 04/20/2023 0839   CO2 26 01/01/2017 0809   GLUCOSE 277 (H) 04/20/2023 0839   GLUCOSE 89 01/01/2017 0809   GLUCOSE 170 (H) 04/25/2012 1003   BUN 18 04/20/2023 0839   BUN 15.7 01/01/2017 0809   CREATININE 1.10 04/20/2023 0839   CREATININE 0.9 01/01/2017 0809   CALCIUM 8.7 (L)  04/20/2023 0839   CALCIUM 9.1 01/01/2017 0809   PROT 6.4 (L) 04/20/2023 0839   PROT 7.0 01/01/2017 0809   ALBUMIN 4.3 04/20/2023 0839   ALBUMIN 4.2 01/01/2017 0809   AST 15 04/20/2023 0839   AST 22 01/01/2017 0809   ALT 10 04/20/2023 0839   ALT 19 01/01/2017 0809   ALKPHOS 80 04/20/2023 0839   ALKPHOS 68 01/01/2017 0809   BILITOT 0.8 04/20/2023 0839   BILITOT 0.75 01/01/2017 0809   GFRNONAA >60 04/20/2023 0839   GFRAA >60 07/25/2019 0932   .    Latest Ref Rng & Units 04/20/2023    8:39 AM 04/05/2023    7:52 AM 02/17/2023    9:46 AM  CMP  Glucose 70 - 99 mg/dL 409  91  54   BUN 8 - 23 mg/dL 18  24  28    Creatinine 0.61 - 1.24 mg/dL 8.11  9.14  7.82   Sodium 135 - 145 mmol/L 140  142  140   Potassium 3.5 - 5.1 mmol/L 4.6  4.3  4.5   Chloride 98 - 111 mmol/L 106  108  107   CO2 22 - 32 mmol/L 27  27  26    Calcium 8.9 - 10.3 mg/dL 8.7  9.5  9.7   Total Protein 6.5 - 8.1 g/dL 6.4  6.9  7.0   Total Bilirubin 0.0 - 1.2 mg/dL 0.8  0.9  1.3   Alkaline Phos 38 - 126 U/L 80  100  82   AST 15 - 41 U/L 15  17  18    ALT 0 - 44 U/L 10  10  10     . RADIOGRAPHIC STUDIES: I have personally reviewed the radiological images as listed and agreed with the findings in the report. No results found.   ASSESSMENT & PLAN:  Tyler Scott is  a 68 y.o. male who presents to the clinic for follow up of CLL.      1.  CLL /SLL diagnosed in 2012 after presenting with stage IIIA, leukocytosis and lymphadenopathy.  He is currently on Calquence with continued response in his white cell count continues to drop appropriately. His hemoglobin and platelet count has also nearly normalized at this time.   2.  Lymphadenopathy: Related to his lymphoproliferative disorder with cervical adenopathy.  This has nearly resolved at this time.   3.  Tumor lysis syndrome: He is at low risk at this time despite his bulky disease and will continue to monitor his electrolytes for the time being.  PLAN: -Due for Cycle  1, Day 8 of Gazyva today -Labs from today reviewed. WBC 6.1, Hgb 10.7, Plt 131, Creatinine and LFTs normal.  -Proceed with treatment without any dose modifications.  -Glucose level was 277, recommend follow up with PCP to further manage. Patient reports having breakfast before labs with pancakes and green juice.  -Tentative plan to start Venetoclax with C2 or C3 of treatment   FOLLOW-UP: -per integrated scheduling  Mr. Sarno expressed understanding and satisfaction with the plan provided.    I have spent a total of 30 minutes minutes of face-to-face and non-face-to-face time, preparing to see the patient,  performing a medically appropriate examination, counseling and educating the patient, ordering medications/tests/procedures, documenting clinical information in the electronic health record, independently interpreting results and communicating results to the patient, and care coordination.   Georga Kaufmann PA-C Dept of Hematology and Oncology [facility] Phone: [phone]

## 2023-04-20 NOTE — Progress Notes (Signed)
 Pt reported to infusion today with a blood glucose of 277. Pt reported eating large breakfast of pancakes and green juice. Per Dr. Candise Che OK to proceed with tx. Dr. Candise Che recommended Pt f/u with PCP for blood sugar maintenance. This RN made Pt aware. Pt verbalized understanding.

## 2023-04-26 ENCOUNTER — Other Ambulatory Visit: Payer: Self-pay

## 2023-04-26 DIAGNOSIS — C9110 Chronic lymphocytic leukemia of B-cell type not having achieved remission: Secondary | ICD-10-CM

## 2023-04-27 ENCOUNTER — Inpatient Hospital Stay

## 2023-04-27 ENCOUNTER — Inpatient Hospital Stay (HOSPITAL_BASED_OUTPATIENT_CLINIC_OR_DEPARTMENT_OTHER): Admitting: Hematology

## 2023-04-27 VITALS — BP 134/55 | HR 90 | Temp 97.9°F | Resp 17 | Ht 68.0 in | Wt 147.6 lb

## 2023-04-27 DIAGNOSIS — C9110 Chronic lymphocytic leukemia of B-cell type not having achieved remission: Secondary | ICD-10-CM | POA: Diagnosis not present

## 2023-04-27 DIAGNOSIS — Z5112 Encounter for antineoplastic immunotherapy: Secondary | ICD-10-CM

## 2023-04-27 LAB — CBC WITH DIFFERENTIAL (CANCER CENTER ONLY)
Abs Immature Granulocytes: 0.09 10*3/uL — ABNORMAL HIGH (ref 0.00–0.07)
Basophils Absolute: 0 10*3/uL (ref 0.0–0.1)
Basophils Relative: 0 %
Eosinophils Absolute: 0 10*3/uL (ref 0.0–0.5)
Eosinophils Relative: 0 %
HCT: 31.3 % — ABNORMAL LOW (ref 39.0–52.0)
Hemoglobin: 10.4 g/dL — ABNORMAL LOW (ref 13.0–17.0)
Immature Granulocytes: 3 %
Lymphocytes Relative: 25 %
Lymphs Abs: 0.9 10*3/uL (ref 0.7–4.0)
MCH: 31.3 pg (ref 26.0–34.0)
MCHC: 33.2 g/dL (ref 30.0–36.0)
MCV: 94.3 fL (ref 80.0–100.0)
Monocytes Absolute: 0.3 10*3/uL (ref 0.1–1.0)
Monocytes Relative: 8 %
Neutro Abs: 2.3 10*3/uL (ref 1.7–7.7)
Neutrophils Relative %: 64 %
Platelet Count: 114 10*3/uL — ABNORMAL LOW (ref 150–400)
RBC: 3.32 MIL/uL — ABNORMAL LOW (ref 4.22–5.81)
RDW: 14.6 % (ref 11.5–15.5)
WBC Count: 3.6 10*3/uL — ABNORMAL LOW (ref 4.0–10.5)
nRBC: 0 % (ref 0.0–0.2)

## 2023-04-27 LAB — CMP (CANCER CENTER ONLY)
ALT: 10 U/L (ref 0–44)
AST: 22 U/L (ref 15–41)
Albumin: 4.2 g/dL (ref 3.5–5.0)
Alkaline Phosphatase: 69 U/L (ref 38–126)
Anion gap: 8 (ref 5–15)
BUN: 8 mg/dL (ref 8–23)
CO2: 26 mmol/L (ref 22–32)
Calcium: 9.3 mg/dL (ref 8.9–10.3)
Chloride: 104 mmol/L (ref 98–111)
Creatinine: 1.1 mg/dL (ref 0.61–1.24)
GFR, Estimated: >60 mL/min (ref >60)
Glucose, Bld: 56 mg/dL — ABNORMAL LOW (ref 70–99)
Potassium: 3.6 mmol/L (ref 3.5–5.1)
Sodium: 138 mmol/L (ref 135–145)
Total Bilirubin: 1 mg/dL (ref 0.0–1.2)
Total Protein: 6.6 g/dL (ref 6.5–8.1)

## 2023-04-27 NOTE — Progress Notes (Signed)
 HEMATOLOGY/ONCOLOGY CLINIC NOTE  Date of Service: 04/27/2023  Patient Care Team: Anson Fret, MD as PCP - General (Family Medicine) Drema Halon, MD (Inactive)  CHIEF COMPLAINTS/PURPOSE OF CONSULTATION:  Evaluation and management of CLL  DIAGNOSIS: 68 year old man with CLL diagnosed in 2012 after presenting with lymphocytosis and adenopathy and stage IIIA disease.   Prior therapy:    Bendamustine and rituximab started in July 2017 and received total of 2 cycles. Therapy discontinued in August 2017 based on patient's wishes after achieving partial response.   He developed a relapse disease in April 2019. Ibrutinib 420 mg daily started in April 2019 and discontinued in September 2023 after poor adherence.   CURRENT THERAPY: Calquence 100 mg twice daily started in September 2023.  HISTORY OF PRESENTING ILLNESS:  Tyler Scott is a wonderful 68 y.o. male who has been a previous patient of Dr. Clelia Croft. He is here for evaluation and management of CLL.   Patient was last seen by Dr. Clelia Croft on 02/18/2022 and was doing well overall with no new medical concerns.  Today, he reports that he has been on and off different treatment since 2017, but has been on Calquence since September, 2023. He does admit that he does forget to take this medication a couple times a week. He reports that his previous treatment of Ibrutinib did cause him diarrhea.  He did receive two previous injection in his buttocks, but is unsure it is was steroids or antibiotics. Patient is UTD with influenza, RSV, and COVID-19 vaccinations. He denies any abdominal pain, but does note some discomfort along the spleen. He denies any bleeding issues, chest pain, diarrhea, resolved lumps/bumps in neck, infection issues, fevers, or chills.  He does note some SOB related to allergies and chest/sinus congestion. His symptoms are improving. He also reports that his neck edema had improved. He does complain of  sleep deprivation/poor sleep habits and notes some stress. He does typically sleep during the day.  He does take vitamin D and C supplements regularly. On average, he does drink 1 glass of wine a day and denies any major medication changes.  INTERVAL HISTORY:  Tyler Scott is a wonderful 68 y.o. male here for continued evaluation and management of CLL. Patient is scheduled for cycle 1 day 15 of her treatment on 04/29/2023.   Patient was last seen by PA Thayil and she complained of intermittent episodes of diarrhea that resolved on its own. He was tolerating his treatment well.   Patient notes he has been doing fairly well since our last visit. He denies any new infection issues, fever, chills, night sweats, unexpected weight loss, back pain, chest pain, abdominal pain, or leg swelling. He does complain of mild diarrhea, fatigue, and chronic night sweats.   Patient notes he has been eating fairly well and has been staying well-hydrated. He notes that his weight has been stable.   He has been tolerating his treatment well without any new or severe toxicities.   MEDICAL HISTORY:  Past Medical History:  Diagnosis Date   Allergy    Arthritis    CLL (chronic lymphocytic leukemia) (HCC)    stage III: asymptomatic; on observation   DIABETES MELLITUS, TYPE I, UNCONTROLLED 12/30/2006   insulin   Diabetic retinopathy    ERECTILE DYSFUNCTION, ORGANIC 12/30/2006   Hx of adenomatous polyp of colon 05/24/2014   HYPERLIPIDEMIA 12/30/2006   HYPERTENSION 12/30/2006   LOW BACK PAIN, ACUTE 12/21/2006    SURGICAL HISTORY: Past Surgical History:  Procedure Laterality Date   COLONOSCOPY     great right toe- Fused the I P joint      SOCIAL HISTORY: Social History   Socioeconomic History   Marital status: Married    Spouse name: Not on file   Number of children: Not on file   Years of education: Not on file   Highest education level: Not on file  Occupational History   Not on file   Tobacco Use   Smoking status: Never   Smokeless tobacco: Never  Vaping Use   Vaping status: Never Used  Substance and Sexual Activity   Alcohol use: Yes    Alcohol/week: 4.0 standard drinks of alcohol    Types: 4 Glasses of wine per week   Drug use: No   Sexual activity: Not on file    Comment: regular exercise - yes  Other Topics Concern   Not on file  Social History Narrative   Not on file   Social Drivers of Health   Financial Resource Strain: Not on file  Food Insecurity: Not on file  Transportation Needs: Not on file  Physical Activity: Not on file  Stress: Not on file  Social Connections: Unknown (06/12/2021)   Received from [facility]   Social Network    Social Network: Not on file  Intimate Partner Violence: Unknown (05/16/2021)   Received [facility]   HITS    Physically Hurt: Not on file    Insult or Talk Down To: Not on file    Threaten Physical Harm: Not on file    Scream or Curse: Not on file    FAMILY HISTORY: Family History  Problem Relation Age of Onset   Alcohol abuse Other    Colon cancer Brother 81       Died 2013/11/02   Diabetes Mellitus II Mother    Esophageal cancer Neg Hx    Rectal cancer Neg Hx    Stomach cancer Neg Hx     ALLERGIES:  has no known allergies.  MEDICATIONS:  Current Outpatient Medications  Medication Sig Dispense Refill   acyclovir (ZOVIRAX) 400 MG tablet Take 1 tablet (400 mg total) by mouth daily. 30 tablet 5   allopurinol (ZYLOPRIM) 300 MG tablet Take 0.5 tablets (150 mg total) by mouth daily. 30 tablet 0   Blood Glucose Monitoring Suppl (ONE TOUCH ULTRA 2) W/DEVICE KIT See admin instructions.  0   Cholecalciferol 50 MCG (2000 UT) TABS TAKE ONE TABLET BY MOUTH DAILY FOR LOW VITAMIN D.     doxepin (SINEQUAN) 25 MG capsule Take 1 capsule by mouth at bedtime.     empagliflozin (JARDIANCE) 25 MG TABS tablet Take 0.5 tablets by mouth every morning. Only if blood sugars get too high      finasteride (PROPECIA) 1 MG tablet Take 1 tablet by mouth daily.     finasteride (PROSCAR) 5 MG tablet Take 0.5 tablets by mouth daily.     Fluocin-Hydroquinone-Tretinoin (TRI-LUMA) 0.01-4-0.05 % CREA Apply 1 application. topically at bedtime. (Patient not taking: Reported on 04/20/2023) 30 g 2   Insulin Glargine (LANTUS SOLOSTAR) 100 UNIT/ML Solostar Pen INJECT 20 UNITS SUBCUTANEOUSLY AT BEDTIME 3 pen 11   INSULIN GLARGINE, 1 UNIT DIAL, Shorter INJECT 20 UNITS SUBCUTANEOUSLY DAILY (Patient not taking: Reported on 04/20/2023)     insulin glargine-yfgn (SEMGLEE) 100 UNIT/ML Pen INJECT 22 UNITS SUBCUTANEOUSLY AT BEDTIME (USE WITHIN 28 DAYS AFTER OPENING PEN) (CONVERTED FROM LANTUS)     lisinopril (  PRINIVIL,ZESTRIL) 40 MG tablet Take 1 tablet (40 mg total) by mouth daily. 90 tablet 4   meloxicam (MOBIC) 7.5 MG tablet TAKE 1 TABLET (7.5 MG TOTAL) BY MOUTH DAILY. (Patient taking differently: TAKE 1 TABLET (7.5 MG TOTAL) BY MOUTH DAILY. As needed) 30 tablet 1   ondansetron (ZOFRAN) 8 MG tablet Take 1 tablet (8 mg total) by mouth every 8 (eight) hours as needed for nausea or vomiting. 30 tablet 1   ONE TOUCH ULTRA TEST test strip TEST GLUCOSE 3 TIMES A DAY 300 each 1   ONETOUCH DELICA LANCETS 33G MISC TEST GLUCOSE 3 TIMES A DAY 300 each 1   prochlorperazine (COMPAZINE) 10 MG tablet Take 1 tablet (10 mg total) by mouth every 6 (six) hours as needed for nausea or vomiting. 30 tablet 1   sildenafil (REVATIO) 20 MG tablet Take 1 to 2 tabs 2 - 3 hours before sex 30 tablet 11   simvastatin (ZOCOR) 20 MG tablet TAKE 1 TABLET (20 MG TOTAL) BY MOUTH AT BEDTIME. 100 tablet 4   venetoclax (VENCLEXTA) 10 & 50 & 100 MG Starter Pack Take by mouth daily. Take 20 mg for 7 days, then 50 mg daily x 7d, then 100 mg daily x 7d, then 200 mg daily x 7d. Take with food & water. 42 tablet 0   No current facility-administered medications for this visit.    REVIEW OF SYSTEMS:    10 Point review of Systems was done is negative except  as noted above.   PHYSICAL EXAMINATION: ECOG PERFORMANCE STATUS: 1 - Symptomatic but completely ambulatory .Marland KitchenBP (!) 134/55 Comment: Nurse notified  Pulse 90   Temp 97.9 F (36.6 C) (Temporal)   Resp 17   Ht 5\' 8"  (1.727 m)   Wt 147 lb 9.6 oz (67 kg)   SpO2 100%   BMI 22.44 kg/m   GENERAL:alert, in no acute distress and comfortable SKIN: no acute rashes, no significant lesions EYES: conjunctiva are pink and non-injected, sclera anicteric OROPHARYNX: MMM, no exudates, no oropharyngeal erythema or ulceration NECK: supple, no JVD LYMPH:  no palpable lymphadenopathy in the cervical, axillary or inguinal regions LUNGS: clear to auscultation b/l with normal respiratory effort HEART: regular rate & rhythm ABDOMEN:  normoactive bowel sounds , non tender, not distended. Extremity: no pedal edema PSYCH: alert & oriented x 3 with fluent speech NEURO: no focal motor/sensory deficits   LABORATORY DATA:  I have reviewed the data as listed .    Latest Ref Rng & Units 04/27/2023    9:35 AM 04/20/2023    8:39 AM 04/05/2023    7:52 AM  CBC  WBC 4.0 - 10.5 K/uL 3.6  6.1  56.1   Hemoglobin 13.0 - 17.0 g/dL 16.1  09.6  04.5   Hematocrit 39.0 - 52.0 % 31.3  32.1  35.4   Platelets 150 - 400 K/uL 114  131  110    .CMP     Component Value Date/Time   NA 138 04/27/2023 0935   NA 142 01/01/2017 0809   K 3.6 04/27/2023 0935   K 4.4 01/01/2017 0809   CL 104 04/27/2023 0935   CL 102 04/25/2012 1003   CO2 26 04/27/2023 0935   CO2 26 01/01/2017 0809   GLUCOSE 56 (L) 04/27/2023 0935   GLUCOSE 89 01/01/2017 0809   GLUCOSE 170 (H) 04/25/2012 1003   BUN 8 04/27/2023 0935   BUN 15.7 01/01/2017 0809   CREATININE 1.10 04/27/2023 0935   CREATININE 0.9 01/01/2017 0809  CALCIUM 9.3 04/27/2023 0935   CALCIUM 9.1 01/01/2017 0809   PROT 6.6 04/27/2023 0935   PROT 7.0 01/01/2017 0809   ALBUMIN 4.2 04/27/2023 0935   ALBUMIN 4.2 01/01/2017 0809   AST 22 04/27/2023 0935   AST 22 01/01/2017 0809   ALT  10 04/27/2023 0935   ALT 19 01/01/2017 0809   ALKPHOS 69 04/27/2023 0935   ALKPHOS 68 01/01/2017 0809   BILITOT 1.0 04/27/2023 0935   BILITOT 0.75 01/01/2017 0809   GFRNONAA >60 04/27/2023 0935   GFRAA >60 07/25/2019 0932   .    Latest Ref Rng & Units 04/27/2023    9:35 AM 04/20/2023    8:39 AM 04/05/2023    7:52 AM  CMP  Glucose 70 - 99 mg/dL 56  161  91   BUN 8 - 23 mg/dL 8  18  24    Creatinine 0.61 - 1.24 mg/dL 0.96  0.45  4.09   Sodium 135 - 145 mmol/L 138  140  142   Potassium 3.5 - 5.1 mmol/L 3.6  4.6  4.3   Chloride 98 - 111 mmol/L 104  106  108   CO2 22 - 32 mmol/L 26  27  27    Calcium 8.9 - 10.3 mg/dL 9.3  8.7  9.5   Total Protein 6.5 - 8.1 g/dL 6.6  6.4  6.9   Total Bilirubin 0.0 - 1.2 mg/dL 1.0  0.8  0.9   Alkaline Phos 38 - 126 U/L 69  80  100   AST 15 - 41 U/L 22  15  17    ALT 0 - 44 U/L 10  10  10     . RADIOGRAPHIC STUDIES: I have personally reviewed the radiological images as listed and agreed with the findings in the report. No results found.   ASSESSMENT & PLAN:   68 year old man with:      1.  CLL /SLL diagnosed in 2012 after presenting with stage IIIA, leukocytosis and lymphadenopathy.  He is currently on Calquence with continued response in his white cell count continues to drop appropriately. His hemoglobin and platelet count has also nearly normalized at this time.   2.  Lymphadenopathy: Related to his lymphoproliferative disorder with cervical adenopathy.  This has nearly resolved at this time.   3.  Tumor lysis syndrome: He is at low risk at this time despite his bulky disease and will continue to monitor his electrolytes for the time being.  PLAN: -Discussed lab results from today, 04/27/2023, in detail with the patient. CBC shows low WBC of 3.6 K, low Hgb of 10.4 g/dL with Hct of 81.1%, and low platelets of 114 K. CMP stable with slightly low blood glucose level of 56. Pt is fasting.  -Discussed with the patient that the plan is to start  Veneotclax cycle 3. Pt agrees.  -Patient has been tolerating his treatment well with mild toxicity of diarrhea.  -Patient can proceed with cycle 1 day 15 of his treatment today without any dose modifications.   -answered all of patient's questions in detail  FOLLOW-UP: F/u for C1D15 as scheduled on 3/20 F/u for C2D1 as scheduled with labs and MD visit  The total time spent in the appointment was 30 minutes* .  All of the patient's questions were answered with apparent satisfaction. The patient knows to call the clinic with any problems, questions or concerns.   Wyvonnia Lora MD MS AAHIVMS [facility] Hematology/Oncology Physician [facility]  .*Total Encounter Time as  defined by the Centers for Medicare and Medicaid Services includes, in addition to the face-to-face time of a patient visit (documented in the note above) non-face-to-face time: obtaining and reviewing outside history, ordering and reviewing medications, tests or procedures, care coordination (communications with other health care professionals or caregivers) and documentation in the medical record.   I,Param Shah,acting as a Neurosurgeon for Wyvonnia Lora, MD.,have documented all relevant documentation on the behalf of Wyvonnia Lora, MD,as directed by  Wyvonnia Lora, MD while in the presence of Wyvonnia Lora, MD.  .I have reviewed the above documentation for accuracy and completeness, and I agree with the above. Johney Maine MD

## 2023-04-28 MED FILL — Dexamethasone Sodium Phosphate Inj 100 MG/10ML: INTRAMUSCULAR | Qty: 2 | Status: AC

## 2023-04-29 ENCOUNTER — Inpatient Hospital Stay

## 2023-04-29 VITALS — BP 161/78 | HR 84 | Temp 97.8°F | Resp 16

## 2023-04-29 DIAGNOSIS — C9110 Chronic lymphocytic leukemia of B-cell type not having achieved remission: Secondary | ICD-10-CM

## 2023-04-29 DIAGNOSIS — Z5112 Encounter for antineoplastic immunotherapy: Secondary | ICD-10-CM | POA: Diagnosis not present

## 2023-04-29 MED ORDER — SODIUM CHLORIDE 0.9 % IV SOLN
20.0000 mg | Freq: Once | INTRAVENOUS | Status: AC
  Administered 2023-04-29: 20 mg via INTRAVENOUS
  Filled 2023-04-29: qty 20
  Filled 2023-04-29: qty 2

## 2023-04-29 MED ORDER — FAMOTIDINE IN NACL 20-0.9 MG/50ML-% IV SOLN
20.0000 mg | Freq: Once | INTRAVENOUS | Status: AC
  Administered 2023-04-29: 20 mg via INTRAVENOUS
  Filled 2023-04-29: qty 50

## 2023-04-29 MED ORDER — DIPHENHYDRAMINE HCL 50 MG/ML IJ SOLN
50.0000 mg | Freq: Once | INTRAMUSCULAR | Status: AC
  Administered 2023-04-29: 50 mg via INTRAVENOUS
  Filled 2023-04-29: qty 1

## 2023-04-29 MED ORDER — ACETAMINOPHEN 500 MG PO TABS
1000.0000 mg | ORAL_TABLET | Freq: Once | ORAL | Status: AC
Start: 2023-04-29 — End: 2023-04-29
  Administered 2023-04-29: 1000 mg via ORAL
  Filled 2023-04-29: qty 2

## 2023-04-29 MED ORDER — SODIUM CHLORIDE 0.9 % IV SOLN
INTRAVENOUS | Status: DC
Start: 2023-04-29 — End: 2023-04-29

## 2023-04-29 MED ORDER — MONTELUKAST SODIUM 10 MG PO TABS
10.0000 mg | ORAL_TABLET | Freq: Once | ORAL | Status: AC
  Administered 2023-04-29: 10 mg via ORAL
  Filled 2023-04-29: qty 1

## 2023-04-29 MED ORDER — SODIUM CHLORIDE 0.9 % IV SOLN
1000.0000 mg | Freq: Once | INTRAVENOUS | Status: AC
  Administered 2023-04-29: 1000 mg via INTRAVENOUS
  Filled 2023-04-29: qty 40

## 2023-04-29 NOTE — Patient Instructions (Signed)
 [facility] - A DEPT OF [facility]   Discharge Instructions: Thank you for choosing [facility] to provide your oncology and hematology care.   If you have a lab appointment with the Cancer Center, please go directly to the Cancer Center and check in at the registration area.   Wear comfortable clothing and clothing appropriate for easy access to any Portacath or PICC line.   We strive to give you quality time with your provider. You may need to reschedule your appointment if you arrive late (15 or more minutes).  Arriving late affects you and other patients whose appointments are after yours.  Also, if you miss three or more appointments without notifying the office, you may be dismissed from the clinic at the provider's discretion.      For prescription refill requests, have your pharmacy contact our office and allow 72 hours for refills to be completed.    Today you received the following chemotherapy and/or immunotherapy agents: Obinutuzumab Shelly Bombard)      To help prevent nausea and vomiting after your treatment, we encourage you to take your nausea medication as directed.  BELOW ARE SYMPTOMS THAT SHOULD BE REPORTED IMMEDIATELY: *FEVER GREATER THAN 100.4 F (38 C) OR HIGHER *CHILLS OR SWEATING *NAUSEA AND VOMITING THAT IS NOT CONTROLLED WITH YOUR NAUSEA MEDICATION *UNUSUAL SHORTNESS OF BREATH *UNUSUAL BRUISING OR BLEEDING *URINARY PROBLEMS (pain or burning when urinating, or frequent urination) *BOWEL PROBLEMS (unusual diarrhea, constipation, pain near the anus) TENDERNESS IN MOUTH AND THROAT WITH OR WITHOUT PRESENCE OF ULCERS (sore throat, sores in mouth, or a toothache) UNUSUAL RASH, SWELLING OR PAIN  UNUSUAL VAGINAL DISCHARGE OR ITCHING   Items with * indicate a potential emergency and should be followed up as soon as possible or go to the Emergency Department if any problems should occur.  Please show the CHEMOTHERAPY ALERT CARD or  IMMUNOTHERAPY ALERT CARD at check-in to the Emergency Department and triage nurse.  Should you have questions after your visit or need to cancel or reschedule your appointment, please contact [facility] - A DEPT OF [facility]  Dept: [phone]  and follow the prompts.  Office hours are 8:00 a.m. to 4:30 p.m. Monday - Friday. Please note that voicemails left after 4:00 p.m. may not be returned until the following business day.  We are closed weekends and major holidays. You have access to a nurse at all times for urgent questions. Please call the main number to the clinic Dept: [phone] and follow the prompts.   For any non-urgent questions, you may also contact your provider using MyChart. We now offer e-Visits for anyone 33 and older to request care online for non-urgent symptoms. For details visit [email or web address].   Also download the MyChart app! Go to the app store, search "MyChart", open the app, select [place], and log in with your MyChart username and password.

## 2023-05-03 ENCOUNTER — Inpatient Hospital Stay

## 2023-05-03 ENCOUNTER — Telehealth: Payer: Self-pay | Admitting: Pharmacy Technician

## 2023-05-03 ENCOUNTER — Other Ambulatory Visit (HOSPITAL_COMMUNITY): Payer: Self-pay

## 2023-05-03 ENCOUNTER — Inpatient Hospital Stay: Admitting: Hematology

## 2023-05-03 ENCOUNTER — Encounter: Payer: Self-pay | Admitting: Hematology

## 2023-05-03 NOTE — Telephone Encounter (Signed)
 Oral Oncology Patient Advocate Encounter  After completing a benefits investigation, prior authorization for Vnclexta is not required at this time through [place]  Patient has an active referral on file for the [facility] pharmacy.     Omer Jack, CPhT-Adv Oncology Pharmacy Patient Advocate [facility]  Direct Number: [phone]  Fax: [phone]

## 2023-05-11 MED FILL — Dexamethasone Sodium Phosphate Inj 100 MG/10ML: INTRAMUSCULAR | Qty: 2 | Status: AC

## 2023-05-12 ENCOUNTER — Inpatient Hospital Stay

## 2023-05-12 ENCOUNTER — Other Ambulatory Visit: Payer: Self-pay

## 2023-05-12 ENCOUNTER — Inpatient Hospital Stay: Admitting: Hematology

## 2023-05-12 ENCOUNTER — Inpatient Hospital Stay: Attending: Hematology

## 2023-05-12 VITALS — BP 96/47 | HR 95 | Temp 97.5°F | Resp 18 | Ht 68.0 in | Wt 145.3 lb

## 2023-05-12 VITALS — BP 153/84 | HR 92 | Temp 97.8°F | Resp 18

## 2023-05-12 DIAGNOSIS — C9110 Chronic lymphocytic leukemia of B-cell type not having achieved remission: Secondary | ICD-10-CM

## 2023-05-12 DIAGNOSIS — Z5112 Encounter for antineoplastic immunotherapy: Secondary | ICD-10-CM | POA: Insufficient documentation

## 2023-05-12 DIAGNOSIS — D696 Thrombocytopenia, unspecified: Secondary | ICD-10-CM

## 2023-05-12 DIAGNOSIS — R590 Localized enlarged lymph nodes: Secondary | ICD-10-CM | POA: Insufficient documentation

## 2023-05-12 LAB — CMP (CANCER CENTER ONLY)
ALT: 9 U/L (ref 0–44)
AST: 19 U/L (ref 15–41)
Albumin: 4.3 g/dL (ref 3.5–5.0)
Alkaline Phosphatase: 79 U/L (ref 38–126)
Anion gap: 8 (ref 5–15)
BUN: 26 mg/dL — ABNORMAL HIGH (ref 8–23)
CO2: 25 mmol/L (ref 22–32)
Calcium: 9.4 mg/dL (ref 8.9–10.3)
Chloride: 105 mmol/L (ref 98–111)
Creatinine: 1.29 mg/dL — ABNORMAL HIGH (ref 0.61–1.24)
GFR, Estimated: >60 mL/min (ref >60)
Glucose, Bld: 135 mg/dL — ABNORMAL HIGH (ref 70–99)
Potassium: 4.5 mmol/L (ref 3.5–5.1)
Sodium: 138 mmol/L (ref 135–145)
Total Bilirubin: 1.1 mg/dL (ref 0.0–1.2)
Total Protein: 6.7 g/dL (ref 6.5–8.1)

## 2023-05-12 LAB — CBC WITH DIFFERENTIAL (CANCER CENTER ONLY)
Abs Immature Granulocytes: 0.05 10*3/uL (ref 0.00–0.07)
Basophils Absolute: 0 10*3/uL (ref 0.0–0.1)
Basophils Relative: 1 %
Eosinophils Absolute: 0 10*3/uL (ref 0.0–0.5)
Eosinophils Relative: 0 %
HCT: 32.1 % — ABNORMAL LOW (ref 39.0–52.0)
Hemoglobin: 10.7 g/dL — ABNORMAL LOW (ref 13.0–17.0)
Immature Granulocytes: 2 %
Lymphocytes Relative: 38 %
Lymphs Abs: 1.2 10*3/uL (ref 0.7–4.0)
MCH: 31.6 pg (ref 26.0–34.0)
MCHC: 33.3 g/dL (ref 30.0–36.0)
MCV: 94.7 fL (ref 80.0–100.0)
Monocytes Absolute: 0.2 10*3/uL (ref 0.1–1.0)
Monocytes Relative: 8 %
Neutro Abs: 1.6 10*3/uL — ABNORMAL LOW (ref 1.7–7.7)
Neutrophils Relative %: 51 %
Platelet Count: 106 10*3/uL — ABNORMAL LOW (ref 150–400)
RBC: 3.39 MIL/uL — ABNORMAL LOW (ref 4.22–5.81)
RDW: 15.3 % (ref 11.5–15.5)
WBC Count: 3.2 10*3/uL — ABNORMAL LOW (ref 4.0–10.5)
nRBC: 1.3 % — ABNORMAL HIGH (ref 0.0–0.2)

## 2023-05-12 MED ORDER — FAMOTIDINE IN NACL 20-0.9 MG/50ML-% IV SOLN
20.0000 mg | Freq: Once | INTRAVENOUS | Status: AC
Start: 2023-05-12 — End: 2023-05-12
  Administered 2023-05-12: 20 mg via INTRAVENOUS
  Filled 2023-05-12: qty 50

## 2023-05-12 MED ORDER — DEXAMETHASONE SODIUM PHOSPHATE 100 MG/10ML IJ SOLN
20.0000 mg | Freq: Once | INTRAMUSCULAR | Status: AC
  Administered 2023-05-12: 20 mg via INTRAVENOUS
  Filled 2023-05-12: qty 20

## 2023-05-12 MED ORDER — ACETAMINOPHEN 500 MG PO TABS
1000.0000 mg | ORAL_TABLET | Freq: Once | ORAL | Status: AC
  Administered 2023-05-12: 1000 mg via ORAL
  Filled 2023-05-12: qty 2

## 2023-05-12 MED ORDER — VENETOCLAX 10 & 50 & 100 MG PO TBPK: ORAL_TABLET | ORAL | 0 refills | Status: DC

## 2023-05-12 MED ORDER — SODIUM CHLORIDE 0.9 % IV SOLN: INTRAVENOUS | Status: DC

## 2023-05-12 MED ORDER — SODIUM CHLORIDE 0.9% FLUSH: 10.0000 mL | INTRAVENOUS | Status: DC | PRN

## 2023-05-12 MED ORDER — HEPARIN SOD (PORK) LOCK FLUSH 100 UNIT/ML IV SOLN: 500.0000 [IU] | Freq: Once | INTRAVENOUS | Status: DC | PRN

## 2023-05-12 MED ORDER — SODIUM CHLORIDE 0.9 % IV SOLN
1000.0000 mg | Freq: Once | INTRAVENOUS | Status: AC
  Administered 2023-05-12: 1000 mg via INTRAVENOUS
  Filled 2023-05-12: qty 40

## 2023-05-12 MED ORDER — DIPHENHYDRAMINE HCL 50 MG/ML IJ SOLN
50.0000 mg | Freq: Once | INTRAMUSCULAR | Status: AC
  Administered 2023-05-12: 50 mg via INTRAVENOUS
  Filled 2023-05-12: qty 1

## 2023-05-12 MED ORDER — MONTELUKAST SODIUM 10 MG PO TABS
10.0000 mg | ORAL_TABLET | Freq: Once | ORAL | Status: AC
  Administered 2023-05-12: 10 mg via ORAL
  Filled 2023-05-12: qty 1

## 2023-05-12 NOTE — Progress Notes (Signed)
 Patient seen by Dr. Addison Naegeli are not all within treatment parameters. Dr Candise Che aware of BP readings  Labs reviewed: and are within treatment parameters.  Per physician team, patient is ready for treatment and there are NO modifications to the treatment plan.

## 2023-05-12 NOTE — Progress Notes (Signed)
 HEMATOLOGY/ONCOLOGY CLINIC NOTE  Date of Service: 05/12/2023  Patient Care Team: Anson Fret, MD as PCP - General (Family Medicine) Drema Halon, MD (Inactive)  CHIEF COMPLAINTS/PURPOSE OF CONSULTATION:  Evaluation and management of CLL  DIAGNOSIS: 68 year old man with CLL diagnosed in 2012 after presenting with lymphocytosis and adenopathy and stage IIIA disease.   Prior therapy:    Bendamustine and rituximab started in July 2017 and received total of 2 cycles. Therapy discontinued in August 2017 based on patient's wishes after achieving partial response.   He developed a relapse disease in April 2019. Ibrutinib 420 mg daily started in April 2019 and discontinued in September 2023 after poor adherence.   CURRENT THERAPY: Calquence 100 mg twice daily started in September 2023.  HISTORY OF PRESENTING ILLNESS:  Tyler Scott is a wonderful 68 y.o. male who has been a previous patient of Dr. Clelia Croft. He is here for evaluation and management of CLL.   Patient was last seen by Dr. Clelia Croft on 02/18/2022 and was doing well overall with no new medical concerns.  Today, he reports that he has been on and off different treatment since 2017, but has been on Calquence since September, 2023. He does admit that he does forget to take this medication a couple times a week. He reports that his previous treatment of Ibrutinib did cause him diarrhea.  He did receive two previous injection in his buttocks, but is unsure it is was steroids or antibiotics. Patient is UTD with influenza, RSV, and COVID-19 vaccinations. He denies any abdominal pain, but does note some discomfort along the spleen. He denies any bleeding issues, chest pain, diarrhea, resolved lumps/bumps in neck, infection issues, fevers, or chills.  He does note some SOB related to allergies and chest/sinus congestion. His symptoms are improving. He also reports that his neck edema had improved. He does complain of sleep  deprivation/poor sleep habits and notes some stress. He does typically sleep during the day.  He does take vitamin D and C supplements regularly. On average, he does drink 1 glass of wine a day and denies any major medication changes.  INTERVAL HISTORY:  Tyler Scott is a wonderful 68 y.o. male here for continued evaluation and management of CLL. Patient is scheduled for cycle 2 day 1 of his Gazyva treatment.  Patient was last seen by me on 04/27/2023 and complained of mild diarrhea, fatigue, and chronic night sweats.   Today, he reports that he has tolerated Gazyva treatment well with no new or significant toxicity issues.   He reports endorsing some mild lower extremity swelling.   He complains of intermittent diarrhea, though it is not constant like previously.  He denies any infection issues, fever, new lumps/bumps, or other new symptoms.   He continues to engage in yard work though he complains of low energy.   Patient reports that he has been eating well and denies lack of appetite. He was noted to have lost 2 pounds since his last visit two weeks ago. Patient denies any nausea or vomiting.  He complains of night sweats which are still fairly bothersome.  Patient complains of back issues attributed to age.  MEDICAL HISTORY:  Past Medical History:  Diagnosis Date   Allergy    Arthritis    CLL (chronic lymphocytic leukemia) (HCC)    stage III: asymptomatic; on observation   DIABETES MELLITUS, TYPE I, UNCONTROLLED 12/30/2006   insulin   Diabetic retinopathy    ERECTILE DYSFUNCTION, ORGANIC 12/30/2006  Hx of adenomatous polyp of colon 05/24/2014   HYPERLIPIDEMIA 12/30/2006   HYPERTENSION 12/30/2006   LOW BACK PAIN, ACUTE 12/21/2006    SURGICAL HISTORY: Past Surgical History:  Procedure Laterality Date   COLONOSCOPY     great right toe- Fused the I P joint      SOCIAL HISTORY: Social History   Socioeconomic History   Marital status: Married    Spouse  name: Not on file   Number of children: Not on file   Years of education: Not on file   Highest education level: Not on file  Occupational History   Not on file  Tobacco Use   Smoking status: Never   Smokeless tobacco: Never  Vaping Use   Vaping status: Never Used  Substance and Sexual Activity   Alcohol use: Yes    Alcohol/week: 4.0 standard drinks of alcohol    Types: 4 Glasses of wine per week   Drug use: No   Sexual activity: Not on file    Comment: regular exercise - yes  Other Topics Concern   Not on file  Social History Narrative   Not on file   Social Drivers of Health   Financial Resource Strain: Not on file  Food Insecurity: Not on file  Transportation Needs: Not on file  Physical Activity: Not on file  Stress: Not on file  Social Connections: Unknown (06/12/2021)   Received from [facility], [facility]   Social Network    Social Network: Not on file  Intimate Partner Violence: Unknown (05/16/2021)   Received from [facility], [facility]   HITS    Physically Hurt: Not on file    Insult or Talk Down To: Not on file    Threaten Physical Harm: Not on file    Scream or Curse: Not on file    FAMILY HISTORY: Family History  Problem Relation Age of Onset   Alcohol abuse Other    Colon cancer Brother 72       Died Oct 16, 2013   Diabetes Mellitus II Mother    Esophageal cancer Neg Hx    Rectal cancer Neg Hx    Stomach cancer Neg Hx     ALLERGIES:  has no known allergies.  MEDICATIONS:  Current Outpatient Medications  Medication Sig Dispense Refill   acyclovir (ZOVIRAX) 400 MG tablet Take 1 tablet (400 mg total) by mouth daily. 30 tablet 5   allopurinol (ZYLOPRIM) 300 MG tablet Take 0.5 tablets (150 mg total) by mouth daily. 30 tablet 0   Blood Glucose Monitoring Suppl (ONE TOUCH ULTRA 2) W/DEVICE KIT See admin instructions.  0   Cholecalciferol 50 MCG (2000 UT) TABS TAKE ONE TABLET BY MOUTH DAILY FOR LOW VITAMIN D.     doxepin (SINEQUAN) 25 MG capsule  Take 1 capsule by mouth at bedtime.     empagliflozin (JARDIANCE) 25 MG TABS tablet Take 0.5 tablets by mouth every morning. Only if blood sugars get too high     finasteride (PROPECIA) 1 MG tablet Take 1 tablet by mouth daily.     finasteride (PROSCAR) 5 MG tablet Take 0.5 tablets by mouth daily.     Fluocin-Hydroquinone-Tretinoin (TRI-LUMA) 0.01-4-0.05 % CREA Apply 1 application. topically at bedtime. (Patient not taking: Reported on 04/20/2023) 30 g 2   Insulin Glargine (LANTUS SOLOSTAR) 100 UNIT/ML Solostar Pen INJECT 20 UNITS SUBCUTANEOUSLY AT BEDTIME 3 pen 11   INSULIN GLARGINE, 1 UNIT DIAL, [place] INJECT 20 UNITS SUBCUTANEOUSLY DAILY (Patient not taking: Reported on 04/20/2023)  insulin glargine-yfgn (SEMGLEE) 100 UNIT/ML Pen INJECT 22 UNITS SUBCUTANEOUSLY AT BEDTIME (USE WITHIN 28 DAYS AFTER OPENING PEN) (CONVERTED FROM LANTUS)     lisinopril (PRINIVIL,ZESTRIL) 40 MG tablet Take 1 tablet (40 mg total) by mouth daily. 90 tablet 4   meloxicam (MOBIC) 7.5 MG tablet TAKE 1 TABLET (7.5 MG TOTAL) BY MOUTH DAILY. (Patient taking differently: TAKE 1 TABLET (7.5 MG TOTAL) BY MOUTH DAILY. As needed) 30 tablet 1   ondansetron (ZOFRAN) 8 MG tablet Take 1 tablet (8 mg total) by mouth every 8 (eight) hours as needed for nausea or vomiting. 30 tablet 1   ONE TOUCH ULTRA TEST test strip TEST GLUCOSE 3 TIMES A DAY 300 each 1   ONETOUCH DELICA LANCETS 33G MISC TEST GLUCOSE 3 TIMES A DAY 300 each 1   prochlorperazine (COMPAZINE) 10 MG tablet Take 1 tablet (10 mg total) by mouth every 6 (six) hours as needed for nausea or vomiting. 30 tablet 1   sildenafil (REVATIO) 20 MG tablet Take 1 to 2 tabs 2 - 3 hours before sex 30 tablet 11   simvastatin (ZOCOR) 20 MG tablet TAKE 1 TABLET (20 MG TOTAL) BY MOUTH AT BEDTIME. 100 tablet 4   venetoclax (VENCLEXTA) 10 & 50 & 100 MG Starter Pack Take by mouth daily. Take 20 mg for 7 days, then 50 mg daily x 7d, then 100 mg daily x 7d, then 200 mg daily x 7d. Take with food &  water. 42 tablet 0   No current facility-administered medications for this visit.    REVIEW OF SYSTEMS:    10 Point review of Systems was done is negative except as noted above.   PHYSICAL EXAMINATION: ECOG PERFORMANCE STATUS: 1 - Symptomatic but completely ambulatory .Marland KitchenThere were no vitals taken for this visit.   GENERAL:alert, in no acute distress and comfortable SKIN: no acute rashes, no significant lesions EYES: conjunctiva are pink and non-injected, sclera anicteric OROPHARYNX: MMM, no exudates, no oropharyngeal erythema or ulceration NECK: supple, no JVD LYMPH:  no palpable lymphadenopathy in the cervical, axillary or inguinal regions LUNGS: clear to auscultation b/l with normal respiratory effort HEART: regular rate & rhythm ABDOMEN:  normoactive bowel sounds , non tender, not distended. Extremity: no pedal edema PSYCH: alert & oriented x 3 with fluent speech NEURO: no focal motor/sensory deficits   LABORATORY DATA:  I have reviewed the data as listed .    Latest Ref Rng & Units 05/12/2023    8:17 AM 04/27/2023    9:35 AM 04/20/2023    8:39 AM  CBC  WBC 4.0 - 10.5 K/uL 3.2  3.6  6.1   Hemoglobin 13.0 - 17.0 g/dL 82.9  56.2  13.0   Hematocrit 39.0 - 52.0 % 32.1  31.3  32.1   Platelets 150 - 400 K/uL 106  114  131    .CMP     Component Value Date/Time   NA 138 04/27/2023 0935   NA 142 01/01/2017 0809   K 3.6 04/27/2023 0935   K 4.4 01/01/2017 0809   CL 104 04/27/2023 0935   CL 102 04/25/2012 1003   CO2 26 04/27/2023 0935   CO2 26 01/01/2017 0809   GLUCOSE 56 (L) 04/27/2023 0935   GLUCOSE 89 01/01/2017 0809   GLUCOSE 170 (H) 04/25/2012 1003   BUN 8 04/27/2023 0935   BUN 15.7 01/01/2017 0809   CREATININE 1.10 04/27/2023 0935   CREATININE 0.9 01/01/2017 0809   CALCIUM 9.3 04/27/2023 0935   CALCIUM 9.1 01/01/2017  0809   PROT 6.6 04/27/2023 0935   PROT 7.0 01/01/2017 0809   ALBUMIN 4.2 04/27/2023 0935   ALBUMIN 4.2 01/01/2017 0809   AST 22 04/27/2023 0935    AST 22 01/01/2017 0809   ALT 10 04/27/2023 0935   ALT 19 01/01/2017 0809   ALKPHOS 69 04/27/2023 0935   ALKPHOS 68 01/01/2017 0809   BILITOT 1.0 04/27/2023 0935   BILITOT 0.75 01/01/2017 0809   GFRNONAA >60 04/27/2023 0935   GFRAA >60 07/25/2019 0932   .    Latest Ref Rng & Units 04/27/2023    9:35 AM 04/20/2023    8:39 AM 04/05/2023    7:52 AM  CMP  Glucose 70 - 99 mg/dL 56  161  91   BUN 8 - 23 mg/dL 8  18  24    Creatinine 0.61 - 1.24 mg/dL 0.96  0.45  4.09   Sodium 135 - 145 mmol/L 138  140  142   Potassium 3.5 - 5.1 mmol/L 3.6  4.6  4.3   Chloride 98 - 111 mmol/L 104  106  108   CO2 22 - 32 mmol/L 26  27  27    Calcium 8.9 - 10.3 mg/dL 9.3  8.7  9.5   Total Protein 6.5 - 8.1 g/dL 6.6  6.4  6.9   Total Bilirubin 0.0 - 1.2 mg/dL 1.0  0.8  0.9   Alkaline Phos 38 - 126 U/L 69  80  100   AST 15 - 41 U/L 22  15  17    ALT 0 - 44 U/L 10  10  10     . RADIOGRAPHIC STUDIES: I have personally reviewed the radiological images as listed and agreed with the findings in the report. No results found.   ASSESSMENT & PLAN:   68 year old man with:      1.  CLL /SLL diagnosed in 2012 after presenting with stage IIIA, leukocytosis and lymphadenopathy.  He is currently on Calquence with continued response in his white cell count continues to drop appropriately. His hemoglobin and platelet count has also nearly normalized at this time.   2.  Lymphadenopathy: Related to his lymphoproliferative disorder with cervical adenopathy.  This has nearly resolved at this time.   3.  Tumor lysis syndrome: He is at low risk at this time despite his bulky disease and will continue to monitor his electrolytes for the time being.  PLAN:  -Discussed lab results on 05/12/23 in detail with patient. CBC showed WBC of 3.2K, hemoglobin of 10.7, and platelets of 106K. -WBC stable -Neutrophils 1600 -platelets slightly low, though stable and not concerning -CMP stable with no high potassium -there is no  uncontrolled tumor lysis at this time -Patient has been tolerating his treatment well with no new or major toxicities. He reports mild intermittent diarrhea.  -patient is appropriate to proceed with cycle two of Gazyva treatment -plan to add venetoclax with cycle 3 of treatment -discussed that even if he receives Venetoclax through the [place] prior to his next visit with [place], he should not start it until we reconnect in 1 month -discussed that once he is started on Venetoclax, there would be a need for labs once a week -educated patient that venetoclax causes a lot of cancer cells to break down very quickly, producing a lot of uric acid.  -recommend patient to drink 2L of water daily -patient shall return to clinic in 1 month  -answered all of patient's questions in detail  FOLLOW-UP: ***  The  total time spent in the appointment was *** minutes* .  All of the patient's questions were answered with apparent satisfaction. The patient knows to call the clinic with any problems, questions or concerns.   Wyvonnia Lora MD MS AAHIVMS [facility] Hematology/Oncology Physician [facility]  .*Total Encounter Time as defined by the Centers for Medicare and Medicaid Services includes, in addition to the face-to-face time of a patient visit (documented in the note above) non-face-to-face time: obtaining and reviewing outside history, ordering and reviewing medications, tests or procedures, care coordination (communications with other health care professionals or caregivers) and documentation in the medical record.    I,Mitra Faeizi,acting as a Neurosurgeon for Wyvonnia Lora, MD.,have documented all relevant documentation on the behalf of Wyvonnia Lora, MD,as directed by  Wyvonnia Lora, MD while in the presence of Wyvonnia Lora, MD.  ***

## 2023-05-12 NOTE — Patient Instructions (Signed)
 [facility] - A DEPT OF [facility]  Discharge Instructions: Thank you for choosing [facility] to provide your oncology and hematology care.   If you have a lab appointment with the Cancer Center, please go directly to the Cancer Center and check in at the registration area.   Wear comfortable clothing and clothing appropriate for easy access to any Portacath or PICC line.   We strive to give you quality time with your provider. You may need to reschedule your appointment if you arrive late (15 or more minutes).  Arriving late affects you and other patients whose appointments are after yours.  Also, if you miss three or more appointments without notifying the office, you may be dismissed from the clinic at the provider's discretion.      For prescription refill requests, have your pharmacy contact our office and allow 72 hours for refills to be completed.    Today you received the following chemotherapy and/or immunotherapy agents Gazyva   To help prevent nausea and vomiting after your treatment, we encourage you to take your nausea medication as directed.  BELOW ARE SYMPTOMS THAT SHOULD BE REPORTED IMMEDIATELY: *FEVER GREATER THAN 100.4 F (38 C) OR HIGHER *CHILLS OR SWEATING *NAUSEA AND VOMITING THAT IS NOT CONTROLLED WITH YOUR NAUSEA MEDICATION *UNUSUAL SHORTNESS OF BREATH *UNUSUAL BRUISING OR BLEEDING *URINARY PROBLEMS (pain or burning when urinating, or frequent urination) *BOWEL PROBLEMS (unusual diarrhea, constipation, pain near the anus) TENDERNESS IN MOUTH AND THROAT WITH OR WITHOUT PRESENCE OF ULCERS (sore throat, sores in mouth, or a toothache) UNUSUAL RASH, SWELLING OR PAIN  UNUSUAL VAGINAL DISCHARGE OR ITCHING   Items with * indicate a potential emergency and should be followed up as soon as possible or go to the Emergency Department if any problems should occur.  Please show the CHEMOTHERAPY ALERT CARD or IMMUNOTHERAPY ALERT  CARD at check-in to the Emergency Department and triage nurse.  Should you have questions after your visit or need to cancel or reschedule your appointment, please contact [facility] - A DEPT OF [facility]  Dept: [phone]  and follow the prompts.  Office hours are 8:00 a.m. to 4:30 p.m. Monday - Friday. Please note that voicemails left after 4:00 p.m. may not be returned until the following business day.  We are closed weekends and major holidays. You have access to a nurse at all times for urgent questions. Please call the main number to the clinic Dept: [phone] and follow the prompts.   For any non-urgent questions, you may also contact your provider using MyChart. We now offer e-Visits for anyone 76 and older to request care online for non-urgent symptoms. For details visit [email or web address].   Also download the MyChart app! Go to the app store, search "MyChart", open the app, select [place], and log in with your MyChart username and password.

## 2023-05-18 ENCOUNTER — Encounter: Payer: Self-pay | Admitting: Hematology

## 2023-05-27 NOTE — Telephone Encounter (Signed)
 Called [facility] to check on status of Venclexta. Informed by representative that medication has shipped and that patient should receive on Monday, 05/31/23.    Hansel Ley, CPhT Pharmacy Technician Coordinator [facility] [phone] (Ph) 05/27/2023 10:02 AM

## 2023-05-27 NOTE — Telephone Encounter (Signed)
 Oral Oncology Pharmacist Encounter   Called patient to notify him to hold on to the venetoclax when he receives it on Monday, 05/31/23 and to not start it until we give him the day he will start. I notified patient we will discuss the medication in full next week to go over any and all questions he may have as well as how to take the medication. Patient appreciated the call and knows to wait before starting.  Arthor Gorter, PharmD Hematology/Oncology Clinical Pharmacist [facility] [phone]

## 2023-06-07 MED FILL — Dexamethasone Sodium Phosphate Inj 100 MG/10ML: INTRAMUSCULAR | Qty: 2 | Status: AC

## 2023-06-08 ENCOUNTER — Inpatient Hospital Stay

## 2023-06-08 ENCOUNTER — Inpatient Hospital Stay (HOSPITAL_BASED_OUTPATIENT_CLINIC_OR_DEPARTMENT_OTHER): Admitting: Hematology

## 2023-06-08 VITALS — BP 101/65 | HR 72 | Temp 97.2°F | Resp 16

## 2023-06-08 VITALS — BP 109/53 | HR 97 | Temp 97.4°F | Resp 16 | Ht 68.0 in | Wt 141.8 lb

## 2023-06-08 DIAGNOSIS — C9110 Chronic lymphocytic leukemia of B-cell type not having achieved remission: Secondary | ICD-10-CM

## 2023-06-08 DIAGNOSIS — Z5112 Encounter for antineoplastic immunotherapy: Secondary | ICD-10-CM | POA: Diagnosis not present

## 2023-06-08 LAB — CBC WITH DIFFERENTIAL (CANCER CENTER ONLY)
Abs Immature Granulocytes: 0.05 10*3/uL (ref 0.00–0.07)
Basophils Absolute: 0 10*3/uL (ref 0.0–0.1)
Basophils Relative: 1 %
Eosinophils Absolute: 0 10*3/uL (ref 0.0–0.5)
Eosinophils Relative: 1 %
HCT: 26.4 % — ABNORMAL LOW (ref 39.0–52.0)
Hemoglobin: 8.9 g/dL — ABNORMAL LOW (ref 13.0–17.0)
Immature Granulocytes: 1 %
Lymphocytes Relative: 26 %
Lymphs Abs: 1 10*3/uL (ref 0.7–4.0)
MCH: 31.3 pg (ref 26.0–34.0)
MCHC: 33.7 g/dL (ref 30.0–36.0)
MCV: 93 fL (ref 80.0–100.0)
Monocytes Absolute: 0.2 10*3/uL (ref 0.1–1.0)
Monocytes Relative: 6 %
Neutro Abs: 2.7 10*3/uL (ref 1.7–7.7)
Neutrophils Relative %: 65 %
Platelet Count: 117 10*3/uL — ABNORMAL LOW (ref 150–400)
RBC: 2.84 MIL/uL — ABNORMAL LOW (ref 4.22–5.81)
RDW: 15.8 % — ABNORMAL HIGH (ref 11.5–15.5)
WBC Count: 4 10*3/uL (ref 4.0–10.5)
nRBC: 0 % (ref 0.0–0.2)

## 2023-06-08 LAB — CMP (CANCER CENTER ONLY)
ALT: 5 U/L (ref 0–44)
AST: 13 U/L — ABNORMAL LOW (ref 15–41)
Albumin: 4.1 g/dL (ref 3.5–5.0)
Alkaline Phosphatase: 75 U/L (ref 38–126)
Anion gap: 9 (ref 5–15)
BUN: 14 mg/dL (ref 8–23)
CO2: 26 mmol/L (ref 22–32)
Calcium: 8.7 mg/dL — ABNORMAL LOW (ref 8.9–10.3)
Chloride: 103 mmol/L (ref 98–111)
Creatinine: 1.02 mg/dL (ref 0.61–1.24)
GFR, Estimated: >60 mL/min (ref >60)
Glucose, Bld: 86 mg/dL (ref 70–99)
Potassium: 4 mmol/L (ref 3.5–5.1)
Sodium: 138 mmol/L (ref 135–145)
Total Bilirubin: 1.2 mg/dL (ref 0.0–1.2)
Total Protein: 6.5 g/dL (ref 6.5–8.1)

## 2023-06-08 MED ORDER — DIPHENHYDRAMINE HCL 50 MG/ML IJ SOLN
50.0000 mg | Freq: Once | INTRAMUSCULAR | Status: AC
  Administered 2023-06-08: 50 mg via INTRAVENOUS
  Filled 2023-06-08: qty 1

## 2023-06-08 MED ORDER — FAMOTIDINE IN NACL 20-0.9 MG/50ML-% IV SOLN
20.0000 mg | Freq: Once | INTRAVENOUS | Status: AC
  Administered 2023-06-08: 20 mg via INTRAVENOUS
  Filled 2023-06-08: qty 50

## 2023-06-08 MED ORDER — MONTELUKAST SODIUM 10 MG PO TABS
10.0000 mg | ORAL_TABLET | Freq: Once | ORAL | Status: AC
  Administered 2023-06-08: 10 mg via ORAL
  Filled 2023-06-08: qty 1

## 2023-06-08 MED ORDER — SODIUM CHLORIDE 0.9 % IV SOLN
20.0000 mg | Freq: Once | INTRAVENOUS | Status: AC
  Administered 2023-06-08: 20 mg via INTRAVENOUS
  Filled 2023-06-08: qty 20

## 2023-06-08 MED ORDER — SODIUM CHLORIDE 0.9 % IV SOLN
1000.0000 mg | Freq: Once | INTRAVENOUS | Status: AC
  Administered 2023-06-08: 1000 mg via INTRAVENOUS
  Filled 2023-06-08: qty 40

## 2023-06-08 MED ORDER — SODIUM CHLORIDE 0.9% FLUSH: 10.0000 mL | INTRAVENOUS | Status: DC | PRN

## 2023-06-08 MED ORDER — SODIUM CHLORIDE 0.9 % IV SOLN: INTRAVENOUS | Status: DC

## 2023-06-08 MED ORDER — ACETAMINOPHEN 500 MG PO TABS
1000.0000 mg | ORAL_TABLET | Freq: Once | ORAL | Status: AC
  Administered 2023-06-08: 1000 mg via ORAL
  Filled 2023-06-08: qty 2

## 2023-06-08 MED ORDER — HEPARIN SOD (PORK) LOCK FLUSH 100 UNIT/ML IV SOLN: 500.0000 [IU] | Freq: Once | INTRAVENOUS | Status: DC | PRN

## 2023-06-08 NOTE — Patient Instructions (Signed)
 [facility] - A DEPT OF [facility]  Discharge Instructions: Thank you for choosing [facility] to provide your oncology and hematology care.   If you have a lab appointment with the Cancer Center, please go directly to the Cancer Center and check in at the registration area.   Wear comfortable clothing and clothing appropriate for easy access to any Portacath or PICC line.   We strive to give you quality time with your provider. You may need to reschedule your appointment if you arrive late (15 or more minutes).  Arriving late affects you and other patients whose appointments are after yours.  Also, if you miss three or more appointments without notifying the office, you may be dismissed from the clinic at the provider's discretion.      For prescription refill requests, have your pharmacy contact our office and allow 72 hours for refills to be completed.    Today you received the following chemotherapy and/or immunotherapy agents Gazyva   To help prevent nausea and vomiting after your treatment, we encourage you to take your nausea medication as directed.  BELOW ARE SYMPTOMS THAT SHOULD BE REPORTED IMMEDIATELY: *FEVER GREATER THAN 100.4 F (38 C) OR HIGHER *CHILLS OR SWEATING *NAUSEA AND VOMITING THAT IS NOT CONTROLLED WITH YOUR NAUSEA MEDICATION *UNUSUAL SHORTNESS OF BREATH *UNUSUAL BRUISING OR BLEEDING *URINARY PROBLEMS (pain or burning when urinating, or frequent urination) *BOWEL PROBLEMS (unusual diarrhea, constipation, pain near the anus) TENDERNESS IN MOUTH AND THROAT WITH OR WITHOUT PRESENCE OF ULCERS (sore throat, sores in mouth, or a toothache) UNUSUAL RASH, SWELLING OR PAIN  UNUSUAL VAGINAL DISCHARGE OR ITCHING   Items with * indicate a potential emergency and should be followed up as soon as possible or go to the Emergency Department if any problems should occur.  Please show the CHEMOTHERAPY ALERT CARD or IMMUNOTHERAPY ALERT  CARD at check-in to the Emergency Department and triage nurse.  Should you have questions after your visit or need to cancel or reschedule your appointment, please contact [facility] - A DEPT OF [facility]  Dept: [phone]  and follow the prompts.  Office hours are 8:00 a.m. to 4:30 p.m. Monday - Friday. Please note that voicemails left after 4:00 p.m. may not be returned until the following business day.  We are closed weekends and major holidays. You have access to a nurse at all times for urgent questions. Please call the main number to the clinic Dept: [phone] and follow the prompts.   For any non-urgent questions, you may also contact your provider using MyChart. We now offer e-Visits for anyone 76 and older to request care online for non-urgent symptoms. For details visit [email or web address].   Also download the MyChart app! Go to the app store, search "MyChart", open the app, select [place], and log in with your MyChart username and password.

## 2023-06-08 NOTE — Progress Notes (Signed)
 Patient seen by Dr. Addison Naegeli are within treatment parameters.  Labs reviewed: and are within treatment parameters.  Per physician team, patient is ready for treatment and there are NO modifications to the treatment plan.

## 2023-06-08 NOTE — Progress Notes (Signed)
 HEMATOLOGY/ONCOLOGY CLINIC NOTE  Date of Service: 06/08/2023  Patient Care Team: Milda Aline, MD as PCP - General (Family Medicine) Prescott Brodie, MD (Inactive)  CHIEF COMPLAINTS/PURPOSE OF CONSULTATION:  Evaluation and management of CLL  DIAGNOSIS: 68 year old man with CLL diagnosed in 2012 after presenting with lymphocytosis and adenopathy and stage IIIA disease.   Prior therapy:    Bendamustine  and rituximab  started in July 2017 and received total of 2 cycles. Therapy discontinued in August 2017 based on patient's wishes after achieving partial response.   He developed a relapse disease in April 2019. Ibrutinib  420 mg daily started in April 2019 and discontinued in September 2023 after poor adherence.   CURRENT THERAPY: Calquence  100 mg twice daily started in September 2023.  HISTORY OF PRESENTING ILLNESS:  Tyler Scott is a wonderful 68 y.o. male who has been a previous patient of Dr. Dirk Fredericks. He is here for evaluation and management of CLL.   Patient was last seen by Dr. Dirk Fredericks on 02/18/2022 and was doing well overall with no new medical concerns.  Today, he reports that he has been on and off different treatment since 2017, but has been on Calquence  since September, 2023. He does admit that he does forget to take this medication a couple times a week. He reports that his previous treatment of Ibrutinib  did cause him diarrhea.  He did receive two previous injection in his buttocks, but is unsure it is was steroids or antibiotics. Patient is UTD with influenza, RSV, and COVID-19 vaccinations. He denies any abdominal pain, but does note some discomfort along the spleen. He denies any bleeding issues, chest pain, diarrhea, resolved lumps/bumps in neck, infection issues, fevers, or chills.  He does note some SOB related to allergies and chest/sinus congestion. His symptoms are improving. He also reports that his neck edema had improved. He does complain of  sleep deprivation/poor sleep habits and notes some stress. He does typically sleep during the day.  He does take vitamin D and C supplements regularly. On average, he does drink 1 glass of wine a day and denies any major medication changes.  INTERVAL HISTORY:  Tyler Scott is a wonderful 68 y.o. male here for continued evaluation and management of CLL. Patient is scheduled for cycle 3 day 1 of his Gazyva  treatment.  Patient was last seen by me on 05/12/2023 and he complained of mild lower extremity swelling, intermittent diarrhea, mild night sweats, and mild fatigue.   Patient notes he has been doing well overall since our last visit. He denies any new infection issues, fever, chills, unexpected weight loss, back pain, chest pain, abdominal pain, or abnormal bowel movement. He does complain of night sweats and mild bilateral leg swelling. However, patient notes that his leg swelling has been improving.   He complains of enlarged lymph nodes near his groin area and testicular swelling. Patient notes he went to the Urgent care. He was diagnosed with an infection and was prescribed antibiotics and pain medication. During this visit, he notes that his testicular swelling has slightly improved, but his left testicular pain has completely resolved. He denies hematuria or abnormal urinary symptoms. He did not complete his antibiotic course.   Patient notes he has been tolerating his Gazyva  treatment well without any new or severe toxicities. He has not started his Venetoclax  since our last visit.    MEDICAL HISTORY:  Past Medical History:  Diagnosis Date   Allergy    Arthritis    CLL (  chronic lymphocytic leukemia) (HCC)    stage III: asymptomatic; on observation   DIABETES MELLITUS, TYPE I, UNCONTROLLED 12/30/2006   insulin    Diabetic retinopathy    ERECTILE DYSFUNCTION, ORGANIC 12/30/2006   Hx of adenomatous polyp of colon 05/24/2014   HYPERLIPIDEMIA 12/30/2006   HYPERTENSION 12/30/2006    LOW BACK PAIN, ACUTE 12/21/2006    SURGICAL HISTORY: Past Surgical History:  Procedure Laterality Date   COLONOSCOPY     great right toe- Fused the I P joint      SOCIAL HISTORY: Social History   Socioeconomic History   Marital status: Married    Spouse name: Not on file   Number of children: Not on file   Years of education: Not on file   Highest education level: Not on file  Occupational History   Not on file  Tobacco Use   Smoking status: Never   Smokeless tobacco: Never  Vaping Use   Vaping status: Never Used  Substance and Sexual Activity   Alcohol use: Yes    Alcohol/week: 4.0 standard drinks of alcohol    Types: 4 Glasses of wine per week   Drug use: No   Sexual activity: Not on file    Comment: regular exercise - yes  Other Topics Concern   Not on file  Social History Narrative   Not on file   Social Drivers of Health   Financial Resource Strain: Not on file  Food Insecurity: Not on file  Transportation Needs: Not on file  Physical Activity: Not on file  Stress: Not on file  Social Connections: Unknown (06/12/2021)   Received from [facility], [facility]   Social Network    Social Network: Not on file  Intimate Partner Violence: Unknown (05/16/2021)   Received from [facility], [facility]   HITS    Physically Hurt: Not on file    Insult or Talk Down To: Not on file    Threaten Physical Harm: Not on file    Scream or Curse: Not on file    FAMILY HISTORY: Family History  Problem Relation Age of Onset   Alcohol abuse Other    Colon cancer Brother 71       Died 10-12-2013   Diabetes Mellitus II Mother    Esophageal cancer Neg Hx    Rectal cancer Neg Hx    Stomach cancer Neg Hx     ALLERGIES:  has no known allergies.  MEDICATIONS:  Current Outpatient Medications  Medication Sig Dispense Refill   acyclovir  (ZOVIRAX ) 400 MG tablet Take 1 tablet (400 mg total) by mouth daily. 30 tablet 5   allopurinol  (ZYLOPRIM ) 300 MG tablet Take 0.5  tablets (150 mg total) by mouth daily. 30 tablet 0   Blood Glucose Monitoring Suppl (ONE TOUCH ULTRA 2) W/DEVICE KIT See admin instructions.  0   Cholecalciferol 50 MCG (2000 UT) TABS TAKE ONE TABLET BY MOUTH DAILY FOR LOW VITAMIN D.     doxepin  (SINEQUAN ) 25 MG capsule Take 1 capsule by mouth at bedtime.     empagliflozin (JARDIANCE) 25 MG TABS tablet Take 0.5 tablets by mouth every morning. Only if blood sugars get too high     finasteride (PROPECIA) 1 MG tablet Take 1 tablet by mouth daily.     finasteride (PROSCAR) 5 MG tablet Take 0.5 tablets by mouth daily.     Fluocin-Hydroquinone-Tretinoin (TRI-LUMA ) 0.01-4-0.05 % CREA Apply 1 application. topically at bedtime. (Patient not taking: Reported on 04/20/2023) 30 g 2  Insulin  Glargine (LANTUS  SOLOSTAR) 100 UNIT/ML Solostar Pen INJECT 20 UNITS SUBCUTANEOUSLY AT BEDTIME 3 pen 11   INSULIN  GLARGINE, 1 UNIT DIAL, [place] INJECT 20 UNITS SUBCUTANEOUSLY DAILY (Patient not taking: Reported on 04/20/2023)     insulin  glargine-yfgn (SEMGLEE ) 100 UNIT/ML Pen INJECT 22 UNITS SUBCUTANEOUSLY AT BEDTIME (USE WITHIN 28 DAYS AFTER OPENING PEN) (CONVERTED FROM LANTUS )     lisinopril  (PRINIVIL ,ZESTRIL ) 40 MG tablet Take 1 tablet (40 mg total) by mouth daily. 90 tablet 4   meloxicam  (MOBIC ) 7.5 MG tablet TAKE 1 TABLET (7.5 MG TOTAL) BY MOUTH DAILY. (Patient taking differently: TAKE 1 TABLET (7.5 MG TOTAL) BY MOUTH DAILY. As needed) 30 tablet 1   ondansetron  (ZOFRAN ) 8 MG tablet Take 1 tablet (8 mg total) by mouth every 8 (eight) hours as needed for nausea or vomiting. 30 tablet 1   ONE TOUCH ULTRA TEST test strip TEST GLUCOSE 3 TIMES A DAY 300 each 1   ONETOUCH DELICA LANCETS 33G MISC TEST GLUCOSE 3 TIMES A DAY 300 each 1   prochlorperazine  (COMPAZINE ) 10 MG tablet Take 1 tablet (10 mg total) by mouth every 6 (six) hours as needed for nausea or vomiting. 30 tablet 1   sildenafil  (REVATIO ) 20 MG tablet Take 1 to 2 tabs 2 - 3 hours before sex 30 tablet 11   simvastatin   (ZOCOR ) 20 MG tablet TAKE 1 TABLET (20 MG TOTAL) BY MOUTH AT BEDTIME. 100 tablet 4   venetoclax  (VENCLEXTA ) 10 & 50 & 100 MG Starter Pack Take by mouth daily. Take 20 mg for 7 days, then 50 mg daily x 7d, then 100 mg daily x 7d, then 200 mg daily x 7d. Take with food & water. 42 tablet 0   No current facility-administered medications for this visit.    REVIEW OF SYSTEMS:    10 Point review of Systems was done is negative except as noted above.   PHYSICAL EXAMINATION: ECOG PERFORMANCE STATUS: 1 - Symptomatic but completely ambulatory .Aaron AasBP (!) 109/53 (BP Location: Left Arm, Patient Position: Sitting) Comment: Nurse notified  Pulse 97   Temp (!) 97.4 F (36.3 C) (Oral)   Resp 16   Ht 5\' 8"  (1.727 m)   Wt 141 lb 12.8 oz (64.3 kg)   SpO2 100%   BMI 21.56 kg/m    GENERAL:alert, in no acute distress and comfortable SKIN: no acute rashes, no significant lesions EYES: conjunctiva are pink and non-injected, sclera anicteric OROPHARYNX: MMM, no exudates, no oropharyngeal erythema or ulceration NECK: supple, no JVD LYMPH:  no palpable lymphadenopathy in the cervical, axillary or inguinal regions LUNGS: clear to auscultation b/l with normal respiratory effort HEART: regular rate & rhythm ABDOMEN:  normoactive bowel sounds , non tender, not distended. Extremity: no pedal edema PSYCH: alert & oriented x 3 with fluent speech NEURO: no focal motor/sensory deficits   LABORATORY DATA:  I have reviewed the data as listed .    Latest Ref Rng & Units 06/08/2023    8:05 AM 05/12/2023    8:17 AM 04/27/2023    9:35 AM  CBC  WBC 4.0 - 10.5 K/uL 4.0  3.2  3.6   Hemoglobin 13.0 - 17.0 g/dL 8.9  60.4  54.0   Hematocrit 39.0 - 52.0 % 26.4  32.1  31.3   Platelets 150 - 400 K/uL 117  106  114    .CMP     Component Value Date/Time   NA 138 06/08/2023 0805   NA 142 01/01/2017 0809   K 4.0  06/08/2023 0805   K 4.4 01/01/2017 0809   CL 103 06/08/2023 0805   CL 102 04/25/2012 1003   CO2 26  06/08/2023 0805   CO2 26 01/01/2017 0809   GLUCOSE 86 06/08/2023 0805   GLUCOSE 89 01/01/2017 0809   GLUCOSE 170 (H) 04/25/2012 1003   BUN 14 06/08/2023 0805   BUN 15.7 01/01/2017 0809   CREATININE 1.02 06/08/2023 0805   CREATININE 0.9 01/01/2017 0809   CALCIUM 8.7 (L) 06/08/2023 0805   CALCIUM 9.1 01/01/2017 0809   PROT 6.5 06/08/2023 0805   PROT 7.0 01/01/2017 0809   ALBUMIN 4.1 06/08/2023 0805   ALBUMIN 4.2 01/01/2017 0809   AST 13 (L) 06/08/2023 0805   AST 22 01/01/2017 0809   ALT 5 06/08/2023 0805   ALT 19 01/01/2017 0809   ALKPHOS 75 06/08/2023 0805   ALKPHOS 68 01/01/2017 0809   BILITOT 1.2 06/08/2023 0805   BILITOT 0.75 01/01/2017 0809   GFRNONAA >60 06/08/2023 0805   GFRAA >60 07/25/2019 0932   .    Latest Ref Rng & Units 06/08/2023    8:05 AM 05/12/2023    8:17 AM 04/27/2023    9:35 AM  CMP  Glucose 70 - 99 mg/dL 86  578  56   BUN 8 - 23 mg/dL 14  26  8    Creatinine 0.61 - 1.24 mg/dL 4.69  6.29  5.28   Sodium 135 - 145 mmol/L 138  138  138   Potassium 3.5 - 5.1 mmol/L 4.0  4.5  3.6   Chloride 98 - 111 mmol/L 103  105  104   CO2 22 - 32 mmol/L 26  25  26    Calcium 8.9 - 10.3 mg/dL 8.7  9.4  9.3   Total Protein 6.5 - 8.1 g/dL 6.5  6.7  6.6   Total Bilirubin 0.0 - 1.2 mg/dL 1.2  1.1  1.0   Alkaline Phos 38 - 126 U/L 75  79  69   AST 15 - 41 U/L 13  19  22    ALT 0 - 44 U/L 5  9  10     . RADIOGRAPHIC STUDIES: I have personally reviewed the radiological images as listed and agreed with the findings in the report. No results found.   ASSESSMENT & PLAN:   68 year old man with:      1.  CLL /SLL diagnosed in 2012 after presenting with stage IIIA, leukocytosis and lymphadenopathy.  He is currently on Calquence  with continued response in his white cell count continues to drop appropriately. His hemoglobin and platelet count has also nearly normalized at this time.   2.  Lymphadenopathy: Related to his lymphoproliferative disorder with cervical adenopathy.  This  has nearly resolved at this time.   3.  Tumor lysis syndrome: He is at low risk at this time despite his bulky disease and will continue to monitor his electrolytes for the time being.  Continues to be on allopurinol  for this.  PLAN: -Discussed lab results from today, 06/08/2023, in detail with the patient. CBC shows low Hgb of 8.9 g/dL with Hct of 41.3% and low but improved platelets of 117 K. CMP shows low calcium level of 8.7 mg/dL with slightly low AST level of 13.  -Discussed with the patient that his infection might be causing him to be anemic.  -Wait another month before starting Venetoclax  due to recent infection.  -We would need to continue allopurinol  at least till he starts his venetoclax  and is on the  venetoclax  for 1 to 2 months. -Patient has been tolerating his current Gazyva  treatment well without any new or severe toxicities.  -patient is appropriate to proceed with cycle 3 of Gazyva  treatment -recommend patient to drink 2L of water daily -answered all of patient's questions in detail.  FOLLOW-UP: Please schedule next 3 cycles of Gazyva  every 28 days with labs and MD visits  The total time spent in the appointment was 30 minutes* .  All of the patient's questions were answered with apparent satisfaction. The patient knows to call the clinic with any problems, questions or concerns.   Jacquelyn Matt MD MS AAHIVMS [facility] Hematology/Oncology Physician [facility]  .*Total Encounter Time as defined by the Centers for Medicare and Medicaid Services includes, in addition to the face-to-face time of a patient visit (documented in the note above) non-face-to-face time: obtaining and reviewing outside history, ordering and reviewing medications, tests or procedures, care coordination (communications with other health care professionals or caregivers) and documentation in the medical record.   I,Param Shah,acting as a Neurosurgeon for Jacquelyn Matt, MD.,have documented all  relevant documentation on the behalf of Jacquelyn Matt, MD,as directed by  Jacquelyn Matt, MD while in the presence of Jacquelyn Matt, MD.  .I have reviewed the above documentation for accuracy and completeness, and I agree with the above. .Caitland Porchia Kishore Darlean Warmoth MD

## 2023-06-14 ENCOUNTER — Encounter: Payer: Self-pay | Admitting: Hematology

## 2023-06-21 ENCOUNTER — Telehealth: Payer: Self-pay | Admitting: *Deleted

## 2023-06-21 NOTE — Telephone Encounter (Signed)
 Returned PC to patient, he called earlier stating he saw his PCP today & he has blood in his urine.  Patient states he was prescribed levofloxacin and that he has appointment with a urologist on 06/28/23.  Dr Salomon Cree informed, he has appointment here on 07/06/23.

## 2023-07-02 MED FILL — Dexamethasone Sodium Phosphate Inj 100 MG/10ML: INTRAMUSCULAR | Qty: 2 | Status: AC

## 2023-07-06 ENCOUNTER — Inpatient Hospital Stay

## 2023-07-06 ENCOUNTER — Inpatient Hospital Stay: Attending: Hematology

## 2023-07-06 ENCOUNTER — Inpatient Hospital Stay: Attending: Hematology | Admitting: Hematology

## 2023-07-06 VITALS — BP 116/55 | HR 99 | Temp 97.3°F | Resp 18 | Wt 146.7 lb

## 2023-07-06 DIAGNOSIS — D696 Thrombocytopenia, unspecified: Secondary | ICD-10-CM

## 2023-07-06 DIAGNOSIS — Z79899 Other long term (current) drug therapy: Secondary | ICD-10-CM | POA: Insufficient documentation

## 2023-07-06 DIAGNOSIS — N453 Epididymo-orchitis: Secondary | ICD-10-CM

## 2023-07-06 DIAGNOSIS — C9110 Chronic lymphocytic leukemia of B-cell type not having achieved remission: Secondary | ICD-10-CM

## 2023-07-06 LAB — CBC WITH DIFFERENTIAL (CANCER CENTER ONLY)
Abs Immature Granulocytes: 0.1 10*3/uL — ABNORMAL HIGH (ref 0.00–0.07)
Basophils Absolute: 0 10*3/uL (ref 0.0–0.1)
Basophils Relative: 1 %
Eosinophils Absolute: 0 10*3/uL (ref 0.0–0.5)
Eosinophils Relative: 1 %
HCT: 26.2 % — ABNORMAL LOW (ref 39.0–52.0)
Hemoglobin: 8.5 g/dL — ABNORMAL LOW (ref 13.0–17.0)
Immature Granulocytes: 2 %
Lymphocytes Relative: 41 %
Lymphs Abs: 2 10*3/uL (ref 0.7–4.0)
MCH: 31.3 pg (ref 26.0–34.0)
MCHC: 32.4 g/dL (ref 30.0–36.0)
MCV: 96.3 fL (ref 80.0–100.0)
Monocytes Absolute: 0.3 10*3/uL (ref 0.1–1.0)
Monocytes Relative: 7 %
Neutro Abs: 2.3 10*3/uL (ref 1.7–7.7)
Neutrophils Relative %: 48 %
Platelet Count: 100 10*3/uL — ABNORMAL LOW (ref 150–400)
RBC: 2.72 MIL/uL — ABNORMAL LOW (ref 4.22–5.81)
RDW: 18.3 % — ABNORMAL HIGH (ref 11.5–15.5)
WBC Count: 4.8 10*3/uL (ref 4.0–10.5)
nRBC: 1.3 % — ABNORMAL HIGH (ref 0.0–0.2)

## 2023-07-06 LAB — CMP (CANCER CENTER ONLY)
ALT: 5 U/L (ref 0–44)
AST: 16 U/L (ref 15–41)
Albumin: 4.2 g/dL (ref 3.5–5.0)
Alkaline Phosphatase: 84 U/L (ref 38–126)
Anion gap: 10 (ref 5–15)
BUN: 17 mg/dL (ref 8–23)
CO2: 26 mmol/L (ref 22–32)
Calcium: 8.9 mg/dL (ref 8.9–10.3)
Chloride: 105 mmol/L (ref 98–111)
Creatinine: 0.98 mg/dL (ref 0.61–1.24)
GFR, Estimated: >60 mL/min (ref >60)
Glucose, Bld: 51 mg/dL — ABNORMAL LOW (ref 70–99)
Potassium: 4 mmol/L (ref 3.5–5.1)
Sodium: 141 mmol/L (ref 135–145)
Total Bilirubin: 1.5 mg/dL — ABNORMAL HIGH (ref 0.0–1.2)
Total Protein: 6.7 g/dL (ref 6.5–8.1)

## 2023-07-06 NOTE — Telephone Encounter (Signed)
 Oncology Pharmacist Encounter  Spoke to MD and patient is holding treatment today and will not start the venetoclax  this cycle. Will hold off on counseling at this time.   Katelan Hirt, PharmD Hematology/Oncology Clinical Pharmacist [facility] [phone]

## 2023-07-06 NOTE — Progress Notes (Signed)
 HEMATOLOGY/ONCOLOGY CLINIC NOTE  Date of Service: 07/06/2023  Patient Care Team: Milda Aline, MD as PCP - General (Family Medicine) Prescott Brodie, MD (Inactive)  CHIEF COMPLAINTS/PURPOSE OF CONSULTATION:  Evaluation and management of CLL  DIAGNOSIS: 68 year old man with CLL diagnosed in 2012 after presenting with lymphocytosis and adenopathy and stage IIIA disease.   Prior therapy:    Bendamustine  and rituximab  started in July 2017 and received total of 2 cycles. Therapy discontinued in August 2017 based on patient's wishes after achieving partial response.   He developed a relapse disease in April 2019. Ibrutinib  420 mg daily started in April 2019 and discontinued in September 2023 after poor adherence.   CURRENT THERAPY: Calquence  100 mg twice daily started in September 2023.  HISTORY OF PRESENTING ILLNESS:  Tyler Scott is a wonderful 68 y.o. male who has been a previous patient of Dr. Dirk Fredericks. He is here for evaluation and management of CLL.   Patient was last seen by Dr. Dirk Fredericks on 02/18/2022 and was doing well overall with no new medical concerns.  Today, he reports that he has been on and off different treatment since 2017, but has been on Calquence  since September, 2023. He does admit that he does forget to take this medication a couple times a week. He reports that his previous treatment of Ibrutinib  did cause him diarrhea.  He did receive two previous injection in his buttocks, but is unsure it is was steroids or antibiotics. Patient is UTD with influenza, RSV, and COVID-19 vaccinations. He denies any abdominal pain, but does note some discomfort along the spleen. He denies any bleeding issues, chest pain, diarrhea, resolved lumps/bumps in neck, infection issues, fevers, or chills.  He does note some SOB related to allergies and chest/sinus congestion. His symptoms are improving. He also reports that his neck edema had improved. He does complain of  sleep deprivation/poor sleep habits and notes some stress. He does typically sleep during the day.  He does take vitamin D and C supplements regularly. On average, he does drink 1 glass of wine a day and denies any major medication changes.  INTERVAL HISTORY:  Tyler Scott is a wonderful 68 y.o. male here for continued evaluation and management of CLL. Patient is scheduled for cycle 4 day 1 of his Gazyva  treatment.  Patient was last seen by me on 06/08/2023 and complained of night sweats and mild bilateral leg swelling. He also reported enlarged lymph nodes near his groin area and testicular swelling related to an infection.   Patient reports hematuria initially noticed a while ago, which was confirmed on testing. He reports seeing mild blood in his urine. Patient is unsure of how long he has had hematuria. He also complains of dark urine.   He reports that he has not started Acyclovir , Allopurinol , or nausea medication yet.   Recent imaging shows infection of the left testicle and left epididymis. Patient has completed 10 day course of Levofloxacin for for his testicular pain. He notes that he received his US  prior to antibiotics.   He denies any injury or trauma to the testicles. At this time, his testicular pain is mild. His testicular swelling has improved. He denies any discomfort on palpation to the scrotum. He will FU with his urologist.   He has not needed to take much oxycodone for pain management.   He is not taking any Finasteride to shrink the prostate.    He reports that his A1C was elevated recently.  Patient has normal appetite and denies diarrhea, or abdominal pain.   [facility] at [facility].   Patient also follows with urologist at [facility] ([address] [place] [address]), and was most recently seen by him on May 19th. He will return to this urologist in 2 months.   He reports that [facility] will [facility] this [facility].   He reports that he has not been taking acyclovir , allopurinol , or nausea medication. Patient reports that he is unable to take blood thinners.   MEDICAL HISTORY:  Past Medical History:  Diagnosis Date   Allergy    Arthritis    CLL (chronic lymphocytic leukemia) (HCC)    stage III: asymptomatic; on observation   DIABETES MELLITUS, TYPE I, UNCONTROLLED 12/30/2006   insulin    Diabetic retinopathy    ERECTILE DYSFUNCTION, ORGANIC 12/30/2006   Hx of adenomatous polyp of colon 05/24/2014   HYPERLIPIDEMIA 12/30/2006   HYPERTENSION 12/30/2006   LOW BACK PAIN, ACUTE 12/21/2006    SURGICAL HISTORY: Past Surgical History:  Procedure Laterality Date   COLONOSCOPY     great right toe- Fused the I P joint      SOCIAL HISTORY: Social History   Socioeconomic History   Marital status: Married    Spouse name: Not on file   Number of children: Not on file   Years of education: Not on file   Highest education level: Not on file  Occupational History   Not on file  Tobacco Use   Smoking status: Never   Smokeless tobacco: Never  Vaping Use   Vaping status: Never Used  Substance and Sexual Activity   Alcohol use: Yes    Alcohol/week: 4.0 standard drinks of alcohol    Types: 4 Glasses of wine per week   Drug use: No   Sexual activity: Not on file    Comment: regular exercise - yes  Other Topics Concern   Not on file  Social History Narrative   Not on file   Social Drivers of Health   Financial Resource Strain: Not on file  Food Insecurity: Not on file  Transportation Needs: Not on file  Physical Activity: Not on file  Stress: Not on file  Social Connections: Unknown (06/12/2021)   Received from [facility], [facility]   Social Network    Social Network: Not on file  Intimate Partner Violence: Unknown (05/16/2021)   Received from [facility], [facility]   HITS    Physically Hurt: Not on file    Insult or Talk Down To: Not on file     Threaten Physical Harm: Not on file    Scream or Curse: Not on file    FAMILY HISTORY: Family History  Problem Relation Age of Onset   Alcohol abuse Other    Colon cancer Brother 67       Died 09/22/2013   Diabetes Mellitus II Mother    Esophageal cancer Neg Hx    Rectal cancer Neg Hx    Stomach cancer Neg Hx     ALLERGIES:  has no known allergies.  MEDICATIONS:  Current Outpatient Medications  Medication Sig Dispense Refill   acyclovir  (ZOVIRAX ) 400 MG tablet Take 1 tablet (400 mg total) by mouth daily. 30 tablet 5   allopurinol  (ZYLOPRIM ) 300 MG tablet Take 0.5 tablets (150 mg total) by mouth daily. 30 tablet 0   Blood Glucose Monitoring Suppl (ONE TOUCH ULTRA 2) W/DEVICE  KIT See admin instructions.  0   Cholecalciferol 50 MCG (2000 UT) TABS TAKE ONE TABLET BY MOUTH DAILY FOR LOW VITAMIN D.     doxepin  (SINEQUAN ) 25 MG capsule Take 1 capsule by mouth at bedtime.     empagliflozin (JARDIANCE) 25 MG TABS tablet Take 0.5 tablets by mouth every morning. Only if blood sugars get too high     finasteride (PROPECIA) 1 MG tablet Take 1 tablet by mouth daily.     finasteride (PROSCAR) 5 MG tablet Take 0.5 tablets by mouth daily.     Fluocin-Hydroquinone-Tretinoin (TRI-LUMA ) 0.01-4-0.05 % CREA Apply 1 application. topically at bedtime. (Patient not taking: Reported on 04/20/2023) 30 g 2   Insulin  Glargine (LANTUS  SOLOSTAR) 100 UNIT/ML Solostar Pen INJECT 20 UNITS SUBCUTANEOUSLY AT BEDTIME 3 pen 11   INSULIN  GLARGINE, 1 UNIT DIAL, Ranlo INJECT 20 UNITS SUBCUTANEOUSLY DAILY (Patient not taking: Reported on 04/20/2023)     insulin  glargine-yfgn (SEMGLEE ) 100 UNIT/ML Pen INJECT 22 UNITS SUBCUTANEOUSLY AT BEDTIME (USE WITHIN 28 DAYS AFTER OPENING PEN) (CONVERTED FROM LANTUS )     lisinopril  (PRINIVIL ,ZESTRIL ) 40 MG tablet Take 1 tablet (40 mg total) by mouth daily. 90 tablet 4   meloxicam  (MOBIC ) 7.5 MG tablet TAKE 1 TABLET (7.5 MG TOTAL) BY MOUTH DAILY. (Patient taking differently: TAKE 1 TABLET (7.5 MG  TOTAL) BY MOUTH DAILY. As needed) 30 tablet 1   ondansetron  (ZOFRAN ) 8 MG tablet Take 1 tablet (8 mg total) by mouth every 8 (eight) hours as needed for nausea or vomiting. 30 tablet 1   ONE TOUCH ULTRA TEST test strip TEST GLUCOSE 3 TIMES A DAY 300 each 1   ONETOUCH DELICA LANCETS 33G MISC TEST GLUCOSE 3 TIMES A DAY 300 each 1   prochlorperazine  (COMPAZINE ) 10 MG tablet Take 1 tablet (10 mg total) by mouth every 6 (six) hours as needed for nausea or vomiting. 30 tablet 1   sildenafil  (REVATIO ) 20 MG tablet Take 1 to 2 tabs 2 - 3 hours before sex 30 tablet 11   simvastatin  (ZOCOR ) 20 MG tablet TAKE 1 TABLET (20 MG TOTAL) BY MOUTH AT BEDTIME. 100 tablet 4   venetoclax  (VENCLEXTA ) 10 & 50 & 100 MG Starter Pack Take by mouth daily. Take 20 mg for 7 days, then 50 mg daily x 7d, then 100 mg daily x 7d, then 200 mg daily x 7d. Take with food & water. 42 tablet 0   No current facility-administered medications for this visit.    REVIEW OF SYSTEMS:    10 Point review of Systems was done is negative except as noted above.   PHYSICAL EXAMINATION: ECOG PERFORMANCE STATUS: 1 - Symptomatic but completely ambulatory .Aaron AasBP (!) 116/55   Pulse 99   Temp (!) 97.3 F (36.3 C)   Resp 18   Wt 146 lb 11.2 oz (66.5 kg)   SpO2 99%   BMI 22.31 kg/m    GENERAL:alert, in no acute distress and comfortable SKIN: no acute rashes, no significant lesions EYES: conjunctiva are pink and non-injected, sclera anicteric OROPHARYNX: MMM, no exudates, no oropharyngeal erythema or ulceration NECK: supple, no JVD LYMPH:  no palpable lymphadenopathy in the cervical, axillary or inguinal regions LUNGS: clear to auscultation b/l with normal respiratory effort HEART: regular rate & rhythm ABDOMEN:  normoactive bowel sounds , non tender, not distended. Extremity: no pedal edema PSYCH: alert & oriented x 3 with fluent speech NEURO: no focal motor/sensory deficits   LABORATORY DATA:  I have reviewed the data as  listed .    Latest Ref Rng & Units 07/06/2023    8:32 AM 06/08/2023    8:05 AM 05/12/2023    8:17 AM  CBC  WBC 4.0 - 10.5 K/uL 4.8  4.0  3.2   Hemoglobin 13.0 - 17.0 g/dL 8.5  8.9  16.1   Hematocrit 39.0 - 52.0 % 26.2  26.4  32.1   Platelets 150 - 400 K/uL 100  117  106    .CMP     Component Value Date/Time   NA 141 07/06/2023 0832   NA 142 01/01/2017 0809   K 4.0 07/06/2023 0832   K 4.4 01/01/2017 0809   CL 105 07/06/2023 0832   CL 102 04/25/2012 1003   CO2 26 07/06/2023 0832   CO2 26 01/01/2017 0809   GLUCOSE 51 (L) 07/06/2023 0832   GLUCOSE 89 01/01/2017 0809   GLUCOSE 170 (H) 04/25/2012 1003   BUN 17 07/06/2023 0832   BUN 15.7 01/01/2017 0809   CREATININE 0.98 07/06/2023 0832   CREATININE 0.9 01/01/2017 0809   CALCIUM 8.9 07/06/2023 0832   CALCIUM 9.1 01/01/2017 0809   PROT 6.7 07/06/2023 0832   PROT 7.0 01/01/2017 0809   ALBUMIN 4.2 07/06/2023 0832   ALBUMIN 4.2 01/01/2017 0809   AST 16 07/06/2023 0832   AST 22 01/01/2017 0809   ALT 5 07/06/2023 0832   ALT 19 01/01/2017 0809   ALKPHOS 84 07/06/2023 0832   ALKPHOS 68 01/01/2017 0809   BILITOT 1.5 (H) 07/06/2023 0832   BILITOT 0.75 01/01/2017 0809   GFRNONAA >60 07/06/2023 0832   GFRAA >60 07/25/2019 0932   .    Latest Ref Rng & Units 07/06/2023    8:32 AM 06/08/2023    8:05 AM 05/12/2023    8:17 AM  CMP  Glucose 70 - 99 mg/dL 51  86  096   BUN 8 - 23 mg/dL 17  14  26    Creatinine 0.61 - 1.24 mg/dL 0.45  4.09  8.11   Sodium 135 - 145 mmol/L 141  138  138   Potassium 3.5 - 5.1 mmol/L 4.0  4.0  4.5   Chloride 98 - 111 mmol/L 105  103  105   CO2 22 - 32 mmol/L 26  26  25    Calcium 8.9 - 10.3 mg/dL 8.9  8.7  9.4   Total Protein 6.5 - 8.1 g/dL 6.7  6.5  6.7   Total Bilirubin 0.0 - 1.2 mg/dL 1.5  1.2  1.1   Alkaline Phos 38 - 126 U/L 84  75  79   AST 15 - 41 U/L 16  13  19    ALT 0 - 44 U/L 5  5  9     . RADIOGRAPHIC STUDIES: I have personally reviewed the radiological images as listed and agreed with the  findings in the report. No results found.   ASSESSMENT & PLAN:   68 year old man with:      1.  CLL /SLL diagnosed in 2012 after presenting with stage IIIA, leukocytosis and lymphadenopathy.  He is currently on Calquence  with continued response in his white cell count continues to drop appropriately. His hemoglobin and platelet count has also nearly normalized at this time.   2.  Lymphadenopathy: Related to his lymphoproliferative disorder with cervical adenopathy.  This has nearly resolved at this time.   3.  Tumor lysis syndrome: He is at low risk at this time despite his bulky disease and will continue to monitor his electrolytes  for the time being.  Continues to be on allopurinol  for this.  4. Acute left sided epidydimo-orchitis -- on antibiotics -- improving. Mx by urology PLAN:  -Discussed lab results on 07/06/23 in detail with patient. CBC showed WBC of 4.8K, hemoglobin of 8.5, and platelets of 100K. -low hgb from hematuria -WBCs normal -treatment normalized his previously elevated WBC.  -platelets are not emergently low or concerning -his cervical lymph nodes appear to be smaller based on physical examination -patient noted to have some small lymph nodes in the groin -there is still some hardness over the epididymis -educated patient that an enlarged prostate sometimes cause hematuria -discussed that there is a need for urologist clearance wrt acute left epidydymo-orchitis to continue treatment -discussed priority to ensure that his infection resolves prior to continuing CLL treatment -discussed that since his CLL is relatively stable with regards to WBCs, our current priority is to ensure his epididymitis and orchitis clear completely and his urologisit has a chance to completely evaluate hematuria.  -will hold treatment today to allow his infection to clear and ensure there is no other reason for blood in the urine -since patient has not started them yet, we will hold any new  medications at this time, including allopurinol  and acyclovir  -continue to follow with urology for evaluation of hematuria -discussed that if he still has blood in the urine after treating infection, there may be a role for further evaluation by urology. -patient shall let us  know once he is cleared by urologist  -will hopefully plan to restart gazyva  in 1 month if he is cleared by urology -avoid any OTC NSAIDS due to bleeding concerns  -answered all of patient's questions in detail  FOLLOW-UP: Gazyva  dose held toda Plz schedule next dose of Gazyva , labs and MD visit in 4 weeks  The total time spent in the appointment was 32 minutes* .  All of the patient's questions were answered with apparent satisfaction. The patient knows to call the clinic with any problems, questions or concerns.   Jacquelyn Matt MD MS AAHIVMS [facility] Hematology/Oncology Physician [employer]  .*Total Encounter Time as defined by the Centers for Medicare and Medicaid Services includes, in addition to the face-to-face time of a patient visit (documented in the note above) non-face-to-face time: obtaining and reviewing outside history, ordering and reviewing medications, tests or procedures, care coordination (communications with other health care professionals or caregivers) and documentation in the medical record.    I,Mitra Faeizi,acting as a Neurosurgeon for Jacquelyn Matt, MD.,have documented all relevant documentation on the behalf of Jacquelyn Matt, MD,as directed by  Jacquelyn Matt, MD while in the presence of Jacquelyn Matt, MD.  .I have reviewed the above documentation for accuracy and completeness, and I agree with the above. .Andrian Urbach Kishore Benicia Bergevin MD

## 2023-07-11 ENCOUNTER — Encounter: Payer: Self-pay | Admitting: Hematology

## 2023-07-12 ENCOUNTER — Encounter: Payer: Self-pay | Admitting: Medical

## 2023-08-03 MED FILL — Dexamethasone Sodium Phosphate Inj 100 MG/10ML: INTRAMUSCULAR | Qty: 2 | Status: AC

## 2023-08-03 NOTE — Progress Notes (Incomplete)
 HEMATOLOGY/ONCOLOGY CLINIC NOTE  Date of Service: 08/04/2023  Patient Care Team: [name], MD as PCP - General (Family Medicine) [name], MD (Inactive)  CHIEF COMPLAINTS/PURPOSE OF CONSULTATION:  Evaluation and management of CLL  DIAGNOSIS: 68 year old man with CLL diagnosed in 2012 after presenting with lymphocytosis and adenopathy and stage IIIA disease.   Prior therapy:    Bendamustine  and rituximab  started in July 2017 and received total of 2 cycles. Therapy discontinued in August 2017 based on patient's wishes after achieving partial response.   He developed a relapse disease in April 2019. Ibrutinib  420 mg daily started in April 2019 and discontinued in September 2023 after poor adherence.   CURRENT THERAPY: Calquence  100 mg twice daily started in September 2023.  HISTORY OF PRESENTING ILLNESS:  AVEN CHRISTEN is a wonderful 68 y.o. male who has been a previous patient of Dr. Amadeo. He is here for evaluation and management of CLL.   Patient was last seen by Dr. Amadeo on 02/18/2022 and was doing well overall with no new medical concerns.  Today, he reports that he has been on and off different treatment since 2017, but has been on Calquence  since September, 2023. He does admit that he does forget to take this medication a couple times a week. He reports that his previous treatment of Ibrutinib  did cause him diarrhea.  He did receive two previous injection in his buttocks, but is unsure it is was steroids or antibiotics. Patient is UTD with influenza, RSV, and COVID-19 vaccinations. He denies any abdominal pain, but does note some discomfort along the spleen. He denies any bleeding issues, chest pain, diarrhea, resolved lumps/bumps in neck, infection issues, fevers, or chills.  He does note some SOB related to allergies and chest/sinus congestion. His symptoms are improving. He also reports that his neck edema had improved. He does complain of  sleep deprivation/poor sleep habits and notes some stress. He does typically sleep during the day.  He does take vitamin D and C supplements regularly. On average, he does drink 1 glass of wine a day and denies any major medication changes.  INTERVAL HISTORY:  GREGORI ABRIL is a wonderful 68 y.o. male here for continued evaluation and management of CLL. Patient is scheduled for cycle 5 day 1 of his Gazyva  treatment.  Patient was last seen by me on 07/06/2023 and complained of hematuria, dark-colored urine, mild testicular pain from infection of the left testicle and left epididymis, and noted improved testicular swelling.   -Discussed lab results on 08/04/23 in detail with patient. CBC showed WBC of ***K, hemoglobin of ***, and platelets of ***K. -   Levofloxacin for for his testicular pain.   He is not taking any Finasteride to shrink the prostate.   [facility] at [facility].   MEDICAL HISTORY:  Past Medical History:  Diagnosis Date   Allergy    Arthritis    CLL (chronic lymphocytic leukemia) (HCC)    stage III: asymptomatic; on observation   DIABETES MELLITUS, TYPE I, UNCONTROLLED 12/30/2006   insulin    Diabetic retinopathy    ERECTILE DYSFUNCTION, ORGANIC 12/30/2006   Hx of adenomatous polyp of colon 05/24/2014   HYPERLIPIDEMIA 12/30/2006   HYPERTENSION 12/30/2006   LOW BACK PAIN, ACUTE 12/21/2006    SURGICAL HISTORY: Past Surgical History:  Procedure Laterality Date   COLONOSCOPY     great right toe- Fused the I P joint  SOCIAL HISTORY: Social History   Socioeconomic History   Marital status: Married    Spouse name: Not on file   Number of children: Not on file   Years of education: Not on file   Highest education level: Not on file  Occupational History   Not on file  Tobacco Use   Smoking status: Never   Smokeless tobacco: Never  Vaping Use   Vaping status: Never Used  Substance and  Sexual Activity   Alcohol use: Yes    Alcohol/week: 4.0 standard drinks of alcohol    Types: 4 Glasses of wine per week   Drug use: No   Sexual activity: Not on file    Comment: regular exercise - yes  Other Topics Concern   Not on file  Social History Narrative   Not on file   Social Drivers of Health   Financial Resource Strain: Not on file  Food Insecurity: Not on file  Transportation Needs: Not on file  Physical Activity: Not on file  Stress: Not on file  Social Connections: Unknown (06/12/2021)   [facility]   Social Network    Social Network: Not on file  Intimate Partner Violence: Unknown (05/16/2021)   Received from [facility]   HITS    Physically Hurt: Not on file    Insult or Talk Down To: Not on file    Threaten Physical Harm: Not on file    Scream or Curse: Not on file    FAMILY HISTORY: Family History  Problem Relation Age of Onset   Alcohol abuse Other    Colon cancer Brother 41       Died 2013-11-05   Diabetes Mellitus II Mother    Esophageal cancer Neg Hx    Rectal cancer Neg Hx    Stomach cancer Neg Hx     ALLERGIES:  has no known allergies.  MEDICATIONS:  Current Outpatient Medications  Medication Sig Dispense Refill   acyclovir  (ZOVIRAX ) 400 MG tablet Take 1 tablet (400 mg total) by mouth daily. 30 tablet 5   allopurinol  (ZYLOPRIM ) 300 MG tablet Take 0.5 tablets (150 mg total) by mouth daily. 30 tablet 0   Blood Glucose Monitoring Suppl (ONE TOUCH ULTRA 2) W/DEVICE KIT See admin instructions.  0   Cholecalciferol 50 MCG (2000 UT) TABS TAKE ONE TABLET BY MOUTH DAILY FOR LOW VITAMIN D.     doxepin  (SINEQUAN ) 25 MG capsule Take 1 capsule by mouth at bedtime.     empagliflozin (JARDIANCE) 25 MG TABS tablet Take 0.5 tablets by mouth every morning. Only if blood sugars get too high     finasteride (PROPECIA) 1 MG tablet Take 1 tablet by mouth daily.     finasteride (PROSCAR) 5 MG tablet Take 0.5 tablets by mouth daily.      Fluocin-Hydroquinone-Tretinoin (TRI-LUMA ) 0.01-4-0.05 % CREA Apply 1 application. topically at bedtime. (Patient not taking: Reported on 04/20/2023) 30 g 2   Insulin  Glargine (LANTUS  SOLOSTAR) 100 UNIT/ML Solostar Pen INJECT 20 UNITS SUBCUTANEOUSLY AT BEDTIME 3 pen 11   INSULIN  GLARGINE, 1 UNIT DIAL, [place] INJECT 20 UNITS SUBCUTANEOUSLY DAILY (Patient not taking: Reported on 04/20/2023)     insulin  glargine-yfgn (SEMGLEE ) 100 UNIT/ML Pen INJECT 22 UNITS SUBCUTANEOUSLY AT BEDTIME (USE WITHIN 28 DAYS AFTER OPENING PEN) (CONVERTED FROM LANTUS )     lisinopril  (PRINIVIL ,ZESTRIL ) 40 MG tablet Take 1 tablet (40 mg total) by mouth daily. 90 tablet 4   meloxicam  (MOBIC ) 7.5 MG tablet TAKE 1 TABLET (7.5  MG TOTAL) BY MOUTH DAILY. (Patient taking differently: TAKE 1 TABLET (7.5 MG TOTAL) BY MOUTH DAILY. As needed) 30 tablet 1   ondansetron  (ZOFRAN ) 8 MG tablet Take 1 tablet (8 mg total) by mouth every 8 (eight) hours as needed for nausea or vomiting. 30 tablet 1   ONE TOUCH ULTRA TEST test strip TEST GLUCOSE 3 TIMES A DAY 300 each 1   ONETOUCH DELICA LANCETS 33G MISC TEST GLUCOSE 3 TIMES A DAY 300 each 1   prochlorperazine  (COMPAZINE ) 10 MG tablet Take 1 tablet (10 mg total) by mouth every 6 (six) hours as needed for nausea or vomiting. 30 tablet 1   sildenafil  (REVATIO ) 20 MG tablet Take 1 to 2 tabs 2 - 3 hours before sex 30 tablet 11   simvastatin  (ZOCOR ) 20 MG tablet TAKE 1 TABLET (20 MG TOTAL) BY MOUTH AT BEDTIME. 100 tablet 4   venetoclax  (VENCLEXTA ) 10 & 50 & 100 MG Starter Pack Take by mouth daily. Take 20 mg for 7 days, then 50 mg daily x 7d, then 100 mg daily x 7d, then 200 mg daily x 7d. Take with food & water. 42 tablet 0   No current facility-administered medications for this visit.    REVIEW OF SYSTEMS:    10 Point review of Systems was done is negative except as noted above.   PHYSICAL EXAMINATION: ECOG PERFORMANCE STATUS: 1 - Symptomatic but completely ambulatory .SABRAThere were no vitals taken for  this visit.   GENERAL:alert, in no acute distress and comfortable SKIN: no acute rashes, no significant lesions EYES: conjunctiva are pink and non-injected, sclera anicteric OROPHARYNX: MMM, no exudates, no oropharyngeal erythema or ulceration NECK: supple, no JVD LYMPH:  no palpable lymphadenopathy in the cervical, axillary or inguinal regions LUNGS: clear to auscultation b/l with normal respiratory effort HEART: regular rate & rhythm ABDOMEN:  normoactive bowel sounds , non tender, not distended. Extremity: no pedal edema PSYCH: alert & oriented x 3 with fluent speech NEURO: no focal motor/sensory deficits   LABORATORY DATA:  I have reviewed the data as listed .    Latest Ref Rng & Units 07/06/2023    8:32 AM 06/08/2023    8:05 AM 05/12/2023    8:17 AM  CBC  WBC 4.0 - 10.5 K/uL 4.8  4.0  3.2   Hemoglobin 13.0 - 17.0 g/dL 8.5  8.9  89.2   Hematocrit 39.0 - 52.0 % 26.2  26.4  32.1   Platelets 150 - 400 K/uL 100  117  106    .CMP     Component Value Date/Time   NA 141 07/06/2023 0832   NA 142 01/01/2017 0809   K 4.0 07/06/2023 0832   K 4.4 01/01/2017 0809   CL 105 07/06/2023 0832   CL 102 04/25/2012 1003   CO2 26 07/06/2023 0832   CO2 26 01/01/2017 0809   GLUCOSE 51 (L) 07/06/2023 0832   GLUCOSE 89 01/01/2017 0809   GLUCOSE 170 (H) 04/25/2012 1003   BUN 17 07/06/2023 0832   BUN 15.7 01/01/2017 0809   CREATININE 0.98 07/06/2023 0832   CREATININE 0.9 01/01/2017 0809   CALCIUM 8.9 07/06/2023 0832   CALCIUM 9.1 01/01/2017 0809   PROT 6.7 07/06/2023 0832   PROT 7.0 01/01/2017 0809   ALBUMIN 4.2 07/06/2023 0832   ALBUMIN 4.2 01/01/2017 0809   AST 16 07/06/2023 0832   AST 22 01/01/2017 0809   ALT 5 07/06/2023 0832   ALT 19 01/01/2017 0809   ALKPHOS 84 07/06/2023  9167   ALKPHOS 68 01/01/2017 0809   BILITOT 1.5 (H) 07/06/2023 0832   BILITOT 0.75 01/01/2017 0809   GFRNONAA >60 07/06/2023 0832   GFRAA >60 07/25/2019 0932   .    Latest Ref Rng & Units 07/06/2023     8:32 AM 06/08/2023    8:05 AM 05/12/2023    8:17 AM  CMP  Glucose 70 - 99 mg/dL 51  86  864   BUN 8 - 23 mg/dL 17  14  26    Creatinine 0.61 - 1.24 mg/dL 9.01  8.97  8.70   Sodium 135 - 145 mmol/L 141  138  138   Potassium 3.5 - 5.1 mmol/L 4.0  4.0  4.5   Chloride 98 - 111 mmol/L 105  103  105   CO2 22 - 32 mmol/L 26  26  25    Calcium 8.9 - 10.3 mg/dL 8.9  8.7  9.4   Total Protein 6.5 - 8.1 g/dL 6.7  6.5  6.7   Total Bilirubin 0.0 - 1.2 mg/dL 1.5  1.2  1.1   Alkaline Phos 38 - 126 U/L 84  75  79   AST 15 - 41 U/L 16  13  19    ALT 0 - 44 U/L 5  5  9     . RADIOGRAPHIC STUDIES: I have personally reviewed the radiological images as listed and agreed with the findings in the report. No results found.   ASSESSMENT & PLAN:   68 year old man with:      1.  CLL /SLL diagnosed in 2012 after presenting with stage IIIA, leukocytosis and lymphadenopathy.  He is currently on Calquence  with continued response in his white cell count continues to drop appropriately. His hemoglobin and platelet count has also nearly normalized at this time.   2.  Lymphadenopathy: Related to his lymphoproliferative disorder with cervical adenopathy.  This has nearly resolved at this time.   3.  Tumor lysis syndrome: He is at low risk at this time despite his bulky disease and will continue to monitor his electrolytes for the time being.  Continues to be on allopurinol  for this.  4. Acute left sided epidydimo-orchitis -- on antibiotics -- improving. Mx by urology  PLAN:  -Discussed lab results on 07/06/23 in detail with patient. CBC showed WBC of 4.8K, hemoglobin of 8.5, and platelets of 100K. -low hgb from hematuria -WBCs normal -treatment normalized his previously elevated WBC.  -platelets are not emergently low or concerning -his cervical lymph nodes appear to be smaller based on physical examination -patient noted to have some small lymph nodes in the groin -there is still some hardness over the  epididymis -educated patient that an enlarged prostate sometimes cause hematuria -discussed that there is a need for urologist clearance wrt acute left epidydymo-orchitis to continue treatment -discussed priority to ensure that his infection resolves prior to continuing CLL treatment -discussed that since his CLL is relatively stable with regards to WBCs, our current priority is to ensure his epididymitis and orchitis clear completely and his urologisit has a chance to completely evaluate hematuria.  -will hold treatment today to allow his infection to clear and ensure there is no other reason for blood in the urine -since patient has not started them yet, we will hold any new medications at this time, including allopurinol  and acyclovir  -continue to follow with urology for evaluation of hematuria -discussed that if he still has blood in the urine after treating infection, there may be a role for  further evaluation by urology. -patient shall let us  know once he is cleared by urologist  -will hopefully plan to restart gazyva  in 1 month if he is cleared by urology -avoid any OTC NSAIDS due to bleeding concerns  -answered all of patient's questions in detail  FOLLOW-UP: ***  The total time spent in the appointment was *** minutes* .  All of the patient's questions were answered with apparent satisfaction. The patient knows to call the clinic with any problems, questions or concerns.   Emaline Saran MD MS AAHIVMS [facility] Hematology/Oncology Physician [facility]  .*Total Encounter Time as defined by the Centers for Medicare and Medicaid Services includes, in addition to the face-to-face time of a patient visit (documented in the note above) non-face-to-face time: obtaining and reviewing outside history, ordering and reviewing medications, tests or procedures, care coordination (communications with other health care professionals or caregivers) and documentation in the medical  record.    I,Mitra Faeizi,acting as a Neurosurgeon for Emaline Saran, MD.,have documented all relevant documentation on the behalf of Emaline Saran, MD,as directed by  Emaline Saran, MD while in the presence of Emaline Saran, MD.  ***

## 2023-08-04 ENCOUNTER — Ambulatory Visit

## 2023-08-04 ENCOUNTER — Inpatient Hospital Stay: Attending: Hematology

## 2023-08-04 ENCOUNTER — Inpatient Hospital Stay: Admitting: Hematology

## 2023-08-06 ENCOUNTER — Emergency Department (HOSPITAL_COMMUNITY)

## 2023-08-06 ENCOUNTER — Inpatient Hospital Stay (HOSPITAL_COMMUNITY)
Admission: EM | Admit: 2023-08-06 | Discharge: 2023-08-18 | DRG: 824 | Disposition: A | Discharge status: Home Health | Attending: Family Medicine | Admitting: Family Medicine

## 2023-08-06 ENCOUNTER — Other Ambulatory Visit: Payer: Self-pay

## 2023-08-06 ENCOUNTER — Encounter (HOSPITAL_COMMUNITY): Payer: Self-pay | Admitting: Emergency Medicine

## 2023-08-06 DIAGNOSIS — I10 Essential (primary) hypertension: Secondary | ICD-10-CM | POA: Diagnosis present

## 2023-08-06 DIAGNOSIS — E119 Type 2 diabetes mellitus without complications: Secondary | ICD-10-CM | POA: Diagnosis not present

## 2023-08-06 DIAGNOSIS — Z794 Long term (current) use of insulin: Secondary | ICD-10-CM

## 2023-08-06 DIAGNOSIS — E785 Hyperlipidemia, unspecified: Secondary | ICD-10-CM | POA: Diagnosis present

## 2023-08-06 DIAGNOSIS — Z79899 Other long term (current) drug therapy: Secondary | ICD-10-CM

## 2023-08-06 DIAGNOSIS — R2689 Other abnormalities of gait and mobility: Secondary | ICD-10-CM | POA: Diagnosis present

## 2023-08-06 DIAGNOSIS — Z5971 Insufficient health insurance coverage: Secondary | ICD-10-CM

## 2023-08-06 DIAGNOSIS — C9110 Chronic lymphocytic leukemia of B-cell type not having achieved remission: Principal | ICD-10-CM | POA: Diagnosis present

## 2023-08-06 DIAGNOSIS — C8590 Non-Hodgkin lymphoma, unspecified, unspecified site: Secondary | ICD-10-CM | POA: Diagnosis not present

## 2023-08-06 DIAGNOSIS — E871 Hypo-osmolality and hyponatremia: Secondary | ICD-10-CM | POA: Diagnosis present

## 2023-08-06 DIAGNOSIS — R627 Adult failure to thrive: Secondary | ICD-10-CM | POA: Diagnosis present

## 2023-08-06 DIAGNOSIS — E1165 Type 2 diabetes mellitus with hyperglycemia: Secondary | ICD-10-CM | POA: Diagnosis present

## 2023-08-06 DIAGNOSIS — Z604 Social exclusion and rejection: Secondary | ICD-10-CM | POA: Diagnosis present

## 2023-08-06 DIAGNOSIS — Z7984 Long term (current) use of oral hypoglycemic drugs: Secondary | ICD-10-CM | POA: Diagnosis not present

## 2023-08-06 DIAGNOSIS — Z800 Family history of malignant neoplasm of digestive organs: Secondary | ICD-10-CM | POA: Diagnosis not present

## 2023-08-06 DIAGNOSIS — Z87440 Personal history of urinary (tract) infections: Secondary | ICD-10-CM

## 2023-08-06 DIAGNOSIS — M545 Low back pain, unspecified: Secondary | ICD-10-CM | POA: Diagnosis present

## 2023-08-06 DIAGNOSIS — K5900 Constipation, unspecified: Secondary | ICD-10-CM | POA: Diagnosis not present

## 2023-08-06 DIAGNOSIS — D630 Anemia in neoplastic disease: Secondary | ICD-10-CM | POA: Diagnosis present

## 2023-08-06 DIAGNOSIS — Z833 Family history of diabetes mellitus: Secondary | ICD-10-CM | POA: Diagnosis not present

## 2023-08-06 DIAGNOSIS — Z860101 Personal history of adenomatous and serrated colon polyps: Secondary | ICD-10-CM | POA: Diagnosis not present

## 2023-08-06 DIAGNOSIS — N179 Acute kidney failure, unspecified: Principal | ICD-10-CM | POA: Diagnosis present

## 2023-08-06 DIAGNOSIS — D61818 Other pancytopenia: Secondary | ICD-10-CM | POA: Diagnosis present

## 2023-08-06 DIAGNOSIS — T380X5A Adverse effect of glucocorticoids and synthetic analogues, initial encounter: Secondary | ICD-10-CM | POA: Diagnosis present

## 2023-08-06 DIAGNOSIS — Z0189 Encounter for other specified special examinations: Secondary | ICD-10-CM | POA: Diagnosis not present

## 2023-08-06 DIAGNOSIS — E860 Dehydration: Secondary | ICD-10-CM | POA: Diagnosis present

## 2023-08-06 LAB — CBC
HCT: 28.6 % — ABNORMAL LOW (ref 39.0–52.0)
Hemoglobin: 9 g/dL — ABNORMAL LOW (ref 13.0–17.0)
MCH: 31.6 pg (ref 26.0–34.0)
MCHC: 31.5 g/dL (ref 30.0–36.0)
MCV: 100.4 fL — ABNORMAL HIGH (ref 80.0–100.0)
Platelets: 38 10*3/uL — ABNORMAL LOW (ref 150–400)
RBC: 2.85 MIL/uL — ABNORMAL LOW (ref 4.22–5.81)
RDW: 17.1 % — ABNORMAL HIGH (ref 11.5–15.5)
WBC: 2.9 10*3/uL — ABNORMAL LOW (ref 4.0–10.5)
nRBC: 1 % — ABNORMAL HIGH (ref 0.0–0.2)

## 2023-08-06 LAB — COMPREHENSIVE METABOLIC PANEL WITH GFR
ALT: 12 U/L (ref 0–44)
AST: 25 U/L (ref 15–41)
Albumin: 4.2 g/dL (ref 3.5–5.0)
Alkaline Phosphatase: 73 U/L (ref 38–126)
Anion gap: 11 (ref 5–15)
BUN: 45 mg/dL — ABNORMAL HIGH (ref 8–23)
CO2: 22 mmol/L (ref 22–32)
Calcium: 9.6 mg/dL (ref 8.9–10.3)
Chloride: 101 mmol/L (ref 98–111)
Creatinine, Ser: 2.56 mg/dL — ABNORMAL HIGH (ref 0.61–1.24)
GFR, Estimated: 27 mL/min — ABNORMAL LOW (ref >60)
Glucose, Bld: 111 mg/dL — ABNORMAL HIGH (ref 70–99)
Potassium: 4.4 mmol/L (ref 3.5–5.1)
Sodium: 134 mmol/L — ABNORMAL LOW (ref 135–145)
Total Bilirubin: 1.1 mg/dL (ref 0.0–1.2)
Total Protein: 6.9 g/dL (ref 6.5–8.1)

## 2023-08-06 LAB — URINALYSIS, ROUTINE W REFLEX MICROSCOPIC
Bilirubin Urine: NEGATIVE
Glucose, UA: NEGATIVE mg/dL
Hgb urine dipstick: NEGATIVE
Ketones, ur: NEGATIVE mg/dL
Leukocytes,Ua: NEGATIVE
Nitrite: NEGATIVE
Protein, ur: 30 mg/dL — AB
Specific Gravity, Urine: 1.015 (ref 1.005–1.030)
pH: 5 (ref 5.0–8.0)

## 2023-08-06 LAB — CBG MONITORING, ED
Glucose-Capillary: 122 mg/dL — ABNORMAL HIGH (ref 70–99)
Glucose-Capillary: 110 mg/dL — ABNORMAL HIGH (ref 70–99)

## 2023-08-06 MED ORDER — ACYCLOVIR 400 MG PO TABS
400.0000 mg | ORAL_TABLET | Freq: Every day | ORAL | Status: DC
  Administered 2023-08-06 – 2023-08-18 (×13): 400 mg via ORAL
  Filled 2023-08-06 (×13): qty 1

## 2023-08-06 MED ORDER — ALLOPURINOL 100 MG PO TABS
150.0000 mg | ORAL_TABLET | Freq: Every day | ORAL | Status: DC
  Administered 2023-08-07 – 2023-08-18 (×12): 150 mg via ORAL
  Filled 2023-08-06 (×12): qty 2

## 2023-08-06 MED ORDER — GABAPENTIN 100 MG PO CAPS
100.0000 mg | ORAL_CAPSULE | Freq: Every day | ORAL | Status: DC
  Administered 2023-08-06 – 2023-08-17 (×12): 100 mg via ORAL
  Filled 2023-08-06 (×12): qty 1

## 2023-08-06 MED ORDER — SIMVASTATIN 20 MG PO TABS
20.0000 mg | ORAL_TABLET | Freq: Every day | ORAL | Status: DC
  Administered 2023-08-06 – 2023-08-17 (×12): 20 mg via ORAL
  Filled 2023-08-06 (×12): qty 1

## 2023-08-06 MED ORDER — SODIUM CHLORIDE 0.9 % IV BOLUS
1000.0000 mL | Freq: Once | INTRAVENOUS | Status: AC
  Administered 2023-08-06: 1000 mL via INTRAVENOUS

## 2023-08-06 MED ORDER — FINASTERIDE 1 MG PO TABS: 1.0000 mg | ORAL_TABLET | Freq: Every day | ORAL | Status: DC

## 2023-08-06 MED ORDER — LIDOCAINE 5 % EX PTCH
1.0000 | MEDICATED_PATCH | CUTANEOUS | Status: DC
  Administered 2023-08-06 – 2023-08-17 (×10): 1 via TRANSDERMAL
  Filled 2023-08-06 (×10): qty 1

## 2023-08-06 MED ORDER — MORPHINE SULFATE (PF) 4 MG/ML IV SOLN
4.0000 mg | Freq: Once | INTRAVENOUS | Status: AC
  Administered 2023-08-06: 4 mg via INTRAVENOUS
  Filled 2023-08-06: qty 1

## 2023-08-06 MED ORDER — ONDANSETRON HCL 4 MG/2ML IJ SOLN
4.0000 mg | Freq: Once | INTRAMUSCULAR | Status: AC
  Administered 2023-08-06: 4 mg via INTRAVENOUS
  Filled 2023-08-06: qty 2

## 2023-08-06 MED ORDER — INSULIN GLARGINE-YFGN 100 UNIT/ML ~~LOC~~ SOLN
8.0000 [IU] | Freq: Every day | SUBCUTANEOUS | Status: DC
  Administered 2023-08-07 – 2023-08-12 (×6): 8 [IU] via SUBCUTANEOUS
  Filled 2023-08-06 (×7): qty 0.08

## 2023-08-06 MED ORDER — SODIUM CHLORIDE 0.9 % IV SOLN: INTRAVENOUS | Status: AC

## 2023-08-06 MED ORDER — MORPHINE SULFATE (PF) 2 MG/ML IV SOLN: 2.0000 mg | INTRAVENOUS | Status: DC | PRN

## 2023-08-06 MED ORDER — INSULIN ASPART 100 UNIT/ML IJ SOLN
0.0000 [IU] | Freq: Three times a day (TID) | INTRAMUSCULAR | Status: DC
  Administered 2023-08-07 (×2): 1 [IU] via SUBCUTANEOUS
  Administered 2023-08-08: 3 [IU] via SUBCUTANEOUS
  Administered 2023-08-08: 5 [IU] via SUBCUTANEOUS
  Administered 2023-08-09: 2 [IU] via SUBCUTANEOUS
  Administered 2023-08-09: 1 [IU] via SUBCUTANEOUS
  Administered 2023-08-10 – 2023-08-11 (×4): 2 [IU] via SUBCUTANEOUS
  Administered 2023-08-11: 1 [IU] via SUBCUTANEOUS
  Administered 2023-08-11: 3 [IU] via SUBCUTANEOUS
  Administered 2023-08-12: 7 [IU] via SUBCUTANEOUS
  Administered 2023-08-12: 9 [IU] via SUBCUTANEOUS
  Administered 2023-08-12 – 2023-08-13 (×2): 7 [IU] via SUBCUTANEOUS
  Administered 2023-08-13: 5 [IU] via SUBCUTANEOUS
  Administered 2023-08-13: 3 [IU] via SUBCUTANEOUS
  Administered 2023-08-14 (×2): 5 [IU] via SUBCUTANEOUS
  Administered 2023-08-14: 2 [IU] via SUBCUTANEOUS
  Administered 2023-08-15 (×2): 7 [IU] via SUBCUTANEOUS
  Administered 2023-08-15: 2 [IU] via SUBCUTANEOUS
  Administered 2023-08-16: 1 [IU] via SUBCUTANEOUS
  Administered 2023-08-16 – 2023-08-17 (×2): 7 [IU] via SUBCUTANEOUS
  Administered 2023-08-18: 1 [IU] via SUBCUTANEOUS
  Filled 2023-08-06: qty 0.09

## 2023-08-06 NOTE — ED Provider Notes (Signed)
 Emergency Department Provider Note   I have reviewed the triage vital signs and the nursing notes.   HISTORY  Chief Complaint Back Pain and Weakness   HPI Tyler Scott is a 68 y.o. male with history of diabetes, CLL, hypertension, hyperlipidemia presents to the emergency department with lower back pain.  Pain radiates between both legs into the hips.  He denies any numbness.  He has a constant throbbing pain in his lower back which is worse with movement.  He feels some subjective bilateral weakness which makes it almost impossible for him to walk.  No known injuries or falls.  He does continue treatment for his CLL.  He also describes poor appetite with poor fluid intake. No vomiting or diarrhea.    Past Medical History:  Diagnosis Date   Allergy    Arthritis    CLL (chronic lymphocytic leukemia) (HCC)    stage III: asymptomatic; on observation   DIABETES MELLITUS, TYPE I, UNCONTROLLED 12/30/2006   insulin    Diabetic retinopathy    ERECTILE DYSFUNCTION, ORGANIC 12/30/2006   Hx of adenomatous polyp of colon 05/24/2014   HYPERLIPIDEMIA 12/30/2006   HYPERTENSION 12/30/2006   LOW BACK PAIN, ACUTE 12/21/2006    Review of Systems  Constitutional: No fever/chills Cardiovascular: Denies chest pain. Respiratory: Denies shortness of breath. Gastrointestinal: No abdominal pain.  No nausea, no vomiting.  Musculoskeletal: Positive for back pain. Skin: Negative for rash. Neurological: Negative for headaches, focal weakness or numbness.  ____________________________________________   PHYSICAL EXAM:  VITAL SIGNS: ED Triage Vitals  Encounter Vitals Group     BP 08/06/23 1506 (!) 94/50     Pulse Rate 08/06/23 1506 86     Resp 08/06/23 1506 18     Temp 08/06/23 1506 (!) 97.5 F (36.4 C)     Temp Source 08/06/23 1506 Oral     SpO2 08/06/23 1506 100 %     Weight 08/06/23 1506 145 lb (65.8 kg)     Height 08/06/23 1506 5' 8 (1.727 m)   Constitutional: Alert and  oriented. Well appearing and in no acute distress. Eyes: Conjunctivae are normal.  Head: Atraumatic. Nose: No congestion/rhinnorhea. Mouth/Throat: Mucous membranes are moist.   Neck: No stridor.   Cardiovascular: Normal rate, regular rhythm. Good peripheral circulation. Grossly normal heart sounds.   Respiratory: Normal respiratory effort.  No retractions. Lungs CTAB. Gastrointestinal: Soft and nontender. No distention.  Musculoskeletal: No lower extremity tenderness nor edema. No gross deformities of extremities.  Neurologic:  Normal speech and language. No gross focal neurologic deficits are appreciated. Normal sensation in the bilateral LEs. 4/5 strength in the bilateral LEs 2/2 pain.  Skin:  Skin is warm, dry and intact. No rash noted.  ____________________________________________   LABS (all labs ordered are listed, but only abnormal results are displayed)  Labs Reviewed  COMPREHENSIVE METABOLIC PANEL WITH GFR - Abnormal; Notable for the following components:      Result Value   Sodium 134 (*)    Glucose, Bld 111 (*)    BUN 45 (*)    Creatinine, Ser 2.56 (*)    GFR, Estimated 27 (*)    All other components within normal limits  CBC - Abnormal; Notable for the following components:   WBC 2.9 (*)    RBC 2.85 (*)    Hemoglobin 9.0 (*)    HCT 28.6 (*)    MCV 100.4 (*)    RDW 17.1 (*)    Platelets 38 (*)    nRBC 1.0 (*)  All other components within normal limits  URINALYSIS, ROUTINE W REFLEX MICROSCOPIC - Abnormal; Notable for the following components:   APPearance HAZY (*)    Protein, ur 30 (*)    Bacteria, UA RARE (*)    All other components within normal limits  CBG MONITORING, ED - Abnormal; Notable for the following components:   Glucose-Capillary 122 (*)    All other components within normal limits   ____________________________________________  EKG   EKG Interpretation Date/Time:  Friday August 06 2023 15:25:49 EDT Ventricular Rate:  87 PR  Interval:  157 QRS Duration:  91 QT Interval:  371 QTC Calculation: 447 R Axis:   8  Text Interpretation: Sinus rhythm Baseline wander in lead(s) V2 V3 Confirmed by Darra Chew [phone]) on 08/06/2023 9:25:43 PM        ____________________________________________  RADIOLOGY  MR LUMBAR SPINE WO CONTRAST Result Date: 08/06/2023 CLINICAL DATA:  Myelopathy, acute, lumbar spine. EXAM: MRI LUMBAR SPINE WITHOUT CONTRAST TECHNIQUE: Multiplanar, multisequence MR imaging of the lumbar spine was performed. No intravenous contrast was administered. COMPARISON:  None Available. FINDINGS: Segmentation:  Standard. Alignment:  No substantial sagittal subluxation. Vertebrae: Diffusely low T1 signal throughout the bone marrow with heterogeneous signal in the sacral vertebral bodies. No evidence of acute fracture or discitis/osteomyelitis. Conus medullaris and cauda equina: Conus extends to the L1-L2 level. Conus and cauda equina appear normal. Paraspinal and other soft tissues: There appears to be intermediate extraosseous signal surrounding the vertebral bodies at multiple levels (for example see series 9, image 28 and series 9, image 20), possibly extraosseous extension of tumor but not well assessed without contrast. Please see same day CT of the abdomen/pelvis for description of retroperitoneal nodal mass and other intra-abdominal intrapelvic findings. Disc levels: T12-L1: No significant disc protrusion, foraminal stenosis, or canal stenosis. L1-L2: No significant disc protrusion, foraminal stenosis, or canal stenosis. L2-L3: No significant disc protrusion, foraminal stenosis, or canal stenosis. L3-L4: No significant disc protrusion, foraminal stenosis, or canal stenosis. L4-L5: Mild disc bulge and endplate spurring without significant stenosis. L5-S1: Question subtle intermediate signal along the posterior aspect of the L5 vertebral body which partially effaces ventral CSF. Mild disc bulging at the disc level  without significant canal or foraminal stenosis. Sacral canal: Question mild intermediate signal in the ventral canal without compressive stenosis. IMPRESSION: 1. Diffusely abnormal marrow signal, likely related to the patient's underlying lymphoma. 2. Masslike extraosseous paraspinal signal abnormality at multiple levels, and L4 and subtle, equivacal signal in the ventral canal in the lower lumbar canal and sacral canal could potentially represent extraosseous extension of tumor but is incompletely assessed without contrast. If the patient is able, consider postcontrast imaging. 3. No compressive canal or foraminal stenosis. Electronically Signed   By: Gilmore GORMAN Molt M.D.   On: 08/06/2023 19:37   CT ABDOMEN PELVIS WO CONTRAST Result Date: 08/06/2023 CLINICAL DATA:  Chronic lymphocytic leukemia. Acute abdominal pain. * Tracking Code: BO * EXAM: CT ABDOMEN AND PELVIS WITHOUT CONTRAST TECHNIQUE: Multidetector CT imaging of the abdomen and pelvis was performed following the standard protocol without IV contrast. RADIATION DOSE REDUCTION: This exam was performed according to the departmental dose-optimization program which includes automated exposure control, adjustment of the mA and/or kV according to patient size and/or use of iterative reconstruction technique. COMPARISON:  PET-CT 03/05/2023 FINDINGS: Lower chest: Thickened paraspinal tissue in the posterior mediastinum (image 8/2). Findings consistent lymphoma. Hepatobiliary: No focal hepatic lesion. Normal gallbladder. No biliary duct dilatation. Common bile duct is normal. Pancreas: Pancreas is normal.  No ductal dilatation. No pancreatic inflammation. Spleen: Spleen is mildly enlarged at 14. Cm in craniocaudad dimension Adrenals/urinary tract: Adrenal glands and kidneys are normal. The ureters and bladder normal. Stomach/Bowel: Stomach, small bowel, appendix, and cecum are normal. The colon and rectosigmoid colon are normal. Vascular/Lymphatic: Bulky  retroperitoneal lymphadenopathy increased from comparison exam. Retroperitoneal nodal mass at the level of the lower pole LEFT kidney LEFT aorta measures 7.4 cm (image 46/2) compared to 5.1 cm comparison PET-CT scan. The aorta is elevated by 19 mm by retroperitoneal adenopathy compared to 8 mm on prior. Example node ventral to the aorta measuring 33 mm on image 52 compares to 23 mm. Bulky adenopathy extends along the iliac chains. LEFT external iliac lymph node anterior to the operator space measuring 22 mm compared to 13 mm. Minimal inguinal adenopathy. Reproductive: Prostate unremarkable Other: No free fluid. Musculoskeletal: No aggressive osseous lesion. Posterior fusion of the L4 spinous processes IMPRESSION: 1. Progression of lymphoma lymphadenopathy with increase in bulky retroperitoneal nodal mass. 2. Increase in perispinal thickening in the posterior mediastinum. 3. Mild splenomegaly. 4. No bowel obstruction. Electronically Signed   By: Jackquline Boxer M.D.   On: 08/06/2023 18:20    ____________________________________________   PROCEDURES  Procedure(s) performed:   Procedures  None  ____________________________________________   INITIAL IMPRESSION / ASSESSMENT AND PLAN / ED COURSE  Pertinent labs & imaging results that were available during my care of the patient were reviewed by me and considered in my medical decision making (see chart for details).   This patient is Presenting for Evaluation of back pain, which does require a range of treatment options, and is a complaint that involves a high risk of morbidity and mortality.  The Differential Diagnoses includes but is not exclusive to musculoskeletal back pain, renal colic, urinary tract infection, pyelonephritis, intra-abdominal causes of back pain, aortic aneurysm or dissection, cauda equina syndrome, sciatica, lumbar disc disease, thoracic disc disease, etc.   Critical Interventions-    Medications  finasteride (PROPECIA)  tablet 1 mg (has no administration in time range)  0.9 %  sodium chloride  infusion (has no administration in time range)  morphine (PF) 2 MG/ML injection 2 mg (has no administration in time range)  lidocaine (LIDODERM) 5 % 1 patch (has no administration in time range)  gabapentin (NEURONTIN) capsule 100 mg (has no administration in time range)  sodium chloride  0.9 % bolus 1,000 mL (0 mLs Intravenous Stopped 08/06/23 1948)  morphine (PF) 4 MG/ML injection 4 mg (4 mg Intravenous Given 08/06/23 2024)  ondansetron  (ZOFRAN ) injection 4 mg (4 mg Intravenous Given 08/06/23 2023)    Reassessment after intervention: BP improved.    I did obtain Additional Historical Information from wife at bedside.   I decided to review pertinent External Data, and in summary patient with normal renal function 1 month prior. Follows with Dr. Onesimo w/ Oncology.   Clinical Laboratory Tests Ordered, included new AKI with creatinine 2.56. Anemia of 9.0.   Radiologic Tests Ordered, included MRI lumbar spine and CT abdomen/pelvis. I independently interpreted the images and agree with radiology interpretation.   Cardiac Monitor Tracing which shows NSR.    Social Determinants of Health Risk patient is a non-smoker.   Consult complete with NSG, Dr. Lanis. No acute surgical intervention. Would defer to Dr. Onesimo and team regarding CLL treatment plan.   TRH. Plan for admit.   Medical Decision Making: Summary:  The patient presents to the emergency department with back pain radiating to both legs.  May  be sciatica but the bilateral nature of this is concerning along with ongoing cancer history.  His renal function is much worse today compared to 1 month prior, likely secondary to poor p.o. intake.  He cannot receive a contrasted study in this setting.  Plan for CT abdomen pelvis without contrast along with MRI spine.  Reevaluation with update and discussion with patient. Plan for admit for rehydration and pain mgmt.  Discussed results of CT and MRI imaging. He is in agreement with plan.   Patient's presentation is most consistent with acute presentation with potential threat to life or bodily function.   Disposition: admit  ____________________________________________  FINAL CLINICAL IMPRESSION(S) / ED DIAGNOSES  Final diagnoses:  AKI (acute kidney injury) (HCC)  Acute midline low back pain without sciatica    Note:  This document was prepared using Dragon voice recognition software and may include unintentional dictation errors.  Fonda Law, MD, [facility] Emergency Medicine    Jonae Renshaw, Fonda MATSU, MD 08/06/23 2141

## 2023-08-06 NOTE — ED Triage Notes (Signed)
 Pt came in for weakness and back spasms for the last 5 days. Pt stated he's having back, leg, and hip pain as well.   Hx stage 4 chronic lymphoma leukemia

## 2023-08-06 NOTE — ED Notes (Signed)
 .ED TO INPATIENT HANDOFF REPORT  Name/Age/Gender Tyler Scott 68 y.o. male  Code Status    Code Status Orders  (From admission, onward)           Start     Ordered   08/06/23 2136  Full code  Continuous       Question:  By:  Answer:  Consent: discussion documented in EHR   08/06/23 2135           Code Status History     This patient has a current code status but no historical code status.       Home/SNF/Other Home  Chief Complaint AKI (acute kidney injury) (HCC) [N17.9]  Level of Care/Admitting Diagnosis ED Disposition     ED Disposition  Admit   Condition  --   Comment  Hospital Area: [facility] [[id number]]  Level of Care: Med-Surg [16]  May admit patient to [facility] or [facility] if equivalent level of care is available:: No  Covid Evaluation: Asymptomatic - no recent exposure (last 10 days) testing not required  Diagnosis: AKI (acute kidney injury) [facility]) [[id number]]  Admitting Physician: JERRI KEYS [[id number]]  Attending Physician: JERRI KEYS [[id number]]  Certification:: I certify this patient will need inpatient services for at least 2 midnights  Expected Medical Readiness: 08/09/2023          Medical History Past Medical History:  Diagnosis Date   Allergy    Arthritis    CLL (chronic lymphocytic leukemia) (HCC)    stage III: asymptomatic; on observation   DIABETES MELLITUS, TYPE I, UNCONTROLLED 12/30/2006   insulin    Diabetic retinopathy    ERECTILE DYSFUNCTION, ORGANIC 12/30/2006   Hx of adenomatous polyp of colon 05/24/2014   HYPERLIPIDEMIA 12/30/2006   HYPERTENSION 12/30/2006   LOW BACK PAIN, ACUTE 12/21/2006    Allergies No Known Allergies  IV Location/Drains/Wounds Patient Lines/Drains/Airways Status     Active Line/Drains/Airways     Name Placement date Placement time Site Days   Peripheral IV 08/06/23 20 G Anterior;Proximal;Right Forearm 08/06/23  1800  Forearm  less than 1             Labs/Imaging Results for orders placed or performed during the hospital encounter of 08/06/23 (from the past 48 hours)  Comprehensive metabolic panel     Status: Abnormal   Collection Time: 08/06/23  3:13 PM  Result Value Ref Range   Sodium 134 (L) 135 - 145 mmol/L   Potassium 4.4 3.5 - 5.1 mmol/L   Chloride 101 98 - 111 mmol/L   CO2 22 22 - 32 mmol/L   Glucose, Bld 111 (H) 70 - 99 mg/dL    Comment: Glucose reference range applies only to samples taken after fasting for at least 8 hours.   BUN 45 (H) 8 - 23 mg/dL   Creatinine, Ser 7.43 (H) 0.61 - 1.24 mg/dL   Calcium 9.6 8.9 - 89.6 mg/dL   Total Protein 6.9 6.5 - 8.1 g/dL   Albumin 4.2 3.5 - 5.0 g/dL   AST 25 15 - 41 U/L   ALT 12 0 - 44 U/L   Alkaline Phosphatase 73 38 - 126 U/L   Total Bilirubin 1.1 0.0 - 1.2 mg/dL   GFR, Estimated 27 (L) >60 mL/min    Comment: (NOTE) Calculated using the CKD-EPI Creatinine Equation (2021)    Anion gap 11 5 - 15    Comment: Performed at [facility], [address], [place], [place] [address]  CBC     Status: Abnormal   Collection Time: 08/06/23  3:13 PM  Result Value Ref Range   WBC 2.9 (L) 4.0 - 10.5 K/uL   RBC 2.85 (L) 4.22 - 5.81 MIL/uL   Hemoglobin 9.0 (L) 13.0 - 17.0 g/dL   HCT 71.3 (L) 60.9 - 47.9 %   MCV 100.4 (H) 80.0 - 100.0 fL   MCH 31.6 26.0 - 34.0 pg   MCHC 31.5 30.0 - 36.0 g/dL   RDW 82.8 (H) 88.4 - 84.4 %   Platelets 38 (L) 150 - 400 K/uL    Comment: SPECIMEN CHECKED FOR CLOTS Immature Platelet Fraction may be clinically indicated, consider ordering this additional test [id number] REPEATED TO VERIFY PLATELET COUNT CONFIRMED BY SMEAR    nRBC 1.0 (H) 0.0 - 0.2 %    Comment: Performed at [facility], [address] [place], [place] [address]  CBG monitoring, ED     Status: Abnormal   Collection Time: 08/06/23  3:22 PM  Result Value Ref Range   Glucose-Capillary 122 (H) 70 - 99 mg/dL    Comment: Glucose reference range applies  only to samples taken after fasting for at least 8 hours.  Urinalysis, Routine w reflex microscopic -Urine, Clean Catch     Status: Abnormal   Collection Time: 08/06/23  8:00 PM  Result Value Ref Range   Color, Urine YELLOW YELLOW   APPearance HAZY (A) CLEAR   Specific Gravity, Urine 1.015 1.005 - 1.030   pH 5.0 5.0 - 8.0   Glucose, UA NEGATIVE NEGATIVE mg/dL   Hgb urine dipstick NEGATIVE NEGATIVE   Bilirubin Urine NEGATIVE NEGATIVE   Ketones, ur NEGATIVE NEGATIVE mg/dL   Protein, ur 30 (A) NEGATIVE mg/dL   Nitrite NEGATIVE NEGATIVE   Leukocytes,Ua NEGATIVE NEGATIVE   RBC / HPF 0-5 0 - 5 RBC/hpf   WBC, UA 6-10 0 - 5 WBC/hpf   Bacteria, UA RARE (A) NONE SEEN   Squamous Epithelial / HPF 0-5 0 - 5 /HPF   Mucus PRESENT     Comment: Performed at [facility], [address], [place], [place] [address]   MR LUMBAR SPINE WO CONTRAST Result Date: 08/06/2023 CLINICAL DATA:  Myelopathy, acute, lumbar spine. EXAM: MRI LUMBAR SPINE WITHOUT CONTRAST TECHNIQUE: Multiplanar, multisequence MR imaging of the lumbar spine was performed. No intravenous contrast was administered. COMPARISON:  None Available. FINDINGS: Segmentation:  Standard. Alignment:  No substantial sagittal subluxation. Vertebrae: Diffusely low T1 signal throughout the bone marrow with heterogeneous signal in the sacral vertebral bodies. No evidence of acute fracture or discitis/osteomyelitis. Conus medullaris and cauda equina: Conus extends to the L1-L2 level. Conus and cauda equina appear normal. Paraspinal and other soft tissues: There appears to be intermediate extraosseous signal surrounding the vertebral bodies at multiple levels (for example see series 9, image 28 and series 9, image 20), possibly extraosseous extension of tumor but not well assessed without contrast. Please see same day CT of the abdomen/pelvis for description of retroperitoneal nodal mass and other intra-abdominal intrapelvic findings. Disc levels:  T12-L1: No significant disc protrusion, foraminal stenosis, or canal stenosis. L1-L2: No significant disc protrusion, foraminal stenosis, or canal stenosis. L2-L3: No significant disc protrusion, foraminal stenosis, or canal stenosis. L3-L4: No significant disc protrusion, foraminal stenosis, or canal stenosis. L4-L5: Mild disc bulge and endplate spurring without significant stenosis. L5-S1: Question subtle intermediate signal along the posterior aspect of the L5 vertebral body which partially effaces ventral CSF. Mild disc bulging at the  disc level without significant canal or foraminal stenosis. Sacral canal: Question mild intermediate signal in the ventral canal without compressive stenosis. IMPRESSION: 1. Diffusely abnormal marrow signal, likely related to the patient's underlying lymphoma. 2. Masslike extraosseous paraspinal signal abnormality at multiple levels, and L4 and subtle, equivacal signal in the ventral canal in the lower lumbar canal and sacral canal could potentially represent extraosseous extension of tumor but is incompletely assessed without contrast. If the patient is able, consider postcontrast imaging. 3. No compressive canal or foraminal stenosis. Electronically Signed   By: Gilmore GORMAN Molt M.D.   On: 08/06/2023 19:37   CT ABDOMEN PELVIS WO CONTRAST Result Date: 08/06/2023 CLINICAL DATA:  Chronic lymphocytic leukemia. Acute abdominal pain. * Tracking Code: BO * EXAM: CT ABDOMEN AND PELVIS WITHOUT CONTRAST TECHNIQUE: Multidetector CT imaging of the abdomen and pelvis was performed following the standard protocol without IV contrast. RADIATION DOSE REDUCTION: This exam was performed according to the departmental dose-optimization program which includes automated exposure control, adjustment of the mA and/or kV according to patient size and/or use of iterative reconstruction technique. COMPARISON:  PET-CT 03/05/2023 FINDINGS: Lower chest: Thickened paraspinal tissue in the posterior  mediastinum (image 8/2). Findings consistent lymphoma. Hepatobiliary: No focal hepatic lesion. Normal gallbladder. No biliary duct dilatation. Common bile duct is normal. Pancreas: Pancreas is normal. No ductal dilatation. No pancreatic inflammation. Spleen: Spleen is mildly enlarged at 14. Cm in craniocaudad dimension Adrenals/urinary tract: Adrenal glands and kidneys are normal. The ureters and bladder normal. Stomach/Bowel: Stomach, small bowel, appendix, and cecum are normal. The colon and rectosigmoid colon are normal. Vascular/Lymphatic: Bulky retroperitoneal lymphadenopathy increased from comparison exam. Retroperitoneal nodal mass at the level of the lower pole LEFT kidney LEFT aorta measures 7.4 cm (image 46/2) compared to 5.1 cm comparison PET-CT scan. The aorta is elevated by 19 mm by retroperitoneal adenopathy compared to 8 mm on prior. Example node ventral to the aorta measuring 33 mm on image 52 compares to 23 mm. Bulky adenopathy extends along the iliac chains. LEFT external iliac lymph node anterior to the operator space measuring 22 mm compared to 13 mm. Minimal inguinal adenopathy. Reproductive: Prostate unremarkable Other: No free fluid. Musculoskeletal: No aggressive osseous lesion. Posterior fusion of the L4 spinous processes IMPRESSION: 1. Progression of lymphoma lymphadenopathy with increase in bulky retroperitoneal nodal mass. 2. Increase in perispinal thickening in the posterior mediastinum. 3. Mild splenomegaly. 4. No bowel obstruction. Electronically Signed   By: Jackquline Boxer M.D.   On: 08/06/2023 18:20    Pending Labs Unresulted Labs (From admission, onward)     Start     Ordered   08/07/23 0500  CBC with Differential/Platelet  Daily,   R      08/06/23 2201   08/07/23 0500  Comprehensive metabolic panel  Daily,   R      08/06/23 2201   08/06/23 2200  Uric acid  Once,   R        08/06/23 2159   08/06/23 2200  Lactate dehydrogenase  Once,   R        08/06/23 2159    08/06/23 2132  Hemoglobin A1c  Once,   URGENT       Comments: To assess prior glycemic control    08/06/23 2131   08/06/23 2131  Culture, blood (Routine X 2) w Reflex to ID Panel  BLOOD CULTURE X 2,   R (with STAT occurrences)      08/06/23 2130   Signed and Held  HIV Antibody (  routine testing w rflx)  (HIV Antibody (Routine testing w reflex) panel)  Once,   R        Signed and Held   Signed and Held  CBC  Tomorrow morning,   R        Signed and Held   Signed and Held  Comprehensive metabolic panel  Tomorrow morning,   R        Signed and Held            Vitals/Pain Today's Vitals   08/06/23 1637 08/06/23 1730 08/06/23 1947 08/06/23 2024  BP: (!) 98/54 (!) 115/59 (!) 141/68   Pulse: 84 85 88   Resp: 18 (!) 21 18   Temp:   98.1 F (36.7 C)   TempSrc:   Oral   SpO2: 100% 100% 100%   Weight:      Height:      PainSc:   6  8     Isolation Precautions No active isolations  Medications Medications  finasteride (PROPECIA) tablet 1 mg (has no administration in time range)  0.9 %  sodium chloride  infusion (has no administration in time range)  morphine (PF) 2 MG/ML injection 2 mg (has no administration in time range)  lidocaine (LIDODERM) 5 % 1 patch (has no administration in time range)  gabapentin (NEURONTIN) capsule 100 mg (has no administration in time range)  insulin  aspart (novoLOG) injection 0-9 Units (has no administration in time range)  allopurinol  (ZYLOPRIM ) tablet 150 mg (has no administration in time range)  acyclovir  (ZOVIRAX ) tablet 400 mg (has no administration in time range)  simvastatin  (ZOCOR ) tablet 20 mg (has no administration in time range)  insulin  glargine-yfgn (SEMGLEE ) injection 8 Units (has no administration in time range)  sodium chloride  0.9 % bolus 1,000 mL (0 mLs Intravenous Stopped 08/06/23 1948)  morphine (PF) 4 MG/ML injection 4 mg (4 mg Intravenous Given 08/06/23 2024)  ondansetron  (ZOFRAN ) injection 4 mg (4 mg Intravenous Given 08/06/23 2023)     Mobility non-ambulatory

## 2023-08-06 NOTE — H&P (Signed)
 History and Physical    Tyler Scott FMW:[id number] DOB: 07-19-1955 DOA: 08/06/2023  PCP: [name], MD Patient coming from:  home  I have personally briefly reviewed patient's old medical records in [facility]  Chief Complaint: low back pain, not able to walk for three days, missed oncology appointment due to not able to walk  HPI: Tyler Scott is a 68 y.o. male with medical history significant of IDDM2, HTN, CLL/SLL present to [place] [facility] ED due to progressive low back pain radiating down to both legs, not able to walk for three days. He reports has  been having low back pain for about a month, but pain progressed and he could not walk for the last three days with decreased oral intake. He denies fever, reports reduced night sweat compare to before.   ED Course:   Data reviewed:  Blood pressure (!) 141/68, pulse 88, temperature 98.1 F (36.7 C), temperature source Oral, resp. rate 18, height 5' 8 (1.727 m), weight 65.8 kg, SpO2 100%.   Labs Reviewed  COMPREHENSIVE METABOLIC PANEL WITH GFR - Abnormal; Notable for the following components:      Result Value   Sodium 134 (*)    Glucose, Bld 111 (*)    BUN 45 (*)    Creatinine, Ser 2.56 (*)    GFR, Estimated 27 (*)    All other components within normal limits  CBC - Abnormal; Notable for the following components:   WBC 2.9 (*)    RBC 2.85 (*)    Hemoglobin 9.0 (*)    HCT 28.6 (*)    MCV 100.4 (*)    RDW 17.1 (*)    Platelets 38 (*)    nRBC 1.0 (*)    All other components within normal limits  URINALYSIS, ROUTINE W REFLEX MICROSCOPIC - Abnormal; Notable for the following components:   APPearance HAZY (*)    Protein, ur 30 (*)    Bacteria, UA RARE (*)    All other components within normal limits  CBG MONITORING, ED - Abnormal; Notable for the following components:   Glucose-Capillary 122 (*)    All other components within normal limits     Urine analysis:    Component Value Date/Time    COLORURINE YELLOW 08/06/2023 2000   APPEARANCEUR HAZY (A) 08/06/2023 2000   LABSPEC 1.015 08/06/2023 2000   PHURINE 5.0 08/06/2023 2000   GLUCOSEU NEGATIVE 08/06/2023 2000   HGBUR NEGATIVE 08/06/2023 2000   HGBUR negative 07/09/2009 0853   BILIRUBINUR NEGATIVE 08/06/2023 2000   BILIRUBINUR n 06/30/2016 1551   KETONESUR NEGATIVE 08/06/2023 2000   PROTEINUR 30 (A) 08/06/2023 2000   UROBILINOGEN 0.2 06/30/2016 1551   UROBILINOGEN 0.2 07/09/2009 0853   NITRITE NEGATIVE 08/06/2023 2000   LEUKOCYTESUR NEGATIVE 08/06/2023 2000      MR LUMBAR SPINE WO CONTRAST Result Date: 08/06/2023 CLINICAL DATA:  Myelopathy, acute, lumbar spine. EXAM: MRI LUMBAR SPINE WITHOUT CONTRAST TECHNIQUE: Multiplanar, multisequence MR imaging of the lumbar spine was performed. No intravenous contrast was administered. COMPARISON:  None Available. FINDINGS: Segmentation:  Standard. Alignment:  No substantial sagittal subluxation. Vertebrae: Diffusely low T1 signal throughout the bone marrow with heterogeneous signal in the sacral vertebral bodies. No evidence of acute fracture or discitis/osteomyelitis. Conus medullaris and cauda equina: Conus extends to the L1-L2 level. Conus and cauda equina appear normal. Paraspinal and other soft tissues: There appears to be intermediate extraosseous signal surrounding the vertebral bodies at multiple levels (for example see series 9, image 28  and series 9, image 20), possibly extraosseous extension of tumor but not well assessed without contrast. Please see same day CT of the abdomen/pelvis for description of retroperitoneal nodal mass and other intra-abdominal intrapelvic findings. Disc levels: T12-L1: No significant disc protrusion, foraminal stenosis, or canal stenosis. L1-L2: No significant disc protrusion, foraminal stenosis, or canal stenosis. L2-L3: No significant disc protrusion, foraminal stenosis, or canal stenosis. L3-L4: No significant disc protrusion, foraminal stenosis, or  canal stenosis. L4-L5: Mild disc bulge and endplate spurring without significant stenosis. L5-S1: Question subtle intermediate signal along the posterior aspect of the L5 vertebral body which partially effaces ventral CSF. Mild disc bulging at the disc level without significant canal or foraminal stenosis. Sacral canal: Question mild intermediate signal in the ventral canal without compressive stenosis. IMPRESSION: 1. Diffusely abnormal marrow signal, likely related to the patient's underlying lymphoma. 2. Masslike extraosseous paraspinal signal abnormality at multiple levels, and L4 and subtle, equivacal signal in the ventral canal in the lower lumbar canal and sacral canal could potentially represent extraosseous extension of tumor but is incompletely assessed without contrast. If the patient is able, consider postcontrast imaging. 3. No compressive canal or foraminal stenosis. Electronically Signed   By: Gilmore GORMAN Molt M.D.   On: 08/06/2023 19:37   CT ABDOMEN PELVIS WO CONTRAST Result Date: 08/06/2023 CLINICAL DATA:  Chronic lymphocytic leukemia. Acute abdominal pain. * Tracking Code: BO * EXAM: CT ABDOMEN AND PELVIS WITHOUT CONTRAST TECHNIQUE: Multidetector CT imaging of the abdomen and pelvis was performed following the standard protocol without IV contrast. RADIATION DOSE REDUCTION: This exam was performed according to the departmental dose-optimization program which includes automated exposure control, adjustment of the mA and/or kV according to patient size and/or use of iterative reconstruction technique. COMPARISON:  PET-CT 03/05/2023 FINDINGS: Lower chest: Thickened paraspinal tissue in the posterior mediastinum (image 8/2). Findings consistent lymphoma. Hepatobiliary: No focal hepatic lesion. Normal gallbladder. No biliary duct dilatation. Common bile duct is normal. Pancreas: Pancreas is normal. No ductal dilatation. No pancreatic inflammation. Spleen: Spleen is mildly enlarged at 14. Cm in  craniocaudad dimension Adrenals/urinary tract: Adrenal glands and kidneys are normal. The ureters and bladder normal. Stomach/Bowel: Stomach, small bowel, appendix, and cecum are normal. The colon and rectosigmoid colon are normal. Vascular/Lymphatic: Bulky retroperitoneal lymphadenopathy increased from comparison exam. Retroperitoneal nodal mass at the level of the lower pole LEFT kidney LEFT aorta measures 7.4 cm (image 46/2) compared to 5.1 cm comparison PET-CT scan. The aorta is elevated by 19 mm by retroperitoneal adenopathy compared to 8 mm on prior. Example node ventral to the aorta measuring 33 mm on image 52 compares to 23 mm. Bulky adenopathy extends along the iliac chains. LEFT external iliac lymph node anterior to the operator space measuring 22 mm compared to 13 mm. Minimal inguinal adenopathy. Reproductive: Prostate unremarkable Other: No free fluid. Musculoskeletal: No aggressive osseous lesion. Posterior fusion of the L4 spinous processes IMPRESSION: 1. Progression of lymphoma lymphadenopathy with increase in bulky retroperitoneal nodal mass. 2. Increase in perispinal thickening in the posterior mediastinum. 3. Mild splenomegaly. 4. No bowel obstruction. Electronically Signed   By: Jackquline Boxer M.D.   On: 08/06/2023 18:20   Medications Ordered in the ED  sodium chloride  0.9 % bolus 1,000 mL (0 mLs Intravenous Stopped 08/06/23 1948)  morphine (PF) 4 MG/ML injection 4 mg (4 mg Intravenous Given 08/06/23 2024)  ondansetron  (ZOFRAN ) injection 4 mg (4 mg Intravenous Given 08/06/23 2023)      Review of Systems: As per HPI otherwise all other  systems reviewed and are negative.   Past Medical History:  Diagnosis Date   Allergy    Arthritis    CLL (chronic lymphocytic leukemia) (HCC)    stage III: asymptomatic; on observation   DIABETES MELLITUS, TYPE I, UNCONTROLLED 12/30/2006   insulin    Diabetic retinopathy    ERECTILE DYSFUNCTION, ORGANIC 12/30/2006   Hx of adenomatous polyp of  colon 05/24/2014   HYPERLIPIDEMIA 12/30/2006   HYPERTENSION 12/30/2006   LOW BACK PAIN, ACUTE 12/21/2006    Past Surgical History:  Procedure Laterality Date   COLONOSCOPY     great right toe- Fused the I P joint      Social History  reports that he has never smoked. He has never used smokeless tobacco. He reports current alcohol use of about 4.0 standard drinks of alcohol per week. He reports that he does not use drugs.  No Known Allergies  Family History  Problem Relation Age of Onset   Alcohol abuse Other    Colon cancer Brother 31       Died Sep 28, 2013   Diabetes Mellitus II Mother    Esophageal cancer Neg Hx    Rectal cancer Neg Hx    Stomach cancer Neg Hx     Prior to Admission medications   Medication Sig Start Date End Date Taking? Authorizing Provider  acyclovir  (ZOVIRAX ) 400 MG tablet Take 1 tablet (400 mg total) by mouth daily. 03/24/23   Kale, Gautam Kishore, MD  allopurinol  (ZYLOPRIM ) 300 MG tablet Take 0.5 tablets (150 mg total) by mouth daily. 03/24/23   Kale, Gautam Kishore, MD  Blood Glucose Monitoring Suppl (ONE TOUCH ULTRA 2) W/DEVICE KIT See admin instructions. 07/25/14   [provider]  Cholecalciferol 50 MCG (2000 UT) TABS TAKE ONE TABLET BY MOUTH DAILY FOR LOW VITAMIN D. 04/29/20   [provider]  doxepin  (SINEQUAN ) 25 MG capsule Take 1 capsule by mouth at bedtime. 11/29/20   [provider]  empagliflozin (JARDIANCE) 25 MG TABS tablet Take 0.5 tablets by mouth every morning. Only if blood sugars get too high 05/30/21   [provider]  finasteride (PROPECIA) 1 MG tablet Take 1 tablet by mouth daily. 10/21/16   [provider]  finasteride (PROSCAR) 5 MG tablet Take 0.5 tablets by mouth daily. 05/01/20   [provider]  Fluocin-Hydroquinone-Tretinoin (TRI-LUMA ) 0.01-4-0.05 % CREA Apply 1 application. topically at bedtime. Patient not taking: Reported on 04/20/2023 04/28/21   Livingston Rigg, MD  Insulin  Glargine  (LANTUS  SOLOSTAR) 100 UNIT/ML Solostar Pen INJECT 20 UNITS SUBCUTANEOUSLY AT BEDTIME 06/30/16   Krystal Reyes LABOR, MD  INSULIN  GLARGINE, 1 UNIT DIAL, [place] INJECT 20 UNITS SUBCUTANEOUSLY DAILY Patient not taking: Reported on 04/20/2023 10/21/16   [provider]  insulin  glargine-yfgn (SEMGLEE ) 100 UNIT/ML Pen INJECT 22 UNITS SUBCUTANEOUSLY AT BEDTIME (USE WITHIN 28 DAYS AFTER OPENING PEN) (CONVERTED FROM LANTUS ) 11/29/20   [provider]  lisinopril  (PRINIVIL ,ZESTRIL ) 40 MG tablet Take 1 tablet (40 mg total) by mouth daily. 06/30/16   Krystal Reyes LABOR, MD  meloxicam  (MOBIC ) 7.5 MG tablet TAKE 1 TABLET (7.5 MG TOTAL) BY MOUTH DAILY. Patient taking differently: TAKE 1 TABLET (7.5 MG TOTAL) BY MOUTH DAILY. As needed 10/07/16   Krystal Reyes LABOR, MD  ondansetron  (ZOFRAN ) 8 MG tablet Take 1 tablet (8 mg total) by mouth every 8 (eight) hours as needed for nausea or vomiting. 03/24/23   Onesimo Emaline Brink, MD  ONE TOUCH ULTRA TEST test strip TEST GLUCOSE 3 TIMES A DAY  10/07/16   Krystal Reyes LABOR, MD  [facility] DELICA LANCETS 33G MISC TEST GLUCOSE 3 TIMES A DAY 10/07/16   Krystal Reyes LABOR, MD  prochlorperazine  (COMPAZINE ) 10 MG tablet Take 1 tablet (10 mg total) by mouth every 6 (six) hours as needed for nausea or vomiting. 03/24/23   Onesimo Emaline Brink, MD  sildenafil  (REVATIO ) 20 MG tablet Take 1 to 2 tabs 2 - 3 hours before sex 06/30/16   Krystal Reyes LABOR, MD  simvastatin  (ZOCOR ) 20 MG tablet TAKE 1 TABLET (20 MG TOTAL) BY MOUTH AT BEDTIME. 06/30/16   Krystal Reyes LABOR, MD  venetoclax  (VENCLEXTA ) 10 & 50 & 100 MG Starter Pack Take by mouth daily. Take 20 mg for 7 days, then 50 mg daily x 7d, then 100 mg daily x 7d, then 200 mg daily x 7d. Take with food & water. 05/12/23   Onesimo Emaline Brink, MD    Physical Exam: Vitals:   08/06/23 1506 08/06/23 1637 08/06/23 1730 08/06/23 1947  BP: (!) 94/50 (!) 98/54 (!) 115/59 (!) 141/68  Pulse: 86 84 85 88  Resp: 18 18 (!) 21 18  Temp: (!) 97.5 F (36.4 C)    98.1 F (36.7 C)  TempSrc: Oral   Oral  SpO2: 100% 100% 100% 100%  Weight: 65.8 kg     Height: 5' 8 (1.727 m)       Constitutional: frail, weak, chronically ill appearing, NAD Eyes: PERRL, lids and conjunctivae normal ENMT: Mucous membranes are moist.  Respiratory: clear to auscultation bilaterally, no wheezing, no crackles. Normal respiratory effort. No accessory muscle use.  Cardiovascular: Regular rate and rhythm,  No extremity edema. 2+ pedal pulses. No carotid bruits.  Abdomen: no tenderness, not distended, Bowel sounds positive.  Musculoskeletal: no clubbing / cyanosis. No joint deformity upper and lower extremities. Not able to lift legs due to back pain.  Skin: no rashes, lesions, ulcers. No induration Neurologic: CN 2-12 grossly intact. Sensation intact, Strength 5/5 in all 4.  Psychiatric: Normal judgment and insight. Alert and oriented x 3. Normal mood.    Assessment/Plan Principal Problem:   AKI (acute kidney injury) (HCC) Active Problems:   CLL (chronic lymphocytic leukemia) (HCC)   Insulin  dependent type 2 diabetes mellitus (HCC)   Pancytopenia (HCC)     Pancytopenia H/o CLL/SLL with Progression of lymphadenopathy with increase in bulky retroperitoneal nodal mass .Masslike extraosseous paraspinal signal abnormality at multiple levels, No compressive canal or foraminal stenosis.  Last received gazyva  on 06/08/2023 Case discussed with oncology Dr Timmy who will see patient in consult in am  Hyponatremia Likely from dehydration, continue hydration  AKI Ct ab/pel  no obstructive nephrolopathy UA rare bacteria, no leuks, no rbc, negative nitrite  Discontinue NSAIDs Continue hydration, repeat BMP in the morning, renal dosing meds  IDDM2 Lab Results  Component Value Date   HGBA1C 10.3 (H) 06/30/2016  Update A1c, lower long acting insulin  dose, add ssi, continue adjust insulin     Failure to thrive Generalized weakness  Fall precaution PT eval   DVT  prophylaxis: scd's   Code Status:   Full code  Family Communication:    Patient is from: home   Anticipated DC to:  TBD  Anticipated DC date:  TBD   Consults called:  Oncology  Admission status:  Inpatient   Severity of Illness:   The appropriate patient status for this patient is INPATIENT due to history and comorbidities, severity of illness, required intensity of service to ensure the patient's safety and  to avoid risk of adverse events/further clinical deterioration.  Severity of illness/comorbidities: CLL, possible transformation to lymphoma, pancytopenia, AKI, unable to ambulate Intensity of service: tests, high frequency of surveillance, interventions It is not anticipated that the patient will be medically stable for discharge from the hospital within 2 midnights of admission.    Voice Recognition Betti dictation system was used to create this note, attempts have been made to correct errors. Please contact the author with questions and/or clarifications.  Ileana Cummins MD PhD FACP [facility]  How to contact the [facility] Attending or Consulting provider 7A - 7P or covering provider during after hours 7P -7A, for this patient?   Check the care team in [facility] and look for a) attending/consulting [facility] provider listed and b) the [facility] team listed Log into [email or web address] and use [facility] universal password to access. If you do not have the password, please contact the hospital operator. Locate the [facility] provider you are looking for under [facility] and page to a number that you can be directly reached. If you still have difficulty reaching the provider, please page the [facility] (Director on Call) for the Hospitalists listed on amion for assistance.  08/06/2023, 9:42 PM

## 2023-08-07 ENCOUNTER — Inpatient Hospital Stay (HOSPITAL_COMMUNITY)

## 2023-08-07 DIAGNOSIS — Z0189 Encounter for other specified special examinations: Secondary | ICD-10-CM

## 2023-08-07 DIAGNOSIS — N179 Acute kidney failure, unspecified: Secondary | ICD-10-CM

## 2023-08-07 LAB — CBC WITH DIFFERENTIAL/PLATELET
Abs Immature Granulocytes: 0.04 10*3/uL (ref 0.00–0.07)
Basophils Absolute: 0 10*3/uL (ref 0.0–0.1)
Basophils Relative: 1 %
Eosinophils Absolute: 0 10*3/uL (ref 0.0–0.5)
Eosinophils Relative: 1 %
HCT: 28.9 % — ABNORMAL LOW (ref 39.0–52.0)
Hemoglobin: 8.9 g/dL — ABNORMAL LOW (ref 13.0–17.0)
Immature Granulocytes: 2 %
Lymphocytes Relative: 53 %
Lymphs Abs: 1.1 10*3/uL (ref 0.7–4.0)
MCH: 31.7 pg (ref 26.0–34.0)
MCHC: 30.8 g/dL (ref 30.0–36.0)
MCV: 102.8 fL — ABNORMAL HIGH (ref 80.0–100.0)
Monocytes Absolute: 0.2 10*3/uL (ref 0.1–1.0)
Monocytes Relative: 9 %
Neutro Abs: 0.7 10*3/uL — ABNORMAL LOW (ref 1.7–7.7)
Neutrophils Relative %: 34 %
Platelets: 30 10*3/uL — ABNORMAL LOW (ref 150–400)
RBC: 2.81 MIL/uL — ABNORMAL LOW (ref 4.22–5.81)
RDW: 16.9 % — ABNORMAL HIGH (ref 11.5–15.5)
WBC: 2.1 10*3/uL — ABNORMAL LOW (ref 4.0–10.5)
nRBC: 1 % — ABNORMAL HIGH (ref 0.0–0.2)

## 2023-08-07 LAB — ECHOCARDIOGRAM COMPLETE
AR max vel: 3.18 cm2
AV Area VTI: 3.18 cm2
AV Area mean vel: 3.22 cm2
AV Mean grad: 4 mmHg
AV Peak grad: 7.8 mmHg
Ao pk vel: 1.4 m/s
Area-P 1/2: 3.99 cm2
Height: 68 in
S' Lateral: 2.6 cm
Weight: 2320 [oz_av]

## 2023-08-07 LAB — GLUCOSE, CAPILLARY
Glucose-Capillary: 99 mg/dL (ref 70–99)
Glucose-Capillary: 143 mg/dL — ABNORMAL HIGH (ref 70–99)
Glucose-Capillary: 137 mg/dL — ABNORMAL HIGH (ref 70–99)
Glucose-Capillary: 147 mg/dL — ABNORMAL HIGH (ref 70–99)

## 2023-08-07 LAB — COMPREHENSIVE METABOLIC PANEL WITH GFR
ALT: 10 U/L (ref 0–44)
AST: 20 U/L (ref 15–41)
Albumin: 4 g/dL (ref 3.5–5.0)
Alkaline Phosphatase: 71 U/L (ref 38–126)
Anion gap: 9 (ref 5–15)
BUN: 40 mg/dL — ABNORMAL HIGH (ref 8–23)
CO2: 21 mmol/L — ABNORMAL LOW (ref 22–32)
Calcium: 9.2 mg/dL (ref 8.9–10.3)
Chloride: 105 mmol/L (ref 98–111)
Creatinine, Ser: 2.22 mg/dL — ABNORMAL HIGH (ref 0.61–1.24)
GFR, Estimated: 32 mL/min — ABNORMAL LOW (ref >60)
Glucose, Bld: 120 mg/dL — ABNORMAL HIGH (ref 70–99)
Potassium: 4.4 mmol/L (ref 3.5–5.1)
Sodium: 135 mmol/L (ref 135–145)
Total Bilirubin: 0.9 mg/dL (ref 0.0–1.2)
Total Protein: 6.6 g/dL (ref 6.5–8.1)

## 2023-08-07 LAB — URIC ACID: Uric Acid, Serum: 8.8 mg/dL — ABNORMAL HIGH (ref 3.7–8.6)

## 2023-08-07 LAB — HEMOGLOBIN A1C
Hgb A1c MFr Bld: 5.8 % — ABNORMAL HIGH (ref 4.8–5.6)
Mean Plasma Glucose: 119.76 mg/dL

## 2023-08-07 LAB — LACTATE DEHYDROGENASE: LDH: 203 U/L — ABNORMAL HIGH (ref 98–192)

## 2023-08-07 MED ORDER — ENSURE PLUS HIGH PROTEIN PO LIQD
237.0000 mL | Freq: Two times a day (BID) | ORAL | Status: DC
  Administered 2023-08-08 (×2): 237 mL via ORAL

## 2023-08-07 MED ORDER — OXYCODONE HCL 5 MG PO TABS
5.0000 mg | ORAL_TABLET | ORAL | Status: DC | PRN
  Administered 2023-08-07 – 2023-08-10 (×5): 5 mg via ORAL
  Filled 2023-08-07 (×5): qty 1

## 2023-08-07 MED ORDER — HYDROMORPHONE HCL 1 MG/ML IJ SOLN
0.5000 mg | INTRAMUSCULAR | Status: DC | PRN
  Administered 2023-08-10 – 2023-08-13 (×3): 0.5 mg via INTRAVENOUS
  Filled 2023-08-07 (×3): qty 0.5

## 2023-08-07 MED ORDER — SODIUM CHLORIDE 0.9 % IV SOLN
6.0000 mg | Freq: Once | INTRAVENOUS | Status: AC
  Administered 2023-08-07: 6 mg via INTRAVENOUS
  Filled 2023-08-07: qty 4

## 2023-08-07 MED ORDER — HEPARIN SODIUM (PORCINE) 5000 UNIT/ML IJ SOLN
5000.0000 [IU] | Freq: Three times a day (TID) | INTRAMUSCULAR | Status: DC
  Administered 2023-08-07 – 2023-08-08 (×3): 5000 [IU] via SUBCUTANEOUS
  Filled 2023-08-07 (×3): qty 1

## 2023-08-07 NOTE — Progress Notes (Signed)
 Inpatient Rehab Admissions Coordinator Note:   Per PT patient was screened for CIR candidacy by Eann Cleland SHAUNNA Yvone Cohens, CCC-SLP. At this time, pt still undergoing medical workup. Admissions team will follow to monitor for medical readiness. A consult order will be placed it pt appears to be an appropriate candidate.    Tinnie Yvone Cohens, MS, CCC-SLP Admissions Coordinator [phone] 08/07/23 5:45 PM

## 2023-08-07 NOTE — Progress Notes (Signed)
   08/07/23 0849  TOC Brief Assessment  Insurance and Status Reviewed  Patient has primary care physician Yes  Home environment has been reviewed home w/ spouse  Prior level of function: independent  Prior/Current Home Services No current home services  Social Drivers of Health Review SDOH reviewed no interventions necessary  Readmission risk has been reviewed Yes  Transition of care needs transition of care needs identified, TOC will continue to follow

## 2023-08-07 NOTE — Progress Notes (Signed)
*  PRELIMINARY RESULTS* Echocardiogram 2D Echocardiogram has been performed.  Tyler Scott 08/07/2023, 11:04 AM

## 2023-08-07 NOTE — Consult Note (Signed)
 Chief Complaint: Patient was seen in consultation today for LN biopsy   Referring Physician(s): Dr. Timmy Dr. Onesimo  Supervising Physician: Hughes Simmonds  Patient Status: [facility] - In-pt  History of Present Illness: Tyler Scott is a 68 y.o. male with hx of CLL followed by Dr. Onesimo. He presented with back pain and LE weakness. Workup for progressive adenopathy including bulky retroperitoneal adenopathy. He was seen by Dr. Timmy who recommends percutaneous LN biopsy for tissue sampling.  PMHx, meds, labs, imaging, allergies reviewed. Feels feels okay this am. No recent fevers, chills, illness.  IR is consulted for biopsy.  Past Medical History:  Diagnosis Date   Allergy    Arthritis    CLL (chronic lymphocytic leukemia) (HCC)    stage III: asymptomatic; on observation   DIABETES MELLITUS, TYPE I, UNCONTROLLED 12/30/2006   insulin    Diabetic retinopathy    ERECTILE DYSFUNCTION, ORGANIC 12/30/2006   Hx of adenomatous polyp of colon 05/24/2014   HYPERLIPIDEMIA 12/30/2006   HYPERTENSION 12/30/2006   LOW BACK PAIN, ACUTE 12/21/2006    Past Surgical History:  Procedure Laterality Date   COLONOSCOPY     great right toe- Fused the I P joint      Allergies: Patient has no known allergies.  Medications: Prior to Admission medications   Medication Sig Start Date End Date Taking? Authorizing Provider  Cholecalciferol 50 MCG (2000 UT) TABS TAKE ONE TABLET BY MOUTH DAILY FOR LOW VITAMIN D. 04/29/20  Yes [provider]  doxepin  (SINEQUAN ) 25 MG capsule Take 1 capsule by mouth at bedtime. 11/29/20  Yes [provider]  empagliflozin (JARDIANCE) 25 MG TABS tablet Take 0.5 tablets by mouth every morning. Only if blood sugars get too high 05/30/21  Yes [provider]  finasteride (PROSCAR) 5 MG tablet Take 0.5 tablets by mouth daily. 05/01/20  Yes [provider]  Insulin  Glargine (LANTUS  SOLOSTAR) 100 UNIT/ML Solostar Pen INJECT 20  UNITS SUBCUTANEOUSLY AT BEDTIME 06/30/16  Yes Krystal Reyes LABOR, MD  lisinopril  (PRINIVIL ,ZESTRIL ) 40 MG tablet Take 1 tablet (40 mg total) by mouth daily. 06/30/16  Yes Krystal Reyes LABOR, MD  ondansetron  (ZOFRAN ) 8 MG tablet Take 1 tablet (8 mg total) by mouth every 8 (eight) hours as needed for nausea or vomiting. 03/24/23  Yes Onesimo Emaline Brink, MD  simvastatin  (ZOCOR ) 20 MG tablet TAKE 1 TABLET (20 MG TOTAL) BY MOUTH AT BEDTIME. 06/30/16  Yes Krystal Reyes LABOR, MD  acyclovir  (ZOVIRAX ) 400 MG tablet Take 1 tablet (400 mg total) by mouth daily. Patient not taking: Reported on 08/07/2023 03/24/23   Onesimo Emaline Brink, MD  allopurinol  (ZYLOPRIM ) 300 MG tablet Take 0.5 tablets (150 mg total) by mouth daily. Patient not taking: Reported on 08/07/2023 03/24/23   Kale, Gautam Kishore, MD  Blood Glucose Monitoring Suppl (ONE TOUCH ULTRA 2) W/DEVICE KIT See admin instructions. 07/25/14   [provider]  ONE TOUCH ULTRA TEST test strip TEST GLUCOSE 3 TIMES A DAY 10/07/16   Krystal Reyes LABOR, MD  [facility] DELICA LANCETS 33G MISC TEST GLUCOSE 3 TIMES A DAY 10/07/16   Krystal Reyes LABOR, MD  prochlorperazine  (COMPAZINE ) 10 MG tablet Take 1 tablet (10 mg total) by mouth every 6 (six) hours as needed for nausea or vomiting. Patient not taking: Reported on 08/07/2023 03/24/23   Onesimo Emaline Brink, MD  venetoclax  (VENCLEXTA ) 10 & 50 & 100 MG Starter Pack Take by mouth daily. Take 20 mg for 7 days, then 50 mg daily x 7d, then  100 mg daily x 7d, then 200 mg daily x 7d. Take with food & water. Patient not taking: Reported on 08/07/2023 05/12/23   Onesimo Emaline Brink, MD     Family History  Problem Relation Age of Onset   Alcohol abuse Other    Colon cancer Brother 34       Died 2013-09-27   Diabetes Mellitus II Mother    Esophageal cancer Neg Hx    Rectal cancer Neg Hx    Stomach cancer Neg Hx     Social History   Socioeconomic History   Marital status: Married    Spouse name: Not on file   Number of children:  Not on file   Years of education: Not on file   Highest education level: Not on file  Occupational History   Not on file  Tobacco Use   Smoking status: Never   Smokeless tobacco: Never  Vaping Use   Vaping status: Never Used  Substance and Sexual Activity   Alcohol use: Yes    Alcohol/week: 4.0 standard drinks of alcohol    Types: 4 Glasses of wine per week   Drug use: No   Sexual activity: Not on file    Comment: regular exercise - yes  Other Topics Concern   Not on file  Social History Narrative   Not on file   Social Drivers of Health   Financial Resource Strain: Not on file  Food Insecurity: No Food Insecurity (08/06/2023)   Hunger Vital Sign    Worried About Running Out of Food in the Last Year: Never true    Ran Out of Food in the Last Year: Never true  Transportation Needs: No Transportation Needs (08/06/2023)   PRAPARE - Administrator, Civil Service (Medical): No    Lack of Transportation (Non-Medical): No  Physical Activity: Not on file  Stress: Not on file  Social Connections: Socially Isolated (08/06/2023)   Social Connection and Isolation Panel    Frequency of Communication with Friends and Family: Never    Frequency of Social Gatherings with Friends and Family: Once a week    Attends Religious Services: Never    Database administrator or Organizations: No    Attends Engineer, structural: Never    Marital Status: Married    Review of Systems: A 12 point ROS discussed and pertinent positives are indicated in the HPI above.  All other systems are negative.  Review of Systems  Vital Signs: BP 136/70 (BP Location: Left Arm)   Pulse 99   Temp 98.6 F (37 C) (Oral)   Resp 18   Ht 5' 8 (1.727 m)   Wt 145 lb (65.8 kg)   SpO2 98%   BMI 22.05 kg/m   Physical Exam HENT:     Mouth/Throat:     Mouth: Mucous membranes are moist.     Pharynx: Oropharynx is clear.   Cardiovascular:     Rate and Rhythm: Normal rate and regular rhythm.      Heart sounds: Normal heart sounds.  Pulmonary:     Effort: Pulmonary effort is normal. No respiratory distress.     Breath sounds: Normal breath sounds.  Abdominal:     General: There is no distension.     Palpations: Abdomen is soft.   Skin:    General: Skin is warm and dry.   Neurological:     General: No focal deficit present.     Mental Status: He is  alert and oriented to person, place, and time.   Psychiatric:        Mood and Affect: Mood normal.        Thought Content: Thought content normal.     Imaging: CT CHEST WO CONTRAST Result Date: 08/07/2023 CLINICAL DATA:  History of CLL.  Hematologic malignancy staging. EXAM: CT CHEST WITHOUT CONTRAST TECHNIQUE: Multidetector CT imaging of the chest was performed following the standard protocol without IV contrast. RADIATION DOSE REDUCTION: This exam was performed according to the departmental dose-optimization program which includes automated exposure control, adjustment of the mA and/or kV according to patient size and/or use of iterative reconstruction technique. COMPARISON:  CT abdomen and pelvis 08/06/2023.  PET-CT 03/05/2023 FINDINGS: Cardiovascular: Heart size is normal without significant pericardial fluid. Coronary artery calcifications. Atherosclerotic calcifications in thoracic aorta without aneurysm. Mediastinum/Nodes: Compared to the PET-CT from 03/05/2023, there has been enlargement lymph nodes throughout the chest. Nodal tissue in the left supraclavicular area adjacent to the left thyroid  lobe measures 3.2 cm in the short axis on image 17/2 and previously measured 2.7 cm in the short axis. Prominent nodal tissue along the right side of the mediastinum on image 38/2 measures 3.2 cm in the short axis and previously measured 2.9 cm. Index left axillary lymph node on image 36/2 measures 2.6 cm in the short axis previously measured 2.0 cm. This new or increased soft tissue along the posterior mediastinum and paraspinal region on  image 88/2 that measures 2.2 cm in the short axis. There is also soft tissue along the right side of the posterior mediastinum and paraspinal region. Enlarged lymph nodes in the cardiophrenic fat. Enlarged right internal mammary lymph nodes. Lungs/Pleura: Trachea and mainstem bronchi are patent. Trace left pleural fluid with new pleural-based thickening particularly along the paraspinal regions. Volume loss in the posterior lower lobes particularly on the left side. Subtle ground-glass opacities along the anterior right lung adjacent to the right internal mammary chain are nonspecific but favor atelectasis. No suspicious pulmonary nodules. Upper Abdomen: Extensive lymphadenopathy in the upper abdomen. Spleen appears to be enlarged. Musculoskeletal: No acute bone abnormality. No suspicious osseous lesions. IMPRESSION: 1. Progression of the lymph node enlargement throughout the chest compared to the PET-CT from 03/05/2023. Diffuse lymphadenopathy throughout the chest as described. 2. Markedly increased nodal tissue and soft tissue thickening in the posterior mediastinum and paraspinal regions. 3. Volume loss in the posterior lower lobes particularly on the left side. Trace left pleural fluid. 4.  Aortic Atherosclerosis (ICD10-I70.0). Electronically Signed   By: Juliene Balder M.D.   On: 08/07/2023 09:28   MR LUMBAR SPINE WO CONTRAST Result Date: 08/06/2023 CLINICAL DATA:  Myelopathy, acute, lumbar spine. EXAM: MRI LUMBAR SPINE WITHOUT CONTRAST TECHNIQUE: Multiplanar, multisequence MR imaging of the lumbar spine was performed. No intravenous contrast was administered. COMPARISON:  None Available. FINDINGS: Segmentation:  Standard. Alignment:  No substantial sagittal subluxation. Vertebrae: Diffusely low T1 signal throughout the bone marrow with heterogeneous signal in the sacral vertebral bodies. No evidence of acute fracture or discitis/osteomyelitis. Conus medullaris and cauda equina: Conus extends to the L1-L2 level.  Conus and cauda equina appear normal. Paraspinal and other soft tissues: There appears to be intermediate extraosseous signal surrounding the vertebral bodies at multiple levels (for example see series 9, image 28 and series 9, image 20), possibly extraosseous extension of tumor but not well assessed without contrast. Please see same day CT of the abdomen/pelvis for description of retroperitoneal nodal mass and other intra-abdominal intrapelvic findings. Disc levels:  T12-L1: No significant disc protrusion, foraminal stenosis, or canal stenosis. L1-L2: No significant disc protrusion, foraminal stenosis, or canal stenosis. L2-L3: No significant disc protrusion, foraminal stenosis, or canal stenosis. L3-L4: No significant disc protrusion, foraminal stenosis, or canal stenosis. L4-L5: Mild disc bulge and endplate spurring without significant stenosis. L5-S1: Question subtle intermediate signal along the posterior aspect of the L5 vertebral body which partially effaces ventral CSF. Mild disc bulging at the disc level without significant canal or foraminal stenosis. Sacral canal: Question mild intermediate signal in the ventral canal without compressive stenosis. IMPRESSION: 1. Diffusely abnormal marrow signal, likely related to the patient's underlying lymphoma. 2. Masslike extraosseous paraspinal signal abnormality at multiple levels, and L4 and subtle, equivacal signal in the ventral canal in the lower lumbar canal and sacral canal could potentially represent extraosseous extension of tumor but is incompletely assessed without contrast. If the patient is able, consider postcontrast imaging. 3. No compressive canal or foraminal stenosis. Electronically Signed   By: Gilmore GORMAN Molt M.D.   On: 08/06/2023 19:37   CT ABDOMEN PELVIS WO CONTRAST Result Date: 08/06/2023 CLINICAL DATA:  Chronic lymphocytic leukemia. Acute abdominal pain. * Tracking Code: BO * EXAM: CT ABDOMEN AND PELVIS WITHOUT CONTRAST TECHNIQUE:  Multidetector CT imaging of the abdomen and pelvis was performed following the standard protocol without IV contrast. RADIATION DOSE REDUCTION: This exam was performed according to the departmental dose-optimization program which includes automated exposure control, adjustment of the mA and/or kV according to patient size and/or use of iterative reconstruction technique. COMPARISON:  PET-CT 03/05/2023 FINDINGS: Lower chest: Thickened paraspinal tissue in the posterior mediastinum (image 8/2). Findings consistent lymphoma. Hepatobiliary: No focal hepatic lesion. Normal gallbladder. No biliary duct dilatation. Common bile duct is normal. Pancreas: Pancreas is normal. No ductal dilatation. No pancreatic inflammation. Spleen: Spleen is mildly enlarged at 14. Cm in craniocaudad dimension Adrenals/urinary tract: Adrenal glands and kidneys are normal. The ureters and bladder normal. Stomach/Bowel: Stomach, small bowel, appendix, and cecum are normal. The colon and rectosigmoid colon are normal. Vascular/Lymphatic: Bulky retroperitoneal lymphadenopathy increased from comparison exam. Retroperitoneal nodal mass at the level of the lower pole LEFT kidney LEFT aorta measures 7.4 cm (image 46/2) compared to 5.1 cm comparison PET-CT scan. The aorta is elevated by 19 mm by retroperitoneal adenopathy compared to 8 mm on prior. Example node ventral to the aorta measuring 33 mm on image 52 compares to 23 mm. Bulky adenopathy extends along the iliac chains. LEFT external iliac lymph node anterior to the operator space measuring 22 mm compared to 13 mm. Minimal inguinal adenopathy. Reproductive: Prostate unremarkable Other: No free fluid. Musculoskeletal: No aggressive osseous lesion. Posterior fusion of the L4 spinous processes IMPRESSION: 1. Progression of lymphoma lymphadenopathy with increase in bulky retroperitoneal nodal mass. 2. Increase in perispinal thickening in the posterior mediastinum. 3. Mild splenomegaly. 4. No bowel  obstruction. Electronically Signed   By: Jackquline Boxer M.D.   On: 08/06/2023 18:20    Labs:  CBC: Recent Labs    06/08/23 0805 07/06/23 0832 08/06/23 1513 08/07/23 0043  WBC 4.0 4.8 2.9* 2.1*  HGB 8.9* 8.5* 9.0* 8.9*  HCT 26.4* 26.2* 28.6* 28.9*  PLT 117* 100* 38* 30*    COAGS: No results for input(s): INR, APTT in the last 8760 hours.  BMP: Recent Labs    06/08/23 0805 07/06/23 0832 08/06/23 1513 08/07/23 0043  NA 138 141 134* 135  K 4.0 4.0 4.4 4.4  CL 103 105 101 105  CO2 26 26 22  21*  GLUCOSE 86 51* 111* 120*  BUN 14 17 45* 40*  CALCIUM 8.7* 8.9 9.6 9.2  CREATININE 1.02 0.98 2.56* 2.22*  GFRNONAA >60 >60 27* 32*    LIVER FUNCTION TESTS: Recent Labs    06/08/23 0805 07/06/23 0832 08/06/23 1513 08/07/23 0043  BILITOT 1.2 1.5* 1.1 0.9  AST 13* 16 25 20   ALT 5 5 12 10   ALKPHOS 75 84 73 71  PROT 6.5 6.7 6.9 6.6  ALBUMIN 4.1 4.2 4.2 4.0     Assessment and Plan: Hx of CLL Progressive adenopathy Imaging reviewed by Dr. Hughes, amenable to CT guided RP LN bx. Labs reviewed, will check INR Risks and benefits of retroperitoneal LN bx was discussed with the patient and/or patient's family including, but not limited to bleeding, infection, damage to adjacent structures or low yield requiring additional tests.  All of the questions were answered and there is agreement to proceed. Pt would like Dr. Onesimo informed as soon as possible Monday to make sure he is on board with plan.  Consent signed and in chart.    Electronically Signed: Franky Rusk, PA-C 08/07/2023, 11:40 AM   I spent a total of 20 minutes in face to face in clinical consultation, greater than 50% of which was counseling/coordinating care for LN biopsy

## 2023-08-07 NOTE — Plan of Care (Signed)

## 2023-08-07 NOTE — Progress Notes (Signed)
 PROGRESS NOTE  Tyler Scott  FMW:[id number] DOB: August 19, 1955 DOA: 08/06/2023 PCP: [name], MD   Brief Narrative: Patient is a 68 year old male with history of insulin -dependent type type II, hypertension, CLL who presented with a complaint of progressive low back pain, not able to walk, decreased oral intake.  Lab work on presentation showed pancytopenia.  CT abdomen/pelvis showed  Progression of lymphoma lymphadenopathy with increase in bulky retroperitoneal nodal mass.  MRI of the lumbar spine showed Masslike extraosseous paraspinal signal abnormality at multiple Levels.  Oncology consulted.  Plan for possible lymph node biopsy, Port-A-Cath placement for starting of chemotherapy.  Assessment & Plan:  Principal Problem:   AKI (acute kidney injury) (HCC) Active Problems:   CLL (chronic lymphocytic leukemia) (HCC)   Insulin  dependent type 2 diabetes mellitus (HCC)   Pancytopenia (HCC)  CLL/pancytopenia: Presented with low back pain.CT abdomen/pelvis showed  progression of lymphoma lymphadenopathy with increase in bulky retroperitoneal nodal mass.  MRI of the lumbar spine showed masslike extraosseous paraspinal signal abnormality at multiple levels.  History of CLL, was on different therapies.  Has adenopathy in the inguinal area.  Oncology suspecting transformation of CLL to aggressive lymphoma.  Planning for biopsy of one of the lymph nodes.  Started on resbirucase  for high uric acid.  Suspicion of bone marrow involvement.  Oncology planning for starting chemotherapy, Port-A-Cath placement.  Hyponatremia: Likely from dehydration.  Started on gentle IV fluid  AKI: No obstructive pathology as per CT abdomen/pelvis.  Continue hydration.  UA unremarkable.  Improving  Insulin -dependent diabetes type 2: A1c of 5.8  Continue current insulin  regimen  Failure to thrive/generalized weakness: PT consulted         DVT prophylaxis:Heparin  [place]     Code Status: Full  Code  Family Communication:   Patient status:Inoatient  Patient is from :home  Anticipated discharge un:ynfz  Estimated DC date: After full workup, may take2 to 3 days   Consultants: Oncology  Procedures: None  Antimicrobials:  Anti-infectives (From admission, onward)    Start     Dose/Rate Route Frequency Ordered Stop   08/06/23 2145  acyclovir  (ZOVIRAX ) tablet 400 mg        400 mg Oral Daily 08/06/23 2133         Subjective: Patient seen and examined at bedside today.  Hemodynamically stable.  Lying in bed.  Appears weak but overall comfortable.  Alert and oriented.  Complains of some low back pain but not in distress.  Lives with wife  Objective: Vitals:   08/06/23 1730 08/06/23 1947 08/06/23 2311 08/07/23 0325  BP: (!) 115/59 (!) 141/68 (!) 144/72 136/70  Pulse: 85 88 99 99  Resp: (!) 21 18 20 18   Temp:  98.1 F (36.7 C) 98 F (36.7 C) 98.6 F (37 C)  TempSrc:  Oral Oral Oral  SpO2: 100% 100% 100% 98%  Weight:      Height:        Intake/Output Summary (Last 24 hours) at 08/07/2023 0825 Last data filed at 08/07/2023 0700 Gross per 24 hour  Intake 1000 ml  Output 275 ml  Net 725 ml   Filed Weights   08/06/23 1506  Weight: 65.8 kg    Examination:  General exam: Overall comfortable, not in distress HEENT: PERRL Respiratory system:  no wheezes or crackles  Cardiovascular system: S1 & S2 heard, RRR.  Gastrointestinal system: Abdomen is nondistended, soft and nontender. Central nervous system: Alert and oriented Extremities: No edema, no clubbing ,no cyanosis, bilateral inguinal  lymph nodes enlargement Skin: No rashes, no ulcers,no icterus     Data Reviewed: I have personally reviewed following labs and imaging studies  CBC: Recent Labs  Lab 08/06/23 1513 08/07/23 0043  WBC 2.9* 2.1*  NEUTROABS  --  0.7*  HGB 9.0* 8.9*  HCT 28.6* 28.9*  MCV 100.4* 102.8*  PLT 38* 30*   Basic Metabolic Panel: Recent Labs  Lab 08/06/23 1513  08/07/23 0043  NA 134* 135  K 4.4 4.4  CL 101 105  CO2 22 21*  GLUCOSE 111* 120*  BUN 45* 40*  CREATININE 2.56* 2.22*  CALCIUM 9.6 9.2     Recent Results (from the past 240 hours)  Culture, blood (Routine X 2) w Reflex to ID Panel     Status: None (Preliminary result)   Collection Time: 08/07/23 12:44 AM   Specimen: BLOOD RIGHT ARM  Result Value Ref Range Status   Specimen Description   Final    BLOOD RIGHT ARM Performed at [facility], [address], [place], [place] [address]    Special Requests   Final    BOTTLES DRAWN AEROBIC AND ANAEROBIC Blood Culture adequate volume Performed at [facility], [address] [place], [place] [address]    Culture PENDING  Incomplete   Report Status PENDING  Incomplete  Culture, blood (Routine X 2) w Reflex to ID Panel     Status: None (Preliminary result)   Collection Time: 08/07/23 12:44 AM   Specimen: BLOOD LEFT ARM  Result Value Ref Range Status   Specimen Description   Final    BLOOD LEFT ARM Performed at [facility], [address] [place], [place] [address]    Special Requests   Final    BOTTLES DRAWN AEROBIC AND ANAEROBIC Blood Culture adequate volume Performed at [facility], [address], [place], [place] [address]    Culture PENDING  Incomplete   Report Status PENDING  Incomplete     Radiology Studies: MR LUMBAR SPINE WO CONTRAST Result Date: 08/06/2023 CLINICAL DATA:  Myelopathy, acute, lumbar spine. EXAM: MRI LUMBAR SPINE WITHOUT CONTRAST TECHNIQUE: Multiplanar, multisequence MR imaging of the lumbar spine was performed. No intravenous contrast was administered. COMPARISON:  None Available. FINDINGS: Segmentation:  Standard. Alignment:  No substantial sagittal subluxation. Vertebrae: Diffusely low T1 signal throughout the bone marrow with heterogeneous signal in the sacral vertebral bodies. No evidence of acute fracture or discitis/osteomyelitis. Conus medullaris  and cauda equina: Conus extends to the L1-L2 level. Conus and cauda equina appear normal. Paraspinal and other soft tissues: There appears to be intermediate extraosseous signal surrounding the vertebral bodies at multiple levels (for example see series 9, image 28 and series 9, image 20), possibly extraosseous extension of tumor but not well assessed without contrast. Please see same day CT of the abdomen/pelvis for description of retroperitoneal nodal mass and other intra-abdominal intrapelvic findings. Disc levels: T12-L1: No significant disc protrusion, foraminal stenosis, or canal stenosis. L1-L2: No significant disc protrusion, foraminal stenosis, or canal stenosis. L2-L3: No significant disc protrusion, foraminal stenosis, or canal stenosis. L3-L4: No significant disc protrusion, foraminal stenosis, or canal stenosis. L4-L5: Mild disc bulge and endplate spurring without significant stenosis. L5-S1: Question subtle intermediate signal along the posterior aspect of the L5 vertebral body which partially effaces ventral CSF. Mild disc bulging at the disc level without significant canal or foraminal stenosis. Sacral canal: Question mild intermediate signal in the ventral canal without compressive stenosis. IMPRESSION: 1. Diffusely abnormal  marrow signal, likely related to the patient's underlying lymphoma. 2. Masslike extraosseous paraspinal signal abnormality at multiple levels, and L4 and subtle, equivacal signal in the ventral canal in the lower lumbar canal and sacral canal could potentially represent extraosseous extension of tumor but is incompletely assessed without contrast. If the patient is able, consider postcontrast imaging. 3. No compressive canal or foraminal stenosis. Electronically Signed   By: Gilmore GORMAN Molt M.D.   On: 08/06/2023 19:37   CT ABDOMEN PELVIS WO CONTRAST Result Date: 08/06/2023 CLINICAL DATA:  Chronic lymphocytic leukemia. Acute abdominal pain. * Tracking Code: BO * EXAM: CT  ABDOMEN AND PELVIS WITHOUT CONTRAST TECHNIQUE: Multidetector CT imaging of the abdomen and pelvis was performed following the standard protocol without IV contrast. RADIATION DOSE REDUCTION: This exam was performed according to the departmental dose-optimization program which includes automated exposure control, adjustment of the mA and/or kV according to patient size and/or use of iterative reconstruction technique. COMPARISON:  PET-CT 03/05/2023 FINDINGS: Lower chest: Thickened paraspinal tissue in the posterior mediastinum (image 8/2). Findings consistent lymphoma. Hepatobiliary: No focal hepatic lesion. Normal gallbladder. No biliary duct dilatation. Common bile duct is normal. Pancreas: Pancreas is normal. No ductal dilatation. No pancreatic inflammation. Spleen: Spleen is mildly enlarged at 14. Cm in craniocaudad dimension Adrenals/urinary tract: Adrenal glands and kidneys are normal. The ureters and bladder normal. Stomach/Bowel: Stomach, small bowel, appendix, and cecum are normal. The colon and rectosigmoid colon are normal. Vascular/Lymphatic: Bulky retroperitoneal lymphadenopathy increased from comparison exam. Retroperitoneal nodal mass at the level of the lower pole LEFT kidney LEFT aorta measures 7.4 cm (image 46/2) compared to 5.1 cm comparison PET-CT scan. The aorta is elevated by 19 mm by retroperitoneal adenopathy compared to 8 mm on prior. Example node ventral to the aorta measuring 33 mm on image 52 compares to 23 mm. Bulky adenopathy extends along the iliac chains. LEFT external iliac lymph node anterior to the operator space measuring 22 mm compared to 13 mm. Minimal inguinal adenopathy. Reproductive: Prostate unremarkable Other: No free fluid. Musculoskeletal: No aggressive osseous lesion. Posterior fusion of the L4 spinous processes IMPRESSION: 1. Progression of lymphoma lymphadenopathy with increase in bulky retroperitoneal nodal mass. 2. Increase in perispinal thickening in the posterior  mediastinum. 3. Mild splenomegaly. 4. No bowel obstruction. Electronically Signed   By: Jackquline Boxer M.D.   On: 08/06/2023 18:20    Scheduled Meds:  acyclovir   400 mg Oral Daily   allopurinol   150 mg Oral Daily   finasteride  1 mg Oral Daily   gabapentin  100 mg Oral QHS   insulin  aspart  0-9 Units Subcutaneous TID WC   insulin  glargine-yfgn  8 Units Subcutaneous Daily   lidocaine  1 patch Transdermal Q24H   simvastatin   20 mg Oral QHS   Continuous Infusions:  sodium chloride  75 mL/hr at 08/06/23 2246   rasburicase (ELITEK) 6 mg in sodium chloride  0.9 % 46 mL IVPB       LOS: 1 day   Ivonne Mustache, MD [facility] P6/28/2025, 8:25 AM

## 2023-08-07 NOTE — Evaluation (Signed)
 Physical Therapy Evaluation Patient Details Name: Tyler Scott MRN: [id number] DOB: 22-Jan-1956 Today's Date: 08/07/2023  History of Present Illness  68 year old male who presented with a complaint of progressive low back pain, not able to walk, decreased oral intake.  Lab work on presentation showed pancytopenia.  CT abdomen/pelvis showed progression of lymphoma lymphadenopathy with increase in bulky retroperitoneal nodal mass.  MRI of the lumbar spine showed Masslike extraosseous paraspinal signal abnormality at multiple  Levels.  Oncology consulted.  Plan for possible lymph node biopsy, Port-A-Cath placement for starting of chemotherapy.  PMHx:  insulin -dependent type type II, hypertension, CLL  Clinical Impression  Pt admitted with above diagnosis.  Pt currently with functional limitations due to the deficits listed below (see PT Problem List). Pt will benefit from acute skilled PT to increase their independence and safety with mobility to allow discharge.  Pt reports being independent up until about a week ago.  Pt has been unable to ambulate or perform steps and sleeping on the main level couch just prior to admission.  Pt lives with spouse and she will not likely be able to physically assist upon d/c.  Pt appears agreeable to post acute rehab upon d/c.  Patient will benefit from intensive inpatient follow-up therapy, >3 hours/day if possible.          If plan is discharge home, recommend the following: A lot of help with walking and/or transfers;A lot of help with bathing/dressing/bathroom;Assistance with cooking/housework;Assist for transportation;Help with stairs or ramp for entrance   Can travel by private vehicle        Equipment Recommendations Rolling walker (2 wheels)  Recommendations for Other Services       Functional Status Assessment Patient has had a recent decline in their functional status and demonstrates the ability to make significant improvements in function in  a reasonable and predictable amount of time.     Precautions / Restrictions Precautions Precautions: Fall      Mobility  Bed Mobility Overal bed mobility: Needs Assistance Bed Mobility: Rolling, Sidelying to Sit Rolling: Mod assist, Used rails, +2 for physical assistance Sidelying to sit: Mod assist, HOB elevated, Used rails, +2 for physical assistance       General bed mobility comments: verbal cues for log roll technique; increased assist for lower body due to pain in hips reported and LE weakness    Transfers Overall transfer level: Needs assistance Equipment used: Rolling walker (2 wheels) Transfers: Sit to/from Stand Sit to Stand: Mod assist, +2 safety/equipment           General transfer comment: reliant on UE assist; required boost to rise and stabilize with transition of hands to walker    Ambulation/Gait Ambulation/Gait assistance: Min assist, +2 safety/equipment Gait Distance (Feet): 20 Feet Assistive device: Rolling walker (2 wheels) Gait Pattern/deviations: Step-to pattern, Decreased stride length, Narrow base of support Gait velocity: decr     General Gait Details: increased reliance on UE support through RW; assist for weakness and stability (+2 for safety today); pt reports pain with movement in hips however feeling good to move around  [employer] Mobility     Tilt Bed    Modified Rankin (Stroke Patients Only)       Balance Overall balance assessment: Needs assistance Sitting-balance support: No upper extremity supported, Feet supported Sitting balance-Leahy Scale: Good     Standing balance support: Bilateral upper extremity supported, Reliant on assistive device for balance  Standing balance-Leahy Scale: Poor                               Pertinent Vitals/Pain Pain Assessment Pain Assessment: Faces Faces Pain Scale: Hurts even more Pain Location: bil hips Pain Descriptors / Indicators: Sore,  Grimacing, Guarding, Shooting Pain Intervention(s): Repositioned, Monitored during session, Patient requesting pain meds-RN notified    Home Living Family/patient expects to be discharged to:: Private residence Living Arrangements: Spouse/significant other   Type of Home: House Home Access: Stairs to enter Entrance Stairs-Rails: Right Entrance Stairs-Number of Steps: 2 or 4 Alternate Level Stairs-Number of Steps: 14 Home Layout: Two level Home Equipment: Cane - single point;Crutches Additional Comments: pt has been sleeping on main level couch    Prior Function Prior Level of Function : Independent/Modified Independent             Mobility Comments: independent until about a week ago, report progressing decline in mobility, unable to ambulate or perform stairs just prior to admission       Extremity/Trunk Assessment        Lower Extremity Assessment Lower Extremity Assessment: Generalized weakness;LLE deficits/detail (at best 2+/5 throughout; pt unable to perform full hip and knee AROM against gravity; able to perform ankle pumps) LLE Deficits / Details: L LE weaker then R LE    Cervical / Trunk Assessment Cervical / Trunk Assessment: Normal  Communication   Communication Communication: No apparent difficulties    Cognition Arousal: Alert Behavior During Therapy: WFL for tasks assessed/performed   PT - Cognitive impairments: No apparent impairments                         Following commands: Intact       Cueing       General Comments      Exercises     Assessment/Plan    PT Assessment Patient needs continued PT services  PT Problem List Decreased strength;Decreased activity tolerance;Decreased balance;Decreased mobility;Decreased knowledge of use of DME;Pain       PT Treatment Interventions DME instruction;Gait training;Balance training;Functional mobility training;Therapeutic activities;Therapeutic exercise;Patient/family education;Stair  training    PT Goals (Current goals can be found in the Care Plan section)  Acute Rehab PT Goals PT Goal Formulation: With patient/family Time For Goal Achievement: 08/21/23 Potential to Achieve Goals: Good    Frequency Min 3X/week     Co-evaluation               AM-PAC PT 6 Clicks Mobility  Outcome Measure Help needed turning from your back to your side while in a flat bed without using bedrails?: A Lot Help needed moving from lying on your back to sitting on the side of a flat bed without using bedrails?: A Lot Help needed moving to and from a bed to a chair (including a wheelchair)?: A Lot Help needed standing up from a chair using your arms (e.g., wheelchair or bedside chair)?: A Lot Help needed to walk in hospital room?: A Lot Help needed climbing 3-5 steps with a railing? : Total 6 Click Score: 11    End of Session Equipment Utilized During Treatment: Gait belt Activity Tolerance: Patient tolerated treatment well Patient left: in chair;with call bell/phone within reach;with chair alarm set;with family/visitor present Nurse Communication: Mobility status PT Visit Diagnosis: Difficulty in walking, not elsewhere classified (R26.2);Unsteadiness on feet (R26.81);Muscle weakness (generalized) (M62.81)    Time: 8458-8398 PT Time  Calculation (min) (ACUTE ONLY): 20 min   Charges:   PT Evaluation $PT Eval Moderate Complexity: 1 Mod   PT General Charges $$ ACUTE PT VISIT: 1 Visit       Tari PT, DPT Physical Therapist Acute Rehabilitation Services Office: [phone]   Tari CROME Payson 08/07/2023, 4:23 PM

## 2023-08-07 NOTE — Consult Note (Signed)
 Tyler Scott is a very nice 68 year old African-American male.  He is a true [employer] hero.  He served in [employer] for 10 years.  He is originally from [place].  He has a longstanding history of CLL.  He has been on different therapies.  He currently is on Calquence  and I think also Gazyva .  He has not had any Gazyva  for think couple wants.  He says he still taking the Calquence .  He has had some problems with UTI.  He has seen urology.  He has had difficulty over the past several days.  He has gotten weaker.  He says he is have a lot of pain in his legs.  He said he was not able to walk.  He did have some hematuria.  Again Urology is following this.  He was admitted on 08/06/2023.  At that time, his CBC showed white cell count of 2.9.  Hemoglobin 9.  Platelet count 38,000.  MCV is 100.  Today, his white cell count is 2.1.  Hemoglobin 8.9.  Platelet count 30,000.  His white cell differential shows 34 segs 53 lymphs 9 monocytes.  His electrolytes when he came in showed a sodium 134.  Potassium 4.4.  BUN 45 creatinine 2.56.  Calcium 9.6 with an albumin of 4.2.  Today, his BUN is 40 creatinine 2.22.  LDH is 203.  He has had cultures taken.  Urinalysis was relatively unremarkable.  He had a CT scan done.  This showed progressive adenopathy with bulky retroperitoneal nodal mass.  He had paraspinal thickening in the posterior mediastinum.  He had mild splenomegaly.  He had an MRI of the lumbar spine.  He had diffuse abnormal marrow signal.  He had masslike extraosseous signal at multiple levels.  There is no obvious cord compression.  He notes adenopathy in the inguinal area.  He is able to move his legs.  He has pain.  He has had sweats.  He says he had no obvious fever.  There is no cough.  He has lost weight.  His appetite is marginal.    His vital signs show temperature of 98.6.  Pulse 99.  Blood pressure 136/70.  His head neck exam shows no scleral icterus.  There is no oral  lesions.  I cannot palpate any obvious adenopathy.  His lungs are clear bilaterally.  Cardiac exam tachycardic but regular.  He has no murmurs.  Axillary exam shows some slight fullness in the axilla bilaterally.  Abdominal exam is soft.  Bowel sounds are present.  He has no fluid wave.  There is no palpable liver or spleen tip.  He does have bilateral inguinal adenopathy.  Extremities shows no clubbing, cyanosis or edema.  Neurological exam shows no focal neurological deficits.  Skin exam shows no rashes, petechia or ecchymoses.    I have to worry that Tyler Scott is transforming his CLL to a more aggressive lymphoma.  He has had CLL for quite a while.  As such, he certainly would be at high risk for transformation.  He definitely is going need to have a biopsy of one of the lymph nodes.  His uric acid is on the high side.  I probably give him a dose of rasburicase for this.  I am not sure why has renal insufficiency.  I do not see anything on his scans that looks like he has obstruction.  Again I do believe that his bone marrow is clearly involved.  I suspect that is why he  has a pancytopenia.  He is very nice.  I think he probably will need to have an echocardiogram.  I suspect he is probably going need chemotherapy.  He probably will need to have a Port-A-Cath placed for chemotherapy.  Again, we will have to follow him along closely.  I would hold on transfusions for right now.  He needs to have his blood checked daily.  I know he will get incredible care for everybody upon 5 W.   Jeralyn Crease, MD  [place] 1:5

## 2023-08-08 DIAGNOSIS — N179 Acute kidney failure, unspecified: Secondary | ICD-10-CM | POA: Diagnosis not present

## 2023-08-08 LAB — GLUCOSE, CAPILLARY
Glucose-Capillary: 83 mg/dL (ref 70–99)
Glucose-Capillary: 274 mg/dL — ABNORMAL HIGH (ref 70–99)
Glucose-Capillary: 204 mg/dL — ABNORMAL HIGH (ref 70–99)
Glucose-Capillary: 185 mg/dL — ABNORMAL HIGH (ref 70–99)

## 2023-08-08 LAB — CBC WITH DIFFERENTIAL/PLATELET
Abs Immature Granulocytes: 0.03 10*3/uL (ref 0.00–0.07)
Basophils Absolute: 0 10*3/uL (ref 0.0–0.1)
Basophils Relative: 1 %
Eosinophils Absolute: 0 10*3/uL (ref 0.0–0.5)
Eosinophils Relative: 0 %
HCT: 28 % — ABNORMAL LOW (ref 39.0–52.0)
Hemoglobin: 8.7 g/dL — ABNORMAL LOW (ref 13.0–17.0)
Immature Granulocytes: 1 %
Lymphocytes Relative: 55 %
Lymphs Abs: 1.4 10*3/uL (ref 0.7–4.0)
MCH: 32.1 pg (ref 26.0–34.0)
MCHC: 31.1 g/dL (ref 30.0–36.0)
MCV: 103.3 fL — ABNORMAL HIGH (ref 80.0–100.0)
Monocytes Absolute: 0.3 10*3/uL (ref 0.1–1.0)
Monocytes Relative: 13 %
Neutro Abs: 0.8 10*3/uL — ABNORMAL LOW (ref 1.7–7.7)
Neutrophils Relative %: 30 %
Platelets: 30 10*3/uL — ABNORMAL LOW (ref 150–400)
RBC: 2.71 MIL/uL — ABNORMAL LOW (ref 4.22–5.81)
RDW: 16.8 % — ABNORMAL HIGH (ref 11.5–15.5)
WBC: 2.5 10*3/uL — ABNORMAL LOW (ref 4.0–10.5)
nRBC: 0.8 % — ABNORMAL HIGH (ref 0.0–0.2)

## 2023-08-08 LAB — COMPREHENSIVE METABOLIC PANEL WITH GFR
ALT: 8 U/L (ref 0–44)
AST: 16 U/L (ref 15–41)
Albumin: 3.4 g/dL — ABNORMAL LOW (ref 3.5–5.0)
Alkaline Phosphatase: 65 U/L (ref 38–126)
Anion gap: 9 (ref 5–15)
BUN: 32 mg/dL — ABNORMAL HIGH (ref 8–23)
CO2: 21 mmol/L — ABNORMAL LOW (ref 22–32)
Calcium: 9.1 mg/dL (ref 8.9–10.3)
Chloride: 106 mmol/L (ref 98–111)
Creatinine, Ser: 1.42 mg/dL — ABNORMAL HIGH (ref 0.61–1.24)
GFR, Estimated: 54 mL/min — ABNORMAL LOW (ref >60)
Glucose, Bld: 104 mg/dL — ABNORMAL HIGH (ref 70–99)
Potassium: 4.2 mmol/L (ref 3.5–5.1)
Sodium: 136 mmol/L (ref 135–145)
Total Bilirubin: 1.1 mg/dL (ref 0.0–1.2)
Total Protein: 6.1 g/dL — ABNORMAL LOW (ref 6.5–8.1)

## 2023-08-08 LAB — PROTIME-INR
INR: 1 (ref 0.8–1.2)
Prothrombin Time: 14 s (ref 11.4–15.2)

## 2023-08-08 MED ORDER — SENNOSIDES-DOCUSATE SODIUM 8.6-50 MG PO TABS
1.0000 | ORAL_TABLET | Freq: Two times a day (BID) | ORAL | Status: DC
  Administered 2023-08-08 – 2023-08-18 (×17): 1 via ORAL
  Filled 2023-08-08 (×19): qty 1

## 2023-08-08 MED ORDER — POLYETHYLENE GLYCOL 3350 17 G PO PACK
17.0000 g | PACK | Freq: Every day | ORAL | Status: DC
  Administered 2023-08-08 – 2023-08-18 (×8): 17 g via ORAL
  Filled 2023-08-08 (×11): qty 1

## 2023-08-08 NOTE — Evaluation (Signed)
 Occupational Therapy Evaluation Patient Details Name: Tyler Scott MRN: [id number] DOB: Jun 24, 1955 Today's Date: 08/08/2023   History of Present Illness   68 year old male who presented with a complaint of progressive low back pain, not able to walk, decreased oral intake.  Lab work on presentation showed pancytopenia.  CT abdomen/pelvis showed progression of lymphoma lymphadenopathy with increase in bulky retroperitoneal nodal mass.  MRI of the lumbar spine showed Masslike extraosseous paraspinal signal abnormality at multiple  Levels.  Oncology consulted.  Plan for possible lymph node biopsy, Port-A-Cath placement for starting of chemotherapy.  PMHx:  insulin -dependent type type II, hypertension, CLL     Clinical Impressions PTA. Patient was ling at home at [facility] I level with wife with decline prior to admission for accessing 2nd floor of home. Currently, patient presents with deficits outlined below (see OT Problem list for details) most significantly, pain, generalized muscle weakness, mild higher level cog/processing skills, balance and activity tolerance deficits impacting ADL's and functional mobility. Patient requires continued Acute level OT services to progress function and safety. Patient will benefit from intensive inpatient follow-up therapy, >3 hours/day.       If plan is discharge home, recommend the following:   Two people to help with walking and/or transfers;A lot of help with bathing/dressing/bathroom;Assistance with cooking/housework;Direct supervision/assist for medications management;Direct supervision/assist for financial management;Assist for transportation;Help with stairs or ramp for entrance;Supervision due to cognitive status     Functional Status Assessment   Patient has had a recent decline in their functional status and demonstrates the ability to make significant improvements in function in a reasonable and predictable amount of time.     Equipment  Recommendations   None recommended by OT (TBA post rehab venue)      Precautions/Restrictions   Precautions Precautions: Fall Restrictions Weight Bearing Restrictions Per Provider Order: No     Mobility Bed Mobility Overal bed mobility: Needs Assistance Bed Mobility: Rolling, Sidelying to Sit Rolling: Used rails, +2 for physical assistance, Min assist Sidelying to sit: HOB elevated, Used rails, +2 for physical assistance, Min assist       General bed mobility comments: verbal cues for log roll technique; increased assist for lower body due to LE weakness    Transfers Overall transfer level: Needs assistance Equipment used: Rolling walker (2 wheels) Transfers: Sit to/from Stand, Bed to chair/wheelchair/BSC Sit to Stand: Mod assist, +2 safety/equipment     Step pivot transfers: Mod assist, +2 physical assistance, +2 safety/equipment, From elevated surface     General transfer comment: reliant on UE assist; required boost to rise and stabilize with transition of hands to walker, 5 steps to recliner with RW      Balance Overall balance assessment: Needs assistance Sitting-balance support: No upper extremity supported, Feet supported Sitting balance-Leahy Scale: Good     Standing balance support: Bilateral upper extremity supported, Reliant on assistive device for balance Standing balance-Leahy Scale: Poor                             ADL either performed or assessed with clinical judgement   ADL Overall ADL's : Needs assistance/impaired Eating/Feeding: Independent   Grooming: Wash/dry hands;Wash/dry face;Oral care;Set up;Sitting   Upper Body Bathing: Set up;Sitting   Lower Body Bathing: Maximal assistance;Sit to/from stand   Upper Body Dressing : Set up;Sitting   Lower Body Dressing: Maximal assistance;Sit to/from stand   Toilet Transfer: +2 for physical assistance;+2 for safety/equipment;Moderate assistance;BSC/3in1;Rolling walker (2 wheels)  Toileting- Clothing Manipulation and Hygiene: Set up;Sitting/lateral lean Toileting - Clothing Manipulation Details (indicate cue type and reason): urinal use     Functional mobility during ADLs: Moderate assistance;+2 for physical assistance;+2 for safety/equipment;Rolling walker (2 wheels) General ADL Comments: decreased functional reach to LE's     Vision Baseline Vision/History: 0 No visual deficits Ability to See in Adequate Light: 0 Adequate Vision Assessment?: No apparent visual deficits            Pertinent Vitals/Pain Pain Assessment Pain Assessment: Faces Faces Pain Scale: Hurts little more Pain Location: thighs Pain Descriptors / Indicators: Sore, Grimacing, Guarding, Shooting Pain Intervention(s): Monitored during session, Repositioned, Premedicated before session     Extremity/Trunk Assessment Upper Extremity Assessment Upper Extremity Assessment: Right hand dominant;Generalized weakness   Lower Extremity Assessment LLE Deficits / Details: L LE weaker then R LE   Cervical / Trunk Assessment Cervical / Trunk Assessment: Normal   Communication Communication Communication: No apparent difficulties   Cognition Arousal: Alert Behavior During Therapy: WFL for tasks assessed/performed Cognition: Cognition impaired   Orientation impairments: Time Awareness: Online awareness impaired Memory impairment (select all impairments): Short-term memory Attention impairment (select first level of impairment): Selective attention Executive functioning impairment (select all impairments): Problem solving OT - Cognition Comments: mild higher level processing delays with combo of pain meds and mild decline                 Following commands: Intact       Cueing  General Comments   Cueing Techniques: Verbal cues  no skin issues or edema noted           Home Living Family/patient expects to be discharged to:: Private residence Living Arrangements:  Spouse/significant other Available Help at Discharge: Family Type of Home: House Home Access: Stairs to enter Secretary/administrator of Steps: 2 or 4 Entrance Stairs-Rails: Right Home Layout: Two level Alternate Level Stairs-Number of Steps: 14   Bathroom Shower/Tub: Chief Strategy Officer: Standard     Home Equipment: Cane - single point;Crutches   Additional Comments: pt has been sleeping on main level couch      Prior Functioning/Environment Prior Level of Function : Independent/Modified Independent             Mobility Comments: independent until about a week ago, report progressing decline in mobility, unable to ambulate or perform stairs just prior to admission      OT Problem List: Decreased strength;Decreased activity tolerance;Impaired balance (sitting and/or standing);Decreased cognition;Decreased safety awareness;Decreased knowledge of use of DME or AE;Decreased knowledge of precautions;Pain   OT Treatment/Interventions: Self-care/ADL training;Therapeutic exercise;Neuromuscular education;Energy conservation;DME and/or AE instruction;Therapeutic activities;Cognitive remediation/compensation;Patient/family education;Balance training      OT Goals(Current goals can be found in the care plan section)   Acute Rehab OT Goals Patient Stated Goal: to get stronger OT Goal Formulation: With patient Time For Goal Achievement: 08/22/23 Potential to Achieve Goals: Good ADL Goals Pt Will Perform Lower Body Bathing: with contact guard assist;with adaptive equipment;sit to/from stand Pt Will Perform Lower Body Dressing: with contact guard assist;with adaptive equipment;sit to/from stand Pt Will Transfer to Toilet: with contact guard assist;ambulating;bedside commode;grab bars Pt/caregiver will Perform Home Exercise Program: Increased strength;Both right and left upper extremity;Independently;With written HEP provided   OT Frequency:  Min 2X/week        AM-PAC OT 6 Clicks Daily Activity     Outcome Measure Help from another person eating meals?: None Help from another person taking care of personal grooming?:  A Little Help from another person toileting, which includes using toliet, bedpan, or urinal?: A Little Help from another person bathing (including washing, rinsing, drying)?: A Lot Help from another person to put on and taking off regular upper body clothing?: A Little Help from another person to put on and taking off regular lower body clothing?: A Lot 6 Click Score: 17   End of Session Equipment Utilized During Treatment: Gait belt;Rolling walker (2 wheels) Nurse Communication: Mobility status  Activity Tolerance: Patient tolerated treatment well Patient left: in chair;with call bell/phone within reach;with chair alarm set  OT Visit Diagnosis: Unsteadiness on feet (R26.81);Other abnormalities of gait and mobility (R26.89);Muscle weakness (generalized) (M62.81);Pain                Time: 8889-8874 OT Time Calculation (min): 15 min Charges:  OT General Charges $OT Visit: 1 Visit OT Evaluation $OT Eval Moderate Complexity: 1 Mod  Oshay Stranahan OT/L Acute Rehabilitation Department  [phone]  08/08/2023, 1:29 PM

## 2023-08-08 NOTE — Progress Notes (Signed)
 PROGRESS NOTE  Tyler Scott  FMW:[id number] DOB: 1955/03/07 DOA: 08/06/2023 PCP: Tyler Bruckner, MD   Brief Narrative: Patient is a 68 year old male with history of insulin -dependent type type II, hypertension, CLL who presented with a complaint of progressive low back pain, not able to walk, decreased oral intake.  Lab work on presentation showed pancytopenia.  CT abdomen/pelvis showed  Progression of lymphoma lymphadenopathy with increase in bulky retroperitoneal nodal mass.  MRI of the lumbar spine showed Masslike extraosseous paraspinal signal abnormality at multiple Levels.  Oncology consulted.  Plan for  lymph node biopsy, Port-A-Cath placement for starting of chemotherapy.  Assessment & Plan:  Principal Problem:   AKI (acute kidney injury) (HCC) Active Problems:   CLL (chronic lymphocytic leukemia) (HCC)   Insulin  dependent type 2 diabetes mellitus (HCC)   Pancytopenia (HCC)  CLL/pancytopenia: Presented with low back pain.CT abdomen/pelvis showed  progression of lymphoma lymphadenopathy with increase in bulky retroperitoneal nodal mass.  MRI of the lumbar spine showed masslike extraosseous paraspinal signal abnormality at multiple levels.  History of CLL, was on different therapies.  Has adenopathy in the inguinal area.  Oncology suspecting transformation of CLL to aggressive lymphoma.  Planning for biopsy of one of the lymph nodes.  Started on resbirucase  for high uric acid.  Suspicion of bone marrow involvement.  Oncology planning for starting chemotherapy, Port-A-Cath placement. IR planning for  LN biopsy  tomorrow  Hyponatremia: Likely from dehydration.  Started on gentle IV fluid  AKI: No obstructive pathology as per CT abdomen/pelvis.  Continue hydration.  UA unremarkable.  Improving significantly  Insulin -dependent diabetes type 2: A1c of 5.8  Continue current insulin  regimen  Failure to thrive/generalized weakness: PT consulted, recommend CIR  Constipation:  Continue bowel regimen         DVT prophylaxis:Place and maintain sequential compression device Start: 08/07/23 1058 Place and maintain sequential compression device Start: 08/07/23 1058Heparin [employer]     Code Status: Full Code  Family Communication: None at bedside  Patient status:Inpatient  Patient is from :home  Anticipated discharge un:ynfz  Estimated DC date: After full workup, may take2 to 3 days   Consultants: Oncology  Procedures: None  Antimicrobials:  Anti-infectives (From admission, onward)    Start     Dose/Rate Route Frequency Ordered Stop   08/06/23 2145  acyclovir  (ZOVIRAX ) tablet 400 mg        400 mg Oral Daily 08/06/23 2133         Subjective: Patient seen and examined at bedside today.  He looks comfortable this morning.  He was lying in bed.  He denies significant back pain or abdominal pain.  No bowel movement for last few days.  Speaking in full sentences.  No chest pain or shortness of breath.  Anticipating liver biopsy tomorrow.  Objective: Vitals:   08/07/23 0325 08/07/23 1403 08/07/23 2116 08/08/23 0448  BP: 136/70 131/75 (!) 153/69 (!) 146/64  Pulse: 99 96 (!) 105 99  Resp: 18 16 17 17   Temp: 98.6 F (37 C) 98.7 F (37.1 C) 98.9 F (37.2 C) 97.9 F (36.6 C)  TempSrc: Oral Oral    SpO2: 98% 99% 99% 99%  Weight:      Height:        Intake/Output Summary (Last 24 hours) at 08/08/2023 1142 Last data filed at 08/08/2023 0743 Gross per 24 hour  Intake 1177.26 ml  Output 1125 ml  Net 52.26 ml   Filed Weights   08/06/23 1506  Weight: 65.8 kg  Examination:   General exam: Overall comfortable, not in distress HEENT: PERRL Respiratory system:  no wheezes or crackles  Cardiovascular system: S1 & S2 heard, RRR.  Gastrointestinal system: Abdomen is nondistended, soft and nontender. Central nervous system: Alert and oriented Extremities: No edema, no clubbing ,no cyanosis, bilateral inguinal lymph node enlargement Skin: No rashes,  no ulcers,no icterus     Data Reviewed: I have personally reviewed following labs and imaging studies  CBC: Recent Labs  Lab 08/06/23 1513 08/07/23 0043 08/08/23 0506  WBC 2.9* 2.1* 2.5*  NEUTROABS  --  0.7* 0.8*  HGB 9.0* 8.9* 8.7*  HCT 28.6* 28.9* 28.0*  MCV 100.4* 102.8* 103.3*  PLT 38* 30* 30*   Basic Metabolic Panel: Recent Labs  Lab 08/06/23 1513 08/07/23 0043 08/08/23 0506  NA 134* 135 136  K 4.4 4.4 4.2  CL 101 105 106  CO2 22 21* 21*  GLUCOSE 111* 120* 104*  BUN 45* 40* 32*  CREATININE 2.56* 2.22* 1.42*  CALCIUM 9.6 9.2 9.1     Recent Results (from the past 240 hours)  Culture, blood (Routine X 2) w Reflex to ID Panel     Status: None (Preliminary result)   Collection Time: 08/07/23 12:44 AM   Specimen: BLOOD RIGHT ARM  Result Value Ref Range Status   Specimen Description   Final    BLOOD RIGHT ARM Performed at [facility], [address] [place], [place] [address]    Special Requests   Final    BOTTLES DRAWN AEROBIC AND ANAEROBIC Blood Culture adequate volume Performed at [facility], [address] [place], [place] [address]    Culture   Final    NO GROWTH 1 DAY Performed at [facility], [address], [place], [place] [address]    Report Status PENDING  Incomplete  Culture, blood (Routine X 2) w Reflex to ID Panel     Status: None (Preliminary result)   Collection Time: 08/07/23 12:44 AM   Specimen: BLOOD LEFT ARM  Result Value Ref Range Status   Specimen Description   Final    BLOOD LEFT ARM Performed at [facility], [address], [place], [place] [address]    Special Requests   Final    BOTTLES DRAWN AEROBIC AND ANAEROBIC Blood Culture adequate volume Performed at [facility], [address], [place], [place] [address]    Culture   Final    NO GROWTH 1 DAY Performed at [facility], [address] [place], [place] [address]    Report Status PENDING  Incomplete      Radiology Studies: ECHOCARDIOGRAM COMPLETE Result Date: 08/07/2023    ECHOCARDIOGRAM REPORT   Patient Name:   Tyler Scott Date of Exam: 08/07/2023 Medical Rec #:  [id number]           Height:       68.0 in Accession #:    [id number]          Weight:       145.0 lb Date of Birth:  [birth year]          BSA:          1.783 m Patient Age:    67 years            BP:           136/70 mmHg Patient Gender: M  HR:           100 bpm. Exam Location:  Inpatient Procedure: 2D Echo, Cardiac Doppler and Color Doppler (Both Spectral and Color            Flow Doppler were utilized during procedure). Indications:    Chemo  History:        Patient has no prior history of Echocardiogram examinations.                 Risk Factors:Diabetes.  Sonographer:    Benard Stallion Referring Phys: [id number] PETER R ENNEVER IMPRESSIONS  1. Left ventricular ejection fraction, by estimation, is 60 to 65%. The left ventricle has normal function. The left ventricle has no regional wall motion abnormalities. There is mild left ventricular hypertrophy. Left ventricular diastolic parameters are consistent with Grade I diastolic dysfunction (impaired relaxation).  2. Right ventricular systolic function is normal. The right ventricular size is normal.  3. The mitral valve is normal in structure. No evidence of mitral valve regurgitation. No evidence of mitral stenosis.  4. The tricuspid valve is abnormal. Tricuspid valve regurgitation is mild to moderate.  5. The aortic valve is tricuspid. Aortic valve regurgitation is not visualized. No aortic stenosis is present. FINDINGS  Left Ventricle: Left ventricular ejection fraction, by estimation, is 60 to 65%. The left ventricle has normal function. The left ventricle has no regional wall motion abnormalities. The left ventricular internal cavity size was normal in size. There is  mild left ventricular hypertrophy. Left ventricular diastolic parameters are consistent with Grade I  diastolic dysfunction (impaired relaxation). Normal left ventricular filling pressure. Right Ventricle: IVC not visualized, not able to estimate PASP. The right ventricular size is normal. Right vetricular wall thickness was not well visualized. Right ventricular systolic function is normal. Left Atrium: Left atrial size was normal in size. Right Atrium: Right atrial size was normal in size. Pericardium: There is no evidence of pericardial effusion. Mitral Valve: The mitral valve is normal in structure. There is mild thickening of the mitral valve leaflet(s). There is mild calcification of the mitral valve leaflet(s). Mild mitral annular calcification. No evidence of mitral valve regurgitation. No evidence of mitral valve stenosis. Tricuspid Valve: The tricuspid valve is abnormal. Tricuspid valve regurgitation is mild to moderate. No evidence of tricuspid stenosis. Aortic Valve: The aortic valve is tricuspid. Aortic valve regurgitation is not visualized. No aortic stenosis is present. Aortic valve mean gradient measures 4.0 mmHg. Aortic valve peak gradient measures 7.8 mmHg. Aortic valve area, by VTI measures 3.18 cm. Pulmonic Valve: The pulmonic valve was not well visualized. Pulmonic valve regurgitation is not visualized. No evidence of pulmonic stenosis. Aorta: The aortic root and ascending aorta are structurally normal, with no evidence of dilitation. IAS/Shunts: The interatrial septum was not well visualized.  LEFT VENTRICLE PLAX 2D LVIDd:         4.20 cm   Diastology LVIDs:         2.60 cm   LV e' medial:    7.29 cm/s LV PW:         1.10 cm   LV E/e' medial:  12.9 LV IVS:        1.10 cm   LV e' lateral:   7.51 cm/s LVOT diam:     2.20 cm   LV E/e' lateral: 12.5 LV SV:         74 LV SV Index:   41 LVOT Area:     3.80 cm  RIGHT VENTRICLE RV  Basal diam:  3.55 cm RV Mid diam:    3.30 cm RV S prime:     14.40 cm/s TAPSE (M-mode): 3.0 cm LEFT ATRIUM             Index        RIGHT ATRIUM           Index LA diam:         3.20 cm 1.80 cm/m   RA Area:     13.40 cm LA Vol (A2C):   52.1 ml 29.23 ml/m  RA Volume:   34.40 ml  19.30 ml/m LA Vol (A4C):   28.9 ml 16.21 ml/m LA Biplane Vol: 38.6 ml 21.65 ml/m  AORTIC VALVE AV Area (Vmax):    3.18 cm AV Area (Vmean):   3.22 cm AV Area (VTI):     3.18 cm AV Vmax:           140.00 cm/s AV Vmean:          93.700 cm/s AV VTI:            0.232 m AV Peak Grad:      7.8 mmHg AV Mean Grad:      4.0 mmHg LVOT Vmax:         117.00 cm/s LVOT Vmean:        79.300 cm/s LVOT VTI:          0.194 m LVOT/AV VTI ratio: 0.84  AORTA Ao Root diam: 3.40 cm Ao Asc diam:  3.00 cm MITRAL VALVE                TRICUSPID VALVE MV Area (PHT): 3.99 cm     TR Peak grad:   48.2 mmHg MV Decel Time: 190 msec     TR Vmax:        347.00 cm/s MV E velocity: 93.80 cm/s MV A velocity: 119.00 cm/s  SHUNTS MV E/A ratio:  0.79         Systemic VTI:  0.19 m                             Systemic Diam: 2.20 cm Dorn Ross MD Electronically signed by Dorn Ross MD Signature Date/Time: 08/07/2023/12:33:53 PM    Final    CT CHEST WO CONTRAST Result Date: 08/07/2023 CLINICAL DATA:  History of CLL.  Hematologic malignancy staging. EXAM: CT CHEST WITHOUT CONTRAST TECHNIQUE: Multidetector CT imaging of the chest was performed following the standard protocol without IV contrast. RADIATION DOSE REDUCTION: This exam was performed according to the departmental dose-optimization program which includes automated exposure control, adjustment of the mA and/or kV according to patient size and/or use of iterative reconstruction technique. COMPARISON:  CT abdomen and pelvis 08/06/2023.  PET-CT 03/05/2023 FINDINGS: Cardiovascular: Heart size is normal without significant pericardial fluid. Coronary artery calcifications. Atherosclerotic calcifications in thoracic aorta without aneurysm. Mediastinum/Nodes: Compared to the PET-CT from 03/05/2023, there has been enlargement lymph nodes throughout the chest. Nodal tissue in the left  supraclavicular area adjacent to the left thyroid  lobe measures 3.2 cm in the short axis on image 17/2 and previously measured 2.7 cm in the short axis. Prominent nodal tissue along the right side of the mediastinum on image 38/2 measures 3.2 cm in the short axis and previously measured 2.9 cm. Index left axillary lymph node on image 36/2 measures 2.6 cm in the short axis previously measured 2.0 cm. This new or increased soft tissue  along the posterior mediastinum and paraspinal region on image 88/2 that measures 2.2 cm in the short axis. There is also soft tissue along the right side of the posterior mediastinum and paraspinal region. Enlarged lymph nodes in the cardiophrenic fat. Enlarged right internal mammary lymph nodes. Lungs/Pleura: Trachea and mainstem bronchi are patent. Trace left pleural fluid with new pleural-based thickening particularly along the paraspinal regions. Volume loss in the posterior lower lobes particularly on the left side. Subtle ground-glass opacities along the anterior right lung adjacent to the right internal mammary chain are nonspecific but favor atelectasis. No suspicious pulmonary nodules. Upper Abdomen: Extensive lymphadenopathy in the upper abdomen. Spleen appears to be enlarged. Musculoskeletal: No acute bone abnormality. No suspicious osseous lesions. IMPRESSION: 1. Progression of the lymph node enlargement throughout the chest compared to the PET-CT from 03/05/2023. Diffuse lymphadenopathy throughout the chest as described. 2. Markedly increased nodal tissue and soft tissue thickening in the posterior mediastinum and paraspinal regions. 3. Volume loss in the posterior lower lobes particularly on the left side. Trace left pleural fluid. 4.  Aortic Atherosclerosis (ICD10-I70.0). Electronically Signed   By: Juliene Balder M.D.   On: 08/07/2023 09:28   MR LUMBAR SPINE WO CONTRAST Result Date: 08/06/2023 CLINICAL DATA:  Myelopathy, acute, lumbar spine. EXAM: MRI LUMBAR SPINE WITHOUT  CONTRAST TECHNIQUE: Multiplanar, multisequence MR imaging of the lumbar spine was performed. No intravenous contrast was administered. COMPARISON:  None Available. FINDINGS: Segmentation:  Standard. Alignment:  No substantial sagittal subluxation. Vertebrae: Diffusely low T1 signal throughout the bone marrow with heterogeneous signal in the sacral vertebral bodies. No evidence of acute fracture or discitis/osteomyelitis. Conus medullaris and cauda equina: Conus extends to the L1-L2 level. Conus and cauda equina appear normal. Paraspinal and other soft tissues: There appears to be intermediate extraosseous signal surrounding the vertebral bodies at multiple levels (for example see series 9, image 28 and series 9, image 20), possibly extraosseous extension of tumor but not well assessed without contrast. Please see same day CT of the abdomen/pelvis for description of retroperitoneal nodal mass and other intra-abdominal intrapelvic findings. Disc levels: T12-L1: No significant disc protrusion, foraminal stenosis, or canal stenosis. L1-L2: No significant disc protrusion, foraminal stenosis, or canal stenosis. L2-L3: No significant disc protrusion, foraminal stenosis, or canal stenosis. L3-L4: No significant disc protrusion, foraminal stenosis, or canal stenosis. L4-L5: Mild disc bulge and endplate spurring without significant stenosis. L5-S1: Question subtle intermediate signal along the posterior aspect of the L5 vertebral body which partially effaces ventral CSF. Mild disc bulging at the disc level without significant canal or foraminal stenosis. Sacral canal: Question mild intermediate signal in the ventral canal without compressive stenosis. IMPRESSION: 1. Diffusely abnormal marrow signal, likely related to the patient's underlying lymphoma. 2. Masslike extraosseous paraspinal signal abnormality at multiple levels, and L4 and subtle, equivacal signal in the ventral canal in the lower lumbar canal and sacral canal  could potentially represent extraosseous extension of tumor but is incompletely assessed without contrast. If the patient is able, consider postcontrast imaging. 3. No compressive canal or foraminal stenosis. Electronically Signed   By: Gilmore GORMAN Molt M.D.   On: 08/06/2023 19:37   CT ABDOMEN PELVIS WO CONTRAST Result Date: 08/06/2023 CLINICAL DATA:  Chronic lymphocytic leukemia. Acute abdominal pain. * Tracking Code: BO * EXAM: CT ABDOMEN AND PELVIS WITHOUT CONTRAST TECHNIQUE: Multidetector CT imaging of the abdomen and pelvis was performed following the standard protocol without IV contrast. RADIATION DOSE REDUCTION: This exam was performed according to the departmental dose-optimization program which  includes automated exposure control, adjustment of the mA and/or kV according to patient size and/or use of iterative reconstruction technique. COMPARISON:  PET-CT 03/05/2023 FINDINGS: Lower chest: Thickened paraspinal tissue in the posterior mediastinum (image 8/2). Findings consistent lymphoma. Hepatobiliary: No focal hepatic lesion. Normal gallbladder. No biliary duct dilatation. Common bile duct is normal. Pancreas: Pancreas is normal. No ductal dilatation. No pancreatic inflammation. Spleen: Spleen is mildly enlarged at 14. Cm in craniocaudad dimension Adrenals/urinary tract: Adrenal glands and kidneys are normal. The ureters and bladder normal. Stomach/Bowel: Stomach, small bowel, appendix, and cecum are normal. The colon and rectosigmoid colon are normal. Vascular/Lymphatic: Bulky retroperitoneal lymphadenopathy increased from comparison exam. Retroperitoneal nodal mass at the level of the lower pole LEFT kidney LEFT aorta measures 7.4 cm (image 46/2) compared to 5.1 cm comparison PET-CT scan. The aorta is elevated by 19 mm by retroperitoneal adenopathy compared to 8 mm on prior. Example node ventral to the aorta measuring 33 mm on image 52 compares to 23 mm. Bulky adenopathy extends along the iliac  chains. LEFT external iliac lymph node anterior to the operator space measuring 22 mm compared to 13 mm. Minimal inguinal adenopathy. Reproductive: Prostate unremarkable Other: No free fluid. Musculoskeletal: No aggressive osseous lesion. Posterior fusion of the L4 spinous processes IMPRESSION: 1. Progression of lymphoma lymphadenopathy with increase in bulky retroperitoneal nodal mass. 2. Increase in perispinal thickening in the posterior mediastinum. 3. Mild splenomegaly. 4. No bowel obstruction. Electronically Signed   By: Jackquline Boxer M.D.   On: 08/06/2023 18:20    Scheduled Meds:  acyclovir   400 mg Oral Daily   allopurinol   150 mg Oral Daily   feeding supplement  237 mL Oral BID BM   finasteride  1 mg Oral Daily   gabapentin  100 mg Oral QHS   insulin  aspart  0-9 Units Subcutaneous TID WC   insulin  glargine-yfgn  8 Units Subcutaneous Daily   lidocaine  1 patch Transdermal Q24H   simvastatin   20 mg Oral QHS   Continuous Infusions:     LOS: 2 days   Ivonne Mustache, MD [facility] P6/29/2025, 11:42 AM

## 2023-08-09 ENCOUNTER — Inpatient Hospital Stay (HOSPITAL_COMMUNITY)

## 2023-08-09 DIAGNOSIS — N179 Acute kidney failure, unspecified: Secondary | ICD-10-CM | POA: Diagnosis not present

## 2023-08-09 LAB — COMPREHENSIVE METABOLIC PANEL WITH GFR
ALT: 8 U/L (ref 0–44)
AST: 14 U/L — ABNORMAL LOW (ref 15–41)
Albumin: 3.2 g/dL — ABNORMAL LOW (ref 3.5–5.0)
Alkaline Phosphatase: 63 U/L (ref 38–126)
Anion gap: 8 (ref 5–15)
BUN: 28 mg/dL — ABNORMAL HIGH (ref 8–23)
CO2: 22 mmol/L (ref 22–32)
Calcium: 9.2 mg/dL (ref 8.9–10.3)
Chloride: 103 mmol/L (ref 98–111)
Creatinine, Ser: 1.03 mg/dL (ref 0.61–1.24)
GFR, Estimated: >60 mL/min
Glucose, Bld: 152 mg/dL — ABNORMAL HIGH (ref 70–99)
Potassium: 4.2 mmol/L (ref 3.5–5.1)
Sodium: 133 mmol/L — ABNORMAL LOW (ref 135–145)
Total Bilirubin: 1 mg/dL (ref 0.0–1.2)
Total Protein: 5.9 g/dL — ABNORMAL LOW (ref 6.5–8.1)

## 2023-08-09 LAB — CBC WITH DIFFERENTIAL/PLATELET
Abs Immature Granulocytes: 0.03 10*3/uL (ref 0.00–0.07)
Basophils Absolute: 0 10*3/uL (ref 0.0–0.1)
Basophils Relative: 0 %
Eosinophils Absolute: 0 10*3/uL (ref 0.0–0.5)
Eosinophils Relative: 0 %
HCT: 25.4 % — ABNORMAL LOW (ref 39.0–52.0)
Hemoglobin: 8 g/dL — ABNORMAL LOW (ref 13.0–17.0)
Immature Granulocytes: 1 %
Lymphocytes Relative: 55 %
Lymphs Abs: 1.5 10*3/uL (ref 0.7–4.0)
MCH: 31.6 pg (ref 26.0–34.0)
MCHC: 31.5 g/dL (ref 30.0–36.0)
MCV: 100.4 fL — ABNORMAL HIGH (ref 80.0–100.0)
Monocytes Absolute: 0.3 10*3/uL (ref 0.1–1.0)
Monocytes Relative: 12 %
Neutro Abs: 0.9 10*3/uL — ABNORMAL LOW (ref 1.7–7.7)
Neutrophils Relative %: 32 %
Platelets: 28 10*3/uL — CL (ref 150–400)
RBC: 2.53 MIL/uL — ABNORMAL LOW (ref 4.22–5.81)
RDW: 16.6 % — ABNORMAL HIGH (ref 11.5–15.5)
WBC: 2.7 10*3/uL — ABNORMAL LOW (ref 4.0–10.5)
nRBC: 0 % (ref 0.0–0.2)

## 2023-08-09 LAB — GLUCOSE, CAPILLARY
Glucose-Capillary: 142 mg/dL — ABNORMAL HIGH (ref 70–99)
Glucose-Capillary: 90 mg/dL (ref 70–99)
Glucose-Capillary: 157 mg/dL — ABNORMAL HIGH (ref 70–99)
Glucose-Capillary: 151 mg/dL — ABNORMAL HIGH (ref 70–99)

## 2023-08-09 MED ORDER — MIDAZOLAM HCL 2 MG/2ML IJ SOLN
INTRAMUSCULAR | Status: AC | PRN
  Administered 2023-08-09 (×2): 1 mg via INTRAVENOUS
  Administered 2023-08-09: 2 mg via INTRAVENOUS

## 2023-08-09 MED ORDER — FENTANYL CITRATE (PF) 100 MCG/2ML IJ SOLN
INTRAMUSCULAR | Status: AC
  Filled 2023-08-09: qty 2

## 2023-08-09 MED ORDER — MIDAZOLAM HCL 2 MG/2ML IJ SOLN
INTRAMUSCULAR | Status: AC
  Filled 2023-08-09: qty 4

## 2023-08-09 MED ORDER — FENTANYL CITRATE (PF) 100 MCG/2ML IJ SOLN
INTRAMUSCULAR | Status: AC | PRN
  Administered 2023-08-09 (×2): 50 ug via INTRAVENOUS

## 2023-08-09 MED ORDER — AMLODIPINE BESYLATE 10 MG PO TABS
10.0000 mg | ORAL_TABLET | Freq: Every day | ORAL | Status: DC
  Administered 2023-08-09 – 2023-08-18 (×10): 10 mg via ORAL
  Filled 2023-08-09 (×10): qty 1

## 2023-08-09 NOTE — Progress Notes (Signed)
 Physical Therapy Treatment Patient Details Name: Tyler Scott MRN: [id number] DOB: 1955-04-26 Today's Date: 08/09/2023   History of Present Illness 68 year old male who presented with a complaint of progressive low back pain, not able to walk, decreased oral intake.  Lab work on presentation showed pancytopenia.  CT abdomen/pelvis showed progression of lymphoma lymphadenopathy with increase in bulky retroperitoneal nodal mass.  MRI of the lumbar spine showed Masslike extraosseous paraspinal signal abnormality at multiple  Levels.  Oncology consulted.  Plan for possible lymph node biopsy, Port-A-Cath placement for starting of chemotherapy.  PMHx:  insulin -dependent type type II, hypertension, CLL    PT Comments  Pt supine in bed on arrival.  He reports feeling groggy from biopsy but willing to work with PTA this pm.  Pt required min-mod assistance for all functional mobility. He continues to be motivated to mobilize.  Plan remains appropriate for aggressive rehab in a post acute setting.      If plan is discharge home, recommend the following: A lot of help with walking and/or transfers;A lot of help with bathing/dressing/bathroom;Assistance with cooking/housework;Assist for transportation;Help with stairs or ramp for entrance   Can travel by private vehicle        Equipment Recommendations  Rolling walker (2 wheels)    Recommendations for Other Services       Precautions / Restrictions Precautions Precautions: Fall Restrictions Weight Bearing Restrictions Per Provider Order: No     Mobility  Bed Mobility Overal bed mobility: Needs Assistance   Rolling: Used rails, Min assist Sidelying to sit: HOB elevated, Used rails, Min assist       General bed mobility comments: verbal cues for log roll technique; increased assist for lower body due to LE weakness    Transfers Overall transfer level: Needs assistance Equipment used: Rolling walker (2 wheels) Transfers: Sit  to/from Stand, Bed to chair/wheelchair/BSC Sit to Stand: Min assist, From elevated surface           General transfer comment: Cues for hand placement to and from seated surface.  Poor eccentric load noted back to seated surface.    Ambulation/Gait Ambulation/Gait assistance: Min assist, Mod assist Gait Distance (Feet): 10 Feet (x2 to and from bathroom.) Assistive device: Rolling walker (2 wheels) Gait Pattern/deviations: Step-to pattern, Decreased stride length, Narrow base of support, Trunk flexed Gait velocity: decr     General Gait Details: Cues for posture and sequencing with RW.  Noted buckling in B knee required mod assistance to correct.  Pt very fatigued and weak this session.   Stairs             Wheelchair Mobility     Tilt Bed    Modified Rankin (Stroke Patients Only)       Balance Overall balance assessment: Needs assistance Sitting-balance support: No upper extremity supported, Feet supported Sitting balance-Leahy Scale: Good       Standing balance-Leahy Scale: Poor Standing balance comment: B buckling noted at sink in RW when taking hands off RW to wash his hands.                            Communication    Cognition Arousal: Alert, Lethargic, Suspect due to medications (recent biopsy and groggy from meds) Behavior During Therapy: WFL for tasks assessed/performed   PT - Cognitive impairments: No apparent impairments  Cueing    Exercises      General Comments        Pertinent Vitals/Pain Pain Assessment Pain Assessment: Faces Pain Location: thighs Pain Descriptors / Indicators: Sore, Grimacing, Guarding, Shooting Pain Intervention(s): Monitored during session, Repositioned    Home Living                          Prior Function            PT Goals (current goals can now be found in the care plan section) Acute Rehab PT Goals PT Goal Formulation: With  patient/family Potential to Achieve Goals: Good Progress towards PT goals: Progressing toward goals    Frequency    Min 3X/week      PT Plan      Co-evaluation              AM-PAC PT 6 Clicks Mobility   Outcome Measure  Help needed turning from your back to your side while in a flat bed without using bedrails?: A Lot Help needed moving from lying on your back to sitting on the side of a flat bed without using bedrails?: A Lot Help needed moving to and from a bed to a chair (including a wheelchair)?: A Lot Help needed standing up from a chair using your arms (e.g., wheelchair or bedside chair)?: A Lot Help needed to walk in hospital room?: A Lot Help needed climbing 3-5 steps with a railing? : Total 6 Click Score: 11    End of Session Equipment Utilized During Treatment: Gait belt Activity Tolerance: Patient tolerated treatment well Patient left: with call bell/phone within reach;with family/visitor present;in bed;with bed alarm set Nurse Communication: Mobility status PT Visit Diagnosis: Difficulty in walking, not elsewhere classified (R26.2);Unsteadiness on feet (R26.81);Muscle weakness (generalized) (M62.81)     Time: 8568-8543 PT Time Calculation (min) (ACUTE ONLY): 25 min  Charges:    $Gait Training: 8-22 mins $Therapeutic Activity: 8-22 mins PT General Charges $$ ACUTE PT VISIT: 1 Visit                     Toya HAMS , PTA Acute Rehabilitation Services Office [phone]    Rudolf Blizard JINNY Gosling 08/09/2023, 3:12 PM

## 2023-08-09 NOTE — Progress Notes (Signed)
 MEDICATION-RELATED CONSULT NOTE   IR Procedure Consult - Anticoagulant/Antiplatelet PTA/Inpatient Med List Review by Pharmacist    Procedure: CT core biopsy L retroperitoneal     Completed: 6/30 ~1300  Post-Procedural bleeding risk per IR MD assessment: standard  Antithrombotic medications on inpatient or PTA profile prior to procedure: none (plts are down to 28)  Recommended restart time per IR Post-Procedure Guidelines: N/A  Other considerations:  N/A  Plan:     Pt not on chemical DVT ppx given low plts. Continue with SCDs for now.

## 2023-08-09 NOTE — Procedures (Signed)
  Procedure:  CT core biopsy L retroperitoneal LAN 18g Preprocedure diagnosis: The primary encounter diagnosis was AKI (acute kidney injury) (HCC). Diagnoses of Acute midline low back pain without sciatica and CLL (chronic lymphocytic leukemia) (HCC) were also pertinent to this visit. Postprocedure diagnosis: same EBL:    minimal Complications:   none immediate  See full dictation in [employer].  CHARM Toribio Faes MD Main # [phone] Pager  [phone] Mobile [phone]

## 2023-08-09 NOTE — TOC Initial Note (Signed)
 Transition of Care [facility]) - Initial/Assessment Note    Patient Details  Name: Tyler Scott MRN: [id number] Date of Birth: May 28, 1955  Transition of Care [facility]) CM/SW Contact:    Doneta Glenys DASEN, RN Phone Number: 08/09/2023, 12:30 PM  Clinical Narrative:                 CM spoke with patient in the room. PTA states lives with wife Loralie) in a house:Verified PCP/insurance; Received permission to contract [facility] on his behalf; Denies DME,HH, oxygen or SDOH needs; Patients wife will transport home at discharge. CM called [facility]. Patient receives care at [place], [place], provider is [facility] and Child psychotherapist is Powell Colt [phone]). TOC following.   Expected Discharge Plan: Home w Home Health Services Barriers to Discharge: Continued Medical Work up   Patient Goals and CMS Choice Patient states their goals for this hospitalization and ongoing recovery are:: Home with home Health CMS Medicare.gov Compare Post Acute Care list provided to::  (NA)   [place] ownership interest in [facility].provided to:: Parent NA    Expected Discharge Plan and Services In-house Referral: NA Discharge Planning Services: CM Consult   Living arrangements for the past 2 months: Single Family Home                   DME Agency: NA       HH Arranged: NA HH Agency: NA        Prior Living Arrangements/Services Living arrangements for the past 2 months: Single Family Home Lives with:: Spouse Patient language and need for interpreter reviewed:: Yes Do you feel safe going back to the place where you live?: Yes      Need for Family Participation in Patient Care: No (Comment) Care giver support system in place?: Yes (comment) Current home services:  (NA) Criminal Activity/Legal Involvement Pertinent to Current Situation/Hospitalization: No - Comment as needed  Activities of Daily Living   ADL Screening (condition at time of admission) Independently performs  ADLs?: No Does the patient have a NEW difficulty with bathing/dressing/toileting/self-feeding that is expected to last >3 days?: Yes (Initiates electronic notice to provider for possible OT consult) Does the patient have a NEW difficulty with getting in/out of bed, walking, or climbing stairs that is expected to last >3 days?: Yes (Initiates electronic notice to provider for possible PT consult) Does the patient have a NEW difficulty with communication that is expected to last >3 days?: No Is the patient deaf or have difficulty hearing?: No Does the patient have difficulty seeing, even when wearing glasses/contacts?: No Does the patient have difficulty concentrating, remembering, or making decisions?: No  Permission Sought/Granted Permission sought to share information with : Case Manager                Emotional Assessment Appearance:: Appears stated age Attitude/Demeanor/Rapport: Engaged Affect (typically observed): Appropriate Orientation: : Oriented to Self, Oriented to Place, Oriented to  Time, Oriented to Situation Alcohol / Substance Use: Not Applicable Psych Involvement: No (comment)  Admission diagnosis:  AKI (acute kidney injury) (HCC) [N17.9] Acute midline low back pain without sciatica [M54.50] Patient Active Problem List   Diagnosis Date Noted   Insulin  dependent type 2 diabetes mellitus (HCC) 08/06/2023   AKI (acute kidney injury) (HCC) 08/06/2023   Pancytopenia (HCC) 08/06/2023   Controlled type 2 diabetes mellitus with retinopathy, with long-term current use of insulin  (HCC) 09/14/2016   Hx of adenomatous polyp of colon 05/24/2014   Family history of colon  cancer - brother 63 05/18/2014   Hyperkalemia 01/21/2013   Splenomegaly 01/21/2013   CLL (chronic lymphocytic leukemia) (HCC)    Type 1 diabetes mellitus (HCC) 12/29/2007   Hyperlipidemia 12/30/2006   Essential hypertension 12/30/2006   ERECTILE DYSFUNCTION, ORGANIC 12/30/2006   LOW BACK PAIN, ACUTE  12/21/2006   PCP:  [name], MD Pharmacy:   [phone] - [place], [place] - [address] AT CORNER OF [facility] [address] [place] [address] Phone: [phone] Fax: [phone]  [facility] - [facility] [address] [place] [address] Phone: [phone] Fax: [phone]  [facility] - [place], [place] - [address] [phone] [place] [address] Phone: [phone] Fax: [phone]     Social Drivers of Health (SDOH) Social History: SDOH Screenings   Food Insecurity: No Food Insecurity (08/06/2023)  Housing: Unknown (08/06/2023)  Transportation Needs: No Transportation Needs (08/06/2023)  Utilities: Not At Risk (08/06/2023)  Social Connections: Socially Isolated (08/06/2023)  Tobacco Use: Low Risk  (08/06/2023)   SDOH Interventions:     Readmission Risk Interventions    08/07/2023    8:49 AM  Readmission Risk Prevention Plan  Transportation Screening Complete  PCP or Specialist Appt within 5-7 Days Complete  Home Care Screening Complete  Medication Review (RN CM) Complete

## 2023-08-09 NOTE — Progress Notes (Addendum)
 PROGRESS NOTE  Tyler Scott  FMW:[id number] DOB: 1955/03/30 DOA: 08/06/2023 PCP: [name], MD   Brief Narrative: Patient is a 68 year old male with history of insulin -dependent type type II, hypertension, CLL who presented with a complaint of progressive low back pain, not able to walk, decreased oral intake.  Lab work on presentation showed pancytopenia.  CT abdomen/pelvis showed  Progression of lymphoma lymphadenopathy with increase in bulky retroperitoneal nodal mass.  MRI of the lumbar spine showed Masslike extraosseous paraspinal signal abnormality at multiple Levels.  Oncology consulted.  Plan for  lymph node biopsy Assessment & Plan:  Principal Problem:   AKI (acute kidney injury) (HCC) Active Problems:   CLL (chronic lymphocytic leukemia) (HCC)   Insulin  dependent type 2 diabetes mellitus (HCC)   Pancytopenia (HCC)  CLL/pancytopenia: Presented with low back pain.CT abdomen/pelvis showed  progression of lymphoma lymphadenopathy with increase in bulky retroperitoneal nodal mass.  MRI of the lumbar spine showed masslike extraosseous paraspinal signal abnormality at multiple levels.  History of CLL, was on different therapies.  Has adenopathy in the inguinal area.  Oncology suspecting transformation of CLL to aggressive lymphoma.  Planning for biopsy of one of the lymph nodes.  Started on resbirucase  for high uric acid.  Suspicion of bone marrow involvement.  Oncology eventually planning for starting chemotherapy, Port-A-Cath placement. IR planning for retroperitoneal LN biopsy today. I have sent message to Dr Onesimo for oncology follow up here  AKI: No obstructive pathology as per CT abdomen/pelvis.  Continue hydration.  UA unremarkable.  Kidney function has now normalized.  Lisinopril  will be held on discharge.  Hypertension: Taking lisinopril  at home.  Started on amlodipine  Insulin -dependent diabetes type 2: A1c of 5.8  Continue current insulin  regimen  Failure  to thrive/generalized weakness: PT consulted, recommend CIR  Constipation: Continue bowel regimen         DVT prophylaxis:Place and maintain sequential compression device Start: 08/07/23 1058 Place and maintain sequential compression device Start: 08/07/23 1058Heparin [place]     Code Status: Full Code  Family Communication: None at bedside  Patient status:Inpatient  Patient is from :home  Anticipated discharge to:CIR vs Home  Estimated DC date: After full workup   Consultants: Oncology  Procedures: None  Antimicrobials:  Anti-infectives (From admission, onward)    Start     Dose/Rate Route Frequency Ordered Stop   08/06/23 2145  acyclovir  (ZOVIRAX ) tablet 400 mg        400 mg Oral Daily 08/06/23 2133         Subjective: Patient seen and examined at bedside today.  Sitting in the chair.  Apparently appears comfortable.  Denies any complaints today.  Waiting for lymph node biopsy.  Objective: Vitals:   08/08/23 1916 08/09/23 0444 08/09/23 1245 08/09/23 1255  BP: (!) 152/72 (!) 150/80 (!) 160/79 (!) 149/73  Pulse: (!) 105 99 98 94  Resp: 17 17 20 18   Temp: 98.7 F (37.1 C) 98.1 F (36.7 C)    TempSrc: Oral Oral    SpO2: 99% 96% 100% 100%  Weight:      Height:        Intake/Output Summary (Last 24 hours) at 08/09/2023 1258 Last data filed at 08/09/2023 0757 Gross per 24 hour  Intake --  Output 725 ml  Net -725 ml   Filed Weights   08/06/23 1506  Weight: 65.8 kg    Examination:  General exam: Overall comfortable, not in distress HEENT: PERRL Respiratory system:  no wheezes or crackles  Cardiovascular system: S1 & S2 heard, RRR.  Gastrointestinal system: Abdomen is nondistended, soft and nontender. Central nervous system: Alert and oriented Extremities: No edema, no clubbing ,no cyanosis, enlarged bilateral inguinal lymph nodes. Skin: No rashes, no ulcers,no icterus      Data Reviewed: I have personally reviewed following labs and imaging  studies  CBC: Recent Labs  Lab 08/06/23 1513 08/07/23 0043 08/08/23 0506 08/09/23 0458  WBC 2.9* 2.1* 2.5* 2.7*  NEUTROABS  --  0.7* 0.8* 0.9*  HGB 9.0* 8.9* 8.7* 8.0*  HCT 28.6* 28.9* 28.0* 25.4*  MCV 100.4* 102.8* 103.3* 100.4*  PLT 38* 30* 30* 28*   Basic Metabolic Panel: Recent Labs  Lab 08/06/23 1513 08/07/23 0043 08/08/23 0506 08/09/23 0458  NA 134* 135 136 133*  K 4.4 4.4 4.2 4.2  CL 101 105 106 103  CO2 22 21* 21* 22  GLUCOSE 111* 120* 104* 152*  BUN 45* 40* 32* 28*  CREATININE 2.56* 2.22* 1.42* 1.03  CALCIUM 9.6 9.2 9.1 9.2     Recent Results (from the past 240 hours)  Culture, blood (Routine X 2) w Reflex to ID Panel     Status: None (Preliminary result)   Collection Time: 08/07/23 12:44 AM   Specimen: BLOOD RIGHT ARM  Result Value Ref Range Status   Specimen Description   Final    BLOOD RIGHT ARM Performed at [facility], [address] [place], [place] [address]    Special Requests   Final    BOTTLES DRAWN AEROBIC AND ANAEROBIC Blood Culture adequate volume Performed at [facility], [address] [place], [place] [address]    Culture   Final    NO GROWTH 2 DAYS Performed at [facility], [address], [place], [place] [address]    Report Status PENDING  Incomplete  Culture, blood (Routine X 2) w Reflex to ID Panel     Status: None (Preliminary result)   Collection Time: 08/07/23 12:44 AM   Specimen: BLOOD LEFT ARM  Result Value Ref Range Status   Specimen Description   Final    BLOOD LEFT ARM Performed at [facility], [address] [place], [place] [address]    Special Requests   Final    BOTTLES DRAWN AEROBIC AND ANAEROBIC Blood Culture adequate volume Performed at [facility], [address], [place], [place] [address]    Culture   Final    NO GROWTH 2 DAYS Performed at [facility], [address] [place], [place] [address]    Report Status PENDING  Incomplete      Radiology Studies: No results found.   Scheduled Meds:  acyclovir   400 mg Oral Daily   allopurinol   150 mg Oral Daily   feeding supplement  237 mL Oral BID BM   finasteride  1 mg Oral Daily   gabapentin  100 mg Oral QHS   insulin  aspart  0-9 Units Subcutaneous TID WC   insulin  glargine-yfgn  8 Units Subcutaneous Daily   lidocaine  1 patch Transdermal Q24H   polyethylene glycol  17 g Oral Daily   senna-docusate  1 tablet Oral BID   simvastatin   20 mg Oral QHS   Continuous Infusions:     LOS: 3 days   Ivonne Mustache, MD [facility] P6/30/2025, 12:58 PM

## 2023-08-10 DIAGNOSIS — N179 Acute kidney failure, unspecified: Secondary | ICD-10-CM | POA: Diagnosis not present

## 2023-08-10 DIAGNOSIS — M545 Low back pain, unspecified: Secondary | ICD-10-CM | POA: Diagnosis not present

## 2023-08-10 DIAGNOSIS — D61818 Other pancytopenia: Secondary | ICD-10-CM | POA: Diagnosis not present

## 2023-08-10 DIAGNOSIS — C9110 Chronic lymphocytic leukemia of B-cell type not having achieved remission: Secondary | ICD-10-CM | POA: Diagnosis not present

## 2023-08-10 LAB — CBC WITH DIFFERENTIAL/PLATELET
Abs Immature Granulocytes: 0 10*3/uL (ref 0.00–0.07)
Basophils Absolute: 0 10*3/uL (ref 0.0–0.1)
Basophils Relative: 0 %
Eosinophils Absolute: 0 10*3/uL (ref 0.0–0.5)
Eosinophils Relative: 1 %
HCT: 23.7 % — ABNORMAL LOW (ref 39.0–52.0)
Hemoglobin: 7.7 g/dL — ABNORMAL LOW (ref 13.0–17.0)
Lymphocytes Relative: 46 %
Lymphs Abs: 1.4 10*3/uL (ref 0.7–4.0)
MCH: 32.1 pg (ref 26.0–34.0)
MCHC: 32.5 g/dL (ref 30.0–36.0)
MCV: 98.8 fL (ref 80.0–100.0)
Monocytes Absolute: 0.1 10*3/uL (ref 0.1–1.0)
Monocytes Relative: 4 %
Neutro Abs: 1.5 10*3/uL — ABNORMAL LOW (ref 1.7–7.7)
Neutrophils Relative %: 49 %
Platelets: 29 10*3/uL — CL (ref 150–400)
RBC: 2.4 MIL/uL — ABNORMAL LOW (ref 4.22–5.81)
RDW: 16.1 % — ABNORMAL HIGH (ref 11.5–15.5)
WBC: 3 10*3/uL — ABNORMAL LOW (ref 4.0–10.5)
nRBC: 0 % (ref 0.0–0.2)

## 2023-08-10 LAB — COMPREHENSIVE METABOLIC PANEL WITH GFR
ALT: 8 U/L (ref 0–44)
AST: 15 U/L (ref 15–41)
Albumin: 3.2 g/dL — ABNORMAL LOW (ref 3.5–5.0)
Alkaline Phosphatase: 61 U/L (ref 38–126)
Anion gap: 9 (ref 5–15)
BUN: 23 mg/dL (ref 8–23)
CO2: 21 mmol/L — ABNORMAL LOW (ref 22–32)
Calcium: 9.3 mg/dL (ref 8.9–10.3)
Chloride: 98 mmol/L (ref 98–111)
Creatinine, Ser: 0.94 mg/dL (ref 0.61–1.24)
GFR, Estimated: >60 mL/min (ref >60)
Glucose, Bld: 157 mg/dL — ABNORMAL HIGH (ref 70–99)
Potassium: 4.3 mmol/L (ref 3.5–5.1)
Sodium: 128 mmol/L — ABNORMAL LOW (ref 135–145)
Total Bilirubin: 1.2 mg/dL (ref 0.0–1.2)
Total Protein: 6 g/dL — ABNORMAL LOW (ref 6.5–8.1)

## 2023-08-10 LAB — GLUCOSE, CAPILLARY
Glucose-Capillary: 165 mg/dL — ABNORMAL HIGH (ref 70–99)
Glucose-Capillary: 151 mg/dL — ABNORMAL HIGH (ref 70–99)
Glucose-Capillary: 185 mg/dL — ABNORMAL HIGH (ref 70–99)
Glucose-Capillary: 142 mg/dL — ABNORMAL HIGH (ref 70–99)

## 2023-08-10 MED ORDER — SODIUM CHLORIDE 0.9 % IV SOLN: INTRAVENOUS | Status: DC

## 2023-08-10 NOTE — Progress Notes (Signed)
 Occupational Therapy Treatment Patient Details Name: Tyler Scott MRN: [id number] DOB: 08-30-1955 Today's Date: 08/10/2023   History of present illness 68 year old male who presented with a complaint of progressive low back pain, not able to walk, decreased oral intake.  Lab work on presentation showed pancytopenia.  CT abdomen/pelvis showed progression of lymphoma lymphadenopathy with increase in bulky retroperitoneal nodal mass.  MRI of the lumbar spine showed mass-like extraosseous paraspinal signal abnormality at multiple  Levels.  Oncology consulted.  Plan for possible lymph node biopsy, Port-A-Cath placement for starting of chemotherapy.  PMHx:  insulin -dependent type type II, hypertension, CLL   OT comments  The pt was pleasant and motivated to participate in the session. He reported pain at prior biopsy site, as well as radiating pain from his R hip down to his distal R LE; due to this, he has had difficulty with performing lower body self-care. OT subsequently provided education and instruction on compensatory strategies and equipment recommendations to optimize his ADL participation and performance (see ADL section below). He further required CGA to stand using a RW. Continue OT plan of care. Patient will benefit from intensive inpatient follow-up therapy, >3 hours/day.       If plan is discharge home, recommend the following:  A little help with bathing/dressing/bathroom;A lot of help with bathing/dressing/bathroom;Help with stairs or ramp for entrance;Assist for transportation;Assistance with cooking/housework   Equipment Recommendations  Other (comment) (reacher, sock aid)    Recommendations for Other Services      Precautions / Restrictions Precautions Precautions: Fall Restrictions Weight Bearing Restrictions Per Provider Order: No       Mobility Bed Mobility               General bed mobility comments: pt was received seated in the chair     Transfers Overall transfer level: Needs assistance Equipment used: Rolling walker (2 wheels) Transfers: Sit to/from Stand Sit to Stand: Contact guard assist                 Balance     Sitting balance-Leahy Scale: Good       Standing balance-Leahy Scale: Fair        ADL either performed or assessed with clinical judgement   ADL Overall ADL's : Needs assistance/impaired Eating/Feeding: Independent;Sitting Eating/Feeding Details (indicate cue type and reason): Pt was noted to drink from a cup without difficulty seated in the chair.                 Lower Body Dressing: Moderate assistance;Sitting/lateral leans Lower Body Dressing Details (indicate cue type and reason): The pt reported having radiating pain from his R hip all the way down to his distal R LE. As such, he has had difficulty with performing lower body dressing tasks. As sush, OT instructed him the use of AE to assist with performing lower body self-care. Specifically, OT instructed him using a reacher to doff socks, sock aid to don socks, and reacher to don lower body clothing articles. He provided teach back on using a reacher to doff socks and sock aid to don socks, while seated in the bedside chair.               General ADL Comments: OT further educated the pt on the option for use of a toilet riser for improved ease with transferring onto and off a toilet, as well as the recommendation for use of a shower seat, hand-held shower hose, and long-handled sponge for lower body bathing.  Extremity/Trunk Assessment                       Communication Communication Communication: No apparent difficulties   Cognition Arousal: Alert Behavior During Therapy: WFL for tasks assessed/performed          Following commands: Intact                  Pertinent Vitals/ Pain       Pain Assessment Pain Assessment: Faces Pain Score: 5  Pain Location:  (biopsy site (lumbar)) Pain  Intervention(s): Monitored during session, Repositioned   Frequency  Min 2X/week        Progress Toward Goals  OT Goals(current goals can now be found in the care plan section)  Progress towards OT goals: Progressing toward goals  Acute Rehab OT Goals OT Goal Formulation: With patient Time For Goal Achievement: 08/22/23 Potential to Achieve Goals: Good  Plan         AM-PAC OT 6 Clicks Daily Activity     Outcome Measure   Help from another person eating meals?: None Help from another person taking care of personal grooming?: A Little Help from another person toileting, which includes using toliet, bedpan, or urinal?: A Little Help from another person bathing (including washing, rinsing, drying)?: A Lot Help from another person to put on and taking off regular upper body clothing?: A Little Help from another person to put on and taking off regular lower body clothing?: A Lot 6 Click Score: 17    End of Session Equipment Utilized During Treatment: Rolling walker (2 wheels)  OT Visit Diagnosis: Unsteadiness on feet (R26.81);Other abnormalities of gait and mobility (R26.89);Muscle weakness (generalized) (M62.81);Pain Pain - part of body:  (low back)   Activity Tolerance Patient limited by pain   Patient Left in chair;with call bell/phone within reach   Nurse Communication Mobility status        Time: 1637-1700 OT Time Calculation (min): 23 min  Charges: OT General Charges $OT Visit: 1 Visit OT Treatments $Self Care/Home Management : 23-37 mins      Delanna LITTIE Molt, OTR/L 08/10/2023, 5:28 PM

## 2023-08-10 NOTE — Progress Notes (Addendum)
 PROGRESS NOTE  Tyler Scott  FMW:[id number] DOB: 08-07-1955 DOA: 08/06/2023 PCP: [name], MD   Brief Narrative: Patient is a 68 year old male with history of insulin -dependent type type II, hypertension, CLL who presented with a complaint of progressive low back pain, not able to walk, decreased oral intake.  Lab work on presentation showed pancytopenia.  CT abdomen/pelvis showed  Progression of lymphoma lymphadenopathy with increase in bulky retroperitoneal nodal mass.  MRI of the lumbar spine showed Masslike extraosseous paraspinal signal abnormality at multiple levels.  Oncology consulted and following.  S/P  lymph node biopsy  Assessment & Plan:  Principal Problem:   AKI (acute kidney injury) (HCC) Active Problems:   CLL (chronic lymphocytic leukemia) (HCC)   Insulin  dependent type 2 diabetes mellitus (HCC)   Pancytopenia (HCC)  CLL/pancytopenia: Presented with low back pain.CT abdomen/pelvis showed  progression of lymphoma lymphadenopathy with increase in bulky retroperitoneal nodal mass.  MRI of the lumbar spine showed masslike extraosseous paraspinal signal abnormality at multiple levels.  History of CLL, was on different therapies.  Has adenopathy in the inguinal area.  Oncology suspecting transformation of CLL to aggressive lymphoma.  Suspicion of bone marrow involvement.  Given rasburicase  for high uric acid  level.  Oncology eventually planning for starting chemotherapy, Port-A-Cath placement. S/P retroperitoneal LN biopsy on 6/30. Result pending. Hemoglobin in the range of 7, PTH level in the range of 29,000 today.  AKI: No obstructive pathology as per CT abdomen/pelvis.  Continue hydration.  UA unremarkable.  Kidney function has now normalized.  Lisinopril  will be held on discharge.  Hyponatremia: Started on gentle normal saline today.  Check BMP tomorrow.  He appears euvolemic  Hypertension: Taking lisinopril  at home.  Started on  amlodipine  Insulin -dependent diabetes type 2: A1c of 5.8  Continue current insulin  regimen  Failure to thrive/generalized weakness: PT consulted, recommend CIR  Constipation: Continue bowel regimen         DVT prophylaxis:Place and maintain sequential compression device Start: 08/07/23 1058 Place and maintain sequential compression device Start: 08/07/23 1058Heparin [place]     Code Status: Full Code  Family Communication: None at bedside  Patient status:Inpatient  Patient is from :home  Anticipated discharge to:CIR vs Home  Estimated DC date: After full workup   Consultants: Oncology  Procedures: None  Antimicrobials:  Anti-infectives (From admission, onward)    Start     Dose/Rate Route Frequency Ordered Stop   08/06/23 2145  acyclovir  (ZOVIRAX ) tablet 400 mg        400 mg Oral Daily 08/06/23 2133         Subjective: Patient seen and examined at the bedside today.  Appears comfortable.  Lying in bed.  Denies any new complaints.  No abdomen pain, shortness of breath or chest pain today.  Underwent lymph node biopsy yesterday.  Inquiring the result but results not available yet.  Updated the patient that his oncologist will see him today.  He is agreeable for acute inpatient rehab if needed  Objective: Vitals:   08/09/23 1335 08/09/23 2107 08/10/23 0352 08/10/23 1236  BP: 133/64 139/72 (!) 142/71 133/64  Pulse: 88 100 93 96  Resp: 16 17 17    Temp: 98.2 F (36.8 C) 98.6 F (37 C) 98.6 F (37 C) 97.8 F (36.6 C)  TempSrc: Oral  Oral Oral  SpO2: 100% 100% 98% 100%  Weight:      Height:        Intake/Output Summary (Last 24 hours) at 08/10/2023 1244 Last  data filed at 08/10/2023 0800 Gross per 24 hour  Intake --  Output 475 ml  Net -475 ml   Filed Weights   08/06/23 1506  Weight: 65.8 kg    Examination:  General exam: Overall comfortable, not in distress HEENT: PERRL Respiratory system:  no wheezes or crackles  Cardiovascular system: S1 & S2 heard,  RRR.  Gastrointestinal system: Abdomen is nondistended, soft and nontender. Central nervous system: Alert and oriented Extremities: No edema, no clubbing ,no cyanosis Skin: No rashes, no ulcers,no icterus  , enlarged bilateral inguinal lymph nodes   Data Reviewed: I have personally reviewed following labs and imaging studies  CBC: Recent Labs  Lab 08/06/23 1513 08/07/23 0043 08/08/23 0506 08/09/23 0458 08/10/23 0450  WBC 2.9* 2.1* 2.5* 2.7* 3.0*  NEUTROABS  --  0.7* 0.8* 0.9* 1.5*  HGB 9.0* 8.9* 8.7* 8.0* 7.7*  HCT 28.6* 28.9* 28.0* 25.4* 23.7*  MCV 100.4* 102.8* 103.3* 100.4* 98.8  PLT 38* 30* 30* 28* 29*   Basic Metabolic Panel: Recent Labs  Lab 08/06/23 1513 08/07/23 0043 08/08/23 0506 08/09/23 0458 08/10/23 0450  NA 134* 135 136 133* 128*  K 4.4 4.4 4.2 4.2 4.3  CL 101 105 106 103 98  CO2 22 21* 21* 22 21*  GLUCOSE 111* 120* 104* 152* 157*  BUN 45* 40* 32* 28* 23  CREATININE 2.56* 2.22* 1.42* 1.03 0.94  CALCIUM 9.6 9.2 9.1 9.2 9.3     Recent Results (from the past 240 hours)  Culture, blood (Routine X 2) w Reflex to ID Panel     Status: None (Preliminary result)   Collection Time: 08/07/23 12:44 AM   Specimen: BLOOD RIGHT ARM  Result Value Ref Range Status   Specimen Description   Final    BLOOD RIGHT ARM Performed at [facility], [address] [place], [place] [address]    Special Requests   Final    BOTTLES DRAWN AEROBIC AND ANAEROBIC Blood Culture adequate volume Performed at [facility], [address], [place], [place] [address]    Culture   Final    NO GROWTH 3 DAYS Performed at [facility], [address], [place], [place] [address]    Report Status PENDING  Incomplete  Culture, blood (Routine X 2) w Reflex to ID Panel     Status: None (Preliminary result)   Collection Time: 08/07/23 12:44 AM   Specimen: BLOOD LEFT ARM  Result Value Ref Range Status   Specimen Description   Final    BLOOD LEFT  ARM Performed at [facility], [address] [place], [place] [address]    Special Requests   Final    BOTTLES DRAWN AEROBIC AND ANAEROBIC Blood Culture adequate volume Performed at [facility], [address], [place], [place] [address]    Culture   Final    NO GROWTH 3 DAYS Performed at [facility], [address] [place], [place] [address]    Report Status PENDING  Incomplete     Radiology Studies: CT BIOPSY Result Date: 08/09/2023 CLINICAL DATA:  Progressive retroperitoneal adenopathy, history of CLL EXAM: CT GUIDED CORE BIOPSY OF LEFT PARA-AORTIC ADENOPATHY ANESTHESIA/SEDATION: Intravenous Fentanyl 200mcg and Versed 4mg  were administered by RN during a total moderate (conscious) sedation time of 25 minutes; the patient's level of consciousness and physiological / cardiorespiratory status were monitored continuously by radiology RN under my direct supervision. PROCEDURE: The procedure risks, benefits, and alternatives were explained  to the patient. Questions regarding the procedure were encouraged and answered. The patient understands and consents to the procedure. patient placed prone. select axial scans through the abdomen obtained, adenopathy localized, and an appropriate skin entry site determined and marked. The operative field was prepped with chlorhexidinein a sterile fashion, and a sterile drape was applied covering the operative field. A sterile gown and sterile gloves were used for the procedure. Local anesthesia was provided with 1% Lidocaine. Under CT fluoroscopic guidance, a 17 gauge trocar needle was advanced to the margin of the lesion. Once needle tip position was confirmed, coaxial 18-gauge core biopsy samples were obtained, submitted in saline to surgical pathology. The guide needle was removed. Postprocedure scans show no hemorrhage or other apparent complication. The patient tolerated the procedure well. RADIATION DOSE REDUCTION: This exam  was performed according to the departmental dose-optimization program which includes automated exposure control, adjustment of the mA and/or kV according to patient size and/or use of iterative reconstruction technique. COMPLICATIONS: None immediate FINDINGS: Bulky left para-aortic and aortocaval adenopathy localized. Representative core biopsy samples obtained as above. IMPRESSION: 1. Technically successful CT-guided core biopsy, left para-aortic retroperitoneal adenopathy. Electronically Signed   By: JONETTA Faes M.D.   On: 08/09/2023 15:52     Scheduled Meds:  acyclovir   400 mg Oral Daily   allopurinol   150 mg Oral Daily   amLODipine  10 mg Oral Daily   finasteride  1 mg Oral Daily   gabapentin  100 mg Oral QHS   insulin  aspart  0-9 Units Subcutaneous TID WC   insulin  glargine-yfgn  8 Units Subcutaneous Daily   lidocaine  1 patch Transdermal Q24H   polyethylene glycol  17 g Oral Daily   senna-docusate  1 tablet Oral BID   simvastatin   20 mg Oral QHS   Continuous Infusions:  sodium chloride  75 mL/hr at 08/10/23 0855      LOS: 4 days   Ivonne Mustache, MD [facility] P7/02/2023, 12:44 PM

## 2023-08-10 NOTE — Plan of Care (Signed)

## 2023-08-10 NOTE — TOC Progression Note (Signed)
 Transition of Care [facility]) - Progression Note    Patient Details  Name: Tyler Scott MRN: [id number] Date of Birth: Apr 29, 1955  [facility] CM/SW Contact  Doneta Glenys DASEN, RN Phone Number: 08/10/2023, 11:51 AM  Clinical Narrative:    CM spoke with patient about PT recommendation for SNF. [facility]   Expected Discharge Plan: Home w Home Health Services Barriers to Discharge: Continued Medical Work up  Expected Discharge Plan and Services In-house Referral: NA Discharge Planning Services: CM Consult   Living arrangements for the past 2 months: Single Family Home                   DME Agency: NA       HH Arranged: NA HH Agency: NA         Social Determinants of Health (SDOH) Interventions SDOH Screenings   Food Insecurity: No Food Insecurity (08/06/2023)  Housing: Low Risk  (08/10/2023)  Transportation Needs: No Transportation Needs (08/06/2023)  Utilities: Not At Risk (08/06/2023)  Social Connections: Socially Isolated (08/06/2023)  Tobacco Use: Low Risk  (08/06/2023)    Readmission Risk Interventions    08/07/2023    8:49 AM  Readmission Risk Prevention Plan  Transportation Screening Complete  PCP or Specialist Appt within 5-7 Days Complete  Home Care Screening Complete  Medication Review (RN CM) Complete

## 2023-08-10 NOTE — Progress Notes (Signed)
 Physical Therapy Treatment Patient Details Name: Tyler Scott MRN: [id number] DOB: 03-01-1955 Today's Date: 08/10/2023   History of Present Illness 68 year old male who presented with a complaint of progressive low back pain, not able to walk, decreased oral intake.  Lab work on presentation showed pancytopenia.  CT abdomen/pelvis showed progression of lymphoma lymphadenopathy with increase in bulky retroperitoneal nodal mass.  MRI of the lumbar spine showed Masslike extraosseous paraspinal signal abnormality at multiple  Levels.  Oncology consulted.  Plan for possible lymph node biopsy, Port-A-Cath placement for starting of chemotherapy.  PMHx:  insulin -dependent type type II, hypertension, CLL    PT Comments  Pt agreeable to PT session this afternoon, able to come to sit EOB with CGA, maintains balance and progresses to standing at supervision A with RW. Pt states that he urgently needs to use the rest room and is assisted to and from the bathroom. He continues session with amb in the hallway x24ft with RW at [facility], cues for use of walker (pushing vs lifting), able to improve quality of amb. Educated on signs of edema and early intervention to manage before symptoms exacerbate. Based on pt PLOF, level of support, and current functional status, pt will benefit from skilled physical therapy >3 hours per day at dc for safe progression of functional mobility.    If plan is discharge home, recommend the following: Assistance with cooking/housework;Assist for transportation;Help with stairs or ramp for entrance;A little help with walking and/or transfers;A little help with bathing/dressing/bathroom   Can travel by private vehicle        Equipment Recommendations  Rolling walker (2 wheels)    Recommendations for Other Services       Precautions / Restrictions Precautions Precautions: Fall Recall of Precautions/Restrictions: Intact Restrictions Weight Bearing Restrictions Per Provider  Order: No     Mobility  Bed Mobility Overal bed mobility: Needs Assistance Bed Mobility: Sidelying to Sit Rolling: Contact guard assist Sidelying to sit: Contact guard assist       General bed mobility comments: requires HHA for trunk management during transfer, remains in recliner at end of session    Transfers Overall transfer level: Needs assistance Equipment used: Rolling walker (2 wheels) Transfers: Sit to/from Stand Sit to Stand: Supervision                Ambulation/Gait Ambulation/Gait assistance: Contact guard assist Gait Distance (Feet): 40 Feet Assistive device: Rolling walker (2 wheels) Gait Pattern/deviations: Step-to pattern, Decreased stride length, Narrow base of support, Trunk flexed, Knees buckling Gait velocity: decr     General Gait Details: pt able to demonstrate improved UE use during times of knee instability with gait, able to complete 55ft today with RW, cued for AD advancement. pt still learning how to use walker. reviewed with pt and verbalizes understanding   Stairs             Wheelchair Mobility     Tilt Bed    Modified Rankin (Stroke Patients Only)       Balance Overall balance assessment: Needs assistance Sitting-balance support: No upper extremity supported, Feet supported Sitting balance-Leahy Scale: Good     Standing balance support: Reliant on assistive device for balance, Single extremity supported Standing balance-Leahy Scale: Fair Standing balance comment: able to remove 1 UE at a time to Actuary Communication Communication: No  apparent difficulties  Cognition Arousal: Alert Behavior During Therapy: WFL for tasks assessed/performed   PT - Cognitive impairments: No apparent impairments                         Following commands: Intact      Cueing Cueing Techniques: Verbal cues  Exercises      General Comments General comments (skin  integrity, edema, etc.): no edema present at this session, educated on monitoring and implementing elevation and exercise, follow up with lymphedema therapist on OP setting as indicated      Pertinent Vitals/Pain Pain Assessment Pain Assessment: Faces Faces Pain Scale: Hurts even more Pain Location: biopsy site (lumbar) and BLE R>L Pain Descriptors / Indicators: Sore, Grimacing, Guarding, Shooting Pain Intervention(s): Limited activity within patient's tolerance, Monitored during session, Repositioned    Home Living                          Prior Function            PT Goals (current goals can now be found in the care plan section) Acute Rehab PT Goals PT Goal Formulation: With patient/family Time For Goal Achievement: 08/21/23 Potential to Achieve Goals: Good Progress towards PT goals: Progressing toward goals    Frequency    Min 3X/week      PT Plan      Co-evaluation              AM-PAC PT 6 Clicks Mobility   Outcome Measure  Help needed turning from your back to your side while in a flat bed without using bedrails?: A Little Help needed moving from lying on your back to sitting on the side of a flat bed without using bedrails?: A Little Help needed moving to and from a bed to a chair (including a wheelchair)?: A Little Help needed standing up from a chair using your arms (e.g., wheelchair or bedside chair)?: A Little Help needed to walk in hospital room?: A Little Help needed climbing 3-5 steps with a railing? : A Lot 6 Click Score: 17    End of Session Equipment Utilized During Treatment: Gait belt Activity Tolerance: Patient tolerated treatment well Patient left: with call bell/phone within reach;in chair;with chair alarm set Nurse Communication: Mobility status PT Visit Diagnosis: Difficulty in walking, not elsewhere classified (R26.2);Unsteadiness on feet (R26.81);Muscle weakness (generalized) (M62.81)     Time: 8456-8382 PT Time  Calculation (min) (ACUTE ONLY): 34 min  Charges:    $Gait Training: 23-37 mins PT General Charges $$ ACUTE PT VISIT: 1 Visit                     Stann, PT Acute Rehabilitation Services Office: [phone] 08/10/2023    Stann DELENA Ohara 08/10/2023, 4:30 PM

## 2023-08-10 NOTE — Progress Notes (Signed)
 Tyler Scott   DOB:June 05, 1955   FM#:[id number]      ASSESSMENT & PLAN:  Tyler Scott is a 68 year old male patient with oncologic history significant for CLL. He was admitted on 08/06/23 with complaints of progressive low back pain, unable to walk x3 days.    CLL/SLL Lymphadenopathy -- Diagnosed in 2012 -- status post several previous therapies including Bendamustine  and rituximab , Ibrutinib , and most recently on Calquence  since 2023.  -- Patient was supposed to be on Gazyva , venetoclax . He had many interruped cycles of Gazyva  due to No shows.  He was last seen in outpatient oncology on 07/06/23 at which time he was scheduled for C4D1 Gazyva  however was HELD due to infection/acute left sided epidydimo-orchitis for which he was on antibiotics.  -- Imaging done 6/27 shows progressive lymphadenopathy, may be CLL progression vs large cell transformation. -- Biopsy of RP lymph node done 6/30, path pending. -- Further treatment will be based on bx results. Will follow for results. -- Medical Oncology/Dr. Onesimo following closely.  Pancytopenia: -- Secondary to malignancy Leukopenia --WBC low 3.0. No GCS-F recommended. Anemia --Hemoglobin low 7.7.  No PRBC transfusion at this time.  Recommend PRBC transfusion for HGB less than 7.0. Thrombocytopenia --Platelets low 29K.  Recommend transfusion for counts <20K or <50K with bleeding. --Continue to monitor CBC with differential  AKI -- Renal function improving -- Continue to monitor CMP  Code Status Full  Subjective:  Patient seen awake and alert. Assisted to bathroom with walker.  Patient pleasant and discussing his oncologic and medical history.  Listened empathetically.  Patient is aware that his bx results will determine further treatment.  Asking if he can stay inpatient until resulted because his wife is unable to care for him at home as she is not usually at home due to work responsibilities.  No other acute distress is noted.    Objective:   Intake/Output Summary (Last 24 hours) at 08/10/2023 1002 Last data filed at 08/10/2023 0800 Gross per 24 hour  Intake --  Output 475 ml  Net -475 ml     PHYSICAL EXAMINATION: ECOG PERFORMANCE STATUS: 2 - Symptomatic, <50% confined to bed  Vitals:   08/09/23 2107 08/10/23 0352  BP: 139/72 (!) 142/71  Pulse: 100 93  Resp: 17 17  Temp: 98.6 F (37 C) 98.6 F (37 C)  SpO2: 100% 98%   Filed Weights   08/06/23 1506  Weight: 145 lb (65.8 kg)    GENERAL: alert, no distress and comfortable SKIN: +pale skin color, texture, turgor are normal, no rashes or significant lesions EYES: normal, conjunctiva are pink and non-injected, sclera clear OROPHARYNX: no exudate, no erythema and lips, buccal mucosa, and tongue normal  NECK: supple, thyroid  normal size, non-tender, without nodularity LYMPH: no palpable lymphadenopathy in the cervical, axillary or inguinal LUNGS: clear to auscultation and percussion with normal breathing effort HEART: regular rate & rhythm and no murmurs and no lower extremity edema ABDOMEN: abdomen soft, non-tender and normal bowel sounds +ambulates with walker MUSCULOSKELETAL: no cyanosis of digits and no clubbing  PSYCH: alert & oriented x 3 with fluent speech NEURO: no focal motor/sensory deficits   All questions were answered. The patient knows to call the clinic with any problems, questions or concerns.   The total time spent in the appointment was 40 minutes encounter with patient including review of chart and various tests results, discussions about plan of care and coordination of care plan  Olam Tyler Brunner, NP 08/10/2023 10:02  AM    Labs Reviewed:  Lab Results  Component Value Date   WBC 3.0 (L) 08/10/2023   HGB 7.7 (L) 08/10/2023   HCT 23.7 (L) 08/10/2023   MCV 98.8 08/10/2023   PLT 29 (LL) 08/10/2023   Recent Labs    08/08/23 0506 08/09/23 0458 08/10/23 0450  NA 136 133* 128*  K 4.2 4.2 4.3  CL 106 103 98  CO2 21* 22 21*   GLUCOSE 104* 152* 157*  BUN 32* 28* 23  CREATININE 1.42* 1.03 0.94  CALCIUM 9.1 9.2 9.3  GFRNONAA 54* >60 >60  PROT 6.1* 5.9* 6.0*  ALBUMIN 3.4* 3.2* 3.2*  AST 16 14* 15  ALT 8 8 8   ALKPHOS 65 63 61  BILITOT 1.1 1.0 1.2    Studies Reviewed:  CT BIOPSY Result Date: 08/09/2023 CLINICAL DATA:  Progressive retroperitoneal adenopathy, history of CLL EXAM: CT GUIDED CORE BIOPSY OF LEFT PARA-AORTIC ADENOPATHY ANESTHESIA/SEDATION: Intravenous Fentanyl 200mcg and Versed 4mg  were administered by RN during a total moderate (conscious) sedation time of 25 minutes; the patient's level of consciousness and physiological / cardiorespiratory status were monitored continuously by radiology RN under my direct supervision. PROCEDURE: The procedure risks, benefits, and alternatives were explained to the patient. Questions regarding the procedure were encouraged and answered. The patient understands and consents to the procedure. patient placed prone. select axial scans through the abdomen obtained, adenopathy localized, and an appropriate skin entry site determined and marked. The operative field was prepped with chlorhexidinein a sterile fashion, and a sterile drape was applied covering the operative field. A sterile gown and sterile gloves were used for the procedure. Local anesthesia was provided with 1% Lidocaine. Under CT fluoroscopic guidance, a 17 gauge trocar needle was advanced to the margin of the lesion. Once needle tip position was confirmed, coaxial 18-gauge core biopsy samples were obtained, submitted in saline to surgical pathology. The guide needle was removed. Postprocedure scans show no hemorrhage or other apparent complication. The patient tolerated the procedure well. RADIATION DOSE REDUCTION: This exam was performed according to the departmental dose-optimization program which includes automated exposure control, adjustment of the mA and/or kV according to patient size and/or use of iterative  reconstruction technique. COMPLICATIONS: None immediate FINDINGS: Bulky left para-aortic and aortocaval adenopathy localized. Representative core biopsy samples obtained as above. IMPRESSION: 1. Technically successful CT-guided core biopsy, left para-aortic retroperitoneal adenopathy. Electronically Signed   By: JONETTA Faes M.D.   On: 08/09/2023 15:52   ECHOCARDIOGRAM COMPLETE Result Date: 08/07/2023    ECHOCARDIOGRAM REPORT   Patient Name:   KADRIAN PARTCH Date of Exam: 08/07/2023 Medical Rec #:  [id number]           Height:       68.0 in Accession #:    [id number]          Weight:       145.0 lb Date of Birth:  Feb 15, 1955          BSA:          1.783 m Patient Age:    67 years            BP:           136/70 mmHg Patient Gender: M                   HR:           100 bpm. Exam Location:  Inpatient Procedure: 2D Echo, Cardiac Doppler and Color Doppler (Both Spectral  and Color            Flow Doppler were utilized during procedure). Indications:    Chemo  History:        Patient has no prior history of Echocardiogram examinations.                 Risk Factors:Diabetes.  Sonographer:    Benard Stallion Referring Phys: [id number] PETER R ENNEVER IMPRESSIONS  1. Left ventricular ejection fraction, by estimation, is 60 to 65%. The left ventricle has normal function. The left ventricle has no regional wall motion abnormalities. There is mild left ventricular hypertrophy. Left ventricular diastolic parameters are consistent with Grade I diastolic dysfunction (impaired relaxation).  2. Right ventricular systolic function is normal. The right ventricular size is normal.  3. The mitral valve is normal in structure. No evidence of mitral valve regurgitation. No evidence of mitral stenosis.  4. The tricuspid valve is abnormal. Tricuspid valve regurgitation is mild to moderate.  5. The aortic valve is tricuspid. Aortic valve regurgitation is not visualized. No aortic stenosis is present. FINDINGS  Left Ventricle: Left  ventricular ejection fraction, by estimation, is 60 to 65%. The left ventricle has normal function. The left ventricle has no regional wall motion abnormalities. The left ventricular internal cavity size was normal in size. There is  mild left ventricular hypertrophy. Left ventricular diastolic parameters are consistent with Grade I diastolic dysfunction (impaired relaxation). Normal left ventricular filling pressure. Right Ventricle: IVC not visualized, not able to estimate PASP. The right ventricular size is normal. Right vetricular wall thickness was not well visualized. Right ventricular systolic function is normal. Left Atrium: Left atrial size was normal in size. Right Atrium: Right atrial size was normal in size. Pericardium: There is no evidence of pericardial effusion. Mitral Valve: The mitral valve is normal in structure. There is mild thickening of the mitral valve leaflet(s). There is mild calcification of the mitral valve leaflet(s). Mild mitral annular calcification. No evidence of mitral valve regurgitation. No evidence of mitral valve stenosis. Tricuspid Valve: The tricuspid valve is abnormal. Tricuspid valve regurgitation is mild to moderate. No evidence of tricuspid stenosis. Aortic Valve: The aortic valve is tricuspid. Aortic valve regurgitation is not visualized. No aortic stenosis is present. Aortic valve mean gradient measures 4.0 mmHg. Aortic valve peak gradient measures 7.8 mmHg. Aortic valve area, by VTI measures 3.18 cm. Pulmonic Valve: The pulmonic valve was not well visualized. Pulmonic valve regurgitation is not visualized. No evidence of pulmonic stenosis. Aorta: The aortic root and ascending aorta are structurally normal, with no evidence of dilitation. IAS/Shunts: The interatrial septum was not well visualized.  LEFT VENTRICLE PLAX 2D LVIDd:         4.20 cm   Diastology LVIDs:         2.60 cm   LV e' medial:    7.29 cm/s LV PW:         1.10 cm   LV E/e' medial:  12.9 LV IVS:         1.10 cm   LV e' lateral:   7.51 cm/s LVOT diam:     2.20 cm   LV E/e' lateral: 12.5 LV SV:         74 LV SV Index:   41 LVOT Area:     3.80 cm  RIGHT VENTRICLE RV Basal diam:  3.55 cm RV Mid diam:    3.30 cm RV S prime:     14.40 cm/s TAPSE (M-mode): 3.0 cm  LEFT ATRIUM             Index        RIGHT ATRIUM           Index LA diam:        3.20 cm 1.80 cm/m   RA Area:     13.40 cm LA Vol (A2C):   52.1 ml 29.23 ml/m  RA Volume:   34.40 ml  19.30 ml/m LA Vol (A4C):   28.9 ml 16.21 ml/m LA Biplane Vol: 38.6 ml 21.65 ml/m  AORTIC VALVE AV Area (Vmax):    3.18 cm AV Area (Vmean):   3.22 cm AV Area (VTI):     3.18 cm AV Vmax:           140.00 cm/s AV Vmean:          93.700 cm/s AV VTI:            0.232 m AV Peak Grad:      7.8 mmHg AV Mean Grad:      4.0 mmHg LVOT Vmax:         117.00 cm/s LVOT Vmean:        79.300 cm/s LVOT VTI:          0.194 m LVOT/AV VTI ratio: 0.84  AORTA Ao Root diam: 3.40 cm Ao Asc diam:  3.00 cm MITRAL VALVE                TRICUSPID VALVE MV Area (PHT): 3.99 cm     TR Peak grad:   48.2 mmHg MV Decel Time: 190 msec     TR Vmax:        347.00 cm/s MV E velocity: 93.80 cm/s MV A velocity: 119.00 cm/s  SHUNTS MV E/A ratio:  0.79         Systemic VTI:  0.19 m                             Systemic Diam: 2.20 cm Dorn Ross MD Electronically signed by Dorn Ross MD Signature Date/Time: 08/07/2023/12:33:53 PM    Final    CT CHEST WO CONTRAST Result Date: 08/07/2023 CLINICAL DATA:  History of CLL.  Hematologic malignancy staging. EXAM: CT CHEST WITHOUT CONTRAST TECHNIQUE: Multidetector CT imaging of the chest was performed following the standard protocol without IV contrast. RADIATION DOSE REDUCTION: This exam was performed according to the departmental dose-optimization program which includes automated exposure control, adjustment of the mA and/or kV according to patient size and/or use of iterative reconstruction technique. COMPARISON:  CT abdomen and pelvis 08/06/2023.  PET-CT  03/05/2023 FINDINGS: Cardiovascular: Heart size is normal without significant pericardial fluid. Coronary artery calcifications. Atherosclerotic calcifications in thoracic aorta without aneurysm. Mediastinum/Nodes: Compared to the PET-CT from 03/05/2023, there has been enlargement lymph nodes throughout the chest. Nodal tissue in the left supraclavicular area adjacent to the left thyroid  lobe measures 3.2 cm in the short axis on image 17/2 and previously measured 2.7 cm in the short axis. Prominent nodal tissue along the right side of the mediastinum on image 38/2 measures 3.2 cm in the short axis and previously measured 2.9 cm. Index left axillary lymph node on image 36/2 measures 2.6 cm in the short axis previously measured 2.0 cm. This new or increased soft tissue along the posterior mediastinum and paraspinal region on image 88/2 that measures 2.2 cm in the short axis. There is also soft tissue along the right side  of the posterior mediastinum and paraspinal region. Enlarged lymph nodes in the cardiophrenic fat. Enlarged right internal mammary lymph nodes. Lungs/Pleura: Trachea and mainstem bronchi are patent. Trace left pleural fluid with new pleural-based thickening particularly along the paraspinal regions. Volume loss in the posterior lower lobes particularly on the left side. Subtle ground-glass opacities along the anterior right lung adjacent to the right internal mammary chain are nonspecific but favor atelectasis. No suspicious pulmonary nodules. Upper Abdomen: Extensive lymphadenopathy in the upper abdomen. Spleen appears to be enlarged. Musculoskeletal: No acute bone abnormality. No suspicious osseous lesions. IMPRESSION: 1. Progression of the lymph node enlargement throughout the chest compared to the PET-CT from 03/05/2023. Diffuse lymphadenopathy throughout the chest as described. 2. Markedly increased nodal tissue and soft tissue thickening in the posterior mediastinum and paraspinal regions. 3.  Volume loss in the posterior lower lobes particularly on the left side. Trace left pleural fluid. 4.  Aortic Atherosclerosis (ICD10-I70.0). Electronically Signed   By: Juliene Balder M.D.   On: 08/07/2023 09:28   MR LUMBAR SPINE WO CONTRAST Result Date: 08/06/2023 CLINICAL DATA:  Myelopathy, acute, lumbar spine. EXAM: MRI LUMBAR SPINE WITHOUT CONTRAST TECHNIQUE: Multiplanar, multisequence MR imaging of the lumbar spine was performed. No intravenous contrast was administered. COMPARISON:  None Available. FINDINGS: Segmentation:  Standard. Alignment:  No substantial sagittal subluxation. Vertebrae: Diffusely low T1 signal throughout the bone marrow with heterogeneous signal in the sacral vertebral bodies. No evidence of acute fracture or discitis/osteomyelitis. Conus medullaris and cauda equina: Conus extends to the L1-L2 level. Conus and cauda equina appear normal. Paraspinal and other soft tissues: There appears to be intermediate extraosseous signal surrounding the vertebral bodies at multiple levels (for example see series 9, image 28 and series 9, image 20), possibly extraosseous extension of tumor but not well assessed without contrast. Please see same day CT of the abdomen/pelvis for description of retroperitoneal nodal mass and other intra-abdominal intrapelvic findings. Disc levels: T12-L1: No significant disc protrusion, foraminal stenosis, or canal stenosis. L1-L2: No significant disc protrusion, foraminal stenosis, or canal stenosis. L2-L3: No significant disc protrusion, foraminal stenosis, or canal stenosis. L3-L4: No significant disc protrusion, foraminal stenosis, or canal stenosis. L4-L5: Mild disc bulge and endplate spurring without significant stenosis. L5-S1: Question subtle intermediate signal along the posterior aspect of the L5 vertebral body which partially effaces ventral CSF. Mild disc bulging at the disc level without significant canal or foraminal stenosis. Sacral canal: Question mild  intermediate signal in the ventral canal without compressive stenosis. IMPRESSION: 1. Diffusely abnormal marrow signal, likely related to the patient's underlying lymphoma. 2. Masslike extraosseous paraspinal signal abnormality at multiple levels, and L4 and subtle, equivacal signal in the ventral canal in the lower lumbar canal and sacral canal could potentially represent extraosseous extension of tumor but is incompletely assessed without contrast. If the patient is able, consider postcontrast imaging. 3. No compressive canal or foraminal stenosis. Electronically Signed   By: Gilmore GORMAN Molt M.D.   On: 08/06/2023 19:37   CT ABDOMEN PELVIS WO CONTRAST Result Date: 08/06/2023 CLINICAL DATA:  Chronic lymphocytic leukemia. Acute abdominal pain. * Tracking Code: BO * EXAM: CT ABDOMEN AND PELVIS WITHOUT CONTRAST TECHNIQUE: Multidetector CT imaging of the abdomen and pelvis was performed following the standard protocol without IV contrast. RADIATION DOSE REDUCTION: This exam was performed according to the departmental dose-optimization program which includes automated exposure control, adjustment of the mA and/or kV according to patient size and/or use of iterative reconstruction technique. COMPARISON:  PET-CT 03/05/2023 FINDINGS: Lower chest:  Thickened paraspinal tissue in the posterior mediastinum (image 8/2). Findings consistent lymphoma. Hepatobiliary: No focal hepatic lesion. Normal gallbladder. No biliary duct dilatation. Common bile duct is normal. Pancreas: Pancreas is normal. No ductal dilatation. No pancreatic inflammation. Spleen: Spleen is mildly enlarged at 14. Cm in craniocaudad dimension Adrenals/urinary tract: Adrenal glands and kidneys are normal. The ureters and bladder normal. Stomach/Bowel: Stomach, small bowel, appendix, and cecum are normal. The colon and rectosigmoid colon are normal. Vascular/Lymphatic: Bulky retroperitoneal lymphadenopathy increased from comparison exam. Retroperitoneal  nodal mass at the level of the lower pole LEFT kidney LEFT aorta measures 7.4 cm (image 46/2) compared to 5.1 cm comparison PET-CT scan. The aorta is elevated by 19 mm by retroperitoneal adenopathy compared to 8 mm on prior. Example node ventral to the aorta measuring 33 mm on image 52 compares to 23 mm. Bulky adenopathy extends along the iliac chains. LEFT external iliac lymph node anterior to the operator space measuring 22 mm compared to 13 mm. Minimal inguinal adenopathy. Reproductive: Prostate unremarkable Other: No free fluid. Musculoskeletal: No aggressive osseous lesion. Posterior fusion of the L4 spinous processes IMPRESSION: 1. Progression of lymphoma lymphadenopathy with increase in bulky retroperitoneal nodal mass. 2. Increase in perispinal thickening in the posterior mediastinum. 3. Mild splenomegaly. 4. No bowel obstruction. Electronically Signed   By: Jackquline Boxer M.D.   On: 08/06/2023 18:20

## 2023-08-11 ENCOUNTER — Other Ambulatory Visit: Payer: Self-pay | Admitting: Hematology

## 2023-08-11 DIAGNOSIS — D61818 Other pancytopenia: Secondary | ICD-10-CM | POA: Diagnosis not present

## 2023-08-11 DIAGNOSIS — D693 Immune thrombocytopenic purpura: Secondary | ICD-10-CM | POA: Insufficient documentation

## 2023-08-11 DIAGNOSIS — N179 Acute kidney failure, unspecified: Secondary | ICD-10-CM | POA: Diagnosis not present

## 2023-08-11 DIAGNOSIS — C9110 Chronic lymphocytic leukemia of B-cell type not having achieved remission: Secondary | ICD-10-CM | POA: Diagnosis not present

## 2023-08-11 LAB — COMPREHENSIVE METABOLIC PANEL WITH GFR
ALT: 8 U/L (ref 0–44)
ALT: 8 U/L (ref 0–44)
AST: 13 U/L — ABNORMAL LOW (ref 15–41)
AST: 17 U/L (ref 15–41)
Albumin: 2.8 g/dL — ABNORMAL LOW (ref 3.5–5.0)
Albumin: 3.7 g/dL (ref 3.5–5.0)
Alkaline Phosphatase: 53 U/L (ref 38–126)
Alkaline Phosphatase: 71 U/L (ref 38–126)
Anion gap: 7 (ref 5–15)
Anion gap: 11 (ref 5–15)
BUN: 19 mg/dL (ref 8–23)
BUN: 20 mg/dL (ref 8–23)
CO2: 20 mmol/L — ABNORMAL LOW (ref 22–32)
CO2: 23 mmol/L (ref 22–32)
Calcium: 8.7 mg/dL — ABNORMAL LOW (ref 8.9–10.3)
Calcium: 9.8 mg/dL (ref 8.9–10.3)
Chloride: 107 mmol/L (ref 98–111)
Chloride: 98 mmol/L (ref 98–111)
Creatinine, Ser: 0.8 mg/dL (ref 0.61–1.24)
Creatinine, Ser: 0.97 mg/dL (ref 0.61–1.24)
GFR, Estimated: >60 mL/min (ref >60)
GFR, Estimated: >60 mL/min (ref >60)
Glucose, Bld: 120 mg/dL — ABNORMAL HIGH (ref 70–99)
Glucose, Bld: 199 mg/dL — ABNORMAL HIGH (ref 70–99)
Potassium: 3.8 mmol/L (ref 3.5–5.1)
Potassium: 4.1 mmol/L (ref 3.5–5.1)
Sodium: 134 mmol/L — ABNORMAL LOW (ref 135–145)
Sodium: 132 mmol/L — ABNORMAL LOW (ref 135–145)
Total Bilirubin: 0.8 mg/dL (ref 0.0–1.2)
Total Bilirubin: 1.4 mg/dL — ABNORMAL HIGH (ref 0.0–1.2)
Total Protein: 5.3 g/dL — ABNORMAL LOW (ref 6.5–8.1)
Total Protein: 7.3 g/dL (ref 6.5–8.1)

## 2023-08-11 LAB — CBC
HCT: 27.8 % — ABNORMAL LOW (ref 39.0–52.0)
Hemoglobin: 9.2 g/dL — ABNORMAL LOW (ref 13.0–17.0)
MCH: 31.7 pg (ref 26.0–34.0)
MCHC: 33.1 g/dL (ref 30.0–36.0)
MCV: 95.9 fL (ref 80.0–100.0)
Platelets: 40 10*3/uL — ABNORMAL LOW (ref 150–400)
RBC: 2.9 MIL/uL — ABNORMAL LOW (ref 4.22–5.81)
RDW: 16.3 % — ABNORMAL HIGH (ref 11.5–15.5)
WBC: 5.2 10*3/uL (ref 4.0–10.5)
nRBC: 0.4 % — ABNORMAL HIGH (ref 0.0–0.2)

## 2023-08-11 LAB — HIV ANTIBODY (ROUTINE TESTING W REFLEX): HIV Screen 4th Generation wRfx: NONREACTIVE

## 2023-08-11 LAB — GLUCOSE, CAPILLARY
Glucose-Capillary: 130 mg/dL — ABNORMAL HIGH (ref 70–99)
Glucose-Capillary: 187 mg/dL — ABNORMAL HIGH (ref 70–99)
Glucose-Capillary: 231 mg/dL — ABNORMAL HIGH (ref 70–99)
Glucose-Capillary: 341 mg/dL — ABNORMAL HIGH (ref 70–99)

## 2023-08-11 LAB — ABO/RH: ABO/RH(D): A POS

## 2023-08-11 LAB — LACTATE DEHYDROGENASE: LDH: 237 U/L — ABNORMAL HIGH (ref 98–192)

## 2023-08-11 LAB — SURGICAL PATHOLOGY

## 2023-08-11 LAB — PREPARE RBC (CROSSMATCH)

## 2023-08-11 LAB — URIC ACID: Uric Acid, Serum: 2.7 mg/dL — ABNORMAL LOW (ref 3.7–8.6)

## 2023-08-11 MED ORDER — SODIUM CHLORIDE 0.9% IV SOLUTION: Freq: Once | INTRAVENOUS | Status: AC

## 2023-08-11 MED ORDER — ALLOPURINOL 300 MG PO TABS: 150.0000 mg | ORAL_TABLET | Freq: Every day | ORAL | 3 refills | Status: DC

## 2023-08-11 MED ORDER — PREDNISONE 50 MG PO TABS
60.0000 mg | ORAL_TABLET | Freq: Every day | ORAL | Status: AC
  Administered 2023-08-11 – 2023-08-15 (×5): 60 mg via ORAL
  Filled 2023-08-11 (×5): qty 1

## 2023-08-11 MED ORDER — PROCHLORPERAZINE MALEATE 10 MG PO TABS: 10.0000 mg | ORAL_TABLET | Freq: Four times a day (QID) | ORAL | 1 refills | Status: DC | PRN

## 2023-08-11 MED ORDER — ONDANSETRON HCL 8 MG PO TABS: 8.0000 mg | ORAL_TABLET | Freq: Three times a day (TID) | ORAL | 1 refills | Status: DC | PRN

## 2023-08-11 MED ORDER — ACYCLOVIR 400 MG PO TABS: 400.0000 mg | ORAL_TABLET | Freq: Every day | ORAL | 3 refills | Status: DC

## 2023-08-11 MED ORDER — DEXAMETHASONE 4 MG PO TABS: 8.0000 mg | ORAL_TABLET | Freq: Every day | ORAL | 1 refills | Status: DC

## 2023-08-11 MED ORDER — BISACODYL 10 MG RE SUPP
10.0000 mg | Freq: Once | RECTAL | Status: AC
  Administered 2023-08-11: 10 mg via RECTAL
  Filled 2023-08-11: qty 1

## 2023-08-11 MED ORDER — IMMUNE GLOBULIN (HUMAN) 5 GM/50ML IV SOLN
INTRAVENOUS | Status: DC
  Filled 2023-08-11 (×2): qty 650

## 2023-08-11 NOTE — Plan of Care (Signed)

## 2023-08-11 NOTE — Progress Notes (Signed)
 DISCONTINUE ON PATHWAY REGIMEN - Lymphoma and CLL     Cycle 1: A cycle is 28 days:     Venetoclax       Obinutuzumab       Obinutuzumab       Obinutuzumab     Cycle 2: A cycle is 28 days:     Venetoclax       Venetoclax       Venetoclax       Venetoclax       Obinutuzumab     Cycles 3 through 6: A cycle is 28 days:     Venetoclax       Obinutuzumab     Cycles 7 through 12: A cycle is 28 days:     Venetoclax    **Always confirm dose/schedule in your pharmacy ordering system**  PRIOR TREATMENT: [id number]: Venetoclax  Daily (with dose ramp-up, starting D22 Cycle 1 - Cycle 12) + Obinutuzumab  (Cycles 1-6) q28 Days  START OFF PATHWAY REGIMEN - Lymphoma and CLL   [id number]:Bendamustine  70 mg/m2 IV D1,2 + Rituximab  375/500 mg/m2 IV D1 q28 Days x 6 Cycles:   Cycle 1: A cycle is 28 days:     Rituximab -xxxx      Bendamustine     Cycles 2 through 6: A cycle is every 28 days:     Rituximab -xxxx      Bendamustine    **Always confirm dose/schedule in your pharmacy ordering system**  Patient Characteristics: Chronic Lymphocytic Leukemia (CLL), Treatment Indicated, Third Line and Beyond, Prior Covalent BTK Inhibitor and Venetoclax  Disease Type: Chronic Lymphocytic Leukemia (CLL) Disease Type: Not Applicable Disease Type: Not Applicable Treatment Indicated<= Treatment Indicated Line of Therapy: Third Line and Beyond Intent of Therapy: Non-Curative / Palliative Intent, Discussed with Patient

## 2023-08-11 NOTE — TOC Progression Note (Signed)
 Transition of Care [facility]) - Progression Note    Patient Details  Name: CHAUN UEMURA MRN: [id number] Date of Birth: 1955/06/06  [facility] CM/SW Contact  Doneta Glenys DASEN, RN Phone Number: 08/11/2023, 3:22 PM  Clinical Narrative:    Patient denied for CIR. Faxed out [facility] Checklist documents.   Expected Discharge Plan: Home w Home Health Services Barriers to Discharge: Continued Medical Work up  Expected Discharge Plan and Services In-house Referral: NA Discharge Planning Services: CM Consult   Living arrangements for the past 2 months: Single Family Home                   DME Agency: NA       HH Arranged: NA HH Agency: NA         Social Determinants of Health (SDOH) Interventions SDOH Screenings   Food Insecurity: No Food Insecurity (08/06/2023)  Housing: Low Risk  (08/10/2023)  Transportation Needs: No Transportation Needs (08/06/2023)  Utilities: Not At Risk (08/06/2023)  Social Connections: Socially Isolated (08/06/2023)  Tobacco Use: Low Risk  (08/06/2023)    Readmission Risk Interventions    08/07/2023    8:49 AM  Readmission Risk Prevention Plan  Transportation Screening Complete  PCP or Specialist Appt within 5-7 Days Complete  Home Care Screening Complete  Medication Review (RN CM) Complete

## 2023-08-11 NOTE — Progress Notes (Addendum)
 Tyler Scott   DOB:[birth year]   FM#:[id number]      ASSESSMENT & PLAN:  Tyler Scott is a 68 year old male patient with oncologic history significant for CLL. He was admitted on 08/06/23 with complaints of progressive low back pain, unable to walk x3 days.     CLL/SLL Low grade Lymphoma -new dx Lymphadenopathy -- Diagnosed CLL in 2012 -- status post several previous therapies including Bendamustine  and rituximab , Ibrutinib , and most recently on Calquence  since 2023.  -- Patient was supposed to be on Gazyva , venetoclax . He had many interruped cycles of Gazyva  due to No shows.  He was last seen in outpatient oncology on 07/06/23 at which time he was scheduled for C4D1 Gazyva  however was HELD due to infection/acute left sided epidydimo-orchitis for which he was on antibiotics.  -- Imaging done 6/27 shows progressive lymphadenopathy, may be CLL progression vs large cell transformation. -- Biopsy of RP LN 6/30, prelim path shows Low grade Lymphoma consistent with CLL/SLL.   -- Initiate prednisone  60 mg po x5 days to start today, then taper dose. - IVIG 1g/kg daily x 2 ordered, to be initiated today. -- Likely will restart Gazyva  + venetoclax .  Further treatment will be planned by Dr. Onesimo. -- Medical Oncology/Dr. Onesimo following closely.   Pancytopenia: -- Secondary to malignancy  Leukopenia --WBC low 2.5. No GCS-F recommended.  Anemia --Hemoglobin low 6.5.  Recommend PRBC transfusion for HGB less than 7.0. Noted transfusion ongoing.  -- Ordered hemolysis labs; pending haptoglobin, LDH, uric acid.  Thrombocytopenia --Platelets low 27K.  Recommend transfusion for counts <20K or <50K with bleeding. --Continue to monitor CBC with differential --IVIG 1g/kg x 2 doses to address possible ITP from CLL.  AKI -- Renal function improving -- Continue to monitor CMP    Code Status Full  Subjective:  Patient seen awake and alert sitting in chair at bedside.  Reports that he  feels the same does not feel better or worse.  Denies chest pain or other acute symptoms.  No other acute complaints offered.  Objective:   Intake/Output Summary (Last 24 hours) at 08/11/2023 1146 Last data filed at 08/11/2023 0412 Gross per 24 hour  Intake 230.91 ml  Output 550 ml  Net -319.09 ml     PHYSICAL EXAMINATION: ECOG PERFORMANCE STATUS: 3 - Symptomatic, >50% confined to bed  Vitals:   08/11/23 1013 08/11/23 1042  BP: (!) 124/58 134/70  Pulse: 99 (!) 101  Resp: 18   Temp: 98 F (36.7 C) 98.4 F (36.9 C)  SpO2:  100%   Filed Weights   08/06/23 1506  Weight: 145 lb (65.8 kg)    GENERAL: alert, +ill-appearing SKIN: + Pale skin color, texture, turgor are normal, no rashes or significant lesions EYES: normal, conjunctiva are pink and non-injected, sclera clear OROPHARYNX: no exudate, no erythema and lips, buccal mucosa, and tongue normal  NECK: supple, thyroid  normal size, non-tender, without nodularity LYMPH: no palpable lymphadenopathy in the cervical, axillary or inguinal LUNGS: clear to auscultation and percussion with normal breathing effort HEART: regular rate & rhythm and no murmurs and no lower extremity edema ABDOMEN: abdomen soft, non-tender and normal bowel sounds MUSCULOSKELETAL: no cyanosis of digits and no clubbing  PSYCH: alert & oriented x 3 with fluent speech NEURO: no focal motor/sensory deficits   All questions were answered. The patient knows to call the clinic with any problems, questions or concerns.   The total time spent in the appointment was 40 minutes encounter with patient  including review of chart and various tests results, discussions about plan of care and coordination of care plan  [name], NP 08/11/2023 11:46 AM    Labs Reviewed:  Lab Results  Component Value Date   WBC 2.5 (L) 08/11/2023   HGB 6.5 (LL) 08/11/2023   HCT 20.2 (L) 08/11/2023   MCV 99.0 08/11/2023   PLT 27 (LL) 08/11/2023   Recent Labs    08/09/23 0458  08/10/23 0450 08/11/23 0444  NA 133* 128* 134*  K 4.2 4.3 3.8  CL 103 98 107  CO2 22 21* 20*  GLUCOSE 152* 157* 120*  BUN 28* 23 19  CREATININE 1.03 0.94 0.80  CALCIUM 9.2 9.3 8.7*  GFRNONAA >60 >60 >60  PROT 5.9* 6.0* 5.3*  ALBUMIN 3.2* 3.2* 2.8*  AST 14* 15 13*  ALT 8 8 8   ALKPHOS 63 61 53  BILITOT 1.0 1.2 0.8    Studies Reviewed:  CT BIOPSY Result Date: 08/09/2023 CLINICAL DATA:  Progressive retroperitoneal adenopathy, history of CLL EXAM: CT GUIDED CORE BIOPSY OF LEFT PARA-AORTIC ADENOPATHY ANESTHESIA/SEDATION: Intravenous Fentanyl 200mcg and Versed 4mg  were administered by RN during a total moderate (conscious) sedation time of 25 minutes; the patient's level of consciousness and physiological / cardiorespiratory status were monitored continuously by radiology RN under my direct supervision. PROCEDURE: The procedure risks, benefits, and alternatives were explained to the patient. Questions regarding the procedure were encouraged and answered. The patient understands and consents to the procedure. patient placed prone. select axial scans through the abdomen obtained, adenopathy localized, and an appropriate skin entry site determined and marked. The operative field was prepped with chlorhexidinein a sterile fashion, and a sterile drape was applied covering the operative field. A sterile gown and sterile gloves were used for the procedure. Local anesthesia was provided with 1% Lidocaine. Under CT fluoroscopic guidance, a 17 gauge trocar needle was advanced to the margin of the lesion. Once needle tip position was confirmed, coaxial 18-gauge core biopsy samples were obtained, submitted in saline to surgical pathology. The guide needle was removed. Postprocedure scans show no hemorrhage or other apparent complication. The patient tolerated the procedure well. RADIATION DOSE REDUCTION: This exam was performed according to the departmental dose-optimization program which includes automated  exposure control, adjustment of the mA and/or kV according to patient size and/or use of iterative reconstruction technique. COMPLICATIONS: None immediate FINDINGS: Bulky left para-aortic and aortocaval adenopathy localized. Representative core biopsy samples obtained as above. IMPRESSION: 1. Technically successful CT-guided core biopsy, left para-aortic retroperitoneal adenopathy. Electronically Signed   By: JONETTA Faes M.D.   On: 08/09/2023 15:52   ECHOCARDIOGRAM COMPLETE Result Date: 08/07/2023    ECHOCARDIOGRAM REPORT   Patient Name:   HEBERTO STURDEVANT Date of Exam: 08/07/2023 Medical Rec #:  [id number]           Height:       68.0 in Accession #:    [id number]          Weight:       145.0 lb Date of Birth:  July 16, 1955          BSA:          1.783 m Patient Age:    67 years            BP:           136/70 mmHg Patient Gender: M                   HR:  100 bpm. Exam Location:  Inpatient Procedure: 2D Echo, Cardiac Doppler and Color Doppler (Both Spectral and Color            Flow Doppler were utilized during procedure). Indications:    Chemo  History:        Patient has no prior history of Echocardiogram examinations.                 Risk Factors:Diabetes.  Sonographer:    Benard Stallion Referring Phys: [id number] PETER R ENNEVER IMPRESSIONS  1. Left ventricular ejection fraction, by estimation, is 60 to 65%. The left ventricle has normal function. The left ventricle has no regional wall motion abnormalities. There is mild left ventricular hypertrophy. Left ventricular diastolic parameters are consistent with Grade I diastolic dysfunction (impaired relaxation).  2. Right ventricular systolic function is normal. The right ventricular size is normal.  3. The mitral valve is normal in structure. No evidence of mitral valve regurgitation. No evidence of mitral stenosis.  4. The tricuspid valve is abnormal. Tricuspid valve regurgitation is mild to moderate.  5. The aortic valve is tricuspid. Aortic valve  regurgitation is not visualized. No aortic stenosis is present. FINDINGS  Left Ventricle: Left ventricular ejection fraction, by estimation, is 60 to 65%. The left ventricle has normal function. The left ventricle has no regional wall motion abnormalities. The left ventricular internal cavity size was normal in size. There is  mild left ventricular hypertrophy. Left ventricular diastolic parameters are consistent with Grade I diastolic dysfunction (impaired relaxation). Normal left ventricular filling pressure. Right Ventricle: IVC not visualized, not able to estimate PASP. The right ventricular size is normal. Right vetricular wall thickness was not well visualized. Right ventricular systolic function is normal. Left Atrium: Left atrial size was normal in size. Right Atrium: Right atrial size was normal in size. Pericardium: There is no evidence of pericardial effusion. Mitral Valve: The mitral valve is normal in structure. There is mild thickening of the mitral valve leaflet(s). There is mild calcification of the mitral valve leaflet(s). Mild mitral annular calcification. No evidence of mitral valve regurgitation. No evidence of mitral valve stenosis. Tricuspid Valve: The tricuspid valve is abnormal. Tricuspid valve regurgitation is mild to moderate. No evidence of tricuspid stenosis. Aortic Valve: The aortic valve is tricuspid. Aortic valve regurgitation is not visualized. No aortic stenosis is present. Aortic valve mean gradient measures 4.0 mmHg. Aortic valve peak gradient measures 7.8 mmHg. Aortic valve area, by VTI measures 3.18 cm. Pulmonic Valve: The pulmonic valve was not well visualized. Pulmonic valve regurgitation is not visualized. No evidence of pulmonic stenosis. Aorta: The aortic root and ascending aorta are structurally normal, with no evidence of dilitation. IAS/Shunts: The interatrial septum was not well visualized.  LEFT VENTRICLE PLAX 2D LVIDd:         4.20 cm   Diastology LVIDs:         2.60  cm   LV e' medial:    7.29 cm/s LV PW:         1.10 cm   LV E/e' medial:  12.9 LV IVS:        1.10 cm   LV e' lateral:   7.51 cm/s LVOT diam:     2.20 cm   LV E/e' lateral: 12.5 LV SV:         74 LV SV Index:   41 LVOT Area:     3.80 cm  RIGHT VENTRICLE RV Basal diam:  3.55 cm RV Mid diam:  3.30 cm RV S prime:     14.40 cm/s TAPSE (M-mode): 3.0 cm LEFT ATRIUM             Index        RIGHT ATRIUM           Index LA diam:        3.20 cm 1.80 cm/m   RA Area:     13.40 cm LA Vol (A2C):   52.1 ml 29.23 ml/m  RA Volume:   34.40 ml  19.30 ml/m LA Vol (A4C):   28.9 ml 16.21 ml/m LA Biplane Vol: 38.6 ml 21.65 ml/m  AORTIC VALVE AV Area (Vmax):    3.18 cm AV Area (Vmean):   3.22 cm AV Area (VTI):     3.18 cm AV Vmax:           140.00 cm/s AV Vmean:          93.700 cm/s AV VTI:            0.232 m AV Peak Grad:      7.8 mmHg AV Mean Grad:      4.0 mmHg LVOT Vmax:         117.00 cm/s LVOT Vmean:        79.300 cm/s LVOT VTI:          0.194 m LVOT/AV VTI ratio: 0.84  AORTA Ao Root diam: 3.40 cm Ao Asc diam:  3.00 cm MITRAL VALVE                TRICUSPID VALVE MV Area (PHT): 3.99 cm     TR Peak grad:   48.2 mmHg MV Decel Time: 190 msec     TR Vmax:        347.00 cm/s MV E velocity: 93.80 cm/s MV A velocity: 119.00 cm/s  SHUNTS MV E/A ratio:  0.79         Systemic VTI:  0.19 m                             Systemic Diam: 2.20 cm Dorn Ross MD Electronically signed by Dorn Ross MD Signature Date/Time: 08/07/2023/12:33:53 PM    Final    CT CHEST WO CONTRAST Result Date: 08/07/2023 CLINICAL DATA:  History of CLL.  Hematologic malignancy staging. EXAM: CT CHEST WITHOUT CONTRAST TECHNIQUE: Multidetector CT imaging of the chest was performed following the standard protocol without IV contrast. RADIATION DOSE REDUCTION: This exam was performed according to the departmental dose-optimization program which includes automated exposure control, adjustment of the mA and/or kV according to patient size and/or use of  iterative reconstruction technique. COMPARISON:  CT abdomen and pelvis 08/06/2023.  PET-CT 03/05/2023 FINDINGS: Cardiovascular: Heart size is normal without significant pericardial fluid. Coronary artery calcifications. Atherosclerotic calcifications in thoracic aorta without aneurysm. Mediastinum/Nodes: Compared to the PET-CT from 03/05/2023, there has been enlargement lymph nodes throughout the chest. Nodal tissue in the left supraclavicular area adjacent to the left thyroid  lobe measures 3.2 cm in the short axis on image 17/2 and previously measured 2.7 cm in the short axis. Prominent nodal tissue along the right side of the mediastinum on image 38/2 measures 3.2 cm in the short axis and previously measured 2.9 cm. Index left axillary lymph node on image 36/2 measures 2.6 cm in the short axis previously measured 2.0 cm. This new or increased soft tissue along the posterior mediastinum and paraspinal region on image 88/2 that measures  2.2 cm in the short axis. There is also soft tissue along the right side of the posterior mediastinum and paraspinal region. Enlarged lymph nodes in the cardiophrenic fat. Enlarged right internal mammary lymph nodes. Lungs/Pleura: Trachea and mainstem bronchi are patent. Trace left pleural fluid with new pleural-based thickening particularly along the paraspinal regions. Volume loss in the posterior lower lobes particularly on the left side. Subtle ground-glass opacities along the anterior right lung adjacent to the right internal mammary chain are nonspecific but favor atelectasis. No suspicious pulmonary nodules. Upper Abdomen: Extensive lymphadenopathy in the upper abdomen. Spleen appears to be enlarged. Musculoskeletal: No acute bone abnormality. No suspicious osseous lesions. IMPRESSION: 1. Progression of the lymph node enlargement throughout the chest compared to the PET-CT from 03/05/2023. Diffuse lymphadenopathy throughout the chest as described. 2. Markedly increased nodal  tissue and soft tissue thickening in the posterior mediastinum and paraspinal regions. 3. Volume loss in the posterior lower lobes particularly on the left side. Trace left pleural fluid. 4.  Aortic Atherosclerosis (ICD10-I70.0). Electronically Signed   By: Juliene Balder M.D.   On: 08/07/2023 09:28   MR LUMBAR SPINE WO CONTRAST Result Date: 08/06/2023 CLINICAL DATA:  Myelopathy, acute, lumbar spine. EXAM: MRI LUMBAR SPINE WITHOUT CONTRAST TECHNIQUE: Multiplanar, multisequence MR imaging of the lumbar spine was performed. No intravenous contrast was administered. COMPARISON:  None Available. FINDINGS: Segmentation:  Standard. Alignment:  No substantial sagittal subluxation. Vertebrae: Diffusely low T1 signal throughout the bone marrow with heterogeneous signal in the sacral vertebral bodies. No evidence of acute fracture or discitis/osteomyelitis. Conus medullaris and cauda equina: Conus extends to the L1-L2 level. Conus and cauda equina appear normal. Paraspinal and other soft tissues: There appears to be intermediate extraosseous signal surrounding the vertebral bodies at multiple levels (for example see series 9, image 28 and series 9, image 20), possibly extraosseous extension of tumor but not well assessed without contrast. Please see same day CT of the abdomen/pelvis for description of retroperitoneal nodal mass and other intra-abdominal intrapelvic findings. Disc levels: T12-L1: No significant disc protrusion, foraminal stenosis, or canal stenosis. L1-L2: No significant disc protrusion, foraminal stenosis, or canal stenosis. L2-L3: No significant disc protrusion, foraminal stenosis, or canal stenosis. L3-L4: No significant disc protrusion, foraminal stenosis, or canal stenosis. L4-L5: Mild disc bulge and endplate spurring without significant stenosis. L5-S1: Question subtle intermediate signal along the posterior aspect of the L5 vertebral body which partially effaces ventral CSF. Mild disc bulging at the disc  level without significant canal or foraminal stenosis. Sacral canal: Question mild intermediate signal in the ventral canal without compressive stenosis. IMPRESSION: 1. Diffusely abnormal marrow signal, likely related to the patient's underlying lymphoma. 2. Masslike extraosseous paraspinal signal abnormality at multiple levels, and L4 and subtle, equivacal signal in the ventral canal in the lower lumbar canal and sacral canal could potentially represent extraosseous extension of tumor but is incompletely assessed without contrast. If the patient is able, consider postcontrast imaging. 3. No compressive canal or foraminal stenosis. Electronically Signed   By: Gilmore GORMAN Molt M.D.   On: 08/06/2023 19:37   CT ABDOMEN PELVIS WO CONTRAST Result Date: 08/06/2023 CLINICAL DATA:  Chronic lymphocytic leukemia. Acute abdominal pain. * Tracking Code: BO * EXAM: CT ABDOMEN AND PELVIS WITHOUT CONTRAST TECHNIQUE: Multidetector CT imaging of the abdomen and pelvis was performed following the standard protocol without IV contrast. RADIATION DOSE REDUCTION: This exam was performed according to the departmental dose-optimization program which includes automated exposure control, adjustment of the mA and/or kV according to  patient size and/or use of iterative reconstruction technique. COMPARISON:  PET-CT 03/05/2023 FINDINGS: Lower chest: Thickened paraspinal tissue in the posterior mediastinum (image 8/2). Findings consistent lymphoma. Hepatobiliary: No focal hepatic lesion. Normal gallbladder. No biliary duct dilatation. Common bile duct is normal. Pancreas: Pancreas is normal. No ductal dilatation. No pancreatic inflammation. Spleen: Spleen is mildly enlarged at 14. Cm in craniocaudad dimension Adrenals/urinary tract: Adrenal glands and kidneys are normal. The ureters and bladder normal. Stomach/Bowel: Stomach, small bowel, appendix, and cecum are normal. The colon and rectosigmoid colon are normal. Vascular/Lymphatic: Bulky  retroperitoneal lymphadenopathy increased from comparison exam. Retroperitoneal nodal mass at the level of the lower pole LEFT kidney LEFT aorta measures 7.4 cm (image 46/2) compared to 5.1 cm comparison PET-CT scan. The aorta is elevated by 19 mm by retroperitoneal adenopathy compared to 8 mm on prior. Example node ventral to the aorta measuring 33 mm on image 52 compares to 23 mm. Bulky adenopathy extends along the iliac chains. LEFT external iliac lymph node anterior to the operator space measuring 22 mm compared to 13 mm. Minimal inguinal adenopathy. Reproductive: Prostate unremarkable Other: No free fluid. Musculoskeletal: No aggressive osseous lesion. Posterior fusion of the L4 spinous processes IMPRESSION: 1. Progression of lymphoma lymphadenopathy with increase in bulky retroperitoneal nodal mass. 2. Increase in perispinal thickening in the posterior mediastinum. 3. Mild splenomegaly. 4. No bowel obstruction. Electronically Signed   By: Jackquline Boxer M.D.   On: 08/06/2023 18:20    ADDENDUM  .Patient was Personally and independently interviewed, examined and relevant elements of the history of present illness were reviewed in details and an assessment and plan was created. All elements of the patient's history of present illness , assessment and plan were discussed in details with Lisa Rouson NP. The above documentation reflects our combined findings assessment and plan.   .    Latest Ref Rng & Units 08/11/2023    2:19 PM 08/11/2023    4:44 AM 08/10/2023    4:50 AM  CBC  WBC 4.0 - 10.5 K/uL 5.2  2.5  3.0   Hemoglobin 13.0 - 17.0 g/dL 9.2  6.5  7.7   Hematocrit 39.0 - 52.0 % 27.8  20.2  23.7   Platelets 150 - 400 K/uL 40  27  29    .    Latest Ref Rng & Units 08/11/2023    2:19 PM 08/11/2023    4:44 AM 08/10/2023    4:50 AM  CMP  Glucose 70 - 99 mg/dL 800  879  842   BUN 8 - 23 mg/dL 20  19  23    Creatinine 0.61 - 1.24 mg/dL 9.02  9.19  9.05   Sodium 135 - 145 mmol/L 132  134  128    Potassium 3.5 - 5.1 mmol/L 4.1  3.8  4.3   Chloride 98 - 111 mmol/L 98  107  98   CO2 22 - 32 mmol/L 23  20  21    Calcium 8.9 - 10.3 mg/dL 9.8  8.7  9.3   Total Protein 6.5 - 8.1 g/dL 7.3  5.3  6.0   Total Bilirubin 0.0 - 1.2 mg/dL 1.4  0.8  1.2   Alkaline Phos 38 - 126 U/L 71  53  61   AST 15 - 41 U/L 17  13  15    ALT 0 - 44 U/L 8  8  8     . Lab Results  Component Value Date   LDH 237 (H) 08/11/2023   Uric  acid 2.7  SURGICAL PATHOLOGY  CASE: [id number]  PATIENT: TAFT BLUNT  Surgical Pathology Report  Clinical History: L paraaortic LAN, CLL(las)  FINAL MICROSCOPIC DIAGNOSIS:   A. LYMPH NODE, LEFT PARAAORTIC, NEEDLE CORE BIOPSY:  -  Chronic lymphocytic leukemia/small lymphocytic lymphoma   Emaline Saran MD MS    ASSESSMENT  1) SYmptomatic progression of CLL/SLL with progressive LNadenoapthy in inguinal area (Painful but improved with steroids) + bulky retroperitoneal LNadenopathy + splenomegaly and mediastinal disease  2) Anemia /thrombocytopenia/leucopenia -- primarily related to CLL/SLL with likely BM involvement. No overt hemolysis Thrombocytopenia could have element of ITP  PLAN - patient has previosusly responded well to 2 cycles of BR in 2017. Has progressed on BTK inhibitors and was started on Gazyva  but had significant breaks in treatment due to no shows and epidydimo-Orchitis (now resolved), had not had a chance to start Venetoclax . - patient now has symptomatic inguinal and retroperitoneal LNadenopathy and prefers a different treatment that could potentially produce faster response -discussed LN Bx results -- no evidence of transformation to large cell lymphoma. -started on Prednisone  @ 60mg  with plan to taper over 2 weeks to help with symptoms from [facility] and potentially help with ANemia and thrombocytopenia. -IVIG 1g/kg x 2 doses to potentially help with thrombocytopenia that could have an ITP etiology related to CLL -will plan to pursue  Bendamustine /Rituxan  as outpatient in 1-2 weeks with plan to do 3-4 cycles if tolerated and transitioning back to Venetoclax  + Gazyva  or Rituxan . -will need weekly labs to monitor for transfusion needs. -might need to consider weekly Nplate if thrombocytopenia persists . -plan discussed in details with patient and wife. -might need SNF on discharge due to fatigue/weakness.  Emaline Saran MD MS .The total time spent in the appointment was 50 minutes* .  All of the patient's questions were answered with apparent satisfaction. The patient knows to call the clinic with any problems, questions or concerns.   Emaline Saran MD MS AAHIVMS [facility] Hematology/Oncology Physician [facility]  .*Total Encounter Time as defined by the Centers for Medicare and Medicaid Services includes, in addition to the face-to-face time of a patient visit (documented in the note above) non-face-to-face time: obtaining and reviewing outside history, ordering and reviewing medications, tests or procedures, care coordination (communications with other health care professionals or caregivers) and documentation in the medical record.

## 2023-08-11 NOTE — Progress Notes (Signed)
 [facility] IP rehab admissions - I spoke with patient's wife by phone today.  Wife works a lot and does not have anyone who can stay with patient after a potential CIR stay.  Wife prefers SNF placement for patient.  Agree with need for SNF since wife works and patient has no other caregiver support.  I will sign off for CIR at this time.  [phone]

## 2023-08-11 NOTE — Progress Notes (Signed)
 PROGRESS NOTE    Tyler Scott  FMW:[id number] DOB: [birth year] DOA: 08/06/2023 PCP: [name], MD   Brief Narrative:  Patient is a 68 year old male with history of insulin -dependent type type II, hypertension, CLL who presented with a complaint of progressive low back pain, not able to walk, decreased oral intake.  Lab work on presentation showed pancytopenia.  CT abdomen/pelvis showed  Progression of lymphoma lymphadenopathy with increase in bulky retroperitoneal nodal mass.  MRI of the lumbar spine showed Masslike extraosseous paraspinal signal abnormality at multiple levels.  Oncology consulted and following.  S/P  lymph node biopsy  Assessment & Plan:   Principal Problem:   AKI (acute kidney injury) (HCC) Active Problems:   CLL (chronic lymphocytic leukemia) (HCC)   Insulin  dependent type 2 diabetes mellitus (HCC)   Pancytopenia (HCC)  CLL/pancytopenia: Presented with low back pain.CT abdomen/pelvis showed  progression of lymphoma lymphadenopathy with increase in bulky retroperitoneal nodal mass.  MRI of the lumbar spine showed masslike extraosseous paraspinal signal abnormality at multiple levels.  History of CLL, was on different therapies.  Has adenopathy in the inguinal area.  Oncology suspecting transformation of CLL to aggressive lymphoma.  Suspicion of bone marrow involvement.  Given rasburicase  for high uric acid  level.  Oncology eventually planning for starting chemotherapy, Port-A-Cath placement. S/P retroperitoneal LN biopsy on 6/30.  Prelim path shows low-grade lymphoma.  Oncology starting on prednisone  for 5 days and then taper.  IVIG daily x 2 ordered and to be initiated today. Hemoglobin 6.5, 1 unit of PRBC transfusion ongoing today.   AKI: No obstructive pathology as per CT abdomen/pelvis.  UA unremarkable.  Hydrated.  Renal function normalized.   Hyponatremia: Received gentle hydration with normal saline yesterday, sodium improving and 134 today.    Hypertension: Taking lisinopril  at home.  Started on amlodipine due to AKI.  Blood pressure controlled.   Insulin -dependent diabetes type 2: A1c of 5.8  Continue current insulin  regimen   Failure to thrive/generalized weakness: PT consulted, recommend CIR however, Per CIR, they spoke with patient's wife by phone and wife works a lot and does not have anyone who can stay with the patient after potential GIST and wife wants him to go to SNF.  I have consulted TOC for that.   Constipation: Still constipated, last BM 7 days ago.  Will try Dulcolax suppository.  DVT prophylaxis: SCDs Start: 08/11/23 0936 Place and maintain sequential compression device Start: 08/07/23 1058 Place and maintain sequential compression device Start: 08/07/23 1058   Code Status: Full Code  Family Communication:  None present at bedside.  Plan of care discussed with patient in length and he/she verbalized understanding and agreed with it.  Status is: Inpatient Remains inpatient appropriate because: Starting on treatment for lymphoma per oncology.  Will need to stay for 2 to 3 days.   Estimated body mass index is 22.05 kg/m as calculated from the following:   Height as of this encounter: 5' 8 (1.727 m).   Weight as of this encounter: 65.8 kg.    Nutritional Assessment: Body mass index is 22.05 kg/m.SABRA Seen by dietician.  I agree with the assessment and plan as outlined below: Nutrition Status:        . Skin Assessment: I have examined the patient's skin and I agree with the wound assessment as performed by the wound care RN as outlined below:    Consultants:  Oncology  Procedures:  As above  Antimicrobials:  Anti-infectives (From admission, onward)  Start     Dose/Rate Route Frequency Ordered Stop   08/06/23 2145  acyclovir  (ZOVIRAX ) tablet 400 mg        400 mg Oral Daily 08/06/23 2133           Subjective: Seen and examined, sitting in the recliner, appears comfortable.  Only complaint  is constipation.  No other complaint.  He is not concerned about back pain anymore.  Objective: Vitals:   08/11/23 0412 08/11/23 0955 08/11/23 1013 08/11/23 1042  BP: 138/63 (!) 124/58 (!) 124/58 134/70  Pulse: 96 100 99 (!) 101  Resp: 18  18   Temp: 99 F (37.2 C) 98 F (36.7 C) 98 F (36.7 C) 98.4 F (36.9 C)  TempSrc:  Oral Oral   SpO2: 98% 100%  100%  Weight:      Height:        Intake/Output Summary (Last 24 hours) at 08/11/2023 1202 Last data filed at 08/11/2023 0412 Gross per 24 hour  Intake --  Output 550 ml  Net -550 ml   Filed Weights   08/06/23 1506  Weight: 65.8 kg    Examination:  General exam: Appears calm and comfortable  Respiratory system: Clear to auscultation. Respiratory effort normal. Cardiovascular system: S1 & S2 heard, RRR. No JVD, murmurs, rubs, gallops or clicks. No pedal edema. Gastrointestinal system: Abdomen is nondistended, soft and nontender. No organomegaly or masses felt. Normal bowel sounds heard. Central nervous system: Alert and oriented. No focal neurological deficits. Extremities: Symmetric 5 x 5 power. Skin: No rashes, lesions or ulcers Psychiatry: Judgement and insight appear normal. Mood & affect appropriate.    Data Reviewed: I have personally reviewed following labs and imaging studies  CBC: Recent Labs  Lab 08/07/23 0043 08/08/23 0506 08/09/23 0458 08/10/23 0450 08/11/23 0444  WBC 2.1* 2.5* 2.7* 3.0* 2.5*  NEUTROABS 0.7* 0.8* 0.9* 1.5* 0.9*  HGB 8.9* 8.7* 8.0* 7.7* 6.5*  HCT 28.9* 28.0* 25.4* 23.7* 20.2*  MCV 102.8* 103.3* 100.4* 98.8 99.0  PLT 30* 30* 28* 29* 27*   Basic Metabolic Panel: Recent Labs  Lab 08/07/23 0043 08/08/23 0506 08/09/23 0458 08/10/23 0450 08/11/23 0444  NA 135 136 133* 128* 134*  K 4.4 4.2 4.2 4.3 3.8  CL 105 106 103 98 107  CO2 21* 21* 22 21* 20*  GLUCOSE 120* 104* 152* 157* 120*  BUN 40* 32* 28* 23 19  CREATININE 2.22* 1.42* 1.03 0.94 0.80  CALCIUM 9.2 9.1 9.2 9.3 8.7*    GFR: Estimated Creatinine Clearance: 83.4 mL/min (by C-G formula based on SCr of 0.8 mg/dL). Liver Function Tests: Recent Labs  Lab 08/07/23 0043 08/08/23 0506 08/09/23 0458 08/10/23 0450 08/11/23 0444  AST 20 16 14* 15 13*  ALT 10 8 8 8 8   ALKPHOS 71 65 63 61 53  BILITOT 0.9 1.1 1.0 1.2 0.8  PROT 6.6 6.1* 5.9* 6.0* 5.3*  ALBUMIN 4.0 3.4* 3.2* 3.2* 2.8*   No results for input(s): LIPASE, AMYLASE in the last 168 hours. No results for input(s): AMMONIA in the last 168 hours. Coagulation Profile: Recent Labs  Lab 08/08/23 0506  INR 1.0   Cardiac Enzymes: No results for input(s): CKTOTAL, CKMB, CKMBINDEX, TROPONINI in the last 168 hours. BNP (last 3 results) No results for input(s): PROBNP in the last 8760 hours. HbA1C: No results for input(s): HGBA1C in the last 72 hours. CBG: Recent Labs  Lab 08/10/23 1237 08/10/23 1637 08/10/23 2057 08/11/23 0749 08/11/23 1151  GLUCAP 151* 185* 142* 130* 187*  Lipid Profile: No results for input(s): CHOL, HDL, LDLCALC, TRIG, CHOLHDL, LDLDIRECT in the last 72 hours. Thyroid  Function Tests: No results for input(s): TSH, T4TOTAL, FREET4, T3FREE, THYROIDAB in the last 72 hours. Anemia Panel: No results for input(s): VITAMINB12, FOLATE, FERRITIN, TIBC, IRON, RETICCTPCT in the last 72 hours. Sepsis Labs: No results for input(s): PROCALCITON, LATICACIDVEN in the last 168 hours.  Recent Results (from the past 240 hours)  Culture, blood (Routine X 2) w Reflex to ID Panel     Status: None (Preliminary result)   Collection Time: 08/07/23 12:44 AM   Specimen: BLOOD RIGHT ARM  Result Value Ref Range Status   Specimen Description   Final    BLOOD RIGHT ARM Performed at [facility], [address] [place], [place] [address]    Special Requests   Final    BOTTLES DRAWN AEROBIC AND ANAEROBIC Blood Culture adequate volume Performed at [facility], [address], [place], [place] [address]    Culture   Final    NO GROWTH 4 DAYS Performed at [facility], [address] [place], [place] [address]    Report Status PENDING  Incomplete  Culture, blood (Routine X 2) w Reflex to ID Panel     Status: None (Preliminary result)   Collection Time: 08/07/23 12:44 AM   Specimen: BLOOD LEFT ARM  Result Value Ref Range Status   Specimen Description   Final    BLOOD LEFT ARM Performed at [facility], [address], [place], [place] [address]    Special Requests   Final    BOTTLES DRAWN AEROBIC AND ANAEROBIC Blood Culture adequate volume Performed at [facility], [address] [place], [place] [address]    Culture   Final    NO GROWTH 4 DAYS Performed at [facility], [address], [place], [place] [address]    Report Status PENDING  Incomplete     Radiology Studies: CT BIOPSY Result Date: 08/09/2023 CLINICAL DATA:  Progressive retroperitoneal adenopathy, history of CLL EXAM: CT GUIDED CORE BIOPSY OF LEFT PARA-AORTIC ADENOPATHY ANESTHESIA/SEDATION: Intravenous Fentanyl 200mcg and Versed 4mg  were administered by RN during a total moderate (conscious) sedation time of 25 minutes; the patient's level of consciousness and physiological / cardiorespiratory status were monitored continuously by radiology RN under my direct supervision. PROCEDURE: The procedure risks, benefits, and alternatives were explained to the patient. Questions regarding the procedure were encouraged and answered. The patient understands and consents to the procedure. patient placed prone. select axial scans through the abdomen obtained, adenopathy localized, and an appropriate skin entry site determined and marked. The operative field was prepped with chlorhexidinein a sterile fashion, and a sterile drape was applied covering the operative field. A sterile gown and sterile gloves were used for the procedure. Local anesthesia was provided with  1% Lidocaine. Under CT fluoroscopic guidance, a 17 gauge trocar needle was advanced to the margin of the lesion. Once needle tip position was confirmed, coaxial 18-gauge core biopsy samples were obtained, submitted in saline to surgical pathology. The guide needle was removed. Postprocedure scans show no hemorrhage or other apparent complication. The patient tolerated the procedure well. RADIATION DOSE REDUCTION: This exam was performed according to the departmental dose-optimization program which includes automated exposure control, adjustment of the mA and/or kV according to patient size and/or use of iterative reconstruction technique. COMPLICATIONS: None immediate FINDINGS: Bulky left para-aortic and aortocaval adenopathy localized. Representative core biopsy samples  obtained as above. IMPRESSION: 1. Technically successful CT-guided core biopsy, left para-aortic retroperitoneal adenopathy. Electronically Signed   By: JONETTA Faes M.D.   On: 08/09/2023 15:52    Scheduled Meds:  sodium chloride    Intravenous Once   acyclovir   400 mg Oral Daily   allopurinol   150 mg Oral Daily   amLODipine  10 mg Oral Daily   gabapentin  100 mg Oral QHS   insulin  aspart  0-9 Units Subcutaneous TID WC   insulin  glargine-yfgn  8 Units Subcutaneous Daily   lidocaine  1 patch Transdermal Q24H   polyethylene glycol  17 g Oral Daily   predniSONE   60 mg Oral Daily   senna-docusate  1 tablet Oral BID   simvastatin   20 mg Oral QHS   Continuous Infusions:  sodium chloride  75 mL/hr at 08/10/23 2112   Immune Globulin 10%       LOS: 5 days   Fredia Skeeter, MD [facility]  08/11/2023, 12:02 PM   *Please note that this is a verbal dictation therefore any spelling or grammatical errors are due to the Dragon Medical One system interpretation.  Please page via Amion and do not message via secure chat for urgent patient care matters. Secure chat can be used for non urgent patient care matters.  How to contact the  [facility] Attending or Consulting provider 7A - 7P or covering provider during after hours 7P -7A, for this patient?  Check the care team in [facility] and look for a) attending/consulting [facility] provider listed and b) the [facility] team listed. Page or secure chat 7A-7P. Log into [email or web address] and use [facility] universal password to access. If you do not have the password, please contact the hospital operator. Locate the [facility] provider you are looking for under [facility] and page to a number that you can be directly reached. If you still have difficulty reaching the provider, please page the [facility] (Director on Call) for the Hospitalists listed on amion for assistance.

## 2023-08-12 DIAGNOSIS — C8590 Non-Hodgkin lymphoma, unspecified, unspecified site: Secondary | ICD-10-CM | POA: Diagnosis not present

## 2023-08-12 DIAGNOSIS — N179 Acute kidney failure, unspecified: Secondary | ICD-10-CM | POA: Diagnosis not present

## 2023-08-12 DIAGNOSIS — C9110 Chronic lymphocytic leukemia of B-cell type not having achieved remission: Secondary | ICD-10-CM | POA: Diagnosis not present

## 2023-08-12 DIAGNOSIS — D61818 Other pancytopenia: Secondary | ICD-10-CM | POA: Diagnosis not present

## 2023-08-12 LAB — CULTURE, BLOOD (ROUTINE X 2)
Culture: NO GROWTH
Culture: NO GROWTH
Special Requests: ADEQUATE
Special Requests: ADEQUATE

## 2023-08-12 LAB — CBC WITH DIFFERENTIAL/PLATELET
Abs Immature Granulocytes: 0.09 10*3/uL — ABNORMAL HIGH (ref 0.00–0.07)
Basophils Absolute: 0 10*3/uL (ref 0.0–0.1)
Basophils Relative: 0 %
Eosinophils Absolute: 0 10*3/uL (ref 0.0–0.5)
Eosinophils Relative: 0 %
HCT: 29 % — ABNORMAL LOW (ref 39.0–52.0)
Hemoglobin: 9.7 g/dL — ABNORMAL LOW (ref 13.0–17.0)
Immature Granulocytes: 1 %
Lymphocytes Relative: 60 %
Lymphs Abs: 4.4 10*3/uL — ABNORMAL HIGH (ref 0.7–4.0)
MCH: 32.2 pg (ref 26.0–34.0)
MCHC: 33.4 g/dL (ref 30.0–36.0)
MCV: 96.3 fL (ref 80.0–100.0)
Monocytes Absolute: 0.4 10*3/uL (ref 0.1–1.0)
Monocytes Relative: 5 %
Neutro Abs: 2.5 10*3/uL (ref 1.7–7.7)
Neutrophils Relative %: 34 %
Platelets: 51 10*3/uL — ABNORMAL LOW (ref 150–400)
RBC: 3.01 MIL/uL — ABNORMAL LOW (ref 4.22–5.81)
RDW: 15.9 % — ABNORMAL HIGH (ref 11.5–15.5)
WBC: 7.4 10*3/uL (ref 4.0–10.5)
nRBC: 0.3 % — ABNORMAL HIGH (ref 0.0–0.2)

## 2023-08-12 LAB — GLUCOSE, CAPILLARY
Glucose-Capillary: 301 mg/dL — ABNORMAL HIGH (ref 70–99)
Glucose-Capillary: 385 mg/dL — ABNORMAL HIGH (ref 70–99)
Glucose-Capillary: 319 mg/dL — ABNORMAL HIGH (ref 70–99)
Glucose-Capillary: 407 mg/dL — ABNORMAL HIGH (ref 70–99)
Glucose-Capillary: 384 mg/dL — ABNORMAL HIGH (ref 70–99)

## 2023-08-12 LAB — BPAM RBC
Blood Product Expiration Date: 202507292359
ISSUE DATE / TIME: 202507021005
Unit Type and Rh: 6200

## 2023-08-12 LAB — TYPE AND SCREEN
ABO/RH(D): A POS
Antibody Screen: NEGATIVE
Unit division: 0

## 2023-08-12 LAB — HAPTOGLOBIN: Haptoglobin: 371 mg/dL — ABNORMAL HIGH (ref 32–363)

## 2023-08-12 MED ORDER — PREDNISONE 10 MG PO TABS: 10.0000 mg | ORAL_TABLET | Freq: Every day | ORAL | Status: DC

## 2023-08-12 MED ORDER — PREDNISONE 20 MG PO TABS: 20.0000 mg | ORAL_TABLET | Freq: Every day | ORAL | Status: DC

## 2023-08-12 MED ORDER — IMMUNE GLOBULIN (HUMAN) 10 GM/100ML IV SOLN
1.0000 g/kg | Freq: Once | INTRAVENOUS | Status: AC
  Administered 2023-08-12: 65 g via INTRAVENOUS
  Filled 2023-08-12: qty 650

## 2023-08-12 MED ORDER — INSULIN ASPART 100 UNIT/ML IJ SOLN
5.0000 [IU] | Freq: Once | INTRAMUSCULAR | Status: AC
  Administered 2023-08-12: 5 [IU] via SUBCUTANEOUS

## 2023-08-12 MED ORDER — PREDNISONE 50 MG PO TABS
50.0000 mg | ORAL_TABLET | Freq: Every day | ORAL | Status: AC
  Administered 2023-08-16 – 2023-08-17 (×2): 50 mg via ORAL
  Filled 2023-08-12 (×2): qty 1

## 2023-08-12 MED ORDER — PREDNISONE 20 MG PO TABS
40.0000 mg | ORAL_TABLET | Freq: Every day | ORAL | Status: DC
  Administered 2023-08-18: 40 mg via ORAL
  Filled 2023-08-12: qty 2

## 2023-08-12 MED ORDER — PREDNISONE 20 MG PO TABS: 30.0000 mg | ORAL_TABLET | Freq: Every day | ORAL | Status: DC

## 2023-08-12 MED FILL — Immune Globulin (Human) IV Soln 40 GM/400ML: INTRAVENOUS | Qty: 400 | Status: CN

## 2023-08-12 NOTE — Progress Notes (Signed)
 Tyler Scott   DOB:[birth year]   FM#:[id number]      ASSESSMENT & PLAN:  Tyler Scott is a 68 year old male patient with oncologic history significant for CLL. He was admitted on 08/06/23 with complaints of progressive low back pain, unable to walk x3 days.     CLL/SLL Low grade Lymphoma -new dx Lymphadenopathy -- Diagnosed CLL in 2012 -- status post several previous therapies including Bendamustine  and rituximab , Ibrutinib , and most recently on Calquence  since 2023.  -- Patient was supposed to be on Gazyva , venetoclax . He had many interruped cycles of Gazyva  due to No shows.  He was last seen in outpatient oncology on 07/06/23 at which time he was scheduled for C4D1 Gazyva  however was HELD due to infection/acute left sided epidydimo-orchitis for which he was on antibiotics.  -- Imaging done 6/27 shows progressive lymphadenopathy, may be CLL progression vs large cell transformation. -- Biopsy of RP LN 6/30, prelim path shows Low grade Lymphoma consistent with CLL/SLL.   -- Prednisone  60 mg po x5 days initiated 08/11/2023 with taper dosing over the next 2 weeks.  Please ensure if patient is discharged this weekend that prednisone  taper dosing continues.   -- Chemotherapy regimen with Bendamustine /rituximab  orders placed to start next week as outpatient along with weekly Nplate. - Likely for discharge to SNF - Should follow closely with hematology outpatient/Dr. Onesimo postdischarge.    Pancytopenia: -- Secondary to malignancy Leukopenia --WBC improving 7.4 today Anemia --Hemoglobin improved posttransfusion to 9.7 today.  He is status post PRBC transfusion for Hgb 6.5 on 7/2.  Thrombocytopenia --Platelets continue to improve 51K today. - Recommend transfusion for counts <20K or <50K with bleeding. --Continue to monitor CBC with differential --IVIG 1g/kg x 2 doses to address possible ITP from CLL.  Initiated 7/2, for completion today 7/3, has tolerated well.   AKI -- Renal  function normalized -- Continue to monitor CMP       Code Status Full  Subjective:  Patient awake alert and oriented x 3 sitting in chair at bedside.  States that night sweats have restarted and that his feet are slightly swollen.  Denies chest pain or other GI symptoms.  Patient made aware of upcoming outpatient chemotherapy and is agreeable.  No acute distress is noted.  Objective:   Intake/Output Summary (Last 24 hours) at 08/12/2023 1217 Last data filed at 08/12/2023 0930 Gross per 24 hour  Intake 635.63 ml  Output --  Net 635.63 ml     PHYSICAL EXAMINATION: ECOG PERFORMANCE STATUS: 2 - Symptomatic, <50% confined to bed  Vitals:   08/12/23 0600 08/12/23 1015  BP: 123/70 128/64  Pulse: 88   Resp: 18   Temp: 97.7 F (36.5 C)   SpO2: 99%    Filed Weights   08/06/23 1506  Weight: 145 lb (65.8 kg)    GENERAL: alert, + mild distress and comfortable SKIN: + Pale skin color, texture, turgor are normal, no rashes or significant lesions EYES: normal, conjunctiva are pink and non-injected, sclera clear OROPHARYNX: no exudate, no erythema and lips, buccal mucosa, and tongue normal  NECK: supple, thyroid  normal size, non-tender, without nodularity LYMPH: no palpable lymphadenopathy in the cervical, axillary or inguinal LUNGS: clear to auscultation and percussion with normal breathing effort HEART: regular rate & rhythm and no murmurs and no lower extremity edema ABDOMEN: abdomen soft, non-tender and normal bowel sounds MUSCULOSKELETAL: no cyanosis of digits and no clubbing  PSYCH: alert & oriented x 3 with fluent speech NEURO: no  focal motor/sensory deficits   All questions were answered. The patient knows to call the clinic with any problems, questions or concerns.   The total time spent in the appointment was 40 minutes encounter with patient including review of chart and various tests results, discussions about plan of care and coordination of care plan  [name],  NP 08/12/2023 12:17 PM    Labs Reviewed:  Lab Results  Component Value Date   WBC 7.4 08/12/2023   HGB 9.7 (L) 08/12/2023   HCT 29.0 (L) 08/12/2023   MCV 96.3 08/12/2023   PLT 51 (L) 08/12/2023   Recent Labs    08/10/23 0450 08/11/23 0444 08/11/23 1419  NA 128* 134* 132*  K 4.3 3.8 4.1  CL 98 107 98  CO2 21* 20* 23  GLUCOSE 157* 120* 199*  BUN 23 19 20   CREATININE 0.94 0.80 0.97  CALCIUM 9.3 8.7* 9.8  GFRNONAA >60 >60 >60  PROT 6.0* 5.3* 7.3  ALBUMIN 3.2* 2.8* 3.7  AST 15 13* 17  ALT 8 8 8   ALKPHOS 61 53 71  BILITOT 1.2 0.8 1.4*    Studies Reviewed:  CT BIOPSY Result Date: 08/09/2023 CLINICAL DATA:  Progressive retroperitoneal adenopathy, history of CLL EXAM: CT GUIDED CORE BIOPSY OF LEFT PARA-AORTIC ADENOPATHY ANESTHESIA/SEDATION: Intravenous Fentanyl 200mcg and Versed 4mg  were administered by RN during a total moderate (conscious) sedation time of 25 minutes; the patient's level of consciousness and physiological / cardiorespiratory status were monitored continuously by radiology RN under my direct supervision. PROCEDURE: The procedure risks, benefits, and alternatives were explained to the patient. Questions regarding the procedure were encouraged and answered. The patient understands and consents to the procedure. patient placed prone. select axial scans through the abdomen obtained, adenopathy localized, and an appropriate skin entry site determined and marked. The operative field was prepped with chlorhexidinein a sterile fashion, and a sterile drape was applied covering the operative field. A sterile gown and sterile gloves were used for the procedure. Local anesthesia was provided with 1% Lidocaine. Under CT fluoroscopic guidance, a 17 gauge trocar needle was advanced to the margin of the lesion. Once needle tip position was confirmed, coaxial 18-gauge core biopsy samples were obtained, submitted in saline to surgical pathology. The guide needle was removed. Postprocedure  scans show no hemorrhage or other apparent complication. The patient tolerated the procedure well. RADIATION DOSE REDUCTION: This exam was performed according to the departmental dose-optimization program which includes automated exposure control, adjustment of the mA and/or kV according to patient size and/or use of iterative reconstruction technique. COMPLICATIONS: None immediate FINDINGS: Bulky left para-aortic and aortocaval adenopathy localized. Representative core biopsy samples obtained as above. IMPRESSION: 1. Technically successful CT-guided core biopsy, left para-aortic retroperitoneal adenopathy. Electronically Signed   By: JONETTA Faes M.D.   On: 08/09/2023 15:52   ECHOCARDIOGRAM COMPLETE Result Date: 08/07/2023    ECHOCARDIOGRAM REPORT   Patient Name:   DAEMYN GARIEPY Date of Exam: 08/07/2023 Medical Rec #:  [id number]           Height:       68.0 in Accession #:    [id number]          Weight:       145.0 lb Date of Birth:  1955-07-22          BSA:          1.783 m Patient Age:    40 years  BP:           136/70 mmHg Patient Gender: M                   HR:           100 bpm. Exam Location:  Inpatient Procedure: 2D Echo, Cardiac Doppler and Color Doppler (Both Spectral and Color            Flow Doppler were utilized during procedure). Indications:    Chemo  History:        Patient has no prior history of Echocardiogram examinations.                 Risk Factors:Diabetes.  Sonographer:    Benard Stallion Referring Phys: [id number] PETER R ENNEVER IMPRESSIONS  1. Left ventricular ejection fraction, by estimation, is 60 to 65%. The left ventricle has normal function. The left ventricle has no regional wall motion abnormalities. There is mild left ventricular hypertrophy. Left ventricular diastolic parameters are consistent with Grade I diastolic dysfunction (impaired relaxation).  2. Right ventricular systolic function is normal. The right ventricular size is normal.  3. The mitral valve is normal  in structure. No evidence of mitral valve regurgitation. No evidence of mitral stenosis.  4. The tricuspid valve is abnormal. Tricuspid valve regurgitation is mild to moderate.  5. The aortic valve is tricuspid. Aortic valve regurgitation is not visualized. No aortic stenosis is present. FINDINGS  Left Ventricle: Left ventricular ejection fraction, by estimation, is 60 to 65%. The left ventricle has normal function. The left ventricle has no regional wall motion abnormalities. The left ventricular internal cavity size was normal in size. There is  mild left ventricular hypertrophy. Left ventricular diastolic parameters are consistent with Grade I diastolic dysfunction (impaired relaxation). Normal left ventricular filling pressure. Right Ventricle: IVC not visualized, not able to estimate PASP. The right ventricular size is normal. Right vetricular wall thickness was not well visualized. Right ventricular systolic function is normal. Left Atrium: Left atrial size was normal in size. Right Atrium: Right atrial size was normal in size. Pericardium: There is no evidence of pericardial effusion. Mitral Valve: The mitral valve is normal in structure. There is mild thickening of the mitral valve leaflet(s). There is mild calcification of the mitral valve leaflet(s). Mild mitral annular calcification. No evidence of mitral valve regurgitation. No evidence of mitral valve stenosis. Tricuspid Valve: The tricuspid valve is abnormal. Tricuspid valve regurgitation is mild to moderate. No evidence of tricuspid stenosis. Aortic Valve: The aortic valve is tricuspid. Aortic valve regurgitation is not visualized. No aortic stenosis is present. Aortic valve mean gradient measures 4.0 mmHg. Aortic valve peak gradient measures 7.8 mmHg. Aortic valve area, by VTI measures 3.18 cm. Pulmonic Valve: The pulmonic valve was not well visualized. Pulmonic valve regurgitation is not visualized. No evidence of pulmonic stenosis. Aorta: The  aortic root and ascending aorta are structurally normal, with no evidence of dilitation. IAS/Shunts: The interatrial septum was not well visualized.  LEFT VENTRICLE PLAX 2D LVIDd:         4.20 cm   Diastology LVIDs:         2.60 cm   LV e' medial:    7.29 cm/s LV PW:         1.10 cm   LV E/e' medial:  12.9 LV IVS:        1.10 cm   LV e' lateral:   7.51 cm/s LVOT diam:     2.20 cm  LV E/e' lateral: 12.5 LV SV:         74 LV SV Index:   41 LVOT Area:     3.80 cm  RIGHT VENTRICLE RV Basal diam:  3.55 cm RV Mid diam:    3.30 cm RV S prime:     14.40 cm/s TAPSE (M-mode): 3.0 cm LEFT ATRIUM             Index        RIGHT ATRIUM           Index LA diam:        3.20 cm 1.80 cm/m   RA Area:     13.40 cm LA Vol (A2C):   52.1 ml 29.23 ml/m  RA Volume:   34.40 ml  19.30 ml/m LA Vol (A4C):   28.9 ml 16.21 ml/m LA Biplane Vol: 38.6 ml 21.65 ml/m  AORTIC VALVE AV Area (Vmax):    3.18 cm AV Area (Vmean):   3.22 cm AV Area (VTI):     3.18 cm AV Vmax:           140.00 cm/s AV Vmean:          93.700 cm/s AV VTI:            0.232 m AV Peak Grad:      7.8 mmHg AV Mean Grad:      4.0 mmHg LVOT Vmax:         117.00 cm/s LVOT Vmean:        79.300 cm/s LVOT VTI:          0.194 m LVOT/AV VTI ratio: 0.84  AORTA Ao Root diam: 3.40 cm Ao Asc diam:  3.00 cm MITRAL VALVE                TRICUSPID VALVE MV Area (PHT): 3.99 cm     TR Peak grad:   48.2 mmHg MV Decel Time: 190 msec     TR Vmax:        347.00 cm/s MV E velocity: 93.80 cm/s MV A velocity: 119.00 cm/s  SHUNTS MV E/A ratio:  0.79         Systemic VTI:  0.19 m                             Systemic Diam: 2.20 cm Dorn Ross MD Electronically signed by Dorn Ross MD Signature Date/Time: 08/07/2023/12:33:53 PM    Final    CT CHEST WO CONTRAST Result Date: 08/07/2023 CLINICAL DATA:  History of CLL.  Hematologic malignancy staging. EXAM: CT CHEST WITHOUT CONTRAST TECHNIQUE: Multidetector CT imaging of the chest was performed following the standard protocol without IV  contrast. RADIATION DOSE REDUCTION: This exam was performed according to the departmental dose-optimization program which includes automated exposure control, adjustment of the mA and/or kV according to patient size and/or use of iterative reconstruction technique. COMPARISON:  CT abdomen and pelvis 08/06/2023.  PET-CT 03/05/2023 FINDINGS: Cardiovascular: Heart size is normal without significant pericardial fluid. Coronary artery calcifications. Atherosclerotic calcifications in thoracic aorta without aneurysm. Mediastinum/Nodes: Compared to the PET-CT from 03/05/2023, there has been enlargement lymph nodes throughout the chest. Nodal tissue in the left supraclavicular area adjacent to the left thyroid  lobe measures 3.2 cm in the short axis on image 17/2 and previously measured 2.7 cm in the short axis. Prominent nodal tissue along the right side of the mediastinum on image 38/2 measures 3.2 cm in the  short axis and previously measured 2.9 cm. Index left axillary lymph node on image 36/2 measures 2.6 cm in the short axis previously measured 2.0 cm. This new or increased soft tissue along the posterior mediastinum and paraspinal region on image 88/2 that measures 2.2 cm in the short axis. There is also soft tissue along the right side of the posterior mediastinum and paraspinal region. Enlarged lymph nodes in the cardiophrenic fat. Enlarged right internal mammary lymph nodes. Lungs/Pleura: Trachea and mainstem bronchi are patent. Trace left pleural fluid with new pleural-based thickening particularly along the paraspinal regions. Volume loss in the posterior lower lobes particularly on the left side. Subtle ground-glass opacities along the anterior right lung adjacent to the right internal mammary chain are nonspecific but favor atelectasis. No suspicious pulmonary nodules. Upper Abdomen: Extensive lymphadenopathy in the upper abdomen. Spleen appears to be enlarged. Musculoskeletal: No acute bone abnormality. No  suspicious osseous lesions. IMPRESSION: 1. Progression of the lymph node enlargement throughout the chest compared to the PET-CT from 03/05/2023. Diffuse lymphadenopathy throughout the chest as described. 2. Markedly increased nodal tissue and soft tissue thickening in the posterior mediastinum and paraspinal regions. 3. Volume loss in the posterior lower lobes particularly on the left side. Trace left pleural fluid. 4.  Aortic Atherosclerosis (ICD10-I70.0). Electronically Signed   By: Juliene Balder M.D.   On: 08/07/2023 09:28   MR LUMBAR SPINE WO CONTRAST Result Date: 08/06/2023 CLINICAL DATA:  Myelopathy, acute, lumbar spine. EXAM: MRI LUMBAR SPINE WITHOUT CONTRAST TECHNIQUE: Multiplanar, multisequence MR imaging of the lumbar spine was performed. No intravenous contrast was administered. COMPARISON:  None Available. FINDINGS: Segmentation:  Standard. Alignment:  No substantial sagittal subluxation. Vertebrae: Diffusely low T1 signal throughout the bone marrow with heterogeneous signal in the sacral vertebral bodies. No evidence of acute fracture or discitis/osteomyelitis. Conus medullaris and cauda equina: Conus extends to the L1-L2 level. Conus and cauda equina appear normal. Paraspinal and other soft tissues: There appears to be intermediate extraosseous signal surrounding the vertebral bodies at multiple levels (for example see series 9, image 28 and series 9, image 20), possibly extraosseous extension of tumor but not well assessed without contrast. Please see same day CT of the abdomen/pelvis for description of retroperitoneal nodal mass and other intra-abdominal intrapelvic findings. Disc levels: T12-L1: No significant disc protrusion, foraminal stenosis, or canal stenosis. L1-L2: No significant disc protrusion, foraminal stenosis, or canal stenosis. L2-L3: No significant disc protrusion, foraminal stenosis, or canal stenosis. L3-L4: No significant disc protrusion, foraminal stenosis, or canal stenosis.  L4-L5: Mild disc bulge and endplate spurring without significant stenosis. L5-S1: Question subtle intermediate signal along the posterior aspect of the L5 vertebral body which partially effaces ventral CSF. Mild disc bulging at the disc level without significant canal or foraminal stenosis. Sacral canal: Question mild intermediate signal in the ventral canal without compressive stenosis. IMPRESSION: 1. Diffusely abnormal marrow signal, likely related to the patient's underlying lymphoma. 2. Masslike extraosseous paraspinal signal abnormality at multiple levels, and L4 and subtle, equivacal signal in the ventral canal in the lower lumbar canal and sacral canal could potentially represent extraosseous extension of tumor but is incompletely assessed without contrast. If the patient is able, consider postcontrast imaging. 3. No compressive canal or foraminal stenosis. Electronically Signed   By: Gilmore GORMAN Molt M.D.   On: 08/06/2023 19:37   CT ABDOMEN PELVIS WO CONTRAST Result Date: 08/06/2023 CLINICAL DATA:  Chronic lymphocytic leukemia. Acute abdominal pain. * Tracking Code: BO * EXAM: CT ABDOMEN AND PELVIS WITHOUT CONTRAST  TECHNIQUE: Multidetector CT imaging of the abdomen and pelvis was performed following the standard protocol without IV contrast. RADIATION DOSE REDUCTION: This exam was performed according to the departmental dose-optimization program which includes automated exposure control, adjustment of the mA and/or kV according to patient size and/or use of iterative reconstruction technique. COMPARISON:  PET-CT 03/05/2023 FINDINGS: Lower chest: Thickened paraspinal tissue in the posterior mediastinum (image 8/2). Findings consistent lymphoma. Hepatobiliary: No focal hepatic lesion. Normal gallbladder. No biliary duct dilatation. Common bile duct is normal. Pancreas: Pancreas is normal. No ductal dilatation. No pancreatic inflammation. Spleen: Spleen is mildly enlarged at 14. Cm in craniocaudad dimension  Adrenals/urinary tract: Adrenal glands and kidneys are normal. The ureters and bladder normal. Stomach/Bowel: Stomach, small bowel, appendix, and cecum are normal. The colon and rectosigmoid colon are normal. Vascular/Lymphatic: Bulky retroperitoneal lymphadenopathy increased from comparison exam. Retroperitoneal nodal mass at the level of the lower pole LEFT kidney LEFT aorta measures 7.4 cm (image 46/2) compared to 5.1 cm comparison PET-CT scan. The aorta is elevated by 19 mm by retroperitoneal adenopathy compared to 8 mm on prior. Example node ventral to the aorta measuring 33 mm on image 52 compares to 23 mm. Bulky adenopathy extends along the iliac chains. LEFT external iliac lymph node anterior to the operator space measuring 22 mm compared to 13 mm. Minimal inguinal adenopathy. Reproductive: Prostate unremarkable Other: No free fluid. Musculoskeletal: No aggressive osseous lesion. Posterior fusion of the L4 spinous processes IMPRESSION: 1. Progression of lymphoma lymphadenopathy with increase in bulky retroperitoneal nodal mass. 2. Increase in perispinal thickening in the posterior mediastinum. 3. Mild splenomegaly. 4. No bowel obstruction. Electronically Signed   By: Jackquline Boxer M.D.   On: 08/06/2023 18:20

## 2023-08-12 NOTE — Inpatient Diabetes Management (Signed)
 Inpatient Diabetes Program Recommendations  AACE/ADA: New Consensus Statement on Inpatient Glycemic Control (2015)  Target Ranges:  Prepandial:   less than 140 mg/dL      Peak postprandial:   less than 180 mg/dL (1-2 hours)      Critically ill patients:  140 - 180 mg/dL   Lab Results  Component Value Date   GLUCAP 385 (H) 08/12/2023   HGBA1C 5.8 (H) 08/07/2023    Review of Glycemic Control  Diabetes history: DM2 Outpatient Diabetes medications: Lantus  20-24 units at bedtime, Jardiance 12.5 mg QAM Current orders for Inpatient glycemic control: Semglee  8 units daily, Novolog 0-9 TID  HgbA1C - 5.8%  Inpatient Diabetes Program Recommendations:    Consider the following:  Increase Lantus  to 10 units every day  Add Novolog 3 units TID with meals while on steroids.   Continue to follow.  Thank you. Shona Brandy, RD, LDN, CDCES Inpatient Diabetes Coordinator [phone]

## 2023-08-12 NOTE — Progress Notes (Signed)
 PROGRESS NOTE    Tyler Scott  FMW:[id number] DOB: Apr 20, 1955 DOA: 08/06/2023 PCP: [name], MD   Brief Narrative:  Patient is a 68 year old male with history of insulin -dependent type type II, hypertension, CLL who presented with a complaint of progressive low back pain, not able to walk, decreased oral intake.  Lab work on presentation showed pancytopenia.  CT abdomen/pelvis showed  Progression of lymphoma lymphadenopathy with increase in bulky retroperitoneal nodal mass.  MRI of the lumbar spine showed Masslike extraosseous paraspinal signal abnormality at multiple levels.  Oncology consulted and following.  S/P  lymph node biopsy  Assessment & Plan:   Principal Problem:   AKI (acute kidney injury) (HCC) Active Problems:   CLL (chronic lymphocytic leukemia) (HCC)   Insulin  dependent type 2 diabetes mellitus (HCC)   Pancytopenia (HCC)  CLL/pancytopenia/SLL: Presented with low back pain.CT abdomen/pelvis showed  progression of lymphoma lymphadenopathy with increase in bulky retroperitoneal nodal mass.  MRI of the lumbar spine showed masslike extraosseous paraspinal signal abnormality at multiple levels.  History of CLL, was on different therapies.  Has adenopathy in the inguinal area.  Oncology suspecting transformation of CLL to aggressive lymphoma.  Suspicion of bone marrow involvement.  Given rasburicase  for high uric acid  level.  Oncology eventually planning for starting chemotherapy, Port-A-Cath placement. S/P retroperitoneal LN biopsy on 6/30.  Prelim path shows low-grade lymphoma.  Oncology has started the patient on IVIG as well as tapering dose of prednisone .  Hemoglobin was 6.5 yesterday, transfused 1 unit, hemoglobin over 9 today.  Awaiting further plans by oncology.   AKI: No obstructive pathology as per CT abdomen/pelvis.  UA unremarkable.  Hydrated.  Renal function normalized.   Hyponatremia: Received gentle hydration with normal saline yesterday,  sodium improving and 134 today.   Hypertension: Taking lisinopril  at home.  Started on amlodipine due to AKI.  Blood pressure controlled.   Insulin -dependent diabetes type 2: A1c of 5.8  Continue current insulin  regimen   Failure to thrive/generalized weakness: PT consulted, recommend CIR however, Per CIR, they spoke with patient's wife by phone and wife works a lot and does not have anyone who can stay with the patient after potential GIST and wife wants him to go to SNF.  I have consulted TOC for that.   Constipation: Still constipated, last BM 7 days ago.  Will try Dulcolax suppository.  DVT prophylaxis: SCDs Start: 08/11/23 0936 Place and maintain sequential compression device Start: 08/07/23 1058 Place and maintain sequential compression device Start: 08/07/23 1058   Code Status: Full Code  Family Communication:  None present at bedside.  Plan of care discussed with patient in length and he/she verbalized understanding and agreed with it.  Status is: Inpatient Remains inpatient appropriate because: Getting treatment for lymphoma.  Will be discharged when cleared by oncology.   Estimated body mass index is 22.05 kg/m as calculated from the following:   Height as of this encounter: 5' 8 (1.727 m).   Weight as of this encounter: 65.8 kg.    Nutritional Assessment: Body mass index is 22.05 kg/m.SABRA Seen by dietician.  I agree with the assessment and plan as outlined below: Nutrition Status:        . Skin Assessment: I have examined the patient's skin and I agree with the wound assessment as performed by the wound care RN as outlined below:    Consultants:  Oncology  Procedures:  As above  Antimicrobials:  Anti-infectives (From admission, onward)    Start  Dose/Rate Route Frequency Ordered Stop   08/11/23 0000  acyclovir  (ZOVIRAX ) 400 MG tablet        400 mg Oral Daily 08/11/23 2345     08/06/23 2145  acyclovir  (ZOVIRAX ) tablet 400 mg        400 mg Oral Daily  08/06/23 2133           Subjective: Seen and examined.  Complains of upper back and mid back pain.  No low back pain or inguinal pain.  No other complaint.  Objective: Vitals:   08/11/23 2212 08/11/23 2245 08/12/23 0600 08/12/23 1015  BP: (!) 145/80 127/67 123/70 128/64  Pulse: 93 86 88   Resp: 18 18 18    Temp: (!) 97.4 F (36.3 C) (!) 97.5 F (36.4 C) 97.7 F (36.5 C)   TempSrc: Oral Oral Oral   SpO2: 100% 100% 99%   Weight:      Height:        Intake/Output Summary (Last 24 hours) at 08/12/2023 1226 Last data filed at 08/12/2023 0930 Gross per 24 hour  Intake 635.63 ml  Output --  Net 635.63 ml   Filed Weights   08/06/23 1506  Weight: 65.8 kg    Examination:  General exam: Appears calm and comfortable  Respiratory system: Clear to auscultation. Respiratory effort normal. Cardiovascular system: S1 & S2 heard, RRR. No JVD, murmurs, rubs, gallops or clicks. No pedal edema. Gastrointestinal system: Abdomen is nondistended, soft and nontender. No organomegaly or masses felt. Normal bowel sounds heard. Central nervous system: Alert and oriented. No focal neurological deficits. Extremities: Symmetric 5 x 5 power. Skin: No rashes, lesions or ulcers.  Psychiatry: Judgement and insight appear normal. Mood & affect appropriate.   Data Reviewed: I have personally reviewed following labs and imaging studies  CBC: Recent Labs  Lab 08/08/23 0506 08/09/23 0458 08/10/23 0450 08/11/23 0444 08/11/23 1419 08/12/23 1100  WBC 2.5* 2.7* 3.0* 2.5* 5.2 7.4  NEUTROABS 0.8* 0.9* 1.5* 0.9*  --  2.5  HGB 8.7* 8.0* 7.7* 6.5* 9.2* 9.7*  HCT 28.0* 25.4* 23.7* 20.2* 27.8* 29.0*  MCV 103.3* 100.4* 98.8 99.0 95.9 96.3  PLT 30* 28* 29* 27* 40* 51*   Basic Metabolic Panel: Recent Labs  Lab 08/08/23 0506 08/09/23 0458 08/10/23 0450 08/11/23 0444 08/11/23 1419  NA 136 133* 128* 134* 132*  K 4.2 4.2 4.3 3.8 4.1  CL 106 103 98 107 98  CO2 21* 22 21* 20* 23  GLUCOSE 104* 152* 157*  120* 199*  BUN 32* 28* 23 19 20   CREATININE 1.42* 1.03 0.94 0.80 0.97  CALCIUM 9.1 9.2 9.3 8.7* 9.8   GFR: Estimated Creatinine Clearance: 68.8 mL/min (by C-G formula based on SCr of 0.97 mg/dL). Liver Function Tests: Recent Labs  Lab 08/08/23 0506 08/09/23 0458 08/10/23 0450 08/11/23 0444 08/11/23 1419  AST 16 14* 15 13* 17  ALT 8 8 8 8 8   ALKPHOS 65 63 61 53 71  BILITOT 1.1 1.0 1.2 0.8 1.4*  PROT 6.1* 5.9* 6.0* 5.3* 7.3  ALBUMIN 3.4* 3.2* 3.2* 2.8* 3.7   No results for input(s): LIPASE, AMYLASE in the last 168 hours. No results for input(s): AMMONIA in the last 168 hours. Coagulation Profile: Recent Labs  Lab 08/08/23 0506  INR 1.0   Cardiac Enzymes: No results for input(s): CKTOTAL, CKMB, CKMBINDEX, TROPONINI in the last 168 hours. BNP (last 3 results) No results for input(s): PROBNP in the last 8760 hours. HbA1C: No results for input(s): HGBA1C in the last  72 hours. CBG: Recent Labs  Lab 08/11/23 1151 08/11/23 1714 08/11/23 2110 08/12/23 0733 08/12/23 1138  GLUCAP 187* 231* 341* 301* 385*   Lipid Profile: No results for input(s): CHOL, HDL, LDLCALC, TRIG, CHOLHDL, LDLDIRECT in the last 72 hours. Thyroid  Function Tests: No results for input(s): TSH, T4TOTAL, FREET4, T3FREE, THYROIDAB in the last 72 hours. Anemia Panel: No results for input(s): VITAMINB12, FOLATE, FERRITIN, TIBC, IRON, RETICCTPCT in the last 72 hours. Sepsis Labs: No results for input(s): PROCALCITON, LATICACIDVEN in the last 168 hours.  Recent Results (from the past 240 hours)  Culture, blood (Routine X 2) w Reflex to ID Panel     Status: None   Collection Time: 08/07/23 12:44 AM   Specimen: BLOOD RIGHT ARM  Result Value Ref Range Status   Specimen Description   Final    BLOOD RIGHT ARM Performed at [facility], [address], [place], [place] [address]    Special Requests   Final    BOTTLES DRAWN AEROBIC AND ANAEROBIC  Blood Culture adequate volume Performed at [facility], [address] [place], [place] [address]    Culture   Final    NO GROWTH 5 DAYS Performed at [facility], [address], [place], [place] [address]    Report Status 08/12/2023 FINAL  Final  Culture, blood (Routine X 2) w Reflex to ID Panel     Status: None   Collection Time: 08/07/23 12:44 AM   Specimen: BLOOD LEFT ARM  Result Value Ref Range Status   Specimen Description   Final    BLOOD LEFT ARM Performed at [facility], [address] [place], [place] [address]    Special Requests   Final    BOTTLES DRAWN AEROBIC AND ANAEROBIC Blood Culture adequate volume Performed at [facility], [address] [place], [place] [address]    Culture   Final    NO GROWTH 5 DAYS Performed at [facility], [address] [place], [place] [address]    Report Status 08/12/2023 FINAL  Final     Radiology Studies: No results found.   Scheduled Meds:  acyclovir   400 mg Oral Daily   allopurinol   150 mg Oral Daily   amLODipine  10 mg Oral Daily   gabapentin  100 mg Oral QHS   insulin  aspart  0-9 Units Subcutaneous TID WC   insulin  glargine-yfgn  8 Units Subcutaneous Daily   lidocaine  1 patch Transdermal Q24H   polyethylene glycol  17 g Oral Daily   [START ON 08/24/2023] predniSONE   10 mg Oral Q breakfast   [START ON 08/22/2023] predniSONE   20 mg Oral Q breakfast   [START ON 08/20/2023] predniSONE   30 mg Oral Q breakfast   [START ON 08/18/2023] predniSONE   40 mg Oral Q breakfast   [START ON 08/16/2023] predniSONE   50 mg Oral Q breakfast   predniSONE   60 mg Oral Daily   senna-docusate  1 tablet Oral BID   simvastatin   20 mg Oral QHS   Continuous Infusions:  sodium chloride  75 mL/hr at 08/11/23 2219   Immune Globulin 10% 65 g, Immune Globulin 10% (PRIVIGEN) Stopped (08/11/23 2213)     LOS: 6 days   Fredia Skeeter, MD [facility]  08/12/2023, 12:26 PM   *Please note  that this is a verbal dictation therefore any spelling or grammatical errors are due to the Dragon Medical One system interpretation.  Please page via Amion  and do not message via secure chat for urgent patient care matters. Secure chat can be used for non urgent patient care matters.  How to contact the [facility] Attending or Consulting provider 7A - 7P or covering provider during after hours 7P -7A, for this patient?  Check the care team in [facility] and look for a) attending/consulting [facility] provider listed and b) the [facility] team listed. Page or secure chat 7A-7P. Log into [email or web address] and use [place]'s universal password to access. If you do not have the password, please contact the hospital operator. Locate the [facility] provider you are looking for under [facility] and page to a number that you can be directly reached. If you still have difficulty reaching the provider, please page the [facility] (Director on Call) for the Hospitalists listed on amion for assistance.

## 2023-08-12 NOTE — Progress Notes (Signed)
 Physical Therapy Treatment Patient Details Name: Tyler Scott MRN: [id number] DOB: [birth year] Today's Date: 08/12/2023   History of Present Illness 68 year old male who presented with a complaint of progressive low back pain, not able to walk, decreased oral intake.  Lab work on presentation showed pancytopenia.  CT abdomen/pelvis showed progression of lymphoma lymphadenopathy with increase in bulky retroperitoneal nodal mass.  MRI of the lumbar spine showed Masslike extraosseous paraspinal signal abnormality at multiple  Levels.  Oncology consulted.  Plan for possible lymph node biopsy, Port-A-Cath placement for starting of chemotherapy.  PMHx:  insulin -dependent type type II, hypertension, CLL    PT Comments  Pt agreeable to session, nursing trending vitals and beginning IV meds. He demonstrates significant improvement today, recent steroid dose initiated, pt expresses concern over his ability to maintain this level of function. He is able to complete 414ft with RW at supervision A with IV pole follow. Pt returns to room for further vitals and would benefit from increased therapy frequency to address function with the consideration that he will be going home with his wife working most of the day and he will be home alone.     If plan is discharge home, recommend the following: Assistance with cooking/housework;Assist for transportation;Help with stairs or ramp for entrance;A little help with walking and/or transfers;A little help with bathing/dressing/bathroom   Can travel by private vehicle        Equipment Recommendations  Rolling walker (2 wheels)    Recommendations for Other Services       Precautions / Restrictions Precautions Precautions: Fall Recall of Precautions/Restrictions: Intact Restrictions Weight Bearing Restrictions Per Provider Order: No     Mobility  Bed Mobility               General bed mobility comments: pt was received seated in the chair     Transfers Overall transfer level: Needs assistance Equipment used: Rolling walker (2 wheels) Transfers: Sit to/from Stand Sit to Stand: Supervision                Ambulation/Gait Ambulation/Gait assistance: Supervision Gait Distance (Feet): 400 Feet Assistive device: Rolling walker (2 wheels) Gait Pattern/deviations: Narrow base of support, Step-through pattern, Decreased stride length Gait velocity: decr     General Gait Details: Pt recently began steroid treatment, doing much better today, able to amb around the unit with step through pattern evident and near symmetric stride BLE   Stairs             Wheelchair Mobility     Tilt Bed    Modified Rankin (Stroke Patients Only)       Balance Overall balance assessment: Needs assistance Sitting-balance support: No upper extremity supported, Feet supported Sitting balance-Leahy Scale: Good     Standing balance support: Reliant on assistive device for balance, Single extremity supported Standing balance-Leahy Scale: Good Standing balance comment: able to remove 1 UE at a time to Actuary Communication Communication: No apparent difficulties  Cognition Arousal: Alert Behavior During Therapy: WFL for tasks assessed/performed   PT - Cognitive impairments: No apparent impairments                         Following commands: Intact      Cueing Cueing Techniques: Verbal cues  Exercises  General Comments General comments (skin integrity, edema, etc.): able to complete 180 degree turn within the walker      Pertinent Vitals/Pain Pain Assessment Pain Assessment: No/denies pain Pain Intervention(s): Monitored during session    Home Living                          Prior Function            PT Goals (current goals can now be found in the care plan section) Acute Rehab PT Goals PT Goal Formulation: With  patient/family Time For Goal Achievement: 08/21/23 Potential to Achieve Goals: Good Progress towards PT goals: Progressing toward goals    Frequency    Min 3X/week      PT Plan      Co-evaluation              AM-PAC PT 6 Clicks Mobility   Outcome Measure  Help needed turning from your back to your side while in a flat bed without using bedrails?: A Little Help needed moving from lying on your back to sitting on the side of a flat bed without using bedrails?: A Little Help needed moving to and from a bed to a chair (including a wheelchair)?: None Help needed standing up from a chair using your arms (e.g., wheelchair or bedside chair)?: None Help needed to walk in hospital room?: A Little Help needed climbing 3-5 steps with a railing? : A Little 6 Click Score: 20    End of Session Equipment Utilized During Treatment: Gait belt Activity Tolerance: Patient tolerated treatment well Patient left: with call bell/phone within reach;in chair;with chair alarm set Nurse Communication: Mobility status PT Visit Diagnosis: Difficulty in walking, not elsewhere classified (R26.2);Unsteadiness on feet (R26.81);Muscle weakness (generalized) (M62.81)     Time: 8447-8384 PT Time Calculation (min) (ACUTE ONLY): 23 min  Charges:    $Gait Training: 23-37 mins PT General Charges $$ ACUTE PT VISIT: 1 Visit                     Stann, PT Acute Rehabilitation Services Office: [phone] 08/12/2023    Stann DELENA Ohara 08/12/2023, 4:48 PM

## 2023-08-12 NOTE — Plan of Care (Signed)

## 2023-08-12 NOTE — Progress Notes (Signed)
 Prior Authorization requests for Dexamethasone  4mg  Tablets, Prochlorperazine  10 mg Tablets, and Ondansetron  8 mg Tablets denied by [employer]. Patient's drug coverage is limited to drugs that would be covered under Part B of the original Medicare and meet part B coverage. Medications do not meet Part B coverage. Pharmacy notified. Patient notified. Patient has a discount card that he has been using at the Pharmacy. No other needs or concerns noted at this time.

## 2023-08-13 DIAGNOSIS — N179 Acute kidney failure, unspecified: Secondary | ICD-10-CM | POA: Diagnosis not present

## 2023-08-13 LAB — CBC WITH DIFFERENTIAL/PLATELET
Abs Immature Granulocytes: 0.04 K/uL (ref 0.00–0.07)
Abs Immature Granulocytes: 0.05 K/uL (ref 0.00–0.07)
Basophils Absolute: 0 K/uL (ref 0.0–0.1)
Basophils Absolute: 0 K/uL (ref 0.0–0.1)
Basophils Relative: 0 %
Basophils Relative: 0 %
Eosinophils Absolute: 0 K/uL (ref 0.0–0.5)
Eosinophils Absolute: 0 K/uL (ref 0.0–0.5)
Eosinophils Relative: 0 %
Eosinophils Relative: 0 %
HCT: 20.2 % — ABNORMAL LOW (ref 39.0–52.0)
HCT: 24 % — ABNORMAL LOW (ref 39.0–52.0)
Hemoglobin: 6.5 g/dL — CL (ref 13.0–17.0)
Hemoglobin: 7.9 g/dL — ABNORMAL LOW (ref 13.0–17.0)
Immature Granulocytes: 2 %
Immature Granulocytes: 1 %
Lymphocytes Relative: 52 %
Lymphocytes Relative: 49 %
Lymphs Abs: 1.3 K/uL (ref 0.7–4.0)
Lymphs Abs: 2.6 K/uL (ref 0.7–4.0)
MCH: 31.9 pg (ref 26.0–34.0)
MCH: 31.5 pg (ref 26.0–34.0)
MCHC: 32.2 g/dL (ref 30.0–36.0)
MCHC: 32.9 g/dL (ref 30.0–36.0)
MCV: 99 fL (ref 80.0–100.0)
MCV: 95.6 fL (ref 80.0–100.0)
Monocytes Absolute: 0.2 K/uL (ref 0.1–1.0)
Monocytes Absolute: 0.7 K/uL (ref 0.1–1.0)
Monocytes Relative: 9 %
Monocytes Relative: 12 %
Neutro Abs: 0.9 K/uL — ABNORMAL LOW (ref 1.7–7.7)
Neutro Abs: 2 K/uL (ref 1.7–7.7)
Neutrophils Relative %: 37 %
Neutrophils Relative %: 38 %
Platelets: 27 K/uL — CL (ref 150–400)
Platelets: 37 K/uL — ABNORMAL LOW (ref 150–400)
RBC: 2.04 MIL/uL — ABNORMAL LOW (ref 4.22–5.81)
RBC: 2.51 MIL/uL — ABNORMAL LOW (ref 4.22–5.81)
RDW: 16 % — ABNORMAL HIGH (ref 11.5–15.5)
RDW: 15.5 % (ref 11.5–15.5)
WBC: 2.5 K/uL — ABNORMAL LOW (ref 4.0–10.5)
WBC: 5.3 K/uL (ref 4.0–10.5)
nRBC: 0 % (ref 0.0–0.2)
nRBC: 0 % (ref 0.0–0.2)

## 2023-08-13 LAB — BASIC METABOLIC PANEL WITH GFR
Anion gap: 9 (ref 5–15)
BUN: 34 mg/dL — ABNORMAL HIGH (ref 8–23)
CO2: 22 mmol/L (ref 22–32)
Calcium: 8.6 mg/dL — ABNORMAL LOW (ref 8.9–10.3)
Chloride: 98 mmol/L (ref 98–111)
Creatinine, Ser: 0.81 mg/dL (ref 0.61–1.24)
GFR, Estimated: >60 mL/min (ref >60)
Glucose, Bld: 291 mg/dL — ABNORMAL HIGH (ref 70–99)
Potassium: 4.2 mmol/L (ref 3.5–5.1)
Sodium: 129 mmol/L — ABNORMAL LOW (ref 135–145)

## 2023-08-13 LAB — GLUCOSE, CAPILLARY
Glucose-Capillary: 235 mg/dL — ABNORMAL HIGH (ref 70–99)
Glucose-Capillary: 262 mg/dL — ABNORMAL HIGH (ref 70–99)
Glucose-Capillary: 288 mg/dL — ABNORMAL HIGH (ref 70–99)
Glucose-Capillary: 289 mg/dL — ABNORMAL HIGH (ref 70–99)
Glucose-Capillary: 309 mg/dL — ABNORMAL HIGH (ref 70–99)
Glucose-Capillary: 353 mg/dL — ABNORMAL HIGH (ref 70–99)
Glucose-Capillary: 434 mg/dL — ABNORMAL HIGH (ref 70–99)

## 2023-08-13 MED ORDER — ZOLPIDEM TARTRATE 5 MG PO TABS
10.0000 mg | ORAL_TABLET | Freq: Every evening | ORAL | Status: DC | PRN
  Administered 2023-08-13 – 2023-08-17 (×5): 10 mg via ORAL
  Filled 2023-08-13 (×5): qty 2

## 2023-08-13 MED ORDER — INSULIN GLARGINE-YFGN 100 UNIT/ML ~~LOC~~ SOLN
20.0000 [IU] | Freq: Every day | SUBCUTANEOUS | Status: DC
  Administered 2023-08-13: 20 [IU] via SUBCUTANEOUS
  Filled 2023-08-13 (×2): qty 0.2

## 2023-08-13 MED ORDER — INSULIN ASPART 100 UNIT/ML IJ SOLN
6.0000 [IU] | Freq: Once | INTRAMUSCULAR | Status: AC
  Administered 2023-08-13: 6 [IU] via SUBCUTANEOUS

## 2023-08-13 NOTE — Progress Notes (Signed)
 PROGRESS NOTE    Tyler Scott  FMW:[id number] DOB: 31-May-1955 DOA: 08/06/2023 PCP: [name], MD   Brief Narrative:  Patient is a 67 year old male with history of insulin -dependent type type II, hypertension, CLL who presented with a complaint of progressive low back pain, not able to walk, decreased oral intake.  Lab work on presentation showed pancytopenia.  CT abdomen/pelvis showed  Progression of lymphoma lymphadenopathy with increase in bulky retroperitoneal nodal mass.  MRI of the lumbar spine showed Masslike extraosseous paraspinal signal abnormality at multiple levels.  Oncology consulted and following.  S/P  lymph node biopsy  Assessment & Plan:   Principal Problem:   AKI (acute kidney injury) (HCC) Active Problems:   CLL (chronic lymphocytic leukemia) (HCC)   Insulin  dependent type 2 diabetes mellitus (HCC)   Pancytopenia (HCC)   Lymphoma (HCC)  CLL/pancytopenia/SLL: Presented with low back pain.CT abdomen/pelvis showed  progression of lymphoma lymphadenopathy with increase in bulky retroperitoneal nodal mass.  MRI of the lumbar spine showed masslike extraosseous paraspinal signal abnormality at multiple levels.  History of CLL, was on different therapies.  Has adenopathy in the inguinal area.  Oncology suspecting transformation of CLL to aggressive lymphoma.  Suspicion of bone marrow involvement.  Given rasburicase   for high uric acid  level.  Oncology eventually planning for starting chemotherapy, Port-A-Cath placement. S/P retroperitoneal LN biopsy on 6/30.  Prelim path shows low-grade lymphoma.  Patient was started on 2 doses of IVIG and high-dose of prednisone  with recommendations to taper after few days.  Hemoglobin was 6.5 on 08/11/2023 for which she was given 1 unit of PRBC transfusion, hemoglobin improved to over 9 yesterday but down to 7.9 today.   AKI: No obstructive pathology as per CT abdomen/pelvis.  UA unremarkable.  Hydrated.  Renal function  normalized.   Hyponatremia: Received gentle hydration with normal saline yesterday, sodium improved to 132 yesterday but down to 129 today.   Hypertension: Taking lisinopril  at home.  Started on amlodipine  due to AKI.  Blood pressure controlled.   Insulin -dependent diabetes type 2: A1c of 5.8.  Currently on 8 units of Lantus  and SSI.  Significantly hyperglycemic in the 300 range, will increase Lantus  to 20 units.   Failure to thrive/generalized weakness: PT consulted, recommend CIR however, Per CIR, they spoke with patient's wife by phone and wife works a lot and does not have anyone who can stay with the patient after potential GIST and wife wants him to go to SNF.  TOC on board, pending placement.   Constipation: Continue current management.  No further constipation.  DVT prophylaxis: SCDs Start: 08/11/23 0936 Place and maintain sequential compression device Start: 08/07/23 1058 Place and maintain sequential compression device Start: 08/07/23 1058   Code Status: Full Code  Family Communication:  None present at bedside.  Plan of care discussed with patient in length and he/she verbalized understanding and agreed with it.  Status is: Inpatient Remains inpatient appropriate because: Now medically stable for discharge, pending placement to SNF.   Estimated body mass index is 22.05 kg/m as calculated from the following:   Height as of this encounter: 5' 8 (1.727 m).   Weight as of this encounter: 65.8 kg.    Nutritional Assessment: Body mass index is 22.05 kg/m.SABRA Seen by dietician.  I agree with the assessment and plan as outlined below: Nutrition Status:        . Skin Assessment: I have examined the patient's skin and I agree with the wound assessment as performed by  the wound care RN as outlined below:    Consultants:  Oncology  Procedures:  As above  Antimicrobials:  Anti-infectives (From admission, onward)    Start     Dose/Rate Route Frequency Ordered Stop    08/11/23 0000  acyclovir  (ZOVIRAX ) 400 MG tablet        400 mg Oral Daily 08/11/23 2345     08/06/23 2145  acyclovir  (ZOVIRAX ) tablet 400 mg        400 mg Oral Daily 08/06/23 2133           Subjective: Patient seen and examined.  No new complaint today.  Objective: Vitals:   08/12/23 2000 08/12/23 2100 08/12/23 2200 08/13/23 0309  BP: 131/65 133/80 132/63 136/70  Pulse: 82 84 87 91  Resp: 17 18 18 18   Temp: 97.9 F (36.6 C) 97.6 F (36.4 C) 97.9 F (36.6 C) 97.7 F (36.5 C)  TempSrc: Oral Oral Oral   SpO2: 100% 100% 100% 100%  Weight:      Height:        Intake/Output Summary (Last 24 hours) at 08/13/2023 1140 Last data filed at 08/13/2023 0500 Gross per 24 hour  Intake 2954.45 ml  Output 1525 ml  Net 1429.45 ml   Filed Weights   08/06/23 1506  Weight: 65.8 kg    Examination:  General exam: Appears calm and comfortable  Respiratory system: Clear to auscultation. Respiratory effort normal. Cardiovascular system: S1 & S2 heard, RRR. No JVD, murmurs, rubs, gallops or clicks. No pedal edema. Gastrointestinal system: Abdomen is nondistended, soft and nontender. No organomegaly or masses felt. Normal bowel sounds heard. Central nervous system: Alert and oriented. No focal neurological deficits. Extremities: Symmetric 5 x 5 power. Skin: No rashes, lesions or ulcers.   Data Reviewed: I have personally reviewed following labs and imaging studies  CBC: Recent Labs  Lab 08/09/23 0458 08/10/23 0450 08/11/23 0444 08/11/23 1419 08/12/23 1100 08/13/23 0428  WBC 2.7* 3.0* 2.5* 5.2 7.4 5.3  NEUTROABS 0.9* 1.5* 0.9*  --  2.5 2.0  HGB 8.0* 7.7* 6.5* 9.2* 9.7* 7.9*  HCT 25.4* 23.7* 20.2* 27.8* 29.0* 24.0*  MCV 100.4* 98.8 99.0 95.9 96.3 95.6  PLT 28* 29* 27* 40* 51* 37*   Basic Metabolic Panel: Recent Labs  Lab 08/09/23 0458 08/10/23 0450 08/11/23 0444 08/11/23 1419 08/13/23 0428  NA 133* 128* 134* 132* 129*  K 4.2 4.3 3.8 4.1 4.2  CL 103 98 107 98 98  CO2 22  21* 20* 23 22  GLUCOSE 152* 157* 120* 199* 291*  BUN 28* 23 19 20  34*  CREATININE 1.03 0.94 0.80 0.97 0.81  CALCIUM 9.2 9.3 8.7* 9.8 8.6*   GFR: Estimated Creatinine Clearance: 82.4 mL/min (by C-G formula based on SCr of 0.81 mg/dL). Liver Function Tests: Recent Labs  Lab 08/08/23 0506 08/09/23 0458 08/10/23 0450 08/11/23 0444 08/11/23 1419  AST 16 14* 15 13* 17  ALT 8 8 8 8 8   ALKPHOS 65 63 61 53 71  BILITOT 1.1 1.0 1.2 0.8 1.4*  PROT 6.1* 5.9* 6.0* 5.3* 7.3  ALBUMIN 3.4* 3.2* 3.2* 2.8* 3.7   No results for input(s): LIPASE, AMYLASE in the last 168 hours. No results for input(s): AMMONIA in the last 168 hours. Coagulation Profile: Recent Labs  Lab 08/08/23 0506  INR 1.0   Cardiac Enzymes: No results for input(s): CKTOTAL, CKMB, CKMBINDEX, TROPONINI in the last 168 hours. BNP (last 3 results) No results for input(s): PROBNP in the last 8760 hours. HbA1C:  No results for input(s): HGBA1C in the last 72 hours. CBG: Recent Labs  Lab 08/12/23 1732 08/12/23 1937 08/12/23 2222 08/13/23 0313 08/13/23 0741  GLUCAP 319* 407* 384* 288* 262*   Lipid Profile: No results for input(s): CHOL, HDL, LDLCALC, TRIG, CHOLHDL, LDLDIRECT in the last 72 hours. Thyroid  Function Tests: No results for input(s): TSH, T4TOTAL, FREET4, T3FREE, THYROIDAB in the last 72 hours. Anemia Panel: No results for input(s): VITAMINB12, FOLATE, FERRITIN, TIBC, IRON, RETICCTPCT in the last 72 hours. Sepsis Labs: No results for input(s): PROCALCITON, LATICACIDVEN in the last 168 hours.  Recent Results (from the past 240 hours)  Culture, blood (Routine X 2) w Reflex to ID Panel     Status: None   Collection Time: 08/07/23 12:44 AM   Specimen: BLOOD RIGHT ARM  Result Value Ref Range Status   Specimen Description   Final    BLOOD RIGHT ARM Performed at [facility], [address], [place], [place] [address]    Special Requests   Final     BOTTLES DRAWN AEROBIC AND ANAEROBIC Blood Culture adequate volume Performed at [facility], [address], [place], [place] [address]    Culture   Final    NO GROWTH 5 DAYS Performed at [facility], [address], [place], [place] [address]    Report Status 08/12/2023 FINAL  Final  Culture, blood (Routine X 2) w Reflex to ID Panel     Status: None   Collection Time: 08/07/23 12:44 AM   Specimen: BLOOD LEFT ARM  Result Value Ref Range Status   Specimen Description   Final    BLOOD LEFT ARM Performed at [facility], [address], [place], [place] [address]    Special Requests   Final    BOTTLES DRAWN AEROBIC AND ANAEROBIC Blood Culture adequate volume Performed at [facility], [address] [place], [place] [address]    Culture   Final    NO GROWTH 5 DAYS Performed at [facility], [address], [place], [place] [address]    Report Status 08/12/2023 FINAL  Final     Radiology Studies: No results found.   Scheduled Meds:  acyclovir   400 mg Oral Daily   allopurinol   150 mg Oral Daily   amLODipine   10 mg Oral Daily   gabapentin   100 mg Oral QHS   insulin  aspart  0-9 Units Subcutaneous TID WC   insulin  glargine-yfgn  20 Units Subcutaneous Daily   lidocaine   1 patch Transdermal Q24H   polyethylene glycol  17 g Oral Daily   [START ON 08/24/2023] predniSONE   10 mg Oral Q breakfast   [START ON 08/22/2023] predniSONE   20 mg Oral Q breakfast   [START ON 08/20/2023] predniSONE   30 mg Oral Q breakfast   [START ON 08/18/2023] predniSONE   40 mg Oral Q breakfast   [START ON 08/16/2023] predniSONE   50 mg Oral Q breakfast   predniSONE   60 mg Oral Daily   senna-docusate  1 tablet Oral BID   simvastatin   20 mg Oral QHS   Continuous Infusions:     LOS: 7 days   Fredia Skeeter, MD [facility]  08/13/2023, 11:40 AM   *Please note that this is a verbal dictation therefore any spelling or grammatical errors are due  to the Dragon Medical One system interpretation.  Please page via Amion and do not message via secure chat for urgent patient care matters. Secure chat  can be used for non urgent patient care matters.  How to contact the [facility] Attending or Consulting provider 7A - 7P or covering provider during after hours 7P -7A, for this patient?  Check the care team in [facility] and look for a) attending/consulting [facility] provider listed and b) the [facility] team listed. Page or secure chat 7A-7P. Log into [email or web address] and use 's universal password to access. If you do not have the password, please contact the hospital operator. Locate the [facility] provider you are looking for under [facility] and page to a number that you can be directly reached. If you still have difficulty reaching the provider, please page the [facility] (Director on Call) for the Hospitalists listed on amion for assistance.

## 2023-08-13 NOTE — Plan of Care (Signed)

## 2023-08-13 NOTE — Progress Notes (Signed)
 OT Cancellation Note  Patient Details Name: Tyler Scott MRN: [id number] DOB: 1955-05-19   Cancelled Treatment:    Reason Eval/Treat Not Completed: Fatigue/lethargy limiting ability to participate;Patient declined, no reason specified Patient requested OT to try for later or tomorrow. Reported he did not sleep well and had meds making him feel groggy. OT will continue to follow acutely and re-attempt visit as schedule and patient allows.   Akeira Lahm OT/L Acute Rehabilitation Department  [phone]   08/13/2023, 9:39 AM

## 2023-08-13 NOTE — Progress Notes (Signed)
 BS=[phone] Lavanda Horns, NP notified. Orders to be added shortly per her response.

## 2023-08-13 NOTE — Progress Notes (Addendum)
 Physical Therapy Treatment Patient Details Name: Tyler Scott MRN: [id number] DOB: December 15, 1955 Today's Date: 08/13/2023   History of Present Illness 68 year old male who presented with a complaint of progressive low back pain, not able to walk, decreased oral intake.  Lab work on presentation showed pancytopenia.  CT abdomen/pelvis showed progression of lymphoma lymphadenopathy with increase in bulky retroperitoneal nodal mass.  MRI of the lumbar spine showed Masslike extraosseous paraspinal signal abnormality at multiple  Levels.  Oncology consulted.  Plan for possible lymph node biopsy, Port-A-Cath placement for starting of chemotherapy.  PMHx:  insulin -dependent type type II, hypertension, CLL    PT Comments  Pt received in recliner endorsing fatigue, I really didn't sleep well last night. Pt demonstrated STS to RW from recliner with SUP, no physical assist required nor overt LOB noted. Pt ambulated with RW 82ft with Sup, distance limited by fatigue. Encouraged pt to utilize RW to ambulate to bathroom while off IV pole, staff supervision as needed, informed RN. Pt guided through standing exercises with single and BUE support, pt endorsing groin pain. Provided stretch to bilateral hamstrings and gastrocs x30s in sitting. Pt on mobility specialist caseload. Provided education on pacing of activities and performing most important activities first with extended recovery period as needed, pt verbalized understanding. Provided therapeutic encouragement as pt discouraged. Discharge location remains in flux based on wife's work schedule and pt's progress. We will continue to follow acutely.     If plan is discharge home, recommend the following: Assistance with cooking/housework;Assist for transportation;Help with stairs or ramp for entrance;A little help with walking and/or transfers;A little help with bathing/dressing/bathroom   Can travel by private vehicle        Equipment Recommendations   Rolling walker (2 wheels)    Recommendations for Other Services       Precautions / Restrictions Precautions Precautions: Fall Recall of Precautions/Restrictions: Intact Restrictions Weight Bearing Restrictions Per Provider Order: No     Mobility  Bed Mobility               General bed mobility comments: Pt received seated in recliner and in recliner at exit    Transfers Overall transfer level: Needs assistance Equipment used: Rolling walker (2 wheels) Transfers: Sit to/from Stand Sit to Stand: Supervision           General transfer comment: SUP for saety    Ambulation/Gait Ambulation/Gait assistance: Supervision Gait Distance (Feet): 80 Feet Assistive device: Rolling walker (2 wheels) Gait Pattern/deviations: Narrow base of support, Step-through pattern, Decreased stride length Gait velocity: decr     General Gait Details: Pt instructed in gait trainin with RW and SUP including 180deg turn, no overt LOB noted nor physical assist required   Stairs             Wheelchair Mobility     Tilt Bed    Modified Rankin (Stroke Patients Only)       Balance Overall balance assessment: Needs assistance Sitting-balance support: No upper extremity supported, Feet supported Sitting balance-Leahy Scale: Good     Standing balance support: Bilateral upper extremity supported, During functional activity Standing balance-Leahy Scale: Fair Standing balance comment: During ambulation required BUE, during standing exercise able to peform single UE                            Communication Communication Communication: No apparent difficulties  Cognition Arousal: Alert Behavior During Therapy: WFL for tasks assessed/performed   PT -  Cognitive impairments: No apparent impairments                         Following commands: Intact      Cueing Cueing Techniques: Verbal cues  Exercises General Exercises - Lower Extremity Hip  ABduction/ADduction: AROM, Both, 10 reps, Standing Hip Flexion/Marching: AROM, Both, 10 reps, Standing Toe Raises: AROM, Both, 10 reps, Standing    General Comments        Pertinent Vitals/Pain Pain Assessment Pain Assessment: 0-10 Faces Pain Scale: Hurts even more Pain Location: low back, groin with mobility (biopsy site (lumbar)) Pain Descriptors / Indicators: Sore, Grimacing, Guarding, Shooting Pain Intervention(s): Monitored during session, Repositioned    Home Living                          Prior Function            PT Goals (current goals can now be found in the care plan section) Acute Rehab PT Goals Patient Stated Goal: Get stronger PT Goal Formulation: With patient/family Time For Goal Achievement: 08/21/23 Potential to Achieve Goals: Good Progress towards PT goals: Progressing toward goals    Frequency    Min 3X/week      PT Plan      Co-evaluation              AM-PAC PT 6 Clicks Mobility   Outcome Measure  Help needed turning from your back to your side while in a flat bed without using bedrails?: A Little Help needed moving from lying on your back to sitting on the side of a flat bed without using bedrails?: A Little Help needed moving to and from a bed to a chair (including a wheelchair)?: A Little Help needed standing up from a chair using your arms (e.g., wheelchair or bedside chair)?: A Little Help needed to walk in hospital room?: A Little Help needed climbing 3-5 steps with a railing? : A Little 6 Click Score: 18    End of Session Equipment Utilized During Treatment: Gait belt Activity Tolerance: Patient limited by pain Patient left: with call bell/phone within reach;in chair;with chair alarm set Nurse Communication: Mobility status PT Visit Diagnosis: Difficulty in walking, not elsewhere classified (R26.2);Unsteadiness on feet (R26.81);Muscle weakness (generalized) (M62.81)     Time: 8390-8364 PT Time Calculation  (min) (ACUTE ONLY): 26 min  Charges:    $Gait Training: 8-22 mins $Therapeutic Exercise: 8-22 mins PT General Charges $$ ACUTE PT VISIT: 1 Visit                     Elsie Grieves, PT, DPT [facility] Rehabilitation Department Office: [phone]   Elsie Grieves 08/13/2023, 4:56 PM

## 2023-08-13 NOTE — TOC Progression Note (Signed)
 Transition of Care [facility]) - Progression Note    Patient Details  Name: Tyler Scott MRN: [id number] Date of Birth: 20-Jul-1955  [facility] CM/SW Contact  Doneta Glenys DASEN, RN Phone Number: 08/13/2023, 3:06 PM  Clinical Narrative:    [facility] due [facility]. CM explained to patient and he agreeable to a SNF work-up. MUST ID # [id number], DONALD - [id number] A SNF referrals sent out in the HUB. Awaiting bed offers.   Expected Discharge Plan: Home w Home Health Services Barriers to Discharge: Continued Medical Work up  Expected Discharge Plan and Services In-house Referral: NA Discharge Planning Services: CM Consult   Living arrangements for the past 2 months: Single Family Home                   DME Agency: NA       HH Arranged: NA HH Agency: NA         Social Determinants of Health (SDOH) Interventions SDOH Screenings   Food Insecurity: No Food Insecurity (08/06/2023)  Housing: Low Risk  (08/10/2023)  Transportation Needs: No Transportation Needs (08/06/2023)  Utilities: Not At Risk (08/06/2023)  Social Connections: Socially Isolated (08/06/2023)  Tobacco Use: Low Risk  (08/06/2023)    Readmission Risk Interventions    08/07/2023    8:49 AM  Readmission Risk Prevention Plan  Transportation Screening Complete  PCP or Specialist Appt within 5-7 Days Complete  Home Care Screening Complete  Medication Review (RN CM) Complete

## 2023-08-13 NOTE — [place] FL2 (Signed)
 [place]  MEDICAID FL2 LEVEL OF CARE FORM     IDENTIFICATION  Patient Name: Tyler Scott Birthdate: 10-28-1955 Sex: male Admission Date (Current Location): 08/06/2023  [facility] and [place] Number:  Producer, television/film/video and Address:  [facility],  [address], [place] [address]      Provider Number: [id number]  Attending Physician Name and Address:  Vernon Ranks, MD  Relative Name and Phone Number:       Current Level of Care: Hospital Recommended Level of Care: Skilled Nursing Facility Prior Approval Number:    Date Approved/Denied:   PASRR Number: [id number]  Discharge Plan: SNF    Current Diagnoses: Patient Active Problem List   Diagnosis Date Noted   Lymphoma (HCC) 08/12/2023   Chronic ITP (idiopathic thrombocytopenia) (HCC) 08/11/2023   Insulin  dependent type 2 diabetes mellitus (HCC) 08/06/2023   AKI (acute kidney injury) (HCC) 08/06/2023   Pancytopenia (HCC) 08/06/2023   Controlled type 2 diabetes mellitus with retinopathy, with long-term current use of insulin  (HCC) 09/14/2016   Hx of adenomatous polyp of colon 05/24/2014   Family history of colon cancer - brother 70 05/18/2014   Hyperkalemia 01/21/2013   Splenomegaly 01/21/2013   CLL (chronic lymphocytic leukemia) (HCC)    Type 1 diabetes mellitus (HCC) 12/29/2007   Hyperlipidemia 12/30/2006   Essential hypertension 12/30/2006   ERECTILE DYSFUNCTION, ORGANIC 12/30/2006   LOW BACK PAIN, ACUTE 12/21/2006    Orientation RESPIRATION BLADDER Height & Weight     Self, Time, Situation  Normal Continent Weight: 65.8 kg Height:  5' 8 (172.7 cm)  BEHAVIORAL SYMPTOMS/MOOD NEUROLOGICAL BOWEL NUTRITION STATUS      Continent Diet  AMBULATORY STATUS COMMUNICATION OF NEEDS Skin   Limited Assist Verbally Normal                       Personal Care Assistance Level of Assistance  Bathing, Feeding, Dressing Bathing Assistance: Limited assistance Feeding assistance: Limited  assistance Dressing Assistance: Limited assistance     Functional Limitations Info             SPECIAL CARE FACTORS FREQUENCY  PT (By licensed PT), OT (By licensed OT)     PT Frequency: 5X weekly OT Frequency: 5X weekly            Contractures Contractures Info: Not present    Additional Factors Info  Code Status, Allergies, Insulin  Sliding Scale Code Status Info: FULL Allergies Info: NKA   Insulin  Sliding Scale Info: insulin  aspart (novoLOG ) injection 0-9 Units 0-9 Units, Subcutaneous, 3 times daily with meals, First dose on Sat 08/07/23 at 0800  Correction coverage: Sensitive (thin, NPO, renal)  CBG < 70: Implement Hypoglycemia Standing Orders and refer to Hypoglycemia Standing Orders sidebar report  CBG 70 - 120: 0 units  CBG 121 - 150: 1 unit  CBG 151 - 200: 2 units  CBG 201 - 250: 3 units  CBG 251 - 300: 5 units  CBG 301 - 350: 7 units  CBG 351 - 400: 9 units  CBG > 400: t  insulin  glargine-yfgn (SEMGLEE ) injection 20 Units 20 Units, Subcutaneous, Daily,       Current Medications (08/13/2023):  This is the current hospital active medication list Current Facility-Administered Medications  Medication Dose Route Frequency Provider Last Rate Last Admin   acyclovir  (ZOVIRAX ) tablet 400 mg  400 mg Oral Daily Jerri Keys, MD   400 mg at 08/13/23 9061   allopurinol  (ZYLOPRIM ) tablet 150 mg  150  mg Oral Daily Jerri Keys, MD   150 mg at 08/13/23 9061   amLODipine  (NORVASC ) tablet 10 mg  10 mg Oral Daily Jillian Buttery, MD   10 mg at 08/13/23 9061   gabapentin  (NEURONTIN ) capsule 100 mg  100 mg Oral QHS Jerri Keys, MD   100 mg at 08/12/23 2110   HYDROmorphone  (DILAUDID ) injection 0.5 mg  0.5 mg Intravenous Q3H PRN Jillian Buttery, MD   0.5 mg at 08/13/23 0253   insulin  aspart (novoLOG ) injection 0-9 Units  0-9 Units Subcutaneous TID WC Jerri Keys, MD   3 Units at 08/13/23 1217   insulin  glargine-yfgn (SEMGLEE ) injection 20 Units  20 Units Subcutaneous Daily Vernon Ranks, MD   20 Units at  08/13/23 1010   lidocaine  (LIDODERM ) 5 % 1 patch  1 patch Transdermal Q24H Jerri Keys, MD   1 patch at 08/12/23 2110   oxyCODONE  (Oxy IR/ROXICODONE ) immediate release tablet 5 mg  5 mg Oral Q4H PRN Adhikari, Amrit, MD   5 mg at 08/10/23 0009   polyethylene glycol (MIRALAX  / GLYCOLAX ) packet 17 g  17 g Oral Daily Jillian Buttery, MD   17 g at 08/13/23 0938   [START ON 08/24/2023] predniSONE  (DELTASONE ) tablet 10 mg  10 mg Oral Q breakfast Rouson, Lisa J, NP       [START ON 08/22/2023] predniSONE  (DELTASONE ) tablet 20 mg  20 mg Oral Q breakfast Rouson, Lisa J, NP       [START ON 08/20/2023] predniSONE  (DELTASONE ) tablet 30 mg  30 mg Oral Q breakfast Rouson, Lisa J, NP       [START ON 08/18/2023] predniSONE  (DELTASONE ) tablet 40 mg  40 mg Oral Q breakfast Rouson, Lisa J, NP       [START ON 08/16/2023] predniSONE  (DELTASONE ) tablet 50 mg  50 mg Oral Q breakfast Rouson, Lisa J, NP       predniSONE  (DELTASONE ) tablet 60 mg  60 mg Oral Daily Rouson, Lisa J, NP   60 mg at 08/13/23 9061   senna-docusate (Senokot-S) tablet 1 tablet  1 tablet Oral BID Adhikari, Amrit, MD   1 tablet at 08/13/23 9061   simvastatin  (ZOCOR ) tablet 20 mg  20 mg Oral QHS Xu, Fang, MD   20 mg at 08/12/23 2110     Discharge Medications: Please see discharge summary for a list of discharge medications.  Relevant Imaging Results:  Relevant Lab Results:   Additional Information SSN [id number]  Doneta Glenys DASEN, RN

## 2023-08-14 DIAGNOSIS — N179 Acute kidney failure, unspecified: Secondary | ICD-10-CM | POA: Diagnosis not present

## 2023-08-14 LAB — CBC WITH DIFFERENTIAL/PLATELET
Abs Immature Granulocytes: 0.06 K/uL (ref 0.00–0.07)
Basophils Absolute: 0 K/uL (ref 0.0–0.1)
Basophils Relative: 0 %
Eosinophils Absolute: 0 K/uL (ref 0.0–0.5)
Eosinophils Relative: 0 %
HCT: 22.4 % — ABNORMAL LOW (ref 39.0–52.0)
Hemoglobin: 7.4 g/dL — ABNORMAL LOW (ref 13.0–17.0)
Immature Granulocytes: 1 %
Lymphocytes Relative: 62 %
Lymphs Abs: 4.4 K/uL — ABNORMAL HIGH (ref 0.7–4.0)
MCH: 31.4 pg (ref 26.0–34.0)
MCHC: 33 g/dL (ref 30.0–36.0)
MCV: 94.9 fL (ref 80.0–100.0)
Monocytes Absolute: 0.3 K/uL (ref 0.1–1.0)
Monocytes Relative: 4 %
Neutro Abs: 2.4 K/uL (ref 1.7–7.7)
Neutrophils Relative %: 33 %
Platelets: 41 K/uL — ABNORMAL LOW (ref 150–400)
RBC: 2.36 MIL/uL — ABNORMAL LOW (ref 4.22–5.81)
RDW: 15.4 % (ref 11.5–15.5)
WBC: 7.1 K/uL (ref 4.0–10.5)
nRBC: 0.6 % — ABNORMAL HIGH (ref 0.0–0.2)

## 2023-08-14 LAB — GLUCOSE, CAPILLARY
Glucose-Capillary: 167 mg/dL — ABNORMAL HIGH (ref 70–99)
Glucose-Capillary: 271 mg/dL — ABNORMAL HIGH (ref 70–99)
Glucose-Capillary: 266 mg/dL — ABNORMAL HIGH (ref 70–99)
Glucose-Capillary: 307 mg/dL — ABNORMAL HIGH (ref 70–99)

## 2023-08-14 MED ORDER — INSULIN GLARGINE-YFGN 100 UNIT/ML ~~LOC~~ SOLN
30.0000 [IU] | Freq: Every day | SUBCUTANEOUS | Status: DC
  Administered 2023-08-14 – 2023-08-17 (×4): 30 [IU] via SUBCUTANEOUS
  Filled 2023-08-14 (×5): qty 0.3

## 2023-08-14 NOTE — Progress Notes (Signed)
 Physical Therapy Treatment Patient Details Name: Tyler Scott MRN: [id number] DOB: Sep 01, 1955 Today's Date: 08/14/2023   History of Present Illness 68 year old male who presented with a complaint of progressive low back pain, not able to walk, decreased oral intake.  Lab work on presentation showed pancytopenia.  CT abdomen/pelvis showed progression of lymphoma lymphadenopathy with increase in bulky retroperitoneal nodal mass.  MRI of the lumbar spine showed Masslike extraosseous paraspinal signal abnormality at multiple  Levels.  Oncology consulted.  Plan for possible lymph node biopsy, Port-A-Cath placement for starting of chemotherapy.  PMHx:  insulin -dependent type type II, hypertension, CLL    PT Comments  Pt progressing toward goals, activity tolerance much improved, no incr pain after hallway ambulation and standing LE exercises.  Anticipate pt will be able to d/c home with HHPT    If plan is discharge home, recommend the following: Assistance with cooking/housework;Assist for transportation;Help with stairs or ramp for entrance;A little help with walking and/or transfers;A little help with bathing/dressing/bathroom   Can travel by private vehicle        Equipment Recommendations  Rolling walker (2 wheels)    Recommendations for Other Services       Precautions / Restrictions Precautions Precautions: Fall Recall of Precautions/Restrictions: Intact Restrictions Weight Bearing Restrictions Per Provider Order: No     Mobility  Bed Mobility               General bed mobility comments: Pt received standing in room,  recliner  at exit    Transfers Overall transfer level: Needs assistance Equipment used: Rolling walker (2 wheels) Transfers: Sit to/from Stand Sit to Stand: Modified independent (Device/Increase time)           General transfer comment: SUP for saety    Ambulation/Gait Ambulation/Gait assistance: Supervision, Modified independent  (Device/Increase time) Gait Distance (Feet): 300 Feet Assistive device: Rolling walker (2 wheels) Gait Pattern/deviations: Narrow base of support, Step-through pattern, Decreased stride length Gait velocity: decr     General Gait Details: ongoing education on proper RW position, posture. pt abel to amb short distances in room without device. no LOB   Stairs             Wheelchair Mobility     Tilt Bed    Modified Rankin (Stroke Patients Only)       Balance Overall balance assessment: Needs assistance Sitting-balance support: No upper extremity supported, Feet supported Sitting balance-Leahy Scale: Good     Standing balance support: Bilateral upper extremity supported, During functional activity Standing balance-Leahy Scale: Fair Standing balance comment: During ambulation required BUE, during standing exercise able to peform single UE             High level balance activites: Side stepping, Direction changes, Turns, Head turns High Level Balance Comments: no LOB with above, lightly reliant on RW or at least unilateral UE support for dynamic tasks            Communication Communication Communication: No apparent difficulties  Cognition Arousal: Alert Behavior During Therapy: WFL for tasks assessed/performed   PT - Cognitive impairments: No apparent impairments                         Following commands: Intact      Cueing Cueing Techniques: Verbal cues  Exercises General Exercises - Lower Extremity Hip ABduction/ADduction: AROM, Both, 10 reps, Standing Hip Flexion/Marching: AROM, Both, 10 reps, Standing    General Comments  Pertinent Vitals/Pain Pain Assessment Pain Assessment: Faces Faces Pain Scale: Hurts a little bit Pain Location: low back, R groin with activity Pain Descriptors / Indicators: Guarding, Sore Pain Intervention(s): Monitored during session, Repositioned    Home Living                           Prior Function            PT Goals (current goals can now be found in the care plan section) Acute Rehab PT Goals Patient Stated Goal: Get stronger PT Goal Formulation: With patient/family Time For Goal Achievement: 08/21/23 Potential to Achieve Goals: Good Progress towards PT goals: Progressing toward goals    Frequency    Min 3X/week      PT Plan      Co-evaluation              AM-PAC PT 6 Clicks Mobility   Outcome Measure  Help needed turning from your back to your side while in a flat bed without using bedrails?: A Little Help needed moving from lying on your back to sitting on the side of a flat bed without using bedrails?: A Little Help needed moving to and from a bed to a chair (including a wheelchair)?: A Little Help needed standing up from a chair using your arms (e.g., wheelchair or bedside chair)?: A Little Help needed to walk in hospital room?: A Little Help needed climbing 3-5 steps with a railing? : A Little 6 Click Score: 18    End of Session Equipment Utilized During Treatment: Gait belt Activity Tolerance: Patient limited by pain Patient left: with call bell/phone within reach;in chair (no alarm on arrival,pt up in room without assist) Nurse Communication: Mobility status PT Visit Diagnosis: Difficulty in walking, not elsewhere classified (R26.2);Unsteadiness on feet (R26.81);Muscle weakness (generalized) (M62.81)     Time: 8498-8478 PT Time Calculation (min) (ACUTE ONLY): 20 min  Charges:    $Gait Training: 8-22 mins PT General Charges $$ ACUTE PT VISIT: 1 Visit                     Suliman Termini, PT  Acute Rehab Dept ([facility]) [phone]  08/14/2023    [facility] 08/14/2023, 3:28 PM

## 2023-08-14 NOTE — Progress Notes (Signed)
 PROGRESS NOTE    Tyler Scott  FMW:[id number] DOB: 1956/01/30 DOA: 08/06/2023 PCP: [name], MD   Brief Narrative:  Patient is a 68 year old male with history of insulin -dependent type type II, hypertension, CLL who presented with a complaint of progressive low back pain, not able to walk, decreased oral intake.  Lab work on presentation showed pancytopenia.  CT abdomen/pelvis showed  Progression of lymphoma lymphadenopathy with increase in bulky retroperitoneal nodal mass.  MRI of the lumbar spine showed Masslike extraosseous paraspinal signal abnormality at multiple levels.  Oncology consulted and following.  S/P  lymph node biopsy  Assessment & Plan:   Principal Problem:   AKI (acute kidney injury) (HCC) Active Problems:   CLL (chronic lymphocytic leukemia) (HCC)   Insulin  dependent type 2 diabetes mellitus (HCC)   Pancytopenia (HCC)   Lymphoma (HCC)  CLL/pancytopenia/SLL: Presented with low back pain.CT abdomen/pelvis showed  progression of lymphoma lymphadenopathy with increase in bulky retroperitoneal nodal mass.  MRI of the lumbar spine showed masslike extraosseous paraspinal signal abnormality at multiple levels.  History of CLL, was on different therapies.  Has adenopathy in the inguinal area.  Oncology suspecting transformation of CLL to aggressive lymphoma.  Suspicion of bone marrow involvement.  Given rasburicase   for high uric acid  level.  Oncology eventually planning for starting chemotherapy, Port-A-Cath placement. S/P retroperitoneal LN biopsy on 6/30.  Prelim path shows low-grade lymphoma.  Patient was started on 2 doses of IVIG and high-dose of prednisone  with recommendations to taper after few days.  Hemoglobin was 6.5 on 08/11/2023 for which she was given 1 unit of PRBC transfusion, hemoglobin improved to over 9 posttransfusion however it keeps dropping and 7.4 today.  Check daily and transfuse if less than 7.   AKI: No obstructive pathology as per CT  abdomen/pelvis.  UA unremarkable.  Hydrated.  Renal function normalized.   Hyponatremia: Received gentle hydration with normal saline yesterday, sodium improved to 132 yesterday but down to 129 yesterday, will recheck tomorrow.   Hypertension: Taking lisinopril  at home.  Started on amlodipine  due to AKI.  Blood pressure controlled.   Insulin -dependent diabetes type 2: A1c of 5.8.  Currently on 20 units of Lantus  and SSI.  Was still slightly hyperglycemic last night so we will increase Lantus  to 30 units.   Failure to thrive/generalized weakness: PT consulted, recommend CIR however, Per CIR, they spoke with patient's wife by phone and wife works a lot and does not have anyone who can stay with the patient after potential GIST and wife wants him to go to SNF.  TOC on board, pending placement.   Constipation: Continue current management.  No further constipation.  Generalized weakness/disposition: PT OT recommended CIR, patient was declined, now pursuing SNF.  Medically stable, pending placement to SNF.  TOC working on it.  DVT prophylaxis: SCDs Start: 08/11/23 0936 Place and maintain sequential compression device Start: 08/07/23 1058 Place and maintain sequential compression device Start: 08/07/23 1058   Code Status: Full Code  Family Communication:  None present at bedside.  Plan of care discussed with patient in length and he/she verbalized understanding and agreed with it.  Status is: Inpatient Remains inpatient appropriate because: Now medically stable for discharge, pending placement to SNF.   Estimated body mass index is 22.05 kg/m as calculated from the following:   Height as of this encounter: 5' 8 (1.727 m).   Weight as of this encounter: 65.8 kg.    Nutritional Assessment: Body mass index is 22.05 kg/m.SABRA Seen  by dietician.  I agree with the assessment and plan as outlined below: Nutrition Status:        . Skin Assessment: I have examined the patient's skin and I  agree with the wound assessment as performed by the wound care RN as outlined below:    Consultants:  Oncology  Procedures:  As above  Antimicrobials:  Anti-infectives (From admission, onward)    Start     Dose/Rate Route Frequency Ordered Stop   08/11/23 0000  acyclovir  (ZOVIRAX ) 400 MG tablet        400 mg Oral Daily 08/11/23 2345     08/06/23 2145  acyclovir  (ZOVIRAX ) tablet 400 mg        400 mg Oral Daily 08/06/23 2133           Subjective: Seen and examined.  He has no complaint today.  Objective: Vitals:   08/13/23 0309 08/13/23 1357 08/13/23 2028 08/14/23 0419  BP: 136/70 123/69 117/61 (!) 149/74  Pulse: 91 88 85 77  Resp: 18 16 17 17   Temp: 97.7 F (36.5 C) 97.7 F (36.5 C) 97.8 F (36.6 C) 98.4 F (36.9 C)  TempSrc:  Oral  Oral  SpO2: 100% 100% 100% 100%  Weight:      Height:        Intake/Output Summary (Last 24 hours) at 08/14/2023 0758 Last data filed at 08/14/2023 0419 Gross per 24 hour  Intake 480 ml  Output 300 ml  Net 180 ml   Filed Weights   08/06/23 1506  Weight: 65.8 kg    Examination:  General exam: Appears calm and comfortable  Respiratory system: Clear to auscultation. Respiratory effort normal. Cardiovascular system: S1 & S2 heard, RRR. No JVD, murmurs, rubs, gallops or clicks. No pedal edema. Gastrointestinal system: Abdomen is nondistended, soft and nontender. No organomegaly or masses felt. Normal bowel sounds heard. Central nervous system: Alert and oriented. No focal neurological deficits. Extremities: Symmetric 5 x 5 power. Skin: No rashes, lesions or ulcers.  Psychiatry: Judgement and insight appear normal. Mood & affect appropriate.   Data Reviewed: I have personally reviewed following labs and imaging studies  CBC: Recent Labs  Lab 08/10/23 0450 08/11/23 0444 08/11/23 1419 08/12/23 1100 08/13/23 0428 08/14/23 0414  WBC 3.0* 2.5* 5.2 7.4 5.3 7.1  NEUTROABS 1.5* 0.9*  --  2.5 2.0 2.4  HGB 7.7* 6.5* 9.2* 9.7* 7.9*  7.4*  HCT 23.7* 20.2* 27.8* 29.0* 24.0* 22.4*  MCV 98.8 99.0 95.9 96.3 95.6 94.9  PLT 29* 27* 40* 51* 37* 41*   Basic Metabolic Panel: Recent Labs  Lab 08/09/23 0458 08/10/23 0450 08/11/23 0444 08/11/23 1419 08/13/23 0428  NA 133* 128* 134* 132* 129*  K 4.2 4.3 3.8 4.1 4.2  CL 103 98 107 98 98  CO2 22 21* 20* 23 22  GLUCOSE 152* 157* 120* 199* 291*  BUN 28* 23 19 20  34*  CREATININE 1.03 0.94 0.80 0.97 0.81  CALCIUM 9.2 9.3 8.7* 9.8 8.6*   GFR: Estimated Creatinine Clearance: 82.4 mL/min (by C-G formula based on SCr of 0.81 mg/dL). Liver Function Tests: Recent Labs  Lab 08/08/23 0506 08/09/23 0458 08/10/23 0450 08/11/23 0444 08/11/23 1419  AST 16 14* 15 13* 17  ALT 8 8 8 8 8   ALKPHOS 65 63 61 53 71  BILITOT 1.1 1.0 1.2 0.8 1.4*  PROT 6.1* 5.9* 6.0* 5.3* 7.3  ALBUMIN 3.4* 3.2* 3.2* 2.8* 3.7   No results for input(s): LIPASE, AMYLASE in the last 168 hours.  No results for input(s): AMMONIA in the last 168 hours. Coagulation Profile: Recent Labs  Lab 08/08/23 0506  INR 1.0   Cardiac Enzymes: No results for input(s): CKTOTAL, CKMB, CKMBINDEX, TROPONINI in the last 168 hours. BNP (last 3 results) No results for input(s): PROBNP in the last 8760 hours. HbA1C: No results for input(s): HGBA1C in the last 72 hours. CBG: Recent Labs  Lab 08/13/23 1641 08/13/23 2036 08/13/23 2230 08/13/23 2354 08/14/23 0754  GLUCAP 309* 434* 353* 289* 167*   Lipid Profile: No results for input(s): CHOL, HDL, LDLCALC, TRIG, CHOLHDL, LDLDIRECT in the last 72 hours. Thyroid  Function Tests: No results for input(s): TSH, T4TOTAL, FREET4, T3FREE, THYROIDAB in the last 72 hours. Anemia Panel: No results for input(s): VITAMINB12, FOLATE, FERRITIN, TIBC, IRON, RETICCTPCT in the last 72 hours. Sepsis Labs: No results for input(s): PROCALCITON, LATICACIDVEN in the last 168 hours.  Recent Results (from the past 240 hours)   Culture, blood (Routine X 2) w Reflex to ID Panel     Status: None   Collection Time: 08/07/23 12:44 AM   Specimen: BLOOD RIGHT ARM  Result Value Ref Range Status   Specimen Description   Final    BLOOD RIGHT ARM Performed at [facility], [address], [place], [place] [address]    Special Requests   Final    BOTTLES DRAWN AEROBIC AND ANAEROBIC Blood Culture adequate volume Performed at [facility], [address], [place], [place] [address]    Culture   Final    NO GROWTH 5 DAYS Performed at [facility], [address], [place], [place] [address]    Report Status 08/12/2023 FINAL  Final  Culture, blood (Routine X 2) w Reflex to ID Panel     Status: None   Collection Time: 08/07/23 12:44 AM   Specimen: BLOOD LEFT ARM  Result Value Ref Range Status   Specimen Description   Final    BLOOD LEFT ARM Performed at [facility], [address], [place], [place] [address]    Special Requests   Final    BOTTLES DRAWN AEROBIC AND ANAEROBIC Blood Culture adequate volume Performed at [facility], [address], [place], [place] [address]    Culture   Final    NO GROWTH 5 DAYS Performed at [facility], [address], [place], [place] [address]    Report Status 08/12/2023 FINAL  Final     Radiology Studies: No results found.   Scheduled Meds:  acyclovir   400 mg Oral Daily   allopurinol   150 mg Oral Daily   amLODipine   10 mg Oral Daily   gabapentin   100 mg Oral QHS   insulin  aspart  0-9 Units Subcutaneous TID WC   insulin  glargine-yfgn  20 Units Subcutaneous Daily   lidocaine   1 patch Transdermal Q24H   polyethylene glycol  17 g Oral Daily   [START ON 08/24/2023] predniSONE   10 mg Oral Q breakfast   [START ON 08/22/2023] predniSONE   20 mg Oral Q breakfast   [START ON 08/20/2023] predniSONE   30 mg Oral Q breakfast   [START ON 08/18/2023] predniSONE   40 mg Oral Q breakfast   [START ON 08/16/2023] predniSONE   50 mg  Oral Q breakfast   predniSONE   60 mg Oral Daily   senna-docusate  1 tablet Oral BID   simvastatin   20 mg Oral QHS   Continuous Infusions:     LOS: 8 days  Fredia Skeeter, MD [facility]  08/14/2023, 7:58 AM   *Please note that this is a verbal dictation therefore any spelling or grammatical errors are due to the Dragon Medical One system interpretation.  Please page via Amion and do not message via secure chat for urgent patient care matters. Secure chat can be used for non urgent patient care matters.  How to contact the [facility] Attending or Consulting provider 7A - 7P or covering provider during after hours 7P -7A, for this patient?  Check the care team in [facility] and look for a) attending/consulting [facility] provider listed and b) the [facility] team listed. Page or secure chat 7A-7P. Log into [email or web address] and use [place]'s universal password to access. If you do not have the password, please contact the hospital operator. Locate the [facility] provider you are looking for under [facility] and page to a number that you can be directly reached. If you still have difficulty reaching the provider, please page the [facility] (Director on Call) for the Hospitalists listed on amion for assistance.

## 2023-08-15 DIAGNOSIS — N179 Acute kidney failure, unspecified: Secondary | ICD-10-CM | POA: Diagnosis not present

## 2023-08-15 LAB — CBC WITH DIFFERENTIAL/PLATELET
Abs Immature Granulocytes: 0.12 K/uL — ABNORMAL HIGH (ref 0.00–0.07)
Basophils Absolute: 0 K/uL (ref 0.0–0.1)
Basophils Relative: 0 %
Eosinophils Absolute: 0 K/uL (ref 0.0–0.5)
Eosinophils Relative: 0 %
HCT: 23.3 % — ABNORMAL LOW (ref 39.0–52.0)
Hemoglobin: 7.8 g/dL — ABNORMAL LOW (ref 13.0–17.0)
Immature Granulocytes: 1 %
Lymphocytes Relative: 65 %
Lymphs Abs: 5.6 K/uL — ABNORMAL HIGH (ref 0.7–4.0)
MCH: 32.1 pg (ref 26.0–34.0)
MCHC: 33.5 g/dL (ref 30.0–36.0)
MCV: 95.9 fL (ref 80.0–100.0)
Monocytes Absolute: 0.4 K/uL (ref 0.1–1.0)
Monocytes Relative: 4 %
Neutro Abs: 2.6 K/uL (ref 1.7–7.7)
Neutrophils Relative %: 30 %
Platelets: 49 K/uL — ABNORMAL LOW (ref 150–400)
RBC: 2.43 MIL/uL — ABNORMAL LOW (ref 4.22–5.81)
RDW: 15.4 % (ref 11.5–15.5)
WBC: 8.7 K/uL (ref 4.0–10.5)
nRBC: 0.3 % — ABNORMAL HIGH (ref 0.0–0.2)

## 2023-08-15 LAB — BASIC METABOLIC PANEL WITH GFR
Anion gap: 10 (ref 5–15)
BUN: 30 mg/dL — ABNORMAL HIGH (ref 8–23)
CO2: 23 mmol/L (ref 22–32)
Calcium: 8.5 mg/dL — ABNORMAL LOW (ref 8.9–10.3)
Chloride: 100 mmol/L (ref 98–111)
Creatinine, Ser: 0.75 mg/dL (ref 0.61–1.24)
GFR, Estimated: >60 mL/min (ref >60)
Glucose, Bld: 270 mg/dL — ABNORMAL HIGH (ref 70–99)
Potassium: 4 mmol/L (ref 3.5–5.1)
Sodium: 133 mmol/L — ABNORMAL LOW (ref 135–145)

## 2023-08-15 LAB — GLUCOSE, CAPILLARY
Glucose-Capillary: 170 mg/dL — ABNORMAL HIGH (ref 70–99)
Glucose-Capillary: 321 mg/dL — ABNORMAL HIGH (ref 70–99)
Glucose-Capillary: 328 mg/dL — ABNORMAL HIGH (ref 70–99)
Glucose-Capillary: 292 mg/dL — ABNORMAL HIGH (ref 70–99)

## 2023-08-15 NOTE — Plan of Care (Signed)
  Problem: Health Behavior/Discharge Planning: Goal: Ability to identify and utilize available resources and services will improve Outcome: Progressing Goal: Ability to manage health-related needs will improve Outcome: Progressing   Problem: Metabolic: Goal: Ability to maintain appropriate glucose levels will improve Outcome: Progressing   Problem: Nutritional: Goal: Maintenance of adequate nutrition will improve Outcome: Progressing   Problem: Nutrition: Goal: Adequate nutrition will be maintained Outcome: Progressing

## 2023-08-15 NOTE — Plan of Care (Signed)
  Problem: Metabolic: Goal: Ability to maintain appropriate glucose levels will improve Outcome: Progressing   Problem: Clinical Measurements: Goal: Ability to maintain clinical measurements within normal limits will improve Outcome: Progressing Goal: Diagnostic test results will improve Outcome: Progressing   Problem: Pain Managment: Goal: General experience of comfort will improve and/or be controlled Outcome: Progressing

## 2023-08-15 NOTE — Progress Notes (Signed)
 PROGRESS NOTE    Tyler Scott  FMW:[id number] DOB: 1955/07/25 DOA: 08/06/2023 PCP: [name], MD   Brief Narrative:  Patient is a 68 year old male with history of insulin -dependent type type II, hypertension, CLL who presented with a complaint of progressive low back pain, not able to walk, decreased oral intake.  Lab work on presentation showed pancytopenia.  CT abdomen/pelvis showed  Progression of lymphoma lymphadenopathy with increase in bulky retroperitoneal nodal mass.  MRI of the lumbar spine showed Masslike extraosseous paraspinal signal abnormality at multiple levels.  Oncology consulted and following.  S/P  lymph node biopsy  Assessment & Plan:   Principal Problem:   AKI (acute kidney injury) (HCC) Active Problems:   CLL (chronic lymphocytic leukemia) (HCC)   Insulin  dependent type 2 diabetes mellitus (HCC)   Pancytopenia (HCC)   Lymphoma (HCC)  CLL/pancytopenia/SLL: Presented with low back pain.CT abdomen/pelvis showed  progression of lymphoma lymphadenopathy with increase in bulky retroperitoneal nodal mass.  MRI of the lumbar spine showed masslike extraosseous paraspinal signal abnormality at multiple levels.  History of CLL, was on different therapies.  Has adenopathy in the inguinal area.  Oncology suspecting transformation of CLL to aggressive lymphoma.  Suspicion of bone marrow involvement.  Given rasburicase   for high uric acid  level.  Oncology eventually planning for starting chemotherapy, Port-A-Cath placement. S/P retroperitoneal LN biopsy on 6/30.  Prelim path shows low-grade lymphoma.  Patient was started on 2 doses of IVIG and high-dose of prednisone  with recommendations to taper after few days.  Hemoglobin was 6.5 on 08/11/2023 for which she was given 1 unit of PRBC transfusion, hemoglobin improved to over 9 posttransfusion however it keeps dropping and 7.8 today.  Check daily and transfuse if less than 7.   AKI: No obstructive pathology as per CT  abdomen/pelvis.  UA unremarkable.  Hydrated.  Renal function normalized.   Hyponatremia: Received gentle hydration with normal saline yesterday, sodium improved to 132 yesterday but down to 133.   Hypertension: Taking lisinopril  at home.  Started on amlodipine  due to AKI.  Blood pressure controlled.   Insulin -dependent diabetes type 2: A1c of 5.8.  Currently on 20 units of Lantus  and SSI.  Was still slightly hyperglycemic last night so we will increase Lantus  to 30 units.   Failure to thrive/generalized weakness: PT consulted, recommend CIR however, Per CIR, they spoke with patient's wife by phone and wife works a lot and does not have anyone who can stay with the patient after potential GIST and wife wants him to go to SNF.  TOC on board, pending placement.   Constipation: Continue current management.  No further constipation.  Generalized weakness/disposition: PT OT recommended CIR, patient was declined, now pursuing SNF.  Medically stable, pending placement to SNF.  TOC working on it.  DVT prophylaxis: SCDs Start: 08/11/23 0936 Place and maintain sequential compression device Start: 08/07/23 1058 Place and maintain sequential compression device Start: 08/07/23 1058   Code Status: Full Code  Family Communication:  None present at bedside.  Plan of care discussed with patient in length and he/she verbalized understanding and agreed with it.  Status is: Inpatient Remains inpatient appropriate because: Now medically stable for discharge, pending placement to SNF.   Estimated body mass index is 22.05 kg/m as calculated from the following:   Height as of this encounter: 5' 8 (1.727 m).   Weight as of this encounter: 65.8 kg.    Nutritional Assessment: Body mass index is 22.05 kg/m.SABRA Seen by dietician.  I  agree with the assessment and plan as outlined below: Nutrition Status:        . Skin Assessment: I have examined the patient's skin and I agree with the wound assessment as  performed by the wound care RN as outlined below:    Consultants:  Oncology  Procedures:  As above  Antimicrobials:  Anti-infectives (From admission, onward)    Start     Dose/Rate Route Frequency Ordered Stop   08/11/23 0000  acyclovir  (ZOVIRAX ) 400 MG tablet        400 mg Oral Daily 08/11/23 2345     08/06/23 2145  acyclovir  (ZOVIRAX ) tablet 400 mg        400 mg Oral Daily 08/06/23 2133           Subjective: Seen and examined.  No complaints.  Objective: Vitals:   08/14/23 0419 08/14/23 1239 08/14/23 1939 08/15/23 0515  BP: (!) 149/74 124/74 136/67 (!) 153/72  Pulse: 77 84 90 78  Resp: 17 16 18 18   Temp: 98.4 F (36.9 C) (!) 97.4 F (36.3 C) 97.8 F (36.6 C) 97.8 F (36.6 C)  TempSrc: Oral Oral Oral Oral  SpO2: 100% 100% 100% 100%  Weight:      Height:       No intake or output data in the 24 hours ending 08/15/23 0754  Filed Weights   08/06/23 1506  Weight: 65.8 kg    Examination:  General exam: Appears calm and comfortable  Respiratory system: Clear to auscultation. Respiratory effort normal. Cardiovascular system: S1 & S2 heard, RRR. No JVD, murmurs, rubs, gallops or clicks. No pedal edema. Gastrointestinal system: Abdomen is nondistended, soft and nontender. No organomegaly or masses felt. Normal bowel sounds heard. Central nervous system: Alert and oriented. No focal neurological deficits. Extremities: Symmetric 5 x 5 power. Skin: No rashes, lesions or ulcers.  Psychiatry: Judgement and insight appear normal. Mood & affect appropriate.   Data Reviewed: I have personally reviewed following labs and imaging studies  CBC: Recent Labs  Lab 08/11/23 0444 08/11/23 1419 08/12/23 1100 08/13/23 0428 08/14/23 0414 08/15/23 0412  WBC 2.5* 5.2 7.4 5.3 7.1 8.7  NEUTROABS 0.9*  --  2.5 2.0 2.4 2.6  HGB 6.5* 9.2* 9.7* 7.9* 7.4* 7.8*  HCT 20.2* 27.8* 29.0* 24.0* 22.4* 23.3*  MCV 99.0 95.9 96.3 95.6 94.9 95.9  PLT 27* 40* 51* 37* 41* 49*   Basic  Metabolic Panel: Recent Labs  Lab 08/10/23 0450 08/11/23 0444 08/11/23 1419 08/13/23 0428 08/15/23 0412  NA 128* 134* 132* 129* 133*  K 4.3 3.8 4.1 4.2 4.0  CL 98 107 98 98 100  CO2 21* 20* 23 22 23   GLUCOSE 157* 120* 199* 291* 270*  BUN 23 19 20  34* 30*  CREATININE 0.94 0.80 0.97 0.81 0.75  CALCIUM 9.3 8.7* 9.8 8.6* 8.5*   GFR: Estimated Creatinine Clearance: 83.4 mL/min (by C-G formula based on SCr of 0.75 mg/dL). Liver Function Tests: Recent Labs  Lab 08/09/23 0458 08/10/23 0450 08/11/23 0444 08/11/23 1419  AST 14* 15 13* 17  ALT 8 8 8 8   ALKPHOS 63 61 53 71  BILITOT 1.0 1.2 0.8 1.4*  PROT 5.9* 6.0* 5.3* 7.3  ALBUMIN 3.2* 3.2* 2.8* 3.7   No results for input(s): LIPASE, AMYLASE in the last 168 hours. No results for input(s): AMMONIA in the last 168 hours. Coagulation Profile: No results for input(s): INR, PROTIME in the last 168 hours.  Cardiac Enzymes: No results for input(s): CKTOTAL, CKMB, CKMBINDEX,  TROPONINI in the last 168 hours. BNP (last 3 results) No results for input(s): PROBNP in the last 8760 hours. HbA1C: No results for input(s): HGBA1C in the last 72 hours. CBG: Recent Labs  Lab 08/14/23 0754 08/14/23 1237 08/14/23 1622 08/14/23 1943 08/15/23 0738  GLUCAP 167* 271* 266* 307* 170*   Lipid Profile: No results for input(s): CHOL, HDL, LDLCALC, TRIG, CHOLHDL, LDLDIRECT in the last 72 hours. Thyroid  Function Tests: No results for input(s): TSH, T4TOTAL, FREET4, T3FREE, THYROIDAB in the last 72 hours. Anemia Panel: No results for input(s): VITAMINB12, FOLATE, FERRITIN, TIBC, IRON, RETICCTPCT in the last 72 hours. Sepsis Labs: No results for input(s): PROCALCITON, LATICACIDVEN in the last 168 hours.  Recent Results (from the past 240 hours)  Culture, blood (Routine X 2) w Reflex to ID Panel     Status: None   Collection Time: 08/07/23 12:44 AM   Specimen: BLOOD RIGHT ARM  Result  Value Ref Range Status   Specimen Description   Final    BLOOD RIGHT ARM Performed at [facility], [address] [place], [place] [address]    Special Requests   Final    BOTTLES DRAWN AEROBIC AND ANAEROBIC Blood Culture adequate volume Performed at [facility], [address], [place], [place] [address]    Culture   Final    NO GROWTH 5 DAYS Performed at [facility], [address], [place], [place] [address]    Report Status 08/12/2023 FINAL  Final  Culture, blood (Routine X 2) w Reflex to ID Panel     Status: None   Collection Time: 08/07/23 12:44 AM   Specimen: BLOOD LEFT ARM  Result Value Ref Range Status   Specimen Description   Final    BLOOD LEFT ARM Performed at [facility], [address], [place], [place] [address]    Special Requests   Final    BOTTLES DRAWN AEROBIC AND ANAEROBIC Blood Culture adequate volume Performed at [facility], [address] [place], [place] [address]    Culture   Final    NO GROWTH 5 DAYS Performed at [facility], [address], [place], [place] [address]    Report Status 08/12/2023 FINAL  Final     Radiology Studies: No results found.   Scheduled Meds:  acyclovir   400 mg Oral Daily   allopurinol   150 mg Oral Daily   amLODipine   10 mg Oral Daily   gabapentin   100 mg Oral QHS   insulin  aspart  0-9 Units Subcutaneous TID WC   insulin  glargine-yfgn  30 Units Subcutaneous Daily   lidocaine   1 patch Transdermal Q24H   polyethylene glycol  17 g Oral Daily   [START ON 08/24/2023] predniSONE   10 mg Oral Q breakfast   [START ON 08/22/2023] predniSONE   20 mg Oral Q breakfast   [START ON 08/20/2023] predniSONE   30 mg Oral Q breakfast   [START ON 08/18/2023] predniSONE   40 mg Oral Q breakfast   [START ON 08/16/2023] predniSONE   50 mg Oral Q breakfast   predniSONE   60 mg Oral Daily   senna-docusate  1 tablet Oral BID   simvastatin   20 mg Oral QHS   Continuous  Infusions:     LOS: 9 days   Fredia Skeeter, MD [facility]  08/15/2023, 7:54 AM   *Please note that this is a verbal dictation therefore any spelling or grammatical errors are due to the Dragon Medical One  system interpretation.  Please page via Amion and do not message via secure chat for urgent patient care matters. Secure chat can be used for non urgent patient care matters.  How to contact the TRH Attending or Consulting provider 7A - 7P or covering provider during after hours 7P -7A, for this patient?  Check the care team in [facility] and look for a) attending/consulting TRH provider listed and b) the TRH team listed. Page or secure chat 7A-7P. Log into [email or web address] and use [place]'s universal password to access. If you do not have the password, please contact the hospital operator. Locate the TRH provider you are looking for under [facility] and page to a number that you can be directly reached. If you still have difficulty reaching the provider, please page the [facility] (Director on Call) for the Hospitalists listed on amion for assistance.

## 2023-08-15 NOTE — TOC Progression Note (Signed)
 Transition of Care [facility]) - Progression Note    Patient Details  Name: Tyler Scott MRN: [id number] Date of Birth: 02-04-1956  Transition of [facility]) CM/SW Contact  Sheri ONEIDA [place], [place] Phone Number: 08/15/2023, 12:57 PM  Clinical Narrative:    CSW reviewed bed offers with pt. Pt considering [facility]. Pt requested additional time to discuss with spouse. Pt is concerned about transportation to possible chemo appt.during rehab stay. TOC to follow up on bed choice.    Expected Discharge Plan: Home w Home Health Services Barriers to Discharge: Continued Medical Work up  Expected Discharge Plan and Services In-house Referral: NA Discharge Planning Services: CM Consult   Living arrangements for the past 2 months: Single Family Home                   DME Agency: NA       HH Arranged: NA HH Agency: NA         Social Determinants of Health (SDOH) Interventions SDOH Screenings   Food Insecurity: No Food Insecurity (08/06/2023)  Housing: Low Risk  (08/10/2023)  Transportation Needs: No Transportation Needs (08/06/2023)  Utilities: Not At Risk (08/06/2023)  Social Connections: Socially Isolated (08/06/2023)  Tobacco Use: Low Risk  (08/06/2023)    Readmission Risk Interventions    08/07/2023    8:49 AM  Readmission Risk Prevention Plan  Transportation Screening Complete  PCP or Specialist Appt within 5-7 Days Complete  Home Care Screening Complete  Medication Review (RN CM) Complete

## 2023-08-16 ENCOUNTER — Other Ambulatory Visit: Payer: Self-pay

## 2023-08-16 DIAGNOSIS — D61818 Other pancytopenia: Secondary | ICD-10-CM | POA: Diagnosis not present

## 2023-08-16 DIAGNOSIS — C8590 Non-Hodgkin lymphoma, unspecified, unspecified site: Secondary | ICD-10-CM | POA: Diagnosis not present

## 2023-08-16 DIAGNOSIS — N179 Acute kidney failure, unspecified: Secondary | ICD-10-CM | POA: Diagnosis not present

## 2023-08-16 DIAGNOSIS — C9110 Chronic lymphocytic leukemia of B-cell type not having achieved remission: Secondary | ICD-10-CM

## 2023-08-16 LAB — CBC WITH DIFFERENTIAL/PLATELET
Abs Immature Granulocytes: 0.11 K/uL — ABNORMAL HIGH (ref 0.00–0.07)
Basophils Absolute: 0.1 K/uL (ref 0.0–0.1)
Basophils Relative: 1 %
Eosinophils Absolute: 0 K/uL (ref 0.0–0.5)
Eosinophils Relative: 0 %
HCT: 24.7 % — ABNORMAL LOW (ref 39.0–52.0)
Hemoglobin: 8.1 g/dL — ABNORMAL LOW (ref 13.0–17.0)
Immature Granulocytes: 1 %
Lymphocytes Relative: 61 %
Lymphs Abs: 6.3 K/uL — ABNORMAL HIGH (ref 0.7–4.0)
MCH: 31.5 pg (ref 26.0–34.0)
MCHC: 32.8 g/dL (ref 30.0–36.0)
MCV: 96.1 fL (ref 80.0–100.0)
Monocytes Absolute: 1.2 K/uL — ABNORMAL HIGH (ref 0.1–1.0)
Monocytes Relative: 11 %
Neutro Abs: 2.7 K/uL (ref 1.7–7.7)
Neutrophils Relative %: 26 %
Platelets: 54 K/uL — ABNORMAL LOW (ref 150–400)
RBC: 2.57 MIL/uL — ABNORMAL LOW (ref 4.22–5.81)
RDW: 15.3 % (ref 11.5–15.5)
WBC: 10.3 K/uL (ref 4.0–10.5)
nRBC: 0.6 % — ABNORMAL HIGH (ref 0.0–0.2)

## 2023-08-16 LAB — GLUCOSE, CAPILLARY
Glucose-Capillary: 103 mg/dL — ABNORMAL HIGH (ref 70–99)
Glucose-Capillary: 134 mg/dL — ABNORMAL HIGH (ref 70–99)
Glucose-Capillary: 311 mg/dL — ABNORMAL HIGH (ref 70–99)
Glucose-Capillary: 271 mg/dL — ABNORMAL HIGH (ref 70–99)
Glucose-Capillary: 276 mg/dL — ABNORMAL HIGH (ref 70–99)

## 2023-08-16 MED ORDER — MENTHOL 3 MG MT LOZG
1.0000 | LOZENGE | OROMUCOSAL | Status: DC | PRN
  Filled 2023-08-16: qty 9

## 2023-08-16 MED ORDER — PROCHLORPERAZINE MALEATE 10 MG PO TABS: 10.0000 mg | ORAL_TABLET | Freq: Four times a day (QID) | ORAL | 1 refills | Status: DC | PRN

## 2023-08-16 MED ORDER — ALLOPURINOL 300 MG PO TABS: 150.0000 mg | ORAL_TABLET | Freq: Every day | ORAL | 3 refills | Status: DC

## 2023-08-16 MED ORDER — DEXAMETHASONE 4 MG PO TABS: 8.0000 mg | ORAL_TABLET | Freq: Every day | ORAL | 1 refills | Status: DC

## 2023-08-16 MED ORDER — ACYCLOVIR 400 MG PO TABS
400.0000 mg | ORAL_TABLET | Freq: Every day | ORAL | 3 refills | Status: DC
Start: 2023-08-16 — End: 2023-11-08

## 2023-08-16 MED ORDER — ONDANSETRON HCL 8 MG PO TABS: 8.0000 mg | ORAL_TABLET | Freq: Three times a day (TID) | ORAL | 1 refills | Status: DC | PRN

## 2023-08-16 MED FILL — Immune Globulin (Human) IV Soln 40 GM/400ML: INTRAVENOUS | Qty: 400 | Status: AC

## 2023-08-16 MED FILL — Immune Globulin (Human) IV Soln 20 GM/200ML: INTRAVENOUS | Qty: 200 | Status: AC

## 2023-08-16 MED FILL — Immune Globulin (Human) IV Soln 40 GM/400ML: INTRAVENOUS | Qty: 400 | Status: CN

## 2023-08-16 NOTE — Progress Notes (Signed)
 PROGRESS NOTE    Tyler Scott  FMW:[id number] DOB: Nov 24, 1955 DOA: 08/06/2023 PCP: [name], MD   Brief Narrative:  Patient is a 68 year old male with history of insulin -dependent type type II, hypertension, CLL who presented with a complaint of progressive low back pain, not able to walk, decreased oral intake.  Lab work on presentation showed pancytopenia.  CT abdomen/pelvis showed  Progression of lymphoma lymphadenopathy with increase in bulky retroperitoneal nodal mass.  MRI of the lumbar spine showed Masslike extraosseous paraspinal signal abnormality at multiple levels.  Oncology consulted and following.  S/P  lymph node biopsy  Assessment & Plan:   Principal Problem:   AKI (acute kidney injury) (HCC) Active Problems:   CLL (chronic lymphocytic leukemia) (HCC)   Insulin  dependent type 2 diabetes mellitus (HCC)   Pancytopenia (HCC)   Lymphoma (HCC)  CLL/pancytopenia/SLL: Presented with low back pain.CT abdomen/pelvis showed  progression of lymphoma lymphadenopathy with increase in bulky retroperitoneal nodal mass.  MRI of the lumbar spine showed masslike extraosseous paraspinal signal abnormality at multiple levels.  History of CLL, was on different therapies.  Has adenopathy in the inguinal area.  Oncology suspecting transformation of CLL to aggressive lymphoma.  Suspicion of bone marrow involvement.  Given rasburicase   for high uric acid  level.  Oncology eventually planning for starting chemotherapy, Port-A-Cath placement. S/P retroperitoneal LN biopsy on 6/30.  Prelim path shows low-grade lymphoma.  Patient was started on 2 doses of IVIG and high-dose of prednisone  with recommendations to taper after few days.  Hemoglobin was 6.5 on 08/11/2023 for which she was given 1 unit of PRBC transfusion, hemoglobin improved to over 9 posttransfusion and then dropped to 7.4 but improved to 8.1 today without transfusion.  Check daily and transfuse if less than 7.   AKI: No  obstructive pathology as per CT abdomen/pelvis.  UA unremarkable.  Hydrated.  Renal function normalized.   Hyponatremia: Received gentle hydration with normal saline yesterday, sodium improved to 132 yesterday but down to 133.   Hypertension: Taking lisinopril  at home.  Started on amlodipine  due to AKI.  Blood pressure controlled.   Insulin -dependent diabetes type 2: A1c of 5.8.  Currently on 20 units of Lantus  and SSI.  Was still slightly hyperglycemic last night so we will increase Lantus  to 30 units.   Failure to thrive/generalized weakness: PT consulted, recommend CIR however, Per CIR, they spoke with patient's wife by phone and wife works a lot and does not have anyone who can stay with the patient after potential GIST and wife wants him to go to SNF.  TOC on board, pending placement.   Constipation: Continue current management.  No further constipation.  Generalized weakness/disposition: PT OT recommended CIR, patient was declined, now pursuing SNF.  Medically stable, pending placement to SNF.  TOC working on it.  Per TOC, patient has been given bed offers and he has to slide twice.  Per patient, he has selected 2 of those.  I have sent a message to the [facility] to expedite discharge to SNF.  DVT prophylaxis: SCDs Start: 08/11/23 0936 Place and maintain sequential compression device Start: 08/07/23 1058 Place and maintain sequential compression device Start: 08/07/23 1058   Code Status: Full Code  Family Communication:  None present at bedside.  Plan of care discussed with patient in length and he/she verbalized understanding and agreed with it.  Status is: Inpatient Remains inpatient appropriate because:  medically stable for discharge, pending placement to SNF.   Estimated body mass index is  22.05 kg/m as calculated from the following:   Height as of this encounter: 5' 8 (1.727 m).   Weight as of this encounter: 65.8 kg.    Nutritional Assessment: Body mass index is 22.05  kg/m.SABRA Seen by dietician.  I agree with the assessment and plan as outlined below: Nutrition Status:        . Skin Assessment: I have examined the patient's skin and I agree with the wound assessment as performed by the wound care RN as outlined below:    Consultants:  Oncology  Procedures:  As above  Antimicrobials:  Anti-infectives (From admission, onward)    Start     Dose/Rate Route Frequency Ordered Stop   08/11/23 0000  acyclovir  (ZOVIRAX ) 400 MG tablet        400 mg Oral Daily 08/11/23 2345     08/06/23 2145  acyclovir  (ZOVIRAX ) tablet 400 mg        400 mg Oral Daily 08/06/23 2133           Subjective: Seen and examined.  He has no complaints.  Objective: Vitals:   08/15/23 0515 08/15/23 1143 08/15/23 2007 08/16/23 0513  BP: (!) 153/72 134/69 139/69 (!) 149/67  Pulse: 78 92 86 82  Resp: 18 18 18 17   Temp: 97.8 F (36.6 C) 97.9 F (36.6 C) 98.4 F (36.9 C) 98 F (36.7 C)  TempSrc: Oral Oral Oral Oral  SpO2: 100% 99% 100% 100%  Weight:      Height:        Intake/Output Summary (Last 24 hours) at 08/16/2023 0820 Last data filed at 08/15/2023 1300 Gross per 24 hour  Intake 600 ml  Output --  Net 600 ml    Filed Weights   08/06/23 1506  Weight: 65.8 kg    Examination:  General exam: Appears calm and comfortable  Respiratory system: Clear to auscultation. Respiratory effort normal. Cardiovascular system: S1 & S2 heard, RRR. No JVD, murmurs, rubs, gallops or clicks. No pedal edema. Gastrointestinal system: Abdomen is nondistended, soft and nontender. No organomegaly or masses felt. Normal bowel sounds heard. Central nervous system: Alert and oriented. No focal neurological deficits. Extremities: Symmetric 5 x 5 power. Skin: No rashes, lesions or ulcers.  Psychiatry: Judgement and insight appear normal. Mood & affect appropriate.   Data Reviewed: I have personally reviewed following labs and imaging studies  CBC: Recent Labs  Lab  08/12/23 1100 08/13/23 0428 08/14/23 0414 08/15/23 0412 08/16/23 0420  WBC 7.4 5.3 7.1 8.7 10.3  NEUTROABS 2.5 2.0 2.4 2.6 2.7  HGB 9.7* 7.9* 7.4* 7.8* 8.1*  HCT 29.0* 24.0* 22.4* 23.3* 24.7*  MCV 96.3 95.6 94.9 95.9 96.1  PLT 51* 37* 41* 49* 54*   Basic Metabolic Panel: Recent Labs  Lab 08/10/23 0450 08/11/23 0444 08/11/23 1419 08/13/23 0428 08/15/23 0412  NA 128* 134* 132* 129* 133*  K 4.3 3.8 4.1 4.2 4.0  CL 98 107 98 98 100  CO2 21* 20* 23 22 23   GLUCOSE 157* 120* 199* 291* 270*  BUN 23 19 20  34* 30*  CREATININE 0.94 0.80 0.97 0.81 0.75  CALCIUM 9.3 8.7* 9.8 8.6* 8.5*   GFR: Estimated Creatinine Clearance: 83.4 mL/min (by C-G formula based on SCr of 0.75 mg/dL). Liver Function Tests: Recent Labs  Lab 08/10/23 0450 08/11/23 0444 08/11/23 1419  AST 15 13* 17  ALT 8 8 8   ALKPHOS 61 53 71  BILITOT 1.2 0.8 1.4*  PROT 6.0* 5.3* 7.3  ALBUMIN 3.2* 2.8* 3.7  No results for input(s): LIPASE, AMYLASE in the last 168 hours. No results for input(s): AMMONIA in the last 168 hours. Coagulation Profile: No results for input(s): INR, PROTIME in the last 168 hours.  Cardiac Enzymes: No results for input(s): CKTOTAL, CKMB, CKMBINDEX, TROPONINI in the last 168 hours. BNP (last 3 results) No results for input(s): PROBNP in the last 8760 hours. HbA1C: No results for input(s): HGBA1C in the last 72 hours. CBG: Recent Labs  Lab 08/15/23 0738 08/15/23 1141 08/15/23 1638 08/15/23 2043 08/16/23 0728  GLUCAP 170* 321* 328* 292* 103*   Lipid Profile: No results for input(s): CHOL, HDL, LDLCALC, TRIG, CHOLHDL, LDLDIRECT in the last 72 hours. Thyroid  Function Tests: No results for input(s): TSH, T4TOTAL, FREET4, T3FREE, THYROIDAB in the last 72 hours. Anemia Panel: No results for input(s): VITAMINB12, FOLATE, FERRITIN, TIBC, IRON, RETICCTPCT in the last 72 hours. Sepsis Labs: No results for input(s): PROCALCITON,  LATICACIDVEN in the last 168 hours.  Recent Results (from the past 240 hours)  Culture, blood (Routine X 2) w Reflex to ID Panel     Status: None   Collection Time: 08/07/23 12:44 AM   Specimen: BLOOD RIGHT ARM  Result Value Ref Range Status   Specimen Description   Final    BLOOD RIGHT ARM Performed at [facility], [address], [place], [place] [address]    Special Requests   Final    BOTTLES DRAWN AEROBIC AND ANAEROBIC Blood Culture adequate volume Performed at [facility], [address] [place], [place] [address]    Culture   Final    NO GROWTH 5 DAYS Performed at [facility], [address], [place], [place] [address]    Report Status 08/12/2023 FINAL  Final  Culture, blood (Routine X 2) w Reflex to ID Panel     Status: None   Collection Time: 08/07/23 12:44 AM   Specimen: BLOOD LEFT ARM  Result Value Ref Range Status   Specimen Description   Final    BLOOD LEFT ARM Performed at [facility], [address], [place], [place] [address]    Special Requests   Final    BOTTLES DRAWN AEROBIC AND ANAEROBIC Blood Culture adequate volume Performed at [facility], [address], [place], [place] [address]    Culture   Final    NO GROWTH 5 DAYS Performed at [facility], [address], [place], [place] [address]    Report Status 08/12/2023 FINAL  Final     Radiology Studies: No results found.   Scheduled Meds:  acyclovir   400 mg Oral Daily   allopurinol   150 mg Oral Daily   amLODipine   10 mg Oral Daily   gabapentin   100 mg Oral QHS   insulin  aspart  0-9 Units Subcutaneous TID WC   insulin  glargine-yfgn  30 Units Subcutaneous Daily   lidocaine   1 patch Transdermal Q24H   polyethylene glycol  17 g Oral Daily   [START ON 08/24/2023] predniSONE   10 mg Oral Q breakfast   [START ON 08/22/2023] predniSONE   20 mg Oral Q breakfast   [START ON 08/20/2023] predniSONE   30 mg Oral Q breakfast   [START ON  08/18/2023] predniSONE   40 mg Oral Q breakfast   predniSONE   50 mg Oral Q breakfast   senna-docusate  1 tablet Oral BID   simvastatin   20 mg Oral QHS   Continuous Infusions:     LOS: 10 days  Fredia Skeeter, MD [facility]  08/16/2023, 8:20 AM   *Please note that this is a verbal dictation therefore any spelling or grammatical errors are due to the Dragon Medical One system interpretation.  Please page via Amion and do not message via secure chat for urgent patient care matters. Secure chat can be used for non urgent patient care matters.  How to contact the TRH Attending or Consulting provider 7A - 7P or covering provider during after hours 7P -7A, for this patient?  Check the care team in [facility] and look for a) attending/consulting TRH provider listed and b) the TRH team listed. Page or secure chat 7A-7P. Log into [email or web address] and use [place]'s universal password to access. If you do not have the password, please contact the hospital operator. Locate the TRH provider you are looking for under [facility] and page to a number that you can be directly reached. If you still have difficulty reaching the provider, please page the [facility] (Director on Call) for the Hospitalists listed on amion for assistance.

## 2023-08-16 NOTE — TOC Progression Note (Addendum)
 Transition of Care [facility]) - Progression Note    Patient Details  Name: Tyler Scott MRN: [id number] Date of Birth: [birth year]  Transition of Care [facility]) CM/SW Contact  Doneta Glenys DASEN, RN Phone Number: 08/16/2023, 11:12 AM  Clinical Narrative:    Choice Offered Patient has chosen [facility]. [employer] will be started. [id number]       Service Provider Request Status STARS Address Phone   [facility]  Accepted 3 [address] [place] [address] [phone]  [facility]erred [facility]  Accepted 2 [address] [place] [address] [phone]  [facility]  Accepted 1 [address] [place] [address] [phone]  [facility]  Accepted 1 [address] [place] [address] [phone]  [facility]  Accepted 3 [address] [place] [address] [phone]  [facility] Preferred [facility]  Accepted 1 [address], [place] [address] [phone]   12:12 PM Mayme Barrows ID [id number] started for [facility] 3:13 PM Insurance pending  Expected Discharge Plan: Home w Home Health Services Barriers to Discharge: Continued Medical Work up  Expected Discharge Plan and Services In-house Referral: NA Discharge Planning Services: CM Consult   Living arrangements for the past 2 months: Single Family Home                   DME Agency: NA       HH Arranged: NA HH Agency: NA         Social Determinants of Health (SDOH) Interventions SDOH Screenings   Food Insecurity: No Food Insecurity (08/06/2023)  Housing: Low Risk  (08/10/2023)  Transportation Needs: No Transportation Needs (08/06/2023)  Utilities: Not At Risk (08/06/2023)  Social Connections: Socially Isolated (08/06/2023)  Tobacco Use: Low Risk  (08/06/2023)    Readmission Risk Interventions    08/07/2023    8:49 AM  Readmission Risk Prevention Plan  Transportation Screening Complete  PCP or  Specialist Appt within 5-7 Days Complete  Home Care Screening Complete  Medication Review (RN CM) Complete

## 2023-08-16 NOTE — Progress Notes (Signed)
 Tyler Scott   DOB:January 30, 1956   FM#:[id number]      ASSESSMENT & PLAN:  Tyler Scott is a 68 year old male patient with oncologic history significant for CLL. He was admitted on 08/06/23 with complaints of progressive low back pain, unable to walk x3 days.     CLL/SLL Low grade Lymphoma -new dx Lymphadenopathy -- Diagnosed CLL in 2012 -- status post several previous therapies including Bendamustine  and rituximab , Ibrutinib , and most recently on Calquence  since 2023.  -- Patient was supposed to be on Gazyva , venetoclax . He had many interruped cycles of Gazyva  due to No shows.  He was last seen in outpatient oncology on 07/06/23 at which time he was scheduled for C4D1 Gazyva  however was HELD due to infection/acute left sided epidydimo-orchitis for which he was on antibiotics.  -- Imaging done 6/27 shows progressive lymphadenopathy, may be CLL progression vs large cell transformation. -- Biopsy of RP LN 6/30, prelim path shows Low grade Lymphoma consistent with CLL/SLL.   -- Prednisone  60 mg po x5 days initiated 08/11/2023 with taper dosing over the next 2 weeks.  Continue upon discharge as ordered. -- Chemotherapy regimen with Bendamustine /rituximab  orders placed to start as outpatient along with weekly Nplate.   - Likely for discharge to SNF - Will follow closely with hematology outpatient/Dr. Onesimo postdischarge.    Pancytopenia: -- Secondary to malignancy Leukopenia --WBC stable 10.3 Anemia --Hemoglobin stable 8.1 today.  He is status post PRBC transfusion for Hgb 6.5 on 7/2.  Thrombocytopenia --Platelets continue to rise slowly 54K today - Recommend transfusion for counts <20K or <50K with bleeding. --Continue to monitor CBC with differential -- Status post IVIG 1g/kg x 2 doses to address possible ITP from CLL.  Completed 08/12/2023. - Patient for Nplate weekly as outpatient.   AKI -- Creatinine has normalized, BUN slightly elevated - Avoid nephrotoxic agents -- Continue  to monitor CMP       Code Status Full  Subjective:  Patient is seen awake and alert sitting up in bed.  Reports that he has chosen a rehab facility where he will be discharged.  Reviewed chemo regimen that patient will be getting upon discharge, Bendamustine  and rituximab .  Also reviewed that he will continue to be on tapered dose prednisone  upon discharge.  No other acute complaints offered.    Objective:  No intake or output data in the 24 hours ending 08/16/23 1334    PHYSICAL EXAMINATION: ECOG PERFORMANCE STATUS: 2 - Symptomatic, <50% confined to bed  Vitals:   08/16/23 0513 08/16/23 1323  BP: (!) 149/67 119/64  Pulse: 82 90  Resp: 17 16  Temp: 98 F (36.7 C) 97.7 F (36.5 C)  SpO2: 100% 100%   Filed Weights   08/06/23 1506  Weight: 145 lb (65.8 kg)    GENERAL: alert, + mild distress and comfortable SKIN: + Pale skin color, texture, turgor are normal, no rashes or significant lesions EYES: normal, conjunctiva are pink and non-injected, sclera clear OROPHARYNX: no exudate, no erythema and lips, buccal mucosa, and tongue normal  NECK: supple, thyroid  normal size, non-tender, without nodularity LYMPH: no palpable lymphadenopathy in the cervical, axillary or inguinal LUNGS: clear to auscultation and percussion with normal breathing effort HEART: regular rate & rhythm and no murmurs and no lower extremity edema ABDOMEN: abdomen soft, non-tender and normal bowel sounds MUSCULOSKELETAL: no cyanosis of digits and no clubbing  PSYCH: alert & oriented x 3 with fluent speech NEURO: no focal motor/sensory deficits   All questions  were answered. The patient knows to call the clinic with any problems, questions or concerns.   The total time spent in the appointment was 40 minutes encounter with patient including review of chart and various tests results, discussions about plan of care and coordination of care plan  Tyler JINNY Brunner, NP 08/16/2023 1:34 PM    Labs Reviewed:   Lab Results  Component Value Date   WBC 10.3 08/16/2023   HGB 8.1 (L) 08/16/2023   HCT 24.7 (L) 08/16/2023   MCV 96.1 08/16/2023   PLT 54 (L) 08/16/2023   Recent Labs    08/10/23 0450 08/11/23 0444 08/11/23 1419 08/13/23 0428 08/15/23 0412  NA 128* 134* 132* 129* 133*  K 4.3 3.8 4.1 4.2 4.0  CL 98 107 98 98 100  CO2 21* 20* 23 22 23   GLUCOSE 157* 120* 199* 291* 270*  BUN 23 19 20  34* 30*  CREATININE 0.94 0.80 0.97 0.81 0.75  CALCIUM 9.3 8.7* 9.8 8.6* 8.5*  GFRNONAA >60 >60 >60 >60 >60  PROT 6.0* 5.3* 7.3  --   --   ALBUMIN 3.2* 2.8* 3.7  --   --   AST 15 13* 17  --   --   ALT 8 8 8   --   --   ALKPHOS 61 53 71  --   --   BILITOT 1.2 0.8 1.4*  --   --     Studies Reviewed:  CT BIOPSY Result Date: 08/09/2023 CLINICAL DATA:  Progressive retroperitoneal adenopathy, history of CLL EXAM: CT GUIDED CORE BIOPSY OF LEFT PARA-AORTIC ADENOPATHY ANESTHESIA/SEDATION: Intravenous Fentanyl  200mcg and Versed  4mg  were administered by RN during a total moderate (conscious) sedation time of 25 minutes; the patient's level of consciousness and physiological / cardiorespiratory status were monitored continuously by radiology RN under my direct supervision. PROCEDURE: The procedure risks, benefits, and alternatives were explained to the patient. Questions regarding the procedure were encouraged and answered. The patient understands and consents to the procedure. patient placed prone. select axial scans through the abdomen obtained, adenopathy localized, and an appropriate skin entry site determined and marked. The operative field was prepped with chlorhexidinein a sterile fashion, and a sterile drape was applied covering the operative field. A sterile gown and sterile gloves were used for the procedure. Local anesthesia was provided with 1% Lidocaine . Under CT fluoroscopic guidance, a 17 gauge trocar needle was advanced to the margin of the lesion. Once needle tip position was confirmed, coaxial  18-gauge core biopsy samples were obtained, submitted in saline to surgical pathology. The guide needle was removed. Postprocedure scans show no hemorrhage or other apparent complication. The patient tolerated the procedure well. RADIATION DOSE REDUCTION: This exam was performed according to the departmental dose-optimization program which includes automated exposure control, adjustment of the mA and/or kV according to patient size and/or use of iterative reconstruction technique. COMPLICATIONS: None immediate FINDINGS: Bulky left para-aortic and aortocaval adenopathy localized. Representative core biopsy samples obtained as above. IMPRESSION: 1. Technically successful CT-guided core biopsy, left para-aortic retroperitoneal adenopathy. Electronically Signed   By: JONETTA Faes M.D.   On: 08/09/2023 15:52   ECHOCARDIOGRAM COMPLETE Result Date: 08/07/2023    ECHOCARDIOGRAM REPORT   Patient Name:   DEMETRY BENDICKSON Date of Exam: 08/07/2023 Medical Rec #:  [id number]           Height:       68.0 in Accession #:    [id number]          Weight:  145.0 lb Date of Birth:  Mar 29, 1955          BSA:          1.783 m Patient Age:    44 years            BP:           136/70 mmHg Patient Gender: M                   HR:           100 bpm. Exam Location:  Inpatient Procedure: 2D Echo, Cardiac Doppler and Color Doppler (Both Spectral and Color            Flow Doppler were utilized during procedure). Indications:    Chemo  History:        Patient has no prior history of Echocardiogram examinations.                 Risk Factors:Diabetes.  Sonographer:    Benard Stallion Referring Phys: [id number] PETER R ENNEVER IMPRESSIONS  1. Left ventricular ejection fraction, by estimation, is 60 to 65%. The left ventricle has normal function. The left ventricle has no regional wall motion abnormalities. There is mild left ventricular hypertrophy. Left ventricular diastolic parameters are consistent with Grade I diastolic dysfunction (impaired  relaxation).  2. Right ventricular systolic function is normal. The right ventricular size is normal.  3. The mitral valve is normal in structure. No evidence of mitral valve regurgitation. No evidence of mitral stenosis.  4. The tricuspid valve is abnormal. Tricuspid valve regurgitation is mild to moderate.  5. The aortic valve is tricuspid. Aortic valve regurgitation is not visualized. No aortic stenosis is present. FINDINGS  Left Ventricle: Left ventricular ejection fraction, by estimation, is 60 to 65%. The left ventricle has normal function. The left ventricle has no regional wall motion abnormalities. The left ventricular internal cavity size was normal in size. There is  mild left ventricular hypertrophy. Left ventricular diastolic parameters are consistent with Grade I diastolic dysfunction (impaired relaxation). Normal left ventricular filling pressure. Right Ventricle: IVC not visualized, not able to estimate PASP. The right ventricular size is normal. Right vetricular wall thickness was not well visualized. Right ventricular systolic function is normal. Left Atrium: Left atrial size was normal in size. Right Atrium: Right atrial size was normal in size. Pericardium: There is no evidence of pericardial effusion. Mitral Valve: The mitral valve is normal in structure. There is mild thickening of the mitral valve leaflet(s). There is mild calcification of the mitral valve leaflet(s). Mild mitral annular calcification. No evidence of mitral valve regurgitation. No evidence of mitral valve stenosis. Tricuspid Valve: The tricuspid valve is abnormal. Tricuspid valve regurgitation is mild to moderate. No evidence of tricuspid stenosis. Aortic Valve: The aortic valve is tricuspid. Aortic valve regurgitation is not visualized. No aortic stenosis is present. Aortic valve mean gradient measures 4.0 mmHg. Aortic valve peak gradient measures 7.8 mmHg. Aortic valve area, by VTI measures 3.18 cm. Pulmonic Valve: The  pulmonic valve was not well visualized. Pulmonic valve regurgitation is not visualized. No evidence of pulmonic stenosis. Aorta: The aortic root and ascending aorta are structurally normal, with no evidence of dilitation. IAS/Shunts: The interatrial septum was not well visualized.  LEFT VENTRICLE PLAX 2D LVIDd:         4.20 cm   Diastology LVIDs:         2.60 cm   LV e' medial:    7.29 cm/s LV  PW:         1.10 cm   LV E/e' medial:  12.9 LV IVS:        1.10 cm   LV e' lateral:   7.51 cm/s LVOT diam:     2.20 cm   LV E/e' lateral: 12.5 LV SV:         74 LV SV Index:   41 LVOT Area:     3.80 cm  RIGHT VENTRICLE RV Basal diam:  3.55 cm RV Mid diam:    3.30 cm RV S prime:     14.40 cm/s TAPSE (M-mode): 3.0 cm LEFT ATRIUM             Index        RIGHT ATRIUM           Index LA diam:        3.20 cm 1.80 cm/m   RA Area:     13.40 cm LA Vol (A2C):   52.1 ml 29.23 ml/m  RA Volume:   34.40 ml  19.30 ml/m LA Vol (A4C):   28.9 ml 16.21 ml/m LA Biplane Vol: 38.6 ml 21.65 ml/m  AORTIC VALVE AV Area (Vmax):    3.18 cm AV Area (Vmean):   3.22 cm AV Area (VTI):     3.18 cm AV Vmax:           140.00 cm/s AV Vmean:          93.700 cm/s AV VTI:            0.232 m AV Peak Grad:      7.8 mmHg AV Mean Grad:      4.0 mmHg LVOT Vmax:         117.00 cm/s LVOT Vmean:        79.300 cm/s LVOT VTI:          0.194 m LVOT/AV VTI ratio: 0.84  AORTA Ao Root diam: 3.40 cm Ao Asc diam:  3.00 cm MITRAL VALVE                TRICUSPID VALVE MV Area (PHT): 3.99 cm     TR Peak grad:   48.2 mmHg MV Decel Time: 190 msec     TR Vmax:        347.00 cm/s MV E velocity: 93.80 cm/s MV A velocity: 119.00 cm/s  SHUNTS MV E/A ratio:  0.79         Systemic VTI:  0.19 m                             Systemic Diam: 2.20 cm Dorn Ross MD Electronically signed by Dorn Ross MD Signature Date/Time: 08/07/2023/12:33:53 PM    Final    CT CHEST WO CONTRAST Result Date: 08/07/2023 CLINICAL DATA:  History of CLL.  Hematologic malignancy staging. EXAM: CT  CHEST WITHOUT CONTRAST TECHNIQUE: Multidetector CT imaging of the chest was performed following the standard protocol without IV contrast. RADIATION DOSE REDUCTION: This exam was performed according to the departmental dose-optimization program which includes automated exposure control, adjustment of the mA and/or kV according to patient size and/or use of iterative reconstruction technique. COMPARISON:  CT abdomen and pelvis 08/06/2023.  PET-CT 03/05/2023 FINDINGS: Cardiovascular: Heart size is normal without significant pericardial fluid. Coronary artery calcifications. Atherosclerotic calcifications in thoracic aorta without aneurysm. Mediastinum/Nodes: Compared to the PET-CT from 03/05/2023, there has been enlargement lymph nodes throughout the chest. Nodal tissue  in the left supraclavicular area adjacent to the left thyroid  lobe measures 3.2 cm in the short axis on image 17/2 and previously measured 2.7 cm in the short axis. Prominent nodal tissue along the right side of the mediastinum on image 38/2 measures 3.2 cm in the short axis and previously measured 2.9 cm. Index left axillary lymph node on image 36/2 measures 2.6 cm in the short axis previously measured 2.0 cm. This new or increased soft tissue along the posterior mediastinum and paraspinal region on image 88/2 that measures 2.2 cm in the short axis. There is also soft tissue along the right side of the posterior mediastinum and paraspinal region. Enlarged lymph nodes in the cardiophrenic fat. Enlarged right internal mammary lymph nodes. Lungs/Pleura: Trachea and mainstem bronchi are patent. Trace left pleural fluid with new pleural-based thickening particularly along the paraspinal regions. Volume loss in the posterior lower lobes particularly on the left side. Subtle ground-glass opacities along the anterior right lung adjacent to the right internal mammary chain are nonspecific but favor atelectasis. No suspicious pulmonary nodules. Upper Abdomen:  Extensive lymphadenopathy in the upper abdomen. Spleen appears to be enlarged. Musculoskeletal: No acute bone abnormality. No suspicious osseous lesions. IMPRESSION: 1. Progression of the lymph node enlargement throughout the chest compared to the PET-CT from 03/05/2023. Diffuse lymphadenopathy throughout the chest as described. 2. Markedly increased nodal tissue and soft tissue thickening in the posterior mediastinum and paraspinal regions. 3. Volume loss in the posterior lower lobes particularly on the left side. Trace left pleural fluid. 4.  Aortic Atherosclerosis (ICD10-I70.0). Electronically Signed   By: Juliene Balder M.D.   On: 08/07/2023 09:28   MR LUMBAR SPINE WO CONTRAST Result Date: 08/06/2023 CLINICAL DATA:  Myelopathy, acute, lumbar spine. EXAM: MRI LUMBAR SPINE WITHOUT CONTRAST TECHNIQUE: Multiplanar, multisequence MR imaging of the lumbar spine was performed. No intravenous contrast was administered. COMPARISON:  None Available. FINDINGS: Segmentation:  Standard. Alignment:  No substantial sagittal subluxation. Vertebrae: Diffusely low T1 signal throughout the bone marrow with heterogeneous signal in the sacral vertebral bodies. No evidence of acute fracture or discitis/osteomyelitis. Conus medullaris and cauda equina: Conus extends to the L1-L2 level. Conus and cauda equina appear normal. Paraspinal and other soft tissues: There appears to be intermediate extraosseous signal surrounding the vertebral bodies at multiple levels (for example see series 9, image 28 and series 9, image 20), possibly extraosseous extension of tumor but not well assessed without contrast. Please see same day CT of the abdomen/pelvis for description of retroperitoneal nodal mass and other intra-abdominal intrapelvic findings. Disc levels: T12-L1: No significant disc protrusion, foraminal stenosis, or canal stenosis. L1-L2: No significant disc protrusion, foraminal stenosis, or canal stenosis. L2-L3: No significant disc  protrusion, foraminal stenosis, or canal stenosis. L3-L4: No significant disc protrusion, foraminal stenosis, or canal stenosis. L4-L5: Mild disc bulge and endplate spurring without significant stenosis. L5-S1: Question subtle intermediate signal along the posterior aspect of the L5 vertebral body which partially effaces ventral CSF. Mild disc bulging at the disc level without significant canal or foraminal stenosis. Sacral canal: Question mild intermediate signal in the ventral canal without compressive stenosis. IMPRESSION: 1. Diffusely abnormal marrow signal, likely related to the patient's underlying lymphoma. 2. Masslike extraosseous paraspinal signal abnormality at multiple levels, and L4 and subtle, equivacal signal in the ventral canal in the lower lumbar canal and sacral canal could potentially represent extraosseous extension of tumor but is incompletely assessed without contrast. If the patient is able, consider postcontrast imaging. 3. No compressive canal  or foraminal stenosis. Electronically Signed   By: Gilmore GORMAN Molt M.D.   On: 08/06/2023 19:37   CT ABDOMEN PELVIS WO CONTRAST Result Date: 08/06/2023 CLINICAL DATA:  Chronic lymphocytic leukemia. Acute abdominal pain. * Tracking Code: BO * EXAM: CT ABDOMEN AND PELVIS WITHOUT CONTRAST TECHNIQUE: Multidetector CT imaging of the abdomen and pelvis was performed following the standard protocol without IV contrast. RADIATION DOSE REDUCTION: This exam was performed according to the departmental dose-optimization program which includes automated exposure control, adjustment of the mA and/or kV according to patient size and/or use of iterative reconstruction technique. COMPARISON:  PET-CT 03/05/2023 FINDINGS: Lower chest: Thickened paraspinal tissue in the posterior mediastinum (image 8/2). Findings consistent lymphoma. Hepatobiliary: No focal hepatic lesion. Normal gallbladder. No biliary duct dilatation. Common bile duct is normal. Pancreas: Pancreas  is normal. No ductal dilatation. No pancreatic inflammation. Spleen: Spleen is mildly enlarged at 14. Cm in craniocaudad dimension Adrenals/urinary tract: Adrenal glands and kidneys are normal. The ureters and bladder normal. Stomach/Bowel: Stomach, small bowel, appendix, and cecum are normal. The colon and rectosigmoid colon are normal. Vascular/Lymphatic: Bulky retroperitoneal lymphadenopathy increased from comparison exam. Retroperitoneal nodal mass at the level of the lower pole LEFT kidney LEFT aorta measures 7.4 cm (image 46/2) compared to 5.1 cm comparison PET-CT scan. The aorta is elevated by 19 mm by retroperitoneal adenopathy compared to 8 mm on prior. Example node ventral to the aorta measuring 33 mm on image 52 compares to 23 mm. Bulky adenopathy extends along the iliac chains. LEFT external iliac lymph node anterior to the operator space measuring 22 mm compared to 13 mm. Minimal inguinal adenopathy. Reproductive: Prostate unremarkable Other: No free fluid. Musculoskeletal: No aggressive osseous lesion. Posterior fusion of the L4 spinous processes IMPRESSION: 1. Progression of lymphoma lymphadenopathy with increase in bulky retroperitoneal nodal mass. 2. Increase in perispinal thickening in the posterior mediastinum. 3. Mild splenomegaly. 4. No bowel obstruction. Electronically Signed   By: Jackquline Boxer M.D.   On: 08/06/2023 18:20

## 2023-08-16 NOTE — Progress Notes (Signed)
 Occupational Therapy Treatment Patient Details Name: Tyler Scott MRN: [id number] DOB: 01/29/1956 Today's Date: 08/16/2023   History of present illness 68 year old male who presented with a complaint of progressive low back pain, not able to walk, decreased oral intake.  Lab work on presentation showed pancytopenia.  CT abdomen/pelvis showed progression of lymphoma lymphadenopathy with increase in bulky retroperitoneal nodal mass.  MRI of the lumbar spine showed Masslike extraosseous paraspinal signal abnormality at multiple  Levels.  Oncology consulted.  Plan for possible lymph node biopsy, Port-A-Cath placement for starting of chemotherapy.  PMHx:  insulin -dependent type type II, hypertension, CLL   OT comments  Pt is progressing well. Supervised to bathroom for toileting and grooming at sink. Pt donned clothing mod I. Remained up in chair at end of session with bone pillow to support low back. Patient planning to discharge to SNF for further rehab as he has a flight of stairs to get to his bedroom at home.       If plan is discharge home, recommend the following:  Help with stairs or ramp for entrance;Assist for transportation;Assistance with cooking/housework;A little help with walking and/or transfers   Equipment Recommendations       Recommendations for Other Services      Precautions / Restrictions Precautions Precautions: Fall       Mobility Bed Mobility Overal bed mobility: Modified Independent                  Transfers Overall transfer level: Needs assistance Equipment used: Rolling walker (2 wheels) Transfers: Sit to/from Stand Sit to Stand: Modified independent (Device/Increase time)                 Balance   Sitting-balance support: No upper extremity supported, Feet supported Sitting balance-Leahy Scale: Good       Standing balance-Leahy Scale: Fair                             ADL either performed or assessed with clinical  judgement   ADL       Grooming: Wash/dry hands;Supervision/safety           Upper Body Dressing : Modified independent   Lower Body Dressing: Modified independent   Toilet Transfer: Supervision/safety           Functional mobility during ADLs: Modified independent;Rolling walker (2 wheels)      Extremity/Trunk Assessment              Vision       Perception     Praxis     Communication Communication Communication: No apparent difficulties   Cognition Arousal: Alert Behavior During Therapy: WFL for tasks assessed/performed Cognition: No apparent impairments                               Following commands: Intact        Cueing   Cueing Techniques: Verbal cues  Exercises      Shoulder Instructions       General Comments      Pertinent Vitals/ Pain       Pain Assessment Pain Assessment: Faces Faces Pain Scale: Hurts a little bit Pain Location: back Pain Descriptors / Indicators: Guarding, Sore Pain Intervention(s): Monitored during session, Repositioned  Home Living  Prior Functioning/Environment              Frequency  Min 2X/week        Progress Toward Goals  OT Goals(current goals can now be found in the care plan section)  Progress towards OT goals: Progressing toward goals  Acute Rehab OT Goals OT Goal Formulation: With patient Time For Goal Achievement: 08/22/23 Potential to Achieve Goals: Good  Plan      Co-evaluation                 AM-PAC OT 6 Clicks Daily Activity     Outcome Measure   Help from another person eating meals?: None Help from another person taking care of personal grooming?: None Help from another person toileting, which includes using toliet, bedpan, or urinal?: None Help from another person bathing (including washing, rinsing, drying)?: A Little Help from another person to put on and taking off regular upper  body clothing?: None Help from another person to put on and taking off regular lower body clothing?: None 6 Click Score: 23    End of Session Equipment Utilized During Treatment: Rolling walker (2 wheels)  OT Visit Diagnosis: Unsteadiness on feet (R26.81)   Activity Tolerance Patient tolerated treatment well   Patient Left in chair;with call bell/phone within reach   Nurse Communication          Time: 8779-8695 OT Time Calculation (min): 44 min  Charges: OT General Charges $OT Visit: 1 Visit OT Treatments $Self Care/Home Management : 38-52 mins  Tyler Scott, OTR/L Acute Rehabilitation Services Office: [phone]   Tyler Scott 08/16/2023, 1:17 PM

## 2023-08-16 NOTE — Plan of Care (Signed)

## 2023-08-17 ENCOUNTER — Other Ambulatory Visit: Payer: Self-pay

## 2023-08-17 ENCOUNTER — Encounter: Payer: Self-pay | Admitting: Hematology

## 2023-08-17 ENCOUNTER — Other Ambulatory Visit (HOSPITAL_COMMUNITY): Payer: Self-pay

## 2023-08-17 DIAGNOSIS — N179 Acute kidney failure, unspecified: Secondary | ICD-10-CM | POA: Diagnosis not present

## 2023-08-17 LAB — CBC WITH DIFFERENTIAL/PLATELET
Abs Immature Granulocytes: 0.17 K/uL — ABNORMAL HIGH (ref 0.00–0.07)
Basophils Absolute: 0.1 K/uL (ref 0.0–0.1)
Basophils Relative: 1 %
Eosinophils Absolute: 0 K/uL (ref 0.0–0.5)
Eosinophils Relative: 0 %
HCT: 24.7 % — ABNORMAL LOW (ref 39.0–52.0)
Hemoglobin: 8.2 g/dL — ABNORMAL LOW (ref 13.0–17.0)
Immature Granulocytes: 1 %
Lymphocytes Relative: 62 %
Lymphs Abs: 9.1 K/uL — ABNORMAL HIGH (ref 0.7–4.0)
MCH: 31.9 pg (ref 26.0–34.0)
MCHC: 33.2 g/dL (ref 30.0–36.0)
MCV: 96.1 fL (ref 80.0–100.0)
Monocytes Absolute: 2 K/uL — ABNORMAL HIGH (ref 0.1–1.0)
Monocytes Relative: 14 %
Neutro Abs: 3.2 K/uL (ref 1.7–7.7)
Neutrophils Relative %: 22 %
Platelets: 59 K/uL — ABNORMAL LOW (ref 150–400)
RBC: 2.57 MIL/uL — ABNORMAL LOW (ref 4.22–5.81)
RDW: 15.5 % (ref 11.5–15.5)
WBC: 14.6 K/uL — ABNORMAL HIGH (ref 4.0–10.5)
nRBC: 0.8 % — ABNORMAL HIGH (ref 0.0–0.2)

## 2023-08-17 LAB — GLUCOSE, CAPILLARY
Glucose-Capillary: 56 mg/dL — ABNORMAL LOW (ref 70–99)
Glucose-Capillary: 60 mg/dL — ABNORMAL LOW (ref 70–99)
Glucose-Capillary: 109 mg/dL — ABNORMAL HIGH (ref 70–99)
Glucose-Capillary: 127 mg/dL — ABNORMAL HIGH (ref 70–99)
Glucose-Capillary: 320 mg/dL — ABNORMAL HIGH (ref 70–99)
Glucose-Capillary: 374 mg/dL — ABNORMAL HIGH (ref 70–99)

## 2023-08-17 MED ORDER — PREDNISONE 10 MG PO TABS
ORAL_TABLET | ORAL | 0 refills | Status: AC
  Filled 2023-08-17 (×2): qty 20, 8d supply, fill #0

## 2023-08-17 MED ORDER — AMLODIPINE BESYLATE 10 MG PO TABS
10.0000 mg | ORAL_TABLET | Freq: Every day | ORAL | 0 refills | Status: DC
  Filled 2023-08-17: qty 30, 30d supply, fill #0

## 2023-08-17 MED ORDER — INSULIN ASPART 100 UNIT/ML IJ SOLN
5.0000 [IU] | Freq: Once | INTRAMUSCULAR | Status: AC
  Administered 2023-08-17: 5 [IU] via SUBCUTANEOUS

## 2023-08-17 MED ORDER — OXYCODONE HCL 5 MG PO TABS
5.0000 mg | ORAL_TABLET | ORAL | 0 refills | Status: DC | PRN
  Filled 2023-08-17: qty 30, 5d supply, fill #0

## 2023-08-17 MED FILL — Immune Globulin (Human) IV Soln 5 GM/50ML: INTRAVENOUS | Qty: 50 | Status: AC

## 2023-08-17 NOTE — Plan of Care (Signed)

## 2023-08-17 NOTE — Inpatient Diabetes Management (Signed)
 Inpatient Diabetes Program Recommendations  AACE/ADA: New Consensus Statement on Inpatient Glycemic Control (2015)  Target Ranges:  Prepandial:   less than 140 mg/dL      Peak postprandial:   less than 180 mg/dL (1-2 hours)      Critically ill patients:  140 - 180 mg/dL   Lab Results  Component Value Date   GLUCAP 109 (H) 08/17/2023   HGBA1C 5.8 (H) 08/07/2023    Review of Glycemic Control  Diabetes history: DM2 Outpatient Diabetes medications: Lantus  20-24 units at bedtime, Jardiance 12.5 mg QAM Current orders for Inpatient glycemic control: Semglee  30 units daily, Novolog  0-9 TID with meals   HgbA1C - 5.8% Hypo this am of 56, 60, 109  Inpatient Diabetes Program Recommendations:    Consider decreasing Semglee  to 20 units daily  Continue to follow.  Thank you. Shona Brandy, RD, LDN, CDCES Inpatient Diabetes Coordinator [phone]

## 2023-08-17 NOTE — TOC Progression Note (Addendum)
 Transition of Care [facility]) - Progression Note    Patient Details  Name: Tyler Scott MRN: [id number] Date of Birth: [birth year]  [facility] CM/SW Contact  Doneta Glenys DASEN, RN Phone Number: 08/17/2023, 9:57 AM  Clinical Narrative:    Still waiting on insurance auth for SNF. 1:10 PM CM received a call from [facility] community insurance requesting a Peer-to-Peer to be completed by 08/18/2023 at 9:00 AM.  1:45 PM Dr. Vernon sent a chat informin him of the request for a Peer-to-Peer to be completed by 08/18/2023 at 9:00 am. CM provided [facility] phone number [phone] option 5, and required patients information (name, DOB and member ID # [id number]).   Expected Discharge Plan: Home w Home Health Services Barriers to Discharge: Continued Medical Work up  Expected Discharge Plan and Services In-house Referral: NA Discharge Planning Services: CM Consult   Living arrangements for the past 2 months: Single Family Home                   DME Agency: NA       HH Arranged: NA HH Agency: NA         Social Determinants of Health (SDOH) Interventions SDOH Screenings   Food Insecurity: No Food Insecurity (08/06/2023)  Housing: Low Risk  (08/10/2023)  Transportation Needs: No Transportation Needs (08/06/2023)  Utilities: Not At Risk (08/06/2023)  Social Connections: Socially Isolated (08/06/2023)  Tobacco Use: Low Risk  (08/06/2023)    Readmission Risk Interventions    08/07/2023    8:49 AM  Readmission Risk Prevention Plan  Transportation Screening Complete  PCP or Specialist Appt within 5-7 Days Complete  Home Care Screening Complete  Medication Review (RN CM) Complete

## 2023-08-17 NOTE — Discharge Summary (Signed)
 Physician Discharge Summary  Tyler Scott FMW:[id number] DOB: Mar 03, 1955 DOA: 08/06/2023  PCP: [name], MD  Admit date: 08/06/2023 Discharge date: 08/17/2023 30 Day Unplanned Readmission Risk Score    Flowsheet Row ED to Hosp-Admission (Current) from 08/06/2023 in [facility]  30 Day Unplanned Readmission Risk Score (%) 22.89 Filed at 08/17/2023 1200    This score is the patient's risk of an unplanned readmission within 30 days of being discharged (0 -100%). The score is based on dignosis, age, lab data, medications, orders, and past utilization.   Low:  0-14.9   Medium: 15-21.9   High: 22-29.9   Extreme: 30 and above          Admitted From: Home Disposition: Home  Recommendations for Outpatient Follow-up:  Follow up with PCP in 1-2 weeks Please obtain BMP/CBC in one week Follow-up with oncology for the start of chemotherapy. Please follow up with your PCP on the following pending results: Unresulted Labs (From admission, onward)     Start     Ordered   08/12/23 1015  CBC with Differential/Platelet  Daily,   R      08/12/23 1014   08/11/23 0000  CMP (Cancer Center only)  STAT        08/11/23 2346   08/11/23 0000  CBC with Differential (Cancer Center Only)  STAT        08/11/23 2355   08/11/23 0000  CMP (Cancer Center only)  STAT        08/11/23 2355   Unscheduled  Hepatitis B surface antigen  STAT      08/11/23 2345   Unscheduled  Hepatitis B core antibody, total  STAT      08/11/23 2345              Home Health: Yes Equipment/Devices: None   Discharge Condition: Stable CODE STATUS: Full code Diet recommendation: Cardiac  Subjective: Seen and examined.  Feels well.  No complaints.  Ready to go home.  Brief/Interim Summary: Patient is a 68 year old male with history of insulin -dependent type type II, hypertension, CLL who presented with a complaint of progressive low back pain, not able to walk,  decreased oral intake.  Lab work on presentation showed pancytopenia.  CT abdomen/pelvis showed  Progression of lymphoma lymphadenopathy with increase in bulky retroperitoneal nodal mass.  MRI of the lumbar spine showed Masslike extraosseous paraspinal signal abnormality at multiple levels.  Oncology consulted and following.  S/P  lymph node biopsy   CLL/pancytopenia/SLL: Presented with low back pain.CT abdomen/pelvis showed  progression of lymphoma lymphadenopathy with increase in bulky retroperitoneal nodal mass.  MRI of the lumbar spine showed masslike extraosseous paraspinal signal abnormality at multiple levels.  History of CLL, was on different therapies.  Has adenopathy in the inguinal area.  Oncology suspecting transformation of CLL to aggressive lymphoma.  Suspicion of bone marrow involvement.  Given rasburicase   for high uric acid  level.  Oncology eventually planning for starting chemotherapy, Port-A-Cath placement. S/P retroperitoneal LN biopsy on 6/30.  path showed low-grade lymphoma.  Patient was given 2 doses of IVIG and high-dose of prednisone  with recommendations to taper in next few days.  Hemoglobin was 6.5 on 08/11/2023 for which she was given 1 unit of PRBC transfusion, hemoglobin improved to over 9 posttransfusion and then dropped to 7.4 but improved to 8.1 today without transfusion.  Patient's hemoglobin has remained stable.  Patient has been cleared for discharge from oncology and they will start  chemotherapy as outpatient. Chemotherapy regimen with Bendamustine /rituximab  orders placed to start as outpatient along with weekly Nplate    AKI: No obstructive pathology as per CT abdomen/pelvis.  UA unremarkable.  Hydrated.  Renal function normalized.   Hyponatremia: Received gentle hydration with normal saline yesterday, sodium improved to 132 yesterday but down to 133.   Hypertension: Taking lisinopril  at home.  Started on amlodipine  due to AKI.  Blood pressure controlled.   Discharging on amlodipine  instead of lisinopril .   Insulin -dependent diabetes type 2: A1c of 5.8.  Currently hyperglycemic on Lantus  due to prednisone .  He is going home with tapering dose so hopefully he will be fine without insulin .   Failure to thrive/generalized weakness/disposition: PT consulted, recommend CIR however, Per CIR, they spoke with patient's wife by phone and wife works a lot and does not have anyone who can stay with the patient after potential GIST and wife wanted him to go to SNF.  We were waiting for insurance authorization for several days.  Was informed by [facility] today that insurance denied his SNF as well.  It was noted afterwards that patient was last seen by PT on 08/14/2023 and they recommended home with home health however he was seen by OT and they continued to recommend SNF only because patient wanted to go to SNF.  Discussed with PT today, they recommended home with home health.  TOC discussed this with the patient.  He is willing to go home.  Discharging home with home health.   Constipation: Resolved.   Discharge Diagnoses:  Principal Problem:   AKI (acute kidney injury) (HCC) Active Problems:   CLL (chronic lymphocytic leukemia) (HCC)   Insulin  dependent type 2 diabetes mellitus (HCC)   Pancytopenia (HCC)   Lymphoma [facility])    Discharge Instructions  Discharge Instructions     Clinic Appointment Request   Complete by: Aug 16, 2023    Contact your oncology clinic or infusion center to schedule this appointment.   Infusion Appointment Request (345 min)   Complete by: Aug 16, 2023    Contact your oncology clinic or infusion center to schedule this appointment.   Lab Appointment Request   Complete by: Aug 16, 2023    Patient requires a Lab or Flush Appointment?: Lab Appointment   Contact your oncology clinic or infusion center to schedule this appointment.   Infusion Appointment Request   Complete by: Aug 17, 2023    Contact your oncology clinic or infusion  center to schedule this appointment.   ONCBCN PHYSICIAN COMMUNICATION 1   Complete by: As directed    For pathway [id number]: treatment x 6 Cycles (or 2 Cycles Beyond CR).   PHYSICIAN COMMUNICATION ORDER   Complete by: As directed    Hepatitis B Virus screening with HBsAg and anti-HBc recommended prior to treatment with rituximab , ofatumumab, or obinutuzumab .   Clinic Appointment Request   Complete by: Sep 13, 2023    Contact your oncology clinic or infusion center to schedule this appointment.   Infusion Appointment Request   Complete by: Sep 13, 2023    Contact your oncology clinic or infusion center to schedule this appointment.   Lab Appointment Request   Complete by: Sep 13, 2023    Patient requires a Lab or Flush Appointment?: Lab Appointment   Contact your oncology clinic or infusion center to schedule this appointment.   Infusion Appointment Request   Complete by: Sep 14, 2023    Contact your oncology clinic or infusion center to schedule  this appointment.      Allergies as of 08/17/2023   No Known Allergies      Medication List     STOP taking these medications    lisinopril  40 MG tablet Commonly known as: ZESTRIL    venetoclax  10 & 50 & 100 MG Starter Pack Commonly known as: VENCLEXTA        TAKE these medications    acyclovir  400 MG tablet Commonly known as: ZOVIRAX  Take 1 tablet (400 mg total) by mouth daily.   allopurinol  300 MG tablet Commonly known as: ZYLOPRIM  Take 0.5 tablets (150 mg total) by mouth daily.   amLODipine  10 MG tablet Commonly known as: NORVASC  Take 1 tablet (10 mg total) by mouth daily for 10 days. Start taking on: August 18, 2023   Cholecalciferol 50 MCG (2000 UT) Tabs TAKE ONE TABLET BY MOUTH DAILY FOR LOW VITAMIN D.   doxepin  25 MG capsule Commonly known as: SINEQUAN  Take 1 capsule by mouth at bedtime.   empagliflozin 25 MG Tabs tablet Commonly known as: JARDIANCE Take 0.5 tablets by mouth every morning. Only if blood sugars  get too high   finasteride  5 MG tablet Commonly known as: PROSCAR  Take 0.5 tablets by mouth daily.   insulin  glargine 100 UNIT/ML Solostar Pen Commonly known as: Lantus  SoloStar INJECT 20 UNITS SUBCUTANEOUSLY AT BEDTIME   ondansetron  8 MG tablet Commonly known as: Zofran  Take 1 tablet (8 mg total) by mouth every 8 (eight) hours as needed for nausea or vomiting. Start on the third day after chemotherapy.   ONE TOUCH ULTRA 2 w/Device Kit See admin instructions.   ONE TOUCH ULTRA TEST test strip Generic drug: glucose blood TEST GLUCOSE 3 TIMES A DAY   OneTouch Delica Lancets 33G Misc TEST GLUCOSE 3 TIMES A DAY   oxyCODONE  5 MG immediate release tablet Commonly known as: Oxy IR/ROXICODONE  Take 1 tablet (5 mg total) by mouth every 4 (four) hours as needed for moderate pain (pain score 4-6).   predniSONE  10 MG tablet Commonly known as: DELTASONE  Take 4 tablets (40 mg total) by mouth daily for 2 days, THEN 3 tablets (30 mg total) daily for 2 days, THEN 2 tablets (20 mg total) daily for 2 days, THEN 1 tablet (10 mg total) daily for 2 days. Start taking on: August 17, 2023   prochlorperazine  10 MG tablet Commonly known as: COMPAZINE  Take 1 tablet (10 mg total) by mouth every 6 (six) hours as needed for nausea or vomiting.   simvastatin  20 MG tablet Commonly known as: ZOCOR  TAKE 1 TABLET (20 MG TOTAL) BY MOUTH AT BEDTIME.        Contact information for follow-up providers     [name], MD Follow up in 1 week(s).   Specialty: Family Medicine Contact information: [address] [place] [address] [phone]         Onesimo Emaline Brink, MD Follow up in 1 week(s).   Specialties: Hematology, Oncology Contact information: [address] [place] [address] [phone]              Contact information for after-discharge care     Destination     [facility]   Service: Skilled  Nursing Contact information: [address] [place]  [address] [phone]                    No Known Allergies  Consultations: Oncology   Procedures/Studies: CT BIOPSY Result Date: 08/09/2023 CLINICAL DATA:  Progressive  retroperitoneal adenopathy, history of CLL EXAM: CT GUIDED CORE BIOPSY OF LEFT PARA-AORTIC ADENOPATHY ANESTHESIA/SEDATION: Intravenous Fentanyl  200mcg and Versed  4mg  were administered by RN during a total moderate (conscious) sedation time of 25 minutes; the patient's level of consciousness and physiological / cardiorespiratory status were monitored continuously by radiology RN under my direct supervision. PROCEDURE: The procedure risks, benefits, and alternatives were explained to the patient. Questions regarding the procedure were encouraged and answered. The patient understands and consents to the procedure. patient placed prone. select axial scans through the abdomen obtained, adenopathy localized, and an appropriate skin entry site determined and marked. The operative field was prepped with chlorhexidinein a sterile fashion, and a sterile drape was applied covering the operative field. A sterile gown and sterile gloves were used for the procedure. Local anesthesia was provided with 1% Lidocaine . Under CT fluoroscopic guidance, a 17 gauge trocar needle was advanced to the margin of the lesion. Once needle tip position was confirmed, coaxial 18-gauge core biopsy samples were obtained, submitted in saline to surgical pathology. The guide needle was removed. Postprocedure scans show no hemorrhage or other apparent complication. The patient tolerated the procedure well. RADIATION DOSE REDUCTION: This exam was performed according to the departmental dose-optimization program which includes automated exposure control, adjustment of the mA and/or kV according to patient size and/or use of iterative reconstruction technique. COMPLICATIONS: None immediate  FINDINGS: Bulky left para-aortic and aortocaval adenopathy localized. Representative core biopsy samples obtained as above. IMPRESSION: 1. Technically successful CT-guided core biopsy, left para-aortic retroperitoneal adenopathy. Electronically Signed   By: JONETTA Faes M.D.   On: 08/09/2023 15:52   ECHOCARDIOGRAM COMPLETE Result Date: 08/07/2023    ECHOCARDIOGRAM REPORT   Patient Name:   ALVAN CULPEPPER Date of Exam: 08/07/2023 Medical Rec #:  [id number]           Height:       68.0 in Accession #:    [id number]          Weight:       145.0 lb Date of Birth:  January 13, 1956          BSA:          1.783 m Patient Age:    67 years            BP:           136/70 mmHg Patient Gender: M                   HR:           100 bpm. Exam Location:  Inpatient Procedure: 2D Echo, Cardiac Doppler and Color Doppler (Both Spectral and Color            Flow Doppler were utilized during procedure). Indications:    Chemo  History:        Patient has no prior history of Echocardiogram examinations.                 Risk Factors:Diabetes.  Sonographer:    Benard Stallion Referring Phys: [id number] PETER R ENNEVER IMPRESSIONS  1. Left ventricular ejection fraction, by estimation, is 60 to 65%. The left ventricle has normal function. The left ventricle has no regional wall motion abnormalities. There is mild left ventricular hypertrophy. Left ventricular diastolic parameters are consistent with Grade I diastolic dysfunction (impaired relaxation).  2. Right ventricular systolic function is normal. The right ventricular size is normal.  3. The mitral valve is normal in structure. No evidence of mitral  valve regurgitation. No evidence of mitral stenosis.  4. The tricuspid valve is abnormal. Tricuspid valve regurgitation is mild to moderate.  5. The aortic valve is tricuspid. Aortic valve regurgitation is not visualized. No aortic stenosis is present. FINDINGS  Left Ventricle: Left ventricular ejection fraction, by estimation, is 60 to 65%. The  left ventricle has normal function. The left ventricle has no regional wall motion abnormalities. The left ventricular internal cavity size was normal in size. There is  mild left ventricular hypertrophy. Left ventricular diastolic parameters are consistent with Grade I diastolic dysfunction (impaired relaxation). Normal left ventricular filling pressure. Right Ventricle: IVC not visualized, not able to estimate PASP. The right ventricular size is normal. Right vetricular wall thickness was not well visualized. Right ventricular systolic function is normal. Left Atrium: Left atrial size was normal in size. Right Atrium: Right atrial size was normal in size. Pericardium: There is no evidence of pericardial effusion. Mitral Valve: The mitral valve is normal in structure. There is mild thickening of the mitral valve leaflet(s). There is mild calcification of the mitral valve leaflet(s). Mild mitral annular calcification. No evidence of mitral valve regurgitation. No evidence of mitral valve stenosis. Tricuspid Valve: The tricuspid valve is abnormal. Tricuspid valve regurgitation is mild to moderate. No evidence of tricuspid stenosis. Aortic Valve: The aortic valve is tricuspid. Aortic valve regurgitation is not visualized. No aortic stenosis is present. Aortic valve mean gradient measures 4.0 mmHg. Aortic valve peak gradient measures 7.8 mmHg. Aortic valve area, by VTI measures 3.18 cm. Pulmonic Valve: The pulmonic valve was not well visualized. Pulmonic valve regurgitation is not visualized. No evidence of pulmonic stenosis. Aorta: The aortic root and ascending aorta are structurally normal, with no evidence of dilitation. IAS/Shunts: The interatrial septum was not well visualized.  LEFT VENTRICLE PLAX 2D LVIDd:         4.20 cm   Diastology LVIDs:         2.60 cm   LV e' medial:    7.29 cm/s LV PW:         1.10 cm   LV E/e' medial:  12.9 LV IVS:        1.10 cm   LV e' lateral:   7.51 cm/s LVOT diam:     2.20 cm   LV  E/e' lateral: 12.5 LV SV:         74 LV SV Index:   41 LVOT Area:     3.80 cm  RIGHT VENTRICLE RV Basal diam:  3.55 cm RV Mid diam:    3.30 cm RV S prime:     14.40 cm/s TAPSE (M-mode): 3.0 cm LEFT ATRIUM             Index        RIGHT ATRIUM           Index LA diam:        3.20 cm 1.80 cm/m   RA Area:     13.40 cm LA Vol (A2C):   52.1 ml 29.23 ml/m  RA Volume:   34.40 ml  19.30 ml/m LA Vol (A4C):   28.9 ml 16.21 ml/m LA Biplane Vol: 38.6 ml 21.65 ml/m  AORTIC VALVE AV Area (Vmax):    3.18 cm AV Area (Vmean):   3.22 cm AV Area (VTI):     3.18 cm AV Vmax:           140.00 cm/s AV Vmean:          93.700  cm/s AV VTI:            0.232 m AV Peak Grad:      7.8 mmHg AV Mean Grad:      4.0 mmHg LVOT Vmax:         117.00 cm/s LVOT Vmean:        79.300 cm/s LVOT VTI:          0.194 m LVOT/AV VTI ratio: 0.84  AORTA Ao Root diam: 3.40 cm Ao Asc diam:  3.00 cm MITRAL VALVE                TRICUSPID VALVE MV Area (PHT): 3.99 cm     TR Peak grad:   48.2 mmHg MV Decel Time: 190 msec     TR Vmax:        347.00 cm/s MV E velocity: 93.80 cm/s MV A velocity: 119.00 cm/s  SHUNTS MV E/A ratio:  0.79         Systemic VTI:  0.19 m                             Systemic Diam: 2.20 cm Dorn Ross MD Electronically signed by Dorn Ross MD Signature Date/Time: 08/07/2023/12:33:53 PM    Final    CT CHEST WO CONTRAST Result Date: 08/07/2023 CLINICAL DATA:  History of CLL.  Hematologic malignancy staging. EXAM: CT CHEST WITHOUT CONTRAST TECHNIQUE: Multidetector CT imaging of the chest was performed following the standard protocol without IV contrast. RADIATION DOSE REDUCTION: This exam was performed according to the departmental dose-optimization program which includes automated exposure control, adjustment of the mA and/or kV according to patient size and/or use of iterative reconstruction technique. COMPARISON:  CT abdomen and pelvis 08/06/2023.  PET-CT 03/05/2023 FINDINGS: Cardiovascular: Heart size is normal without  significant pericardial fluid. Coronary artery calcifications. Atherosclerotic calcifications in thoracic aorta without aneurysm. Mediastinum/Nodes: Compared to the PET-CT from 03/05/2023, there has been enlargement lymph nodes throughout the chest. Nodal tissue in the left supraclavicular area adjacent to the left thyroid  lobe measures 3.2 cm in the short axis on image 17/2 and previously measured 2.7 cm in the short axis. Prominent nodal tissue along the right side of the mediastinum on image 38/2 measures 3.2 cm in the short axis and previously measured 2.9 cm. Index left axillary lymph node on image 36/2 measures 2.6 cm in the short axis previously measured 2.0 cm. This new or increased soft tissue along the posterior mediastinum and paraspinal region on image 88/2 that measures 2.2 cm in the short axis. There is also soft tissue along the right side of the posterior mediastinum and paraspinal region. Enlarged lymph nodes in the cardiophrenic fat. Enlarged right internal mammary lymph nodes. Lungs/Pleura: Trachea and mainstem bronchi are patent. Trace left pleural fluid with new pleural-based thickening particularly along the paraspinal regions. Volume loss in the posterior lower lobes particularly on the left side. Subtle ground-glass opacities along the anterior right lung adjacent to the right internal mammary chain are nonspecific but favor atelectasis. No suspicious pulmonary nodules. Upper Abdomen: Extensive lymphadenopathy in the upper abdomen. Spleen appears to be enlarged. Musculoskeletal: No acute bone abnormality. No suspicious osseous lesions. IMPRESSION: 1. Progression of the lymph node enlargement throughout the chest compared to the PET-CT from 03/05/2023. Diffuse lymphadenopathy throughout the chest as described. 2. Markedly increased nodal tissue and soft tissue thickening in the posterior mediastinum and paraspinal regions. 3. Volume loss in the posterior lower lobes  particularly on the left  side. Trace left pleural fluid. 4.  Aortic Atherosclerosis (ICD10-I70.0). Electronically Signed   By: Juliene Balder M.D.   On: 08/07/2023 09:28   MR LUMBAR SPINE WO CONTRAST Result Date: 08/06/2023 CLINICAL DATA:  Myelopathy, acute, lumbar spine. EXAM: MRI LUMBAR SPINE WITHOUT CONTRAST TECHNIQUE: Multiplanar, multisequence MR imaging of the lumbar spine was performed. No intravenous contrast was administered. COMPARISON:  None Available. FINDINGS: Segmentation:  Standard. Alignment:  No substantial sagittal subluxation. Vertebrae: Diffusely low T1 signal throughout the bone marrow with heterogeneous signal in the sacral vertebral bodies. No evidence of acute fracture or discitis/osteomyelitis. Conus medullaris and cauda equina: Conus extends to the L1-L2 level. Conus and cauda equina appear normal. Paraspinal and other soft tissues: There appears to be intermediate extraosseous signal surrounding the vertebral bodies at multiple levels (for example see series 9, image 28 and series 9, image 20), possibly extraosseous extension of tumor but not well assessed without contrast. Please see same day CT of the abdomen/pelvis for description of retroperitoneal nodal mass and other intra-abdominal intrapelvic findings. Disc levels: T12-L1: No significant disc protrusion, foraminal stenosis, or canal stenosis. L1-L2: No significant disc protrusion, foraminal stenosis, or canal stenosis. L2-L3: No significant disc protrusion, foraminal stenosis, or canal stenosis. L3-L4: No significant disc protrusion, foraminal stenosis, or canal stenosis. L4-L5: Mild disc bulge and endplate spurring without significant stenosis. L5-S1: Question subtle intermediate signal along the posterior aspect of the L5 vertebral body which partially effaces ventral CSF. Mild disc bulging at the disc level without significant canal or foraminal stenosis. Sacral canal: Question mild intermediate signal in the ventral canal without compressive stenosis.  IMPRESSION: 1. Diffusely abnormal marrow signal, likely related to the patient's underlying lymphoma. 2. Masslike extraosseous paraspinal signal abnormality at multiple levels, and L4 and subtle, equivacal signal in the ventral canal in the lower lumbar canal and sacral canal could potentially represent extraosseous extension of tumor but is incompletely assessed without contrast. If the patient is able, consider postcontrast imaging. 3. No compressive canal or foraminal stenosis. Electronically Signed   By: Gilmore GORMAN Molt M.D.   On: 08/06/2023 19:37   CT ABDOMEN PELVIS WO CONTRAST Result Date: 08/06/2023 CLINICAL DATA:  Chronic lymphocytic leukemia. Acute abdominal pain. * Tracking Code: BO * EXAM: CT ABDOMEN AND PELVIS WITHOUT CONTRAST TECHNIQUE: Multidetector CT imaging of the abdomen and pelvis was performed following the standard protocol without IV contrast. RADIATION DOSE REDUCTION: This exam was performed according to the departmental dose-optimization program which includes automated exposure control, adjustment of the mA and/or kV according to patient size and/or use of iterative reconstruction technique. COMPARISON:  PET-CT 03/05/2023 FINDINGS: Lower chest: Thickened paraspinal tissue in the posterior mediastinum (image 8/2). Findings consistent lymphoma. Hepatobiliary: No focal hepatic lesion. Normal gallbladder. No biliary duct dilatation. Common bile duct is normal. Pancreas: Pancreas is normal. No ductal dilatation. No pancreatic inflammation. Spleen: Spleen is mildly enlarged at 14. Cm in craniocaudad dimension Adrenals/urinary tract: Adrenal glands and kidneys are normal. The ureters and bladder normal. Stomach/Bowel: Stomach, small bowel, appendix, and cecum are normal. The colon and rectosigmoid colon are normal. Vascular/Lymphatic: Bulky retroperitoneal lymphadenopathy increased from comparison exam. Retroperitoneal nodal mass at the level of the lower pole LEFT kidney LEFT aorta measures  7.4 cm (image 46/2) compared to 5.1 cm comparison PET-CT scan. The aorta is elevated by 19 mm by retroperitoneal adenopathy compared to 8 mm on prior. Example node ventral to the aorta measuring 33 mm on image 52 compares to 23 mm. Bulky  adenopathy extends along the iliac chains. LEFT external iliac lymph node anterior to the operator space measuring 22 mm compared to 13 mm. Minimal inguinal adenopathy. Reproductive: Prostate unremarkable Other: No free fluid. Musculoskeletal: No aggressive osseous lesion. Posterior fusion of the L4 spinous processes IMPRESSION: 1. Progression of lymphoma lymphadenopathy with increase in bulky retroperitoneal nodal mass. 2. Increase in perispinal thickening in the posterior mediastinum. 3. Mild splenomegaly. 4. No bowel obstruction. Electronically Signed   By: Jackquline Boxer M.D.   On: 08/06/2023 18:20     Discharge Exam: Vitals:   08/17/23 0510 08/17/23 1311  BP: 133/68 132/66  Pulse: 85 88  Resp: 18 16  Temp: 97.8 F (36.6 C) 98 F (36.7 C)  SpO2: 100% 100%   Vitals:   08/16/23 1323 08/16/23 2043 08/17/23 0510 08/17/23 1311  BP: 119/64 124/62 133/68 132/66  Pulse: 90 87 85 88  Resp: 16 18 18 16   Temp: 97.7 F (36.5 C) 97.6 F (36.4 C) 97.8 F (36.6 C) 98 F (36.7 C)  TempSrc: Oral     SpO2: 100% 100% 100% 100%  Weight:      Height:        General: Pt is alert, awake, not in acute distress Cardiovascular: RRR, S1/S2 +, no rubs, no gallops Respiratory: CTA bilaterally, no wheezing, no rhonchi Abdominal: Soft, NT, ND, bowel sounds + Extremities: no edema, no cyanosis    The results of significant diagnostics from this hospitalization (including imaging, microbiology, ancillary and laboratory) are listed below for reference.     Microbiology: No results found for this or any previous visit (from the past 240 hours).   Labs: BNP (last 3 results) No results for input(s): BNP in the last 8760 hours. Basic Metabolic Panel: Recent Labs   Lab 08/11/23 0444 08/11/23 1419 08/13/23 0428 08/15/23 0412  NA 134* 132* 129* 133*  K 3.8 4.1 4.2 4.0  CL 107 98 98 100  CO2 20* 23 22 23   GLUCOSE 120* 199* 291* 270*  BUN 19 20 34* 30*  CREATININE 0.80 0.97 0.81 0.75  CALCIUM 8.7* 9.8 8.6* 8.5*   Liver Function Tests: Recent Labs  Lab 08/11/23 0444 08/11/23 1419  AST 13* 17  ALT 8 8  ALKPHOS 53 71  BILITOT 0.8 1.4*  PROT 5.3* 7.3  ALBUMIN 2.8* 3.7   No results for input(s): LIPASE, AMYLASE in the last 168 hours. No results for input(s): AMMONIA in the last 168 hours. CBC: Recent Labs  Lab 08/13/23 0428 08/14/23 0414 08/15/23 0412 08/16/23 0420 08/17/23 0423  WBC 5.3 7.1 8.7 10.3 14.6*  NEUTROABS 2.0 2.4 2.6 2.7 3.2  HGB 7.9* 7.4* 7.8* 8.1* 8.2*  HCT 24.0* 22.4* 23.3* 24.7* 24.7*  MCV 95.6 94.9 95.9 96.1 96.1  PLT 37* 41* 49* 54* 59*   Cardiac Enzymes: No results for input(s): CKTOTAL, CKMB, CKMBINDEX, TROPONINI in the last 168 hours. BNP: Invalid input(s): POCBNP CBG: Recent Labs  Lab 08/16/23 2040 08/17/23 0732 08/17/23 0753 08/17/23 0837 08/17/23 1149  GLUCAP 276* 56* 60* 109* 127*   D-Dimer No results for input(s): DDIMER in the last 72 hours. Hgb A1c No results for input(s): HGBA1C in the last 72 hours. Lipid Profile No results for input(s): CHOL, HDL, LDLCALC, TRIG, CHOLHDL, LDLDIRECT in the last 72 hours. Thyroid  function studies No results for input(s): TSH, T4TOTAL, T3FREE, THYROIDAB in the last 72 hours.  Invalid input(s): FREET3 Anemia work up No results for input(s): VITAMINB12, FOLATE, FERRITIN, TIBC, IRON, RETICCTPCT in the last 72  hours. Urinalysis    Component Value Date/Time   COLORURINE YELLOW 08/06/2023 2000   APPEARANCEUR HAZY (A) 08/06/2023 2000   LABSPEC 1.015 08/06/2023 2000   PHURINE 5.0 08/06/2023 2000   GLUCOSEU NEGATIVE 08/06/2023 2000   HGBUR NEGATIVE 08/06/2023 2000   HGBUR negative 07/09/2009 0853    BILIRUBINUR NEGATIVE 08/06/2023 2000   BILIRUBINUR n 06/30/2016 1551   KETONESUR NEGATIVE 08/06/2023 2000   PROTEINUR 30 (A) 08/06/2023 2000   UROBILINOGEN 0.2 06/30/2016 1551   UROBILINOGEN 0.2 07/09/2009 0853   NITRITE NEGATIVE 08/06/2023 2000   LEUKOCYTESUR NEGATIVE 08/06/2023 2000   Sepsis Labs Recent Labs  Lab 08/14/23 0414 08/15/23 0412 08/16/23 0420 08/17/23 0423  WBC 7.1 8.7 10.3 14.6*   Microbiology No results found for this or any previous visit (from the past 240 hours).  FURTHER DISCHARGE INSTRUCTIONS:   Get Medicines reviewed and adjusted: Please take all your medications with you for your next visit with your Primary MD   Laboratory/radiological data: Please request your Primary MD to go over all hospital tests and procedure/radiological results at the follow up, please ask your Primary MD to get all Hospital records sent to his/her office.   In some cases, they will be blood work, cultures and biopsy results pending at the time of your discharge. Please request that your primary care M.D. goes through all the records of your hospital data and follows up on these results.   Also Note the following: If you experience worsening of your admission symptoms, develop shortness of breath, life threatening emergency, suicidal or homicidal thoughts you must seek medical attention immediately by calling 911 or calling your MD immediately  if symptoms less severe.   You must read complete instructions/literature along with all the possible adverse reactions/side effects for all the Medicines you take and that have been prescribed to you. Take any new Medicines after you have completely understood and accpet all the possible adverse reactions/side effects.    patient was instructed, not to drive, operate heavy machinery, perform activities at heights, swimming or participation in water activities or provide baby-sitting services while on Pain, Sleep and Anxiety Medications;  until their outpatient Physician has advised to do so again. Also recommended to not to take more than prescribed Pain, Sleep and Anxiety Medications.  It is not advisable to combine anxiety, sleep and pain medications without talking with your primary care provider.     Wear Seat belts while driving.   Please note: You were cared for by a hospitalist during your hospital stay. Once you are discharged, your primary care physician will handle any further medical issues. Please note that NO REFILLS for any discharge medications will be authorized once you are discharged, as it is imperative that you return to your primary care physician (or establish a relationship with a primary care physician if you do not have one) for your post hospital discharge needs so that they can reassess your need for medications and monitor your lab values  Time coordinating discharge: Over 30 minutes  SIGNED:   Fredia Skeeter, MD  [facility] 08/17/2023, 3:05 PM *Please note that this is a verbal dictation therefore any spelling or grammatical errors are due to the Dragon Medical One system interpretation. If 7PM-7AM, please contact night-coverage [email or web address]

## 2023-08-17 NOTE — TOC Transition Note (Addendum)
 Transition of Care [facility]) - Discharge Note   Patient Details  Name: Tyler Scott MRN: [id number] Date of Birth: 26-Sep-1955  Transition of Care [facility]) CM/SW Contact:  Doneta Glenys DASEN, RN Phone Number: 08/17/2023, 3:35 PM   Clinical Narrative:    CM visited with patient in the room, explained PT has changed recommendation to [facility] PT and that [facility] denied SNF. Patient is agreeable to [facility] PT. Choice given; [facility] for PT and [employer] for walker that will be delivered to patients room prior to discharge.  HH and DME agency information added to AVS. Informed MD to sent all medications to [facility] for delivery to bed.   Spouse requested bed to be delivered to the home prior to discharge. Hospital bed requested with [employer] to deliver tomorrow.  MD updated.   Final next level of care: Home w Home Health Services Barriers to Discharge: Barriers Resolved   Patient Goals and CMS Choice Patient states their goals for this hospitalization and ongoing recovery are:: Home with Home Health CMS Medicare.gov Compare Post Acute Care list provided to:: Patient Choice offered to / list presented to : Patient [place] ownership interest in [facility].provided to:: Parent NA    Discharge Placement                    Patient and family notified of of transfer: 08/17/23  Discharge Plan and Services Additional resources added to the After Visit Summary for   In-house Referral: NA Discharge Planning Services: CM Consult            DME Arranged: Vannie rolling DME Agency: [employer] Date DME Agency Contacted: 08/17/23 Time DME Agency Contacted: 1520 Representative spoke with at DME Agency: [place] HH Arranged: PT HH Agency: Other - See comment ([place]) Date HH Agency Contacted: 08/17/23 Time HH Agency Contacted: 1510 Representative spoke with at [facility] Agency: [facility]  Social Drivers of Health (SDOH) Interventions SDOH Screenings   Food  Insecurity: No Food Insecurity (08/06/2023)  Housing: Low Risk  (08/10/2023)  Transportation Needs: No Transportation Needs (08/06/2023)  Utilities: Not At Risk (08/06/2023)  Social Connections: Socially Isolated (08/06/2023)  Tobacco Use: Low Risk  (08/06/2023)     Readmission Risk Interventions    08/07/2023    8:49 AM  Readmission Risk Prevention Plan  Transportation Screening Complete  PCP or Specialist Appt within 5-7 Days Complete  Home Care Screening Complete  Medication Review (RN CM) Complete

## 2023-08-17 NOTE — Plan of Care (Signed)
  Problem: Coping: Goal: Ability to adjust to condition or change in health will improve Outcome: Progressing   Problem: Health Behavior/Discharge Planning: Goal: Ability to identify and utilize available resources and services will improve Outcome: Progressing   

## 2023-08-17 NOTE — Progress Notes (Signed)
 Physical Therapy Treatment Patient Details Name: Tyler Scott MRN: [id number] DOB: 11/07/1955 Today's Date: 08/17/2023   History of Present Illness 68 year old male who presented with a complaint of progressive low back pain, not able to walk, decreased oral intake.  Lab work on presentation showed pancytopenia.  CT abdomen/pelvis showed progression of lymphoma lymphadenopathy with increase in bulky retroperitoneal nodal mass.  MRI of the lumbar spine showed Masslike extraosseous paraspinal signal abnormality at multiple  Levels.  Oncology consulted.  Plan for possible lymph node biopsy, Port-A-Cath placement for starting of chemotherapy.  PMHx:  insulin -dependent type type II, hypertension, CLL    PT Comments  Pt walking in room and opens door without AD upon therapist's arrival. Pt amb around room without AD and around unit with RW, modified ind without LOB, able to navigate around obstacles, complete turns and complete head turns without drifting/veering noted. Pt reports bed on 2nd level; details 4 steps with bil handrail then additional steps with single R ascending handrail. Completed stair training with bil handrail and single R ascending handrail/L descending handrail, supv for safety, alternating step pattern, no LOB, no cues or assistance needed. 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 Continue to recommend HHPT at d/c.   If plan is discharge home, recommend the following: Assistance with cooking/housework;Assist for transportation;Help with stairs or ramp for entrance   Can travel by private vehicle        Equipment Recommendations  Rolling walker (2 wheels)    Recommendations for Other Services       Precautions / Restrictions Precautions Precautions: Fall Recall of Precautions/Restrictions: Intact Restrictions Weight Bearing Restrictions Per Provider Order: No     Mobility  Bed  Mobility Overal bed mobility: Independent                  Transfers Overall transfer level: Independent Equipment used: None Transfers: Sit to/from [employer] transfer comment: pt stands from EOB without AD and recliner, once standing walks over to RW, hands pushing from seated surfaces to rise up    Ambulation/Gait Ambulation/Gait assistance: Modified independent (Device/Increase time) Gait Distance (Feet): 300 Feet Assistive device: Rolling walker (2 wheels) Gait Pattern/deviations: Step-through pattern, Decreased stride length Gait velocity: slightly decreased     General Gait Details: pt amb in room without AD and using RW in the hallway, cadence slightly decreased, able to clear past obstcles and complete turns without LOB, step through gait pattern   Stairs Stairs: Yes Stairs assistance: Supervision Stair Management: Alternating pattern Number of Stairs: 10 General stair comments: pt ascends 4 steps with bil handrail then 6 steps with single R handrail, then descends 10 steps with single L handrail to simulate home setup per pt reports; supv to complete, alternating sequencing pattern, no cues from therapist, no dyspnea noted, no LOB   Wheelchair Mobility     Tilt Bed    Modified Rankin (Stroke Patients Only)       Balance Overall balance assessment: No apparent balance deficits (not formally assessed)                                          Communication Communication Communication: No apparent difficulties  Cognition Arousal: Alert Behavior During Therapy: [facility] for tasks assessed/performed  PT - Cognitive impairments: No apparent impairments                         Following commands: Intact      Cueing Cueing Techniques: Verbal cues  Exercises      General Comments        Pertinent Vitals/Pain Pain Assessment Pain Assessment: No/denies pain    Home Living                           Prior Function            PT Goals (current goals can now be found in the care plan section) Acute Rehab PT Goals Patient Stated Goal: Get stronger PT Goal Formulation: With patient/family Time For Goal Achievement: 08/21/23 Potential to Achieve Goals: Good Progress towards PT goals: Progressing toward goals    Frequency    Min 3X/week      PT Plan      Co-evaluation              AM-PAC PT 6 Clicks Mobility   Outcome Measure  Help needed turning from your back to your side while in a flat bed without using bedrails?: None Help needed moving from lying on your back to sitting on the side of a flat bed without using bedrails?: None Help needed moving to and from a bed to a chair (including a wheelchair)?: None Help needed standing up from a chair using your arms (e.g., wheelchair or bedside chair)?: None Help needed to walk in hospital room?: None Help needed climbing 3-5 steps with a railing? : A Little 6 Click Score: 23    End of Session Equipment Utilized During Treatment: Gait belt Activity Tolerance: Patient tolerated treatment well Patient left: in chair;with call bell/phone within reach (no alarm on arrival,pt up in room without assist) Nurse Communication: Mobility status PT Visit Diagnosis: Difficulty in walking, not elsewhere classified (R26.2);Unsteadiness on feet (R26.81);Muscle weakness (generalized) (M62.81)     Time: 8457-8440 PT Time Calculation (min) (ACUTE ONLY): 17 min  Charges:    $Gait Training: 8-22 mins PT General Charges $$ ACUTE PT VISIT: 1 Visit                     Tori Dorinne Graeff PT, DPT 08/17/23, 4:27 PM

## 2023-08-18 ENCOUNTER — Other Ambulatory Visit: Payer: Self-pay | Admitting: Physician Assistant

## 2023-08-18 ENCOUNTER — Other Ambulatory Visit: Payer: Self-pay

## 2023-08-18 DIAGNOSIS — N179 Acute kidney failure, unspecified: Secondary | ICD-10-CM | POA: Diagnosis not present

## 2023-08-18 DIAGNOSIS — C9110 Chronic lymphocytic leukemia of B-cell type not having achieved remission: Secondary | ICD-10-CM

## 2023-08-18 LAB — CBC WITH DIFFERENTIAL/PLATELET
Abs Immature Granulocytes: 0.18 K/uL — ABNORMAL HIGH (ref 0.00–0.07)
Basophils Absolute: 0.1 K/uL (ref 0.0–0.1)
Basophils Relative: 1 %
Eosinophils Absolute: 0 K/uL (ref 0.0–0.5)
Eosinophils Relative: 0 %
HCT: 23.8 % — ABNORMAL LOW (ref 39.0–52.0)
Hemoglobin: 8 g/dL — ABNORMAL LOW (ref 13.0–17.0)
Immature Granulocytes: 1 %
Lymphocytes Relative: 65 %
Lymphs Abs: 9.6 K/uL — ABNORMAL HIGH (ref 0.7–4.0)
MCH: 32.1 pg (ref 26.0–34.0)
MCHC: 33.6 g/dL (ref 30.0–36.0)
MCV: 95.6 fL (ref 80.0–100.0)
Monocytes Absolute: 1.9 K/uL — ABNORMAL HIGH (ref 0.1–1.0)
Monocytes Relative: 13 %
Neutro Abs: 2.9 K/uL (ref 1.7–7.7)
Neutrophils Relative %: 20 %
Platelets: 60 K/uL — ABNORMAL LOW (ref 150–400)
RBC: 2.49 MIL/uL — ABNORMAL LOW (ref 4.22–5.81)
RDW: 15.6 % — ABNORMAL HIGH (ref 11.5–15.5)
WBC: 14.6 K/uL — ABNORMAL HIGH (ref 4.0–10.5)
nRBC: 0.6 % — ABNORMAL HIGH (ref 0.0–0.2)

## 2023-08-18 LAB — GLUCOSE, CAPILLARY
Glucose-Capillary: 73 mg/dL (ref 70–99)
Glucose-Capillary: 122 mg/dL — ABNORMAL HIGH (ref 70–99)

## 2023-08-18 NOTE — Discharge Summary (Signed)
 Physician Discharge Summary  Tyler Scott FMW:[id number] DOB: Jan 09, 1956 DOA: 08/06/2023  PCP: [name], MD  Admit date: 08/06/2023 Discharge date: 08/18/2023 30 Day Unplanned Readmission Risk Score    Flowsheet Row ED to Hosp-Admission (Current) from 08/06/2023 in [facility]  30 Day Unplanned Readmission Risk Score (%) 22.89 Filed at 08/17/2023 1200    This score is the patient's risk of an unplanned readmission within 30 days of being discharged (0 -100%). The score is based on dignosis, age, lab data, medications, orders, and past utilization.   Low:  0-14.9   Medium: 15-21.9   High: 22-29.9   Extreme: 30 and above          Admitted From: Home Disposition: Home  Recommendations for Outpatient Follow-up:  Follow up with PCP in 1-2 weeks Please obtain BMP/CBC in one week Follow-up with oncology for the start of chemotherapy. Please follow up with your PCP on the following pending results: Unresulted Labs (From admission, onward)     Start     Ordered   08/11/23 0000  CMP (Cancer Center only)  STAT        08/11/23 2346   08/11/23 0000  CBC with Differential (Cancer Center Only)  STAT        08/11/23 2355   08/11/23 0000  CMP (Cancer Center only)  STAT        08/11/23 2355   Unscheduled  Hepatitis B surface antigen  STAT      08/11/23 2345   Unscheduled  Hepatitis B core antibody, total  STAT      08/11/23 2345              Home Health: Yes Equipment/Devices: None   Discharge Condition: Stable CODE STATUS: Full code Diet recommendation: Cardiac  Subjective: Seen and examined.  Feels well.  No complaints.  Ready to go home.  Brief/Interim Summary: Patient is a 68 year old male with history of insulin -dependent type type II, hypertension, CLL who presented with a complaint of progressive low back pain, not able to walk, decreased oral intake.  Lab work on presentation showed pancytopenia.  CT abdomen/pelvis  showed  Progression of lymphoma lymphadenopathy with increase in bulky retroperitoneal nodal mass.  MRI of the lumbar spine showed Masslike extraosseous paraspinal signal abnormality at multiple levels.  Oncology consulted and following.  S/P  lymph node biopsy   CLL/pancytopenia/SLL: Presented with low back pain.CT abdomen/pelvis showed  progression of lymphoma lymphadenopathy with increase in bulky retroperitoneal nodal mass.  MRI of the lumbar spine showed masslike extraosseous paraspinal signal abnormality at multiple levels.  History of CLL, was on different therapies.  Has adenopathy in the inguinal area.  Oncology suspecting transformation of CLL to aggressive lymphoma.  Suspicion of bone marrow involvement.  Given rasburicase   for high uric acid  level.  Oncology eventually planning for starting chemotherapy, Port-A-Cath placement. S/P retroperitoneal LN biopsy on 6/30.  path showed low-grade lymphoma.  Patient was given 2 doses of IVIG and high-dose of prednisone  with recommendations to taper in next few days.  Hemoglobin was 6.5 on 08/11/2023 for which she was given 1 unit of PRBC transfusion, hemoglobin improved to over 9 posttransfusion and then dropped to 7.4 but improved to 8.1 today without transfusion.  Patient's hemoglobin has remained stable.  Patient has been cleared for discharge from oncology and they will start chemotherapy as outpatient. Chemotherapy regimen with Bendamustine /rituximab  orders placed to start as outpatient along with weekly Nplate  AKI: No obstructive pathology as per CT abdomen/pelvis.  UA unremarkable.  Hydrated.  Renal function normalized.   Hyponatremia: Received gentle hydration with normal saline yesterday, sodium improved to 132 yesterday but down to 133.   Hypertension: Taking lisinopril  at home.  Started on amlodipine  due to AKI.  Blood pressure controlled.  Discharging on amlodipine  instead of lisinopril .   Insulin -dependent diabetes type 2: A1c of  5.8.  Currently hyperglycemic on Lantus  due to prednisone .  He is going home with tapering dose so hopefully he will be fine without insulin .   Failure to thrive/generalized weakness/disposition: PT consulted, recommend CIR however, Per CIR, they spoke with patient's wife by phone and wife works a lot and does not have anyone who can stay with the patient after potential GIST and wife wanted him to go to SNF.  We were waiting for insurance authorization for several days.  Was informed by [facility] today that insurance denied his SNF as well.  It was noted afterwards that patient was last seen by PT on 08/14/2023 and they recommended home with home health however he was seen by OT and they continued to recommend SNF only because patient wanted to go to SNF.  Discussed with PT today, they recommended home with home health.  TOC discussed this with the patient.  He is willing to go home.  Discharging home with home health.   Constipation: Resolved.  Addendum 08/18/2023.  Patient was discharged afternoon of 08/17/2023 since patient was cleared by PT OT to go home and patient was in agreement however after discharge paperwork was completed, patient's wife was called by the staff, she requested hospital bed and did not want patient to be discharged until hospital bed is delivered.  Discharge was held until the bed is delivered.  TOC also contacted the delivery company.  However I was informed today that wife has now canceled the bed stating that the patient himself does not want any bed on the floor downstairs.  So patient is going home today.   Discharge Diagnoses:  Principal Problem:   AKI (acute kidney injury) (HCC) Active Problems:   CLL (chronic lymphocytic leukemia) (HCC)   Insulin  dependent type 2 diabetes mellitus (HCC)   Pancytopenia (HCC)   Lymphoma [facility])    Discharge Instructions  Discharge Instructions     Clinic Appointment Request   Complete by: Aug 16, 2023    Contact your oncology clinic or  infusion center to schedule this appointment.   Infusion Appointment Request (345 min)   Complete by: Aug 16, 2023    Contact your oncology clinic or infusion center to schedule this appointment.   Lab Appointment Request   Complete by: Aug 16, 2023    Patient requires a Lab or Flush Appointment?: Lab Appointment   Contact your oncology clinic or infusion center to schedule this appointment.   Infusion Appointment Request   Complete by: Aug 17, 2023    Contact your oncology clinic or infusion center to schedule this appointment.   ONCBCN PHYSICIAN COMMUNICATION 1   Complete by: As directed    For pathway [id number]: treatment x 6 Cycles (or 2 Cycles Beyond CR).   PHYSICIAN COMMUNICATION ORDER   Complete by: As directed    Hepatitis B Virus screening with HBsAg and anti-HBc recommended prior to treatment with rituximab , ofatumumab, or obinutuzumab .   Clinic Appointment Request   Complete by: Sep 13, 2023    Contact your oncology clinic or infusion center to schedule this appointment.  Infusion Appointment Request   Complete by: Sep 13, 2023    Contact your oncology clinic or infusion center to schedule this appointment.   Lab Appointment Request   Complete by: Sep 13, 2023    Patient requires a Lab or Flush Appointment?: Lab Appointment   Contact your oncology clinic or infusion center to schedule this appointment.   Infusion Appointment Request   Complete by: Sep 14, 2023    Contact your oncology clinic or infusion center to schedule this appointment.      Allergies as of 08/18/2023   No Known Allergies      Medication List     STOP taking these medications    lisinopril  40 MG tablet Commonly known as: ZESTRIL    venetoclax  10 & 50 & 100 MG Starter Pack Commonly known as: VENCLEXTA        TAKE these medications    acyclovir  400 MG tablet Commonly known as: ZOVIRAX  Take 1 tablet (400 mg total) by mouth daily.   allopurinol  300 MG tablet Commonly known as:  ZYLOPRIM  Take 0.5 tablets (150 mg total) by mouth daily.   amLODipine  10 MG tablet Commonly known as: NORVASC  Take 1 tablet (10 mg total) by mouth daily for 10 days.   Cholecalciferol 50 MCG (2000 UT) Tabs TAKE ONE TABLET BY MOUTH DAILY FOR LOW VITAMIN D.   doxepin  25 MG capsule Commonly known as: SINEQUAN  Take 1 capsule by mouth at bedtime.   empagliflozin 25 MG Tabs tablet Commonly known as: JARDIANCE Take 0.5 tablets by mouth every morning. Only if blood sugars get too high   finasteride  5 MG tablet Commonly known as: PROSCAR  Take 0.5 tablets by mouth daily.   insulin  glargine 100 UNIT/ML Solostar Pen Commonly known as: Lantus  SoloStar INJECT 20 UNITS SUBCUTANEOUSLY AT BEDTIME   ondansetron  8 MG tablet Commonly known as: Zofran  Take 1 tablet (8 mg total) by mouth every 8 (eight) hours as needed for nausea or vomiting. Start on the third day after chemotherapy.   ONE TOUCH ULTRA 2 w/Device Kit See admin instructions.   ONE TOUCH ULTRA TEST test strip Generic drug: glucose blood TEST GLUCOSE 3 TIMES A DAY   OneTouch Delica Lancets 33G Misc TEST GLUCOSE 3 TIMES A DAY   oxyCODONE  5 MG immediate release tablet Commonly known as: Oxy IR/ROXICODONE  Take 1 tablet (5 mg total) by mouth every 4 (four) hours as needed for moderate pain (pain score 4-6).   predniSONE  10 MG tablet Commonly known as: DELTASONE  Take 4 tablets (40 mg total) by mouth daily for 2 days, THEN 3 tablets (30 mg total) daily for 2 days, THEN 2 tablets (20 mg total) daily for 2 days, THEN 1 tablet (10 mg total) daily for 2 days. Start taking on: August 17, 2023   prochlorperazine  10 MG tablet Commonly known as: COMPAZINE  Take 1 tablet (10 mg total) by mouth every 6 (six) hours as needed for nausea or vomiting.   simvastatin  20 MG tablet Commonly known as: ZOCOR  TAKE 1 TABLET (20 MG TOTAL) BY MOUTH AT BEDTIME.               Durable Medical Equipment  (From admission, onward)            Start     Ordered   08/17/23 1602  For home use only DME Hospital bed  Once       Question Answer Comment  Length of Need Lifetime   Patient has (list medical condition): Generalized weakness/deconditioning  Bed type Semi-electric      08/17/23 1601            Contact information for follow-up providers     [name], MD Follow up in 1 week(s).   Specialty: Family Medicine Contact information: [address] [place] [address] [phone]         Onesimo Emaline Brink, MD Follow up in 1 week(s).   Specialties: Hematology, Oncology Contact information: [address] [place] [address] [phone]         [facility] Follow up.   Why: Will deliver walker to patients room proir to discharge.        [facility] Follow up.   Specialty: Home Health Services Why: Will call to arrange home health physical therapy. Contact information: [address] [place] [address] [phone]              Contact information for after-discharge care     Destination     [facility] Preferred SNF .   Service: Skilled Nursing Contact information: [address] [place]  [address] [phone]                    No Known Allergies  Consultations: Oncology   Procedures/Studies: CT BIOPSY Result Date: 08/09/2023 CLINICAL DATA:  Progressive retroperitoneal adenopathy, history of CLL EXAM: CT GUIDED CORE BIOPSY OF LEFT PARA-AORTIC ADENOPATHY ANESTHESIA/SEDATION: Intravenous Fentanyl  200mcg and Versed  4mg  were administered by RN during a total moderate (conscious) sedation time of 25 minutes; the patient's level of consciousness and physiological / cardiorespiratory status were monitored continuously by radiology RN under my direct supervision. PROCEDURE: The procedure risks, benefits, and alternatives were explained to the  patient. Questions regarding the procedure were encouraged and answered. The patient understands and consents to the procedure. patient placed prone. select axial scans through the abdomen obtained, adenopathy localized, and an appropriate skin entry site determined and marked. The operative field was prepped with chlorhexidinein a sterile fashion, and a sterile drape was applied covering the operative field. A sterile gown and sterile gloves were used for the procedure. Local anesthesia was provided with 1% Lidocaine . Under CT fluoroscopic guidance, a 17 gauge trocar needle was advanced to the margin of the lesion. Once needle tip position was confirmed, coaxial 18-gauge core biopsy samples were obtained, submitted in saline to surgical pathology. The guide needle was removed. Postprocedure scans show no hemorrhage or other apparent complication. The patient tolerated the procedure well. RADIATION DOSE REDUCTION: This exam was performed according to the departmental dose-optimization program which includes automated exposure control, adjustment of the mA and/or kV according to patient size and/or use of iterative reconstruction technique. COMPLICATIONS: None immediate FINDINGS: Bulky left para-aortic and aortocaval adenopathy localized. Representative core biopsy samples obtained as above. IMPRESSION: 1. Technically successful CT-guided core biopsy, left para-aortic retroperitoneal adenopathy. Electronically Signed   By: JONETTA Faes M.D.   On: 08/09/2023 15:52   ECHOCARDIOGRAM COMPLETE Result Date: 08/07/2023    ECHOCARDIOGRAM REPORT   Patient Name:   Tyler Scott Date of Exam: 08/07/2023 Medical Rec #:  [id number]           Height:       68.0 in Accession #:    [id number]          Weight:       145.0 lb Date of Birth:  Jul 22, 1955          BSA:  1.783 m Patient Age:    67 years            BP:           136/70 mmHg Patient Gender: M                   HR:           100 bpm. Exam Location:  Inpatient  Procedure: 2D Echo, Cardiac Doppler and Color Doppler (Both Spectral and Color            Flow Doppler were utilized during procedure). Indications:    Chemo  History:        Patient has no prior history of Echocardiogram examinations.                 Risk Factors:Diabetes.  Sonographer:    Benard Stallion Referring Phys: [id number] PETER R ENNEVER IMPRESSIONS  1. Left ventricular ejection fraction, by estimation, is 60 to 65%. The left ventricle has normal function. The left ventricle has no regional wall motion abnormalities. There is mild left ventricular hypertrophy. Left ventricular diastolic parameters are consistent with Grade I diastolic dysfunction (impaired relaxation).  2. Right ventricular systolic function is normal. The right ventricular size is normal.  3. The mitral valve is normal in structure. No evidence of mitral valve regurgitation. No evidence of mitral stenosis.  4. The tricuspid valve is abnormal. Tricuspid valve regurgitation is mild to moderate.  5. The aortic valve is tricuspid. Aortic valve regurgitation is not visualized. No aortic stenosis is present. FINDINGS  Left Ventricle: Left ventricular ejection fraction, by estimation, is 60 to 65%. The left ventricle has normal function. The left ventricle has no regional wall motion abnormalities. The left ventricular internal cavity size was normal in size. There is  mild left ventricular hypertrophy. Left ventricular diastolic parameters are consistent with Grade I diastolic dysfunction (impaired relaxation). Normal left ventricular filling pressure. Right Ventricle: IVC not visualized, not able to estimate PASP. The right ventricular size is normal. Right vetricular wall thickness was not well visualized. Right ventricular systolic function is normal. Left Atrium: Left atrial size was normal in size. Right Atrium: Right atrial size was normal in size. Pericardium: There is no evidence of pericardial effusion. Mitral Valve: The mitral valve is  normal in structure. There is mild thickening of the mitral valve leaflet(s). There is mild calcification of the mitral valve leaflet(s). Mild mitral annular calcification. No evidence of mitral valve regurgitation. No evidence of mitral valve stenosis. Tricuspid Valve: The tricuspid valve is abnormal. Tricuspid valve regurgitation is mild to moderate. No evidence of tricuspid stenosis. Aortic Valve: The aortic valve is tricuspid. Aortic valve regurgitation is not visualized. No aortic stenosis is present. Aortic valve mean gradient measures 4.0 mmHg. Aortic valve peak gradient measures 7.8 mmHg. Aortic valve area, by VTI measures 3.18 cm. Pulmonic Valve: The pulmonic valve was not well visualized. Pulmonic valve regurgitation is not visualized. No evidence of pulmonic stenosis. Aorta: The aortic root and ascending aorta are structurally normal, with no evidence of dilitation. IAS/Shunts: The interatrial septum was not well visualized.  LEFT VENTRICLE PLAX 2D LVIDd:         4.20 cm   Diastology LVIDs:         2.60 cm   LV e' medial:    7.29 cm/s LV PW:         1.10 cm   LV E/e' medial:  12.9 LV IVS:  1.10 cm   LV e' lateral:   7.51 cm/s LVOT diam:     2.20 cm   LV E/e' lateral: 12.5 LV SV:         74 LV SV Index:   41 LVOT Area:     3.80 cm  RIGHT VENTRICLE RV Basal diam:  3.55 cm RV Mid diam:    3.30 cm RV S prime:     14.40 cm/s TAPSE (M-mode): 3.0 cm LEFT ATRIUM             Index        RIGHT ATRIUM           Index LA diam:        3.20 cm 1.80 cm/m   RA Area:     13.40 cm LA Vol (A2C):   52.1 ml 29.23 ml/m  RA Volume:   34.40 ml  19.30 ml/m LA Vol (A4C):   28.9 ml 16.21 ml/m LA Biplane Vol: 38.6 ml 21.65 ml/m  AORTIC VALVE AV Area (Vmax):    3.18 cm AV Area (Vmean):   3.22 cm AV Area (VTI):     3.18 cm AV Vmax:           140.00 cm/s AV Vmean:          93.700 cm/s AV VTI:            0.232 m AV Peak Grad:      7.8 mmHg AV Mean Grad:      4.0 mmHg LVOT Vmax:         117.00 cm/s LVOT Vmean:         79.300 cm/s LVOT VTI:          0.194 m LVOT/AV VTI ratio: 0.84  AORTA Ao Root diam: 3.40 cm Ao Asc diam:  3.00 cm MITRAL VALVE                TRICUSPID VALVE MV Area (PHT): 3.99 cm     TR Peak grad:   48.2 mmHg MV Decel Time: 190 msec     TR Vmax:        347.00 cm/s MV E velocity: 93.80 cm/s MV A velocity: 119.00 cm/s  SHUNTS MV E/A ratio:  0.79         Systemic VTI:  0.19 m                             Systemic Diam: 2.20 cm Dorn Ross MD Electronically signed by Dorn Ross MD Signature Date/Time: 08/07/2023/12:33:53 PM    Final    CT CHEST WO CONTRAST Result Date: 08/07/2023 CLINICAL DATA:  History of CLL.  Hematologic malignancy staging. EXAM: CT CHEST WITHOUT CONTRAST TECHNIQUE: Multidetector CT imaging of the chest was performed following the standard protocol without IV contrast. RADIATION DOSE REDUCTION: This exam was performed according to the departmental dose-optimization program which includes automated exposure control, adjustment of the mA and/or kV according to patient size and/or use of iterative reconstruction technique. COMPARISON:  CT abdomen and pelvis 08/06/2023.  PET-CT 03/05/2023 FINDINGS: Cardiovascular: Heart size is normal without significant pericardial fluid. Coronary artery calcifications. Atherosclerotic calcifications in thoracic aorta without aneurysm. Mediastinum/Nodes: Compared to the PET-CT from 03/05/2023, there has been enlargement lymph nodes throughout the chest. Nodal tissue in the left supraclavicular area adjacent to the left thyroid  lobe measures 3.2 cm in the short axis on image 17/2 and previously measured 2.7 cm in  the short axis. Prominent nodal tissue along the right side of the mediastinum on image 38/2 measures 3.2 cm in the short axis and previously measured 2.9 cm. Index left axillary lymph node on image 36/2 measures 2.6 cm in the short axis previously measured 2.0 cm. This new or increased soft tissue along the posterior mediastinum and paraspinal  region on image 88/2 that measures 2.2 cm in the short axis. There is also soft tissue along the right side of the posterior mediastinum and paraspinal region. Enlarged lymph nodes in the cardiophrenic fat. Enlarged right internal mammary lymph nodes. Lungs/Pleura: Trachea and mainstem bronchi are patent. Trace left pleural fluid with new pleural-based thickening particularly along the paraspinal regions. Volume loss in the posterior lower lobes particularly on the left side. Subtle ground-glass opacities along the anterior right lung adjacent to the right internal mammary chain are nonspecific but favor atelectasis. No suspicious pulmonary nodules. Upper Abdomen: Extensive lymphadenopathy in the upper abdomen. Spleen appears to be enlarged. Musculoskeletal: No acute bone abnormality. No suspicious osseous lesions. IMPRESSION: 1. Progression of the lymph node enlargement throughout the chest compared to the PET-CT from 03/05/2023. Diffuse lymphadenopathy throughout the chest as described. 2. Markedly increased nodal tissue and soft tissue thickening in the posterior mediastinum and paraspinal regions. 3. Volume loss in the posterior lower lobes particularly on the left side. Trace left pleural fluid. 4.  Aortic Atherosclerosis (ICD10-I70.0). Electronically Signed   By: Juliene Balder M.D.   On: 08/07/2023 09:28   MR LUMBAR SPINE WO CONTRAST Result Date: 08/06/2023 CLINICAL DATA:  Myelopathy, acute, lumbar spine. EXAM: MRI LUMBAR SPINE WITHOUT CONTRAST TECHNIQUE: Multiplanar, multisequence MR imaging of the lumbar spine was performed. No intravenous contrast was administered. COMPARISON:  None Available. FINDINGS: Segmentation:  Standard. Alignment:  No substantial sagittal subluxation. Vertebrae: Diffusely low T1 signal throughout the bone marrow with heterogeneous signal in the sacral vertebral bodies. No evidence of acute fracture or discitis/osteomyelitis. Conus medullaris and cauda equina: Conus extends to the  L1-L2 level. Conus and cauda equina appear normal. Paraspinal and other soft tissues: There appears to be intermediate extraosseous signal surrounding the vertebral bodies at multiple levels (for example see series 9, image 28 and series 9, image 20), possibly extraosseous extension of tumor but not well assessed without contrast. Please see same day CT of the abdomen/pelvis for description of retroperitoneal nodal mass and other intra-abdominal intrapelvic findings. Disc levels: T12-L1: No significant disc protrusion, foraminal stenosis, or canal stenosis. L1-L2: No significant disc protrusion, foraminal stenosis, or canal stenosis. L2-L3: No significant disc protrusion, foraminal stenosis, or canal stenosis. L3-L4: No significant disc protrusion, foraminal stenosis, or canal stenosis. L4-L5: Mild disc bulge and endplate spurring without significant stenosis. L5-S1: Question subtle intermediate signal along the posterior aspect of the L5 vertebral body which partially effaces ventral CSF. Mild disc bulging at the disc level without significant canal or foraminal stenosis. Sacral canal: Question mild intermediate signal in the ventral canal without compressive stenosis. IMPRESSION: 1. Diffusely abnormal marrow signal, likely related to the patient's underlying lymphoma. 2. Masslike extraosseous paraspinal signal abnormality at multiple levels, and L4 and subtle, equivacal signal in the ventral canal in the lower lumbar canal and sacral canal could potentially represent extraosseous extension of tumor but is incompletely assessed without contrast. If the patient is able, consider postcontrast imaging. 3. No compressive canal or foraminal stenosis. Electronically Signed   By: Gilmore GORMAN Molt M.D.   On: 08/06/2023 19:37   CT ABDOMEN PELVIS WO CONTRAST Result Date: 08/06/2023  CLINICAL DATA:  Chronic lymphocytic leukemia. Acute abdominal pain. * Tracking Code: BO * EXAM: CT ABDOMEN AND PELVIS WITHOUT CONTRAST  TECHNIQUE: Multidetector CT imaging of the abdomen and pelvis was performed following the standard protocol without IV contrast. RADIATION DOSE REDUCTION: This exam was performed according to the departmental dose-optimization program which includes automated exposure control, adjustment of the mA and/or kV according to patient size and/or use of iterative reconstruction technique. COMPARISON:  PET-CT 03/05/2023 FINDINGS: Lower chest: Thickened paraspinal tissue in the posterior mediastinum (image 8/2). Findings consistent lymphoma. Hepatobiliary: No focal hepatic lesion. Normal gallbladder. No biliary duct dilatation. Common bile duct is normal. Pancreas: Pancreas is normal. No ductal dilatation. No pancreatic inflammation. Spleen: Spleen is mildly enlarged at 14. Cm in craniocaudad dimension Adrenals/urinary tract: Adrenal glands and kidneys are normal. The ureters and bladder normal. Stomach/Bowel: Stomach, small bowel, appendix, and cecum are normal. The colon and rectosigmoid colon are normal. Vascular/Lymphatic: Bulky retroperitoneal lymphadenopathy increased from comparison exam. Retroperitoneal nodal mass at the level of the lower pole LEFT kidney LEFT aorta measures 7.4 cm (image 46/2) compared to 5.1 cm comparison PET-CT scan. The aorta is elevated by 19 mm by retroperitoneal adenopathy compared to 8 mm on prior. Example node ventral to the aorta measuring 33 mm on image 52 compares to 23 mm. Bulky adenopathy extends along the iliac chains. LEFT external iliac lymph node anterior to the operator space measuring 22 mm compared to 13 mm. Minimal inguinal adenopathy. Reproductive: Prostate unremarkable Other: No free fluid. Musculoskeletal: No aggressive osseous lesion. Posterior fusion of the L4 spinous processes IMPRESSION: 1. Progression of lymphoma lymphadenopathy with increase in bulky retroperitoneal nodal mass. 2. Increase in perispinal thickening in the posterior mediastinum. 3. Mild splenomegaly. 4.  No bowel obstruction. Electronically Signed   By: Jackquline Boxer M.D.   On: 08/06/2023 18:20     Discharge Exam: Vitals:   08/17/23 2010 08/18/23 0514  BP: 132/68 129/71  Pulse: 93 83  Resp: 16 16  Temp: 98.2 F (36.8 C) 97.9 F (36.6 C)  SpO2: 99% 100%   Vitals:   08/17/23 0510 08/17/23 1311 08/17/23 2010 08/18/23 0514  BP: 133/68 132/66 132/68 129/71  Pulse: 85 88 93 83  Resp: 18 16 16 16   Temp: 97.8 F (36.6 C) 98 F (36.7 C) 98.2 F (36.8 C) 97.9 F (36.6 C)  TempSrc:      SpO2: 100% 100% 99% 100%  Weight:      Height:        General: Pt is alert, awake, not in acute distress Cardiovascular: RRR, S1/S2 +, no rubs, no gallops Respiratory: CTA bilaterally, no wheezing, no rhonchi Abdominal: Soft, NT, ND, bowel sounds + Extremities: no edema, no cyanosis    The results of significant diagnostics from this hospitalization (including imaging, microbiology, ancillary and laboratory) are listed below for reference.     Microbiology: No results found for this or any previous visit (from the past 240 hours).   Labs: BNP (last 3 results) No results for input(s): BNP in the last 8760 hours. Basic Metabolic Panel: Recent Labs  Lab 08/11/23 1419 08/13/23 0428 08/15/23 0412  NA 132* 129* 133*  K 4.1 4.2 4.0  CL 98 98 100  CO2 23 22 23   GLUCOSE 199* 291* 270*  BUN 20 34* 30*  CREATININE 0.97 0.81 0.75  CALCIUM 9.8 8.6* 8.5*   Liver Function Tests: Recent Labs  Lab 08/11/23 1419  AST 17  ALT 8  ALKPHOS 71  BILITOT 1.4*  PROT 7.3  ALBUMIN 3.7   No results for input(s): LIPASE, AMYLASE in the last 168 hours. No results for input(s): AMMONIA in the last 168 hours. CBC: Recent Labs  Lab 08/14/23 0414 08/15/23 0412 08/16/23 0420 08/17/23 0423 08/18/23 0433  WBC 7.1 8.7 10.3 14.6* 14.6*  NEUTROABS 2.4 2.6 2.7 3.2 2.9  HGB 7.4* 7.8* 8.1* 8.2* 8.0*  HCT 22.4* 23.3* 24.7* 24.7* 23.8*  MCV 94.9 95.9 96.1 96.1 95.6  PLT 41* 49* 54* 59* 60*    Cardiac Enzymes: No results for input(s): CKTOTAL, CKMB, CKMBINDEX, TROPONINI in the last 168 hours. BNP: Invalid input(s): POCBNP CBG: Recent Labs  Lab 08/17/23 0837 08/17/23 1149 08/17/23 1619 08/17/23 2121 08/18/23 0732  GLUCAP 109* 127* 320* 374* 73   D-Dimer No results for input(s): DDIMER in the last 72 hours. Hgb A1c No results for input(s): HGBA1C in the last 72 hours. Lipid Profile No results for input(s): CHOL, HDL, LDLCALC, TRIG, CHOLHDL, LDLDIRECT in the last 72 hours. Thyroid  function studies No results for input(s): TSH, T4TOTAL, T3FREE, THYROIDAB in the last 72 hours.  Invalid input(s): FREET3 Anemia work up No results for input(s): VITAMINB12, FOLATE, FERRITIN, TIBC, IRON, RETICCTPCT in the last 72 hours. Urinalysis    Component Value Date/Time   COLORURINE YELLOW 08/06/2023 2000   APPEARANCEUR HAZY (A) 08/06/2023 2000   LABSPEC 1.015 08/06/2023 2000   PHURINE 5.0 08/06/2023 2000   GLUCOSEU NEGATIVE 08/06/2023 2000   HGBUR NEGATIVE 08/06/2023 2000   HGBUR negative 07/09/2009 0853   BILIRUBINUR NEGATIVE 08/06/2023 2000   BILIRUBINUR n 06/30/2016 1551   KETONESUR NEGATIVE 08/06/2023 2000   PROTEINUR 30 (A) 08/06/2023 2000   UROBILINOGEN 0.2 06/30/2016 1551   UROBILINOGEN 0.2 07/09/2009 0853   NITRITE NEGATIVE 08/06/2023 2000   LEUKOCYTESUR NEGATIVE 08/06/2023 2000   Sepsis Labs Recent Labs  Lab 08/15/23 0412 08/16/23 0420 08/17/23 0423 08/18/23 0433  WBC 8.7 10.3 14.6* 14.6*   Microbiology No results found for this or any previous visit (from the past 240 hours).  FURTHER DISCHARGE INSTRUCTIONS:   Get Medicines reviewed and adjusted: Please take all your medications with you for your next visit with your Primary MD   Laboratory/radiological data: Please request your Primary MD to go over all hospital tests and procedure/radiological results at the follow up, please ask your Primary MD to  get all Hospital records sent to his/her office.   In some cases, they will be blood work, cultures and biopsy results pending at the time of your discharge. Please request that your primary care M.D. goes through all the records of your hospital data and follows up on these results.   Also Note the following: If you experience worsening of your admission symptoms, develop shortness of breath, life threatening emergency, suicidal or homicidal thoughts you must seek medical attention immediately by calling 911 or calling your MD immediately  if symptoms less severe.   You must read complete instructions/literature along with all the possible adverse reactions/side effects for all the Medicines you take and that have been prescribed to you. Take any new Medicines after you have completely understood and accpet all the possible adverse reactions/side effects.    patient was instructed, not to drive, operate heavy machinery, perform activities at heights, swimming or participation in water activities or provide baby-sitting services while on Pain, Sleep and Anxiety Medications; until their outpatient Physician has advised to do so again. Also recommended to not to take more than prescribed Pain, Sleep and Anxiety Medications.  It is not advisable to combine anxiety, sleep and pain medications without talking with your primary care provider.     Wear Seat belts while driving.   Please note: You were cared for by a hospitalist during your hospital stay. Once you are discharged, your primary care physician will handle any further medical issues. Please note that NO REFILLS for any discharge medications will be authorized once you are discharged, as it is imperative that you return to your primary care physician (or establish a relationship with a primary care physician if you do not have one) for your post hospital discharge needs so that they can reassess your need for medications and monitor your lab  values  Time coordinating discharge: Over 30 minutes  SIGNED:   Fredia Skeeter, MD  [facility] 08/18/2023, 8:53 AM *Please note that this is a verbal dictation therefore any spelling or grammatical errors are due to the Dragon Medical One system interpretation. If 7PM-7AM, please contact night-coverage [email or web address]

## 2023-08-18 NOTE — Plan of Care (Signed)
   Problem: Coping: Goal: Ability to adjust to condition or change in health will improve Outcome: Progressing   Problem: Health Behavior/Discharge Planning: Goal: Ability to manage health-related needs will improve Outcome: Progressing

## 2023-08-18 NOTE — Progress Notes (Signed)
 The patient's ride was due to show up between 12-1pm. However, the patient's ride/wife is unable to arrive at that time. The patients's wife is here now. The patient is being transported via w/c to the lobby.

## 2023-08-18 NOTE — Care Management Important Message (Signed)
 Important Message  Patient Details IM Letter given. Name: Tyler Scott MRN: [id number] Date of Birth: 06/03/1955   Important Message Given:  Yes - Medicare IM     Melba Ates 08/18/2023, 9:06 AM

## 2023-08-18 NOTE — TOC Progression Note (Addendum)
 Transition of Care [facility]) - Progression Note    Patient Details  Name: Tyler Scott MRN: [id number] Date of Birth: 08-12-1955  [facility] of [facility]) CM/SW Contact  Doneta Glenys DASEN, RN Phone Number: 08/18/2023, 9:15 AM  Clinical Narrative:    CM just spoke with patients wife,hospital bed has been cancelled. Wife said will pick him up about 12ish.  CM cancelled hospital bed with [employer] [facility]) 12:18 PM CM received from Malinda Talicia/ Home and [facility] after Peer to Peer decision was upheld didn't meet and family can appeal [id number] [phone]  CM informed patient.   Expected Discharge Plan: Home w Home Health Services Barriers to Discharge: Barriers Resolved  Expected Discharge Plan and Services In-house Referral: NA Discharge Planning Services: CM Consult   Living arrangements for the past 2 months: Single Family Home Expected Discharge Date: 08/18/23               DME Arranged: Vannie rolling DME Agency: [employer] Date DME Agency Contacted: 08/17/23 Time DME Agency Contacted: 1520 Representative spoke with at DME Agency: [place] HH Arranged: PT HH Agency: Other - See comment ([place]) Date HH Agency Contacted: 08/17/23 Time HH Agency Contacted: 1510 Representative spoke with at [facility] Agency: [facility]   Social Determinants of Health (SDOH) Interventions SDOH Screenings   Food Insecurity: No Food Insecurity (08/06/2023)  Housing: Low Risk  (08/10/2023)  Transportation Needs: No Transportation Needs (08/06/2023)  Utilities: Not At Risk (08/06/2023)  Social Connections: Socially Isolated (08/06/2023)  Tobacco Use: Low Risk  (08/06/2023)    Readmission Risk Interventions    08/07/2023    8:49 AM  Readmission Risk Prevention Plan  Transportation Screening Complete  PCP or Specialist Appt within 5-7 Days Complete  Home Care Screening Complete  Medication Review (RN CM) Complete

## 2023-08-19 ENCOUNTER — Other Ambulatory Visit (HOSPITAL_COMMUNITY): Payer: Self-pay

## 2023-08-23 ENCOUNTER — Other Ambulatory Visit: Payer: Self-pay

## 2023-08-23 DIAGNOSIS — C9110 Chronic lymphocytic leukemia of B-cell type not having achieved remission: Secondary | ICD-10-CM

## 2023-08-23 DIAGNOSIS — D693 Immune thrombocytopenic purpura: Secondary | ICD-10-CM

## 2023-08-24 ENCOUNTER — Inpatient Hospital Stay

## 2023-08-24 ENCOUNTER — Other Ambulatory Visit: Payer: Self-pay

## 2023-08-24 ENCOUNTER — Encounter: Payer: Self-pay | Admitting: Hematology

## 2023-08-24 ENCOUNTER — Inpatient Hospital Stay: Attending: Hematology

## 2023-08-24 VITALS — BP 139/66 | HR 88 | Temp 98.2°F | Resp 16 | Wt 140.2 lb

## 2023-08-24 DIAGNOSIS — D693 Immune thrombocytopenic purpura: Secondary | ICD-10-CM | POA: Insufficient documentation

## 2023-08-24 DIAGNOSIS — G893 Neoplasm related pain (acute) (chronic): Secondary | ICD-10-CM | POA: Diagnosis not present

## 2023-08-24 DIAGNOSIS — Z79899 Other long term (current) drug therapy: Secondary | ICD-10-CM | POA: Diagnosis not present

## 2023-08-24 DIAGNOSIS — C8590 Non-Hodgkin lymphoma, unspecified, unspecified site: Secondary | ICD-10-CM | POA: Diagnosis present

## 2023-08-24 DIAGNOSIS — Z5111 Encounter for antineoplastic chemotherapy: Secondary | ICD-10-CM | POA: Diagnosis present

## 2023-08-24 DIAGNOSIS — Z5112 Encounter for antineoplastic immunotherapy: Secondary | ICD-10-CM | POA: Insufficient documentation

## 2023-08-24 DIAGNOSIS — Z79624 Long term (current) use of inhibitors of nucleotide synthesis: Secondary | ICD-10-CM | POA: Diagnosis not present

## 2023-08-24 DIAGNOSIS — C9110 Chronic lymphocytic leukemia of B-cell type not having achieved remission: Secondary | ICD-10-CM | POA: Diagnosis not present

## 2023-08-24 LAB — CMP (CANCER CENTER ONLY)
ALT: 11 U/L (ref 0–44)
AST: 17 U/L (ref 15–41)
Albumin: 3.8 g/dL (ref 3.5–5.0)
Alkaline Phosphatase: 72 U/L (ref 38–126)
Anion gap: 4 — ABNORMAL LOW (ref 5–15)
BUN: 24 mg/dL — ABNORMAL HIGH (ref 8–23)
CO2: 27 mmol/L (ref 22–32)
Calcium: 8.9 mg/dL (ref 8.9–10.3)
Chloride: 105 mmol/L (ref 98–111)
Creatinine: 0.89 mg/dL (ref 0.61–1.24)
GFR, Estimated: >60 mL/min (ref >60)
Glucose, Bld: 168 mg/dL — ABNORMAL HIGH (ref 70–99)
Potassium: 4.6 mmol/L (ref 3.5–5.1)
Sodium: 136 mmol/L (ref 135–145)
Total Bilirubin: 0.6 mg/dL (ref 0.0–1.2)
Total Protein: 7.3 g/dL (ref 6.5–8.1)

## 2023-08-24 LAB — CBC WITH DIFFERENTIAL (CANCER CENTER ONLY)
Abs Immature Granulocytes: 0.12 K/uL — ABNORMAL HIGH (ref 0.00–0.07)
Basophils Absolute: 0.1 K/uL (ref 0.0–0.1)
Basophils Relative: 1 %
Eosinophils Absolute: 0 K/uL (ref 0.0–0.5)
Eosinophils Relative: 0 %
HCT: 24.6 % — ABNORMAL LOW (ref 39.0–52.0)
Hemoglobin: 8.1 g/dL — ABNORMAL LOW (ref 13.0–17.0)
Immature Granulocytes: 1 %
Lymphocytes Relative: 73 %
Lymphs Abs: 11.6 K/uL — ABNORMAL HIGH (ref 0.7–4.0)
MCH: 31.5 pg (ref 26.0–34.0)
MCHC: 32.9 g/dL (ref 30.0–36.0)
MCV: 95.7 fL (ref 80.0–100.0)
Monocytes Absolute: 1.1 K/uL — ABNORMAL HIGH (ref 0.1–1.0)
Monocytes Relative: 7 %
Neutro Abs: 2.8 K/uL (ref 1.7–7.7)
Neutrophils Relative %: 18 %
Platelet Count: 67 K/uL — ABNORMAL LOW (ref 150–400)
RBC: 2.57 MIL/uL — ABNORMAL LOW (ref 4.22–5.81)
RDW: 16 % — ABNORMAL HIGH (ref 11.5–15.5)
Smear Review: NORMAL
WBC Count: 15.8 K/uL — ABNORMAL HIGH (ref 4.0–10.5)
nRBC: 0.6 % — ABNORMAL HIGH (ref 0.0–0.2)

## 2023-08-24 LAB — SAMPLE TO BLOOD BANK

## 2023-08-24 LAB — HEPATITIS B SURFACE ANTIGEN: Hepatitis B Surface Ag: NONREACTIVE

## 2023-08-24 MED ORDER — ROMIPLOSTIM 125 MCG ~~LOC~~ SOLR
1.0000 ug/kg | Freq: Once | SUBCUTANEOUS | Status: AC
  Administered 2023-08-24: 65 ug via SUBCUTANEOUS
  Filled 2023-08-24: qty 0.13

## 2023-08-25 LAB — HEPATITIS B CORE ANTIBODY, TOTAL: HEP B CORE AB: POSITIVE — AB

## 2023-08-25 NOTE — Progress Notes (Signed)
 Pharmacist Chemotherapy Monitoring - Initial Assessment    Anticipated start date: 09/01/23   The following has been reviewed per standard work regarding the patient's treatment regimen: The patient's diagnosis, treatment plan and drug doses, and organ/hematologic function Lab orders and baseline tests specific to treatment regimen  The treatment plan start date, drug sequencing, and pre-medications Prior authorization status  Patient's documented medication list, including drug-drug interaction screen and prescriptions for anti-emetics and supportive care specific to the treatment regimen The drug concentrations, fluid compatibility, administration routes, and timing of the medications to be used The patient's access for treatment and lifetime cumulative dose history, if applicable  The patient's medication allergies and previous infusion related reactions, if applicable   Changes made to treatment plan:  N/A  Follow up needed:  Hepatitis B core Ab = + (08/24/23; was previously neg).  F/U Dr. Maryla plan  F/U tol of RTX and switch to rapid if appropriate.  Note pt has had rapid (brand) Rituxan  in 2017.  No issues/rxn's documented.   Jeshurun Oaxaca, Pharm.D., CPP 08/25/2023@8 :43 AM

## 2023-08-27 ENCOUNTER — Other Ambulatory Visit: Payer: Self-pay

## 2023-08-27 ENCOUNTER — Encounter (HOSPITAL_COMMUNITY): Payer: Self-pay

## 2023-08-27 ENCOUNTER — Inpatient Hospital Stay (HOSPITAL_COMMUNITY)
Admission: EM | Admit: 2023-08-27 | Discharge: 2023-09-02 | DRG: 948 | Disposition: A | Discharge status: Home Health | Attending: Internal Medicine | Admitting: Internal Medicine

## 2023-08-27 ENCOUNTER — Emergency Department (HOSPITAL_COMMUNITY)

## 2023-08-27 DIAGNOSIS — G893 Neoplasm related pain (acute) (chronic): Secondary | ICD-10-CM | POA: Diagnosis not present

## 2023-08-27 DIAGNOSIS — E871 Hypo-osmolality and hyponatremia: Secondary | ICD-10-CM | POA: Insufficient documentation

## 2023-08-27 DIAGNOSIS — D693 Immune thrombocytopenic purpura: Secondary | ICD-10-CM | POA: Diagnosis present

## 2023-08-27 DIAGNOSIS — D649 Anemia, unspecified: Secondary | ICD-10-CM

## 2023-08-27 DIAGNOSIS — Z79899 Other long term (current) drug therapy: Secondary | ICD-10-CM

## 2023-08-27 DIAGNOSIS — E11319 Type 2 diabetes mellitus with unspecified diabetic retinopathy without macular edema: Secondary | ICD-10-CM | POA: Diagnosis present

## 2023-08-27 DIAGNOSIS — C9110 Chronic lymphocytic leukemia of B-cell type not having achieved remission: Secondary | ICD-10-CM | POA: Diagnosis present

## 2023-08-27 DIAGNOSIS — E785 Hyperlipidemia, unspecified: Secondary | ICD-10-CM | POA: Diagnosis present

## 2023-08-27 DIAGNOSIS — C8599 Non-Hodgkin lymphoma, unspecified, extranodal and solid organ sites: Secondary | ICD-10-CM | POA: Diagnosis present

## 2023-08-27 DIAGNOSIS — Z7984 Long term (current) use of oral hypoglycemic drugs: Secondary | ICD-10-CM

## 2023-08-27 DIAGNOSIS — N179 Acute kidney failure, unspecified: Secondary | ICD-10-CM | POA: Diagnosis not present

## 2023-08-27 DIAGNOSIS — C8590 Non-Hodgkin lymphoma, unspecified, unspecified site: Secondary | ICD-10-CM | POA: Diagnosis not present

## 2023-08-27 DIAGNOSIS — Z981 Arthrodesis status: Secondary | ICD-10-CM

## 2023-08-27 DIAGNOSIS — D696 Thrombocytopenia, unspecified: Secondary | ICD-10-CM

## 2023-08-27 DIAGNOSIS — D630 Anemia in neoplastic disease: Secondary | ICD-10-CM | POA: Diagnosis present

## 2023-08-27 DIAGNOSIS — R4589 Other symptoms and signs involving emotional state: Secondary | ICD-10-CM

## 2023-08-27 DIAGNOSIS — Z515 Encounter for palliative care: Secondary | ICD-10-CM

## 2023-08-27 DIAGNOSIS — I10 Essential (primary) hypertension: Secondary | ICD-10-CM | POA: Diagnosis present

## 2023-08-27 DIAGNOSIS — Z7189 Other specified counseling: Secondary | ICD-10-CM

## 2023-08-27 DIAGNOSIS — Z794 Long term (current) use of insulin: Secondary | ICD-10-CM

## 2023-08-27 DIAGNOSIS — K5900 Constipation, unspecified: Secondary | ICD-10-CM | POA: Diagnosis present

## 2023-08-27 LAB — TYPE AND SCREEN
ABO/RH(D): A POS
Antibody Screen: NEGATIVE

## 2023-08-27 LAB — CBC
HCT: 23.8 % — ABNORMAL LOW (ref 39.0–52.0)
Hemoglobin: 7.4 g/dL — ABNORMAL LOW (ref 13.0–17.0)
MCH: 30.6 pg (ref 26.0–34.0)
MCHC: 31.1 g/dL (ref 30.0–36.0)
MCV: 98.3 fL (ref 80.0–100.0)
Platelets: 62 K/uL — ABNORMAL LOW (ref 150–400)
RBC: 2.42 MIL/uL — ABNORMAL LOW (ref 4.22–5.81)
RDW: 16.2 % — ABNORMAL HIGH (ref 11.5–15.5)
WBC: 10.4 K/uL (ref 4.0–10.5)
nRBC: 0.3 % — ABNORMAL HIGH (ref 0.0–0.2)

## 2023-08-27 LAB — GLUCOSE, CAPILLARY: Glucose-Capillary: 95 mg/dL (ref 70–99)

## 2023-08-27 LAB — BASIC METABOLIC PANEL WITH GFR
Anion gap: 9 (ref 5–15)
BUN: 27 mg/dL — ABNORMAL HIGH (ref 8–23)
CO2: 27 mmol/L (ref 22–32)
Calcium: 9.4 mg/dL (ref 8.9–10.3)
Chloride: 96 mmol/L — ABNORMAL LOW (ref 98–111)
Creatinine, Ser: 0.97 mg/dL (ref 0.61–1.24)
GFR, Estimated: >60 mL/min (ref >60)
Glucose, Bld: 131 mg/dL — ABNORMAL HIGH (ref 70–99)
Potassium: 4.8 mmol/L (ref 3.5–5.1)
Sodium: 132 mmol/L — ABNORMAL LOW (ref 135–145)

## 2023-08-27 MED ORDER — HYDROMORPHONE HCL 1 MG/ML IJ SOLN
1.0000 mg | Freq: Once | INTRAMUSCULAR | Status: DC
  Administered 2023-08-27: 1 mg via INTRAVENOUS

## 2023-08-27 MED ORDER — ONDANSETRON HCL 4 MG PO TABS: 4.0000 mg | ORAL_TABLET | Freq: Four times a day (QID) | ORAL | Status: DC | PRN

## 2023-08-27 MED ORDER — ACETAMINOPHEN 650 MG RE SUPP: 650.0000 mg | Freq: Four times a day (QID) | RECTAL | Status: DC | PRN

## 2023-08-27 MED ORDER — ONDANSETRON HCL 4 MG/2ML IJ SOLN
4.0000 mg | Freq: Once | INTRAMUSCULAR | Status: DC | PRN
  Filled 2023-08-27: qty 2

## 2023-08-27 MED ORDER — METHOCARBAMOL 1000 MG/10ML IJ SOLN: 500.0000 mg | Freq: Four times a day (QID) | INTRAMUSCULAR | Status: DC | PRN

## 2023-08-27 MED ORDER — ACETAMINOPHEN 325 MG PO TABS: 650.0000 mg | ORAL_TABLET | Freq: Four times a day (QID) | ORAL | Status: DC | PRN

## 2023-08-27 MED ORDER — HYDROMORPHONE HCL 1 MG/ML IJ SOLN
0.5000 mg | INTRAMUSCULAR | Status: DC | PRN
  Administered 2023-08-28 (×2): 1 mg via INTRAVENOUS
  Filled 2023-08-27 (×3): qty 1

## 2023-08-27 MED ORDER — ENSURE PLUS HIGH PROTEIN PO LIQD
237.0000 mL | Freq: Two times a day (BID) | ORAL | Status: DC
  Administered 2023-08-28: 237 mL via ORAL

## 2023-08-27 MED ORDER — HYDROMORPHONE HCL 1 MG/ML IJ SOLN
1.0000 mg | Freq: Once | INTRAMUSCULAR | Status: AC
  Administered 2023-08-27: 1 mg via INTRAVENOUS
  Filled 2023-08-27: qty 1

## 2023-08-27 MED ORDER — ONDANSETRON HCL 4 MG/2ML IJ SOLN: 4.0000 mg | Freq: Four times a day (QID) | INTRAMUSCULAR | Status: DC | PRN

## 2023-08-27 MED ORDER — PREDNISONE 20 MG PO TABS
40.0000 mg | ORAL_TABLET | Freq: Once | ORAL | Status: AC
  Administered 2023-08-27: 40 mg via ORAL
  Filled 2023-08-27: qty 2

## 2023-08-27 MED ORDER — OXYCODONE HCL 5 MG PO TABS: 5.0000 mg | ORAL_TABLET | ORAL | Status: DC | PRN

## 2023-08-27 MED ORDER — EMPAGLIFLOZIN 10 MG PO TABS: 10.0000 mg | ORAL_TABLET | Freq: Every morning | ORAL | Status: DC

## 2023-08-27 MED ORDER — INSULIN ASPART 100 UNIT/ML IJ SOLN
0.0000 [IU] | Freq: Three times a day (TID) | INTRAMUSCULAR | Status: DC
  Administered 2023-08-28: 9 [IU] via SUBCUTANEOUS
  Administered 2023-08-28: 2 [IU] via SUBCUTANEOUS
  Administered 2023-08-28: 5 [IU] via SUBCUTANEOUS
  Administered 2023-08-29 (×3): 3 [IU] via SUBCUTANEOUS
  Administered 2023-08-30: 2 [IU] via SUBCUTANEOUS
  Administered 2023-08-30: 1 [IU] via SUBCUTANEOUS
  Administered 2023-08-30: 2 [IU] via SUBCUTANEOUS
  Administered 2023-08-31: 3 [IU] via SUBCUTANEOUS
  Administered 2023-08-31: 7 [IU] via SUBCUTANEOUS
  Administered 2023-08-31: 3 [IU] via SUBCUTANEOUS
  Administered 2023-09-01: 5 [IU] via SUBCUTANEOUS
  Administered 2023-09-01 – 2023-09-02 (×3): 3 [IU] via SUBCUTANEOUS
  Administered 2023-09-02: 2 [IU] via SUBCUTANEOUS
  Administered 2023-09-02: 5 [IU] via SUBCUTANEOUS
  Filled 2023-08-27: qty 0.09

## 2023-08-27 MED ORDER — AMLODIPINE BESYLATE 10 MG PO TABS
10.0000 mg | ORAL_TABLET | Freq: Every day | ORAL | Status: DC
  Administered 2023-08-28 – 2023-09-02 (×6): 10 mg via ORAL
  Filled 2023-08-27 (×6): qty 1

## 2023-08-27 MED ORDER — ACYCLOVIR 400 MG PO TABS
400.0000 mg | ORAL_TABLET | Freq: Every day | ORAL | Status: DC
  Administered 2023-08-28 – 2023-09-02 (×6): 400 mg via ORAL
  Filled 2023-08-27 (×6): qty 1

## 2023-08-27 MED ORDER — SIMVASTATIN 20 MG PO TABS
20.0000 mg | ORAL_TABLET | Freq: Every day | ORAL | Status: DC
  Administered 2023-08-28 – 2023-09-02 (×6): 20 mg via ORAL
  Filled 2023-08-27 (×6): qty 1

## 2023-08-27 MED ORDER — ALLOPURINOL 300 MG PO TABS
150.0000 mg | ORAL_TABLET | Freq: Every day | ORAL | Status: DC
  Administered 2023-08-28 – 2023-09-02 (×6): 150 mg via ORAL
  Filled 2023-08-27 (×6): qty 1

## 2023-08-27 NOTE — ED Triage Notes (Signed)
 Patient arrive reporting low back pain, right side radiating down R leg. Reports he has Chronic lymphocytic leukemia and meds at home prescribed, not working. Endorses here for pain management

## 2023-08-27 NOTE — Assessment & Plan Note (Deleted)
?   Malignancy related At baseline Monitor

## 2023-08-27 NOTE — Assessment & Plan Note (Addendum)
 Diagnosed CLL in 2012 status post several previous therapies  Follows with Dr. Onesimo Retroperitoneal lymph node biopsy on 6/30 supportive of low-grade lymphoma MRI of the lumbar spine performed on 6/27 in addition to repeat lumbar spine on 7/19 revealing diffuse marrow disease likely secondary to lymphoma

## 2023-08-27 NOTE — Assessment & Plan Note (Deleted)
 Hgb of 7.4 today Required 1 PRBC during recent hospital stay Type and screen and transfuse if hgb <7

## 2023-08-27 NOTE — Assessment & Plan Note (Deleted)
 Hgb of 7.4 today Required 1 PRBC during recent hospital stay Type and screen and transfuse if hgb <7  Platelets at baseline

## 2023-08-27 NOTE — ED Provider Notes (Signed)
 [facility] EMERGENCY DEPARTMENT AT [facility] Provider Note   CSN: [id number] Arrival date & time: 08/27/23  1603     Patient presents with: Back Pain and Leg Pain   Tyler Scott is a 68 y.o. male.    Back Pain Associated symptoms: leg pain   Leg Pain Associated symptoms: back pain    Patient has a history of hyperlipidemia hypertension CLL diabetes lymphoma.  Patient was recently admitted to the hospital on June 27.  As part of his workup they determined that he had a large mass in the spinal area concerning for lymphoma.  Physical therapy evaluated the patient.  They were hoping for skilled nursing facility rehab but insurance declined rehab and the patient was sent home with home health.  Patient states he has been taking his oxycodone  medication but is not controlling his pain.  He is having difficulty moving around because of the pain and discomfort.    Prior to Admission medications   Medication Sig Start Date End Date Taking? Authorizing Provider  acyclovir  (ZOVIRAX ) 400 MG tablet Take 1 tablet (400 mg total) by mouth daily. 08/16/23   Onesimo Emaline Brink, MD  allopurinol  (ZYLOPRIM ) 300 MG tablet Take 0.5 tablets (150 mg total) by mouth daily. 08/16/23   Onesimo Emaline Brink, MD  amLODipine  (NORVASC ) 10 MG tablet Take 1 tablet (10 mg total) by mouth daily for 10 days. 08/18/23 09/16/23  Vernon Ranks, MD  Blood Glucose Monitoring Suppl (ONE TOUCH ULTRA 2) W/DEVICE KIT See admin instructions. 07/25/14   [provider]  Cholecalciferol 50 MCG (2000 UT) TABS TAKE ONE TABLET BY MOUTH DAILY FOR LOW VITAMIN D. 04/29/20   [provider]  doxepin  (SINEQUAN ) 25 MG capsule Take 1 capsule by mouth at bedtime. 11/29/20   [provider]  empagliflozin (JARDIANCE) 25 MG TABS tablet Take 0.5 tablets by mouth every morning. Only if blood sugars get too high 05/30/21   [provider]  finasteride  (PROSCAR ) 5 MG tablet Take 0.5 tablets by mouth  daily. 05/01/20   [provider]  Insulin  Glargine (LANTUS  SOLOSTAR) 100 UNIT/ML Solostar Pen INJECT 20 UNITS SUBCUTANEOUSLY AT BEDTIME 06/30/16   Krystal Reyes LABOR, MD  ondansetron  (ZOFRAN ) 8 MG tablet Take 1 tablet (8 mg total) by mouth every 8 (eight) hours as needed for nausea or vomiting. Start on the third day after chemotherapy. 08/16/23   Onesimo Emaline Brink, MD  ONE [facility] ULTRA TEST test strip TEST GLUCOSE 3 TIMES A DAY 10/07/16   Krystal Reyes LABOR, MD  [facility] DELICA LANCETS 33G MISC TEST GLUCOSE 3 TIMES A DAY 10/07/16   Krystal Reyes LABOR, MD  oxyCODONE  (OXY IR/ROXICODONE ) 5 MG immediate release tablet Take 1 tablet (5 mg total) by mouth every 4 (four) hours as needed for moderate pain (pain score 4-6). 08/17/23   Vernon Ranks, MD  prochlorperazine  (COMPAZINE ) 10 MG tablet Take 1 tablet (10 mg total) by mouth every 6 (six) hours as needed for nausea or vomiting. 08/16/23   Onesimo Emaline Brink, MD  simvastatin  (ZOCOR ) 20 MG tablet TAKE 1 TABLET (20 MG TOTAL) BY MOUTH AT BEDTIME. 06/30/16   Krystal Reyes LABOR, MD    Allergies: Patient has no known allergies.    Review of Systems  Musculoskeletal:  Positive for back pain.    Updated Vital Signs BP (!) 126/58   Pulse 91   Temp 98.5 F (36.9 C) (Oral)   Resp 17   Ht 1.727 m (5' 8)  Wt 63.5 kg   SpO2 99%   BMI 21.29 kg/m   Physical Exam Vitals and nursing note reviewed.  Constitutional:      Appearance: He is well-developed. He is not diaphoretic.  HENT:     Head: Normocephalic and atraumatic.     Right Ear: External ear normal.     Left Ear: External ear normal.  Eyes:     General: No scleral icterus.       Right eye: No discharge.        Left eye: No discharge.     Conjunctiva/sclera: Conjunctivae normal.  Neck:     Trachea: No tracheal deviation.  Cardiovascular:     Rate and Rhythm: Normal rate and regular rhythm.  Pulmonary:     Effort: Pulmonary effort is normal. No respiratory distress.     Breath sounds: Normal  breath sounds. No stridor. No wheezing or rales.  Abdominal:     General: Bowel sounds are normal. There is no distension.     Palpations: Abdomen is soft.     Tenderness: There is no abdominal tenderness. There is no guarding or rebound.  Musculoskeletal:        General: Tenderness present. No deformity.     Cervical back: Neck supple.     Comments: Ttp pelvic region and lower lumbar spine  Skin:    General: Skin is warm and dry.     Findings: No rash.  Neurological:     General: No focal deficit present.     Mental Status: He is alert.     Cranial Nerves: No cranial nerve deficit, dysarthria or facial asymmetry.     Sensory: No sensory deficit.     Motor: No abnormal muscle tone or seizure activity.     Coordination: Coordination normal.     Comments: No focal numbness or weakness  Psychiatric:        Mood and Affect: Mood normal.     (all labs ordered are listed, but only abnormal results are displayed) Labs Reviewed  CBC - Abnormal; Notable for the following components:      Result Value   RBC 2.42 (*)    Hemoglobin 7.4 (*)    HCT 23.8 (*)    RDW 16.2 (*)    Platelets 62 (*)    nRBC 0.3 (*)    All other components within normal limits  BASIC METABOLIC PANEL WITH GFR - Abnormal; Notable for the following components:   Sodium 132 (*)    Chloride 96 (*)    Glucose, Bld 131 (*)    BUN 27 (*)    All other components within normal limits    EKG: None  Radiology: DG Pelvis 1-2 Views Result Date: 08/27/2023 CLINICAL DATA:  Low back pain EXAM: PELVIS - 1-2 VIEW COMPARISON:  None Available. FINDINGS: Right femur is incompletely included. Mild femoroacetabular degenerative change. Pubic symphysis and rami appear intact. No definitive fracture. IMPRESSION: Mild degenerative change. Note that the right femur/trochanter is incompletely included, follow-up dedicated right hip radiographs may be obtained if desired. Electronically Signed   By: Luke Bun M.D.   On: 08/27/2023  18:23   DG Lumbar Spine Complete Result Date: 08/27/2023 CLINICAL DATA:  Lower back pain radiating to the right side EXAM: LUMBAR SPINE - COMPLETE 5 VIEW COMPARISON:  CT abdomen and pelvis dated 08/06/2023 FINDINGS: There is no evidence of lumbar spine fracture. Alignment is normal. Degenerative changes of the lumbar spine, most pronounced at L4-S1, where there is  likely neural foraminal narrowing. IMPRESSION: Degenerative changes of the lumbar spine, most pronounced at L4-S1, where there is likely neural foraminal narrowing. Electronically Signed   By: Limin  Xu M.D.   On: 08/27/2023 18:23     Procedures   Medications Ordered in the ED  ondansetron  (ZOFRAN ) injection 4 mg (has no administration in time range)  HYDROmorphone  (DILAUDID ) injection 1 mg (has no administration in time range)  HYDROmorphone  (DILAUDID ) injection 1 mg (1 mg Intravenous Given 08/27/23 1724)    Clinical Course as of 08/27/23 2038  Fri Aug 27, 2023  1755 CBC(!) Hemoglobin decreased but similar compared to previous [JK]  1755 Basic metabolic panel(!) Metabolic panel normal [JK]  1940 No definitive fracture of the pelvis or lumbar spine. [JK]  2038 Case discussed with Dr Waddell [JK]    Clinical Course User Index [JK] Randol Simmonds, MD                                 Medical Decision Making Amount and/or Complexity of Data Reviewed Labs: ordered. Decision-making details documented in ED Course. Radiology: ordered.  Risk Prescription drug management.   Patient presented to the ED for evaluation of persistent back pain.  Patient was recently in the hospital.  He was diagnosed with lymphoma.  Patient had an MRI that showed masslike extra osseous paraspinal abnormality at multiple levels.  Findings were concerning for extraosseous extension of the tumor.  Patient states he is continuing to have persistent pain in his back.  Patient is not able to manage at home with his oral medications.  In the ED the patient was  given several doses of opiate pain medications.  No signs of acute infection.  He does not have any acute neurodeficits.  No signs of acute fracture on x-ray.  However with his persistent pain and malignancy I will consult the medical service for admission further treatment.     Final diagnoses:  Lymphoma, unspecified body region, unspecified lymphoma type [facility])    ED Discharge Orders     None          Randol Simmonds, MD 08/27/23 2006

## 2023-08-27 NOTE — Assessment & Plan Note (Addendum)
Continue norvasc 10 mg daily. 

## 2023-08-27 NOTE — Assessment & Plan Note (Addendum)
 Platelets at baseline  No VTE prophylaxis  Continue to trend

## 2023-08-27 NOTE — Assessment & Plan Note (Deleted)
-  Biopsy of RP LN 6/30, prelim path shows Low grade Lymphoma consistent with CLL/SLL.   -plans for palliative chemo, but has not started since discharged from hospital on 08/18/23.  -consult oncology tomorrow

## 2023-08-27 NOTE — Assessment & Plan Note (Addendum)
 68 year old male with known CLL>lymphoma who presented to Ed with complaints of intractable back pain despite his home oral pain medication.  -obs to med surg -just finished steroid taper early this week  -give prednisone  40mg  now  -muscle relaxer PRN  -Mri spine below. He can not move his RLL against gravity, but can not really tell me if this is new since hospital d/c. He has no urinary incontinence or saddle paraesthesias. Check MRI lumbar spine w/wo contrast.  -MRI lumbar spine 08/06/23: Masslike extraosseous paraspinal signal abnormality at multiple levels, and L4 and subtle, equivacal signal in the ventral canal in the lower lumbar canal and sacral canal could potentially represent extraosseous extension of tumor but is incompletely assessed without contrast. If the patient is able, consider postcontrast imaging. -needs adjustment in pain control.  -oxycodone  5mg  QID PRN moderate pain  -dilaudid  PRN -palliative care consult for pain management -oncology consult tomorrow if needed depending on MRI, has not started chemo for lymphoma

## 2023-08-27 NOTE — Assessment & Plan Note (Addendum)
Continue zocor 20 mg daily 

## 2023-08-27 NOTE — H&P (Signed)
 History and Physical    Patient: Tyler Scott FMW:[id number] DOB: 06-08-1955 DOA: 08/27/2023 DOS: the patient was seen and examined on 08/27/2023 PCP: [name], MD  Patient coming from: Home - lives with his wife. Has been using walker to ambulate.    Chief Complaint: back pain and leg pain   HPI: Tyler Scott is a 68 y.o. male with medical history significant of HTN, HLD, T2DM, CLL, chronic ITP who presented to ED with intractable back pain leg pain. He was given oral oxycodone  and it's not touching the pain. He states his back pain is throbbing. His weakness in his right leg is worse and is needing assistance for standing. This has not changed from hospital d/c. He has no urinary incontinence or saddle paraesthesias.    Admitted 08/06/23-08/18/23 for low back pain. Had retroperitoneal LN biopsy on 08/09/23. Pathology showed low grade lymphoma. Needed 1 unit PRBC during this stay. Chemotherapy regimen with Bendamustine /rituximab  orders placed to start as outpatient along with weekly Nplate    Denies any fever/chills, vision changes/headaches, chest pain or palpitations, shortness of breath or cough, abdominal pain, N/V/D, dysuria or leg swelling.   He does not smoke or drink alcohol.   ER Course:  vitals: afebrile, bp: 123/66, HR: 95, RR: 18, oxygen: 100%RA Pertinent labs: hgb: 7.4, platelets 62, sodium: 132, BUN: 27  Lumbar spine: degenerative changes of lumbar spine, most pronounced at L4-S1, where there is likely neural foraminal narrowing.  Pelvic xray: Mild degenerative change. Note that the right femur/trochanter is incompletely included, follow-up dedicated right hip radiographs may be obtained if desired. In ED: given zofran  and dilaudid . Trh asked to admit.    Review of Systems: As mentioned in the history of present illness. All other systems reviewed and are negative. Past Medical History:  Diagnosis Date   Allergy    Arthritis    CLL (chronic  lymphocytic leukemia) (HCC)    stage III: asymptomatic; on observation   DIABETES MELLITUS, TYPE I, UNCONTROLLED 12/30/2006   insulin    Diabetic retinopathy    ERECTILE DYSFUNCTION, ORGANIC 12/30/2006   Hx of adenomatous polyp of colon 05/24/2014   HYPERLIPIDEMIA 12/30/2006   HYPERTENSION 12/30/2006   LOW BACK PAIN, ACUTE 12/21/2006   Past Surgical History:  Procedure Laterality Date   COLONOSCOPY     great right toe- Fused the I P joint     Social History:  reports that he has never smoked. He has never used smokeless tobacco. He reports current alcohol use of about 4.0 standard drinks of alcohol per week. He reports that he does not use drugs.  No Known Allergies  Family History  Problem Relation Age of Onset   Alcohol abuse Other    Colon cancer Brother 82       Died 10/26/2013   Diabetes Mellitus II Mother    Esophageal cancer Neg Hx    Rectal cancer Neg Hx    Stomach cancer Neg Hx     Prior to Admission medications   Medication Sig Start Date End Date Taking? Authorizing Provider  acyclovir  (ZOVIRAX ) 400 MG tablet Take 1 tablet (400 mg total) by mouth daily. 08/16/23   Onesimo Emaline Brink, MD  allopurinol  (ZYLOPRIM ) 300 MG tablet Take 0.5 tablets (150 mg total) by mouth daily. 08/16/23   Onesimo Emaline Brink, MD  amLODipine  (NORVASC ) 10 MG tablet Take 1 tablet (10 mg total) by mouth daily for 10 days. 08/18/23 09/16/23  Vernon Ranks, MD  Blood Glucose Monitoring Suppl (ONE [facility]  ULTRA 2) W/DEVICE KIT See admin instructions. 07/25/14   [provider]  Cholecalciferol 50 MCG (2000 UT) TABS TAKE ONE TABLET BY MOUTH DAILY FOR LOW VITAMIN D. 04/29/20   [provider]  doxepin  (SINEQUAN ) 25 MG capsule Take 1 capsule by mouth at bedtime. 11/29/20   [provider]  empagliflozin (JARDIANCE) 25 MG TABS tablet Take 0.5 tablets by mouth every morning. Only if blood sugars get too high 05/30/21   [provider]  finasteride  (PROSCAR ) 5 MG tablet Take 0.5  tablets by mouth daily. 05/01/20   [provider]  Insulin  Glargine (LANTUS  SOLOSTAR) 100 UNIT/ML Solostar Pen INJECT 20 UNITS SUBCUTANEOUSLY AT BEDTIME 06/30/16   Krystal Reyes LABOR, MD  ondansetron  (ZOFRAN ) 8 MG tablet Take 1 tablet (8 mg total) by mouth every 8 (eight) hours as needed for nausea or vomiting. Start on the third day after chemotherapy. 08/16/23   Onesimo Emaline Brink, MD  ONE [facility] ULTRA TEST test strip TEST GLUCOSE 3 TIMES A DAY 10/07/16   Krystal Reyes LABOR, MD  [facility] DELICA LANCETS 33G MISC TEST GLUCOSE 3 TIMES A DAY 10/07/16   Krystal Reyes LABOR, MD  oxyCODONE  (OXY IR/ROXICODONE ) 5 MG immediate release tablet Take 1 tablet (5 mg total) by mouth every 4 (four) hours as needed for moderate pain (pain score 4-6). 08/17/23   Vernon Ranks, MD  prochlorperazine  (COMPAZINE ) 10 MG tablet Take 1 tablet (10 mg total) by mouth every 6 (six) hours as needed for nausea or vomiting. 08/16/23   Onesimo Emaline Brink, MD  simvastatin  (ZOCOR ) 20 MG tablet TAKE 1 TABLET (20 MG TOTAL) BY MOUTH AT BEDTIME. 06/30/16   Krystal Reyes LABOR, MD    Physical Exam: Vitals:   08/27/23 1609 08/27/23 1730 08/27/23 2142  BP: 123/66 (!) 126/58 (!) 152/77  Pulse: 95 91   Resp: 18 17 18   Temp: 98.5 F (36.9 C)  98.9 F (37.2 C)  TempSrc: Oral  Oral  SpO2: 100% 99% 100%  Weight: 63.5 kg  62.7 kg  Height: 5' 8 (1.727 m)     General:  Appears calm and comfortable and is in NAD Eyes:  PERRL, EOMI, normal lids, iris ENT:  grossly normal hearing, lips & tongue, mmm; appropriate dentition Neck:  no LAD, masses or thyromegaly; no carotid bruits Cardiovascular:  RRR, no m/r/g. No LE edema.  Respiratory:   CTA bilaterally with no wheezes/rales/rhonchi.  Normal respiratory effort. Abdomen:  soft, NT, ND, NABS Back:   normal alignment, no CVAT Skin:  no rash or induration seen on limited exam Musculoskeletal:  grossly normal tone BUE and LLE. RLE: can not lift against gravity. Sensation intact.  Lower extremity:  No  LE edema.  Limited foot exam with no ulcerations.  2+ distal pulses. Psychiatric:  grossly normal mood and affect, speech fluent and appropriate, AOx3 Neurologic:  CN 2-12 grossly intact, moves all extremities except RLL in coordinated fashion, sensation intact   Radiological Exams on Admission: Independently reviewed - see discussion in A/P where applicable  DG Pelvis 1-2 Views Result Date: 08/27/2023 CLINICAL DATA:  Low back pain EXAM: PELVIS - 1-2 VIEW COMPARISON:  None Available. FINDINGS: Right femur is incompletely included. Mild femoroacetabular degenerative change. Pubic symphysis and rami appear intact. No definitive fracture. IMPRESSION: Mild degenerative change. Note that the right femur/trochanter is incompletely included, follow-up dedicated right hip radiographs may be obtained if desired. Electronically Signed   By: Luke Bun M.D.   On: 08/27/2023 18:23   DG Lumbar  Spine Complete Result Date: 08/27/2023 CLINICAL DATA:  Lower back pain radiating to the right side EXAM: LUMBAR SPINE - COMPLETE 5 VIEW COMPARISON:  CT abdomen and pelvis dated 08/06/2023 FINDINGS: There is no evidence of lumbar spine fracture. Alignment is normal. Degenerative changes of the lumbar spine, most pronounced at L4-S1, where there is likely neural foraminal narrowing. IMPRESSION: Degenerative changes of the lumbar spine, most pronounced at L4-S1, where there is likely neural foraminal narrowing. Electronically Signed   By: Limin  Xu M.D.   On: 08/27/2023 18:23     Labs on Admission: I have personally reviewed the available labs and imaging studies at the time of the admission.  Pertinent labs:   hgb: 7.4,  platelets 62,  sodium: 132,  BUN: 27   Assessment and Plan: Principal Problem:   Cancer-related breakthrough pain Active Problems:   Lymphoma, low grade (HCC)   CLL (chronic lymphocytic leukemia) (HCC)   Normocytic anemia   Essential hypertension   Controlled type 2 diabetes mellitus with  retinopathy, with long-term current use of insulin  (HCC)   Hyperlipidemia   Chronic ITP (idiopathic thrombocytopenia) (HCC)   Hyponatremia    Assessment and Plan: * Cancer-related breakthrough pain 68 year old male with known CLL>lymphoma who presented to Ed with complaints of intractable back pain despite his home oral pain medication.  -obs to med surg -just finished steroid taper early this week  -give prednisone  40mg  now  -muscle relaxer PRN  -Mri spine below. He can not move his RLL against gravity, but can not really tell me if this is new since hospital d/c. He has no urinary incontinence or saddle paraesthesias. Check MRI lumbar spine w/wo contrast.  -MRI lumbar spine 08/06/23: Masslike extraosseous paraspinal signal abnormality at multiple levels, and L4 and subtle, equivacal signal in the ventral canal in the lower lumbar canal and sacral canal could potentially represent extraosseous extension of tumor but is incompletely assessed without contrast. If the patient is able, consider postcontrast imaging. -needs adjustment in pain control.  -oxycodone  5mg  QID PRN moderate pain  -dilaudid  PRN -palliative care consult for pain management -oncology consult tomorrow if needed depending on MRI, has not started chemo for lymphoma   Lymphoma, low grade (HCC)  -Biopsy of RP LN 6/30, prelim path shows Low grade Lymphoma consistent with CLL/SLL.   -plans for palliative chemo, but has not started since discharged from hospital on 08/18/23.  -consult oncology tomorrow   CLL (chronic lymphocytic leukemia) (HCC) -- Diagnosed CLL in 2012 -- status post several previous therapies including Bendamustine  and rituximab , Ibrutinib , and most recently on Calquence  since 2023.  -- Patient was supposed to be on Gazyva , venetoclax . He had many interruped cycles of Gazyva  due to No shows.  He was last seen in outpatient oncology on 07/06/23 at which time he was scheduled for C4D1 Gazyva  however was HELD due  to infection/acute left sided epidydimo-orchitis for which he was on antibiotics.   Normocytic anemia Hgb of 7.4 today Required 1 PRBC during recent hospital stay Type and screen and transfuse if hgb <7   Essential hypertension Continue norvasc  10mg  daily   Controlled type 2 diabetes mellitus with retinopathy, with long-term current use of insulin  (HCC) A1C of 5.8 in June of 2025  Continue long acting insulin   SSI and accuchecks QAC/HS   Hyperlipidemia Continue zocor  20mg  daily   Chronic ITP (idiopathic thrombocytopenia) (HCC) Platelets at baseline  No VTE prophylaxis  Continue to trend   Hyponatremia ? Malignancy related At baseline  Monitor     Advance Care Planning:   Code Status: Full Code   Consults: palliative care   DVT Prophylaxis: SCDs   Family Communication: none   Severity of Illness: The appropriate patient status for this patient is OBSERVATION. Observation status is judged to be reasonable and necessary in order to provide the required intensity of service to ensure the patient's safety. The patient's presenting symptoms, physical exam findings, and initial radiographic and laboratory data in the context of their medical condition is felt to place them at decreased risk for further clinical deterioration. Furthermore, it is anticipated that the patient will be medically stable for discharge from the hospital within 2 midnights of admission.   Author: Isaiah Geralds, MD 08/27/2023 10:27 PM  For on call review [email or web address].

## 2023-08-27 NOTE — Assessment & Plan Note (Addendum)
 A1C of 5.8 in June of 2025  Continue long acting insulin   SSI and accuchecks QAC/HS

## 2023-08-28 ENCOUNTER — Observation Stay (HOSPITAL_COMMUNITY)

## 2023-08-28 ENCOUNTER — Encounter (HOSPITAL_COMMUNITY): Payer: Self-pay

## 2023-08-28 DIAGNOSIS — D649 Anemia, unspecified: Secondary | ICD-10-CM | POA: Diagnosis not present

## 2023-08-28 DIAGNOSIS — Z794 Long term (current) use of insulin: Secondary | ICD-10-CM | POA: Diagnosis not present

## 2023-08-28 DIAGNOSIS — C8599 Non-Hodgkin lymphoma, unspecified, extranodal and solid organ sites: Secondary | ICD-10-CM | POA: Diagnosis present

## 2023-08-28 DIAGNOSIS — Z7189 Other specified counseling: Secondary | ICD-10-CM

## 2023-08-28 DIAGNOSIS — G893 Neoplasm related pain (acute) (chronic): Secondary | ICD-10-CM | POA: Diagnosis present

## 2023-08-28 DIAGNOSIS — C8590 Non-Hodgkin lymphoma, unspecified, unspecified site: Secondary | ICD-10-CM | POA: Diagnosis present

## 2023-08-28 DIAGNOSIS — N179 Acute kidney failure, unspecified: Secondary | ICD-10-CM | POA: Diagnosis not present

## 2023-08-28 DIAGNOSIS — C9110 Chronic lymphocytic leukemia of B-cell type not having achieved remission: Secondary | ICD-10-CM | POA: Diagnosis present

## 2023-08-28 DIAGNOSIS — D696 Thrombocytopenia, unspecified: Secondary | ICD-10-CM | POA: Diagnosis not present

## 2023-08-28 DIAGNOSIS — Z7984 Long term (current) use of oral hypoglycemic drugs: Secondary | ICD-10-CM | POA: Diagnosis not present

## 2023-08-28 DIAGNOSIS — K5900 Constipation, unspecified: Secondary | ICD-10-CM | POA: Diagnosis present

## 2023-08-28 DIAGNOSIS — E871 Hypo-osmolality and hyponatremia: Secondary | ICD-10-CM | POA: Diagnosis present

## 2023-08-28 DIAGNOSIS — Z981 Arthrodesis status: Secondary | ICD-10-CM | POA: Diagnosis not present

## 2023-08-28 DIAGNOSIS — E11319 Type 2 diabetes mellitus with unspecified diabetic retinopathy without macular edema: Secondary | ICD-10-CM | POA: Diagnosis present

## 2023-08-28 DIAGNOSIS — E785 Hyperlipidemia, unspecified: Secondary | ICD-10-CM | POA: Diagnosis present

## 2023-08-28 DIAGNOSIS — R4589 Other symptoms and signs involving emotional state: Secondary | ICD-10-CM | POA: Diagnosis not present

## 2023-08-28 DIAGNOSIS — Z515 Encounter for palliative care: Secondary | ICD-10-CM | POA: Diagnosis not present

## 2023-08-28 DIAGNOSIS — D693 Immune thrombocytopenic purpura: Secondary | ICD-10-CM | POA: Diagnosis present

## 2023-08-28 DIAGNOSIS — Z79899 Other long term (current) drug therapy: Secondary | ICD-10-CM

## 2023-08-28 DIAGNOSIS — D630 Anemia in neoplastic disease: Secondary | ICD-10-CM | POA: Diagnosis present

## 2023-08-28 DIAGNOSIS — I10 Essential (primary) hypertension: Secondary | ICD-10-CM | POA: Diagnosis present

## 2023-08-28 LAB — BASIC METABOLIC PANEL WITH GFR
Anion gap: 10 (ref 5–15)
Anion gap: 13 (ref 5–15)
Anion gap: 15 (ref 5–15)
BUN: 24 mg/dL — ABNORMAL HIGH (ref 8–23)
BUN: 24 mg/dL — ABNORMAL HIGH (ref 8–23)
BUN: 35 mg/dL — ABNORMAL HIGH (ref 8–23)
CO2: 25 mmol/L (ref 22–32)
CO2: 22 mmol/L (ref 22–32)
CO2: 21 mmol/L — ABNORMAL LOW (ref 22–32)
Calcium: 9.4 mg/dL (ref 8.9–10.3)
Calcium: 9.4 mg/dL (ref 8.9–10.3)
Calcium: 9.3 mg/dL (ref 8.9–10.3)
Chloride: 93 mmol/L — ABNORMAL LOW (ref 98–111)
Chloride: 93 mmol/L — ABNORMAL LOW (ref 98–111)
Chloride: 93 mmol/L — ABNORMAL LOW (ref 98–111)
Creatinine, Ser: 1 mg/dL (ref 0.61–1.24)
Creatinine, Ser: 0.96 mg/dL (ref 0.61–1.24)
Creatinine, Ser: 1.14 mg/dL (ref 0.61–1.24)
GFR, Estimated: >60 mL/min (ref >60)
GFR, Estimated: >60 mL/min (ref >60)
GFR, Estimated: >60 mL/min (ref >60)
Glucose, Bld: 154 mg/dL — ABNORMAL HIGH (ref 70–99)
Glucose, Bld: 258 mg/dL — ABNORMAL HIGH (ref 70–99)
Glucose, Bld: 432 mg/dL — ABNORMAL HIGH (ref 70–99)
Potassium: 5.9 mmol/L — ABNORMAL HIGH (ref 3.5–5.1)
Potassium: 5.5 mmol/L — ABNORMAL HIGH (ref 3.5–5.1)
Potassium: 5 mmol/L (ref 3.5–5.1)
Sodium: 128 mmol/L — ABNORMAL LOW (ref 135–145)
Sodium: 128 mmol/L — ABNORMAL LOW (ref 135–145)
Sodium: 129 mmol/L — ABNORMAL LOW (ref 135–145)

## 2023-08-28 LAB — COMPREHENSIVE METABOLIC PANEL WITH GFR
ALT: 18 U/L (ref 0–44)
AST: 32 U/L (ref 15–41)
Albumin: 3.6 g/dL (ref 3.5–5.0)
Alkaline Phosphatase: 117 U/L (ref 38–126)
Anion gap: 15 (ref 5–15)
BUN: 36 mg/dL — ABNORMAL HIGH (ref 8–23)
CO2: 23 mmol/L (ref 22–32)
Calcium: 9.5 mg/dL (ref 8.9–10.3)
Chloride: 94 mmol/L — ABNORMAL LOW (ref 98–111)
Creatinine, Ser: 1.29 mg/dL — ABNORMAL HIGH (ref 0.61–1.24)
GFR, Estimated: >60 mL/min (ref >60)
Glucose, Bld: 234 mg/dL — ABNORMAL HIGH (ref 70–99)
Potassium: 4.5 mmol/L (ref 3.5–5.1)
Sodium: 132 mmol/L — ABNORMAL LOW (ref 135–145)
Total Bilirubin: 0.8 mg/dL (ref 0.0–1.2)
Total Protein: 7.8 g/dL (ref 6.5–8.1)

## 2023-08-28 LAB — CBC
HCT: 25.7 % — ABNORMAL LOW (ref 39.0–52.0)
Hemoglobin: 8.3 g/dL — ABNORMAL LOW (ref 13.0–17.0)
MCH: 31.6 pg (ref 26.0–34.0)
MCHC: 32.3 g/dL (ref 30.0–36.0)
MCV: 97.7 fL (ref 80.0–100.0)
Platelets: 75 K/uL — ABNORMAL LOW (ref 150–400)
RBC: 2.63 MIL/uL — ABNORMAL LOW (ref 4.22–5.81)
RDW: 15.9 % — ABNORMAL HIGH (ref 11.5–15.5)
WBC: 17.2 K/uL — ABNORMAL HIGH (ref 4.0–10.5)
nRBC: 0.2 % (ref 0.0–0.2)

## 2023-08-28 LAB — GLUCOSE, CAPILLARY
Glucose-Capillary: 151 mg/dL — ABNORMAL HIGH (ref 70–99)
Glucose-Capillary: 394 mg/dL — ABNORMAL HIGH (ref 70–99)
Glucose-Capillary: 425 mg/dL — ABNORMAL HIGH (ref 70–99)
Glucose-Capillary: 277 mg/dL — ABNORMAL HIGH (ref 70–99)

## 2023-08-28 LAB — CBC WITH DIFFERENTIAL/PLATELET
Abs Immature Granulocytes: 0.13 K/uL — ABNORMAL HIGH (ref 0.00–0.07)
Basophils Absolute: 0.1 K/uL (ref 0.0–0.1)
Basophils Relative: 1 %
Eosinophils Absolute: 0 K/uL (ref 0.0–0.5)
Eosinophils Relative: 0 %
HCT: 28.1 % — ABNORMAL LOW (ref 39.0–52.0)
Hemoglobin: 8.8 g/dL — ABNORMAL LOW (ref 13.0–17.0)
Immature Granulocytes: 1 %
Lymphocytes Relative: 77 %
Lymphs Abs: 17 K/uL — ABNORMAL HIGH (ref 0.7–4.0)
MCH: 30.8 pg (ref 26.0–34.0)
MCHC: 31.3 g/dL (ref 30.0–36.0)
MCV: 98.3 fL (ref 80.0–100.0)
Monocytes Absolute: 1.2 K/uL — ABNORMAL HIGH (ref 0.1–1.0)
Monocytes Relative: 5 %
Neutro Abs: 3.6 K/uL (ref 1.7–7.7)
Neutrophils Relative %: 16 %
Platelets: 82 K/uL — ABNORMAL LOW (ref 150–400)
RBC: 2.86 MIL/uL — ABNORMAL LOW (ref 4.22–5.81)
RDW: 15.8 % — ABNORMAL HIGH (ref 11.5–15.5)
WBC: 22.1 K/uL — ABNORMAL HIGH (ref 4.0–10.5)
nRBC: 0.1 % (ref 0.0–0.2)

## 2023-08-28 MED ORDER — POLYETHYLENE GLYCOL 3350 17 G PO PACK
17.0000 g | PACK | Freq: Every day | ORAL | Status: DC
  Administered 2023-08-28 – 2023-09-02 (×6): 17 g via ORAL
  Filled 2023-08-28 (×6): qty 1

## 2023-08-28 MED ORDER — GABAPENTIN 100 MG PO CAPS
100.0000 mg | ORAL_CAPSULE | Freq: Every day | ORAL | Status: DC
  Administered 2023-08-28: 100 mg via ORAL
  Filled 2023-08-28: qty 1

## 2023-08-28 MED ORDER — GADOBUTROL 1 MMOL/ML IV SOLN
6.0000 mL | Freq: Once | INTRAVENOUS | Status: AC | PRN
  Administered 2023-08-28: 6 mL via INTRAVENOUS

## 2023-08-28 MED ORDER — OXYCODONE HCL 5 MG PO TABS
10.0000 mg | ORAL_TABLET | ORAL | Status: DC | PRN
  Administered 2023-08-28 – 2023-08-29 (×4): 10 mg via ORAL
  Filled 2023-08-28 (×5): qty 2

## 2023-08-28 MED ORDER — SENNA 8.6 MG PO TABS
1.0000 | ORAL_TABLET | Freq: Two times a day (BID) | ORAL | Status: DC
  Administered 2023-08-28 – 2023-09-02 (×10): 8.6 mg via ORAL
  Filled 2023-08-28 (×11): qty 1

## 2023-08-28 MED ORDER — INSULIN ASPART 100 UNIT/ML IJ SOLN
3.0000 [IU] | Freq: Once | INTRAMUSCULAR | Status: AC
  Administered 2023-08-28: 3 [IU] via SUBCUTANEOUS

## 2023-08-28 MED ORDER — HYDROMORPHONE HCL 1 MG/ML IJ SOLN
1.0000 mg | INTRAMUSCULAR | Status: DC | PRN
  Administered 2023-08-28 – 2023-08-29 (×2): 1 mg via INTRAVENOUS
  Filled 2023-08-28 (×2): qty 1

## 2023-08-28 NOTE — Hospital Course (Addendum)
 DONTAY HARM is a 68 y.o. male with medical history significant of HTN, HLD, T2DM, known prior history of CLL, chronic ITP who presented to ED with intractable back pain leg pain.  Of note, patient was admitted 08/06/23-08/18/23 for low back pain. Had retroperitoneal LN biopsy on 08/09/23. Pathology showed low grade lymphoma. Needed 1 unit PRBC during this stay. Chemotherapy regimen with Bendamustine /rituximab  orders placed to start as outpatient along with weekly Nplate .  He had MRI of the lumbar spine done on 08/06/2023 which showed diffuse marrow disease likely due to lymphoma and repeat MRI on 08/28/2023 showed similar finding but slightly worsening.    Palliative care was consulted to facilitate goals of care discussion and to review pain medication regimen.  They recommended palliative care referral at time of discharge.  Dr. Onesimo was additionally consulted and felt that the best way for the patient to achieve pain control is to proceed to chemotherapy as soon as possible.  Arrangements were made for outpatient chemotherapy the following week.  Throughout the hospitalization, adequate pain control was achieved with titration of opiate-based analgesics.  Been consulted and is following.  Patient is been titrated on a regimen of both oral and intravenous opiate-based analgesics.  Physical therapy additionally had evaluated the patient and recommend that the patient would benefit from skilled physical therapy services via home health therapy at time of discharge.  Patient was discharged home in improved and stable condition on 7/24 with his new prescription of opiate-based analgesics with home health physical therapy and palliative care referrals.

## 2023-08-28 NOTE — Consult Note (Signed)
 Consultation Note Date: 08/28/2023   Patient Name: Tyler Scott  DOB: 1955-06-30  MRN: [id number]  Age / Sex: 68 y.o., male   PCP: [name], MD Referring Physician: Roann Gouty, MD  Reason for Consultation: Pain control     Chief Complaint/History of Present Illness:   Patient is a 68 year old male with a past medical history of hypertension, hyperlipidemia, type 2 diabetes mellitus, chronic ITP, and chronic CLL with recent diagnosis of low-grade lymphoma who was admitted on 08/27/2023 for management of worsening pain in his back and leg.  Patient was recently hospitalized from 08/06/2023 - 08/18/2023 for low back pain which is when biopsy was performed showing low grade lymphoma.  Upon admission, patient receiving management for cancer related pain.  Patient undergoing further MRI imaging due to worsening weakness in right lower extremity.  Oncology consulted for recommendations.  Palliative medicine consulted for pain management.  At time of EMR review in past 24 hours patient has received as needed IV Dilaudid  1 mg x 5 doses.  Reviewed recent BMP noting sodium 129, BUN 35, creatinine 1.14, and GFR estimated over 60.  Review of patient's CBC noting WBC 22.1 (patient has recently been on steroids), hemoglobin 8.8, and platelets 82.  Presented to bedside to see patient.  Initially patient present receiving MRI so presented to bedside later on after imaging.  When presenting to bedside, patient able to introduce his wife present at bedside as well.  Introduced myself as a member of the palliative medicine team and my role in patient's medical journey to assist with symptom management at this time.  Spent time learning about patient's pain.  Patient describing throbbing pain in his right buttocks that radiates down to his right thigh.  Patient notes that this pain can occur at any time though is particularly worsened by him trying to stand.  Patient noted that he has been having to  shift his weight to his left side to attempt to stand at all.  In addition to throbbing sensation, patient describes a shocking electric sensation that goes down his right leg.  Patient notes that these pains can occur on his left side as well at times.  Patient notes that the as needed IV Dilaudid  has assisted with pain management.  Patient denies any adverse effects from this medication such as confusion or lethargy.  Patient feels that the as needed oxycodone  5 mg dose is not touching his pain at all when taken.  Discussed at this time increasing his as needed oxycodone  to 10 mg every 4 hours as needed.  Can further adjust if needed.  Encouraged that IV Dilaudid  should be for breakthrough pain after as needed oxycodone .  Discussed if patient needing frequent dosing of short acting medication, can potentially start on long-acting pain medication.  Also discussed starting low-dose gabapentin  at night to assist with neuropathic pain component.  Patient and wife agreeing with this plan.  Inquired about patient's bowel movements.  Patient noting that since he has gotten sick he has only had a bowel movement once every week.  Discussed importance of managing constipation while taking opioids.  Noted would start patient on MiraLAX  and senna to assist with management.  These medications can be adjusted if needed.  Spent time answering questions as able.  Noted palliative medicine team continue to follow along with patient's medical journey.  Discussed care with hospitalist and RN to update regarding care.  Primary Diagnoses  Present on Admission:  Chronic ITP (idiopathic thrombocytopenia) (HCC)  CLL (chronic lymphocytic leukemia) (HCC)  Essential hypertension  Hyperlipidemia  Cancer-related breakthrough pain   Palliative Review of Systems: Right lower extremity pain in back radiating to right thigh  Past Medical History:  Diagnosis Date   Allergy    Arthritis    CLL (chronic lymphocytic leukemia)  (HCC)    stage III: asymptomatic; on observation   DIABETES MELLITUS, TYPE I, UNCONTROLLED 12/30/2006   insulin    Diabetic retinopathy    ERECTILE DYSFUNCTION, ORGANIC 12/30/2006   Hx of adenomatous polyp of colon 05/24/2014   HYPERLIPIDEMIA 12/30/2006   HYPERTENSION 12/30/2006   LOW BACK PAIN, ACUTE 12/21/2006   Social History   Socioeconomic History   Marital status: Married    Spouse name: Not on file   Number of children: Not on file   Years of education: Not on file   Highest education level: Not on file  Occupational History   Not on file  Tobacco Use   Smoking status: Never   Smokeless tobacco: Never  Vaping Use   Vaping status: Never Used  Substance and Sexual Activity   Alcohol use: Yes    Alcohol/week: 4.0 standard drinks of alcohol    Types: 4 Glasses of wine per week   Drug use: No   Sexual activity: Not on file    Comment: regular exercise - yes  Other Topics Concern   Not on file  Social History Narrative   Not on file   Social Drivers of Health   Financial Resource Strain: Not on file  Food Insecurity: No Food Insecurity (08/27/2023)   Hunger Vital Sign    Worried About Running Out of Food in the Last Year: Never true    Ran Out of Food in the Last Year: Never true  Transportation Needs: No Transportation Needs (08/27/2023)   PRAPARE - Administrator, Civil Service (Medical): No    Lack of Transportation (Non-Medical): No  Physical Activity: Not on file  Stress: Not on file  Social Connections: Moderately Isolated (08/27/2023)   Social Connection and Isolation Panel    Frequency of Communication with Friends and Family: More than three times a week    Frequency of Social Gatherings with Friends and Family: Once a week    Attends Religious Services: Never    Database administrator or Organizations: No    Attends Engineer, structural: Never    Marital Status: Married   Family History  Problem Relation Age of Onset   Alcohol  abuse Other    Colon cancer Brother 44       Died 11-06-13   Diabetes Mellitus II Mother    Esophageal cancer Neg Hx    Rectal cancer Neg Hx    Stomach cancer Neg Hx    Scheduled Meds:  acyclovir   400 mg Oral Daily   allopurinol   150 mg Oral Daily   amLODipine   10 mg Oral Daily   empagliflozin   10 mg Oral q morning   feeding supplement  237 mL Oral BID BM   insulin  aspart  0-9 Units Subcutaneous TID WC   simvastatin   20 mg Oral q1800   Continuous Infusions: PRN Meds:.acetaminophen  **OR** acetaminophen , HYDROmorphone  (DILAUDID ) injection, methocarbamol  (ROBAXIN ) injection, ondansetron  **OR** ondansetron  (ZOFRAN ) IV, oxyCODONE  No Known Allergies CBC:    Component Value Date/Time   WBC 17.2 (H) 08/28/2023 0641   HGB 8.3 (L) 08/28/2023 0641   HGB 8.1 (L) 08/24/2023 1329   HGB 12.8 (L) 01/01/2017 0809  HCT 25.7 (L) 08/28/2023 0641   HCT 38.5 01/01/2017 0809   PLT 75 (L) 08/28/2023 0641   PLT 67 (L) 08/24/2023 1329   PLT 224 01/01/2017 0809   MCV 97.7 08/28/2023 0641   MCV 94.7 01/01/2017 0809   NEUTROABS 2.8 08/24/2023 1329   NEUTROABS 4.5 01/01/2017 0809   LYMPHSABS 11.6 (H) 08/24/2023 1329   LYMPHSABS 32.4 (H) 01/01/2017 0809   MONOABS 1.1 (H) 08/24/2023 1329   MONOABS 0.9 01/01/2017 0809   EOSABS 0.0 08/24/2023 1329   EOSABS 0.4 01/01/2017 0809   BASOSABS 0.1 08/24/2023 1329   BASOSABS 0.0 01/01/2017 0809   Comprehensive Metabolic Panel:    Component Value Date/Time   NA 128 (L) 08/28/2023 0641   NA 142 01/01/2017 0809   K 5.9 (H) 08/28/2023 0641   K 4.4 01/01/2017 0809   CL 93 (L) 08/28/2023 0641   CL 102 04/25/2012 1003   CO2 25 08/28/2023 0641   CO2 26 01/01/2017 0809   BUN 24 (H) 08/28/2023 0641   BUN 15.7 01/01/2017 0809   CREATININE 1.00 08/28/2023 0641   CREATININE 0.89 08/24/2023 1329   CREATININE 0.9 01/01/2017 0809   GLUCOSE 154 (H) 08/28/2023 0641   GLUCOSE 89 01/01/2017 0809   GLUCOSE 170 (H) 04/25/2012 1003   CALCIUM 9.4 08/28/2023 0641    CALCIUM 9.1 01/01/2017 0809   AST 17 08/24/2023 1329   AST 22 01/01/2017 0809   ALT 11 08/24/2023 1329   ALT 19 01/01/2017 0809   ALKPHOS 72 08/24/2023 1329   ALKPHOS 68 01/01/2017 0809   BILITOT 0.6 08/24/2023 1329   BILITOT 0.75 01/01/2017 0809   PROT 7.3 08/24/2023 1329   PROT 7.0 01/01/2017 0809   ALBUMIN 3.8 08/24/2023 1329   ALBUMIN 4.2 01/01/2017 0809    Physical Exam: Vital Signs: BP (!) 148/74 (BP Location: Right Arm)   Pulse (!) 101   Temp 98.4 F (36.9 C) (Oral)   Resp 16   Ht 5' 8 (1.727 m)   Wt 62.7 kg   SpO2 98%   BMI 21.02 kg/m  SpO2: SpO2: 98 % O2 Device: O2 Device: Room Air O2 Flow Rate:   Intake/output summary:  Intake/Output Summary (Last 24 hours) at 08/28/2023 0830 Last data filed at 08/28/2023 9460 Gross per 24 hour  Intake --  Output 750 ml  Net -750 ml   LBM: Last BM Date : 08/26/23 Baseline Weight: Weight: 63.5 kg Most recent weight: Weight: 62.7 kg  General: NAD, alert, pleasant Cardiovascular: RRR Respiratory: no increased work of breathing noted, not in respiratory distress Neuro: A&Ox4, following commands easily Psych: appropriately answers all questions          Palliative Performance Scale: 40%              Additional Data Reviewed: Recent Labs    08/27/23 1718 08/28/23 0641  WBC 10.4 17.2*  HGB 7.4* 8.3*  PLT 62* 75*  NA 132* 128*  BUN 27* 24*  CREATININE 0.97 1.00    Imaging: DG Pelvis 1-2 Views CLINICAL DATA:  Low back pain  EXAM: PELVIS - 1-2 VIEW  COMPARISON:  None Available.  FINDINGS: Right femur is incompletely included. Mild femoroacetabular degenerative change. Pubic symphysis and rami appear intact. No definitive fracture.  IMPRESSION: Mild degenerative change. Note that the right femur/trochanter is incompletely included, follow-up dedicated right hip radiographs may be obtained if desired.  Electronically Signed   By: Luke Bun M.D.   On: 08/27/2023 18:23 DG Lumbar Spine  Complete CLINICAL DATA:  Lower back pain radiating to the right side  EXAM: LUMBAR SPINE - COMPLETE 5 VIEW  COMPARISON:  CT abdomen and pelvis dated 08/06/2023  FINDINGS: There is no evidence of lumbar spine fracture. Alignment is normal. Degenerative changes of the lumbar spine, most pronounced at L4-S1, where there is likely neural foraminal narrowing.  IMPRESSION: Degenerative changes of the lumbar spine, most pronounced at L4-S1, where there is likely neural foraminal narrowing.  Electronically Signed   By: Limin  Xu M.D.   On: 08/27/2023 18:23    I personally reviewed recent imaging.   Palliative Care Assessment and Plan Summary of Established Goals of Care and Medical Treatment Preferences   Patient is a 68 year old male with a past medical history of hypertension, hyperlipidemia, type 2 diabetes mellitus, chronic ITP, and chronic CLL with recent diagnosis of low-grade lymphoma who was admitted on 08/27/2023 for management of worsening pain in his back and leg.  Patient was recently hospitalized from 08/06/2023 - 08/18/2023 for low back pain which is when biopsy was performed showing low grade lymphoma.  Upon admission, patient receiving management for cancer related pain.  Patient undergoing further MRI imaging due to worsening weakness in right lower extremity.  Oncology consulted for recommendations.  Palliative medicine consulted for pain management.  # Symptom management Patient is receiving these palliative interventions for symptom management with an intent to improve quality of life.   - Pain, in setting of recent diagnosis of CLL progressed to low-grade lymphoma   - Obtaining MRI for further imaging workup   -Increase as needed oxycodone  to 10 mg every 4 hours as needed.  Will continue to adjust based on symptom burden.   - Change IV Dilaudid  to 1 mg every 3 hours as needed breakthrough after oral opioids     -Constipation   - Start MiraLAX  17 g daily -   - Start  senna 1 tab twice daily   Continue to monitor bowel movements and adjust medications based on response.  # Psycho-social/Spiritual Support:  - Support System: Wife  # Discharge Planning:  To Be Determined  Thank you for allowing the palliative care team to participate in the care Tyler Scott.  Tinnie Radar, DO Palliative Care Provider PMT # [phone]  If patient remains symptomatic despite maximum doses, please call PMT at [phone] between 0700 and 1900. Outside of these hours, please call attending, as PMT does not have night coverage.  Billing based on MDM: High  Problems Addressed: One or more chronic illnesses with severe exacerbation, progression, or side effects of treatment.  Risks: Parenteral controlled substances

## 2023-08-28 NOTE — Progress Notes (Addendum)
 Progress Note   Patient: Tyler Scott FMW:[id number] DOB: [birth year] DOA: 08/27/2023     0 DOS: the patient was seen and examined on 08/28/2023   Brief hospital course:  Tyler Scott is a 68 y.o. male with medical history significant of HTN, HLD, T2DM, CLL, chronic ITP who presented to ED with intractable back pain leg pain. He was given oral oxycodone  and it's not touching the pain. He states his back pain is throbbing. His weakness in his right leg is worse and is needing assistance for standing. This has not changed from hospital d/c. He has no urinary incontinence or saddle paraesthesias.     Admitted 08/06/23-08/18/23 for low back pain. Had retroperitoneal LN biopsy on 08/09/23. Pathology showed low grade lymphoma. Needed 1 unit PRBC during this stay. Chemotherapy regimen with Bendamustine /rituximab  orders placed to start as outpatient along with weekly Nplate . He had MRI of the lumbar spine done on 08/06/2023 which showed diffuse marrow disease likely due to lymphoma and repeat MRI on 08/28/2023 showed similar finding but slightly worsening.  Patient has been under the care of oncologist and pain medication likely need to be upgraded.  He will be continued in the hospital with pain medications.  Due to his intractable pain he meets inpatient level of care requiring IV pain medications.  Assessment and Plan:  Cancer-related breakthrough pain 68 year old male with known CLL>lymphoma who presented to Ed with complaints of intractable back pain despite his home oral pain medication.  -He has been changed to inpatient due to intractable pain requiring IV pain medications -just finished steroid taper early this week  -give prednisone  40mg  now  -muscle relaxer PRN  -Mri spine below. He can not move his RLL against gravity, but can not really tell me if this is new since hospital d/c. He has no urinary incontinence or saddle paraesthesias. Check MRI lumbar spine w/wo contrast.  -MRI lumbar  spine 08/06/23: Masslike extraosseous paraspinal signal abnormality at multiple levels, and L4 and subtle, equivacal signal in the ventral canal in the lower lumbar canal and sacral canal could potentially represent extraosseous extension of tumor but is incompletely assessed without contrast. If the patient is able, consider postcontrast imaging. -needs adjustment in pain control.  -oxycodone  5mg  QID PRN moderate pain  -dilaudid  PRN -palliative care consult for pain management -Patient to follow-up with oncologist as outpatient as patient has been scheduled for chemotherapy.  Lymphoma, low grade (HCC)  -Biopsy of RP LN 6/30, prelim path shows Low grade Lymphoma consistent with CLL/SLL.   -plans for palliative chemo, but has not started since discharged from hospital on 08/18/23.  -If she does not get better by tomorrow we may need to consult oncology.  CLL (chronic lymphocytic leukemia) (HCC) -- Diagnosed CLL in 2012 -- status post several previous therapies including Bendamustine  and rituximab , Ibrutinib , and most recently on Calquence  since 2023.  -- Patient was supposed to be on Gazyva , venetoclax . He had many interruped cycles of Gazyva  due to No shows.  He was last seen in outpatient oncology on 07/06/23 at which time he was scheduled for C4D1 Gazyva  however was HELD due to infection/acute left sided epidydimo-orchitis for which he was on antibiotics.   Normocytic anemia Hgb of 7.4 today Required 1 PRBC during recent hospital stay Type and screen and transfuse if hgb <7, today's hemoglobin is 8.8  Essential hypertension Continue norvasc  10mg  daily   Controlled type 2 diabetes mellitus with retinopathy, with long-term current use of insulin  (HCC) A1C  of 5.8 in June of 2025  Continue long acting insulin   SSI and accuchecks QAC/HS   Hyperlipidemia Continue zocor  20mg  daily   Chronic ITP (idiopathic thrombocytopenia) (HCC) Platelets at baseline  No VTE prophylaxis  Continue to trend    Hyponatremia May be malignancy related At baseline Monitor   Deconditioning: Will get PT/OT evaluation     Subjective: Complains of severe back pain otherwise no fever no chills   Physical Exam: Vitals:   08/28/23 0148 08/28/23 0537 08/28/23 0919 08/28/23 1336  BP: (!) 141/81 (!) 148/74 123/63 125/63  Pulse: (!) 106 (!) 101 95 97  Resp: 16 16 16 16   Temp: 98.5 F (36.9 C) 98.4 F (36.9 C) 97.9 F (36.6 C) 97.7 F (36.5 C)  TempSrc: Oral Oral Oral Oral  SpO2: 95% 98% 98% 98%  Weight:      Height:       Constitutional: Alert, awake, calm, comfortable HEENT: Neck supple Respiratory: clear to auscultation bilaterally, no wheezing, no crackles. Normal respiratory effort. No accessory muscle use.  Cardiovascular: Regular rate and rhythm, no murmurs / rubs / gallops. No extremity edema. 2+ pedal pulses. No carotid bruits.  Abdomen: no tenderness, no masses palpated. No hepatosplenomegaly. Bowel sounds positive.  Musculoskeletal: Has back pain mild diffuse tenderness around the lumbar region Skin: no rashes, lesions, ulcers. No induration Neurologic: CN 2-12 grossly intact. Sensation intact, DTR normal. Strength 5/5 x all 4 extremities.  Psychiatric: Normal judgment and insight. Alert and oriented x 3. Normal mood.   Data Reviewed:  Diffusely infiltrating marrow of visible skeleton on MRI  Family Communication: None available  Disposition: Status is: Observation The patient will require care spanning > 2 midnights and should be moved to inpatient because: Intractable pain requiring IV pain medication and monitoring in the hospital  Planned Discharge Destination: Home    Time spent: 35 minutes  Author: Nena Rebel, MD 08/28/2023 2:32 PM  For on call review [email or web address].

## 2023-08-29 DIAGNOSIS — R4589 Other symptoms and signs involving emotional state: Secondary | ICD-10-CM

## 2023-08-29 DIAGNOSIS — G893 Neoplasm related pain (acute) (chronic): Secondary | ICD-10-CM | POA: Diagnosis not present

## 2023-08-29 LAB — CBC
HCT: 25.2 % — ABNORMAL LOW (ref 39.0–52.0)
HCT: 25.2 % — ABNORMAL LOW (ref 39.0–52.0)
Hemoglobin: 8.1 g/dL — ABNORMAL LOW (ref 13.0–17.0)
Hemoglobin: 7.9 g/dL — ABNORMAL LOW (ref 13.0–17.0)
MCH: 31.5 pg (ref 26.0–34.0)
MCH: 30.6 pg (ref 26.0–34.0)
MCHC: 32.1 g/dL (ref 30.0–36.0)
MCHC: 31.3 g/dL (ref 30.0–36.0)
MCV: 98.1 fL (ref 80.0–100.0)
MCV: 97.7 fL (ref 80.0–100.0)
Platelets: 74 K/uL — ABNORMAL LOW (ref 150–400)
Platelets: 75 K/uL — ABNORMAL LOW (ref 150–400)
RBC: 2.57 MIL/uL — ABNORMAL LOW (ref 4.22–5.81)
RBC: 2.58 MIL/uL — ABNORMAL LOW (ref 4.22–5.81)
RDW: 15.9 % — ABNORMAL HIGH (ref 11.5–15.5)
RDW: 16 % — ABNORMAL HIGH (ref 11.5–15.5)
WBC: 15.9 K/uL — ABNORMAL HIGH (ref 4.0–10.5)
WBC: 12.9 K/uL — ABNORMAL HIGH (ref 4.0–10.5)
nRBC: 0.2 % (ref 0.0–0.2)
nRBC: 0.2 % (ref 0.0–0.2)

## 2023-08-29 LAB — BASIC METABOLIC PANEL WITH GFR
Anion gap: 10 (ref 5–15)
BUN: 32 mg/dL — ABNORMAL HIGH (ref 8–23)
CO2: 26 mmol/L (ref 22–32)
Calcium: 9.5 mg/dL (ref 8.9–10.3)
Chloride: 95 mmol/L — ABNORMAL LOW (ref 98–111)
Creatinine, Ser: 1.02 mg/dL (ref 0.61–1.24)
GFR, Estimated: >60 mL/min (ref >60)
Glucose, Bld: 229 mg/dL — ABNORMAL HIGH (ref 70–99)
Potassium: 4.7 mmol/L (ref 3.5–5.1)
Sodium: 131 mmol/L — ABNORMAL LOW (ref 135–145)

## 2023-08-29 LAB — MAGNESIUM: Magnesium: 2 mg/dL (ref 1.7–2.4)

## 2023-08-29 LAB — GLUCOSE, CAPILLARY
Glucose-Capillary: 239 mg/dL — ABNORMAL HIGH (ref 70–99)
Glucose-Capillary: 196 mg/dL — ABNORMAL HIGH (ref 70–99)
Glucose-Capillary: 234 mg/dL — ABNORMAL HIGH (ref 70–99)
Glucose-Capillary: 217 mg/dL — ABNORMAL HIGH (ref 70–99)
Glucose-Capillary: 176 mg/dL — ABNORMAL HIGH (ref 70–99)

## 2023-08-29 MED ORDER — HYDROMORPHONE HCL 1 MG/ML IJ SOLN
1.0000 mg | INTRAMUSCULAR | Status: DC | PRN
  Administered 2023-08-29 – 2023-08-31 (×5): 1 mg via INTRAVENOUS
  Filled 2023-08-29 (×5): qty 1

## 2023-08-29 MED ORDER — MORPHINE SULFATE ER 15 MG PO TBCR
15.0000 mg | EXTENDED_RELEASE_TABLET | Freq: Two times a day (BID) | ORAL | Status: DC
  Administered 2023-08-29 – 2023-08-30 (×2): 15 mg via ORAL
  Filled 2023-08-29 (×2): qty 1

## 2023-08-29 MED ORDER — GABAPENTIN 100 MG PO CAPS
100.0000 mg | ORAL_CAPSULE | Freq: Two times a day (BID) | ORAL | Status: DC
  Administered 2023-08-29 – 2023-08-30 (×2): 100 mg via ORAL
  Filled 2023-08-29 (×2): qty 1

## 2023-08-29 MED ORDER — HYDROMORPHONE HCL 2 MG PO TABS
1.0000 mg | ORAL_TABLET | ORAL | Status: DC | PRN
  Administered 2023-08-29 – 2023-08-30 (×5): 2 mg via ORAL
  Filled 2023-08-29 (×5): qty 1

## 2023-08-29 NOTE — Plan of Care (Signed)
  Problem: Education: Goal: Ability to describe self-care measures that may prevent or decrease complications (Diabetes Survival Skills Education) will improve Outcome: Progressing   Problem: Coping: Goal: Ability to adjust to condition or change in health will improve Outcome: Progressing   Problem: Fluid Volume: Goal: Ability to maintain a balanced intake and output will improve Outcome: Progressing   Problem: Skin Integrity: Goal: Risk for impaired skin integrity will decrease Outcome: Progressing   Problem: Education: Goal: Knowledge of General Education information will improve Description: Including pain rating scale, medication(s)/side effects and non-pharmacologic comfort measures Outcome: Progressing

## 2023-08-29 NOTE — Progress Notes (Signed)
 Progress Note   Patient: Tyler Scott FMW:[id number] DOB: December 09, 1955 DOA: 08/27/2023     1 DOS: the patient was seen and examined on 08/29/2023   Brief hospital course:  Tyler Scott is a 68 y.o. male with medical history significant of HTN, HLD, T2DM, CLL, chronic ITP who presented to ED with intractable back pain leg pain. He was given oral oxycodone  and it's not touching the pain. He states his back pain is throbbing. His weakness in his right leg is worse and is needing assistance for standing. This has not changed from hospital d/c. He has no urinary incontinence or saddle paraesthesias.     Admitted 08/06/23-08/18/23 for low back pain. Had retroperitoneal LN biopsy on 08/09/23. Pathology showed low grade lymphoma. Needed 1 unit PRBC during this stay. Chemotherapy regimen with Bendamustine /rituximab  orders placed to start as outpatient along with weekly Nplate . He had MRI of the lumbar spine done on 08/06/2023 which showed diffuse marrow disease likely due to lymphoma and repeat MRI on 08/28/2023 showed similar finding but slightly worsening.  Patient has been under the care of oncologist and pain medication likely need to be upgraded.  He will be continued in the hospital with pain medications.  Due to his intractable pain he meets inpatient level of care requiring IV pain medications.  Palliative care consultation was done regarding pain management and appreciate Dr. Gaetana  recommendation.  Assessment and Plan:  Cancer-related breakthrough pain 68 year old male with known CLL>lymphoma who presented to Ed with complaints of intractable back pain despite his home oral pain medication.  -He has been changed to inpatient due to intractable pain requiring IV pain medications -just finished steroid taper early this week  -give prednisone  40mg  now  -muscle relaxer PRN  -Mri spine below. He can not move his RLL against gravity, but can not really tell me if this is new since hospital d/c.  He has no urinary incontinence or saddle paraesthesias. Check MRI lumbar spine w/wo contrast.  -MRI lumbar spine 08/06/23: Masslike extraosseous paraspinal signal abnormality at multiple levels, and L4 and subtle, equivacal signal in the ventral canal in the lower lumbar canal and sacral canal could potentially represent extraosseous extension of tumor but is incompletely assessed without contrast. If the patient is able, consider postcontrast imaging. -needs adjustment in pain control.  - MS Contin  15 mg twice daily, Dilaudid  1-2 milligram p.o. every 4 hours as needed, IV Dilaudid  and gabapentin  increased to 100 twice daily. -palliative care consult for pain management appreciated.  -Patient to follow-up with oncologist as outpatient as patient has been scheduled for chemotherapy.   Lymphoma, low grade (HCC)  -Biopsy of RP LN 6/30, prelim path shows Low grade Lymphoma consistent with CLL/SLL.   -plans for palliative chemo, but has not started since discharged from hospital on 08/18/23.  -If she does not get better by tomorrow we may need to consult oncology.   CLL (chronic lymphocytic leukemia) (HCC) -- Diagnosed CLL in 2012 -- status post several previous therapies including Bendamustine  and rituximab , Ibrutinib , and most recently on Calquence  since 2023.  -- Patient was supposed to be on Gazyva , venetoclax . He had many interruped cycles of Gazyva  due to No shows.  He was last seen in outpatient oncology on 07/06/23 at which time he was scheduled for C4D1 Gazyva  however was HELD due to infection/acute left sided epidydimo-orchitis for which he was on antibiotics.    Normocytic anemia Hgb of 7.4 today Required 1 PRBC during recent hospital stay  Type and screen and transfuse if hgb <7, today's hemoglobin is 8.8   Essential hypertension Continue norvasc  10mg  daily    Controlled type 2 diabetes mellitus with retinopathy, with long-term current use of insulin  (HCC) A1C of 5.8 in June of 2025   Continue long acting insulin   SSI and accuchecks QAC/HS    Hyperlipidemia Continue zocor  20mg  daily    Chronic ITP (idiopathic thrombocytopenia) (HCC) Platelets at baseline  No VTE prophylaxis  Continue to trend    Hyponatremia May be malignancy related At baseline Monitor    Deconditioning: Will get PT/OT evaluation          Subjective: abdominal pain  Physical Exam: Vitals:   08/28/23 2111 08/29/23 0505 08/29/23 0846 08/29/23 1312  BP: 130/65 127/63 127/63 (!) 125/51  Pulse: 95 (!) 108  (!) 101  Resp: 16 18  16   Temp: 99 F (37.2 C) 97.9 F (36.6 C)  98.6 F (37 C)  TempSrc: Oral Oral  Oral  SpO2: 100% 100%  99%  Weight:      Height:       Constitutional: Alert, awake, calm, comfortable HEENT: Neck supple Respiratory: clear to auscultation bilaterally, no wheezing, no crackles. Normal respiratory effort. No accessory muscle use.  Cardiovascular: Regular rate and rhythm, no murmurs / rubs / gallops. No extremity edema. 2+ pedal pulses. No carotid bruits.  Abdomen: no tenderness, no masses palpated. No hepatosplenomegaly. Bowel sounds positive.  Musculoskeletal: no clubbing / cyanosis. No joint deformity upper and lower extremities. Good ROM, no contractures. Normal muscle tone.  Skin: no rashes, lesions, ulcers. No induration Neurologic: CN 2-12 grossly intact. Sensation intact, DTR normal. Strength 5/5 x all 4 extremities.  Psychiatric: Normal judgment and insight. Alert and oriented x 3. Normal mood.   Data Reviewed:  Na 131, K 4.7, WBC: 12.9   Family Communication: None available  Disposition: Status is: Inpatient Remains inpatient appropriate because: Ongoing pain control  Planned Discharge Destination: Home    Time spent: 35 minutes  Author: Nena Rebel, MD 08/29/2023 2:12 PM  For on call review [email or web address].

## 2023-08-29 NOTE — Progress Notes (Signed)
 Daily Progress Note   Patient Name: Tyler Scott       Date: 08/29/2023 DOB: [birth year]  Age: 68 y.o. MRN#: [id number] Attending Physician: Roann Gouty, MD Primary Care Physician: [name], MD Admit Date: 08/27/2023 Length of Stay: 1 day  Reason for Consultation/Follow-up: Pain control  Subjective:   CC: Patient noting continued pain.  Following up regarding pain management.  Subjective:  Reviewed documentation in EMR including hospitalist note.  At time of EMR review in past 24 hours patient has received as needed IV Dilaudid  1 mg x 2 doses and as needed oxycodone  10 mg x 4 doses.  Patient was started on gabapentin  100 mg nightly on 08/28/2023.  Patient's last noted BM was 08/28/2023.  Reviewed patient's lumbar spine MRI noting diffusely infiltrated marrow of the visible skeletal structures most consistent with diffuse skeletal lymphoma; no signs of pathologic fracture.  MRI also noted subtle evidence of early extraosseous extension of disease at some levels though no malignant spinal stenosis or neural impingement.  Patient was noted to have bulky and severe lymphadenopathy throughout visible abdomen and pelvis though this was not changed from CT last month.  Oncology involved for recommendations.  Presented to bedside to see patient.  No visitors present at bedside.  Again introduced myself as a member of the palliative medicine team to assist with patient's pain management.  Inquired about use of as needed medications over the past 24 hours.  Patient notes that he had awoken overnight and severe pain.  Patient expressing concerns that he had difficulties getting pain medication when needed.  Encouraged patient to ask team for as needed medications and that these medications could be adjusted further based on patient's report of symptoms.  Patient acknowledged this though had difficulty retaining this information throughout conversation with this provider.   Inquired about use  of as needed oxycodone  use to assist with pain management.  Patient had difficulty describing if oxycodone  assisted with pain management at all or if it did not help with pain management enough.  Patient kept repeatedly saying that different provider had told him oxycodone  would not work so he does not want to take that medication.  Patient does not feel it does anything for his pain.  Acknowledged this and noted could rotate opioids if desired since patient refusing to now take oxycodone .  Patient agreeing with this plan.  Noted would start as needed oral Dilaudid  to assist with short acting pain management.  Again spent extensive time explaining to patient that he must take this medication 30-45 minutes before activities or early enough when having pain because it takes that long to work.  If patient waits until pain is severe, oral medications will likely not help enough.  Again patient having difficulties processing this.  Noted would inform PT/OT of recommendation to take medication prior to working with them as movement and standing does worsen pain.   Also discussed based on patient's OME requirements, going to start long-acting pain medication.  Since patient does not want to take any form of oxycodone , will start MS Contin  15 mg every 12 hours.  Explained that this medication will be scheduled and brought into patient accordingly.  Patient agreeing with addition of MS Contin .  Explained patient just has to ask for as needed medications for short acting pain management.  During conversation patient could be tangential and thought.  Informed care team including hospitalist of this.  Defer any further workup to hospitalist if needed.   Patient expressing  multiple frustrations though appears frustrated with illness requiring him to be in hospital.  Empathized with difficult situation and spent time providing emotional support via active listening.  Answered all questions as able at that time.  Noted  palliative medicine team continue to follow with patient's medical journey.  Discussed care with team including hospitalist, RN, and PT/OT to coordinate care.  Review of Systems Back pain Objective:   Vital Signs:  BP 127/63 (BP Location: Right Arm)   Pulse (!) 108   Temp 97.9 F (36.6 C) (Oral)   Resp 18   Ht 5' 8 (1.727 m)   Wt 62.7 kg   SpO2 100%   BMI 21.02 kg/m   Physical Exam: General: NAD, alert, pleasant Cardiovascular: RRR Respiratory: no increased work of breathing noted, not in respiratory distress Neuro: Awake, interactive Psych: Tangential at times  Assessment & Plan:   Assessment: Patient is a 68 year old male with a past medical history of hypertension, hyperlipidemia, type 2 diabetes mellitus, chronic ITP, and chronic CLL with recent diagnosis of low-grade lymphoma who was admitted on 08/27/2023 for management of worsening pain in his back and leg.  Patient was recently hospitalized from 08/06/2023 - 08/18/2023 for low back pain which is when biopsy was performed showing low grade lymphoma.  Upon admission, patient receiving management for cancer related pain.  Patient undergoing further MRI imaging due to worsening weakness in right lower extremity.  Oncology consulted for recommendations.  Palliative medicine consulted for pain management.   Recommendations/Plan: # Symptom management Patient is receiving these palliative interventions for symptom management with an intent to improve quality of life.                 - Pain, in setting of recent diagnosis of CLL progressed to low-grade lymphoma Within the past 24 hours hours, patient has required as needed oxycodone  20 mg and as needed IV Dilaudid  2 mg for opioid management. Based on OMEs calculated for this dose, which would be approximately 37.5 OME when reducing by 50% for incomplete cross tolerance, will appropriately start patient on the listed regimen below.                                - MRI obtained  08/28/2023 reviewed.  Defer to oncology regarding cancer management.                               - Stop as needed oxycodone .  Patient does not feel the oxycodone  is assisting with his pain at all and remembers being told by a different provider this would not help so does not want to take at this time.   - Start as needed oral Dilaudid  1-2 mg every 4 hours as needed   - Start MS Contin  15 mg every 12 hours scheduled.  Continue to monitor renal function in the setting.  Did not start scheduled OxyContin  since patient does not want to take any form of oxycodone  at this time.                                -Continue IV Dilaudid  to 1 mg every 3 hours as needed breakthrough after oral opioids                               -  Change gabapentin  to 100 mg twice daily                 -Constipation                               - Continue MiraLAX  17 g daily             -                               - Continue senna 1 tab twice daily                               Continue to monitor bowel movements and adjust medications based on response.   # Psycho-social/Spiritual Support:  - Support System: Wife   # Discharge Planning:  To Be Determined  Discussed with: Patient, hospitalist, RN, PT/OT  Thank you for allowing the palliative care team to participate in the care Tyler Scott.  Tinnie Radar, DO Palliative Care Provider PMT # [phone]  If patient remains symptomatic despite maximum doses, please call PMT at [phone] between 0700 and 1900. Outside of these hours, please call attending, as PMT does not have night coverage.  Personally spent 50 minutes in patient care including extensive chart review (labs, imaging, progress/consult notes, vital signs), medically appropraite exam, discussed with treatment team, education to patient, family, and staff, documenting clinical information, medication review and management, coordination of care, and available advanced directive documents.

## 2023-08-29 NOTE — Plan of Care (Signed)
   Problem: Education: Goal: Knowledge of General Education information will improve Description: Including pain rating scale, medication(s)/side effects and non-pharmacologic comfort measures Outcome: Progressing   Problem: Activity: Goal: Risk for activity intolerance will decrease Outcome: Progressing   Problem: Nutrition: Goal: Adequate nutrition will be maintained Outcome: Progressing

## 2023-08-29 NOTE — Evaluation (Addendum)
 Occupational Therapy Evaluation Patient Details Name: Tyler Scott MRN: [id number] DOB: 05-26-1955 Today's Date: 08/29/2023   History of Present Illness   Pt is a 68 y/o male presenting on 7/18 with back and leg pain.  Noted recent admission 6/27-7/9 for low back pain.  Admitted for cancer related breath through pain. MRI 7/19 with showed diffuse marrow disease likely due to lymphoma slightly worsening per MD. PMH includes: allergy, CLL, DM1, diabetic retinopathy, HTN.     Clinical Impressions PTA patient reports managing mobility using RW with modified independence, Adls with supervision to modified independence and assist for IADLs.  Admitted for above and presents with problem list below.  He requires min assist for transfers using RW, contact guard for mobility using RW and up to mod assist for Adls.  He is pleasant and cooperative, voices frustration with pain and requires cueing to sustain attention to tasks.  Will benefit from higher level cognitive testing to ensure safety with med mgmt at dc. Based on performance today, pt will best benefit from continued OT services acutely and after dc at [facility] level to optimize independence and safety with ADLs, IADLs and mobility. Would recommend increased supervision at home for safety and assistance with medication management at this time.        If plan is discharge home, recommend the following:   Help with stairs or ramp for entrance;Assist for transportation;Assistance with cooking/housework;A little help with walking and/or transfers;A little help with bathing/dressing/bathroom;Direct supervision/assist for medications management;Direct supervision/assist for financial management     Functional Status Assessment   Patient has had a recent decline in their functional status and demonstrates the ability to make significant improvements in function in a reasonable and predictable amount of time.     Equipment Recommendations    None recommended by OT     Recommendations for Other Services         Precautions/Restrictions   Precautions Precautions: Fall Recall of Precautions/Restrictions: Intact Restrictions Weight Bearing Restrictions Per Provider Order: No     Mobility Bed Mobility               General bed mobility comments: EOB upon entry    Transfers Overall transfer level: Needs assistance Equipment used: Rolling walker (2 wheels) Transfers: Sit to/from Stand Sit to Stand: Min assist           General transfer comment: min assist with cueing for technique and hand placement for power up and safety      Balance Overall balance assessment: Needs assistance Sitting-balance support: Feet supported, No upper extremity supported Sitting balance-Leahy Scale: Good     Standing balance support: Bilateral upper extremity supported, During functional activity, No upper extremity supported Standing balance-Leahy Scale: Fair Standing balance comment: relies on RW, able to stand without BUE during ADLs with contact guard assist                           ADL either performed or assessed with clinical judgement   ADL Overall ADL's : Needs assistance/impaired     Grooming: Set up;Sitting           Upper Body Dressing : Set up;Sitting   Lower Body Dressing: Minimal assistance;Sit to/from stand   Toilet Transfer: Minimal assistance;Contact guard assist;Ambulation;Rolling walker (2 wheels) Toilet Transfer Details (indicate cue type and reason): simulated         Functional mobility during ADLs: Minimal assistance;Contact guard assist;Rolling walker (2 wheels)  Vision   Vision Assessment?: No apparent visual deficits     Perception         Praxis         Pertinent Vitals/Pain Pain Assessment Pain Assessment: Faces Faces Pain Scale: Hurts even more Pain Location: back Pain Descriptors / Indicators: Guarding, Sore Pain Intervention(s): Limited  activity within patient's tolerance, Monitored during session, Repositioned     Extremity/Trunk Assessment Upper Extremity Assessment Upper Extremity Assessment: Right hand dominant;Generalized weakness   Lower Extremity Assessment Lower Extremity Assessment: Defer to PT evaluation   Cervical / Trunk Assessment Cervical / Trunk Assessment: Normal   Communication Communication Communication: No apparent difficulties   Cognition Arousal: Alert Behavior During Therapy: WFL for tasks assessed/performed Cognition: Cognition impaired     Awareness: Online awareness impaired Memory impairment (select all impairments): Short-term memory Attention impairment (select first level of impairment): Sustained attention Executive functioning impairment (select all impairments): Problem solving, Organization OT - Cognition Comments: pt verbose and requires redirection to tasks, would benefit from higher level cognitive testing                 Following commands: Intact       Cueing  General Comments   Cueing Techniques: Verbal cues      Exercises     Shoulder Instructions      Home Living Family/patient expects to be discharged to:: Private residence Living Arrangements: Spouse/significant other Available Help at Discharge: Family Type of Home: House Home Access: Stairs to enter [employer] of Steps: 2 or 4 Entrance Stairs-Rails: Right Home Layout: Two level;1/2 bath on main level Alternate Level Stairs-Number of Steps: 14   Bathroom Shower/Tub: Producer, television/film/video: Standard     Home Equipment: Cane - single point;Crutches;Shower seat   Additional Comments: reports sleeping on blow up bed      Prior Functioning/Environment Prior Level of Function : Needs assist             Mobility Comments: reports using RW for mobility without assist ADLs Comments: reports able to manage ADLs, spouse assists with IADLs and showers, LB dressing as  needed    OT Problem List: Decreased strength;Decreased activity tolerance;Impaired balance (sitting and/or standing);Decreased cognition;Decreased safety awareness;Decreased knowledge of use of DME or AE;Decreased knowledge of precautions;Pain   OT Treatment/Interventions: Self-care/ADL training;Therapeutic exercise;DME and/or AE instruction;Therapeutic activities;Patient/family education;Balance training;Energy conservation;Cognitive remediation/compensation      OT Goals(Current goals can be found in the care plan section)   Acute Rehab OT Goals Patient Stated Goal: less pain OT Goal Formulation: With patient Time For Goal Achievement: 09/12/23 Potential to Achieve Goals: Fair   OT Frequency:  Min 2X/week    Co-evaluation PT/OT/SLP Co-Evaluation/Treatment: Yes Reason for Co-Treatment: For patient/therapist safety;Other (comment);To address functional/ADL transfers (pain tolerance)          AM-PAC OT 6 Clicks Daily Activity     Outcome Measure Help from another person eating meals?: None Help from another person taking care of personal grooming?: A Little Help from another person toileting, which includes using toliet, bedpan, or urinal?: A Little Help from another person bathing (including washing, rinsing, drying)?: A Little Help from another person to put on and taking off regular upper body clothing?: A Little Help from another person to put on and taking off regular lower body clothing?: A Little 6 Click Score: 19   End of Session Equipment Utilized During Treatment: Rolling walker (2 wheels);Gait belt Nurse Communication: Mobility status  Activity Tolerance: Patient tolerated  treatment well Patient left: in chair;with chair alarm set;with call bell/phone within reach  OT Visit Diagnosis: Unsteadiness on feet (R26.81);Pain Pain - part of body:  (low back)                Time: 9094-9054 OT Time Calculation (min): 40 min Charges:  OT General Charges $OT Visit:  1 Visit OT Evaluation $OT Eval Moderate Complexity: 1 Mod  Etta NOVAK, OT Acute Rehabilitation Services Office [phone] Secure Chat Preferred    Etta GORMAN Hope 08/29/2023, 12:42 PM

## 2023-08-29 NOTE — Evaluation (Signed)
 Physical Therapy Evaluation Patient Details Name: Tyler Scott MRN: [id number] DOB: Feb 17, 1955 Today's Date: 08/29/2023  History of Present Illness  Pt is a 68 y/o male presenting on 7/18 with back and leg pain.  Noted recent admission 6/27-7/9 for low back pain.  Admitted for cancer related breath through pain. MRI 7/19 with showed diffuse marrow disease likely due to lymphoma slightly worsening per MD. PMH includes: allergy, CLL, DM1, diabetic retinopathy, HTN.  Clinical Impression  PTA patient reports managing mobility using RW with modified independence, Adls with supervision to modified independence and assist for IADLs.  Admitted for above and presents with problem list below.  He requires min assist for transfers using RW, contact guard for mobility using RW and up to mod assist for Adls.  He is pleasant and cooperative, voices frustration with pain and requires cueing to sustain attention to tasks.  Will benefit from higher level cognitive testing to ensure safety with med mgmt at dc. Based on performance today, pt will best benefit from continued PT services acutely and after dc at HHPT level to optimize independence and safety with mobility. Would also benefit from increased supervision at home for safety and assistance as needed.      If plan is discharge home, recommend the following: Assistance with cooking/housework;Assist for transportation;Help with stairs or ramp for entrance;A little help with walking and/or transfers;A little help with bathing/dressing/bathroom   Can travel by private vehicle        Equipment Recommendations None recommended by PT  Recommendations for Other Services       Functional Status Assessment Patient has had a recent decline in their functional status and demonstrates the ability to make significant improvements in function in a reasonable and predictable amount of time.     Precautions / Restrictions Precautions Precautions:  Fall Recall of Precautions/Restrictions: Intact Restrictions Weight Bearing Restrictions Per Provider Order: No      Mobility  Bed Mobility               General bed mobility comments: Pt at EOB upon entry to room - states he moved there unassisted    Transfers Overall transfer level: Needs assistance Equipment used: Rolling walker (2 wheels) Transfers: Sit to/from Stand Sit to Stand: Min assist           General transfer comment: min assist with cueing for technique and hand placement for power up and safety    Ambulation/Gait Ambulation/Gait assistance: Contact guard assist Gait Distance (Feet): 68 Feet Assistive device: Rolling walker (2 wheels) Gait Pattern/deviations: Step-to pattern, Step-through pattern, Decreased step length - right, Decreased step length - left, Shuffle, Trunk flexed Gait velocity: decr     General Gait Details: Steady assist with cues for posture, position from RW and initial sequence  Stairs            Wheelchair Mobility     Tilt Bed    Modified Rankin (Stroke Patients Only)       Balance Overall balance assessment: Needs assistance Sitting-balance support: Feet supported, No upper extremity supported Sitting balance-Leahy Scale: Good     Standing balance support: No upper extremity supported Standing balance-Leahy Scale: Fair Standing balance comment: relies on RW, able to stand without BUE during ADLs with contact guard assist                             Pertinent Vitals/Pain Pain Assessment Pain Assessment: Faces Faces Pain Scale:  Hurts even more Pain Location: back and R LE Pain Descriptors / Indicators: Guarding, Sore Pain Intervention(s): Limited activity within patient's tolerance, Monitored during session, Premedicated before session    Home Living Family/patient expects to be discharged to:: Private residence Living Arrangements: Spouse/significant other Available Help at Discharge:  Family Type of Home: House Home Access: Stairs to enter Entrance Stairs-Rails: Right Entrance Stairs-Number of Steps: 2 or 4 Alternate Level Stairs-Number of Steps: 14 Home Layout: Two level;1/2 bath on main level Home Equipment: Cane - single point;Crutches;Shower seat Additional Comments: reports sleeping on blow up bed on main level    Prior Function Prior Level of Function : Needs assist             Mobility Comments: reports using RW for mobility without assist ADLs Comments: reports able to manage ADLs, spouse assists with IADLs and showers, LB dressing as needed     Extremity/Trunk Assessment   Upper Extremity Assessment Upper Extremity Assessment: Right hand dominant    Lower Extremity Assessment Lower Extremity Assessment: LLE deficits/detail LLE Deficits / Details: L LE weaker then R LE LLE: Unable to fully assess due to pain    Cervical / Trunk Assessment Cervical / Trunk Assessment: Normal  Communication   Communication Communication: No apparent difficulties    Cognition Arousal: Alert Behavior During Therapy: WFL for tasks assessed/performed   PT - Cognitive impairments: Other                       PT - Cognition Comments: Lack of focus and very tangential thinking Following commands: Intact       Cueing Cueing Techniques: Verbal cues     General Comments      Exercises     Assessment/Plan    PT Assessment Patient needs continued PT services  PT Problem List Decreased strength;Decreased activity tolerance;Decreased balance;Decreased mobility;Decreased knowledge of use of DME;Pain       PT Treatment Interventions DME instruction;Gait training;Balance training;Functional mobility training;Therapeutic activities;Therapeutic exercise;Patient/family education;Stair training    PT Goals (Current goals can be found in the Care Plan section)  Acute Rehab PT Goals Patient Stated Goal: REgain IND PT Goal Formulation: With  patient Time For Goal Achievement: 08/21/23 Potential to Achieve Goals: Good    Frequency Min 3X/week     Co-evaluation PT/OT/SLP Co-Evaluation/Treatment: Yes Reason for Co-Treatment: For patient/therapist safety;Other (comment);To address functional/ADL transfers (pain tolerance)           AM-PAC PT 6 Clicks Mobility  Outcome Measure Help needed turning from your back to your side while in a flat bed without using bedrails?: None Help needed moving from lying on your back to sitting on the side of a flat bed without using bedrails?: None Help needed moving to and from a bed to a chair (including a wheelchair)?: A Little Help needed standing up from a chair using your arms (e.g., wheelchair or bedside chair)?: A Little Help needed to walk in hospital room?: A Little Help needed climbing 3-5 steps with a railing? : A Lot 6 Click Score: 19    End of Session Equipment Utilized During Treatment: Gait belt Activity Tolerance: Patient limited by fatigue;Patient limited by pain Patient left: in chair;with call bell/phone within reach Nurse Communication: Mobility status PT Visit Diagnosis: Difficulty in walking, not elsewhere classified (R26.2);Unsteadiness on feet (R26.81);Muscle weakness (generalized) (M62.81)    Time: 9091-9052 PT Time Calculation (min) (ACUTE ONLY): 39 min   Charges:   PT Evaluation $PT Eval Moderate  Complexity: 1 Mod PT Treatments $Gait Training: 8-22 mins PT General Charges $$ ACUTE PT VISIT: 1 Visit         [facility] PT Acute Rehabilitation Services Office [phone]   Maurica Omura 08/29/2023, 1:04 PM

## 2023-08-30 DIAGNOSIS — C8590 Non-Hodgkin lymphoma, unspecified, unspecified site: Secondary | ICD-10-CM | POA: Diagnosis not present

## 2023-08-30 DIAGNOSIS — C9110 Chronic lymphocytic leukemia of B-cell type not having achieved remission: Secondary | ICD-10-CM | POA: Diagnosis not present

## 2023-08-30 DIAGNOSIS — Z7189 Other specified counseling: Secondary | ICD-10-CM

## 2023-08-30 DIAGNOSIS — Z515 Encounter for palliative care: Secondary | ICD-10-CM | POA: Diagnosis not present

## 2023-08-30 DIAGNOSIS — Z79899 Other long term (current) drug therapy: Secondary | ICD-10-CM

## 2023-08-30 DIAGNOSIS — G893 Neoplasm related pain (acute) (chronic): Secondary | ICD-10-CM | POA: Diagnosis not present

## 2023-08-30 LAB — CBC
HCT: 23.5 % — ABNORMAL LOW (ref 39.0–52.0)
Hemoglobin: 7.5 g/dL — ABNORMAL LOW (ref 13.0–17.0)
MCH: 31 pg (ref 26.0–34.0)
MCHC: 31.9 g/dL (ref 30.0–36.0)
MCV: 97.1 fL (ref 80.0–100.0)
Platelets: 66 K/uL — ABNORMAL LOW (ref 150–400)
RBC: 2.42 MIL/uL — ABNORMAL LOW (ref 4.22–5.81)
RDW: 15.8 % — ABNORMAL HIGH (ref 11.5–15.5)
WBC: 9.8 K/uL (ref 4.0–10.5)
nRBC: 0.4 % — ABNORMAL HIGH (ref 0.0–0.2)

## 2023-08-30 LAB — BASIC METABOLIC PANEL WITH GFR
Anion gap: 10 (ref 5–15)
BUN: 27 mg/dL — ABNORMAL HIGH (ref 8–23)
CO2: 26 mmol/L (ref 22–32)
Calcium: 9.4 mg/dL (ref 8.9–10.3)
Chloride: 94 mmol/L — ABNORMAL LOW (ref 98–111)
Creatinine, Ser: 1.12 mg/dL (ref 0.61–1.24)
GFR, Estimated: >60 mL/min (ref >60)
Glucose, Bld: 184 mg/dL — ABNORMAL HIGH (ref 70–99)
Potassium: 4.2 mmol/L (ref 3.5–5.1)
Sodium: 130 mmol/L — ABNORMAL LOW (ref 135–145)

## 2023-08-30 LAB — GLUCOSE, CAPILLARY
Glucose-Capillary: 173 mg/dL — ABNORMAL HIGH (ref 70–99)
Glucose-Capillary: 186 mg/dL — ABNORMAL HIGH (ref 70–99)
Glucose-Capillary: 168 mg/dL — ABNORMAL HIGH (ref 70–99)
Glucose-Capillary: 144 mg/dL — ABNORMAL HIGH (ref 70–99)

## 2023-08-30 MED ORDER — GABAPENTIN 100 MG PO CAPS
100.0000 mg | ORAL_CAPSULE | Freq: Three times a day (TID) | ORAL | Status: DC
  Administered 2023-08-30 – 2023-09-02 (×10): 100 mg via ORAL
  Filled 2023-08-30 (×11): qty 1

## 2023-08-30 MED ORDER — HYDROMORPHONE HCL 2 MG PO TABS
2.0000 mg | ORAL_TABLET | ORAL | Status: DC | PRN
  Administered 2023-08-30 – 2023-08-31 (×3): 2 mg via ORAL
  Filled 2023-08-30 (×3): qty 1

## 2023-08-30 MED ORDER — MORPHINE SULFATE ER 15 MG PO TBCR
15.0000 mg | EXTENDED_RELEASE_TABLET | Freq: Three times a day (TID) | ORAL | Status: DC
  Administered 2023-08-30 – 2023-09-02 (×10): 15 mg via ORAL
  Filled 2023-08-30 (×10): qty 1

## 2023-08-30 NOTE — Progress Notes (Signed)
   08/30/23 1630  Spiritual Encounters  Type of Visit Initial  Care provided to: Patient  Reason for visit Advance directives  OnCall Visit No  Interventions  Spiritual Care Interventions Made Established relationship of care and support;Compassionate presence;Decision-making support/facilitation  Intervention Outcomes  Outcomes Connection to spiritual care;Awareness around self/spiritual resourses;Autonomy/agency  Advance Directives (For Healthcare)  Does Patient Have a Medical Advance Directive? No  Would patient like information on creating a medical advance directive? Yes (Inpatient - patient defers creating a medical advance directive at this time - Information given)    Chaplain responded to spiritual consult request for AD paperwork/information. Kais wishes for his wife to serve as Product manager. Paperwork and information given and all questions answered. Chaplains remain available for any future emotional/spiritual needs.

## 2023-08-30 NOTE — Progress Notes (Signed)
 Daily Progress Note   Patient Name: Tyler Scott       Date: 08/30/2023 DOB: Jul 05, 1955  Age: 68 y.o. MRN#: [id number] Attending Physician: Roann Gouty, MD Primary Care Physician: [name], MD Admit Date: 08/27/2023 Length of Stay: 2 days  Reason for Consultation/Follow-up: Pain control  Subjective:   CC: Patient noting pain medications are helping more.  Following up regarding pain management.  Subjective:  Reviewed documentation in EMR including recent documentation from [facility], PT/OT, and hospitalist.  Reviewed patient's BMP today noting BUN 27 and creatinine 1.12, so GFR over 60. At time of EMR review in past 24 hours patient has received as needed IV Dilaudid  1 mg x 3 doses and as needed p.o. Dilaudid  2 mg x 4 doses.  Patient was started on MS Contin  15 mg every 12 hours on 08/29/2023.  Presented to bedside to see patient.  Patient sitting on side of bed.  No visitors present at bedside.  Patient noting still having pain when standing and moving.  Patient does feel that pain medications have been helping with management.  Patient feels oral Dilaudid  2 mg assist with pain when received.  Patient noted that he thinks he is waiting too long to ask for this medication.  I again discussed it is available every 4 hours as needed and to not wait until his pain is severe as then it would take too long to work.  Discussed based on patient's OME requirements, discussed can increase long-acting pain medication.  Patient agreement with this plan.  Patient also feels that his nerve pain has been improving so discussed increasing gabapentin  to 100 mg 3 times daily.  Patient noted he was scheduled for infusion with oncologist, Dr. Onesimo, on Wednesday prior to this hospitalization.  Noted would reach out to oncologist to assist in conveying message about determining next steps regarding cancer directed therapies.  Also discussed how in the outpatient setting, patient would continue to  follow-up with palliative medicine team at [facility].  During conversation, TOC presented to bedside so excuse self so they could discuss discharge planning moving forward.  All questions answered at that time.  Noted palliative medicine team to continue following the patient's medical journey.  Discussed care with hospitalist, TOC, RN, and oncologist to coordinate care.  During conversation did inform team about patient's difficulties with short-term memory which has been present since admission so unsure if this is baseline.  Multiple team members have noticed this.  I have informed hospitalist and defer workup to hospitalist if needed.  Review of Systems Back pain Objective:   Vital Signs:  BP 128/65 (BP Location: Right Arm)   Pulse 91   Temp (!) 97.5 F (36.4 C) (Oral)   Resp 16   Ht 5' 8 (1.727 m)   Wt 62.7 kg   SpO2 96%   BMI 21.02 kg/m   Physical Exam: General: NAD, awake, pleasant Cardiovascular: RRR Respiratory: no increased work of breathing noted, not in respiratory distress Neuro: Awake, interactive Psych: Tangential at times  Assessment & Plan:   Assessment: Patient is a 68 year old male with a past medical history of hypertension, hyperlipidemia, type 2 diabetes mellitus, chronic ITP, and chronic CLL with recent diagnosis of low-grade lymphoma who was admitted on 08/27/2023 for management of worsening pain in his back and leg.  Patient was recently hospitalized from 08/06/2023 - 08/18/2023 for low back pain which is when biopsy was performed showing low grade lymphoma.  Upon admission, patient receiving management for cancer related  pain.  Patient undergoing further MRI imaging due to worsening weakness in right lower extremity.  Oncology consulted for recommendations.  Palliative medicine consulted for pain management.   Recommendations/Plan: # Symptom management Patient is receiving these palliative interventions for symptom management with an intent to improve quality of  life.                 - Pain, in setting of recent diagnosis of CLL progressed to low-grade lymphoma Within the past 24 hours hours, patient has required as needed as needed IV Dilaudid  3 mg and p.o. Dilaudid  8 mg for breakthrough opioid management. Based on OMEs calculated for this dose, which would be approximately 68 OME when reducing by 50% for incomplete cross tolerance, will appropriately start patient on the listed regimen below.                                - MRI obtained 08/28/2023 reviewed.  Defer to oncology regarding cancer management.   - Change as needed oral Dilaudid  to 2 mg every 4 hours as needed   - Increase MS Contin  15 mg every 8 hours scheduled during the day.  Slowly increasing to monitor status and minimize adverse effects.  Will likely need further increases.  Continue to monitor renal function in the setting.  Did not start scheduled OxyContin  since patient does not want to take any form of oxycodone  at this time.                                -Continue IV Dilaudid  to 1 mg every 3 hours as needed breakthrough after oral opioids                               -Change gabapentin  to 100 mg 3 times daily                 -Constipation                               - Continue MiraLAX  17 g daily             -                               - Continue senna 1 tab twice daily                               Continue to monitor bowel movements and adjust medications based on response.   # Psycho-social/Spiritual Support:  - Support System: Wife   # Discharge Planning: Home with home health and PMT follow-up at [facility] when medically appropriate  Discussed with: Patient, hospitalist, RN, oncology  Thank you for allowing the palliative care team to participate in the care Tyler Scott.  Tinnie Radar, DO Palliative Care Provider PMT # [phone]  If patient remains symptomatic despite maximum doses, please call PMT at [phone] between 0700 and 1900. Outside of these  hours, please call attending, as PMT does not have night coverage.

## 2023-08-30 NOTE — TOC Initial Note (Signed)
 Transition of Care [facility]) - Initial/Assessment Note    Patient Details  Name: Tyler Scott MRN: [id number] Date of Birth: [birth year]  Transition of Care [facility]) CM/SW Contact:    Tyler LITTIE Agar, RN Phone [phone]  08/30/2023, 12:17 PM  Clinical Narrative:                 TOC received message from nurse stating that MD wanting TOC to start the d/c process. MD has clarified stating that MD wants social needs to be addressed and that patient is a readmit.   Cm at bedside for high risk assessment. Patient states that he is from home with his wife where he functions modified independent. Patient states that he uses walker which was delivered on last admit. Patient states that he refused previous attempt to deliver Hospital bed because he did not want it in his living room.Patient currently has recommendation for [facility] and states that he is active with [facility] and would like to resume services. CM has confirmed with Angie at [facility] that services can resume at discharge. Patient states that he has no social issues and that his issue is totally pain related. Patient states that his pain is unbearable at times which makes it difficult to move around. Patient states that he would like to focus on his pain versus social issues.     Expected Discharge Plan: Home w Home Health Services Barriers to Discharge: Continued Medical Work up   Patient Goals and CMS Choice Patient states their goals for this hospitalization and ongoing recovery are:: For pain to be better in order for him to move around   Choice offered to / list presented to : Patient [place] ownership interest in [facility].provided to::  (n/a)    Expected Discharge Plan and Services In-house Referral: NA Discharge Planning Services: CM Consult Post Acute Care Choice: NA Living arrangements for the past 2 months: Single Family Home                 DME Arranged: N/A (Per patient he has walker and refused  hospital bed on last admit)         HH Arranged: PT, OT (Resume HH PT/ OT) HH Agency: Other - See comment (Active with [facility]) Date HH Agency Contacted: 08/30/23 Time HH Agency Contacted: 1213 Representative spoke with at [facility] Agency: Angie  Prior Living Arrangements/Services Living arrangements for the past 2 months: Single Family Home Lives with:: Spouse Patient language and need for interpreter reviewed:: Yes Do you feel safe going back to the place where you live?: Yes      Need for Family Participation in Patient Care: Yes (Comment) Care giver support system in place?: Yes (comment) Current home services: Home PT, Home OT (Active with [facility] wants to resume services) Criminal Activity/Legal Involvement Pertinent to Current Situation/Hospitalization: No - Comment as needed  Activities of Daily Living   ADL Screening (condition at time of admission) Independently performs ADLs?: No Does the patient have a NEW difficulty with bathing/dressing/toileting/self-feeding that is expected to last >3 days?: No Does the patient have a NEW difficulty with getting in/out of bed, walking, or climbing stairs that is expected to last >3 days?: No Does the patient have a NEW difficulty with communication that is expected to last >3 days?: No Is the patient deaf or have difficulty hearing?: No Does the patient have difficulty seeing, even when wearing glasses/contacts?: No Does the patient have difficulty concentrating, remembering, or making decisions?: No  Permission Sought/Granted Permission sought to share information with : Family Supports Permission granted to share information with : Yes, Verbal Permission Granted  Share Information with NAME: Tyler Scott     Permission granted to share info w Relationship: spouse  Permission granted to share info w Contact Information: [phone]  Emotional Assessment Appearance:: Appears older than stated age Attitude/Demeanor/Rapport:  Engaged Affect (typically observed): Pleasant, Appropriate Orientation: : Oriented to Place, Oriented to Self, Oriented to  Time, Oriented to Situation Alcohol / Substance Use: Not Applicable Psych Involvement: No (comment)  Admission diagnosis:  Lymphoma, unspecified body region, unspecified lymphoma type (HCC) [C85.90] Cancer-related breakthrough pain [G89.3] Patient Active Problem List   Diagnosis Date Noted   Need for emotional support 08/29/2023   Palliative care encounter 08/28/2023   High risk medication use 08/28/2023   Medication management 08/28/2023   Counseling and coordination of care 08/28/2023   Cancer associated pain 08/28/2023   Cancer-related breakthrough pain 08/27/2023   Normocytic anemia 08/27/2023   Lymphoma, low grade (HCC) 08/27/2023   Hyponatremia 08/27/2023   Chronic ITP (idiopathic thrombocytopenia) (HCC) 08/11/2023   Insulin  dependent type 2 diabetes mellitus (HCC) 08/06/2023   AKI (acute kidney injury) (HCC) 08/06/2023   Pancytopenia (HCC) 08/06/2023   Controlled type 2 diabetes mellitus with retinopathy, with Scott-term current use of insulin  (HCC) 09/14/2016   Hx of adenomatous polyp of colon 05/24/2014   Family history of colon cancer - brother 65 05/18/2014   Hyperkalemia 01/21/2013   Splenomegaly 01/21/2013   CLL (chronic lymphocytic leukemia) (HCC)    Type 1 diabetes mellitus (HCC) 12/29/2007   Hyperlipidemia 12/30/2006   Essential hypertension 12/30/2006   ERECTILE DYSFUNCTION, ORGANIC 12/30/2006   LOW BACK PAIN, ACUTE 12/21/2006   PCP:  Tyler Bruckner, MD Pharmacy:   [facility] [phone] - [place], [place] - [address] AT CORNER OF [facility] [address]   [address] Phone: [phone] Fax: [phone]  Tyler Scott - [facility] [address] [place] [address] Phone: [phone] Fax: [phone]  [facility] - [place], [place] - [phone] [place] [address] Phone: [phone] Fax: [phone]     Social Drivers of Health (SDOH) Social History: SDOH Screenings   Food Insecurity: No Food Insecurity (08/27/2023)  Housing: Low Risk  (08/27/2023)  Transportation Needs: No Transportation Needs (08/27/2023)  Utilities: Not At Risk (08/27/2023)  Social Connections: Moderately Isolated (08/27/2023)  Tobacco Use: Low Risk  (08/27/2023)   SDOH Interventions:     Readmission Risk Interventions    08/30/2023   11:40 AM 08/07/2023    8:49 AM  Readmission Risk Prevention Plan  Transportation Screening Complete Complete  PCP or Specialist Appt within 5-7 Days  Complete  PCP or Specialist Appt within 3-5 Days Complete   Home Care Screening  Complete  Medication Review (RN CM)  Complete  HRI or Home Care Consult Complete   Social Work Consult for Recovery Care Planning/Counseling Complete   Palliative Care Screening Complete   Medication Review Oceanographer) Complete

## 2023-08-30 NOTE — Progress Notes (Signed)
 Mobility Specialist - Progress Note   08/30/23 1001  Mobility  Activity Transferred from bed to chair  Level of Assistance Contact guard assist, steadying assist  Assistive Device Front wheel walker  Distance Ambulated (ft) 5 ft  Activity Response Tolerated well  Mobility Referral Yes  Mobility visit 1 Mobility  Mobility Specialist Start Time (ACUTE ONLY) [id number]  Mobility Specialist Stop Time (ACUTE ONLY) 1001  Mobility Specialist Time Calculation (min) (ACUTE ONLY) 13 min   Pt received in bed and agreeable to transfer to the recliner. No complaints during session. Pt to recliner after session with all needs met.    [facility]

## 2023-08-30 NOTE — Progress Notes (Signed)
 Progress Note   Patient: Tyler Scott FMW:[id number] DOB: 11-Jul-1955 DOA: 08/27/2023     2 DOS: the patient was seen and examined on 08/30/2023   Brief hospital course:  Tyler Scott is a 68 y.o. male with medical history significant of HTN, HLD, T2DM, CLL, chronic ITP who presented to ED with intractable back pain leg pain. He was given oral oxycodone  and it's not touching the pain. He states his back pain is throbbing. His weakness in his right leg is worse and is needing assistance for standing. This has not changed from hospital d/c. He has no urinary incontinence or saddle paraesthesias.     Admitted 08/06/23-08/18/23 for low back pain. Had retroperitoneal LN biopsy on 08/09/23. Pathology showed low grade lymphoma. Needed 1 unit PRBC during this stay. Chemotherapy regimen with Bendamustine /rituximab  orders placed to start as outpatient along with weekly Nplate . He had MRI of the lumbar spine done on 08/06/2023 which showed diffuse marrow disease likely due to lymphoma and repeat MRI on 08/28/2023 showed similar finding but slightly worsening.  Patient has been under the care of oncologist and pain medication likely need to be upgraded.  He will be continued in the hospital with pain medications.  Due to his intractable pain he meets inpatient level of care requiring IV pain medications.  Palliative care consultation was done regarding pain management and appreciate Dr. Gaetana  recommendation.  Assessment and Plan:   Cancer-related breakthrough pain 68 year old male with known CLL>lymphoma who presented to Ed with complaints of intractable back pain despite his home oral pain medication.  -He has been changed to inpatient due to intractable pain requiring IV pain medications -just finished steroid taper early this week  -give prednisone  40mg  now  -muscle relaxer PRN  -Mri spine below. He can not move his RLL against gravity, but can not really tell me if this is new since hospital d/c.  He has no urinary incontinence or saddle paraesthesias. Check MRI lumbar spine w/wo contrast.  -MRI lumbar spine 08/06/23: Masslike extraosseous paraspinal signal abnormality at multiple levels, and L4 and subtle, equivacal signal in the ventral canal in the lower lumbar canal and sacral canal could potentially represent extraosseous extension of tumor but is incompletely assessed without contrast. If the patient is able, consider postcontrast imaging. -needs adjustment in pain control.  - Increased MS Contin  15 mg every 8 hours daily, Dilaudid  2 milligram p.o. every 4 hours as needed, and gabapentin  increased to 100 twice daily by palliative care. -palliative care consult for pain management appreciated. - Palliative care is adjusting pain medications.  I hope you will have a stable pain control in the next 1 to 2 days. -Patient to follow-up with oncologist as outpatient as patient has been scheduled for chemotherapy.   Lymphoma, low grade (HCC)  -Biopsy of RP LN 6/30, prelim path shows Low grade Lymphoma consistent with CLL/SLL.   -plans for palliative chemo, but has not started since discharged from hospital on 08/18/23.  -If she does not get better by tomorrow we may need to consult oncology.   CLL (chronic lymphocytic leukemia) (HCC) -- Diagnosed CLL in 2012 -- status post several previous therapies including Bendamustine  and rituximab , Ibrutinib , and most recently on Calquence  since 2023.  -- Patient was supposed to be on Gazyva , venetoclax . He had many interruped cycles of Gazyva  due to No shows.  He was last seen in outpatient oncology on 07/06/23 at which time he was scheduled for C4D1 Gazyva  however was HELD  due to infection/acute left sided epidydimo-orchitis for which he was on antibiotics.    Normocytic anemia Hgb of 7.4 today Required 1 PRBC during recent hospital stay Type and screen and transfuse if hgb <7, today's hemoglobin is 8.8   Essential hypertension Continue norvasc  10mg   daily    Controlled type 2 diabetes mellitus with retinopathy, with long-term current use of insulin  (HCC) A1C of 5.8 in June of 2025  Continue long acting insulin   SSI and accuchecks QAC/HS    Hyperlipidemia Continue zocor  20mg  daily    Chronic ITP (idiopathic thrombocytopenia) (HCC) Platelets at baseline  No VTE prophylaxis  Continue to trend    Hyponatremia May be malignancy related At baseline Monitor    Deconditioning: PT/OT consult done       Subjective: Still complains of pain  Physical Exam: Vitals:   08/29/23 1312 08/29/23 2044 08/30/23 0502 08/30/23 0824  BP: (!) 125/51 (!) 159/73 128/65 (!) 115/56  Pulse: (!) 101 88 91   Resp: 16 16 16    Temp: 98.6 F (37 C) 97.7 F (36.5 C) (!) 97.5 F (36.4 C)   TempSrc: Oral Oral Oral   SpO2: 99% 100% 96%   Weight:      Height:       Constitutional: Alert, awake, calm, comfortable HEENT: Neck supple Respiratory: clear to auscultation bilaterally, no wheezing, no crackles. Normal respiratory effort. No accessory muscle use.  Cardiovascular: Regular rate and rhythm, no murmurs / rubs / gallops. No extremity edema. 2+ pedal pulses. No carotid bruits.  Abdomen: no tenderness, no masses palpated. No hepatosplenomegaly. Bowel sounds positive.  Musculoskeletal: no clubbing / cyanosis. No joint deformity upper and lower extremities. Good ROM, no contractures. Normal muscle tone.  Skin: no rashes, lesions, ulcers. No induration Neurologic: CN 2-12 grossly intact. Sensation intact, DTR normal. Strength 5/5 x all 4 extremities.  Psychiatric: Normal judgment and insight. Alert and oriented x 3. Normal mood.   Data Reviewed:  Sodium 130, hemoglobin 7.5 hematocrit 23.5  Family Communication: No family member was available at bedside today  Disposition: Status is: Inpatient Remains inpatient appropriate because: Ongoing pain medication adjustment by palliative care  Planned Discharge Destination: Home with Home  Health    Time spent: 35 minutes  Author: Nena Rebel, MD 08/30/2023 12:56 PM  For on call review [email or web address].

## 2023-08-31 DIAGNOSIS — G893 Neoplasm related pain (acute) (chronic): Secondary | ICD-10-CM

## 2023-08-31 DIAGNOSIS — C9110 Chronic lymphocytic leukemia of B-cell type not having achieved remission: Secondary | ICD-10-CM | POA: Diagnosis not present

## 2023-08-31 DIAGNOSIS — D649 Anemia, unspecified: Secondary | ICD-10-CM | POA: Diagnosis not present

## 2023-08-31 DIAGNOSIS — D696 Thrombocytopenia, unspecified: Secondary | ICD-10-CM

## 2023-08-31 LAB — GLUCOSE, CAPILLARY
Glucose-Capillary: 224 mg/dL — ABNORMAL HIGH (ref 70–99)
Glucose-Capillary: 320 mg/dL — ABNORMAL HIGH (ref 70–99)
Glucose-Capillary: 225 mg/dL — ABNORMAL HIGH (ref 70–99)
Glucose-Capillary: 233 mg/dL — ABNORMAL HIGH (ref 70–99)

## 2023-08-31 LAB — PREPARE RBC (CROSSMATCH)

## 2023-08-31 MED ORDER — HYDROMORPHONE HCL 2 MG PO TABS
2.0000 mg | ORAL_TABLET | ORAL | Status: DC | PRN
  Administered 2023-08-31 – 2023-09-02 (×9): 4 mg via ORAL
  Filled 2023-08-31 (×9): qty 2

## 2023-08-31 MED ORDER — HYDROMORPHONE HCL 1 MG/ML IJ SOLN
0.5000 mg | Freq: Once | INTRAMUSCULAR | Status: AC
  Administered 2023-08-31: 0.5 mg via INTRAVENOUS
  Filled 2023-08-31: qty 0.5

## 2023-08-31 MED ORDER — SODIUM CHLORIDE 0.9% IV SOLUTION: Freq: Once | INTRAVENOUS | Status: AC

## 2023-08-31 NOTE — Plan of Care (Signed)
   Problem: Education: Goal: Ability to describe self-care measures that may prevent or decrease complications (Diabetes Survival Skills Education) will improve Outcome: Progressing Goal: Individualized Educational Video(s) Outcome: Progressing   Problem: Coping: Goal: Ability to adjust to condition or change in health will improve Outcome: Progressing

## 2023-08-31 NOTE — Progress Notes (Cosign Needed)
 Tyler Scott   DOB:02-05-1956   FM#:[id number]      ASSESSMENT & PLAN:  Tyler Scott is a 68 year old male patient with oncologic history significant for CLL. He was admitted on 08/27/23 with complaints of back pain and leg pain.  Back and Leg pain -- patient admitted on 7/18 with complaints of pain, continues to complain ongoing throbbing pain, states relief from pain meds is temporary. -- Continue judicious pain meds regimen as ordered -- Appreciate Palliative team who are managing   History of CLL/SLL Low grade Lymphoma -new dx Lymphadenopathy -- Diagnosed CLL in 2012 -- status post several previous therapies including Bendamustine  and rituximab , Ibrutinib , and most recently on Calquence  since 2023.  -- Patient was supposed to be on Gazyva , venetoclax . He had many interruped cycles of Gazyva  due to No shows.  He was last seen in outpatient oncology on 07/06/23 at which time he was scheduled for C4D1 Gazyva  however was HELD due to infection/acute left sided epidydimo-orchitis for which he was on antibiotics.  -- Imaging done 6/27 shows progressive lymphadenopathy, may be CLL progression vs large cell transformation. -- Biopsy of RP LN 6/30, prelim path shows Low grade Lymphoma consistent with CLL/SLL.   -- Prednisone  taper dosing started at last admission and just completed. -- Chemotherapy regimen with Bendamustine /rituximab  to be started as outpatient along with weekly Nplate .  Patient has appointment for 7/23 in the cancer center. - Discussed with patient that this chemotherapy cannot be given as inpatient per his request.  Okay with discharge after PRBC transfusion today and to follow-up tomorrow 7/23 in the cancer center. - Medical Oncology/Dr. Onesimo following.     Anemia --Hemoglobin low 7.5-ordered 1 unit PRBC transfusion to be given today prior to discharge.  Discussed with patient rationale for transfusion. -- Has received PRBC transfusions in the past -- Recommend  PRBC transfusion for HGB<7.0 -- Monitor CBC with differential   Thrombocytopenia --Platelets low 66K today  - Recommend transfusion for counts <20K or <50K with bleeding. --Continue to monitor CBC with differential -- Status post IVIG 1g/kg x 2 doses, last given 08/12/23  - Patient for Nplate  weekly as outpatient.   AKI -- Creatinine has normalized, BUN slightly elevated - Avoid nephrotoxic agents -- Continue to monitor CMP    Code Status Full   Subjective:  Patient seen awake and alert sitting up in bed.  States ongoing pain in his leg and back with short-term relief when he takes pain meds.  Patient requesting receiving chemotherapy during this admission, explained to patient this cannot be done.  Also explained to patient that he will get 1 unit of blood prior to discharge today.  Patient concerned about transportation home later today and transportation back to the cancer center tomorrow for his treatment.  Nurse will discuss with patient's wife his transportation concerns.  Objective:   Intake/Output Summary (Last 24 hours) at 08/31/2023 0955 Last data filed at 08/30/2023 2103 Gross per 24 hour  Intake --  Output 300 ml  Net -300 ml     PHYSICAL EXAMINATION: ECOG PERFORMANCE STATUS: 3 - Symptomatic, >50% confined to bed  Vitals:   08/30/23 2111 08/31/23 0540  BP: (!) 134/97 (!) 141/69  Pulse: (!) 101 97  Resp: 18 18  Temp: 98.2 F (36.8 C) 98.4 F (36.9 C)  SpO2: 97% 98%   Filed Weights   08/27/23 1609 08/27/23 2142  Weight: 140 lb (63.5 kg) 138 lb 3.7 oz (62.7 kg)    GENERAL:  alert, no distress and comfortable +thin-appearing SKIN: +Pale skin color, texture, turgor are normal, no rashes or significant lesions EYES: normal, conjunctiva are pink and non-injected, sclera clear OROPHARYNX: no exudate, no erythema and lips, buccal mucosa, and tongue normal  NECK: supple, thyroid  normal size, non-tender, without nodularity LYMPH: no palpable lymphadenopathy in the  cervical, axillary or inguinal LUNGS: clear to auscultation and percussion with normal breathing effort HEART: regular rate & rhythm and no murmurs and no lower extremity edema ABDOMEN: abdomen soft, non-tender and normal bowel sounds MUSCULOSKELETAL: no cyanosis of digits and no clubbing  PSYCH: alert & oriented x 3 with fluent speech    All questions were answered. The patient knows to call the clinic with any problems, questions or concerns.   The total time spent in the appointment was 40 minutes encounter with patient including review of chart and various tests results, discussions about plan of care and coordination of care plan  [name], NP 08/31/2023 9:55 AM    Labs Reviewed:  Lab Results  Component Value Date   WBC 9.8 08/30/2023   HGB 7.5 (L) 08/30/2023   HCT 23.5 (L) 08/30/2023   MCV 97.1 08/30/2023   PLT 66 (L) 08/30/2023   Recent Labs    08/11/23 1419 08/13/23 0428 08/24/23 1329 08/27/23 1718 08/28/23 1706 08/29/23 1012 08/30/23 0603  NA 132*   < > 136   < > 132* 131* 130*  K 4.1   < > 4.6   < > 4.5 4.7 4.2  CL 98   < > 105   < > 94* 95* 94*  CO2 23   < > 27   < > 23 26 26   GLUCOSE 199*   < > 168*   < > 234* 229* 184*  BUN 20   < > 24*   < > 36* 32* 27*  CREATININE 0.97   < > 0.89   < > 1.29* 1.02 1.12  CALCIUM 9.8   < > 8.9   < > 9.5 9.5 9.4  GFRNONAA >60   < > >60   < > >60 >60 >60  PROT 7.3  --  7.3  --  7.8  --   --   ALBUMIN 3.7  --  3.8  --  3.6  --   --   AST 17  --  17  --  32  --   --   ALT 8  --  11  --  18  --   --   ALKPHOS 71  --  72  --  117  --   --   BILITOT 1.4*  --  0.6  --  0.8  --   --    < > = values in this interval not displayed.    Studies Reviewed:  MR Lumbar Spine W Wo Contrast Result Date: 08/28/2023 CLINICAL DATA:  68 year old male with diffusely abnormal skeletal marrow, CLL, bulky retroperitoneal mass biopsied with CT guidance last month, pathology revealing small lymphocytic lymphoma. Back and leg pain. EXAM: MRI  LUMBAR SPINE WITHOUT AND WITH CONTRAST TECHNIQUE: Multiplanar and multiecho pulse sequences of the lumbar spine were obtained without and with intravenous contrast. CONTRAST:  6mL GADAVIST  GADOBUTROL  1 MMOL/ML IV SOLN COMPARISON:  Lumbar MRI and CT Abdomen and Pelvis 08/06/2023. FINDINGS: Segmentation: Normal on the comparison CT, the same numbering system used on the MRI last month. Alignment:  Maintained lumbar lordosis. Vertebrae: Diffusely abnormal marrow signal throughout all visible skeletal structures  from the lower chest through the pelvis. And indistinct margins of the bony cortex throughout, more consistent with infiltrative tumor than red marrow replacement. Maximal marrow heterogeneity is in the central sacrum series 7, image 8, stable from last month. Pronounced marrow heterogeneity also in the visible iliac wings. No pathologic fracture in the visible lumbar spine or sacrum. Conus medullaris and cauda equina: Conus extends to the L1 level. No lower spinal cord or conus signal abnormality. Enhancement. No abnormal dural thickening. Cauda equina nerve roots appear normal. No abnormal intradural Paraspinal and other soft tissues: Bulky and severe abdominal, retroperitoneal, prevertebral, and pelvic lymphadenopathy. Appearance not significantly changed from a CT Abdomen and Pelvis last month. Otherwise negative paraspinal soft tissues. Disc levels: Although subtle evidence of extraosseous tumor at some levels (e.g. L3 series 9, image 21). There is no malignant or degenerative lumbar spinal stenosis. No malignant neural foraminal stenosis. IMPRESSION: 1. Diffusely infiltrated marrow of the visible skeletal structures, most consistent with a diffuse skeletal lymphoma in this setting. No pathologic fracture. 2. Subtle evidence of early extraosseous extension of disease at some levels. But no malignant spinal stenosis or neural impingement. 3. Bulky and severe lymphadenopathy throughout the visible abdomen and  pelvis, not significantly changed from CT last month. Electronically Signed   By: VEAR Hurst M.D.   On: 08/28/2023 12:33   DG Pelvis 1-2 Views Result Date: 08/27/2023 CLINICAL DATA:  Low back pain EXAM: PELVIS - 1-2 VIEW COMPARISON:  None Available. FINDINGS: Right femur is incompletely included. Mild femoroacetabular degenerative change. Pubic symphysis and rami appear intact. No definitive fracture. IMPRESSION: Mild degenerative change. Note that the right femur/trochanter is incompletely included, follow-up dedicated right hip radiographs may be obtained if desired. Electronically Signed   By: Luke Bun M.D.   On: 08/27/2023 18:23   DG Lumbar Spine Complete Result Date: 08/27/2023 CLINICAL DATA:  Lower back pain radiating to the right side EXAM: LUMBAR SPINE - COMPLETE 5 VIEW COMPARISON:  CT abdomen and pelvis dated 08/06/2023 FINDINGS: There is no evidence of lumbar spine fracture. Alignment is normal. Degenerative changes of the lumbar spine, most pronounced at L4-S1, where there is likely neural foraminal narrowing. IMPRESSION: Degenerative changes of the lumbar spine, most pronounced at L4-S1, where there is likely neural foraminal narrowing. Electronically Signed   By: Limin  Xu M.D.   On: 08/27/2023 18:23   CT BIOPSY Result Date: 08/09/2023 CLINICAL DATA:  Progressive retroperitoneal adenopathy, history of CLL EXAM: CT GUIDED CORE BIOPSY OF LEFT PARA-AORTIC ADENOPATHY ANESTHESIA/SEDATION: Intravenous Fentanyl  200mcg and Versed  4mg  were administered by RN during a total moderate (conscious) sedation time of 25 minutes; the patient's level of consciousness and physiological / cardiorespiratory status were monitored continuously by radiology RN under my direct supervision. PROCEDURE: The procedure risks, benefits, and alternatives were explained to the patient. Questions regarding the procedure were encouraged and answered. The patient understands and consents to the procedure. patient placed prone.  select axial scans through the abdomen obtained, adenopathy localized, and an appropriate skin entry site determined and marked. The operative field was prepped with chlorhexidinein a sterile fashion, and a sterile drape was applied covering the operative field. A sterile gown and sterile gloves were used for the procedure. Local anesthesia was provided with 1% Lidocaine . Under CT fluoroscopic guidance, a 17 gauge trocar needle was advanced to the margin of the lesion. Once needle tip position was confirmed, coaxial 18-gauge core biopsy samples were obtained, submitted in saline to surgical pathology. The guide needle was removed.  Postprocedure scans show no hemorrhage or other apparent complication. The patient tolerated the procedure well. RADIATION DOSE REDUCTION: This exam was performed according to the departmental dose-optimization program which includes automated exposure control, adjustment of the mA and/or kV according to patient size and/or use of iterative reconstruction technique. COMPLICATIONS: None immediate FINDINGS: Bulky left para-aortic and aortocaval adenopathy localized. Representative core biopsy samples obtained as above. IMPRESSION: 1. Technically successful CT-guided core biopsy, left para-aortic retroperitoneal adenopathy. Electronically Signed   By: JONETTA Faes M.D.   On: 08/09/2023 15:52   ECHOCARDIOGRAM COMPLETE Result Date: 08/07/2023    ECHOCARDIOGRAM REPORT   Patient Name:   Tyler Scott Date of Exam: 08/07/2023 Medical Rec #:  [id number]           Height:       68.0 in Accession #:    [id number]          Weight:       145.0 lb Date of Birth:  [birth year]          BSA:          1.783 m Patient Age:    67 years            BP:           136/70 mmHg Patient Gender: M                   HR:           100 bpm. Exam Location:  Inpatient Procedure: 2D Echo, Cardiac Doppler and Color Doppler (Both Spectral and Color            Flow Doppler were utilized during procedure). Indications:     Chemo  History:        Patient has no prior history of Echocardiogram examinations.                 Risk Factors:Diabetes.  Sonographer:    Benard Stallion Referring Phys: [id number] PETER R ENNEVER IMPRESSIONS  1. Left ventricular ejection fraction, by estimation, is 60 to 65%. The left ventricle has normal function. The left ventricle has no regional wall motion abnormalities. There is mild left ventricular hypertrophy. Left ventricular diastolic parameters are consistent with Grade I diastolic dysfunction (impaired relaxation).  2. Right ventricular systolic function is normal. The right ventricular size is normal.  3. The mitral valve is normal in structure. No evidence of mitral valve regurgitation. No evidence of mitral stenosis.  4. The tricuspid valve is abnormal. Tricuspid valve regurgitation is mild to moderate.  5. The aortic valve is tricuspid. Aortic valve regurgitation is not visualized. No aortic stenosis is present. FINDINGS  Left Ventricle: Left ventricular ejection fraction, by estimation, is 60 to 65%. The left ventricle has normal function. The left ventricle has no regional wall motion abnormalities. The left ventricular internal cavity size was normal in size. There is  mild left ventricular hypertrophy. Left ventricular diastolic parameters are consistent with Grade I diastolic dysfunction (impaired relaxation). Normal left ventricular filling pressure. Right Ventricle: IVC not visualized, not able to estimate PASP. The right ventricular size is normal. Right vetricular wall thickness was not well visualized. Right ventricular systolic function is normal. Left Atrium: Left atrial size was normal in size. Right Atrium: Right atrial size was normal in size. Pericardium: There is no evidence of pericardial effusion. Mitral Valve: The mitral valve is normal in structure. There is mild thickening of the mitral valve leaflet(s). There is mild calcification of the  mitral valve leaflet(s). Mild mitral  annular calcification. No evidence of mitral valve regurgitation. No evidence of mitral valve stenosis. Tricuspid Valve: The tricuspid valve is abnormal. Tricuspid valve regurgitation is mild to moderate. No evidence of tricuspid stenosis. Aortic Valve: The aortic valve is tricuspid. Aortic valve regurgitation is not visualized. No aortic stenosis is present. Aortic valve mean gradient measures 4.0 mmHg. Aortic valve peak gradient measures 7.8 mmHg. Aortic valve area, by VTI measures 3.18 cm. Pulmonic Valve: The pulmonic valve was not well visualized. Pulmonic valve regurgitation is not visualized. No evidence of pulmonic stenosis. Aorta: The aortic root and ascending aorta are structurally normal, with no evidence of dilitation. IAS/Shunts: The interatrial septum was not well visualized.  LEFT VENTRICLE PLAX 2D LVIDd:         4.20 cm   Diastology LVIDs:         2.60 cm   LV e' medial:    7.29 cm/s LV PW:         1.10 cm   LV E/e' medial:  12.9 LV IVS:        1.10 cm   LV e' lateral:   7.51 cm/s LVOT diam:     2.20 cm   LV E/e' lateral: 12.5 LV SV:         74 LV SV Index:   41 LVOT Area:     3.80 cm  RIGHT VENTRICLE RV Basal diam:  3.55 cm RV Mid diam:    3.30 cm RV S prime:     14.40 cm/s TAPSE (M-mode): 3.0 cm LEFT ATRIUM             Index        RIGHT ATRIUM           Index LA diam:        3.20 cm 1.80 cm/m   RA Area:     13.40 cm LA Vol (A2C):   52.1 ml 29.23 ml/m  RA Volume:   34.40 ml  19.30 ml/m LA Vol (A4C):   28.9 ml 16.21 ml/m LA Biplane Vol: 38.6 ml 21.65 ml/m  AORTIC VALVE AV Area (Vmax):    3.18 cm AV Area (Vmean):   3.22 cm AV Area (VTI):     3.18 cm AV Vmax:           140.00 cm/s AV Vmean:          93.700 cm/s AV VTI:            0.232 m AV Peak Grad:      7.8 mmHg AV Mean Grad:      4.0 mmHg LVOT Vmax:         117.00 cm/s LVOT Vmean:        79.300 cm/s LVOT VTI:          0.194 m LVOT/AV VTI ratio: 0.84  AORTA Ao Root diam: 3.40 cm Ao Asc diam:  3.00 cm MITRAL VALVE                TRICUSPID  VALVE MV Area (PHT): 3.99 cm     TR Peak grad:   48.2 mmHg MV Decel Time: 190 msec     TR Vmax:        347.00 cm/s MV E velocity: 93.80 cm/s MV A velocity: 119.00 cm/s  SHUNTS MV E/A ratio:  0.79         Systemic VTI:  0.19 m  Systemic Diam: 2.20 cm Dorn Ross MD Electronically signed by Dorn Ross MD Signature Date/Time: 08/07/2023/12:33:53 PM    Final    CT CHEST WO CONTRAST Result Date: 08/07/2023 CLINICAL DATA:  History of CLL.  Hematologic malignancy staging. EXAM: CT CHEST WITHOUT CONTRAST TECHNIQUE: Multidetector CT imaging of the chest was performed following the standard protocol without IV contrast. RADIATION DOSE REDUCTION: This exam was performed according to the departmental dose-optimization program which includes automated exposure control, adjustment of the mA and/or kV according to patient size and/or use of iterative reconstruction technique. COMPARISON:  CT abdomen and pelvis 08/06/2023.  PET-CT 03/05/2023 FINDINGS: Cardiovascular: Heart size is normal without significant pericardial fluid. Coronary artery calcifications. Atherosclerotic calcifications in thoracic aorta without aneurysm. Mediastinum/Nodes: Compared to the PET-CT from 03/05/2023, there has been enlargement lymph nodes throughout the chest. Nodal tissue in the left supraclavicular area adjacent to the left thyroid  lobe measures 3.2 cm in the short axis on image 17/2 and previously measured 2.7 cm in the short axis. Prominent nodal tissue along the right side of the mediastinum on image 38/2 measures 3.2 cm in the short axis and previously measured 2.9 cm. Index left axillary lymph node on image 36/2 measures 2.6 cm in the short axis previously measured 2.0 cm. This new or increased soft tissue along the posterior mediastinum and paraspinal region on image 88/2 that measures 2.2 cm in the short axis. There is also soft tissue along the right side of the posterior mediastinum and paraspinal  region. Enlarged lymph nodes in the cardiophrenic fat. Enlarged right internal mammary lymph nodes. Lungs/Pleura: Trachea and mainstem bronchi are patent. Trace left pleural fluid with new pleural-based thickening particularly along the paraspinal regions. Volume loss in the posterior lower lobes particularly on the left side. Subtle ground-glass opacities along the anterior right lung adjacent to the right internal mammary chain are nonspecific but favor atelectasis. No suspicious pulmonary nodules. Upper Abdomen: Extensive lymphadenopathy in the upper abdomen. Spleen appears to be enlarged. Musculoskeletal: No acute bone abnormality. No suspicious osseous lesions. IMPRESSION: 1. Progression of the lymph node enlargement throughout the chest compared to the PET-CT from 03/05/2023. Diffuse lymphadenopathy throughout the chest as described. 2. Markedly increased nodal tissue and soft tissue thickening in the posterior mediastinum and paraspinal regions. 3. Volume loss in the posterior lower lobes particularly on the left side. Trace left pleural fluid. 4.  Aortic Atherosclerosis (ICD10-I70.0). Electronically Signed   By: Juliene Balder M.D.   On: 08/07/2023 09:28   MR LUMBAR SPINE WO CONTRAST Result Date: 08/06/2023 CLINICAL DATA:  Myelopathy, acute, lumbar spine. EXAM: MRI LUMBAR SPINE WITHOUT CONTRAST TECHNIQUE: Multiplanar, multisequence MR imaging of the lumbar spine was performed. No intravenous contrast was administered. COMPARISON:  None Available. FINDINGS: Segmentation:  Standard. Alignment:  No substantial sagittal subluxation. Vertebrae: Diffusely low T1 signal throughout the bone marrow with heterogeneous signal in the sacral vertebral bodies. No evidence of acute fracture or discitis/osteomyelitis. Conus medullaris and cauda equina: Conus extends to the L1-L2 level. Conus and cauda equina appear normal. Paraspinal and other soft tissues: There appears to be intermediate extraosseous signal surrounding the  vertebral bodies at multiple levels (for example see series 9, image 28 and series 9, image 20), possibly extraosseous extension of tumor but not well assessed without contrast. Please see same day CT of the abdomen/pelvis for description of retroperitoneal nodal mass and other intra-abdominal intrapelvic findings. Disc levels: T12-L1: No significant disc protrusion, foraminal stenosis, or canal stenosis. L1-L2: No significant disc protrusion, foraminal  stenosis, or canal stenosis. L2-L3: No significant disc protrusion, foraminal stenosis, or canal stenosis. L3-L4: No significant disc protrusion, foraminal stenosis, or canal stenosis. L4-L5: Mild disc bulge and endplate spurring without significant stenosis. L5-S1: Question subtle intermediate signal along the posterior aspect of the L5 vertebral body which partially effaces ventral CSF. Mild disc bulging at the disc level without significant canal or foraminal stenosis. Sacral canal: Question mild intermediate signal in the ventral canal without compressive stenosis. IMPRESSION: 1. Diffusely abnormal marrow signal, likely related to the patient's underlying lymphoma. 2. Masslike extraosseous paraspinal signal abnormality at multiple levels, and L4 and subtle, equivacal signal in the ventral canal in the lower lumbar canal and sacral canal could potentially represent extraosseous extension of tumor but is incompletely assessed without contrast. If the patient is able, consider postcontrast imaging. 3. No compressive canal or foraminal stenosis. Electronically Signed   By: Gilmore GORMAN Molt M.D.   On: 08/06/2023 19:37   CT ABDOMEN PELVIS WO CONTRAST Result Date: 08/06/2023 CLINICAL DATA:  Chronic lymphocytic leukemia. Acute abdominal pain. * Tracking Code: BO * EXAM: CT ABDOMEN AND PELVIS WITHOUT CONTRAST TECHNIQUE: Multidetector CT imaging of the abdomen and pelvis was performed following the standard protocol without IV contrast. RADIATION DOSE REDUCTION: This  exam was performed according to the departmental dose-optimization program which includes automated exposure control, adjustment of the mA and/or kV according to patient size and/or use of iterative reconstruction technique. COMPARISON:  PET-CT 03/05/2023 FINDINGS: Lower chest: Thickened paraspinal tissue in the posterior mediastinum (image 8/2). Findings consistent lymphoma. Hepatobiliary: No focal hepatic lesion. Normal gallbladder. No biliary duct dilatation. Common bile duct is normal. Pancreas: Pancreas is normal. No ductal dilatation. No pancreatic inflammation. Spleen: Spleen is mildly enlarged at 14. Cm in craniocaudad dimension Adrenals/urinary tract: Adrenal glands and kidneys are normal. The ureters and bladder normal. Stomach/Bowel: Stomach, small bowel, appendix, and cecum are normal. The colon and rectosigmoid colon are normal. Vascular/Lymphatic: Bulky retroperitoneal lymphadenopathy increased from comparison exam. Retroperitoneal nodal mass at the level of the lower pole LEFT kidney LEFT aorta measures 7.4 cm (image 46/2) compared to 5.1 cm comparison PET-CT scan. The aorta is elevated by 19 mm by retroperitoneal adenopathy compared to 8 mm on prior. Example node ventral to the aorta measuring 33 mm on image 52 compares to 23 mm. Bulky adenopathy extends along the iliac chains. LEFT external iliac lymph node anterior to the operator space measuring 22 mm compared to 13 mm. Minimal inguinal adenopathy. Reproductive: Prostate unremarkable Other: No free fluid. Musculoskeletal: No aggressive osseous lesion. Posterior fusion of the L4 spinous processes IMPRESSION: 1. Progression of lymphoma lymphadenopathy with increase in bulky retroperitoneal nodal mass. 2. Increase in perispinal thickening in the posterior mediastinum. 3. Mild splenomegaly. 4. No bowel obstruction. Electronically Signed   By: Jackquline Boxer M.D.   On: 08/06/2023 18:20   ADDENDUM  .Patient was Personally and independently  interviewed, examined and relevant elements of the history of present illness were reviewed in details and an assessment and plan was created. All elements of the patient's history of present illness , assessment and plan were discussed in details with Jordyn Hofacker NP. The above documentation reflects our combined findings assessment and plan.   Gautam Kale MD MS

## 2023-08-31 NOTE — Progress Notes (Signed)
 PT Cancellation Note  Patient Details Name: Tyler Scott MRN: [id number] DOB: 06-01-1955   Cancelled Treatment:    Reason Eval/Treat Not Completed: Other (comment)  Pt declined due to tired and feeling loopy from meds.  He was up earlier with OT.  Will f/u as able.  Tyler Scott, PT [facility] [phone] ' Tyler Scott 08/31/2023, 5:30 PM

## 2023-08-31 NOTE — Plan of Care (Addendum)
 Patient educated various times about pain medications, plan of care, orders, and discharge disposition. Patient asked RN to repeat everything multiple times. This RN educated patient in detail about all medications and wrote all of the information, plan of care, and important lab results on white board. After attempting to educate patient, he requested RN to explain everything for the third time and became very frustrated saying : I dont understand or remember you saying any of this to me. Patient said his wife will be coming soon and I will explain everything to her including disposition upon her arrival.    1140-Patient is extremely anxious about going home today after his blood transfusion and asked me to suggest if he may get discharged tomorrow in the morning & go straight to the cancer center for txt. He states his pain is still uncontrolled at 8/10 and needs to stay another night. MD Notified. This RN attempted to call wife on cell phone for update and no answer.  Problem: Fluid Volume: Goal: Ability to maintain a balanced intake and output will improve Outcome: Progressing   Problem: Health Behavior/Discharge Planning: Goal: Ability to identify and utilize available resources and services will improve Outcome: Progressing Goal: Ability to manage health-related needs will improve Outcome: Progressing   Problem: Metabolic: Goal: Ability to maintain appropriate glucose levels will improve Outcome: Progressing   Problem: Nutritional: Goal: Maintenance of adequate nutrition will improve Outcome: Progressing Goal: Progress toward achieving an optimal weight will improve Outcome: Progressing   Problem: Skin Integrity: Goal: Risk for impaired skin integrity will decrease Outcome: Progressing

## 2023-08-31 NOTE — Progress Notes (Signed)
 Daily Progress Note   Patient Name: Tyler Scott       Date: 08/31/2023 DOB: 05/21/1955  Age: 68 y.o. MRN#: [id number] Attending Physician: [place], Eric J, DO Primary Care Physician: [name], MD Admit Date: 08/27/2023 Length of Stay: 3 days  Reason for Consultation/Follow-up: Pain control  Subjective:   CC: Patient noting continued pain.  Following up regarding pain management.  Subjective:  Reviewed documentation in EMR including hospitalist note.  At time of EMR review in past 24 hours patient has received as needed IV Dilaudid  1 mg x 2 doses and as needed oxycodone  10 mg x 5 doses.  Patient also on gabapentin . Pain medication use noted and discussed with patient, at present, he complains of uncontrolled pain. One dose of 0.5 mg IV Dilaudid  once, up titrate PO Dilaudid  to 2-4 mg PO Q 3 hours PRN. Discussed with nursing colleagues and [facility] MD, will reach out to oncology liaison regarding patient's next treatment scheduled at the cancer center for 7-23.   Brief life review performed, patient states he used to work in [employer] at [employer], he talked about his work having meaning and that it was fulfilling.      Answered all questions as able at that time.  Noted palliative medicine team continue to follow with patient's medical journey.  Discussed care with team including hospitalist, RN, to coordinate care.  Review of Systems Back pain Objective:   Vital Signs:  BP (!) 141/69 (BP Location: Right Arm)   Pulse 97   Temp 98.4 F (36.9 C) (Oral)   Resp 18   Ht 5' 8 (1.727 m)   Wt 62.7 kg   SpO2 98%   BMI 21.02 kg/m   Physical Exam: General: NAD, alert, pleasant Cardiovascular: RRR Respiratory: no increased work of breathing noted, not in respiratory distress Neuro: Awake, interactive Psych: Tangential at times  Assessment & Plan:   Assessment: Patient is a 68 year old male with a past medical history of hypertension, hyperlipidemia, type 2 diabetes  mellitus, chronic ITP, and chronic CLL with recent diagnosis of low-grade lymphoma who was admitted on 08/27/2023 for management of worsening pain in his back and leg.  Patient was recently hospitalized from 08/06/2023 - 08/18/2023 for low back pain which is when biopsy was performed showing low grade lymphoma.  Upon admission, patient receiving management for cancer related pain.  Patient undergoing further MRI imaging due to worsening weakness in right lower extremity.  Oncology consulted for recommendations.  Palliative medicine consulted for pain management.   Recommendations/Plan: # Symptom management Patient is receiving these palliative interventions for symptom management with an intent to improve quality of life.                 - Pain, in setting of recent diagnosis of CLL progressed to low-grade lymphoma                                 - MRI obtained 08/28/2023 reviewed.  Defer to oncology regarding cancer management. Will reach out to oncology NP colleague for additional help.                               as needed oral Dilaudid  2-4 mg every 3 hours as needed    MS Contin  15 mg every 8 hours scheduled.  Continue to monitor renal function in the setting.                                  -  Continue IV Dilaudid  to 1 mg every 3 hours as needed breakthrough after oral opioids, one dose of 0.5 mg IV Dilaudid  once.                                 gabapentin  to 100 mg twice daily                 -Constipation                               - Continue MiraLAX  17 g daily             -                               - Continue senna 1 tab twice daily                               Continue to monitor bowel movements and adjust medications based on response.   # Psycho-social/Spiritual Support:  - Support System: Wife   # Discharge Planning:  To Be Determined  Discussed with: Patient, hospitalist, RN,    Thank you for allowing the palliative care team to participate in the care Tyler Scott.  Mod MDM Lonia Serve MD.  Palliative Care Provider PMT # [phone]  If patient remains symptomatic despite maximum doses, please call PMT at [phone] between 0700 and 1900. Outside of these hours, please call attending, as PMT does not have night coverage.

## 2023-08-31 NOTE — Progress Notes (Signed)
 PROGRESS NOTE    Tyler Scott  FMW:[id number] DOB: 05-Jun-1955 DOA: 08/27/2023 PCP: [name], MD    Brief Narrative:   Tyler Scott is a 68 y.o. male with past medical history significant for CLL/SLL, low-grade lymphoma, anemia, thrombocytopenia, chronic ITP, HTN, HLD, DM2, who presented to [facility] ED on 7/18 with intractable back and leg pain.  Patient was given oxycodone , reportedly not touching the pain.  Reports his back pain is throbbing.  Also complaining of weakness to his right leg with need for assistance with standing.  Has not changed from most recent hospitalization.  Denies urinary or incontinence or saddle anesthesia.  Recently admitted 6/27 - 7/9 for low back pain.  Underwent retroperitoneal lymph node biopsy on 6/30 with pathology notable for low-grade lymphoma.  Patient was transfused 1U PRBC during his hospitalization.  Chemotherapy regimen with Bendamustine /rituximab  orders placed to start as outpatient along with weekly Nplate . He had MRI of the lumbar spine done on 08/06/2023 which showed diffuse marrow disease likely due to lymphoma and repeat MRI on 08/28/2023 showed similar finding but slightly worsening.  Patient has been under the care of oncologist and pain medication likely need to be upgraded.  TRH consulted for admission for further evaluation management of intractable pain of malignancy.  Assessment & Plan:   Intractable pain of malignancy Patient presenting with persistent/worsening lower extremity and back pain.  Patient with known diagnosis of CLL and recently diagnosed with lymphoma.  Reports right lower extremity weakness but denies urinary continence or saddle paresthesia.  MRI L-spine with and without contrast 7/19 with diffusely infiltrative marrow of the visible skeletal structures consistent with diffuse skeletal lymphoma without pathological fracture, subtle evidence of early extraosseous extension of disease, no malignant spinal stenosis  or neural impingement, bulky and severe lymphadenopathy throughout the visible abdomen and pelvis not secondly changed from CT prior month. -- Palliative care following, appreciate assistance -- MS Contin  15 mg p.o. every 8 hours -- Dilaudid  2-4 mg p.o. every 4 hours as needed moderate/severe breakthrough pain -- Dilaudid  1 mg IV every 3 hours as needed breakthrough pain not adequately treated with oral Dilaudid  -- Senokot 1 tablet p.o. twice daily -- MiraLAX  daily  Hx CLL Lymphoma, low-grade new diagnosis Biopsy retroperitoneal lymph node 6/30 with pathology consistent with low-grade lymphoma consistent with CLL/SLL. -- Oncology plans to initiate chemotherapy 7/23, if unable to discharge due to pain will need to reschedule  Normocytic anemia -- Hgb 7.4>8.8>>7.9>7.5 -- Transfuse 1 unit PRBC today  HTN -- Amlodipine  10 mg p.o. daily  DM2 Hemoglobin A1c 5.18 July 2023. -- SSI for coverage -- CBG before every meal/at bedtime  Hyperlipidemia -- Zocor  20 mg p.o. daily  Chronic ITP -- Plt 62>75>>74>75>66. stable   Hyponatremia -- Na 132>128>>132>131>130; stable   DVT prophylaxis: SCDs Start: 08/27/23 2117    Code Status: Full Code Family Communication: No family present at bedside this morning  Disposition Plan:  Level of care: Med-Surg Status is: Inpatient Remains inpatient appropriate because: Transfusing 1 unit PRBC, needs optimize pain control    Consultants:  Medical oncology, Dr. Onesimo Palliative care  Procedures:  None  Antimicrobials:  None   Subjective: Patient seen examined bedside, lying in bed.  Continues to complain of intractable pain.  Discussed with palliative care this morning, adjusting Dilaudid  dosage today.  Discussed with oncology, if patient able to discharge home today will not be able to receive his chemotherapy tomorrow.  Patient with no other complaints or concerns at this time.  Denies headache, no visual changes, no chest pain, no  palpitations, no shortness of breath, no abdominal pain, no fever/chills/night sweats, no nausea/vomiting/diarrhea.  No acute events overnight per nursing staff.  Objective: Vitals:   08/30/23 2111 08/31/23 0540 08/31/23 1115 08/31/23 1406  BP: (!) 134/97 (!) 141/69  132/71  Pulse: (!) 101 97  93  Resp: 18 18  20   Temp: 98.2 F (36.8 C) 98.4 F (36.9 C)  98.5 F (36.9 C)  TempSrc: Oral Oral  Oral  SpO2: 97% 98%  99%  Weight:   63.1 kg   Height:        Intake/Output Summary (Last 24 hours) at 08/31/2023 1453 Last data filed at 08/31/2023 1055 Gross per 24 hour  Intake 240 ml  Output 500 ml  Net -260 ml   Filed Weights   08/27/23 1609 08/27/23 2142 08/31/23 1115  Weight: 63.5 kg 62.7 kg 63.1 kg    Examination:  Physical Exam: GEN: NAD, alert and oriented x 3, wd/wn HEENT: NCAT, PERRL, EOMI, sclera clear, MMM PULM: CTAB w/o wheezes/crackles, normal respiratory effort, on room air CV: RRR w/o M/G/R GI: abd soft, NTND, NABS, no R/G/M MSK: no peripheral edema NEURO: No focal neurological deficits PSYCH: normal mood/affect Integumentary: No concerning rash/lesions/wounds noted on exposed skin surfaces    Data Reviewed: I have personally reviewed following labs and imaging studies  CBC: Recent Labs  Lab 08/28/23 0641 08/28/23 0851 08/29/23 0624 08/29/23 1012 08/30/23 0603  WBC 17.2* 22.1* 15.9* 12.9* 9.8  NEUTROABS  --  3.6  --   --   --   HGB 8.3* 8.8* 8.1* 7.9* 7.5*  HCT 25.7* 28.1* 25.2* 25.2* 23.5*  MCV 97.7 98.3 98.1 97.7 97.1  PLT 75* 82* 74* 75* 66*   Basic Metabolic Panel: Recent Labs  Lab 08/28/23 0851 08/28/23 1304 08/28/23 1706 08/29/23 0624 08/29/23 1012 08/30/23 0603  NA 128* 129* 132*  --  131* 130*  K 5.5* 5.0 4.5  --  4.7 4.2  CL 93* 93* 94*  --  95* 94*  CO2 22 21* 23  --  26 26  GLUCOSE 258* 432* 234*  --  229* 184*  BUN 24* 35* 36*  --  32* 27*  CREATININE 0.96 1.14 1.29*  --  1.02 1.12  CALCIUM 9.4 9.3 9.5  --  9.5 9.4  MG  --    --   --  2.0  --   --    GFR: Estimated Creatinine Clearance: 57.1 mL/min (by C-G formula based on SCr of 1.12 mg/dL). Liver Function Tests: Recent Labs  Lab 08/28/23 1706  AST 32  ALT 18  ALKPHOS 117  BILITOT 0.8  PROT 7.8  ALBUMIN 3.6   No results for input(s): LIPASE, AMYLASE in the last 168 hours. No results for input(s): AMMONIA in the last 168 hours. Coagulation Profile: No results for input(s): INR, PROTIME in the last 168 hours. Cardiac Enzymes: No results for input(s): CKTOTAL, CKMB, CKMBINDEX, TROPONINI in the last 168 hours. BNP (last 3 results) No results for input(s): PROBNP in the last 8760 hours. HbA1C: No results for input(s): HGBA1C in the last 72 hours. CBG: Recent Labs  Lab 08/30/23 0713 08/30/23 1157 08/30/23 1632 08/31/23 0725 08/31/23 1128  GLUCAP 186* 168* 144* 224* 320*   Lipid Profile: No results for input(s): CHOL, HDL, LDLCALC, TRIG, CHOLHDL, LDLDIRECT in the last 72 hours. Thyroid  Function Tests: No results for input(s): TSH, T4TOTAL, FREET4, T3FREE, THYROIDAB in the last 72 hours.  Anemia Panel: No results for input(s): VITAMINB12, FOLATE, FERRITIN, TIBC, IRON, RETICCTPCT in the last 72 hours. Sepsis Labs: No results for input(s): PROCALCITON, LATICACIDVEN in the last 168 hours.  No results found for this or any previous visit (from the past 240 hours).       Radiology Studies: No results found.      Scheduled Meds:  sodium chloride    Intravenous Once   acyclovir   400 mg Oral Daily   allopurinol   150 mg Oral Daily   amLODipine   10 mg Oral Daily   feeding supplement  237 mL Oral BID BM   gabapentin   100 mg Oral TID   insulin  aspart  0-9 Units Subcutaneous TID WC   morphine   15 mg Oral Q8H   polyethylene glycol  17 g Oral Daily   senna  1 tablet Oral BID   simvastatin   20 mg Oral q1800   Continuous Infusions:   LOS: 3 days    Time spent: 52 minutes spent on  08/31/2023 caring for this patient face-to-face including chart review, ordering labs/tests, documenting, discussion with nursing staff, consultants, updating family and interview/physical exam    Camellia PARAS [place], DO [facility] Available via Epic secure chat 7am-7pm After these hours, please refer to coverage provider listed on [email or web address] 08/31/2023, 2:53 PM

## 2023-08-31 NOTE — Progress Notes (Signed)
 Occupational Therapy Treatment Patient Details Name: Tyler Scott MRN: [id number] DOB: 12/22/1955 Today's Date: 08/31/2023   History of present illness Pt is a 68 yr old male presenting on 7/18 with back and leg pain.  Noted recent admission 6/27-7/9 for low back pain.  Admitted for cancer related break through pain. A MRI of the spine from 08-28-23 revealed the following:  1. Diffusely infiltrated marrow of the visible skeletal structures, most consistent with a diffuse skeletal lymphoma in this setting. No pathologic fracture. 2. Subtle evidence of early extraosseous extension of disease at some levels. But no malignant spinal stenosis or neural impingement. 3. Bulky and severe lymphadenopathy throughout the visible abdomen and pelvis, not significantly changed from CT last month. PMH includes: allergy, CLL, DM1, diabetic retinopathy, HTN.   OT comments  The pt was motivated to participate in the session. He reported feelings of new upper body weakness, specifically stating, my strength is leaving me. As such, OT instructed him on therapeutic exercises for strengthening needed to facilitate progressive ADL performance. He performed multiple reps and 1 set of diagonal exchanges, lateral pulls, and vertical exchanges with supervision seated EOB. He further progressed to performing toileting at bathroom level and grooming in standing at the sink. He required occasional redirection to tasks. OT reinforced implementing RLE PWB as needed, as he stated FWB to his RLE while in standing, resulted in increased pain. Continue OT plan of care. Home health OT is recommended.       If plan is discharge home, recommend the following:  Help with stairs or ramp for entrance;Assist for transportation;Assistance with cooking/housework;Direct supervision/assist for medications management;Direct supervision/assist for financial management   Equipment Recommendations  None recommended by OT     Recommendations for Other Services      Precautions / Restrictions Precautions Precautions: Fall Restrictions Weight Bearing Restrictions Per Provider Order: No       Mobility Bed Mobility Overal bed mobility: Needs Assistance Bed Mobility: Supine to Sit     Supine to sit: Contact guard, HOB elevated          Transfers Overall transfer level: Needs assistance Equipment used: Rolling walker (2 wheels) Transfers: Sit to/from Stand Sit to Stand: Contact guard assist, From elevated surface        Balance     Sitting balance-Leahy Scale: Good       Standing balance-Leahy Scale: Fair          ADL either performed or assessed with clinical judgement   ADL Overall ADL's : Needs assistance/impaired     Grooming: Contact guard assist;Standing Grooming Details (indicate cue type and reason): He required intermittent steadying assist to perform teeth brushing and hand washing at sink level.                 Toilet Transfer: Contact guard assist;Rolling walker (2 wheels);Ambulation;Grab bars Toilet Transfer Details (indicate cue type and reason): He ambulated to and from the bathroom in his room using a RW. He held onto grab bar for support as needed. Toileting- Clothing Manipulation and Hygiene: Contact guard assist;Sit to/from stand Toileting - Clothing Manipulation Details (indicate cue type and reason): He performed toileting tasks and bathroom level. He urinated only, with regards to voiding.       General ADL Comments: The pt required occasional redirection to tasks. OT further reinforced implementation of RLE PWB as needed, as the pt stated FWB in standing to his RLE resulted in increased R groin and RLE pain.  Communication Communication Communication: No apparent difficulties   Cognition Arousal: Alert Behavior During Therapy: WFL for tasks assessed/performed Cognition: Difficult to assess             OT - Cognition Comments: pt verbose  and requires redirection to tasks, would benefit from higher level cognitive testing                 Following commands: Intact                      Pertinent Vitals/ Pain       Pain Assessment Pain Assessment: 0-10 Pain Score: 5  Pain Location: R groin and thigh Pain Intervention(s): Limited activity within patient's tolerance, Monitored during session, Other (comment) (He reported recently receiving pain medication.)   Frequency  Min 2X/week        Progress Toward Goals  OT Goals(current goals can now be found in the care plan section)     Acute Rehab OT Goals OT Goal Formulation: With patient Time For Goal Achievement: 09/12/23 Potential to Achieve Goals: Good  Plan         AM-PAC OT 6 Clicks Daily Activity     Outcome Measure   Help from another person eating meals?: None Help from another person taking care of personal grooming?: A Little Help from another person toileting, which includes using toliet, bedpan, or urinal?: A Little Help from another person bathing (including washing, rinsing, drying)?: A Little Help from another person to put on and taking off regular upper body clothing?: A Little Help from another person to put on and taking off regular lower body clothing?: A Little 6 Click Score: 19    End of Session Equipment Utilized During Treatment: Rolling walker (2 wheels)  OT Visit Diagnosis: Unsteadiness on feet (R26.81);Pain Pain - part of body:  (RLE and R groin)   Activity Tolerance Patient limited by pain   Patient Left in chair;with call bell/phone within reach   Nurse Communication Mobility status        Time: 8854-8788 OT Time Calculation (min): 26 min  Charges: OT General Charges $OT Visit: 1 Visit OT Treatments $Self Care/Home Management : 8-22 mins $Therapeutic Activity: 8-22 mins    Delanna LITTIE Molt, OTR/L 08/31/2023, 1:58 PM

## 2023-09-01 ENCOUNTER — Inpatient Hospital Stay

## 2023-09-01 ENCOUNTER — Inpatient Hospital Stay: Admitting: Hematology

## 2023-09-01 DIAGNOSIS — G893 Neoplasm related pain (acute) (chronic): Secondary | ICD-10-CM | POA: Diagnosis not present

## 2023-09-01 DIAGNOSIS — I10 Essential (primary) hypertension: Secondary | ICD-10-CM | POA: Diagnosis not present

## 2023-09-01 DIAGNOSIS — D649 Anemia, unspecified: Secondary | ICD-10-CM

## 2023-09-01 DIAGNOSIS — C9110 Chronic lymphocytic leukemia of B-cell type not having achieved remission: Secondary | ICD-10-CM | POA: Diagnosis not present

## 2023-09-01 DIAGNOSIS — C8590 Non-Hodgkin lymphoma, unspecified, unspecified site: Secondary | ICD-10-CM | POA: Diagnosis not present

## 2023-09-01 DIAGNOSIS — D693 Immune thrombocytopenic purpura: Secondary | ICD-10-CM

## 2023-09-01 DIAGNOSIS — D696 Thrombocytopenia, unspecified: Secondary | ICD-10-CM

## 2023-09-01 LAB — GLUCOSE, CAPILLARY
Glucose-Capillary: 235 mg/dL — ABNORMAL HIGH (ref 70–99)
Glucose-Capillary: 253 mg/dL — ABNORMAL HIGH (ref 70–99)
Glucose-Capillary: 280 mg/dL — ABNORMAL HIGH (ref 70–99)
Glucose-Capillary: 279 mg/dL — ABNORMAL HIGH (ref 70–99)

## 2023-09-01 LAB — TYPE AND SCREEN
ABO/RH(D): A POS
Antibody Screen: NEGATIVE
Unit division: 0

## 2023-09-01 LAB — BPAM RBC
Blood Product Expiration Date: 202508172359
ISSUE DATE / TIME: 202507221457
Unit Type and Rh: 6200

## 2023-09-01 MED ORDER — METHOCARBAMOL 500 MG PO TABS
1000.0000 mg | ORAL_TABLET | Freq: Once | ORAL | Status: AC
  Administered 2023-09-02: 1000 mg via ORAL
  Filled 2023-09-01: qty 2

## 2023-09-01 MED ORDER — METHOCARBAMOL 500 MG PO TABS: 1000.0000 mg | ORAL_TABLET | Freq: Three times a day (TID) | ORAL | Status: DC | PRN

## 2023-09-01 MED ORDER — INSULIN GLARGINE-YFGN 100 UNIT/ML ~~LOC~~ SOLN
12.0000 [IU] | Freq: Every day | SUBCUTANEOUS | Status: DC
  Administered 2023-09-01 – 2023-09-02 (×2): 12 [IU] via SUBCUTANEOUS
  Filled 2023-09-01 (×2): qty 0.12

## 2023-09-01 NOTE — Progress Notes (Signed)
 PROGRESS NOTE   Tyler Scott  FMW:[id number] DOB: [birth year] DOA: 08/27/2023 PCP: [name], MD   Date of Service: the patient was seen and examined on 09/01/2023  Brief Narrative:  Tyler Scott is a 68 y.o. male with medical history significant of HTN, HLD, T2DM, known prior history of CLL, chronic ITP who presented to ED with intractable back pain leg pain.  Of note, patient was admitted 08/06/23-08/18/23 for low back pain. Had retroperitoneal LN biopsy on 08/09/23. Pathology showed low grade lymphoma. Needed 1 unit PRBC during this stay. Chemotherapy regimen with Bendamustine /rituximab  orders placed to start as outpatient along with weekly Nplate .  He had MRI of the lumbar spine done on 08/06/2023 which showed diffuse marrow disease likely due to lymphoma and repeat MRI on 08/28/2023 showed similar finding but slightly worsening.    Palliative care has been consulted and is following.  Patient is been titrated on a regimen of both oral and intravenous opiate-based analgesics.    Assessment & Plan Cancer-related breakthrough pain Palliative care team assisting with pain management  Currently receiving MS Contin  orally 50 mg every 8 hours with as needed oral and intravenous Dilaudid  for breakthrough.   I am adding as needed Robaxin  to the regimen as patient feels that he is having muscle spasms.   Continuing low-dose gabapentin  1 mg 3 times daily  If symptoms continue to be intractable will consider short course of steroids.    CLL (chronic lymphocytic leukemia) (HCC) Diagnosed CLL in 2012 status post several previous therapies  Follows with Dr. Onesimo Retroperitoneal lymph node biopsy on 6/30 supportive of low-grade lymphoma MRI of the lumbar spine performed on 6/27 in addition to repeat lumbar spine on 7/19 revealing diffuse marrow disease likely secondary to lymphoma Thrombocytopenia (HCC) Chronic ITP (idiopathic thrombocytopenia) (HCC) Known history of chronic ITP   no clinical evidence of bleeding We will obtain repeat CBC to monitor Hematology recommending transfusion if platelet count drops below 20K or if patient begins to bleed  Anemia Hematology recommended administration of 1 unit packed red blood cell transfusion on 7/22 Essential hypertension Continue norvasc  10mg  daily  Controlled type 2 diabetes mellitus with retinopathy, with long-term current use of insulin  (HCC) A1C of 5.8 in June of 2025  Continue long acting insulin   SSI and accuchecks QAC/HS  Hyperlipidemia Continue zocor  20mg  daily      Subjective:  Patient continues to complain of severe low back pain, radiating into the bilateral thighs, throbbing in quality, 10 out of 10 in intensity, worse with attempts to ambulate or move his legs.  Physical Exam:  Vitals:   08/31/23 1515 08/31/23 1730 08/31/23 2048 09/01/23 0458  BP: (!) 159/70 134/63 (!) 140/78 (!) 157/82  Pulse: 98 96 97 (!) 103  Resp: 16 16 18 18   Temp: 98.2 F (36.8 C) 98.4 F (36.9 C) 98.6 F (37 C) 99 F (37.2 C)  TempSrc: Oral Oral Oral Oral  SpO2: 100% 99% 98% 100%  Weight:      Height:        Constitutional: Awake alert and oriented x3, patient is in mild distress due to pain. Skin: no rashes, no lesions, good skin turgor noted. Eyes: Pupils are equally reactive to light.  No evidence of scleral icterus or conjunctival pallor.  ENMT: Moist mucous membranes noted.  Posterior pharynx clear of any exudate or lesions.   Respiratory: clear to auscultation bilaterally, no wheezing, no crackles. Normal respiratory effort. No accessory muscle use.  Cardiovascular: Regular rate and  rhythm, no murmurs / rubs / gallops. No extremity edema. 2+ pedal pulses. No carotid bruits.  Abdomen: Abdomen is soft and nontender.  No evidence of intra-abdominal masses.  Positive bowel sounds noted in all quadrants.   Musculoskeletal: No joint deformity upper and lower extremities. Good ROM, no contractures. Normal muscle  tone.    Data Reviewed:  I have personally reviewed and interpreted labs, imaging.  Significant findings are   CBC: Recent Labs  Lab 08/28/23 0641 08/28/23 0851 08/29/23 0624 08/29/23 1012 08/30/23 0603  WBC 17.2* 22.1* 15.9* 12.9* 9.8  NEUTROABS  --  3.6  --   --   --   HGB 8.3* 8.8* 8.1* 7.9* 7.5*  HCT 25.7* 28.1* 25.2* 25.2* 23.5*  MCV 97.7 98.3 98.1 97.7 97.1  PLT 75* 82* 74* 75* 66*   Basic Metabolic Panel: Recent Labs  Lab 08/28/23 0851 08/28/23 1304 08/28/23 1706 08/29/23 0624 08/29/23 1012 08/30/23 0603  NA 128* 129* 132*  --  131* 130*  K 5.5* 5.0 4.5  --  4.7 4.2  CL 93* 93* 94*  --  95* 94*  CO2 22 21* 23  --  26 26  GLUCOSE 258* 432* 234*  --  229* 184*  BUN 24* 35* 36*  --  32* 27*  CREATININE 0.96 1.14 1.29*  --  1.02 1.12  CALCIUM 9.4 9.3 9.5  --  9.5 9.4  MG  --   --   --  2.0  --   --    GFR: Estimated Creatinine Clearance: 57.1 mL/min (by C-G formula based on SCr of 1.12 mg/dL). Liver Function Tests: Recent Labs  Lab 08/28/23 1706  AST 32  ALT 18  ALKPHOS 117  BILITOT 0.8  PROT 7.8  ALBUMIN 3.6     Code Status:  Full code.  Code status decision has been confirmed with: patient Family Communication: Plan of care discussed with wife   Severity of Illness:  The appropriate patient status for this patient is INPATIENT. Inpatient status is judged to be reasonable and necessary in order to provide the required intensity of service to ensure the patient's safety. The patient's presenting symptoms, physical exam findings, and initial radiographic and laboratory data in the context of their chronic comorbidities is felt to place them at high risk for further clinical deterioration. Furthermore, it is not anticipated that the patient will be medically stable for discharge from the hospital within 2 midnights of admission.   * I certify that at the point of admission it is my clinical judgment that the patient will require inpatient hospital  care spanning beyond 2 midnights from the point of admission due to high intensity of service, high risk for further deterioration and high frequency of surveillance required.*  Time spent:  55 minutes  Author:  Zachary JINNY Ba MD  09/01/2023 10:31 AM

## 2023-09-01 NOTE — Progress Notes (Signed)
 Daily Progress Note   Patient Name: Tyler Scott       Date: 09/01/2023 DOB: 04/22/1955  Age: 68 y.o. MRN#: [id number] Attending Physician: Kenard Zachary PARAS, MD Primary Care Physician: [name], MD Admit Date: 08/27/2023 Length of Stay: 4 days  Reason for Consultation/Follow-up: Pain control  Subjective:   CC: Patient noting continued pain.  Following up regarding pain management.  Subjective:  Reviewed documentation in EMR including hospitalist note.    EMR reviewed for past 24 hours opioid use   Pain medication use noted and discussed with patient, at present, he complains of uncontrolled pain. Stated that he already got PO opioids, asking for IV PRN opioid. Sitting by edge of bed, able to wear scrubs/ambulate by himself, complains of back pain.   Answered all questions as able at that time.  Noted palliative medicine team continue to follow with patient's medical journey.   Review of Systems Back pain Objective:   Vital Signs:  BP (!) 157/82 (BP Location: Right Arm)   Pulse (!) 103   Temp 99 F (37.2 C) (Oral)   Resp 18   Ht 5' 8 (1.727 m)   Wt 63.1 kg   SpO2 100%   BMI 21.15 kg/m   Physical Exam: General: NAD, alert, pleasant Cardiovascular: RRR Respiratory: no increased work of breathing noted, not in respiratory distress Neuro: Awake, interactive Psych: Tangential at times  Assessment & Plan:   Assessment: Patient is a 68 year old male with a past medical history of hypertension, hyperlipidemia, type 2 diabetes mellitus, chronic ITP, and chronic CLL with recent diagnosis of low-grade lymphoma who was admitted on 08/27/2023 for management of worsening pain in his back and leg.  Patient was recently hospitalized from 08/06/2023 - 08/18/2023 for low back pain which is when biopsy was performed showing low grade lymphoma.  Upon admission, patient receiving management for cancer related pain.  Patient undergoing further MRI imaging due to worsening  weakness in right lower extremity.  Oncology consulted for recommendations.  Palliative medicine consulted for pain management.   Recommendations/Plan: # Symptom management Patient is receiving these palliative interventions for symptom management with an intent to improve quality of life.                 - Pain, in setting of recent diagnosis of CLL progressed to low-grade lymphoma                                 - MRI obtained 08/28/2023 reviewed.  Defer to oncology regarding cancer management.                                as needed oral Dilaudid  2-4 mg every 3 hours as needed: 20-22 mg total PO Dilaudid  use in the past 24 hours noted.     MS Contin  15 mg every 8 hours scheduled.  Continue to monitor renal function in the setting.                                  -Continue IV Dilaudid  to 1 mg every 3 hours as needed breakthrough after oral opioids  gabapentin  to 100 mg twice daily Continue current pain and non-pain symptom management regimen, no new inpatient PMT specific recommendations at this time.                 -Constipation                               - Continue MiraLAX  17 g daily             -                               - Continue senna 1 tab twice daily                               Continue to monitor bowel movements and adjust medications based on response.   # Psycho-social/Spiritual Support:  - Support System: Wife   # Discharge Planning:  recommend discharge home with outpatient palliative support, will reach out to palliative colleagues Ms Cousar NP at the cancer center for palliative care appointment for symptom management.  Discussed with: Patient   Thank you for allowing the palliative care team to participate in the care [name].  low MDM Lonia Serve MD.  Palliative Care Provider PMT # [phone]  If patient remains symptomatic despite maximum doses, please call PMT at [phone] between 0700 and 1900. Outside of  these hours, please call attending, as PMT does not have night coverage.

## 2023-09-01 NOTE — Progress Notes (Signed)
 Mobility Specialist - Progress Note   09/01/23 1525  Mobility  Activity Ambulated with assistance in hallway  Level of Assistance Contact guard assist, steadying assist  Assistive Device Front wheel walker  Distance Ambulated (ft) 230 ft  Activity Response Tolerated well  Mobility Referral Yes  Mobility visit 1 Mobility  Mobility Specialist Start Time (ACUTE ONLY) 1457  Mobility Specialist Stop Time (ACUTE ONLY) 1520  Mobility Specialist Time Calculation (min) (ACUTE ONLY) 23 min   Pt received in bed and agreeable to mobility. Pt took x1 seated rest break d/t pain. No complaints during session. Pt to EOB after session with all needs met.    [facility]

## 2023-09-01 NOTE — Progress Notes (Signed)
 PT Cancellation Note  Patient Details Name: Tyler Scott MRN: [id number] DOB: Jan 31, 1956   Cancelled Treatment:    Reason Eval/Treat Not Completed: Other (comment) Pt declined PT.  Reports just ambulated with MS (noted 230' ~1500) and feels high from meds.  Pt was rambling and easily distracted sitting EOB.  Assisted pt in return to supine and turned on bed alarm.   Benjiman, PT Acute Rehab [facility] [phone]   Benjiman VEAR Mulberry 09/01/2023, 4:31 PM

## 2023-09-01 NOTE — Inpatient Diabetes Management (Signed)
 Inpatient Diabetes Program Recommendations  AACE/ADA: New Consensus Statement on Inpatient Glycemic Control (2015)  Target Ranges:  Prepandial:   less than 140 mg/dL      Peak postprandial:   less than 180 mg/dL (1-2 hours)      Critically ill patients:  140 - 180 mg/dL   Lab Results  Component Value Date   GLUCAP 253 (H) 09/01/2023   HGBA1C 5.8 (H) 08/07/2023    Review of Glycemic Control  Diabetes history: DM2 Outpatient Diabetes medications: Jardiance  12.5 mg daily, Lantus  20 QHS Current orders for Inpatient glycemic control: Novolog  0-9 TID  Inpatient Diabetes Program Recommendations:    Consider adding Semglee  10 units daily  Continue to follow.  Thank you. Shona Brandy, RD, LDN, CDCES Inpatient Diabetes Coordinator [phone]

## 2023-09-01 NOTE — Progress Notes (Incomplete)
 PROGRESS NOTE   Tyler Scott  FMW:[id number] DOB: 01-05-1956 DOA: 08/27/2023 PCP: [name], MD   Date of Service: the patient was seen and examined on 09/01/2023  Brief Narrative:  Tyler Scott is a 68 y.o. male with medical history significant of HTN, HLD, T2DM, known prior history of CLL, chronic ITP who presented to ED with intractable back pain leg pain.  Of note, patient was admitted 08/06/23-08/18/23 for low back pain. Had retroperitoneal LN biopsy on 08/09/23. Pathology showed low grade lymphoma. Needed 1 unit PRBC during this stay. Chemotherapy regimen with Bendamustine /rituximab  orders placed to start as outpatient along with weekly Nplate .  He had MRI of the lumbar spine done on 08/06/2023 which showed diffuse marrow disease likely due to lymphoma and repeat MRI on 08/28/2023 showed similar finding but slightly worsening.    Palliative care has been consulted and is following.  Patient is been titrated on a regimen of both oral and intravenous opiate-based analgesics.    Assessment & Plan Cancer-related breakthrough pain Palliative care team assisting with pain management  Currently receiving MS Contin  orally 50 mg every 8 hours with as needed oral and intravenous Dilaudid  for breakthrough.   I am adding as needed Robaxin  to the regimen as patient feels that he is having muscle spasms.   Continuing low-dose gabapentin  1 mg 3 times daily  If symptoms continue to be intractable will consider short course of steroids.    Thrombocytopenia (HCC) Chronic ITP (idiopathic thrombocytopenia) (HCC) Known history of chronic ITP  no clinical evidence of bleeding We will obtain repeat CBC to monitor Hematology recommending transfusion if platelet count drops below 20K or if patient begins to bleed  Anemia Hematology recommended administration of 1 unit packed red blood cell transfusion on 7/22 Essential hypertension Continue norvasc  10mg  daily  Controlled type 2  diabetes mellitus with retinopathy, with long-term current use of insulin  (HCC) A1C of 5.8 in June of 2025  Continue long acting insulin   SSI and accuchecks QAC/HS  Hyperlipidemia Continue zocor  20mg  daily  CLL (chronic lymphocytic leukemia) (HCC) -- Diagnosed CLL in 2012 -- status post several previous therapies including Bendamustine  and rituximab , Ibrutinib , and most recently on Calquence  since 2023.  -- Patient was supposed to be on Gazyva , venetoclax . He had many interruped cycles of Gazyva  due to No shows.  He was last seen in outpatient oncology on 07/06/23 at which time he was scheduled for C4D1 Gazyva  however was HELD due to infection/acute left sided epidydimo-orchitis for which he was on antibiotics.      Subjective:  ***  Physical Exam:  Vitals:   08/31/23 1515 08/31/23 1730 08/31/23 2048 09/01/23 0458  BP: (!) 159/70 134/63 (!) 140/78 (!) 157/82  Pulse: 98 96 97 (!) 103  Resp: 16 16 18 18   Temp: 98.2 F (36.8 C) 98.4 F (36.9 C) 98.6 F (37 C) 99 F (37.2 C)  TempSrc: Oral Oral Oral Oral  SpO2: 100% 99% 98% 100%  Weight:      Height:        *** Constitutional: Awake alert and oriented x3, no associated distress.   Skin: no rashes, no lesions, good skin turgor noted. Eyes: Pupils are equally reactive to light.  No evidence of scleral icterus or conjunctival pallor.  ENMT: Moist mucous membranes noted.  Posterior pharynx clear of any exudate or lesions.   Respiratory: clear to auscultation bilaterally, no wheezing, no crackles. Normal respiratory effort. No accessory muscle use.  Cardiovascular: Regular rate and rhythm, no  murmurs / rubs / gallops. No extremity edema. 2+ pedal pulses. No carotid bruits.  Abdomen: Abdomen is soft and nontender.  No evidence of intra-abdominal masses.  Positive bowel sounds noted in all quadrants.   Musculoskeletal: No joint deformity upper and lower extremities. Good ROM, no contractures. Normal muscle tone.    Data  Reviewed:  I have personally reviewed and interpreted labs, imaging.  Significant findings are ***  CBC: Recent Labs  Lab 08/28/23 0641 08/28/23 0851 08/29/23 0624 08/29/23 1012 08/30/23 0603  WBC 17.2* 22.1* 15.9* 12.9* 9.8  NEUTROABS  --  3.6  --   --   --   HGB 8.3* 8.8* 8.1* 7.9* 7.5*  HCT 25.7* 28.1* 25.2* 25.2* 23.5*  MCV 97.7 98.3 98.1 97.7 97.1  PLT 75* 82* 74* 75* 66*   Basic Metabolic Panel: Recent Labs  Lab 08/28/23 0851 08/28/23 1304 08/28/23 1706 08/29/23 0624 08/29/23 1012 08/30/23 0603  NA 128* 129* 132*  --  131* 130*  K 5.5* 5.0 4.5  --  4.7 4.2  CL 93* 93* 94*  --  95* 94*  CO2 22 21* 23  --  26 26  GLUCOSE 258* 432* 234*  --  229* 184*  BUN 24* 35* 36*  --  32* 27*  CREATININE 0.96 1.14 1.29*  --  1.02 1.12  CALCIUM 9.4 9.3 9.5  --  9.5 9.4  MG  --   --   --  2.0  --   --    GFR: Estimated Creatinine Clearance: 57.1 mL/min (by C-G formula based on SCr of 1.12 mg/dL). Liver Function Tests: Recent Labs  Lab 08/28/23 1706  AST 32  ALT 18  ALKPHOS 117  BILITOT 0.8  PROT 7.8  ALBUMIN 3.6    Coagulation Profile: No results for input(s): INR, PROTIME in the last 168 hours.   EKG/Telemetry: Personally reviewed.  Rhythm is *** with heart rate of ***.  No dynamic ST segment changes appreciated.   Code Status:  {Palliative Code status:23503}.  Code status decision has been confirmed with: *** Family Communication: ***    Severity of Illness:  {Observation/Inpatient:21159}  Time spent:  *** minutes  Author:  Zachary JINNY Ba MD  09/01/2023 10:31 AM

## 2023-09-01 NOTE — Progress Notes (Signed)
 Patient and family express wishes to be discharged early in AM so patient can receive chemotherapy treatment at cancer center the day he discharges. At this time we do not have a date or time for chemo treatment. Family is aware. RN navigator to arrange treatment schedule.

## 2023-09-01 NOTE — Plan of Care (Signed)
   Problem: Education: Goal: Ability to describe self-care measures that may prevent or decrease complications (Diabetes Survival Skills Education) will improve Outcome: Progressing Goal: Individualized Educational Video(s) Outcome: Progressing   Problem: Coping: Goal: Ability to adjust to condition or change in health will improve Outcome: Progressing

## 2023-09-02 ENCOUNTER — Ambulatory Visit

## 2023-09-02 ENCOUNTER — Encounter: Payer: Self-pay | Admitting: Hematology

## 2023-09-02 ENCOUNTER — Other Ambulatory Visit (HOSPITAL_COMMUNITY): Payer: Self-pay

## 2023-09-02 ENCOUNTER — Other Ambulatory Visit: Payer: Self-pay

## 2023-09-02 DIAGNOSIS — C8590 Non-Hodgkin lymphoma, unspecified, unspecified site: Secondary | ICD-10-CM | POA: Diagnosis not present

## 2023-09-02 DIAGNOSIS — G893 Neoplasm related pain (acute) (chronic): Secondary | ICD-10-CM | POA: Diagnosis not present

## 2023-09-02 DIAGNOSIS — I10 Essential (primary) hypertension: Secondary | ICD-10-CM | POA: Diagnosis not present

## 2023-09-02 LAB — COMPREHENSIVE METABOLIC PANEL WITH GFR
ALT: 22 U/L (ref 0–44)
AST: 43 U/L — ABNORMAL HIGH (ref 15–41)
Albumin: 3.3 g/dL — ABNORMAL LOW (ref 3.5–5.0)
Alkaline Phosphatase: 202 U/L — ABNORMAL HIGH (ref 38–126)
Anion gap: 14 (ref 5–15)
BUN: 24 mg/dL — ABNORMAL HIGH (ref 8–23)
CO2: 24 mmol/L (ref 22–32)
Calcium: 10.4 mg/dL — ABNORMAL HIGH (ref 8.9–10.3)
Chloride: 91 mmol/L — ABNORMAL LOW (ref 98–111)
Creatinine, Ser: 0.92 mg/dL (ref 0.61–1.24)
GFR, Estimated: >60 mL/min
Glucose, Bld: 309 mg/dL — ABNORMAL HIGH (ref 70–99)
Potassium: 4.9 mmol/L (ref 3.5–5.1)
Sodium: 129 mmol/L — ABNORMAL LOW (ref 135–145)
Total Bilirubin: 1.4 mg/dL — ABNORMAL HIGH (ref 0.0–1.2)
Total Protein: 7.5 g/dL (ref 6.5–8.1)

## 2023-09-02 LAB — GLUCOSE, CAPILLARY
Glucose-Capillary: 289 mg/dL — ABNORMAL HIGH (ref 70–99)
Glucose-Capillary: 229 mg/dL — ABNORMAL HIGH (ref 70–99)
Glucose-Capillary: 186 mg/dL — ABNORMAL HIGH (ref 70–99)

## 2023-09-02 LAB — CBC WITH DIFFERENTIAL/PLATELET
Abs Immature Granulocytes: 0.05 10*3/uL (ref 0.00–0.07)
Basophils Absolute: 0.1 10*3/uL (ref 0.0–0.1)
Basophils Relative: 0 %
Eosinophils Absolute: 0 10*3/uL (ref 0.0–0.5)
Eosinophils Relative: 0 %
HCT: 26.5 % — ABNORMAL LOW (ref 39.0–52.0)
Hemoglobin: 8.6 g/dL — ABNORMAL LOW (ref 13.0–17.0)
Immature Granulocytes: 0 %
Lymphocytes Relative: 81 %
Lymphs Abs: 10.3 10*3/uL — ABNORMAL HIGH (ref 0.7–4.0)
MCH: 30.7 pg (ref 26.0–34.0)
MCHC: 32.5 g/dL (ref 30.0–36.0)
MCV: 94.6 fL (ref 80.0–100.0)
Monocytes Absolute: 0.5 10*3/uL (ref 0.1–1.0)
Monocytes Relative: 4 %
Neutro Abs: 1.9 10*3/uL (ref 1.7–7.7)
Neutrophils Relative %: 15 %
Platelets: 61 10*3/uL — ABNORMAL LOW (ref 150–400)
RBC: 2.8 MIL/uL — ABNORMAL LOW (ref 4.22–5.81)
RDW: 16.3 % — ABNORMAL HIGH (ref 11.5–15.5)
WBC: 12.8 10*3/uL — ABNORMAL HIGH (ref 4.0–10.5)
nRBC: 0 % (ref 0.0–0.2)

## 2023-09-02 LAB — MAGNESIUM: Magnesium: 1.5 mg/dL — ABNORMAL LOW (ref 1.7–2.4)

## 2023-09-02 MED ORDER — SENNA 8.6 MG PO TABS
1.0000 | ORAL_TABLET | Freq: Two times a day (BID) | ORAL | 2 refills | Status: DC
  Filled 2023-09-02: qty 60, 30d supply, fill #0

## 2023-09-02 MED ORDER — SENNA 8.6 MG PO TABS: 1.0000 | ORAL_TABLET | Freq: Two times a day (BID) | ORAL | 2 refills | Status: DC

## 2023-09-02 MED ORDER — MORPHINE SULFATE ER 15 MG PO TBCR: 15.0000 mg | EXTENDED_RELEASE_TABLET | Freq: Two times a day (BID) | ORAL | 0 refills | Status: DC

## 2023-09-02 MED ORDER — HYDROMORPHONE HCL 2 MG PO TABS
2.0000 mg | ORAL_TABLET | Freq: Four times a day (QID) | ORAL | 0 refills | Status: DC | PRN
  Filled 2023-09-02: qty 30, 8d supply, fill #0

## 2023-09-02 MED ORDER — MORPHINE SULFATE ER 15 MG PO TBCR
15.0000 mg | EXTENDED_RELEASE_TABLET | Freq: Two times a day (BID) | ORAL | 0 refills | Status: DC
  Filled 2023-09-02: qty 60, 30d supply, fill #0

## 2023-09-02 MED ORDER — GABAPENTIN 100 MG PO CAPS
100.0000 mg | ORAL_CAPSULE | Freq: Three times a day (TID) | ORAL | 1 refills | Status: DC
Start: 2023-09-02 — End: 2023-09-02
  Filled 2023-09-02: qty 90, 30d supply, fill #0

## 2023-09-02 MED ORDER — METHOCARBAMOL 500 MG PO TABS
1000.0000 mg | ORAL_TABLET | Freq: Three times a day (TID) | ORAL | 1 refills | Status: DC | PRN
Start: 2023-09-02 — End: 2023-10-06

## 2023-09-02 MED ORDER — HYDROMORPHONE HCL 2 MG PO TABS: 2.0000 mg | ORAL_TABLET | Freq: Four times a day (QID) | ORAL | 0 refills | Status: DC | PRN

## 2023-09-02 MED ORDER — POLYETHYLENE GLYCOL 3350 17 GM/SCOOP PO POWD: 17.0000 g | Freq: Every day | ORAL | 2 refills | Status: AC

## 2023-09-02 MED ORDER — GABAPENTIN 100 MG PO CAPS: 100.0000 mg | ORAL_CAPSULE | Freq: Three times a day (TID) | ORAL | 1 refills | Status: AC

## 2023-09-02 MED ORDER — METHOCARBAMOL 500 MG PO TABS
1000.0000 mg | ORAL_TABLET | Freq: Three times a day (TID) | ORAL | 1 refills | Status: DC | PRN
  Filled 2023-09-02: qty 60, 10d supply, fill #0

## 2023-09-02 MED ORDER — POLYETHYLENE GLYCOL 3350 17 GM/SCOOP PO POWD
17.0000 g | Freq: Every day | ORAL | 2 refills | Status: DC
  Filled 2023-09-02: qty 476, 28d supply, fill #0

## 2023-09-02 NOTE — Plan of Care (Addendum)
 Patient seems confused this morning and very forgetful, this RN spoke to wife about plan of care and plan to discharge patient home today. Wife and patient were notified of infusion appointment 7/30 @0845 . Both agreeable to discharge and wife will come pick him up at 1800 since she is out of town. 1800: Wife is here to pick pt up. Pt left in stable condition  Problem: Education: Goal: Ability to describe self-care measures that may prevent or decrease complications (Diabetes Survival Skills Education) will improve Outcome: Progressing Goal: Individualized Educational Video(s) Outcome: Progressing   Problem: Coping: Goal: Ability to adjust to condition or change in health will improve Outcome: Progressing   Problem: Fluid Volume: Goal: Ability to maintain a balanced intake and output will improve Outcome: Progressing   Problem: Health Behavior/Discharge Planning: Goal: Ability to identify and utilize available resources and services will improve Outcome: Progressing Goal: Ability to manage health-related needs will improve Outcome: Progressing   Problem: Metabolic: Goal: Ability to maintain appropriate glucose levels will improve Outcome: Progressing   Problem: Nutritional: Goal: Maintenance of adequate nutrition will improve Outcome: Progressing Goal: Progress toward achieving an optimal weight will improve Outcome: Progressing   Problem: Skin Integrity: Goal: Risk for impaired skin integrity will decrease Outcome: Progressing   Problem: Tissue Perfusion: Goal: Adequacy of tissue perfusion will improve Outcome: Progressing   Problem: Education: Goal: Knowledge of General Education information will improve Description: Including pain rating scale, medication(s)/side effects and non-pharmacologic comfort measures Outcome: Progressing   Problem: Health Behavior/Discharge Planning: Goal: Ability to manage health-related needs will improve Outcome: Progressing   Problem:  Clinical Measurements: Goal: Ability to maintain clinical measurements within normal limits will improve Outcome: Progressing Goal: Will remain free from infection Outcome: Progressing Goal: Diagnostic test results will improve Outcome: Progressing Goal: Respiratory complications will improve Outcome: Progressing Goal: Cardiovascular complication will be avoided Outcome: Progressing   Problem: Activity: Goal: Risk for activity intolerance will decrease Outcome: Progressing   Problem: Nutrition: Goal: Adequate nutrition will be maintained Outcome: Progressing   Problem: Coping: Goal: Level of anxiety will decrease Outcome: Progressing   Problem: Elimination: Goal: Will not experience complications related to bowel motility Outcome: Progressing Goal: Will not experience complications related to urinary retention Outcome: Progressing   Problem: Pain Managment: Goal: General experience of comfort will improve and/or be controlled Outcome: Progressing   Problem: Safety: Goal: Ability to remain free from injury will improve Outcome: Progressing   Problem: Skin Integrity: Goal: Risk for impaired skin integrity will decrease Outcome: Progressing

## 2023-09-02 NOTE — Plan of Care (Signed)
 PMT no charge note.   Note plans for discharge Appointments at the cancer center for chemotherapy as well as for palliative care are being arranged. No new inpatient PMT specific recommendations, continue current opioids on discharge.  No charge Lonia Serve MD [facility].

## 2023-09-02 NOTE — Plan of Care (Signed)
   Problem: Education: Goal: Ability to describe self-care measures that may prevent or decrease complications (Diabetes Survival Skills Education) will improve Outcome: Progressing Goal: Individualized Educational Video(s) Outcome: Progressing   Problem: Coping: Goal: Ability to adjust to condition or change in health will improve Outcome: Progressing

## 2023-09-02 NOTE — Assessment & Plan Note (Signed)
 Known history of chronic ITP  no clinical evidence of bleeding We will obtain repeat CBC to monitor Hematology recommending transfusion if platelet count drops below 20K or if patient begins to bleed

## 2023-09-02 NOTE — Progress Notes (Signed)
 Physical Therapy Treatment Patient Details Name: Tyler Scott MRN: [id number] DOB: [birth year] Today's Date: 09/02/2023   History of Present Illness Pt is a 68 y/o male presenting on 7/18 with back and leg pain.  Noted recent admission 6/27-7/9 for low back pain.  Admitted for cancer related breath through pain. MRI 7/19 with showed diffuse marrow disease likely due to lymphoma slightly worsening per MD. PMH includes: allergy, CLL, DM1, diabetic retinopathy, HTN.    PT Comments  Pt reports feeling tired and having received pain medication.  Pt ambulated however limited distance due to pt reporting pain, woozy and fatigue.  RN notified and aware.  RN reports pt confused.  Per chart, pt likely to d/c later today.     If plan is discharge home, recommend the following: Assistance with cooking/housework;Assist for transportation;Help with stairs or ramp for entrance;A little help with walking and/or transfers;A little help with bathing/dressing/bathroom   Can travel by private vehicle        Equipment Recommendations  None recommended by PT    Recommendations for Other Services       Precautions / Restrictions Precautions Precautions: Fall     Mobility  Bed Mobility               General bed mobility comments: pt sitting at bottom of bed on arrival    Transfers Overall transfer level: Needs assistance Equipment used: Rolling walker (2 wheels) Transfers: Sit to/from Stand Sit to Stand: Min assist           General transfer comment: multimodal cues for safe technique; pt unable to rise without assist, assist to power up    Ambulation/Gait Ambulation/Gait assistance: Min assist, Contact guard assist Gait Distance (Feet): 12 Feet Assistive device: Rolling walker (2 wheels) Gait Pattern/deviations: Step-through pattern, Trunk flexed, Decreased stride length, Shuffle Gait velocity: decr     General Gait Details: increased reliance on UE through RW for  support; 1-2 instances of min assist for stabilizing; distance limited by pt and pain   Stairs             Wheelchair Mobility     Tilt Bed    Modified Rankin (Stroke Patients Only)       Balance Overall balance assessment: Mild deficits observed, not formally tested                                          Communication Communication Communication: No apparent difficulties  Cognition Arousal: Alert Behavior During Therapy: WFL for tasks assessed/performed   PT - Cognitive impairments: Problem solving, Safety/Judgement, Awareness, Attention                       PT - Cognition Comments: presents with some cognitive impairments as above; RN reports pt confused Following commands: Intact      Cueing Cueing Techniques: Verbal cues  Exercises      General Comments        Pertinent Vitals/Pain Pain Assessment Pain Assessment: Faces Faces Pain Scale: Hurts a little bit Pain Location: pt reported no pain to PT however then a minute later when RN asked, he stated 8/10 Pain Intervention(s): Repositioned, Monitored during session (RN aware)    Home Living  Prior Function            PT Goals (current goals can now be found in the care plan section) Acute Rehab PT Goals PT Goal Formulation: With patient Time For Goal Achievement: 09/16/23 Potential to Achieve Goals: Good Progress towards PT goals: Progressing toward goals    Frequency    Min 3X/week      PT Plan      Co-evaluation              AM-PAC PT 6 Clicks Mobility   Outcome Measure  Help needed turning from your back to your side while in a flat bed without using bedrails?: None Help needed moving from lying on your back to sitting on the side of a flat bed without using bedrails?: None Help needed moving to and from a bed to a chair (including a wheelchair)?: A Little Help needed standing up from a chair using your  arms (e.g., wheelchair or bedside chair)?: A Little Help needed to walk in hospital room?: A Little Help needed climbing 3-5 steps with a railing? : A Little 6 Click Score: 20    End of Session Equipment Utilized During Treatment: Gait belt Activity Tolerance: Patient tolerated treatment well Patient left: with call bell/phone within reach;in chair (RN obtained chair alarm since pt has been confused) Nurse Communication: Mobility status PT Visit Diagnosis: Difficulty in walking, not elsewhere classified (R26.2);Unsteadiness on feet (R26.81);Muscle weakness (generalized) (M62.81)     Time: 8842-8789 PT Time Calculation (min) (ACUTE ONLY): 13 min  Charges:    $Gait Training: 8-22 mins PT General Charges $$ ACUTE PT VISIT: 1 Visit                     Tari PT, DPT Physical Therapist Acute Rehabilitation Services Office: [phone]    Kati L Payson 09/02/2023, 1:17 PM

## 2023-09-02 NOTE — TOC Transition Note (Signed)
 Transition of Care [facility]) - Discharge Note   Patient Details  Name: Tyler Scott MRN: [id number] Date of Birth: Nov 10, 1955  Transition of Care [facility]) CM/SW Contact:  Sonda Manuella Quill, RN Phone Number: 09/02/2023, 12:15 PM   Clinical Narrative:    [facility] consulted for OP Pall care; per Kieran, RN pt is forgetfull; spoke w/ his wife [name] [phone]); she does not have agency of choice, and would like to review agencies; explained list obtained from [email or web address]; she verbalized and will review list; Mrs [name] says she will contact agency of choice when pt discharges; pt has HHPT/OT w/ [facility]; Jon at [facility] notified; no TOC needs.   Final next level of care: Home w Home Health Services Barriers to Discharge: No Barriers Identified   Patient Goals and CMS Choice Patient states their goals for this hospitalization and ongoing recovery are:: For pain to be better in order for him to move around   Choice offered to / list presented to : Patient [place] ownership interest in [facility].provided to::  (n/a)    Discharge Placement                       Discharge Plan and Services Additional resources added to the After Visit Summary for   In-house Referral: NA Discharge Planning Services: CM Consult Post Acute Care Choice: NA          DME Arranged: N/A (Per patient he has walker and refused hospital bed on last admit)         HH Arranged: PT, OT (Resume HH PT/ OT) HH Agency: Other - See comment (Active with [facility]) Date HH Agency Contacted: 08/30/23 Time HH Agency Contacted: 1213 Representative spoke with at [facility] Agency: Angie  Social Drivers of Health (SDOH) Interventions SDOH Screenings   Food Insecurity: No Food Insecurity (08/27/2023)  Housing: Low Risk  (08/27/2023)  Transportation Needs: No Transportation Needs (08/27/2023)  Utilities: Not At Risk (08/27/2023)  Social Connections: Moderately Isolated (08/27/2023)  Tobacco Use:  Low Risk  (08/27/2023)     Readmission Risk Interventions    08/30/2023   11:40 AM 08/07/2023    8:49 AM  Readmission Risk Prevention Plan  Transportation Screening Complete Complete  PCP or Specialist Appt within 5-7 Days  Complete  PCP or Specialist Appt within 3-5 Days Complete   Home Care Screening  Complete  Medication Review (RN CM)  Complete  HRI or Home Care Consult Complete   Social Work Consult for Recovery Care Planning/Counseling Complete   Palliative Care Screening Complete   Medication Review [employer]) Complete

## 2023-09-02 NOTE — Discharge Instructions (Addendum)
 Please take all prescribed medications exactly as instructed. Please consume a low carbohydrate diet Please continue to check your blood sugars three times daily prior to meals or as previously instructed by your outpatient provider. Please increase your physical activity as tolerated using an assistive device. A home health agency should be reaching out to you to establish care for physical therapy and palliative care services. Please maintain all outpatient follow-up appointments including follow-up with your primary care provider and the [place] [facility] for chemotherapy on 7/30 at 8:45AM Please return to the emergency department if you develop worsening pain, fevers in excess of 100.4 F, weakness or inability to tolerate oral intake.  [email or web address].gov for list of palliative care agencies

## 2023-09-02 NOTE — Assessment & Plan Note (Signed)
 Hematology recommended administration of 1 unit packed red blood cell transfusion on 7/22

## 2023-09-04 NOTE — Discharge Summary (Signed)
 Physician Discharge Summary   Patient: Tyler Scott MRN: [id number] DOB: September 01, 1955  Admit date:     08/27/2023  Discharge date: 09/02/2023  Discharge Physician: Zachary JINNY Ba   PCP: [name], MD   Recommendations at discharge:   Please take all prescribed medications exactly as instructed. Please consume a low carbohydrate diet Please continue to check your blood sugars three times daily prior to meals or as previously instructed by your outpatient provider. Please increase your physical activity as tolerated using an assistive device. A home health agency should be reaching out to you to establish care for physical therapy and palliative care services. Please maintain all outpatient follow-up appointments including follow-up with your primary care provider and the [facility] for chemotherapy on 7/30 at 8:45AM Please return to the emergency department if you develop worsening pain, fevers in excess of 100.4 F, weakness or inability to tolerate oral intake.  Discharge Diagnoses: Principal Problem:   Cancer-related breakthrough pain Active Problems:   Lymphoma, low grade (HCC)   CLL (chronic lymphocytic leukemia) (HCC)   Normocytic anemia   Essential hypertension   Controlled type 2 diabetes mellitus with retinopathy, with long-term current use of insulin  (HCC)   Hyperlipidemia   Chronic ITP (idiopathic thrombocytopenia) (HCC)   Hyponatremia   Palliative care encounter   High risk medication use   Medication management   Counseling and coordination of care   Cancer associated pain   Need for emotional support   Thrombocytopenia (HCC)   Anemia  Resolved Problems:   * No resolved hospital problems. *   Hospital Course: Tyler Scott is a 68 y.o. male with medical history significant of HTN, HLD, T2DM, known prior history of CLL, chronic ITP who presented to ED with intractable back pain leg pain.  Of note, patient was admitted  08/06/23-08/18/23 for low back pain. Had retroperitoneal LN biopsy on 08/09/23. Pathology showed low grade lymphoma. Needed 1 unit PRBC during this stay. Chemotherapy regimen with Bendamustine /rituximab  orders placed to start as outpatient along with weekly Nplate .  He had MRI of the lumbar spine done on 08/06/2023 which showed diffuse marrow disease likely due to lymphoma and repeat MRI on 08/28/2023 showed similar finding but slightly worsening.    Palliative care was consulted to facilitate goals of care discussion and to review pain medication regimen.  They recommended palliative care referral at time of discharge.  Dr. Onesimo was additionally consulted and felt that the best way for the patient to achieve pain control is to proceed to chemotherapy as soon as possible.  Arrangements were made for outpatient chemotherapy the following week.  Throughout the hospitalization, adequate pain control was achieved with titration of opiate-based analgesics.  Been consulted and is following.  Patient is been titrated on a regimen of both oral and intravenous opiate-based analgesics.  Physical therapy additionally had evaluated the patient and recommend that the patient would benefit from skilled physical therapy services via home health therapy at time of discharge.  Patient was discharged home in improved and stable condition on 7/24 with his new prescription of opiate-based analgesics with home health physical therapy and palliative care referrals.       Consultants: Dr. Onesimo with Oncology, Dr. Jeryl with Palliative Care Procedures performed: none  Disposition: Home health Diet recommendation:  Discharge Diet Orders (From admission, onward)     Start     Ordered   09/02/23 0000  Diet - low sodium heart healthy  09/02/23 1144           Carb modified diet  DISCHARGE MEDICATION: Allergies as of 09/02/2023   No Known Allergies      Medication List     STOP taking these medications     oxyCODONE  5 MG immediate release tablet Commonly known as: Oxy IR/ROXICODONE        TAKE these medications    acyclovir  400 MG tablet Commonly known as: ZOVIRAX  Take 1 tablet (400 mg total) by mouth daily.   allopurinol  300 MG tablet Commonly known as: ZYLOPRIM  Take 0.5 tablets (150 mg total) by mouth daily.   amLODipine  10 MG tablet Commonly known as: NORVASC  Take 1 tablet (10 mg total) by mouth daily for 10 days.   Cholecalciferol 50 MCG (2000 UT) Tabs TAKE ONE TABLET BY MOUTH DAILY FOR LOW VITAMIN D.   dexamethasone  4 MG tablet Commonly known as: DECADRON  Take 8 mg by mouth daily. For 2 days after chemo   doxepin  25 MG capsule Commonly known as: SINEQUAN  Take 1 capsule by mouth at bedtime.   empagliflozin  25 MG Tabs tablet Commonly known as: JARDIANCE  Take 0.5 tablets by mouth every morning. Only if blood sugars get too high   finasteride  5 MG tablet Commonly known as: PROSCAR  Take 0.5 tablets by mouth daily.   gabapentin  100 MG capsule Commonly known as: NEURONTIN  Take 1 capsule (100 mg total) by mouth 3 (three) times daily.   HYDROmorphone  2 MG tablet Commonly known as: DILAUDID  Take 1 tablet (2 mg total) by mouth every 6 (six) hours as needed for severe pain (pain score 7-10).   insulin  glargine 100 UNIT/ML Solostar Pen Commonly known as: Lantus  SoloStar INJECT 20 UNITS SUBCUTANEOUSLY AT BEDTIME   methocarbamol  500 MG tablet Commonly known as: ROBAXIN  Take 2 tablets (1,000 mg total) by mouth every 8 (eight) hours as needed for muscle spasms.   morphine  15 MG 12 hr tablet Commonly known as: MS CONTIN  Take 1 tablet (15 mg total) by mouth every 12 (twelve) hours.   ondansetron  8 MG tablet Commonly known as: Zofran  Take 1 tablet (8 mg total) by mouth every 8 (eight) hours as needed for nausea or vomiting. Start on the third day after chemotherapy.   ONE TOUCH ULTRA 2 w/Device Kit See admin instructions.   ONE TOUCH ULTRA TEST test strip Generic  drug: glucose blood TEST GLUCOSE 3 TIMES A DAY   OneTouch Delica Lancets 33G Misc TEST GLUCOSE 3 TIMES A DAY   polyethylene glycol powder 17 GM/SCOOP powder Commonly known as: GLYCOLAX /MIRALAX  Take 17 g dissolved in liquid by mouth daily.   prochlorperazine  10 MG tablet Commonly known as: COMPAZINE  Take 1 tablet (10 mg total) by mouth every 6 (six) hours as needed for nausea or vomiting.   senna 8.6 MG Tabs tablet Commonly known as: SENOKOT Take 1 tablet (8.6 mg total) by mouth 2 (two) times daily.   simvastatin  20 MG tablet Commonly known as: ZOCOR  TAKE 1 TABLET (20 MG TOTAL) BY MOUTH AT BEDTIME.        Follow-up Information     [facility] Follow up.   Why: Your home health services with resume with [facility]. The office will call you with start of services.        [facility]. Go on 09/08/2023.   Why: 8:45AM - Chemotherapy appointment                Discharge Exam: Filed Weights   08/27/23 1609 08/27/23  2142 08/31/23 1115  Weight: 63.5 kg 62.7 kg 63.1 kg    Constitutional: Awake alert and oriented x3, no associated distress.   Respiratory: clear to auscultation bilaterally, no wheezing, no crackles. Normal respiratory effort. No accessory muscle use.  Cardiovascular: Regular rate and rhythm, no murmurs / rubs / gallops. No extremity edema. 2+ pedal pulses. No carotid bruits.  Abdomen: Abdomen is soft and nontender.  No evidence of intra-abdominal masses.  Positive bowel sounds noted in all quadrants.   Musculoskeletal: Pain with both passive and active range of motion in the proximal bilateral lower extremities.    Condition at discharge: fair  The results of significant diagnostics from this hospitalization (including imaging, microbiology, ancillary and laboratory) are listed below for reference.   Imaging Studies: MR Lumbar Spine W Wo Contrast Result Date: 08/28/2023 CLINICAL DATA:  68 year old male with diffusely abnormal  skeletal marrow, CLL, bulky retroperitoneal mass biopsied with CT guidance last month, pathology revealing small lymphocytic lymphoma. Back and leg pain. EXAM: MRI LUMBAR SPINE WITHOUT AND WITH CONTRAST TECHNIQUE: Multiplanar and multiecho pulse sequences of the lumbar spine were obtained without and with intravenous contrast. CONTRAST:  6mL GADAVIST  GADOBUTROL  1 MMOL/ML IV SOLN COMPARISON:  Lumbar MRI and CT Abdomen and Pelvis 08/06/2023. FINDINGS: Segmentation: Normal on the comparison CT, the same numbering system used on the MRI last month. Alignment:  Maintained lumbar lordosis. Vertebrae: Diffusely abnormal marrow signal throughout all visible skeletal structures from the lower chest through the pelvis. And indistinct margins of the bony cortex throughout, more consistent with infiltrative tumor than red marrow replacement. Maximal marrow heterogeneity is in the central sacrum series 7, image 8, stable from last month. Pronounced marrow heterogeneity also in the visible iliac wings. No pathologic fracture in the visible lumbar spine or sacrum. Conus medullaris and cauda equina: Conus extends to the L1 level. No lower spinal cord or conus signal abnormality. Enhancement. No abnormal dural thickening. Cauda equina nerve roots appear normal. No abnormal intradural Paraspinal and other soft tissues: Bulky and severe abdominal, retroperitoneal, prevertebral, and pelvic lymphadenopathy. Appearance not significantly changed from a CT Abdomen and Pelvis last month. Otherwise negative paraspinal soft tissues. Disc levels: Although subtle evidence of extraosseous tumor at some levels (e.g. L3 series 9, image 21). There is no malignant or degenerative lumbar spinal stenosis. No malignant neural foraminal stenosis. IMPRESSION: 1. Diffusely infiltrated marrow of the visible skeletal structures, most consistent with a diffuse skeletal lymphoma in this setting. No pathologic fracture. 2. Subtle evidence of early extraosseous  extension of disease at some levels. But no malignant spinal stenosis or neural impingement. 3. Bulky and severe lymphadenopathy throughout the visible abdomen and pelvis, not significantly changed from CT last month. Electronically Signed   By: VEAR Hurst M.D.   On: 08/28/2023 12:33   DG Pelvis 1-2 Views Result Date: 08/27/2023 CLINICAL DATA:  Low back pain EXAM: PELVIS - 1-2 VIEW COMPARISON:  None Available. FINDINGS: Right femur is incompletely included. Mild femoroacetabular degenerative change. Pubic symphysis and rami appear intact. No definitive fracture. IMPRESSION: Mild degenerative change. Note that the right femur/trochanter is incompletely included, follow-up dedicated right hip radiographs may be obtained if desired. Electronically Signed   By: Luke Bun M.D.   On: 08/27/2023 18:23   DG Lumbar Spine Complete Result Date: 08/27/2023 CLINICAL DATA:  Lower back pain radiating to the right side EXAM: LUMBAR SPINE - COMPLETE 5 VIEW COMPARISON:  CT abdomen and pelvis dated 08/06/2023 FINDINGS: There is no evidence of lumbar spine fracture.  Alignment is normal. Degenerative changes of the lumbar spine, most pronounced at L4-S1, where there is likely neural foraminal narrowing. IMPRESSION: Degenerative changes of the lumbar spine, most pronounced at L4-S1, where there is likely neural foraminal narrowing. Electronically Signed   By: Limin  Xu M.D.   On: 08/27/2023 18:23   CT BIOPSY Result Date: 08/09/2023 CLINICAL DATA:  Progressive retroperitoneal adenopathy, history of CLL EXAM: CT GUIDED CORE BIOPSY OF LEFT PARA-AORTIC ADENOPATHY ANESTHESIA/SEDATION: Intravenous Fentanyl  200mcg and Versed  4mg  were administered by RN during a total moderate (conscious) sedation time of 25 minutes; the patient's level of consciousness and physiological / cardiorespiratory status were monitored continuously by radiology RN under my direct supervision. PROCEDURE: The procedure risks, benefits, and alternatives were  explained to the patient. Questions regarding the procedure were encouraged and answered. The patient understands and consents to the procedure. patient placed prone. select axial scans through the abdomen obtained, adenopathy localized, and an appropriate skin entry site determined and marked. The operative field was prepped with chlorhexidinein a sterile fashion, and a sterile drape was applied covering the operative field. A sterile gown and sterile gloves were used for the procedure. Local anesthesia was provided with 1% Lidocaine . Under CT fluoroscopic guidance, a 17 gauge trocar needle was advanced to the margin of the lesion. Once needle tip position was confirmed, coaxial 18-gauge core biopsy samples were obtained, submitted in saline to surgical pathology. The guide needle was removed. Postprocedure scans show no hemorrhage or other apparent complication. The patient tolerated the procedure well. RADIATION DOSE REDUCTION: This exam was performed according to the departmental dose-optimization program which includes automated exposure control, adjustment of the mA and/or kV according to patient size and/or use of iterative reconstruction technique. COMPLICATIONS: None immediate FINDINGS: Bulky left para-aortic and aortocaval adenopathy localized. Representative core biopsy samples obtained as above. IMPRESSION: 1. Technically successful CT-guided core biopsy, left para-aortic retroperitoneal adenopathy. Electronically Signed   By: JONETTA Faes M.D.   On: 08/09/2023 15:52   ECHOCARDIOGRAM COMPLETE Result Date: 08/07/2023    ECHOCARDIOGRAM REPORT   Patient Name:   Tyler Scott Date of Exam: 08/07/2023 Medical Rec #:  [id number]           Height:       68.0 in Accession #:    [id number]          Weight:       145.0 lb Date of Birth:  May 05, 1955          BSA:          1.783 m Patient Age:    67 years            BP:           136/70 mmHg Patient Gender: M                   HR:           100 bpm. Exam  Location:  Inpatient Procedure: 2D Echo, Cardiac Doppler and Color Doppler (Both Spectral and Color            Flow Doppler were utilized during procedure). Indications:    Chemo  History:        Patient has no prior history of Echocardiogram examinations.                 Risk Factors:Diabetes.  Sonographer:    Benard Stallion Referring Phys: [id number] PETER R ENNEVER IMPRESSIONS  1. Left ventricular ejection fraction, by estimation, is  60 to 65%. The left ventricle has normal function. The left ventricle has no regional wall motion abnormalities. There is mild left ventricular hypertrophy. Left ventricular diastolic parameters are consistent with Grade I diastolic dysfunction (impaired relaxation).  2. Right ventricular systolic function is normal. The right ventricular size is normal.  3. The mitral valve is normal in structure. No evidence of mitral valve regurgitation. No evidence of mitral stenosis.  4. The tricuspid valve is abnormal. Tricuspid valve regurgitation is mild to moderate.  5. The aortic valve is tricuspid. Aortic valve regurgitation is not visualized. No aortic stenosis is present. FINDINGS  Left Ventricle: Left ventricular ejection fraction, by estimation, is 60 to 65%. The left ventricle has normal function. The left ventricle has no regional wall motion abnormalities. The left ventricular internal cavity size was normal in size. There is  mild left ventricular hypertrophy. Left ventricular diastolic parameters are consistent with Grade I diastolic dysfunction (impaired relaxation). Normal left ventricular filling pressure. Right Ventricle: IVC not visualized, not able to estimate PASP. The right ventricular size is normal. Right vetricular wall thickness was not well visualized. Right ventricular systolic function is normal. Left Atrium: Left atrial size was normal in size. Right Atrium: Right atrial size was normal in size. Pericardium: There is no evidence of pericardial effusion. Mitral Valve:  The mitral valve is normal in structure. There is mild thickening of the mitral valve leaflet(s). There is mild calcification of the mitral valve leaflet(s). Mild mitral annular calcification. No evidence of mitral valve regurgitation. No evidence of mitral valve stenosis. Tricuspid Valve: The tricuspid valve is abnormal. Tricuspid valve regurgitation is mild to moderate. No evidence of tricuspid stenosis. Aortic Valve: The aortic valve is tricuspid. Aortic valve regurgitation is not visualized. No aortic stenosis is present. Aortic valve mean gradient measures 4.0 mmHg. Aortic valve peak gradient measures 7.8 mmHg. Aortic valve area, by VTI measures 3.18 cm. Pulmonic Valve: The pulmonic valve was not well visualized. Pulmonic valve regurgitation is not visualized. No evidence of pulmonic stenosis. Aorta: The aortic root and ascending aorta are structurally normal, with no evidence of dilitation. IAS/Shunts: The interatrial septum was not well visualized.  LEFT VENTRICLE PLAX 2D LVIDd:         4.20 cm   Diastology LVIDs:         2.60 cm   LV e' medial:    7.29 cm/s LV PW:         1.10 cm   LV E/e' medial:  12.9 LV IVS:        1.10 cm   LV e' lateral:   7.51 cm/s LVOT diam:     2.20 cm   LV E/e' lateral: 12.5 LV SV:         74 LV SV Index:   41 LVOT Area:     3.80 cm  RIGHT VENTRICLE RV Basal diam:  3.55 cm RV Mid diam:    3.30 cm RV S prime:     14.40 cm/s TAPSE (M-mode): 3.0 cm LEFT ATRIUM             Index        RIGHT ATRIUM           Index LA diam:        3.20 cm 1.80 cm/m   RA Area:     13.40 cm LA Vol (A2C):   52.1 ml 29.23 ml/m  RA Volume:   34.40 ml  19.30 ml/m LA Vol (A4C):   28.9  ml 16.21 ml/m LA Biplane Vol: 38.6 ml 21.65 ml/m  AORTIC VALVE AV Area (Vmax):    3.18 cm AV Area (Vmean):   3.22 cm AV Area (VTI):     3.18 cm AV Vmax:           140.00 cm/s AV Vmean:          93.700 cm/s AV VTI:            0.232 m AV Peak Grad:      7.8 mmHg AV Mean Grad:      4.0 mmHg LVOT Vmax:         117.00 cm/s  LVOT Vmean:        79.300 cm/s LVOT VTI:          0.194 m LVOT/AV VTI ratio: 0.84  AORTA Ao Root diam: 3.40 cm Ao Asc diam:  3.00 cm MITRAL VALVE                TRICUSPID VALVE MV Area (PHT): 3.99 cm     TR Peak grad:   48.2 mmHg MV Decel Time: 190 msec     TR Vmax:        347.00 cm/s MV E velocity: 93.80 cm/s MV A velocity: 119.00 cm/s  SHUNTS MV E/A ratio:  0.79         Systemic VTI:  0.19 m                             Systemic Diam: 2.20 cm Dorn Ross MD Electronically signed by Dorn Ross MD Signature Date/Time: 08/07/2023/12:33:53 PM    Final    CT CHEST WO CONTRAST Result Date: 08/07/2023 CLINICAL DATA:  History of CLL.  Hematologic malignancy staging. EXAM: CT CHEST WITHOUT CONTRAST TECHNIQUE: Multidetector CT imaging of the chest was performed following the standard protocol without IV contrast. RADIATION DOSE REDUCTION: This exam was performed according to the departmental dose-optimization program which includes automated exposure control, adjustment of the mA and/or kV according to patient size and/or use of iterative reconstruction technique. COMPARISON:  CT abdomen and pelvis 08/06/2023.  PET-CT 03/05/2023 FINDINGS: Cardiovascular: Heart size is normal without significant pericardial fluid. Coronary artery calcifications. Atherosclerotic calcifications in thoracic aorta without aneurysm. Mediastinum/Nodes: Compared to the PET-CT from 03/05/2023, there has been enlargement lymph nodes throughout the chest. Nodal tissue in the left supraclavicular area adjacent to the left thyroid  lobe measures 3.2 cm in the short axis on image 17/2 and previously measured 2.7 cm in the short axis. Prominent nodal tissue along the right side of the mediastinum on image 38/2 measures 3.2 cm in the short axis and previously measured 2.9 cm. Index left axillary lymph node on image 36/2 measures 2.6 cm in the short axis previously measured 2.0 cm. This new or increased soft tissue along the posterior mediastinum  and paraspinal region on image 88/2 that measures 2.2 cm in the short axis. There is also soft tissue along the right side of the posterior mediastinum and paraspinal region. Enlarged lymph nodes in the cardiophrenic fat. Enlarged right internal mammary lymph nodes. Lungs/Pleura: Trachea and mainstem bronchi are patent. Trace left pleural fluid with new pleural-based thickening particularly along the paraspinal regions. Volume loss in the posterior lower lobes particularly on the left side. Subtle ground-glass opacities along the anterior right lung adjacent to the right internal mammary chain are nonspecific but favor atelectasis. No suspicious pulmonary nodules. Upper Abdomen: Extensive lymphadenopathy in the  upper abdomen. Spleen appears to be enlarged. Musculoskeletal: No acute bone abnormality. No suspicious osseous lesions. IMPRESSION: 1. Progression of the lymph node enlargement throughout the chest compared to the PET-CT from 03/05/2023. Diffuse lymphadenopathy throughout the chest as described. 2. Markedly increased nodal tissue and soft tissue thickening in the posterior mediastinum and paraspinal regions. 3. Volume loss in the posterior lower lobes particularly on the left side. Trace left pleural fluid. 4.  Aortic Atherosclerosis (ICD10-I70.0). Electronically Signed   By: Juliene Balder M.D.   On: 08/07/2023 09:28   MR LUMBAR SPINE WO CONTRAST Result Date: 08/06/2023 CLINICAL DATA:  Myelopathy, acute, lumbar spine. EXAM: MRI LUMBAR SPINE WITHOUT CONTRAST TECHNIQUE: Multiplanar, multisequence MR imaging of the lumbar spine was performed. No intravenous contrast was administered. COMPARISON:  None Available. FINDINGS: Segmentation:  Standard. Alignment:  No substantial sagittal subluxation. Vertebrae: Diffusely low T1 signal throughout the bone marrow with heterogeneous signal in the sacral vertebral bodies. No evidence of acute fracture or discitis/osteomyelitis. Conus medullaris and cauda equina: Conus  extends to the L1-L2 level. Conus and cauda equina appear normal. Paraspinal and other soft tissues: There appears to be intermediate extraosseous signal surrounding the vertebral bodies at multiple levels (for example see series 9, image 28 and series 9, image 20), possibly extraosseous extension of tumor but not well assessed without contrast. Please see same day CT of the abdomen/pelvis for description of retroperitoneal nodal mass and other intra-abdominal intrapelvic findings. Disc levels: T12-L1: No significant disc protrusion, foraminal stenosis, or canal stenosis. L1-L2: No significant disc protrusion, foraminal stenosis, or canal stenosis. L2-L3: No significant disc protrusion, foraminal stenosis, or canal stenosis. L3-L4: No significant disc protrusion, foraminal stenosis, or canal stenosis. L4-L5: Mild disc bulge and endplate spurring without significant stenosis. L5-S1: Question subtle intermediate signal along the posterior aspect of the L5 vertebral body which partially effaces ventral CSF. Mild disc bulging at the disc level without significant canal or foraminal stenosis. Sacral canal: Question mild intermediate signal in the ventral canal without compressive stenosis. IMPRESSION: 1. Diffusely abnormal marrow signal, likely related to the patient's underlying lymphoma. 2. Masslike extraosseous paraspinal signal abnormality at multiple levels, and L4 and subtle, equivacal signal in the ventral canal in the lower lumbar canal and sacral canal could potentially represent extraosseous extension of tumor but is incompletely assessed without contrast. If the patient is able, consider postcontrast imaging. 3. No compressive canal or foraminal stenosis. Electronically Signed   By: Gilmore GORMAN Molt M.D.   On: 08/06/2023 19:37   CT ABDOMEN PELVIS WO CONTRAST Result Date: 08/06/2023 CLINICAL DATA:  Chronic lymphocytic leukemia. Acute abdominal pain. * Tracking Code: BO * EXAM: CT ABDOMEN AND PELVIS WITHOUT  CONTRAST TECHNIQUE: Multidetector CT imaging of the abdomen and pelvis was performed following the standard protocol without IV contrast. RADIATION DOSE REDUCTION: This exam was performed according to the departmental dose-optimization program which includes automated exposure control, adjustment of the mA and/or kV according to patient size and/or use of iterative reconstruction technique. COMPARISON:  PET-CT 03/05/2023 FINDINGS: Lower chest: Thickened paraspinal tissue in the posterior mediastinum (image 8/2). Findings consistent lymphoma. Hepatobiliary: No focal hepatic lesion. Normal gallbladder. No biliary duct dilatation. Common bile duct is normal. Pancreas: Pancreas is normal. No ductal dilatation. No pancreatic inflammation. Spleen: Spleen is mildly enlarged at 14. Cm in craniocaudad dimension Adrenals/urinary tract: Adrenal glands and kidneys are normal. The ureters and bladder normal. Stomach/Bowel: Stomach, small bowel, appendix, and cecum are normal. The colon and rectosigmoid colon are normal. Vascular/Lymphatic: Bulky retroperitoneal lymphadenopathy  increased from comparison exam. Retroperitoneal nodal mass at the level of the lower pole LEFT kidney LEFT aorta measures 7.4 cm (image 46/2) compared to 5.1 cm comparison PET-CT scan. The aorta is elevated by 19 mm by retroperitoneal adenopathy compared to 8 mm on prior. Example node ventral to the aorta measuring 33 mm on image 52 compares to 23 mm. Bulky adenopathy extends along the iliac chains. LEFT external iliac lymph node anterior to the operator space measuring 22 mm compared to 13 mm. Minimal inguinal adenopathy. Reproductive: Prostate unremarkable Other: No free fluid. Musculoskeletal: No aggressive osseous lesion. Posterior fusion of the L4 spinous processes IMPRESSION: 1. Progression of lymphoma lymphadenopathy with increase in bulky retroperitoneal nodal mass. 2. Increase in perispinal thickening in the posterior mediastinum. 3. Mild  splenomegaly. 4. No bowel obstruction. Electronically Signed   By: Jackquline Boxer M.D.   On: 08/06/2023 18:20    Microbiology: Results for orders placed or performed during the hospital encounter of 08/06/23  Culture, blood (Routine X 2) w Reflex to ID Panel     Status: None   Collection Time: 08/07/23 12:44 AM   Specimen: BLOOD RIGHT ARM  Result Value Ref Range Status   Specimen Description   Final    BLOOD RIGHT ARM Performed at [facility], [address], [place], [place] [address]    Special Requests   Final    BOTTLES DRAWN AEROBIC AND ANAEROBIC Blood Culture adequate volume Performed at [facility], [address], [place], [place] [address]    Culture   Final    NO GROWTH 5 DAYS Performed at [facility], [address] [place], [place] [address]    Report Status 08/12/2023 FINAL  Final  Culture, blood (Routine X 2) w Reflex to ID Panel     Status: None   Collection Time: 08/07/23 12:44 AM   Specimen: BLOOD LEFT ARM  Result Value Ref Range Status   Specimen Description   Final    BLOOD LEFT ARM Performed at [facility], [address] [place], [place] [address]    Special Requests   Final    BOTTLES DRAWN AEROBIC AND ANAEROBIC Blood Culture adequate volume Performed at [facility], [address] [place], [place] [address]    Culture   Final    NO GROWTH 5 DAYS Performed at [facility], [address], [place], [place] [address]    Report Status 08/12/2023 FINAL  Final    Labs: CBC: Recent Labs  Lab 08/28/23 0851 08/29/23 0624 08/29/23 1012 08/30/23 0603 09/02/23 0614  WBC 22.1* 15.9* 12.9* 9.8 12.8*  NEUTROABS 3.6  --   --   --  1.9  HGB 8.8* 8.1* 7.9* 7.5* 8.6*  HCT 28.1* 25.2* 25.2* 23.5* 26.5*  MCV 98.3 98.1 97.7 97.1 94.6  PLT 82* 74* 75* 66* 61*   Basic Metabolic Panel: Recent Labs  Lab 08/28/23 1304 08/28/23 1706 08/29/23 0624 08/29/23 1012 08/30/23 0603  09/02/23 0614  NA 129* 132*  --  131* 130* 129*  K 5.0 4.5  --  4.7 4.2 4.9  CL 93* 94*  --  95* 94* 91*  CO2 21* 23  --  26 26 24   GLUCOSE 432* 234*  --  229* 184* 309*  BUN 35* 36*  --  32* 27* 24*  CREATININE 1.14 1.29*  --  1.02 1.12 0.92  CALCIUM 9.3 9.5  --  9.5 9.4 10.4*  MG  --   --  2.0  --   --  1.5*   Liver Function Tests: Recent Labs  Lab 08/28/23 1706 09/02/23 0614  AST 32 43*  ALT 18 22  ALKPHOS 117 202*  BILITOT 0.8 1.4*  PROT 7.8 7.5  ALBUMIN 3.6 3.3*   CBG: Recent Labs  Lab 09/01/23 1636 09/01/23 2105 09/02/23 0716 09/02/23 1138 09/02/23 1656  GLUCAP 280* 279* 289* 229* 186*    Discharge time spent: greater than 30 minutes.  Signed: Zachary JINNY Ba, MD [facility] 09/04/2023

## 2023-09-06 NOTE — Telephone Encounter (Signed)
 Oncology Pharmacist Encounter   Per MD note on 08/31/23 patient is going to be changing therapy plans to rituximab  + bendamustine  due to progressive disease. Patient never started venetoclax  due to infections and missed appointments.   I will sign off at this time. Prescription discontinued already.   Amoni Scallan, PharmD Hematology/Oncology Clinical Pharmacist [facility] [phone]

## 2023-09-07 ENCOUNTER — Inpatient Hospital Stay (HOSPITAL_BASED_OUTPATIENT_CLINIC_OR_DEPARTMENT_OTHER): Admitting: Nurse Practitioner

## 2023-09-07 ENCOUNTER — Encounter: Payer: Self-pay | Admitting: Nurse Practitioner

## 2023-09-07 VITALS — BP 130/60 | HR 118 | Temp 98.3°F | Resp 18 | Ht 68.0 in | Wt 125.6 lb

## 2023-09-07 DIAGNOSIS — R630 Anorexia: Secondary | ICD-10-CM

## 2023-09-07 DIAGNOSIS — Z7189 Other specified counseling: Secondary | ICD-10-CM | POA: Diagnosis not present

## 2023-09-07 DIAGNOSIS — G893 Neoplasm related pain (acute) (chronic): Secondary | ICD-10-CM

## 2023-09-07 DIAGNOSIS — Z515 Encounter for palliative care: Secondary | ICD-10-CM | POA: Diagnosis not present

## 2023-09-07 DIAGNOSIS — C8590 Non-Hodgkin lymphoma, unspecified, unspecified site: Secondary | ICD-10-CM

## 2023-09-07 DIAGNOSIS — K5903 Drug induced constipation: Secondary | ICD-10-CM

## 2023-09-07 DIAGNOSIS — C9110 Chronic lymphocytic leukemia of B-cell type not having achieved remission: Secondary | ICD-10-CM

## 2023-09-07 MED ORDER — HYDROMORPHONE HCL 2 MG PO TABS: 2.0000 mg | ORAL_TABLET | Freq: Four times a day (QID) | ORAL | 0 refills | Status: DC | PRN

## 2023-09-07 MED ORDER — HYDROMORPHONE HCL 2 MG PO TABS: 2.0000 mg | ORAL_TABLET | Freq: Four times a day (QID) | ORAL | 0 refills | Status: AC | PRN

## 2023-09-07 NOTE — Progress Notes (Signed)
 Palliative Medicine [facility]  [phone] [phone]   Name: Tyler Scott Date: 09/07/2023 MRN: [id number]  DOB: April 01, 1955  Patient Care Team: [name], MD as PCP - General (Family Medicine) Ethyl Bruckner FORBES, MD (Inactive)    REASON FOR CONSULTATION: Tyler Scott is a 68 y.o. male with history of CLL and with recent dx of low grade lymphoma.   Oncologic history per chart review/oncology notes:  Recent dx of lymphoma (07/2023). CLL diagnosed in 2012 after presenting with lymphocytosis and adenopathy and stage IIIA disease. In June 2025 hospitalized for weakness, back pain and decreased mobility. Workup completed and oncology suspecting transformation of CLL to aggressive lymphoma. Suspicion of bone marrow involvement of spine (MRI: masslike extraosseous paraspinal signal abnormality at multiple levels). S/P retroperitoneal LN biopsy on 6/30. path showed low-grade lymphoma.     status post several previous therapies for CLL including Bendamustine  and rituximab  (July 2017 and received total of 2 cycles. Therapy discontinued in August 2017 based on patient's wishes after achieving partial response). He developed a relapse disease in April 2019. Ibrutinib  420 mg daily started in April 2019 and discontinued in September 2023 after poor adherence. Most recently on Calquence  since 2023.     Patient was supposed to be on Gazyva , venetoclax . He had many interrupted cycles of Gazyva  due to No shows.  He was last seen in outpatient oncology on 07/06/23 at which time he was scheduled for C4D1 Gazyva  however was HELD due to infection/acute left sided epidydimo-orchitis for which he was on antibiotics.     Recent hospital admissions:   Admitted 08/06/23-08/18/23 for low back pain. Had retroperitoneal LN biopsy on 08/09/23. Pathology showed low grade lymphoma. Needed 1 unit PRBC during this stay. Chemotherapy regimen with  Bendamustine /rituximab  orders placed to start as outpatient along with weekly Nplate . He had MRI of the lumbar spine done on 08/06/2023 which showed diffuse marrow disease likely due to lymphoma and repeat MRI on 08/28/2023 showed similar finding but slightly worsening. Steroid taper completed.  Readmitted 08/27/2023 - 09/02/2023 - readmitted for intractable back/leg pain. Required adjustments of his pain regimen while inpatient (followed by palliative for pain medication adjustments). Also followed by oncology while inpatient and palliative chemo also recommended.  Plan is to start Rituxan  + Bendeka  7/30  Palliative is seeing patient for symptom management and goals of care.    SOCIAL HISTORY:     reports that he has never smoked. He has never used smokeless tobacco. He reports current alcohol use of about 4.0 standard drinks of alcohol per week. He reports that he does not use drugs.  ADVANCE DIRECTIVES:  Has thought about his advanced directives but has never completed documentation. He is interested in meeting with social work to complete this documentation. We did start to have a conversation about goals of care and advanced directives. We shared that while we are hopeful that he will start on chemo and have a good response to this therapy that he could get sicker and could get sick quickly. He becomes tearful and shares he has a hard time talking about this. He would like time to think about what his wishes are. Provided him with a copy of Hard Choices booklet which he plans to read as well as a blank MOST Form to review. Encouraged him to think about who his health surrogate would be if unable to speak for himself, what quality of life means for him, and trade offs. He states  he knows this is important and does not want to procrastinate too long on this so he will go home and consider his options. Encouraged him to call us  if he has questions.   CODE STATUS: Full code  PAST MEDICAL HISTORY: Past  Medical History:  Diagnosis Date   Allergy    Arthritis    CLL (chronic lymphocytic leukemia) (HCC)    stage III: asymptomatic; on observation   DIABETES MELLITUS, TYPE I, UNCONTROLLED 12/30/2006   insulin    Diabetic retinopathy    ERECTILE DYSFUNCTION, ORGANIC 12/30/2006   Hx of adenomatous polyp of colon 05/24/2014   HYPERLIPIDEMIA 12/30/2006   HYPERTENSION 12/30/2006   LOW BACK PAIN, ACUTE 12/21/2006    PAST SURGICAL HISTORY:  Past Surgical History:  Procedure Laterality Date   COLONOSCOPY     great right toe- Fused the I P joint      HEMATOLOGY/ONCOLOGY HISTORY:  Oncology History  CLL (chronic lymphocytic leukemia) (HCC)  12/20/2010 Initial Diagnosis   CLL (chronic lymphocytic leukemia) (HCC)   04/05/2023 - 06/08/2023 Chemotherapy   Patient is on Treatment Plan : LYMPHOMA CLL/SLL Venetoclax  + Obinutuzumab  q28d     06/14/2023 Cancer Staging   Staging form: Chronic Lymphocytic Leukemia / Small Lymphocytic Lymphoma, AJCC 8th Edition - Clinical: Modified Rai Stage IV (Modified Rai risk: High, Lymphocytosis: Present, Adenopathy: Present, Organomegaly: Present, Anemia: Present, Thrombocytopenia: Present) - Signed by Onesimo Emaline Brink, MD on 06/14/2023 Stage prefix: Recurrence   09/08/2023 -  Chemotherapy   Patient is on Treatment Plan : NON-HODGKINS LYMPHOMA Rituximab  D1 + Bendamustine  D1,2 q28d x 6 cycles       ALLERGIES:  has no known allergies.  MEDICATIONS:  Current Outpatient Medications  Medication Sig Dispense Refill   acyclovir  (ZOVIRAX ) 400 MG tablet Take 1 tablet (400 mg total) by mouth daily. 30 tablet 3   allopurinol  (ZYLOPRIM ) 300 MG tablet Take 0.5 tablets (150 mg total) by mouth daily. 30 tablet 3   amLODipine  (NORVASC ) 10 MG tablet Take 1 tablet (10 mg total) by mouth daily for 10 days. 30 tablet 0   Blood Glucose Monitoring Suppl (ONE TOUCH ULTRA 2) W/DEVICE KIT See admin instructions.  0   Cholecalciferol 50 MCG (2000 UT) TABS TAKE ONE TABLET BY MOUTH  DAILY FOR LOW VITAMIN D.     dexamethasone  (DECADRON ) 4 MG tablet Take 8 mg by mouth daily. For 2 days after chemo     doxepin  (SINEQUAN ) 25 MG capsule Take 1 capsule by mouth at bedtime.     empagliflozin  (JARDIANCE ) 25 MG TABS tablet Take 0.5 tablets by mouth every morning. Only if blood sugars get too high     finasteride  (PROSCAR ) 5 MG tablet Take 0.5 tablets by mouth daily.     gabapentin  (NEURONTIN ) 100 MG capsule Take 1 capsule (100 mg total) by mouth 3 (three) times daily. 90 capsule 1   HYDROmorphone  (DILAUDID ) 2 MG tablet Take 1 tablet (2 mg total) by mouth every 6 (six) hours as needed for severe pain (pain score 7-10). 30 tablet 0   Insulin  Glargine (LANTUS  SOLOSTAR) 100 UNIT/ML Solostar Pen INJECT 20 UNITS SUBCUTANEOUSLY AT BEDTIME 3 pen 11   methocarbamol  (ROBAXIN ) 500 MG tablet Take 2 tablets (1,000 mg total) by mouth every 8 (eight) hours as needed for muscle spasms. 60 tablet 1   morphine  (MS CONTIN ) 15 MG 12 hr tablet Take 1 tablet (15 mg total) by mouth every 12 (twelve) hours. 60 tablet 0   ondansetron  (ZOFRAN ) 8 MG tablet Take  1 tablet (8 mg total) by mouth every 8 (eight) hours as needed for nausea or vomiting. Start on the third day after chemotherapy. 30 tablet 1   ONE TOUCH ULTRA TEST test strip TEST GLUCOSE 3 TIMES A DAY 300 each 1   ONETOUCH DELICA LANCETS 33G MISC TEST GLUCOSE 3 TIMES A DAY 300 each 1   polyethylene glycol powder (GLYCOLAX /MIRALAX ) 17 GM/SCOOP powder Take 17 g dissolved in liquid by mouth daily. 476 g 2   prochlorperazine  (COMPAZINE ) 10 MG tablet Take 1 tablet (10 mg total) by mouth every 6 (six) hours as needed for nausea or vomiting. 30 tablet 1   senna (SENOKOT) 8.6 MG TABS tablet Take 1 tablet (8.6 mg total) by mouth 2 (two) times daily. 60 tablet 2   simvastatin  (ZOCOR ) 20 MG tablet TAKE 1 TABLET (20 MG TOTAL) BY MOUTH AT BEDTIME. 100 tablet 4   No current facility-administered medications for this visit.    VITAL SIGNS: BP 130/60 (BP Location:  Left Arm, Patient Position: Sitting)   Pulse (!) 118   Temp 98.3 F (36.8 C) (Temporal)   Resp 18   Ht 5' 8 (1.727 m)   Wt 125 lb 9.6 oz (57 kg)   SpO2 100%   BMI 19.10 kg/m  Filed Weights   09/07/23 1422  Weight: 125 lb 9.6 oz (57 kg)    Estimated body mass index is 19.1 kg/m as calculated from the following:   Height as of this encounter: 5' 8 (1.727 m).   Weight as of this encounter: 125 lb 9.6 oz (57 kg).  LABS: CBC:    Component Value Date/Time   WBC 12.8 (H) 09/02/2023 0614   HGB 8.6 (L) 09/02/2023 0614   HGB 8.1 (L) 08/24/2023 1329   HGB 12.8 (L) 01/01/2017 0809   HCT 26.5 (L) 09/02/2023 0614   HCT 38.5 01/01/2017 0809   PLT 61 (L) 09/02/2023 0614   PLT 67 (L) 08/24/2023 1329   PLT 224 01/01/2017 0809   MCV 94.6 09/02/2023 0614   MCV 94.7 01/01/2017 0809   NEUTROABS 1.9 09/02/2023 0614   NEUTROABS 4.5 01/01/2017 0809   LYMPHSABS 10.3 (H) 09/02/2023 0614   LYMPHSABS 32.4 (H) 01/01/2017 0809   MONOABS 0.5 09/02/2023 0614   MONOABS 0.9 01/01/2017 0809   EOSABS 0.0 09/02/2023 0614   EOSABS 0.4 01/01/2017 0809   BASOSABS 0.1 09/02/2023 0614   BASOSABS 0.0 01/01/2017 0809   Comprehensive Metabolic Panel:    Component Value Date/Time   NA 129 (L) 09/02/2023 0614   NA 142 01/01/2017 0809   K 4.9 09/02/2023 0614   K 4.4 01/01/2017 0809   CL 91 (L) 09/02/2023 0614   CL 102 04/25/2012 1003   CO2 24 09/02/2023 0614   CO2 26 01/01/2017 0809   BUN 24 (H) 09/02/2023 0614   BUN 15.7 01/01/2017 0809   CREATININE 0.92 09/02/2023 0614   CREATININE 0.89 08/24/2023 1329   CREATININE 0.9 01/01/2017 0809   GLUCOSE 309 (H) 09/02/2023 0614   GLUCOSE 89 01/01/2017 0809   GLUCOSE 170 (H) 04/25/2012 1003   CALCIUM 10.4 (H) 09/02/2023 0614   CALCIUM 9.1 01/01/2017 0809   AST 43 (H) 09/02/2023 0614   AST 17 08/24/2023 1329   AST 22 01/01/2017 0809   ALT 22 09/02/2023 0614   ALT 11 08/24/2023 1329   ALT 19 01/01/2017 0809   ALKPHOS 202 (H) 09/02/2023 0614   ALKPHOS  68 01/01/2017 0809   BILITOT 1.4 (H) 09/02/2023 9385  BILITOT 0.6 08/24/2023 1329   BILITOT 0.75 01/01/2017 0809   PROT 7.5 09/02/2023 0614   PROT 7.0 01/01/2017 0809   ALBUMIN 3.3 (L) 09/02/2023 0614   ALBUMIN 4.2 01/01/2017 0809    RADIOGRAPHIC STUDIES: MR Lumbar Spine W Wo Contrast Result Date: 08/28/2023 CLINICAL DATA:  68 year old male with diffusely abnormal skeletal marrow, CLL, bulky retroperitoneal mass biopsied with CT guidance last month, pathology revealing small lymphocytic lymphoma. Back and leg pain. EXAM: MRI LUMBAR SPINE WITHOUT AND WITH CONTRAST TECHNIQUE: Multiplanar and multiecho pulse sequences of the lumbar spine were obtained without and with intravenous contrast. CONTRAST:  6mL GADAVIST  GADOBUTROL  1 MMOL/ML IV SOLN COMPARISON:  Lumbar MRI and CT Abdomen and Pelvis 08/06/2023. FINDINGS: Segmentation: Normal on the comparison CT, the same numbering system used on the MRI last month. Alignment:  Maintained lumbar lordosis. Vertebrae: Diffusely abnormal marrow signal throughout all visible skeletal structures from the lower chest through the pelvis. And indistinct margins of the bony cortex throughout, more consistent with infiltrative tumor than red marrow replacement. Maximal marrow heterogeneity is in the central sacrum series 7, image 8, stable from last month. Pronounced marrow heterogeneity also in the visible iliac wings. No pathologic fracture in the visible lumbar spine or sacrum. Conus medullaris and cauda equina: Conus extends to the L1 level. No lower spinal cord or conus signal abnormality. Enhancement. No abnormal dural thickening. Cauda equina nerve roots appear normal. No abnormal intradural Paraspinal and other soft tissues: Bulky and severe abdominal, retroperitoneal, prevertebral, and pelvic lymphadenopathy. Appearance not significantly changed from a CT Abdomen and Pelvis last month. Otherwise negative paraspinal soft tissues. Disc levels: Although subtle evidence  of extraosseous tumor at some levels (e.g. L3 series 9, image 21). There is no malignant or degenerative lumbar spinal stenosis. No malignant neural foraminal stenosis. IMPRESSION: 1. Diffusely infiltrated marrow of the visible skeletal structures, most consistent with a diffuse skeletal lymphoma in this setting. No pathologic fracture. 2. Subtle evidence of early extraosseous extension of disease at some levels. But no malignant spinal stenosis or neural impingement. 3. Bulky and severe lymphadenopathy throughout the visible abdomen and pelvis, not significantly changed from CT last month. Electronically Signed   By: VEAR Hurst M.D.   On: 08/28/2023 12:33   DG Pelvis 1-2 Views Result Date: 08/27/2023 CLINICAL DATA:  Low back pain EXAM: PELVIS - 1-2 VIEW COMPARISON:  None Available. FINDINGS: Right femur is incompletely included. Mild femoroacetabular degenerative change. Pubic symphysis and rami appear intact. No definitive fracture. IMPRESSION: Mild degenerative change. Note that the right femur/trochanter is incompletely included, follow-up dedicated right hip radiographs may be obtained if desired. Electronically Signed   By: Luke Bun M.D.   On: 08/27/2023 18:23   DG Lumbar Spine Complete Result Date: 08/27/2023 CLINICAL DATA:  Lower back pain radiating to the right side EXAM: LUMBAR SPINE - COMPLETE 5 VIEW COMPARISON:  CT abdomen and pelvis dated 08/06/2023 FINDINGS: There is no evidence of lumbar spine fracture. Alignment is normal. Degenerative changes of the lumbar spine, most pronounced at L4-S1, where there is likely neural foraminal narrowing. IMPRESSION: Degenerative changes of the lumbar spine, most pronounced at L4-S1, where there is likely neural foraminal narrowing. Electronically Signed   By: Limin  Xu M.D.   On: 08/27/2023 18:23   CT BIOPSY Result Date: 08/09/2023 CLINICAL DATA:  Progressive retroperitoneal adenopathy, history of CLL EXAM: CT GUIDED CORE BIOPSY OF LEFT PARA-AORTIC  ADENOPATHY ANESTHESIA/SEDATION: Intravenous Fentanyl  200mcg and Versed  4mg  were administered by RN during a total moderate (  conscious) sedation time of 25 minutes; the patient's level of consciousness and physiological / cardiorespiratory status were monitored continuously by radiology RN under my direct supervision. PROCEDURE: The procedure risks, benefits, and alternatives were explained to the patient. Questions regarding the procedure were encouraged and answered. The patient understands and consents to the procedure. patient placed prone. select axial scans through the abdomen obtained, adenopathy localized, and an appropriate skin entry site determined and marked. The operative field was prepped with chlorhexidinein a sterile fashion, and a sterile drape was applied covering the operative field. A sterile gown and sterile gloves were used for the procedure. Local anesthesia was provided with 1% Lidocaine . Under CT fluoroscopic guidance, a 17 gauge trocar needle was advanced to the margin of the lesion. Once needle tip position was confirmed, coaxial 18-gauge core biopsy samples were obtained, submitted in saline to surgical pathology. The guide needle was removed. Postprocedure scans show no hemorrhage or other apparent complication. The patient tolerated the procedure well. RADIATION DOSE REDUCTION: This exam was performed according to the departmental dose-optimization program which includes automated exposure control, adjustment of the mA and/or kV according to patient size and/or use of iterative reconstruction technique. COMPLICATIONS: None immediate FINDINGS: Bulky left para-aortic and aortocaval adenopathy localized. Representative core biopsy samples obtained as above. IMPRESSION: 1. Technically successful CT-guided core biopsy, left para-aortic retroperitoneal adenopathy. Electronically Signed   By: JONETTA Faes M.D.   On: 08/09/2023 15:52    PERFORMANCE STATUS (ECOG) : 2 - Symptomatic, <50% confined  to bed  Unless otherwise noted, a complete review of systems is negative.  Physical Exam Constitutional:      General: He is not in acute distress.    Appearance: He is not toxic-appearing.     Comments: Appears thin, unable to sit still for long periods without shifting to try and get comfortable  Pulmonary:     Effort: Pulmonary effort is normal. No respiratory distress.  Skin:    General: Skin is warm and dry.     Comments: Small friction injury noted to gluteal cleft, no s/s of infection at the site, area is blanchable  Neurological:     Mental Status: He is alert.    IMPRESSION:  I introduced myself, Nikki NP, Maygan RN, and Palliative's role in collaboration with the oncology team. Concept of Palliative Care was introduced as specialized medical care for people and their families living with serious illness.  It focuses on providing relief from the symptoms and stress of a serious illness.  The goal is to improve quality of life for both the patient and the family. Values and goals of care important to patient and family were attempted to be elicited.  Discussed the use of AI scribe software for clinical note transcription with the patient, who gave verbal consent to proceed.  History of Present Illness Tyler Scott is a 68 year old male with cancer with history of CLL recent dx of low grade lymphoma (onc felt this was a conversion to this). Being seen for symptom management and GOC. He is accompanied by his wife.  He has an area of skin breakdown on his bottom, described as a raw spot in a split, long shape. He has been largely immobile recently and has been sliding up and down in the bed so not sure if this caused the issue.   He has recently been hospitalized for intractable pain. His pain management regimen includes MS Contin  15 mg twice daily and Dilaudid  as needed, along  with gabapentin  100 mg three times a day. Despite this, he experiences episodes of sharp,  unpredictable pain described as 'sharp pain followed by lpins and needles sensation.' These episodes are random and not necessarily related to physical activity.  He has been undergoing physical therapy both in the hospital and at home but had to return to the hospital after the first session due to severe pain. He is currently not receiving physical therapy due to upcoming chemotherapy treatments.  His bowel movements have improved with the use of Miralax  and Senna, keeping him more regular than before.   He reports a decreased appetite and significant weight loss, from 139 lbs to 125 lbs over a short period. He is trying to maintain his nutrition with small, frequent meals and nutritional supplements like Boost and Ensure.  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 He lives with his wife, and he is managing his care together, although she also has health issues that limit her ability to assist him physically.   Denies trouble with breathing.   We discussed his recent change in health status including his new cancer diagnosis current illness and what it means in the larger context of his overall health and on-going co-morbidities. Natural disease trajectory and expectations were discussed.  I discussed the importance of continued conversation with family and their medical providers regarding overall plan of care and treatment options, ensuring decisions are within the context of the patients values and GOCs.  Established therapeutic relationship. Education provided on palliative's role in collaboration with their Oncology/Radiation team. Assessment & Plan Longstanding history of CLL with recent hospitalization where he was diagnosed with low grade lymphoma. Undergoing palliative chemotherapy to manage symptoms and improve quality of life. Educated that symptoms may worsen before improving due to chemotherapy effects. - Continue palliative  chemotherapy - Coordinate with oncology team for symptom management - Provide education on palliative care services  Friction injury to gluteal cleft - Apply barrier cream such as Bordeaux's butt paste or Desitin - Educate on gentle cleaning without scrubbing - Encourage repositioning to relieve pressure - Consider use of a donut cushion or egg crate foam for pressure relief - Offered to order a hospital bed with pressure reliving mattress, he does not want one right now, will think about it  Neoplasm-related pain with neuropathic features Severe pain with neuropathic features related to neoplasm. Pain episodes are unpredictable, including sharp pains and pins and needles sensation. Current regimen includes MS Contin , Dilaudid , and gabapentin . - Increase gabapentin  to 200 mg at night (current regimen Gabapentin  100 mg PO BID and 200 mg nightly) - Continue MS Contin  and Dilaudid  as prescribed - Monitor pain levels and adjust medications as needed  Unintentional weight loss and anorexia Significant unintentional weight loss and decreased appetite. Current weight is 125 lbs, down from 139 lbs. Difficulty maintaining nutritional intake due to lack of appetite. - Encourage small, frequent meals - Use meal supplements like Boost or Ensure - Schedule appointment with dietitian on August 27 - Incorporate high-protein, high-fat foods into diet  Opioid-induced constipation, improved Opioid-induced constipation has improved with current bowel regimen. Regular bowel movements reported. - Continue current bowel regimen with Miralax  and Senna  Impaired mobility and functional decline Impaired mobility and functional decline due to cancer involving the spine, recent hospitalizations, and deconditioning. Difficulty with ambulation and reliance on support for movement (uses wheelchair for long distances or walker/wall walks otherwise). Physical therapy sessions limited  due to chemotherapy schedule. -  Encourage use of assistive devices for mobility - Promote self-care activities to maintain function - Consider hospital bed for easier mobility and pressure relief  Patient expressed understanding and was in agreement with this plan. He also understands that He can call the clinic at any time with any questions, concerns, or complaints.   Thank you for your referral and allowing Palliative to assist in Mr .Baldwin Racicot Cerney's care.   Number and complexity of problems addressed: HIGH - 1 or more chronic illnesses with SEVERE exacerbation, progression, or side effects of treatment - advanced cancer, pain. Any controlled substances utilized were prescribed in the context of palliative care.  Visit consisted of counseling and education dealing with the complex and emotionally intense issues of symptom management and palliative care in the setting of serious and potentially life-threatening illness.  Signed by: Laymon Pinal, DNP, AGNP-c, [facility] Levon Borer, AGPCNP-BC Palliative Medicine Team/[facility]

## 2023-09-07 NOTE — Progress Notes (Signed)
 HEMATOLOGY/ONCOLOGY CLINIC NOTE  Date of Service: 09/08/2023  Patient Care Team: Tyler Bruckner, MD as PCP - General (Family Medicine) Tyler Scott BRAVO, MD (Inactive)  CHIEF COMPLAINTS/PURPOSE OF CONSULTATION:  Evaluation and management of CLL  DIAGNOSIS: 68 year old man with CLL diagnosed in 2012 after presenting with lymphocytosis and adenopathy and stage IIIA disease.   Prior therapy:    Bendamustine  and rituximab  started in July 2017 and received total of 2 cycles. Therapy discontinued in August 2017 based on patient's wishes after achieving partial response.   He developed a relapse disease in April 2019. Ibrutinib  420 mg daily started in April 2019 and discontinued in September 2023 after poor adherence.   CURRENT THERAPY: Calquence  100 mg twice daily started in September 2023.  HISTORY OF PRESENTING ILLNESS:  Tyler Scott is a wonderful 68 y.o. male who has been a previous patient of Dr. Amadeo. He is here for evaluation and management of CLL.   Patient was last seen by Dr. Amadeo on 02/18/2022 and was doing well overall with no new medical concerns.  Today, he reports that he has been on and off different treatment since 2017, but has been on Calquence  since September, 2023. He does admit that he does forget to take this medication a couple times a week. He reports that his previous treatment of Ibrutinib  did cause him diarrhea.  He did receive two previous injection in his buttocks, but is unsure it is was steroids or antibiotics. Patient is UTD with influenza, RSV, and COVID-19 vaccinations. He denies any abdominal pain, but does note some discomfort along the spleen. He denies any bleeding issues, chest pain, diarrhea, resolved lumps/bumps in neck, infection issues, fevers, or chills.  He does note some SOB related to allergies and chest/sinus congestion. His symptoms are improving. He also reports that his neck edema had improved. He does complain of  sleep deprivation/poor sleep habits and notes some stress. He does typically sleep during the day.  He does take vitamin D and C supplements regularly. On average, he does drink 1 glass of wine a day and denies any major medication changes.  INTERVAL HISTORY:  Tyler Scott is a wonderful 68 y.o. male here for continued evaluation and management of CLL. Patient is scheduled for cycle 1 day 1 of his BR treatment today.  Patient was last seen by me on 07/06/2023 and reported mild hematuria, dark urine, and mild testicular pain related to infection of the left testicle and left epididymis.  He reports having additional swelling in his right abdomen. There appears to be increased bulging in the abdominal wall. Patient denies any surgeries in the area, such as gallbladder surgery. He notes that his abdominal bulging still persists when laying flat in bed. Patient denies any falls.   In regards to physical therapy, patient notes having an initial physical therapy evaluation visit as well as an exercise visit. He has not had any other physical therapy visits yet.   He reports that he has been able to move around his home with a walker and notes that he tries to move around as much as he can.   Patient notes that he is taking ensure 1-2 times day.   He is on DM medication, which is unchanged.   Patient denies any testicular pain/swelling.   MEDICAL HISTORY:  Past Medical History:  Diagnosis Date   Allergy    Arthritis    CLL (chronic lymphocytic leukemia) (HCC)    stage III: asymptomatic; on  observation   DIABETES MELLITUS, TYPE I, UNCONTROLLED 12/30/2006   insulin    Diabetic retinopathy    ERECTILE DYSFUNCTION, ORGANIC 12/30/2006   Hx of adenomatous polyp of colon 05/24/2014   HYPERLIPIDEMIA 12/30/2006   HYPERTENSION 12/30/2006   LOW BACK PAIN, ACUTE 12/21/2006    SURGICAL HISTORY: Past Surgical History:  Procedure Laterality Date   COLONOSCOPY     great right toe- Fused the I  P joint      SOCIAL HISTORY: Social History   Socioeconomic History   Marital status: Married    Spouse name: Not on file   Number of children: Not on file   Years of education: Not on file   Highest education level: Not on file  Occupational History   Not on file  Tobacco Use   Smoking status: Never   Smokeless tobacco: Never  Vaping Use   Vaping status: Never Used  Substance and Sexual Activity   Alcohol use: Yes    Alcohol/week: 4.0 standard drinks of alcohol    Types: 4 Glasses of wine per week   Drug use: No   Sexual activity: Not on file    Comment: regular exercise - yes  Other Topics Concern   Not on file  Social History Narrative   Not on file   Social Drivers of Health   Financial Resource Strain: Not on file  Food Insecurity: No Food Insecurity (08/27/2023)   Hunger Vital Sign    Worried About Running Out of Food in the Last Year: Never true    Ran Out of Food in the Last Year: Never true  Transportation Needs: No Transportation Needs (08/27/2023)   PRAPARE - Administrator, Civil Service (Medical): No    Lack of Transportation (Non-Medical): No  Physical Activity: Not on file  Stress: Not on file  Social Connections: Moderately Isolated (08/27/2023)   Social Connection and Isolation Panel    Frequency of Communication with Friends and Family: More than three times a week    Frequency of Social Gatherings with Friends and Family: Once a week    Attends Religious Services: Never    Database administrator or Organizations: No    Attends Banker Meetings: Never    Marital Status: Married  Catering manager Violence: Not At Risk (08/27/2023)   Humiliation, Afraid, Rape, and Kick questionnaire    Fear of Current or Ex-Partner: No    Emotionally Abused: No    Physically Abused: No    Sexually Abused: No    FAMILY HISTORY: Family History  Problem Relation Age of Onset   Alcohol abuse Other    Colon cancer Brother 41       Died  20-Sep-2013   Diabetes Mellitus II Mother    Esophageal cancer Neg Hx    Rectal cancer Neg Hx    Stomach cancer Neg Hx     ALLERGIES:  has no known allergies.  MEDICATIONS:  Current Outpatient Medications  Medication Sig Dispense Refill   acyclovir  (ZOVIRAX ) 400 MG tablet Take 1 tablet (400 mg total) by mouth daily. 30 tablet 3   allopurinol  (ZYLOPRIM ) 300 MG tablet Take 0.5 tablets (150 mg total) by mouth daily. 30 tablet 3   amLODipine  (NORVASC ) 10 MG tablet Take 1 tablet (10 mg total) by mouth daily for 10 days. 30 tablet 0   Blood Glucose Monitoring Suppl (ONE TOUCH ULTRA 2) W/DEVICE KIT See admin instructions.  0   Cholecalciferol 50 MCG (2000 UT) TABS  TAKE ONE TABLET BY MOUTH DAILY FOR LOW VITAMIN D.     dexamethasone  (DECADRON ) 4 MG tablet Take 8 mg by mouth daily. For 2 days after chemo     doxepin  (SINEQUAN ) 25 MG capsule Take 1 capsule by mouth at bedtime.     empagliflozin  (JARDIANCE ) 25 MG TABS tablet Take 0.5 tablets by mouth every morning. Only if blood sugars get too high     finasteride  (PROSCAR ) 5 MG tablet Take 0.5 tablets by mouth daily.     gabapentin  (NEURONTIN ) 100 MG capsule Take 1 capsule (100 mg total) by mouth 3 (three) times daily. 90 capsule 1   [START ON 09/09/2023] HYDROmorphone  (DILAUDID ) 2 MG tablet Take 1 tablet (2 mg total) by mouth every 6 (six) hours as needed for up to 15 days for severe pain (pain score 7-10). 30 tablet 0   Insulin  Glargine (LANTUS  SOLOSTAR) 100 UNIT/ML Solostar Pen INJECT 20 UNITS SUBCUTANEOUSLY AT BEDTIME 3 pen 11   methocarbamol  (ROBAXIN ) 500 MG tablet Take 2 tablets (1,000 mg total) by mouth every 8 (eight) hours as needed for muscle spasms. 60 tablet 1   morphine  (MS CONTIN ) 15 MG 12 hr tablet Take 1 tablet (15 mg total) by mouth every 12 (twelve) hours. 60 tablet 0   ondansetron  (ZOFRAN ) 8 MG tablet Take 1 tablet (8 mg total) by mouth every 8 (eight) hours as needed for nausea or vomiting. Start on the third day after chemotherapy. 30  tablet 1   ONE TOUCH ULTRA TEST test strip TEST GLUCOSE 3 TIMES A DAY 300 each 1   ONETOUCH DELICA LANCETS 33G MISC TEST GLUCOSE 3 TIMES A DAY 300 each 1   polyethylene glycol powder (GLYCOLAX /MIRALAX ) 17 GM/SCOOP powder Take 17 g dissolved in liquid by mouth daily. 476 g 2   prochlorperazine  (COMPAZINE ) 10 MG tablet Take 1 tablet (10 mg total) by mouth every 6 (six) hours as needed for nausea or vomiting. 30 tablet 1   senna (SENOKOT) 8.6 MG TABS tablet Take 1 tablet (8.6 mg total) by mouth 2 (two) times daily. 60 tablet 2   simvastatin  (ZOCOR ) 20 MG tablet TAKE 1 TABLET (20 MG TOTAL) BY MOUTH AT BEDTIME. 100 tablet 4   No current facility-administered medications for this visit.    REVIEW OF SYSTEMS:    10 Point review of Systems was done is negative except as noted above.   PHYSICAL EXAMINATION: ECOG PERFORMANCE STATUS: 1 - Symptomatic but completely ambulatory .SABRAThere were no vitals taken for this visit.  GENERAL:alert, in no acute distress and comfortable SKIN: no acute rashes, no significant lesions EYES: conjunctiva are pink and non-injected, sclera anicteric OROPHARYNX: MMM, no exudates, no oropharyngeal erythema or ulceration NECK: supple, no JVD LYMPH:  no palpable lymphadenopathy in the cervical, axillary or inguinal regions LUNGS: clear to auscultation b/l with normal respiratory effort HEART: regular rate & rhythm ABDOMEN:  normoactive bowel sounds , non tender, not distended. Extremity: no pedal edema PSYCH: alert & oriented x 3 with fluent speech NEURO: no focal motor/sensory deficits   LABORATORY DATA:  I have reviewed the data as listed .    Latest Ref Rng & Units 09/08/2023    8:57 AM 09/02/2023    6:14 AM  CBC  WBC 4.0 - 10.5 K/uL 29.9  12.8   Hemoglobin 13.0 - 17.0 g/dL 8.3  8.6   Hematocrit 60.9 - 52.0 % 25.8  26.5   Platelets 150 - 400 K/uL 80  61    .CMP  Component Value Date/Time   NA 129 (L) 09/02/2023 0614   NA 142 01/01/2017 0809   K 4.9  09/02/2023 0614   K 4.4 01/01/2017 0809   CL 91 (L) 09/02/2023 0614   CL 102 04/25/2012 1003   CO2 24 09/02/2023 0614   CO2 26 01/01/2017 0809   GLUCOSE 309 (H) 09/02/2023 0614   GLUCOSE 89 01/01/2017 0809   GLUCOSE 170 (H) 04/25/2012 1003   BUN 24 (H) 09/02/2023 0614   BUN 15.7 01/01/2017 0809   CREATININE 0.92 09/02/2023 0614   CREATININE 0.89 08/24/2023 1329   CREATININE 0.9 01/01/2017 0809   CALCIUM 10.4 (H) 09/02/2023 0614   CALCIUM 9.1 01/01/2017 0809   PROT 7.5 09/02/2023 0614   PROT 7.0 01/01/2017 0809   ALBUMIN 3.3 (L) 09/02/2023 0614   ALBUMIN 4.2 01/01/2017 0809   AST 43 (H) 09/02/2023 0614   AST 17 08/24/2023 1329   AST 22 01/01/2017 0809   ALT 22 09/02/2023 0614   ALT 11 08/24/2023 1329   ALT 19 01/01/2017 0809   ALKPHOS 202 (H) 09/02/2023 0614   ALKPHOS 68 01/01/2017 0809   BILITOT 1.4 (H) 09/02/2023 0614   BILITOT 0.6 08/24/2023 1329   BILITOT 0.75 01/01/2017 0809   GFRNONAA >60 09/02/2023 0614   GFRNONAA >60 08/24/2023 1329   GFRAA >60 07/25/2019 0932   .    Latest Ref Rng & Units 09/08/2023    8:57 AM 09/02/2023    6:14 AM  CMP  Glucose 70 - 99 mg/dL 807  690   BUN 8 - 23 mg/dL 38  24   Creatinine 9.38 - 1.24 mg/dL 8.54  9.07   Sodium 864 - 145 mmol/L 135  129   Potassium 3.5 - 5.1 mmol/L 5.0  4.9   Chloride 98 - 111 mmol/L 97  91   CO2 22 - 32 mmol/L 30  24   Calcium 8.9 - 10.3 mg/dL 88.6  89.5   Total Protein 6.5 - 8.1 g/dL 7.5  7.5   Total Bilirubin 0.0 - 1.2 mg/dL 1.0  1.4   Alkaline Phos 38 - 126 U/L 134  202   AST 15 - 41 U/L 19  43   ALT 0 - 44 U/L 8  22    . RADIOGRAPHIC STUDIES: I have personally reviewed the radiological images as listed and agreed with the findings in the report. MR Lumbar Spine W Wo Contrast Result Date: 08/28/2023 CLINICAL DATA:  68 year old male with diffusely abnormal skeletal marrow, CLL, bulky retroperitoneal mass biopsied with CT guidance last month, pathology revealing small lymphocytic lymphoma. Back and  leg pain. EXAM: MRI LUMBAR SPINE WITHOUT AND WITH CONTRAST TECHNIQUE: Multiplanar and multiecho pulse sequences of the lumbar spine were obtained without and with intravenous contrast. CONTRAST:  6mL GADAVIST  GADOBUTROL  1 MMOL/ML IV SOLN COMPARISON:  Lumbar MRI and CT Abdomen and Pelvis 08/06/2023. FINDINGS: Segmentation: Normal on the comparison CT, the same numbering system used on the MRI last month. Alignment:  Maintained lumbar lordosis. Vertebrae: Diffusely abnormal marrow signal throughout all visible skeletal structures from the lower chest through the pelvis. And indistinct margins of the bony cortex throughout, more consistent with infiltrative tumor than red marrow replacement. Maximal marrow heterogeneity is in the central sacrum series 7, image 8, stable from last month. Pronounced marrow heterogeneity also in the visible iliac wings. No pathologic fracture in the visible lumbar spine or sacrum. Conus medullaris and cauda equina: Conus extends to the L1 level. No lower spinal cord or  conus signal abnormality. Enhancement. No abnormal dural thickening. Cauda equina nerve roots appear normal. No abnormal intradural Paraspinal and other soft tissues: Bulky and severe abdominal, retroperitoneal, prevertebral, and pelvic lymphadenopathy. Appearance not significantly changed from a CT Abdomen and Pelvis last month. Otherwise negative paraspinal soft tissues. Disc levels: Although subtle evidence of extraosseous tumor at some levels (e.g. L3 series 9, image 21). There is no malignant or degenerative lumbar spinal stenosis. No malignant neural foraminal stenosis. IMPRESSION: 1. Diffusely infiltrated marrow of the visible skeletal structures, most consistent with a diffuse skeletal lymphoma in this setting. No pathologic fracture. 2. Subtle evidence of early extraosseous extension of disease at some levels. But no malignant spinal stenosis or neural impingement. 3. Bulky and severe lymphadenopathy throughout the  visible abdomen and pelvis, not significantly changed from CT last month. Electronically Signed   By: VEAR Hurst M.D.   On: 08/28/2023 12:33   DG Pelvis 1-2 Views Result Date: 08/27/2023 CLINICAL DATA:  Low back pain EXAM: PELVIS - 1-2 VIEW COMPARISON:  None Available. FINDINGS: Right femur is incompletely included. Mild femoroacetabular degenerative change. Pubic symphysis and rami appear intact. No definitive fracture. IMPRESSION: Mild degenerative change. Note that the right femur/trochanter is incompletely included, follow-up dedicated right hip radiographs may be obtained if desired. Electronically Signed   By: Luke Bun M.D.   On: 08/27/2023 18:23   DG Lumbar Spine Complete Result Date: 08/27/2023 CLINICAL DATA:  Lower back pain radiating to the right side EXAM: LUMBAR SPINE - COMPLETE 5 VIEW COMPARISON:  CT abdomen and pelvis dated 08/06/2023 FINDINGS: There is no evidence of lumbar spine fracture. Alignment is normal. Degenerative changes of the lumbar spine, most pronounced at L4-S1, where there is likely neural foraminal narrowing. IMPRESSION: Degenerative changes of the lumbar spine, most pronounced at L4-S1, where there is likely neural foraminal narrowing. Electronically Signed   By: Limin  Xu M.D.   On: 08/27/2023 18:23   CT BIOPSY Result Date: 08/09/2023 CLINICAL DATA:  Progressive retroperitoneal adenopathy, history of CLL EXAM: CT GUIDED CORE BIOPSY OF LEFT PARA-AORTIC ADENOPATHY ANESTHESIA/SEDATION: Intravenous Fentanyl  200mcg and Versed  4mg  were administered by RN during a total moderate (conscious) sedation time of 25 minutes; the patient's level of consciousness and physiological / cardiorespiratory status were monitored continuously by radiology RN under my direct supervision. PROCEDURE: The procedure risks, benefits, and alternatives were explained to the patient. Questions regarding the procedure were encouraged and answered. The patient understands and consents to the procedure.  patient placed prone. select axial scans through the abdomen obtained, adenopathy localized, and an appropriate skin entry site determined and marked. The operative field was prepped with chlorhexidinein a sterile fashion, and a sterile drape was applied covering the operative field. A sterile gown and sterile gloves were used for the procedure. Local anesthesia was provided with 1% Lidocaine . Under CT fluoroscopic guidance, a 17 gauge trocar needle was advanced to the margin of the lesion. Once needle tip position was confirmed, coaxial 18-gauge core biopsy samples were obtained, submitted in saline to surgical pathology. The guide needle was removed. Postprocedure scans show no hemorrhage or other apparent complication. The patient tolerated the procedure well. RADIATION DOSE REDUCTION: This exam was performed according to the departmental dose-optimization program which includes automated exposure control, adjustment of the mA and/or kV according to patient size and/or use of iterative reconstruction technique. COMPLICATIONS: None immediate FINDINGS: Bulky left para-aortic and aortocaval adenopathy localized. Representative core biopsy samples obtained as above. IMPRESSION: 1. Technically successful CT-guided core biopsy, left  para-aortic retroperitoneal adenopathy. Electronically Signed   By: JONETTA Faes M.D.   On: 08/09/2023 15:52     ASSESSMENT & PLAN:   68 year old man with:      1.  CLL /SLL diagnosed in 2012 after presenting with stage IIIA, leukocytosis and lymphadenopathy.  He is currently on Calquence  with continued response in his white cell count continues to drop appropriately. His hemoglobin and platelet count has also nearly normalized at this time.   2.  Lymphadenopathy: Related to his lymphoproliferative disorder with cervical adenopathy.  This has nearly resolved at this time.   3.  Tumor lysis syndrome: He is at low risk at this time despite his bulky disease and will continue to  monitor his electrolytes for the time being.  Continues to be on allopurinol  for this.  4. Acute left sided epidydimo-orchitis -- on antibiotics -- improving. Mx by urology  PLAN:  -Discussed lab results on 09/08/2023 in detail with patient. CBC showed WBC of 29.9K, hemoglobin of 8.3, and platelets of 80K. -hgb decreased from 8.6 to 8.3 -platelets mildly improved from 61K to 80K -WBCs increasing from 12.8K to 29.9K -CMP suggests mild dehydration -calcium elevated at 11.3 mg/dL, which could be from dehydration or could be a result of lymphoma -patient noted to have abdominal bulging on his right side. I did not feel an immediate mass in his right abdomen on physical exam. It is possible that there could be some weakness in the abdominal wall. We will continue to monitor this.  -patient is appropriate to proceed with cycle 1 day 1 of treatment today with Bendamustine  and Rituxan  -discussed that he will receive only Bendamustine  tomorrow without Rituxan  -discussed that we will start chemoimmunotherapy at this time, and if his disease is controlled, we can try to switch back to oral treatment down the line if he chooses to -there is no indication for blood transfusion at this time -recommend patient to eat as well as he can to optimize his nutritional status -recommend taking Ensure nutritional supplement twice daily between meals -recommend staying well-hydrated with at least 2L of water daily  -discussed that there is a need to ensure that his WBCS remain stable after chemotherapy -will repeat labs in 1 week  -answered all of patient's questions in detail  FOLLOW-UP: Weekly labs and prn transfusion x 4, weekly Nplate  as needed for PLT<50k MD visit in 2 weeks for tox check  The total time spent in the appointment was 30 minutes* .  All of the patient's questions were answered with apparent satisfaction. The patient knows to call the clinic with any problems, questions or  concerns.   Emaline Saran MD MS AAHIVMS [facility] Hematology/Oncology Physician [facility]  .*Total Encounter Time as defined by the Centers for Medicare and Medicaid Services includes, in addition to the face-to-face time of a patient visit (documented in the note above) non-face-to-face time: obtaining and reviewing outside history, ordering and reviewing medications, tests or procedures, care coordination (communications with other health care professionals or caregivers) and documentation in the medical record.    I,Mitra Faeizi,acting as a Neurosurgeon for Emaline Saran, MD.,have documented all relevant documentation on the behalf of Emaline Saran, MD,as directed by  Emaline Saran, MD while in the presence of Emaline Saran, MD.  .I have reviewed the above documentation for accuracy and completeness, and I agree with the above. .Bedelia Pong Kishore Emmali Karow MD

## 2023-09-07 NOTE — Patient Instructions (Signed)
 Use barrier cream for the area on your bottom, do not scrub gently wipe to remove  Increase Gabapentin  from 1 tablet 3x/day to 1 tablet 2x/day and 2 tabs nightly  Will set up an appointment with social worker to complete Advanced Directives.  Call if you have any questions.

## 2023-09-07 NOTE — Addendum Note (Signed)
 Addended by: CLEOTILDE LAYMON HERO on: 09/07/2023 05:13 PM   Modules accepted: Orders

## 2023-09-08 ENCOUNTER — Other Ambulatory Visit: Payer: Self-pay | Admitting: Hematology

## 2023-09-08 ENCOUNTER — Inpatient Hospital Stay

## 2023-09-08 ENCOUNTER — Inpatient Hospital Stay (HOSPITAL_BASED_OUTPATIENT_CLINIC_OR_DEPARTMENT_OTHER): Admitting: Hematology

## 2023-09-08 ENCOUNTER — Encounter: Payer: Self-pay | Admitting: Hematology

## 2023-09-08 VITALS — BP 117/56 | HR 79 | Temp 97.6°F | Resp 16 | Wt 125.2 lb

## 2023-09-08 DIAGNOSIS — Z5111 Encounter for antineoplastic chemotherapy: Secondary | ICD-10-CM | POA: Diagnosis present

## 2023-09-08 DIAGNOSIS — D696 Thrombocytopenia, unspecified: Secondary | ICD-10-CM

## 2023-09-08 DIAGNOSIS — D649 Anemia, unspecified: Secondary | ICD-10-CM

## 2023-09-08 DIAGNOSIS — Z79624 Long term (current) use of inhibitors of nucleotide synthesis: Secondary | ICD-10-CM | POA: Diagnosis not present

## 2023-09-08 DIAGNOSIS — G893 Neoplasm related pain (acute) (chronic): Secondary | ICD-10-CM | POA: Diagnosis not present

## 2023-09-08 DIAGNOSIS — C8590 Non-Hodgkin lymphoma, unspecified, unspecified site: Secondary | ICD-10-CM | POA: Diagnosis present

## 2023-09-08 DIAGNOSIS — C9110 Chronic lymphocytic leukemia of B-cell type not having achieved remission: Secondary | ICD-10-CM

## 2023-09-08 DIAGNOSIS — D693 Immune thrombocytopenic purpura: Secondary | ICD-10-CM | POA: Diagnosis not present

## 2023-09-08 DIAGNOSIS — Z79899 Other long term (current) drug therapy: Secondary | ICD-10-CM | POA: Diagnosis not present

## 2023-09-08 DIAGNOSIS — Z5112 Encounter for antineoplastic immunotherapy: Secondary | ICD-10-CM | POA: Diagnosis present

## 2023-09-08 LAB — CMP (CANCER CENTER ONLY)
ALT: 8 U/L (ref 0–44)
AST: 19 U/L (ref 15–41)
Albumin: 3.5 g/dL (ref 3.5–5.0)
Alkaline Phosphatase: 134 U/L — ABNORMAL HIGH (ref 38–126)
Anion gap: 8 (ref 5–15)
BUN: 38 mg/dL — ABNORMAL HIGH (ref 8–23)
CO2: 30 mmol/L (ref 22–32)
Calcium: 11.3 mg/dL — ABNORMAL HIGH (ref 8.9–10.3)
Chloride: 97 mmol/L — ABNORMAL LOW (ref 98–111)
Creatinine: 1.45 mg/dL — ABNORMAL HIGH (ref 0.61–1.24)
GFR, Estimated: 53 mL/min — ABNORMAL LOW (ref >60)
Glucose, Bld: 192 mg/dL — ABNORMAL HIGH (ref 70–99)
Potassium: 5 mmol/L (ref 3.5–5.1)
Sodium: 135 mmol/L (ref 135–145)
Total Bilirubin: 1 mg/dL (ref 0.0–1.2)
Total Protein: 7.5 g/dL (ref 6.5–8.1)

## 2023-09-08 LAB — HEPATITIS B SURFACE ANTIBODY,QUALITATIVE: Hep B S Ab: REACTIVE — AB

## 2023-09-08 LAB — CBC WITH DIFFERENTIAL (CANCER CENTER ONLY)
Abs Immature Granulocytes: 0.16 K/uL — ABNORMAL HIGH (ref 0.00–0.07)
Basophils Absolute: 0.2 K/uL — ABNORMAL HIGH (ref 0.0–0.1)
Basophils Relative: 1 %
Eosinophils Absolute: 0 K/uL (ref 0.0–0.5)
Eosinophils Relative: 0 %
HCT: 25.8 % — ABNORMAL LOW (ref 39.0–52.0)
Hemoglobin: 8.3 g/dL — ABNORMAL LOW (ref 13.0–17.0)
Immature Granulocytes: 1 %
Lymphocytes Relative: 85 %
Lymphs Abs: 25.7 K/uL — ABNORMAL HIGH (ref 0.7–4.0)
MCH: 30.4 pg (ref 26.0–34.0)
MCHC: 32.2 g/dL (ref 30.0–36.0)
MCV: 94.5 fL (ref 80.0–100.0)
Monocytes Absolute: 1.4 K/uL — ABNORMAL HIGH (ref 0.1–1.0)
Monocytes Relative: 5 %
Neutro Abs: 2.5 K/uL (ref 1.7–7.7)
Neutrophils Relative %: 8 %
Platelet Count: 80 K/uL — ABNORMAL LOW (ref 150–400)
RBC: 2.73 MIL/uL — ABNORMAL LOW (ref 4.22–5.81)
RDW: 16.7 % — ABNORMAL HIGH (ref 11.5–15.5)
WBC Count: 29.9 K/uL — ABNORMAL HIGH (ref 4.0–10.5)
nRBC: 0.1 % (ref 0.0–0.2)

## 2023-09-08 LAB — HEPATITIS B SURFACE ANTIGEN: Hepatitis B Surface Ag: NONREACTIVE

## 2023-09-08 MED ORDER — SODIUM CHLORIDE 0.9 % IV SOLN
600.0000 mg | Freq: Once | INTRAVENOUS | Status: AC
  Administered 2023-09-08: 600 mg via INTRAVENOUS
  Filled 2023-09-08: qty 50

## 2023-09-08 MED ORDER — DEXAMETHASONE SODIUM PHOSPHATE 10 MG/ML IJ SOLN
10.0000 mg | Freq: Once | INTRAMUSCULAR | Status: AC
  Administered 2023-09-08: 10 mg via INTRAVENOUS
  Filled 2023-09-08: qty 1

## 2023-09-08 MED ORDER — PALONOSETRON HCL INJECTION 0.25 MG/5ML
0.2500 mg | Freq: Once | INTRAVENOUS | Status: AC
  Administered 2023-09-08: 0.25 mg via INTRAVENOUS
  Filled 2023-09-08: qty 5

## 2023-09-08 MED ORDER — DIPHENHYDRAMINE HCL 25 MG PO CAPS
50.0000 mg | ORAL_CAPSULE | Freq: Once | ORAL | Status: AC
Start: 2023-09-08 — End: 2023-09-08
  Administered 2023-09-08: 50 mg via ORAL
  Filled 2023-09-08: qty 2

## 2023-09-08 MED ORDER — ACETAMINOPHEN 325 MG PO TABS
650.0000 mg | ORAL_TABLET | Freq: Once | ORAL | Status: AC
  Administered 2023-09-08: 650 mg via ORAL
  Filled 2023-09-08: qty 2

## 2023-09-08 MED ORDER — SODIUM CHLORIDE 0.9 % IV SOLN: INTRAVENOUS | Status: AC

## 2023-09-08 MED ORDER — SODIUM CHLORIDE 0.9 % IV SOLN
70.0000 mg/m2 | Freq: Once | INTRAVENOUS | Status: AC
  Administered 2023-09-08: 125 mg via INTRAVENOUS
  Filled 2023-09-08: qty 5

## 2023-09-08 MED ORDER — SODIUM CHLORIDE 0.9 % IV SOLN: INTRAVENOUS | Status: DC

## 2023-09-08 NOTE — Patient Instructions (Signed)
 [facility] - A DEPT OF [facility]  Discharge Instructions: Thank you for choosing [facility] to provide your oncology and hematology care.   If you have a lab appointment with the Cancer Center, please go directly to the Cancer Center and check in at the registration area.   Wear comfortable clothing and clothing appropriate for easy access to any Portacath or PICC line.   We strive to give you quality time with your provider. You may need to reschedule your appointment if you arrive late (15 or more minutes).  Arriving late affects you and other patients whose appointments are after yours.  Also, if you miss three or more appointments without notifying the office, you may be dismissed from the clinic at the provider's discretion.      For prescription refill requests, have your pharmacy contact our office and allow 72 hours for refills to be completed.    Today you received the following chemotherapy and/or immunotherapy agents Rituxan  & Bendeka       To help prevent nausea and vomiting after your treatment, we encourage you to take your nausea medication as directed.  BELOW ARE SYMPTOMS THAT SHOULD BE REPORTED IMMEDIATELY: *FEVER GREATER THAN 100.4 F (38 C) OR HIGHER *CHILLS OR SWEATING *NAUSEA AND VOMITING THAT IS NOT CONTROLLED WITH YOUR NAUSEA MEDICATION *UNUSUAL SHORTNESS OF BREATH *UNUSUAL BRUISING OR BLEEDING *URINARY PROBLEMS (pain or burning when urinating, or frequent urination) *BOWEL PROBLEMS (unusual diarrhea, constipation, pain near the anus) TENDERNESS IN MOUTH AND THROAT WITH OR WITHOUT PRESENCE OF ULCERS (sore throat, sores in mouth, or a toothache) UNUSUAL RASH, SWELLING OR PAIN  UNUSUAL VAGINAL DISCHARGE OR ITCHING   Items with * indicate a potential emergency and should be followed up as soon as possible or go to the Emergency Department if any problems should occur.  Please show the CHEMOTHERAPY ALERT CARD or  IMMUNOTHERAPY ALERT CARD at check-in to the Emergency Department and triage nurse.  Should you have questions after your visit or need to cancel or reschedule your appointment, please contact [facility] - A DEPT OF [facility]  Dept: [phone]  and follow the prompts.  Office hours are 8:00 a.m. to 4:30 p.m. Monday - Friday. Please note that voicemails left after 4:00 p.m. may not be returned until the following business day.  We are closed weekends and major holidays. You have access to a nurse at all times for urgent questions. Please call the main number to the clinic Dept: [phone] and follow the prompts.   For any non-urgent questions, you may also contact your provider using MyChart. We now offer e-Visits for anyone 28 and older to request care online for non-urgent symptoms. For details visit [email or web address].   Also download the MyChart app! Go to the app store, search MyChart, open the app, select [facility], and log in with your MyChart username and password.  Rituximab  Injection What is this medication? RITUXIMAB  (ri TUX i mab) treats leukemia and lymphoma. It works by blocking a protein that causes cancer cells to grow and multiply. This helps to slow or stop the spread of cancer cells. It may also be used to treat autoimmune conditions, such as arthritis. It works by slowing down an overactive immune system. It is a monoclonal antibody. This medicine may be used for other purposes; ask your health care provider or pharmacist if you have questions. COMMON BRAND NAME(S): RIABNI , Rituxan ,  RUXIENCE , truxima  What should I tell my care team before I take this medication? They need to know if you have any of these conditions: Chest pain Heart disease Immune system problems Infection, such as chickenpox, cold sores, hepatitis B, herpes Irregular heartbeat or rhythm Kidney disease Low blood counts, such as low white cells, platelets, red  cells Lung disease Recent or upcoming vaccine An unusual or allergic reaction to rituximab , other medications, foods, dyes, or preservatives Pregnant or trying to get pregnant Breast-feeding How should I use this medication? This medication is injected into a vein. It is given by a care team in a hospital or clinic setting. A special MedGuide will be given to you before each treatment. Be sure to read this information carefully each time. Talk to your care team about the use of this medication in children. While this medication may be prescribed for children as young as 6 months for selected conditions, precautions do apply. Overdosage: If you think you have taken too much of this medicine contact a poison control center or emergency room at once. NOTE: This medicine is only for you. Do not share this medicine with others. What if I miss a dose? Keep appointments for follow-up doses. It is important not to miss your dose. Call your care team if you are unable to keep an appointment. What may interact with this medication? Do not take this medication with any of the following: Live vaccines This medication may also interact with the following: Cisplatin This list may not describe all possible interactions. Give your health care provider a list of all the medicines, herbs, non-prescription drugs, or dietary supplements you use. Also tell them if you smoke, drink alcohol, or use illegal drugs. Some items may interact with your medicine. What should I watch for while using this medication? Your condition will be monitored carefully while you are receiving this medication. You may need blood work while taking this medication. This medication can cause serious infusion reactions. To reduce the risk your care team may give you other medications to take before receiving this one. Be sure to follow the directions from your care team. This medication may increase your risk of getting an infection. Call  your care team for advice if you get a fever, chills, sore throat, or other symptoms of a cold or flu. Do not treat yourself. Try to avoid being around people who are sick. Call your care team if you are around anyone with measles, chickenpox, or if you develop sores or blisters that do not heal properly. Avoid taking medications that contain aspirin, acetaminophen , ibuprofen, naproxen, or ketoprofen unless instructed by your care team. These medications may hide a fever. This medication may cause serious skin reactions. They can happen weeks to months after starting the medication. Contact your care team right away if you notice fevers or flu-like symptoms with a rash. The rash may be red or purple and then turn into blisters or peeling of the skin. You may also notice a red rash with swelling of the face, lips, or lymph nodes in your neck or under your arms. In some patients, this medication may cause a serious brain infection that may cause death. If you have any problems seeing, thinking, speaking, walking, or standing, tell your care team right away. If you cannot reach your care team, urgently seek another source of medical care. Talk to your care team if you may be pregnant. Serious birth defects can occur if you take this medication  during pregnancy and for 12 months after the last dose. You will need a negative pregnancy test before starting this medication. Contraception is recommended while taking this medication and for 12 months after the last dose. Your care team can help you find the option that works for you. Do not breastfeed while taking this medication and for at least 6 months after the last dose. What side effects may I notice from receiving this medication? Side effects that you should report to your care team as soon as possible: Allergic reactions or angioedema--skin rash, itching or hives, swelling of the face, eyes, lips, tongue, arms, or legs, trouble swallowing or  breathing Bowel blockage--stomach cramping, unable to have a bowel movement or pass gas, loss of appetite, vomiting Dizziness, loss of balance or coordination, confusion or trouble speaking Heart attack--pain or tightness in the chest, shoulders, arms, or jaw, nausea, shortness of breath, cold or clammy skin, feeling faint or lightheaded Heart rhythm changes--fast or irregular heartbeat, dizziness, feeling faint or lightheaded, chest pain, trouble breathing Infection--fever, chills, cough, sore throat, wounds that don't heal, pain or trouble when passing urine, general feeling of discomfort or being unwell Infusion reactions--chest pain, shortness of breath or trouble breathing, feeling faint or lightheaded Kidney injury--decrease in the amount of urine, swelling of the ankles, hands, or feet Liver injury--right upper belly pain, loss of appetite, nausea, light-colored stool, dark yellow or brown urine, yellowing skin or eyes, unusual weakness or fatigue Redness, blistering, peeling, or loosening of the skin, including inside the mouth Stomach pain that is severe, does not go away, or gets worse Tumor lysis syndrome (TLS)--nausea, vomiting, diarrhea, decrease in the amount of urine, dark urine, unusual weakness or fatigue, confusion, muscle pain or cramps, fast or irregular heartbeat, joint pain Side effects that usually do not require medical attention (report to your care team if they continue or are bothersome): Headache Joint pain Nausea Runny or stuffy nose Unusual weakness or fatigue This list may not describe all possible side effects. Call your doctor for medical advice about side effects. You may report side effects to FDA at 1-800-FDA-1088. Where should I keep my medication? This medication is given in a hospital or clinic. It will not be stored at home. NOTE: This sheet is a summary. It may not cover all possible information. If you have questions about this medicine, talk to your  doctor, pharmacist, or health care provider.  2024 Elsevier/Gold Standard (2021-06-19 00:00:00)  Bendamustine  Injection What is this medication? BENDAMUSTINE  (BEN da MUS teen) treats leukemia and lymphoma. It works by slowing down the growth of cancer cells. This medicine may be used for other purposes; ask your health care provider or pharmacist if you have questions. COMMON BRAND NAME(S): BELRAPZO , BENDEKA , Treanda , VIVIMUSTA  What should I tell my care team before I take this medication? They need to know if you have any of these conditions: Infection, especially a viral infection, such as chickenpox, cold sores, herpes Kidney disease Liver disease An unusual or allergic reaction to bendamustine , mannitol, other medications, foods, dyes, or preservatives Pregnant or trying to get pregnant Breast-feeding How should I use this medication? This medication is injected into a vein. It is given by your care team in a hospital or clinic setting. Talk to your care team about the use of this medication in children. Special care may be needed. Overdosage: If you think you have taken too much of this medicine contact a poison control center or emergency room at once. NOTE: This medicine  is only for you. Do not share this medicine with others. What if I miss a dose? Keep appointments for follow-up doses. It is important not to miss your dose. Call your care team if you are unable to keep an appointment. What may interact with this medication? Do not take this medication with any of the following: Clozapine This medication may also interact with the following: Atazanavir Cimetidine Ciprofloxacin Enoxacin Fluvoxamine Medications for seizures, such as carbamazepine, phenobarbital Mexiletine Rifampin Tacrine Thiabendazole Zileuton This list may not describe all possible interactions. Give your health care provider a list of all the medicines, herbs, non-prescription drugs, or dietary  supplements you use. Also tell them if you smoke, drink alcohol, or use illegal drugs. Some items may interact with your medicine. What should I watch for while using this medication? Visit your care team for regular checks on your progress. This medication may make you feel generally unwell. This is not uncommon, as chemotherapy can affect healthy cells as well as cancer cells. Report any side effects. Continue your course of treatment even though you feel ill unless your care team tells you to stop. You may need blood work while taking this medication. This medication may increase your risk of getting an infection. Call your care team for advice if you get a fever, chills, sore throat, or other symptoms of a cold or flu. Do not treat yourself. Try to avoid being around people who are sick. This medication may cause serious skin reactions. They can happen weeks to months after starting the medication. Contact your care team right away if you notice fevers or flu-like symptoms with a rash. The rash may be red or purple and then turn into blisters or peeling of the skin. You may also notice a red rash with swelling of the face, lips, or lymph nodes in your neck or under your arms. In some patients, this medication may cause a serious brain infection that may cause death. If you have any problems seeing, thinking, speaking, walking, or standing, tell your care team right away. If you cannot reach your care team, urgently seek other source of medical care. This medication may increase your risk to bruise or bleed. Call your care team if you notice any unusual bleeding. Talk to your care team about your risk of cancer. You may be more at risk for certain types of cancer if you take this medication. Talk to your care team about your risk of skin cancer. You may be more at risk for skin cancer if you take this medication. Talk to your care team if you or your partner wish to become pregnant or think either of  you might be pregnant. This medication can cause serious birth defects if taken during pregnancy or for up to 6 months after the last dose. A negative pregnancy test is required before starting this medication. A reliable form of contraception is recommended while taking this medication and for 6 months after the last dose. Talk to your care team about reliable forms of contraception. Wear a condom while taking this medication and for at least 3 months after the last dose. Do not breast-feed while taking this medication or for at least 1 week after the last dose. This medication may cause infertility. Talk to your care team if you are concerned about your fertility. What side effects may I notice from receiving this medication? Side effects that you should report to your care team as soon as possible: Allergic reactions--skin rash,  itching, hives, swelling of the face, lips, tongue, or throat Infection--fever, chills, cough, sore throat, wounds that don't heal, pain or trouble when passing urine, general feeling of discomfort or being unwell Infusion reactions--chest pain, shortness of breath or trouble breathing, feeling faint or lightheaded Liver injury--right upper belly pain, loss of appetite, nausea, light-colored stool, dark yellow or brown urine, yellowing skin or eyes, unusual weakness or fatigue Low red blood cell level--unusual weakness or fatigue, dizziness, headache, trouble breathing Painful swelling, warmth, or redness of the skin, blisters or sores at the infusion site Rash, fever, and swollen lymph nodes Redness, blistering, peeling, or loosening of the skin, including inside the mouth Tumor lysis syndrome (TLS)--nausea, vomiting, diarrhea, decrease in the amount of urine, dark urine, unusual weakness or fatigue, confusion, muscle pain or cramps, fast or irregular heartbeat, joint pain Unusual bruising or bleeding Side effects that usually do not require medical attention (report to  your care team if they continue or are bothersome): Diarrhea Fatigue Headache Loss of appetite Nausea Vomiting This list may not describe all possible side effects. Call your doctor for medical advice about side effects. You may report side effects to FDA at 1-800-FDA-1088. Where should I keep my medication? This medication is given in a hospital or clinic. It will not be stored at home. NOTE: This sheet is a summary. It may not cover all possible information. If you have questions about this medicine, talk to your doctor, pharmacist, or health care provider.  2024 Elsevier/Gold Standard (2021-05-20 00:00:00)

## 2023-09-08 NOTE — Progress Notes (Signed)
 [facility] Clinical Social Work  Clinical Social Work was referred by medical provider for [employer] (ACP). Patient is under the care of Dr. Onesimo, history of CLL, newly diagnosed low grade lymphoma.  Clinical Social Worker met with patient to offer support and assess for needs. At time of visit, patient was alone and drifting into sleep. Patient was able to stay awake enough for CSW to discuss Advance Directives.    Follow Up Plan:  Due to patient being sleepy at time of visit. CSW provided  Advance Directive Clinic Flyer with written upcoming dates. Patient desires spouse to be involved in conversation. CSW will call patient on 8/01 for new patient assessment and discussion on ACP.   Lizbeth Sprague, LCSW  Clinical Social Worker [facility]

## 2023-09-08 NOTE — Progress Notes (Signed)
 Adjusted Rituximab  to 600 mg (375 mg/m^2) due to recent weight loss per Dr. Onesimo.   Alfonso MARLA Buys, PharmD Pharmacy Resident  09/08/2023 10:48 AM

## 2023-09-09 ENCOUNTER — Encounter: Payer: Self-pay | Admitting: Hematology

## 2023-09-09 ENCOUNTER — Inpatient Hospital Stay

## 2023-09-09 VITALS — BP 118/62 | HR 72 | Temp 98.2°F | Resp 18

## 2023-09-09 DIAGNOSIS — C9110 Chronic lymphocytic leukemia of B-cell type not having achieved remission: Secondary | ICD-10-CM

## 2023-09-09 DIAGNOSIS — Z5112 Encounter for antineoplastic immunotherapy: Secondary | ICD-10-CM | POA: Diagnosis not present

## 2023-09-09 LAB — HEPATITIS B CORE ANTIBODY, TOTAL: HEP B CORE AB: POSITIVE — AB

## 2023-09-09 MED ORDER — SODIUM CHLORIDE 0.9 % IV SOLN
70.0000 mg/m2 | Freq: Once | INTRAVENOUS | Status: AC
  Administered 2023-09-09: 125 mg via INTRAVENOUS
  Filled 2023-09-09: qty 5

## 2023-09-09 MED ORDER — DEXAMETHASONE SODIUM PHOSPHATE 10 MG/ML IJ SOLN
10.0000 mg | Freq: Once | INTRAMUSCULAR | Status: AC
  Administered 2023-09-09: 10 mg via INTRAVENOUS

## 2023-09-09 MED ORDER — SODIUM CHLORIDE 0.9 % IV SOLN: INTRAVENOUS | Status: DC

## 2023-09-09 NOTE — Patient Instructions (Signed)
 [facility] - A DEPT OF [facility]  Discharge Instructions: Thank you for choosing [facility] to provide your oncology and hematology care.   If you have a lab appointment with the Cancer Center, please go directly to the Cancer Center and check in at the registration area.   Wear comfortable clothing and clothing appropriate for easy access to any Portacath or PICC line.   We strive to give you quality time with your provider. You may need to reschedule your appointment if you arrive late (15 or more minutes).  Arriving late affects you and other patients whose appointments are after yours.  Also, if you miss three or more appointments without notifying the office, you may be dismissed from the clinic at the provider's discretion.      For prescription refill requests, have your pharmacy contact our office and allow 72 hours for refills to be completed.    Today you received the following chemotherapy and/or immunotherapy agents Bendeka       To help prevent nausea and vomiting after your treatment, we encourage you to take your nausea medication as directed.  BELOW ARE SYMPTOMS THAT SHOULD BE REPORTED IMMEDIATELY: *FEVER GREATER THAN 100.4 F (38 C) OR HIGHER *CHILLS OR SWEATING *NAUSEA AND VOMITING THAT IS NOT CONTROLLED WITH YOUR NAUSEA MEDICATION *UNUSUAL SHORTNESS OF BREATH *UNUSUAL BRUISING OR BLEEDING *URINARY PROBLEMS (pain or burning when urinating, or frequent urination) *BOWEL PROBLEMS (unusual diarrhea, constipation, pain near the anus) TENDERNESS IN MOUTH AND THROAT WITH OR WITHOUT PRESENCE OF ULCERS (sore throat, sores in mouth, or a toothache) UNUSUAL RASH, SWELLING OR PAIN  UNUSUAL VAGINAL DISCHARGE OR ITCHING   Items with * indicate a potential emergency and should be followed up as soon as possible or go to the Emergency Department if any problems should occur.  Please show the CHEMOTHERAPY ALERT CARD or IMMUNOTHERAPY  ALERT CARD at check-in to the Emergency Department and triage nurse.  Should you have questions after your visit or need to cancel or reschedule your appointment, please contact [facility] - A DEPT OF [facility]  Dept: [phone]  and follow the prompts.  Office hours are 8:00 a.m. to 4:30 p.m. Monday - Friday. Please note that voicemails left after 4:00 p.m. may not be returned until the following business day.  We are closed weekends and major holidays. You have access to a nurse at all times for urgent questions. Please call the main number to the clinic Dept: [phone] and follow the prompts.   For any non-urgent questions, you may also contact your provider using MyChart. We now offer e-Visits for anyone 73 and older to request care online for non-urgent symptoms. For details visit [email or web address].   Also download the MyChart app! Go to the app store, search MyChart, open the app, select [place], and log in with your MyChart username and password.  Rituximab  Injection What is this medication? RITUXIMAB  (ri TUX i mab) treats leukemia and lymphoma. It works by blocking a protein that causes cancer cells to grow and multiply. This helps to slow or stop the spread of cancer cells. It may also be used to treat autoimmune conditions, such as arthritis. It works by slowing down an overactive immune system. It is a monoclonal antibody. This medicine may be used for other purposes; ask your health care provider or pharmacist if you have questions. COMMON BRAND NAME(S): RIABNI , Rituxan , RUXIENCE , truxima   What should I tell my care team before I take this medication? They need to know if you have any of these conditions: Chest pain Heart disease Immune system problems Infection, such as chickenpox, cold sores, hepatitis B, herpes Irregular heartbeat or rhythm Kidney disease Low blood counts, such as low white cells, platelets, red cells Lung  disease Recent or upcoming vaccine An unusual or allergic reaction to rituximab , other medications, foods, dyes, or preservatives Pregnant or trying to get pregnant Breast-feeding How should I use this medication? This medication is injected into a vein. It is given by a care team in a hospital or clinic setting. A special MedGuide will be given to you before each treatment. Be sure to read this information carefully each time. Talk to your care team about the use of this medication in children. While this medication may be prescribed for children as young as 6 months for selected conditions, precautions do apply. Overdosage: If you think you have taken too much of this medicine contact a poison control center or emergency room at once. NOTE: This medicine is only for you. Do not share this medicine with others. What if I miss a dose? Keep appointments for follow-up doses. It is important not to miss your dose. Call your care team if you are unable to keep an appointment. What may interact with this medication? Do not take this medication with any of the following: Live vaccines This medication may also interact with the following: Cisplatin This list may not describe all possible interactions. Give your health care provider a list of all the medicines, herbs, non-prescription drugs, or dietary supplements you use. Also tell them if you smoke, drink alcohol, or use illegal drugs. Some items may interact with your medicine. What should I watch for while using this medication? Your condition will be monitored carefully while you are receiving this medication. You may need blood work while taking this medication. This medication can cause serious infusion reactions. To reduce the risk your care team may give you other medications to take before receiving this one. Be sure to follow the directions from your care team. This medication may increase your risk of getting an infection. Call your care  team for advice if you get a fever, chills, sore throat, or other symptoms of a cold or flu. Do not treat yourself. Try to avoid being around people who are sick. Call your care team if you are around anyone with measles, chickenpox, or if you develop sores or blisters that do not heal properly. Avoid taking medications that contain aspirin, acetaminophen , ibuprofen, naproxen, or ketoprofen unless instructed by your care team. These medications may hide a fever. This medication may cause serious skin reactions. They can happen weeks to months after starting the medication. Contact your care team right away if you notice fevers or flu-like symptoms with a rash. The rash may be red or purple and then turn into blisters or peeling of the skin. You may also notice a red rash with swelling of the face, lips, or lymph nodes in your neck or under your arms. In some patients, this medication may cause a serious brain infection that may cause death. If you have any problems seeing, thinking, speaking, walking, or standing, tell your care team right away. If you cannot reach your care team, urgently seek another source of medical care. Talk to your care team if you may be pregnant. Serious birth defects can occur if you take this medication during pregnancy  and for 12 months after the last dose. You will need a negative pregnancy test before starting this medication. Contraception is recommended while taking this medication and for 12 months after the last dose. Your care team can help you find the option that works for you. Do not breastfeed while taking this medication and for at least 6 months after the last dose. What side effects may I notice from receiving this medication? Side effects that you should report to your care team as soon as possible: Allergic reactions or angioedema--skin rash, itching or hives, swelling of the face, eyes, lips, tongue, arms, or legs, trouble swallowing or breathing Bowel  blockage--stomach cramping, unable to have a bowel movement or pass gas, loss of appetite, vomiting Dizziness, loss of balance or coordination, confusion or trouble speaking Heart attack--pain or tightness in the chest, shoulders, arms, or jaw, nausea, shortness of breath, cold or clammy skin, feeling faint or lightheaded Heart rhythm changes--fast or irregular heartbeat, dizziness, feeling faint or lightheaded, chest pain, trouble breathing Infection--fever, chills, cough, sore throat, wounds that don't heal, pain or trouble when passing urine, general feeling of discomfort or being unwell Infusion reactions--chest pain, shortness of breath or trouble breathing, feeling faint or lightheaded Kidney injury--decrease in the amount of urine, swelling of the ankles, hands, or feet Liver injury--right upper belly pain, loss of appetite, nausea, light-colored stool, dark yellow or brown urine, yellowing skin or eyes, unusual weakness or fatigue Redness, blistering, peeling, or loosening of the skin, including inside the mouth Stomach pain that is severe, does not go away, or gets worse Tumor lysis syndrome (TLS)--nausea, vomiting, diarrhea, decrease in the amount of urine, dark urine, unusual weakness or fatigue, confusion, muscle pain or cramps, fast or irregular heartbeat, joint pain Side effects that usually do not require medical attention (report to your care team if they continue or are bothersome): Headache Joint pain Nausea Runny or stuffy nose Unusual weakness or fatigue This list may not describe all possible side effects. Call your doctor for medical advice about side effects. You may report side effects to FDA at 1-800-FDA-1088. Where should I keep my medication? This medication is given in a hospital or clinic. It will not be stored at home. NOTE: This sheet is a summary. It may not cover all possible information. If you have questions about this medicine, talk to your doctor, pharmacist,  or health care provider.  2024 Elsevier/Gold Standard (2021-06-19 00:00:00)  Bendamustine  Injection What is this medication? BENDAMUSTINE  (BEN da MUS teen) treats leukemia and lymphoma. It works by slowing down the growth of cancer cells. This medicine may be used for other purposes; ask your health care provider or pharmacist if you have questions. COMMON BRAND NAME(S): BELRAPZO , BENDEKA , Treanda , VIVIMUSTA  What should I tell my care team before I take this medication? They need to know if you have any of these conditions: Infection, especially a viral infection, such as chickenpox, cold sores, herpes Kidney disease Liver disease An unusual or allergic reaction to bendamustine , mannitol, other medications, foods, dyes, or preservatives Pregnant or trying to get pregnant Breast-feeding How should I use this medication? This medication is injected into a vein. It is given by your care team in a hospital or clinic setting. Talk to your care team about the use of this medication in children. Special care may be needed. Overdosage: If you think you have taken too much of this medicine contact a poison control center or emergency room at once. NOTE: This medicine is only  for you. Do not share this medicine with others. What if I miss a dose? Keep appointments for follow-up doses. It is important not to miss your dose. Call your care team if you are unable to keep an appointment. What may interact with this medication? Do not take this medication with any of the following: Clozapine This medication may also interact with the following: Atazanavir Cimetidine Ciprofloxacin Enoxacin Fluvoxamine Medications for seizures, such as carbamazepine, phenobarbital Mexiletine Rifampin Tacrine Thiabendazole Zileuton This list may not describe all possible interactions. Give your health care provider a list of all the medicines, herbs, non-prescription drugs, or dietary supplements you use. Also  tell them if you smoke, drink alcohol, or use illegal drugs. Some items may interact with your medicine. What should I watch for while using this medication? Visit your care team for regular checks on your progress. This medication may make you feel generally unwell. This is not uncommon, as chemotherapy can affect healthy cells as well as cancer cells. Report any side effects. Continue your course of treatment even though you feel ill unless your care team tells you to stop. You may need blood work while taking this medication. This medication may increase your risk of getting an infection. Call your care team for advice if you get a fever, chills, sore throat, or other symptoms of a cold or flu. Do not treat yourself. Try to avoid being around people who are sick. This medication may cause serious skin reactions. They can happen weeks to months after starting the medication. Contact your care team right away if you notice fevers or flu-like symptoms with a rash. The rash may be red or purple and then turn into blisters or peeling of the skin. You may also notice a red rash with swelling of the face, lips, or lymph nodes in your neck or under your arms. In some patients, this medication may cause a serious brain infection that may cause death. If you have any problems seeing, thinking, speaking, walking, or standing, tell your care team right away. If you cannot reach your care team, urgently seek other source of medical care. This medication may increase your risk to bruise or bleed. Call your care team if you notice any unusual bleeding. Talk to your care team about your risk of cancer. You may be more at risk for certain types of cancer if you take this medication. Talk to your care team about your risk of skin cancer. You may be more at risk for skin cancer if you take this medication. Talk to your care team if you or your partner wish to become pregnant or think either of you might be pregnant. This  medication can cause serious birth defects if taken during pregnancy or for up to 6 months after the last dose. A negative pregnancy test is required before starting this medication. A reliable form of contraception is recommended while taking this medication and for 6 months after the last dose. Talk to your care team about reliable forms of contraception. Wear a condom while taking this medication and for at least 3 months after the last dose. Do not breast-feed while taking this medication or for at least 1 week after the last dose. This medication may cause infertility. Talk to your care team if you are concerned about your fertility. What side effects may I notice from receiving this medication? Side effects that you should report to your care team as soon as possible: Allergic reactions--skin rash, itching, hives,  swelling of the face, lips, tongue, or throat Infection--fever, chills, cough, sore throat, wounds that don't heal, pain or trouble when passing urine, general feeling of discomfort or being unwell Infusion reactions--chest pain, shortness of breath or trouble breathing, feeling faint or lightheaded Liver injury--right upper belly pain, loss of appetite, nausea, light-colored stool, dark yellow or brown urine, yellowing skin or eyes, unusual weakness or fatigue Low red blood cell level--unusual weakness or fatigue, dizziness, headache, trouble breathing Painful swelling, warmth, or redness of the skin, blisters or sores at the infusion site Rash, fever, and swollen lymph nodes Redness, blistering, peeling, or loosening of the skin, including inside the mouth Tumor lysis syndrome (TLS)--nausea, vomiting, diarrhea, decrease in the amount of urine, dark urine, unusual weakness or fatigue, confusion, muscle pain or cramps, fast or irregular heartbeat, joint pain Unusual bruising or bleeding Side effects that usually do not require medical attention (report to your care team if they  continue or are bothersome): Diarrhea Fatigue Headache Loss of appetite Nausea Vomiting This list may not describe all possible side effects. Call your doctor for medical advice about side effects. You may report side effects to FDA at 1-800-FDA-1088. Where should I keep my medication? This medication is given in a hospital or clinic. It will not be stored at home. NOTE: This sheet is a summary. It may not cover all possible information. If you have questions about this medicine, talk to your doctor, pharmacist, or health care provider.  2024 Elsevier/Gold Standard (2021-05-20 00:00:00)

## 2023-09-09 NOTE — Progress Notes (Signed)
 Rapid Infusion Rituximab  Pharmacist Evaluation  Tyler Scott is a 68 y.o. male being treated with rituximab  for CLL. This patient may be considered for RIR.   A pharmacist has verified the patient tolerated rituximab  infusions per the [facility] standard infusion protocol without grade 3-4 infusion reactions. The treatment plan will be updated to reflect RIR if the patient qualifies per the checklist below:   Age > 11 years old Yes   Clinically significant cardiovascular disease No   Circulating lymphocyte count < 5000/uL prior to cycle two Yes  Lab Results  Component Value Date   LYMPHSABS 25.7 (H) 09/08/2023    Prior documented grade 3-4 infusion reaction to rituximab  No   Prior documented grade 1-2 infusion reaction to rituximab  (If YES, Pharmacist will confirm with Physician if patient is still a candidate for RIR) No   Previous rituximab  infusion within the past 6 months Yes   Treatment Plan updated orders to reflect RIR Yes    Tyler Scott does meet the criteria for Rapid Infusion Rituximab . This patient is going to be switched to rapid infusion rituximab .   Cassey Hurrell, Pharm.D., CPP 09/09/2023@7 :56 AM

## 2023-09-10 LAB — HEPATITIS B DNA, ULTRAQUANTITATIVE, PCR
HBV DNA SERPL PCR-ACNC: NOT DETECTED [IU]/mL
HBV DNA SERPL PCR-LOG IU: UNDETERMINED {Log_IU}/mL

## 2023-09-13 ENCOUNTER — Other Ambulatory Visit: Payer: Self-pay

## 2023-09-13 ENCOUNTER — Inpatient Hospital Stay: Admitting: Hematology

## 2023-09-13 ENCOUNTER — Inpatient Hospital Stay: Attending: Hematology

## 2023-09-13 ENCOUNTER — Inpatient Hospital Stay

## 2023-09-13 ENCOUNTER — Telehealth: Payer: Self-pay

## 2023-09-13 DIAGNOSIS — C9110 Chronic lymphocytic leukemia of B-cell type not having achieved remission: Secondary | ICD-10-CM

## 2023-09-13 NOTE — Telephone Encounter (Signed)
-----   Message from Nurse Rudell CROME sent at 09/08/2023  4:11 PM EDT ----- Regarding: Onesimo 1st Tx F/U call - Rituxan /Bendeka  [place] 1st Tx F/U call - Rituxan /Bendeka .  Tolerated infusion well.

## 2023-09-13 NOTE — Telephone Encounter (Signed)
 Contacted pt to follow-up on pt's first tx last week. Pt states he is doing ok , but has had a decreased appetite . Pt states no nausea. Pt will call if any issues arise.

## 2023-09-14 ENCOUNTER — Inpatient Hospital Stay

## 2023-09-14 ENCOUNTER — Inpatient Hospital Stay: Admitting: Hematology

## 2023-09-14 ENCOUNTER — Other Ambulatory Visit: Payer: Self-pay

## 2023-09-14 ENCOUNTER — Inpatient Hospital Stay: Attending: Hematology

## 2023-09-14 DIAGNOSIS — Z5111 Encounter for antineoplastic chemotherapy: Secondary | ICD-10-CM | POA: Insufficient documentation

## 2023-09-14 DIAGNOSIS — D649 Anemia, unspecified: Secondary | ICD-10-CM | POA: Insufficient documentation

## 2023-09-14 DIAGNOSIS — R0902 Hypoxemia: Secondary | ICD-10-CM | POA: Insufficient documentation

## 2023-09-14 DIAGNOSIS — R7989 Other specified abnormal findings of blood chemistry: Secondary | ICD-10-CM | POA: Diagnosis not present

## 2023-09-14 DIAGNOSIS — D696 Thrombocytopenia, unspecified: Secondary | ICD-10-CM | POA: Diagnosis not present

## 2023-09-14 DIAGNOSIS — C9110 Chronic lymphocytic leukemia of B-cell type not having achieved remission: Secondary | ICD-10-CM

## 2023-09-14 DIAGNOSIS — Z5112 Encounter for antineoplastic immunotherapy: Secondary | ICD-10-CM | POA: Insufficient documentation

## 2023-09-14 DIAGNOSIS — I959 Hypotension, unspecified: Secondary | ICD-10-CM | POA: Insufficient documentation

## 2023-09-14 LAB — CMP (CANCER CENTER ONLY)
ALT: 10 U/L (ref 0–44)
AST: 25 U/L (ref 15–41)
Albumin: 3.7 g/dL (ref 3.5–5.0)
Alkaline Phosphatase: 112 U/L (ref 38–126)
Anion gap: 9 (ref 5–15)
BUN: 33 mg/dL — ABNORMAL HIGH (ref 8–23)
CO2: 29 mmol/L (ref 22–32)
Calcium: 10.7 mg/dL — ABNORMAL HIGH (ref 8.9–10.3)
Chloride: 99 mmol/L (ref 98–111)
Creatinine: 1.27 mg/dL — ABNORMAL HIGH (ref 0.61–1.24)
GFR, Estimated: >60 mL/min (ref >60)
Glucose, Bld: 143 mg/dL — ABNORMAL HIGH (ref 70–99)
Potassium: 4.5 mmol/L (ref 3.5–5.1)
Sodium: 137 mmol/L (ref 135–145)
Total Bilirubin: 1 mg/dL (ref 0.0–1.2)
Total Protein: 7 g/dL (ref 6.5–8.1)

## 2023-09-14 LAB — CBC WITH DIFFERENTIAL (CANCER CENTER ONLY)
Abs Immature Granulocytes: 0.04 K/uL (ref 0.00–0.07)
Basophils Absolute: 0.1 K/uL (ref 0.0–0.1)
Basophils Relative: 1 %
Eosinophils Absolute: 0 K/uL (ref 0.0–0.5)
Eosinophils Relative: 0 %
HCT: 22.4 % — ABNORMAL LOW (ref 39.0–52.0)
Hemoglobin: 7.3 g/dL — ABNORMAL LOW (ref 13.0–17.0)
Immature Granulocytes: 0 %
Lymphocytes Relative: 80 %
Lymphs Abs: 7.8 K/uL — ABNORMAL HIGH (ref 0.7–4.0)
MCH: 30.9 pg (ref 26.0–34.0)
MCHC: 32.6 g/dL (ref 30.0–36.0)
MCV: 94.9 fL (ref 80.0–100.0)
Monocytes Absolute: 0.4 K/uL (ref 0.1–1.0)
Monocytes Relative: 4 %
Neutro Abs: 1.5 K/uL — ABNORMAL LOW (ref 1.7–7.7)
Neutrophils Relative %: 15 %
Platelet Count: 62 K/uL — ABNORMAL LOW (ref 150–400)
RBC: 2.36 MIL/uL — ABNORMAL LOW (ref 4.22–5.81)
RDW: 17.1 % — ABNORMAL HIGH (ref 11.5–15.5)
WBC Count: 9.8 K/uL (ref 4.0–10.5)
nRBC: 0.4 % — ABNORMAL HIGH (ref 0.0–0.2)

## 2023-09-14 LAB — SAMPLE TO BLOOD BANK

## 2023-09-14 LAB — PREPARE RBC (CROSSMATCH)

## 2023-09-14 MED ORDER — SODIUM CHLORIDE 0.9% IV SOLUTION
250.0000 mL | INTRAVENOUS | Status: DC
  Administered 2023-09-14: 100 mL via INTRAVENOUS

## 2023-09-14 MED ORDER — METHYLPREDNISOLONE SODIUM SUCC 40 MG IJ SOLR
40.0000 mg | Freq: Once | INTRAMUSCULAR | Status: AC
  Administered 2023-09-14: 40 mg via INTRAVENOUS
  Filled 2023-09-14: qty 1

## 2023-09-14 MED ORDER — ACETAMINOPHEN 325 MG PO TABS
650.0000 mg | ORAL_TABLET | Freq: Once | ORAL | Status: AC
  Administered 2023-09-14: 650 mg via ORAL
  Filled 2023-09-14: qty 2

## 2023-09-14 NOTE — Patient Instructions (Signed)

## 2023-09-15 ENCOUNTER — Encounter: Payer: Self-pay | Admitting: Hematology

## 2023-09-15 LAB — BPAM RBC
Blood Product Expiration Date: 202508312359
ISSUE DATE / TIME: 202508051048
Unit Type and Rh: 6200

## 2023-09-15 LAB — TYPE AND SCREEN
ABO/RH(D): A POS
Antibody Screen: NEGATIVE
Unit division: 0

## 2023-09-16 ENCOUNTER — Telehealth: Payer: Self-pay | Admitting: Dietician

## 2023-09-16 NOTE — Telephone Encounter (Signed)
 Patient screened on MST. First attempt to reach. Patient home and spouse not with him.  He prefers to talk when spouse is available.  Offered to call back or schedule when wife can be around. Provided my cell# and availability in a text to return call to set up a remote nutrition consult.  Micheline Craven, RDN, LDN Registered Dietitian, [facility] Part Time Remote (Usual office hours: Tuesday-Thursday) Cell: [phone]

## 2023-09-17 ENCOUNTER — Telehealth: Payer: Self-pay

## 2023-09-17 NOTE — Telephone Encounter (Signed)
 [facility] CSW Progress Note  Clinical Child psychotherapist contacted patient by phone to follow-up on Advance Care Planning. CSW provided education on Advance Directives. Patient and Spouse on call and are scheduled for 8/22 clinic. CSW sent email with details as requested.  Follow Up Plan:  No follow up scheduled,   Lizbeth Sprague, LCSW Clinical Social Worker [facility]

## 2023-09-21 ENCOUNTER — Other Ambulatory Visit: Payer: Self-pay

## 2023-09-21 DIAGNOSIS — D649 Anemia, unspecified: Secondary | ICD-10-CM

## 2023-09-21 DIAGNOSIS — D696 Thrombocytopenia, unspecified: Secondary | ICD-10-CM

## 2023-09-21 DIAGNOSIS — C9110 Chronic lymphocytic leukemia of B-cell type not having achieved remission: Secondary | ICD-10-CM

## 2023-09-22 ENCOUNTER — Inpatient Hospital Stay

## 2023-09-22 ENCOUNTER — Inpatient Hospital Stay: Admitting: Nurse Practitioner

## 2023-09-22 ENCOUNTER — Inpatient Hospital Stay (HOSPITAL_BASED_OUTPATIENT_CLINIC_OR_DEPARTMENT_OTHER): Admitting: Hematology

## 2023-09-22 ENCOUNTER — Encounter: Payer: Self-pay | Admitting: Nurse Practitioner

## 2023-09-22 VITALS — BP 108/58 | HR 120 | Temp 98.7°F | Resp 18 | Ht 68.0 in | Wt 124.9 lb

## 2023-09-22 DIAGNOSIS — D693 Immune thrombocytopenic purpura: Secondary | ICD-10-CM | POA: Diagnosis not present

## 2023-09-22 DIAGNOSIS — G893 Neoplasm related pain (acute) (chronic): Secondary | ICD-10-CM | POA: Diagnosis not present

## 2023-09-22 DIAGNOSIS — Z789 Other specified health status: Secondary | ICD-10-CM | POA: Diagnosis not present

## 2023-09-22 DIAGNOSIS — C9110 Chronic lymphocytic leukemia of B-cell type not having achieved remission: Secondary | ICD-10-CM | POA: Diagnosis not present

## 2023-09-22 DIAGNOSIS — R530 Neoplastic (malignant) related fatigue: Secondary | ICD-10-CM | POA: Diagnosis not present

## 2023-09-22 DIAGNOSIS — Z515 Encounter for palliative care: Secondary | ICD-10-CM

## 2023-09-22 DIAGNOSIS — R768 Other specified abnormal immunological findings in serum: Secondary | ICD-10-CM

## 2023-09-22 DIAGNOSIS — C8590 Non-Hodgkin lymphoma, unspecified, unspecified site: Secondary | ICD-10-CM | POA: Diagnosis not present

## 2023-09-22 DIAGNOSIS — D696 Thrombocytopenia, unspecified: Secondary | ICD-10-CM

## 2023-09-22 DIAGNOSIS — D649 Anemia, unspecified: Secondary | ICD-10-CM

## 2023-09-22 DIAGNOSIS — Z5112 Encounter for antineoplastic immunotherapy: Secondary | ICD-10-CM | POA: Diagnosis not present

## 2023-09-22 LAB — CMP (CANCER CENTER ONLY)
ALT: 82 U/L — ABNORMAL HIGH (ref 0–44)
AST: 128 U/L — ABNORMAL HIGH (ref 15–41)
Albumin: 3.6 g/dL (ref 3.5–5.0)
Alkaline Phosphatase: 139 U/L — ABNORMAL HIGH (ref 38–126)
Anion gap: 7 (ref 5–15)
BUN: 17 mg/dL (ref 8–23)
CO2: 29 mmol/L (ref 22–32)
Calcium: 8.5 mg/dL — ABNORMAL LOW (ref 8.9–10.3)
Chloride: 103 mmol/L (ref 98–111)
Creatinine: 1.19 mg/dL (ref 0.61–1.24)
GFR, Estimated: >60 mL/min (ref >60)
Glucose, Bld: 106 mg/dL — ABNORMAL HIGH (ref 70–99)
Potassium: 3.9 mmol/L (ref 3.5–5.1)
Sodium: 139 mmol/L (ref 135–145)
Total Bilirubin: 1.2 mg/dL (ref 0.0–1.2)
Total Protein: 6.4 g/dL — ABNORMAL LOW (ref 6.5–8.1)

## 2023-09-22 LAB — CBC WITH DIFFERENTIAL (CANCER CENTER ONLY)
Abs Immature Granulocytes: 0.05 K/uL (ref 0.00–0.07)
Basophils Absolute: 0.1 K/uL (ref 0.0–0.1)
Basophils Relative: 1 %
Eosinophils Absolute: 0 K/uL (ref 0.0–0.5)
Eosinophils Relative: 0 %
HCT: 27.7 % — ABNORMAL LOW (ref 39.0–52.0)
Hemoglobin: 9.1 g/dL — ABNORMAL LOW (ref 13.0–17.0)
Immature Granulocytes: 1 %
Lymphocytes Relative: 74 %
Lymphs Abs: 7.7 K/uL — ABNORMAL HIGH (ref 0.7–4.0)
MCH: 30.1 pg (ref 26.0–34.0)
MCHC: 32.9 g/dL (ref 30.0–36.0)
MCV: 91.7 fL (ref 80.0–100.0)
Monocytes Absolute: 0.5 K/uL (ref 0.1–1.0)
Monocytes Relative: 5 %
Neutro Abs: 2 K/uL (ref 1.7–7.7)
Neutrophils Relative %: 19 %
Platelet Count: 60 K/uL — ABNORMAL LOW (ref 150–400)
RBC: 3.02 MIL/uL — ABNORMAL LOW (ref 4.22–5.81)
RDW: 17.7 % — ABNORMAL HIGH (ref 11.5–15.5)
Smear Review: NORMAL
WBC Count: 10.3 K/uL (ref 4.0–10.5)
nRBC: 0.3 % — ABNORMAL HIGH (ref 0.0–0.2)

## 2023-09-22 LAB — SAMPLE TO BLOOD BANK

## 2023-09-22 MED ORDER — ROMIPLOSTIM 125 MCG ~~LOC~~ SOLR
65.0000 ug | Freq: Once | SUBCUTANEOUS | Status: AC
  Administered 2023-09-22 (×2): 65 ug via SUBCUTANEOUS
  Filled 2023-09-22: qty 0.13

## 2023-09-22 NOTE — Patient Instructions (Signed)
 Romiplostim Injection What is this medication? ROMIPLOSTIM (roe mi PLOE stim) treats low levels of platelets in your body caused by immune thrombocytopenia (ITP). It is prescribed when other medications have not worked or cannot be tolerated. It may also be used to help people who have been exposed to high doses of radiation. It works by increasing the amount of platelets in your blood. This lowers the risk of bleeding. This medicine may be used for other purposes; ask your health care provider or pharmacist if you have questions. COMMON BRAND NAME(S): Nplate What should I tell my care team before I take this medication? They need to know if you have any of these conditions: Blood clots Myelodysplastic syndrome An unusual or allergic reaction to romiplostim, mannitol, other medications, foods, dyes, or preservatives Pregnant or trying to get pregnant Breast-feeding How should I use this medication? This medication is injected under the skin. It is given by a care team in a hospital or clinic setting. A special MedGuide will be given to you before each treatment. Be sure to read this information carefully each time. Talk to your care team about the use of this medication in children. While it may be prescribed for children as young as newborns for selected conditions, precautions do apply. Overdosage: If you think you have taken too much of this medicine contact a poison control center or emergency room at once. NOTE: This medicine is only for you. Do not share this medicine with others. What if I miss a dose? Keep appointments for follow-up doses. It is important not to miss your dose. Call your care team if you are unable to keep an appointment. What may interact with this medication? Interactions are not expected. This list may not describe all possible interactions. Give your health care provider a list of all the medicines, herbs, non-prescription drugs, or dietary supplements you use. Also  tell them if you smoke, drink alcohol, or use illegal drugs. Some items may interact with your medicine. What should I watch for while using this medication? Visit your care team for regular checks on your progress. You may need blood work done while you are taking this medication. Your condition will be monitored carefully while you are receiving this medication. It is important not to miss any appointments. What side effects may I notice from receiving this medication? Side effects that you should report to your care team as soon as possible: Allergic reactions--skin rash, itching, hives, swelling of the face, lips, tongue, or throat Blood clot--pain, swelling, or warmth in the leg, shortness of breath, chest pain Side effects that usually do not require medical attention (report to your care team if they continue or are bothersome): Dizziness Joint pain Muscle pain Pain in the hands or feet Stomach pain Trouble sleeping This list may not describe all possible side effects. Call your doctor for medical advice about side effects. You may report side effects to FDA at 1-800-FDA-1088. Where should I keep my medication? This medication is given in a hospital or clinic. It will not be stored at home. NOTE: This sheet is a summary. It may not cover all possible information. If you have questions about this medicine, talk to your doctor, pharmacist, or health care provider.  2024 Elsevier/Gold Standard (2021-06-02 00:00:00)

## 2023-09-23 ENCOUNTER — Encounter: Payer: Self-pay | Admitting: Hematology

## 2023-09-23 NOTE — Progress Notes (Signed)
 Palliative Medicine [facility]  Telephone:(336) [phone] Fax:(336) [phone]   Name: Tyler Scott Date: 09/23/2023 MRN: [id number]  DOB: May 23, 1955  Patient Care Team: [name], MD as PCP - General (Family Medicine) Ethyl Bruckner FORBES, MD (Inactive)    INTERVAL HISTORY: ORVIN NETTER is a 68 y.o. male with history of CLL (2012) and with recent dx of low grade lymphoma (07/2023). status post several previous therapies for CLL including Bendamustine  and rituximab  (July 2017 and received total of 2 cycles. Therapy discontinued in August 2017 based on patient's wishes after achieving partial response). He developed a relapse disease in April 2019. Ibrutinib  420 mg daily started in April 2019 and discontinued in September 2023 after poor adherence. Most recently on Calquence  since 2023.     Patient was supposed to be on Gazyva , venetoclax . He had many interrupted cycles of Gazyva  due to No shows.  He was last seen in outpatient oncology on 07/06/23 at which time he was scheduled for C4D1 Gazyva  however was HELD due to infection/acute left sided epidydimo-orchitis for which he was on antibiotics.  Palliative following for symptom management support.   Admitted 08/06/23-08/18/23 for low back pain. Had retroperitoneal LN biopsy on 08/09/23. Pathology showed low grade lymphoma.   SOCIAL HISTORY:     reports that he has never smoked. He has never used smokeless tobacco. He reports current alcohol use of about 4.0 standard drinks of alcohol per week. He reports that he does not use drugs.  ADVANCE DIRECTIVES:  None on file. Patient expressed interest in completing.   CODE STATUS: Full code  PAST MEDICAL HISTORY: Past Medical History:  Diagnosis Date   Allergy    Arthritis    CLL (chronic lymphocytic leukemia) (HCC)    stage III: asymptomatic; on observation   DIABETES MELLITUS, TYPE I, UNCONTROLLED 12/30/2006   insulin    Diabetic retinopathy     ERECTILE DYSFUNCTION, ORGANIC 12/30/2006   Hx of adenomatous polyp of colon 05/24/2014   HYPERLIPIDEMIA 12/30/2006   HYPERTENSION 12/30/2006   LOW BACK PAIN, ACUTE 12/21/2006    ALLERGIES:  has no known allergies.  MEDICATIONS:  Current Outpatient Medications  Medication Sig Dispense Refill   acyclovir  (ZOVIRAX ) 400 MG tablet Take 1 tablet (400 mg total) by mouth daily. 30 tablet 3   allopurinol  (ZYLOPRIM ) 300 MG tablet Take 0.5 tablets (150 mg total) by mouth daily. 30 tablet 3   amLODipine  (NORVASC ) 10 MG tablet Take 1 tablet (10 mg total) by mouth daily for 10 days. 30 tablet 0   Blood Glucose Monitoring Suppl (ONE TOUCH ULTRA 2) W/DEVICE KIT See admin instructions.  0   Cholecalciferol 50 MCG (2000 UT) TABS TAKE ONE TABLET BY MOUTH DAILY FOR LOW VITAMIN D.     dexamethasone  (DECADRON ) 4 MG tablet Take 8 mg by mouth daily. For 2 days after chemo     doxepin  (SINEQUAN ) 25 MG capsule Take 1 capsule by mouth at bedtime.     empagliflozin  (JARDIANCE ) 25 MG TABS tablet Take 0.5 tablets by mouth every morning. Only if blood sugars get too high     finasteride  (PROSCAR ) 5 MG tablet Take 0.5 tablets by mouth daily.     gabapentin  (NEURONTIN ) 100 MG capsule Take 1 capsule (100 mg total) by mouth 3 (three) times daily. 90 capsule 1   HYDROmorphone  (DILAUDID ) 2 MG tablet Take 1 tablet (2 mg total) by mouth every 6 (six) hours as needed for up to 15 days for severe pain (  pain score 7-10). 30 tablet 0   Insulin  Glargine (LANTUS  SOLOSTAR) 100 UNIT/ML Solostar Pen INJECT 20 UNITS SUBCUTANEOUSLY AT BEDTIME 3 pen 11   methocarbamol  (ROBAXIN ) 500 MG tablet Take 2 tablets (1,000 mg total) by mouth every 8 (eight) hours as needed for muscle spasms. 60 tablet 1   morphine  (MS CONTIN ) 15 MG 12 hr tablet Take 1 tablet (15 mg total) by mouth every 12 (twelve) hours. 60 tablet 0   ondansetron  (ZOFRAN ) 8 MG tablet Take 1 tablet (8 mg total) by mouth every 8 (eight) hours as needed for nausea or vomiting. Start on  the third day after chemotherapy. 30 tablet 1   ONE TOUCH ULTRA TEST test strip TEST GLUCOSE 3 TIMES A DAY 300 each 1   ONETOUCH DELICA LANCETS 33G MISC TEST GLUCOSE 3 TIMES A DAY 300 each 1   polyethylene glycol powder (GLYCOLAX /MIRALAX ) 17 GM/SCOOP powder Take 17 g dissolved in liquid by mouth daily. 476 g 2   prochlorperazine  (COMPAZINE ) 10 MG tablet Take 1 tablet (10 mg total) by mouth every 6 (six) hours as needed for nausea or vomiting. 30 tablet 1   senna (SENOKOT) 8.6 MG TABS tablet Take 1 tablet (8.6 mg total) by mouth 2 (two) times daily. 60 tablet 2   simvastatin  (ZOCOR ) 20 MG tablet TAKE 1 TABLET (20 MG TOTAL) BY MOUTH AT BEDTIME. 100 tablet 4   No current facility-administered medications for this visit.    VITAL SIGNS: BP (!) 108/58 (BP Location: Left Arm, Patient Position: Sitting) Comment: provider notified  Pulse (!) 120 Comment: provider notified  Temp 98.7 F (37.1 C) (Temporal)   Resp 18   Ht 5' 8 (1.727 m)   Wt 124 lb 14.4 oz (56.7 kg)   SpO2 99%   BMI 18.99 kg/m  Filed Weights   09/22/23 0953  Weight: 124 lb 14.4 oz (56.7 kg)    Estimated body mass index is 18.99 kg/m as calculated from the following:   Height as of this encounter: 5' 8 (1.727 m).   Weight as of this encounter: 124 lb 14.4 oz (56.7 kg).   PERFORMANCE STATUS (ECOG) : 1 - Symptomatic but completely ambulatory   Physical Exam General: NAD Cardiovascular: regular rate and rhythm Pulmonary: normal breathing pattern Extremities: no edema, no joint deformities Skin: no rashes Neurological: AAO x3  IMPRESSION: Discussed the use of AI scribe software for clinical note transcription with the patient, who gave verbal consent to proceed.  History of Present Illness Tyler Scott is a 68 year old male who presents to clinic for follow-up.  No acute distress noted.  Patient is accompanied by his wife.  Denies concerns for nausea, constipation, or diarrhea.  Persistent fatigue however he  tries to remain as active as possible.  He has a decreased appetite and sometimes feels 'filled up with nausea pills,' although his weight has remained stable at 124 pounds since July. He reports sleeping well. He experiences constipation, with bowel movements occurring every three to four days. He takes a stool softener every other day to manage this.  Education provided on the importance of bowel regimen in the setting of opioid use.  Patient is aware the goal is to have a bowel movement every 1 to 2 days.  Encouraged him to begin taking stool softener (MiraLAX ) at least once daily to facilitate this.  He and wife verbalized understanding.  Mr. Golubski experiences pain with a current severity of 7 out of 10. His pain management regimen  includes hydromorphone  2 mg every six hours as needed for breakthrough pain, primarily taken at night, and morphine  ER 15 mg every twelve hours. He is also prescribed gabapentin , intended to be taken three times a day, but he only takes it once daily.  He reports current regimen is controlling his pain.  No adjustments at this time.  All questions answered and support provided.  Assessment & Plan Cancer Pain  Cancer pain with a severity of 7/10 at times, managed with gabapentin , hydromorphone , and morphine . Gabapentin  is taken once daily instead of the prescribed three times daily. Hydromorphone  is used for breakthrough pain, primarily at night. Morphine  is taken every twelve hours. - Instruct to take gabapentin  100 mg three times daily as prescribed. - Continue hydromorphone  2 mg every six hours as needed for breakthrough pain. - Continue morphine  15mg  every twelve hours.  Constipation Experiencing constipation with bowel movements every three to four days. Currently taking a stool softener every other day. - Instruct to take stool softener daily to improve bowel movement frequency.  Poor appetite Reports poor appetite and occasional nausea. Weight remains at  124 lbs since July. - Scheduled to see a dietitian on October 06, 2023.  I will plan to see patient back in 2-3 weeks.   Patient expressed understanding and was in agreement with this plan. He also understands that He can call the clinic at any time with any questions, concerns, or complaints.   Any controlled substances utilized were prescribed in the context of palliative care. PDMP has been reviewed.   Visit consisted of counseling and education dealing with the complex and emotionally intense issues of symptom management and palliative care in the setting of serious and potentially life-threatening illness.  Levon Borer, AGPCNP-BC  Palliative Medicine Team/[facility]

## 2023-09-29 ENCOUNTER — Inpatient Hospital Stay

## 2023-09-29 ENCOUNTER — Telehealth: Payer: Self-pay

## 2023-09-29 ENCOUNTER — Ambulatory Visit: Admitting: Hematology

## 2023-09-29 ENCOUNTER — Encounter: Payer: Self-pay | Admitting: Hematology

## 2023-09-29 ENCOUNTER — Other Ambulatory Visit (HOSPITAL_COMMUNITY): Payer: Self-pay

## 2023-09-29 ENCOUNTER — Other Ambulatory Visit: Payer: Self-pay

## 2023-09-29 ENCOUNTER — Telehealth: Payer: Self-pay | Admitting: Pharmacist

## 2023-09-29 ENCOUNTER — Ambulatory Visit

## 2023-09-29 ENCOUNTER — Other Ambulatory Visit

## 2023-09-29 DIAGNOSIS — R768 Other specified abnormal immunological findings in serum: Secondary | ICD-10-CM

## 2023-09-29 MED ORDER — VEMLIDY 25 MG PO TABS: 25.0000 mg | ORAL_TABLET | Freq: Every day | ORAL | 2 refills | Status: DC

## 2023-09-29 MED ORDER — VEMLIDY 25 MG PO TABS: 25.0000 mg | ORAL_TABLET | Freq: Every day | ORAL | 5 refills | Status: DC

## 2023-09-29 NOTE — Progress Notes (Signed)
 HEMATOLOGY/ONCOLOGY CLINIC NOTE  Date of Service: .09/22/2023   Patient Care Team: [name], MD as PCP - General (Family Medicine) [name], MD (Inactive) Pickenpack-Cousar, Fannie SAILOR, NP as Nurse Practitioner [facility] and Palliative Medicine)  CHIEF COMPLAINTS/PURPOSE OF CONSULTATION:  Continued evaluation and management of CLL for toxicity check after first cycle of Bendamustine  Rituxan  chemotherapy  DIAGNOSIS:   Prior therapy:    Bendamustine  and rituximab  started in July 2017 and received total of 2 cycles. Therapy discontinued in August 2017 based on patient's wishes after achieving partial response.   He developed a relapse disease in April 2019. Ibrutinib  420 mg daily started in April 2019 and discontinued in September 2023 after poor adherence.   Calquence  100 mg twice daily started in September 2023.changed due to progression  Switch to Gazyva  venetoclax  but had several breaks in treatment due to epididymoorchitis patient no-shows and could not really get started on venetoclax .  Was admitted to the hospital with significant serum progression and restarted on Bendamustine  Rituxan .  HISTORY OF PRESENTING ILLNESS:  Tyler Scott is a wonderful 68 y.o. male who has been a previous patient of Dr. Amadeo. He is here for evaluation and management of CLL.   Patient was last seen by Dr. Amadeo on 02/18/2022 and was doing well overall with no new medical concerns.  Today, he reports that he has been on and off different treatment since 2017, but has been on Calquence  since September, 2023. He does admit that he does forget to take this medication a couple times a week. He reports that his previous treatment of Ibrutinib  did cause him diarrhea.  He did receive two previous injection in his buttocks, but is unsure it is was steroids or antibiotics. Patient is UTD with influenza, RSV, and COVID-19 vaccinations. He denies any abdominal pain, but does note  some discomfort along the spleen. He denies any bleeding issues, chest pain, diarrhea, resolved lumps/bumps in neck, infection issues, fevers, or chills.  He does note some SOB related to allergies and chest/sinus congestion. His symptoms are improving. He also reports that his neck edema had improved. He does complain of sleep deprivation/poor sleep habits and notes some stress. He does typically sleep during the day.  He does take vitamin D and C supplements regularly. On average, he does drink 1 glass of wine a day and denies any major medication changes.  INTERVAL HISTORY:  Tyler Scott is a  68 y.o. male who is here for continued valuation management of his CLL and for toxicity check after first cycle of Bendamustine  Rituxan . He notes that his severe back pain and abdominal pain have significantly improved.  He still notes that he feels fatigued and has had somewhat limited p.o. of food and fluid intake.  He has not really started working with physical therapy at since he told him to hold off till he feels better.  We discussed that working with physical therapy so that he does not get available chair or bed bound is going to be important as his CLL is treated.. She notes no nausea or vomiting.  No fevers no chills no night sweats.  Improved abdominal pain and back pain.  No new leg swelling. No new infusion reactions.. Still needing close monitoring of his anemia and thrombocytopenia. On weekly labs with monitoring on Nplate  therapy.  MEDICAL HISTORY:  Past Medical History:  Diagnosis Date   Allergy    Arthritis    CLL (chronic lymphocytic leukemia) (HCC)  stage III: asymptomatic; on observation   DIABETES MELLITUS, TYPE I, UNCONTROLLED 12/30/2006   insulin    Diabetic retinopathy    ERECTILE DYSFUNCTION, ORGANIC 12/30/2006   Hx of adenomatous polyp of colon 05/24/2014   HYPERLIPIDEMIA 12/30/2006   HYPERTENSION 12/30/2006   LOW BACK PAIN, ACUTE 12/21/2006    SURGICAL  HISTORY: Past Surgical History:  Procedure Laterality Date   COLONOSCOPY     great right toe- Fused the I P joint      SOCIAL HISTORY: Social History   Socioeconomic History   Marital status: Married    Spouse name: Not on file   Number of children: Not on file   Years of education: Not on file   Highest education level: Not on file  Occupational History   Not on file  Tobacco Use   Smoking status: Never   Smokeless tobacco: Never  Vaping Use   Vaping status: Never Used  Substance and Sexual Activity   Alcohol use: Yes    Alcohol/week: 4.0 standard drinks of alcohol    Types: 4 Glasses of wine per week   Drug use: No   Sexual activity: Not on file    Comment: regular exercise - yes  Other Topics Concern   Not on file  Social History Narrative   Not on file   Social Drivers of Health   Financial Resource Strain: Not on file  Food Insecurity: No Food Insecurity (08/27/2023)   Hunger Vital Sign    Worried About Running Out of Food in the Last Year: Never true    Ran Out of Food in the Last Year: Never true  Transportation Needs: No Transportation Needs (08/27/2023)   PRAPARE - Administrator, Civil Service (Medical): No    Lack of Transportation (Non-Medical): No  Physical Activity: Not on file  Stress: Not on file  Social Connections: Moderately Isolated (08/27/2023)   Social Connection and Isolation Panel    Frequency of Communication with Friends and Family: More than three times a week    Frequency of Social Gatherings with Friends and Family: Once a week    Attends Religious Services: Never    Database administrator or Organizations: No    Attends Banker Meetings: Never    Marital Status: Married  Catering manager Violence: Not At Risk (08/27/2023)   Humiliation, Afraid, Rape, and Kick questionnaire    Fear of Current or Ex-Partner: No    Emotionally Abused: No    Physically Abused: No    Sexually Abused: No    FAMILY  HISTORY: Family History  Problem Relation Age of Onset   Alcohol abuse Other    Colon cancer Brother 52       Died October 09, 2013   Diabetes Mellitus II Mother    Esophageal cancer Neg Hx    Rectal cancer Neg Hx    Stomach cancer Neg Hx     ALLERGIES:  has no known allergies.  MEDICATIONS:  Current Outpatient Medications  Medication Sig Dispense Refill   acyclovir  (ZOVIRAX ) 400 MG tablet Take 1 tablet (400 mg total) by mouth daily. 30 tablet 3   allopurinol  (ZYLOPRIM ) 300 MG tablet Take 0.5 tablets (150 mg total) by mouth daily. 30 tablet 3   amLODipine  (NORVASC ) 10 MG tablet Take 1 tablet (10 mg total) by mouth daily for 10 days. 30 tablet 0   Blood Glucose Monitoring Suppl (ONE TOUCH ULTRA 2) W/DEVICE KIT See admin instructions.  0   Cholecalciferol 50  MCG (2000 UT) TABS TAKE ONE TABLET BY MOUTH DAILY FOR LOW VITAMIN D.     dexamethasone  (DECADRON ) 4 MG tablet Take 8 mg by mouth daily. For 2 days after chemo     doxepin  (SINEQUAN ) 25 MG capsule Take 1 capsule by mouth at bedtime.     empagliflozin  (JARDIANCE ) 25 MG TABS tablet Take 0.5 tablets by mouth every morning. Only if blood sugars get too high     finasteride  (PROSCAR ) 5 MG tablet Take 0.5 tablets by mouth daily.     gabapentin  (NEURONTIN ) 100 MG capsule Take 1 capsule (100 mg total) by mouth 3 (three) times daily. 90 capsule 1   Insulin  Glargine (LANTUS  SOLOSTAR) 100 UNIT/ML Solostar Pen INJECT 20 UNITS SUBCUTANEOUSLY AT BEDTIME 3 pen 11   methocarbamol  (ROBAXIN ) 500 MG tablet Take 2 tablets (1,000 mg total) by mouth every 8 (eight) hours as needed for muscle spasms. 60 tablet 1   morphine  (MS CONTIN ) 15 MG 12 hr tablet Take 1 tablet (15 mg total) by mouth every 12 (twelve) hours. 60 tablet 0   ondansetron  (ZOFRAN ) 8 MG tablet Take 1 tablet (8 mg total) by mouth every 8 (eight) hours as needed for nausea or vomiting. Start on the third day after chemotherapy. 30 tablet 1   ONE TOUCH ULTRA TEST test strip TEST GLUCOSE 3 TIMES A DAY  300 each 1   ONETOUCH DELICA LANCETS 33G MISC TEST GLUCOSE 3 TIMES A DAY 300 each 1   polyethylene glycol powder (GLYCOLAX /MIRALAX ) 17 GM/SCOOP powder Take 17 g dissolved in liquid by mouth daily. 476 g 2   prochlorperazine  (COMPAZINE ) 10 MG tablet Take 1 tablet (10 mg total) by mouth every 6 (six) hours as needed for nausea or vomiting. 30 tablet 1   senna (SENOKOT) 8.6 MG TABS tablet Take 1 tablet (8.6 mg total) by mouth 2 (two) times daily. 60 tablet 2   simvastatin  (ZOCOR ) 20 MG tablet TAKE 1 TABLET (20 MG TOTAL) BY MOUTH AT BEDTIME. 100 tablet 4   No current facility-administered medications for this visit.    REVIEW OF SYSTEMS:    10 Point review of Systems was done is negative except as noted above.   PHYSICAL EXAMINATION: ECOG PERFORMANCE STATUS: 2-3 VSS  GENERAL:alert, in no acute distress and comfortable SKIN: no acute rashes, no significant lesions EYES: conjunctiva are pink and non-injected, sclera anicteric OROPHARYNX: MMM, no exudates, no oropharyngeal erythema or ulceration NECK: supple, no JVD LYMPH:  no palpable lymphadenopathy in the cervical, axillary or inguinal regions LUNGS: clear to auscultation b/l with normal respiratory effort HEART: regular rate & rhythm ABDOMEN:  normoactive bowel sounds , non tender, not distended.  Mild palpable splenomegaly Extremity: no pedal edema PSYCH: alert & oriented x 3 with fluent speech NEURO: no focal motor/sensory deficits   LABORATORY DATA:  I have reviewed the data as listed .SABRA    Latest Ref Rng & Units 09/22/2023    9:27 AM 09/14/2023    8:33 AM 09/08/2023    8:57 AM  CBC  WBC 4.0 - 10.5 K/uL 10.3  9.8  29.9   Hemoglobin 13.0 - 17.0 g/dL 9.1  7.3  8.3   Hematocrit 39.0 - 52.0 % 27.7  22.4  25.8   Platelets 150 - 400 K/uL 60  62  80    .    Latest Ref Rng & Units 09/22/2023    9:27 AM 09/14/2023    8:33 AM 09/08/2023    8:57 AM  CMP  Glucose 70 - 99 mg/dL 893  856  807   BUN 8 - 23 mg/dL 17  33  38   Creatinine  0.61 - 1.24 mg/dL 8.80  8.72  8.54   Sodium 135 - 145 mmol/L 139  137  135   Potassium 3.5 - 5.1 mmol/L 3.9  4.5  5.0   Chloride 98 - 111 mmol/L 103  99  97   CO2 22 - 32 mmol/L 29  29  30    Calcium 8.9 - 10.3 mg/dL 8.5  89.2  88.6   Total Protein 6.5 - 8.1 g/dL 6.4  7.0  7.5   Total Bilirubin 0.0 - 1.2 mg/dL 1.2  1.0  1.0   Alkaline Phos 38 - 126 U/L 139  112  134   AST 15 - 41 U/L 128  25  19   ALT 0 - 44 U/L 82  10  8     . RADIOGRAPHIC STUDIES: I have personally reviewed the radiological images as listed and agreed with the findings in the report. No results found.    ASSESSMENT & PLAN:   68 year old man with:      1.  CLL /SLL diagnosed in 2012 status post multiple lines of treatment as noted above. Currently with significant CLL progression with bulky abdominal adenopathy causing abdominal pain and back pain. Retroperitoneal adenopathy was rebiopsied to confirm SLL and rule out large cell transformation.  2.  Anemia and thrombocytopenia likely related to CLL and poor p.o. intake.  3.  Hypercalcemia likely related to CLL progression and dehydration calcium levels are normalized after first treatment with Bendamustine  Rituxan .  4.  Poor functional status due to poor p.o. intake.  Patient has been doing significant motivation to try to work with physical therapy.  5.  History of left-sided epididymoorchitis.  6.  Abnormal liver function tests could be from Bendamustine  or Rituxan  or Nplate . Patient has been previously negative for hepatitis B serologies but recently turned positive for hepatitis B core antibody does have B DNA is undetectable.  Will refer to ID for consideration of hep B suppressive therapy in the context of Rituxan  use though recent hep B positivity suggests possibly false positive antibody production by his underlying CLL.  PLAN: - Discussed lab results from today.  Hemoglobin is better at 9.1, platelets remain low at 60k.  His WBC counts have  normalized, however about 30k. - CMP shows resolution of hypercalcemia.  Does note elevated liver enzymes. - Continue weekly labs with Nplate  as needed for platelets less than 100k to allow for continued Bendamustine  chemotherapy in the context of likely ITP related to CLL. - Referral to infectious disease for consideration of hep B suppressive therapy.  Plan to start Vemlidy  25 mg p.o. daily in the interim.  Prescription sent to [facility] outpatient pharmacy - Recommended patient optimize his p.o. food and fluid intake and allow physical therapy to come to his home for initiating outpatient physical therapy to maintain his functional status. - We discussed that his functional status will have a large bearing on the treatments he can tolerate. - Were hoping to get some decent response with 2-4 cycles of Bendamustine  Rituxan  and we will switch him back to venetoclax . - FOLLOW-UP:  Plz schedule weekly labs and NPlate  Plz schedule C2 of BR with labs on C2D1 and MD visit  ID consultation for hepatitis B core antibody positivity  The total time spent in the appointment was 40 minutes*.  All of the patient's  questions were answered with apparent satisfaction. The patient knows to call the clinic with any problems, questions or concerns.   Emaline Saran MD MS AAHIVMS [facility] Hematology/Oncology Physician [facility]  .*Total Encounter Time as defined by the Centers for Medicare and Medicaid Services includes, in addition to the face-to-face time of a patient visit (documented in the note above) non-face-to-face time: obtaining and reviewing outside history, ordering and reviewing medications, tests or procedures, care coordination (communications with other health care professionals or caregivers) and documentation in the medical record.

## 2023-09-29 NOTE — Telephone Encounter (Signed)
 Received urgent message from oncology pharmacist as patient has been referred to ID given need for hepatitis B prophylaxis while on Rituxan  therapy. I see ID referral is currently under review. Spoke with Dr. Fleeta Rothman about patient who agrees that patient needs to be started on HBV prophylaxis ASAP.   [facility] team - can front/triage reach out to him about getting scheduled with me/Cassie ASAP to review counseling and start therapy? We will schedule follow up with an ID provider later but need to make sure he starts this medication as soon as able. Thank you!   Alan Geralds, PharmD, CPP, BCIDP, AAHIVP Clinical Pharmacist Practitioner Infectious Diseases Clinical Pharmacist [facility]

## 2023-09-29 NOTE — Telephone Encounter (Signed)
 Called patient to counsel on new Vemlidy  prescription while on rituximab . Informed the patient he will be on this medication once daily to prevent reactivation of his Hepatitis B while on rituximab . We will continue to monitor his renal function and informed patient to call with any concerns. His insurance is not contracted with [employer]. Will see where patient wants to fill.

## 2023-09-29 NOTE — Telephone Encounter (Signed)
 Yes, we will call the patient today and go from there. Thank you everyone!

## 2023-09-29 NOTE — Telephone Encounter (Signed)
 Spoke with Taft, he is not able to come in today. He was wondering if this would be something he can do over the phone.   Sheilia Reznick, BSN, RN

## 2023-09-30 ENCOUNTER — Other Ambulatory Visit: Payer: Self-pay

## 2023-09-30 ENCOUNTER — Other Ambulatory Visit: Payer: Self-pay | Admitting: Pharmacist

## 2023-09-30 ENCOUNTER — Ambulatory Visit

## 2023-09-30 DIAGNOSIS — C9110 Chronic lymphocytic leukemia of B-cell type not having achieved remission: Secondary | ICD-10-CM

## 2023-09-30 DIAGNOSIS — R768 Other specified abnormal immunological findings in serum: Secondary | ICD-10-CM

## 2023-09-30 MED ORDER — VEMLIDY 25 MG PO TABS: 25.0000 mg | ORAL_TABLET | Freq: Every day | ORAL | 5 refills | Status: DC

## 2023-09-30 NOTE — Telephone Encounter (Signed)
 Patient returned call this morning confirming he would like medication delivered from [facility].  Alan Geralds, PharmD, CPP, BCIDP, AAHIVP Clinical Pharmacist Practitioner Infectious Diseases Clinical Pharmacist [facility] for Infectious Disease

## 2023-09-30 NOTE — Progress Notes (Signed)
 Resending Vemlidy  to [employer] per [employer] and patient preference.   Alan Geralds, PharmD, CPP, BCIDP, AAHIVP Clinical Pharmacist Practitioner Infectious Diseases Clinical Pharmacist [facility] for Infectious Disease

## 2023-10-01 ENCOUNTER — Inpatient Hospital Stay

## 2023-10-01 ENCOUNTER — Inpatient Hospital Stay: Admitting: Licensed Clinical Social Worker

## 2023-10-01 VITALS — BP 106/57 | HR 113 | Temp 98.2°F | Resp 18

## 2023-10-01 DIAGNOSIS — C9110 Chronic lymphocytic leukemia of B-cell type not having achieved remission: Secondary | ICD-10-CM

## 2023-10-01 DIAGNOSIS — D693 Immune thrombocytopenic purpura: Secondary | ICD-10-CM

## 2023-10-01 DIAGNOSIS — Z5112 Encounter for antineoplastic immunotherapy: Secondary | ICD-10-CM | POA: Diagnosis not present

## 2023-10-01 LAB — CMP (CANCER CENTER ONLY)
ALT: 20 U/L (ref 0–44)
AST: 42 U/L — ABNORMAL HIGH (ref 15–41)
Albumin: 3.6 g/dL (ref 3.5–5.0)
Alkaline Phosphatase: 177 U/L — ABNORMAL HIGH (ref 38–126)
Anion gap: 8 (ref 5–15)
BUN: 20 mg/dL (ref 8–23)
CO2: 26 mmol/L (ref 22–32)
Calcium: 8.3 mg/dL — ABNORMAL LOW (ref 8.9–10.3)
Chloride: 103 mmol/L (ref 98–111)
Creatinine: 1.06 mg/dL (ref 0.61–1.24)
GFR, Estimated: >60 mL/min (ref >60)
Glucose, Bld: 154 mg/dL — ABNORMAL HIGH (ref 70–99)
Potassium: 4.3 mmol/L (ref 3.5–5.1)
Sodium: 137 mmol/L (ref 135–145)
Total Bilirubin: 1 mg/dL (ref 0.0–1.2)
Total Protein: 5.8 g/dL — ABNORMAL LOW (ref 6.5–8.1)

## 2023-10-01 LAB — CBC WITH DIFFERENTIAL (CANCER CENTER ONLY)
Abs Immature Granulocytes: 0.1 K/uL — ABNORMAL HIGH (ref 0.00–0.07)
Basophils Absolute: 0 K/uL (ref 0.0–0.1)
Basophils Relative: 0 %
Eosinophils Absolute: 0.4 K/uL (ref 0.0–0.5)
Eosinophils Relative: 3 %
HCT: 24.8 % — ABNORMAL LOW (ref 39.0–52.0)
Hemoglobin: 8.1 g/dL — ABNORMAL LOW (ref 13.0–17.0)
Lymphocytes Relative: 75 %
Lymphs Abs: 9.5 K/uL — ABNORMAL HIGH (ref 0.7–4.0)
MCH: 30 pg (ref 26.0–34.0)
MCHC: 32.7 g/dL (ref 30.0–36.0)
MCV: 91.9 fL (ref 80.0–100.0)
Metamyelocytes Relative: 1 %
Monocytes Absolute: 0.6 K/uL (ref 0.1–1.0)
Monocytes Relative: 5 %
Neutro Abs: 2 K/uL (ref 1.7–7.7)
Neutrophils Relative %: 16 %
Platelet Count: 47 K/uL — ABNORMAL LOW (ref 150–400)
RBC: 2.7 MIL/uL — ABNORMAL LOW (ref 4.22–5.81)
RDW: 18.8 % — ABNORMAL HIGH (ref 11.5–15.5)
Smear Review: NORMAL
WBC Count: 12.7 K/uL — ABNORMAL HIGH (ref 4.0–10.5)
nRBC: 0.3 % — ABNORMAL HIGH (ref 0.0–0.2)

## 2023-10-01 LAB — SAMPLE TO BLOOD BANK

## 2023-10-01 MED ORDER — ROMIPLOSTIM 125 MCG ~~LOC~~ SOLR
2.0000 ug/kg | Freq: Once | SUBCUTANEOUS | Status: AC
  Administered 2023-10-01: 115 ug via SUBCUTANEOUS
  Filled 2023-10-01: qty 0.23

## 2023-10-01 NOTE — Progress Notes (Signed)
 [facility] Healthcare Advance Directives Clinical Social Work  Patient presented to Advance Directives Clinic  to review and complete healthcare advance directives.  Clinical Social Worker met with patient and pt's wife.  The patient designated Jadd Gasior (spouse) as their primary healthcare agent and did not name a secondary agent.  Patient also completed healthcare living will.    Documents were notarized and copies made for patient/family. Clinical Social Worker will send documents to medical records to be scanned into patient's chart. Clinical Social Worker encouraged patient/family to contact with any additional questions or concerns.   Draycen Leichter E Leasha Goldberger, LCSW Clinical Social Worker [facility]

## 2023-10-04 ENCOUNTER — Ambulatory Visit: Admitting: Pharmacist

## 2023-10-06 ENCOUNTER — Inpatient Hospital Stay (HOSPITAL_BASED_OUTPATIENT_CLINIC_OR_DEPARTMENT_OTHER): Admitting: Nurse Practitioner

## 2023-10-06 ENCOUNTER — Inpatient Hospital Stay

## 2023-10-06 ENCOUNTER — Inpatient Hospital Stay: Admitting: Dietician

## 2023-10-06 ENCOUNTER — Other Ambulatory Visit: Payer: Self-pay | Admitting: Hematology

## 2023-10-06 ENCOUNTER — Inpatient Hospital Stay: Admitting: Hematology

## 2023-10-06 ENCOUNTER — Encounter: Payer: Self-pay | Admitting: Nurse Practitioner

## 2023-10-06 VITALS — BP 98/61 | HR 122 | Temp 98.1°F | Resp 18 | Ht 68.0 in | Wt 123.5 lb

## 2023-10-06 VITALS — BP 128/68 | HR 99 | Temp 98.7°F | Resp 17

## 2023-10-06 DIAGNOSIS — Z789 Other specified health status: Secondary | ICD-10-CM | POA: Diagnosis not present

## 2023-10-06 DIAGNOSIS — C9110 Chronic lymphocytic leukemia of B-cell type not having achieved remission: Secondary | ICD-10-CM

## 2023-10-06 DIAGNOSIS — D693 Immune thrombocytopenic purpura: Secondary | ICD-10-CM

## 2023-10-06 DIAGNOSIS — R530 Neoplastic (malignant) related fatigue: Secondary | ICD-10-CM | POA: Diagnosis not present

## 2023-10-06 DIAGNOSIS — G893 Neoplasm related pain (acute) (chronic): Secondary | ICD-10-CM | POA: Diagnosis not present

## 2023-10-06 DIAGNOSIS — Z515 Encounter for palliative care: Secondary | ICD-10-CM

## 2023-10-06 DIAGNOSIS — Z5112 Encounter for antineoplastic immunotherapy: Secondary | ICD-10-CM | POA: Diagnosis not present

## 2023-10-06 LAB — CBC WITH DIFFERENTIAL (CANCER CENTER ONLY)
Abs Immature Granulocytes: 0.15 K/uL — ABNORMAL HIGH (ref 0.00–0.07)
Basophils Absolute: 0.1 K/uL (ref 0.0–0.1)
Basophils Relative: 1 %
Eosinophils Absolute: 0.2 K/uL (ref 0.0–0.5)
Eosinophils Relative: 1 %
HCT: 24.8 % — ABNORMAL LOW (ref 39.0–52.0)
Hemoglobin: 8.2 g/dL — ABNORMAL LOW (ref 13.0–17.0)
Immature Granulocytes: 1 %
Lymphocytes Relative: 72 %
Lymphs Abs: 12.4 K/uL — ABNORMAL HIGH (ref 0.7–4.0)
MCH: 30.5 pg (ref 26.0–34.0)
MCHC: 33.1 g/dL (ref 30.0–36.0)
MCV: 92.2 fL (ref 80.0–100.0)
Monocytes Absolute: 2 K/uL — ABNORMAL HIGH (ref 0.1–1.0)
Monocytes Relative: 12 %
Neutro Abs: 2.3 K/uL (ref 1.7–7.7)
Neutrophils Relative %: 13 %
Platelet Count: 47 K/uL — ABNORMAL LOW (ref 150–400)
RBC: 2.69 MIL/uL — ABNORMAL LOW (ref 4.22–5.81)
RDW: 19.6 % — ABNORMAL HIGH (ref 11.5–15.5)
WBC Count: 17.1 K/uL — ABNORMAL HIGH (ref 4.0–10.5)
nRBC: 0.2 % (ref 0.0–0.2)

## 2023-10-06 LAB — CMP (CANCER CENTER ONLY)
ALT: 12 U/L (ref 0–44)
AST: 26 U/L (ref 15–41)
Albumin: 3.5 g/dL (ref 3.5–5.0)
Alkaline Phosphatase: 166 U/L — ABNORMAL HIGH (ref 38–126)
Anion gap: 9 (ref 5–15)
BUN: 15 mg/dL (ref 8–23)
CO2: 27 mmol/L (ref 22–32)
Calcium: 8.1 mg/dL — ABNORMAL LOW (ref 8.9–10.3)
Chloride: 100 mmol/L (ref 98–111)
Creatinine: 1.07 mg/dL (ref 0.61–1.24)
GFR, Estimated: >60 mL/min (ref >60)
Glucose, Bld: 252 mg/dL — ABNORMAL HIGH (ref 70–99)
Potassium: 4.4 mmol/L (ref 3.5–5.1)
Sodium: 136 mmol/L (ref 135–145)
Total Bilirubin: 1.1 mg/dL (ref 0.0–1.2)
Total Protein: 5.7 g/dL — ABNORMAL LOW (ref 6.5–8.1)

## 2023-10-06 MED ORDER — SODIUM CHLORIDE 0.9 % IV SOLN
70.0000 mg/m2 | Freq: Once | INTRAVENOUS | Status: AC
  Administered 2023-10-06: 125 mg via INTRAVENOUS
  Filled 2023-10-06: qty 5

## 2023-10-06 MED ORDER — ROMIPLOSTIM 250 MCG ~~LOC~~ SOLR
3.0000 ug/kg | Freq: Once | SUBCUTANEOUS | Status: AC
  Administered 2023-10-06: 170 ug via SUBCUTANEOUS
  Filled 2023-10-06: qty 0.34

## 2023-10-06 MED ORDER — DIPHENHYDRAMINE HCL 25 MG PO CAPS
50.0000 mg | ORAL_CAPSULE | Freq: Once | ORAL | Status: AC
  Administered 2023-10-06: 50 mg via ORAL
  Filled 2023-10-06: qty 2

## 2023-10-06 MED ORDER — DEXAMETHASONE SODIUM PHOSPHATE 10 MG/ML IJ SOLN
10.0000 mg | Freq: Once | INTRAMUSCULAR | Status: AC
  Administered 2023-10-06: 10 mg via INTRAVENOUS
  Filled 2023-10-06: qty 1

## 2023-10-06 MED ORDER — SODIUM CHLORIDE 0.9 % IV SOLN
600.0000 mg | Freq: Once | INTRAVENOUS | Status: AC
  Administered 2023-10-06: 600 mg via INTRAVENOUS
  Filled 2023-10-06: qty 50

## 2023-10-06 MED ORDER — ACETAMINOPHEN 325 MG PO TABS
650.0000 mg | ORAL_TABLET | Freq: Once | ORAL | Status: AC
  Administered 2023-10-06: 650 mg via ORAL
  Filled 2023-10-06: qty 2

## 2023-10-06 MED ORDER — SODIUM CHLORIDE 0.9 % IV SOLN: INTRAVENOUS | Status: DC

## 2023-10-06 MED ORDER — HYDROMORPHONE HCL 2 MG PO TABS
2.0000 mg | ORAL_TABLET | Freq: Four times a day (QID) | ORAL | 0 refills | Status: DC | PRN
Start: 2023-10-06 — End: 2023-10-23

## 2023-10-06 MED ORDER — METHOCARBAMOL 500 MG PO TABS: 500.0000 mg | ORAL_TABLET | Freq: Three times a day (TID) | ORAL | 1 refills | Status: DC | PRN

## 2023-10-06 MED ORDER — MORPHINE SULFATE ER 15 MG PO TBCR: 15.0000 mg | EXTENDED_RELEASE_TABLET | Freq: Two times a day (BID) | ORAL | 0 refills | Status: DC

## 2023-10-06 MED ORDER — PALONOSETRON HCL INJECTION 0.25 MG/5ML
0.2500 mg | Freq: Once | INTRAVENOUS | Status: AC
  Administered 2023-10-06: 0.25 mg via INTRAVENOUS
  Filled 2023-10-06: qty 5

## 2023-10-06 NOTE — Patient Instructions (Signed)
 [facility] - A DEPT OF [facility]  Discharge Instructions: Thank you for choosing [facility] to provide your oncology and hematology care.   If you have a lab appointment with the Cancer Center, please go directly to the Cancer Center and check in at the registration area.   Wear comfortable clothing and clothing appropriate for easy access to any Portacath or PICC line.   We strive to give you quality time with your provider. You may need to reschedule your appointment if you arrive late (15 or more minutes).  Arriving late affects you and other patients whose appointments are after yours.  Also, if you miss three or more appointments without notifying the office, you may be dismissed from the clinic at the provider's discretion.      For prescription refill requests, have your pharmacy contact our office and allow 72 hours for refills to be completed.    Today you received the following chemotherapy and/or immunotherapy agents: Rituximab -pvvr (Ruxience ) & Bendamustine  (Bendeka )    To help prevent nausea and vomiting after your treatment, we encourage you to take your nausea medication as directed.  BELOW ARE SYMPTOMS THAT SHOULD BE REPORTED IMMEDIATELY: *FEVER GREATER THAN 100.4 F (38 C) OR HIGHER *CHILLS OR SWEATING *NAUSEA AND VOMITING THAT IS NOT CONTROLLED WITH YOUR NAUSEA MEDICATION *UNUSUAL SHORTNESS OF BREATH *UNUSUAL BRUISING OR BLEEDING *URINARY PROBLEMS (pain or burning when urinating, or frequent urination) *BOWEL PROBLEMS (unusual diarrhea, constipation, pain near the anus) TENDERNESS IN MOUTH AND THROAT WITH OR WITHOUT PRESENCE OF ULCERS (sore throat, sores in mouth, or a toothache) UNUSUAL RASH, SWELLING OR PAIN  UNUSUAL VAGINAL DISCHARGE OR ITCHING   Items with * indicate a potential emergency and should be followed up as soon as possible or go to the Emergency Department if any problems should occur.  Please show the  CHEMOTHERAPY ALERT CARD or IMMUNOTHERAPY ALERT CARD at check-in to the Emergency Department and triage nurse.  Should you have questions after your visit or need to cancel or reschedule your appointment, please contact [facility] - A DEPT OF [facility]  Dept: [phone]  and follow the prompts.  Office hours are 8:00 a.m. to 4:30 p.m. Monday - Friday. Please note that voicemails left after 4:00 p.m. may not be returned until the following business day.  We are closed weekends and major holidays. You have access to a nurse at all times for urgent questions. Please call the main number to the clinic Dept: [phone] and follow the prompts.   For any non-urgent questions, you may also contact your provider using MyChart. We now offer e-Visits for anyone 61 and older to request care online for non-urgent symptoms. For details visit [email or web address].   Also download the MyChart app! Go to the app store, search MyChart, open the app, select [place], and log in with your MyChart username and password.

## 2023-10-06 NOTE — Progress Notes (Signed)
 [facility] CSW Progress Note  Visual merchandiser met with patient infusion with clinical intern to follow-up on emotional supports as requested by [employer]. Patient expressed being opened to counseling and discussed briefly previous experiences with counseling . Patient discussed decline in energy levels, desire to speak with family members, and challenges with taste. CSW explained clinical services available through the cancer center, patient agreed to initial assessment to determine which services would be helpful.      Follow Up Plan:  CSW will contact patient by phone to schedule initial assessment.    Lizbeth Sprague, LCSW Clinical Social Worker [facility]

## 2023-10-06 NOTE — Progress Notes (Signed)
 Nutrition Assessment   Reason for Assessment: +MST   ASSESSMENT: 68 year old male with relapsed CLL in 2019. S/p ibrutinib  (2019-2023) d/c after poor adherence. Patient receiving Gazyva  + Venetoclax   Past medical history includes arthritis, IDDM2, diabetic retinopathy, HLD, HTN, chronic ITP  6/27-7/9 - admission with back pain s/p LN biopsy revealing low grade lymphoma  Met with patient in infusion. He is hanging in there. Appetite and intake are poor. His taste buds are shot. Patient relating this to there being something wrong with his head. Patient expresses interest in speaking with LCSW for counseling. He has requested mental health services through the [place], however this has not been beneficial. Patient reports being offered group therapy which he is not interested in.   Patient reports having good support from wife at home. She is encouraging him to eat, but patient unable to tolerate more than a few bites. The more he has to chew, the worse it taste. Recalls raisin nut brand + frosted flakes mix with 2% milk this morning. Patient drinks Boost/Ensure when encouraged by wife.   Patient is on bowel regimen for constipation. Had a small BM this morning. Reports frequent nausea without vomiting. He takes zofran  as needed.    Nutrition Focused Physical Exam:   Orbital Region: severe Buccal Region: severe Upper Arm Region: severe Thoracic and Lumbar Region: uta Temple Region: moderate Clavicle Bone Region: severe Shoulder and Acromion Bone Region: moderate Scapular Bone Region: uta Dorsal Hand: moderate  Patellar Region: uta Anterior Thigh Region: uta Posterior Calf Region: uta Edema (RD assessment): uta Hair: reviewed  Eyes: uta - wearing dark shades d/t retinopathy  Mouth: reviewed  Skin: reviewed (dry) Nails: reviewed    Medications: acyclovir , zyloprim , amlodipine , decadron , doxepin , jardiance , proscar , gabapentin , dilaudid , lantus , robaxin , MS contin , zofran , miralax ,  compazine , senna, zocor , vemlidy    Labs: glucose 252, Ca 8.1, albumin WNL   Anthropometrics:   Height: 5'8 Weight: 123 lb 8 oz UBW: 144 lb (1/25) BMI: 18.78   NUTRITION DIAGNOSIS: Unintended wt loss related to cancer and associated treatment side effects as evidenced by altered taste, decreased intake, 15% wt loss in the last 7 months - severe for time frame   MALNUTRITION DIAGNOSIS: Patient meets criteria for severe malnutrition in the context of chronic illness as evidenced by mod/severe fat/muscle depletion, severe 15% wt loss in 7 months   INTERVENTION:  Educated on side effects of therapy include altered taste/smell, discussed strategies and suggested trying baking soda salt water rinses several times daily and before meals - handout with tips + recipe provided Suggested eating small meals/snacks vs attempting 3 meals/day, encourage soft moist textures for ease of intake - handout with snack ideas + soft moist high protein foods Continue Ensure Plus/equivalent - recommend 2-3/day in between meals  Educated on supplement assistance provided to [employer] via [place] - encourage pt to inquire Referral to [employer] information    MONITORING, EVALUATION, GOAL: Patient will tolerate increased calories and protein to promote wt gain    Next Visit: Wednesday September 24 during infusion

## 2023-10-07 ENCOUNTER — Inpatient Hospital Stay

## 2023-10-07 ENCOUNTER — Other Ambulatory Visit: Payer: Self-pay

## 2023-10-07 ENCOUNTER — Emergency Department (HOSPITAL_COMMUNITY)

## 2023-10-07 ENCOUNTER — Inpatient Hospital Stay (HOSPITAL_COMMUNITY)

## 2023-10-07 ENCOUNTER — Inpatient Hospital Stay (HOSPITAL_COMMUNITY)
Admission: EM | Admit: 2023-10-07 | Discharge: 2023-10-23 | DRG: 682 | Disposition: A | Discharge status: Skilled Nursing Facility | Attending: Internal Medicine | Admitting: Internal Medicine

## 2023-10-07 ENCOUNTER — Encounter (HOSPITAL_COMMUNITY): Payer: Self-pay

## 2023-10-07 ENCOUNTER — Inpatient Hospital Stay: Admitting: Physician Assistant

## 2023-10-07 VITALS — BP 83/49 | HR 115 | Temp 97.7°F | Resp 24

## 2023-10-07 DIAGNOSIS — E860 Dehydration: Secondary | ICD-10-CM | POA: Diagnosis present

## 2023-10-07 DIAGNOSIS — D693 Immune thrombocytopenic purpura: Secondary | ICD-10-CM

## 2023-10-07 DIAGNOSIS — E8729 Other acidosis: Secondary | ICD-10-CM

## 2023-10-07 DIAGNOSIS — E11649 Type 2 diabetes mellitus with hypoglycemia without coma: Secondary | ICD-10-CM | POA: Diagnosis not present

## 2023-10-07 DIAGNOSIS — Z79899 Other long term (current) drug therapy: Secondary | ICD-10-CM

## 2023-10-07 DIAGNOSIS — C9191 Lymphoid leukemia, unspecified, in remission: Secondary | ICD-10-CM

## 2023-10-07 DIAGNOSIS — G8929 Other chronic pain: Secondary | ICD-10-CM | POA: Diagnosis present

## 2023-10-07 DIAGNOSIS — D630 Anemia in neoplastic disease: Secondary | ICD-10-CM | POA: Diagnosis present

## 2023-10-07 DIAGNOSIS — I959 Hypotension, unspecified: Secondary | ICD-10-CM | POA: Diagnosis not present

## 2023-10-07 DIAGNOSIS — J9601 Acute respiratory failure with hypoxia: Secondary | ICD-10-CM | POA: Diagnosis present

## 2023-10-07 DIAGNOSIS — N170 Acute kidney failure with tubular necrosis: Secondary | ICD-10-CM | POA: Diagnosis present

## 2023-10-07 DIAGNOSIS — Z7984 Long term (current) use of oral hypoglycemic drugs: Secondary | ICD-10-CM

## 2023-10-07 DIAGNOSIS — F419 Anxiety disorder, unspecified: Secondary | ICD-10-CM | POA: Diagnosis not present

## 2023-10-07 DIAGNOSIS — D649 Anemia, unspecified: Secondary | ICD-10-CM | POA: Diagnosis not present

## 2023-10-07 DIAGNOSIS — R0902 Hypoxemia: Secondary | ICD-10-CM | POA: Diagnosis not present

## 2023-10-07 DIAGNOSIS — E872 Acidosis, unspecified: Secondary | ICD-10-CM | POA: Diagnosis present

## 2023-10-07 DIAGNOSIS — I10 Essential (primary) hypertension: Secondary | ICD-10-CM | POA: Diagnosis present

## 2023-10-07 DIAGNOSIS — E10319 Type 1 diabetes mellitus with unspecified diabetic retinopathy without macular edema: Secondary | ICD-10-CM | POA: Diagnosis present

## 2023-10-07 DIAGNOSIS — F3289 Other specified depressive episodes: Secondary | ICD-10-CM | POA: Diagnosis not present

## 2023-10-07 DIAGNOSIS — D696 Thrombocytopenia, unspecified: Secondary | ICD-10-CM

## 2023-10-07 DIAGNOSIS — E119 Type 2 diabetes mellitus without complications: Secondary | ICD-10-CM

## 2023-10-07 DIAGNOSIS — Z811 Family history of alcohol abuse and dependence: Secondary | ICD-10-CM

## 2023-10-07 DIAGNOSIS — L8990 Pressure ulcer of unspecified site, unspecified stage: Secondary | ICD-10-CM | POA: Insufficient documentation

## 2023-10-07 DIAGNOSIS — R579 Shock, unspecified: Secondary | ICD-10-CM | POA: Diagnosis present

## 2023-10-07 DIAGNOSIS — M1990 Unspecified osteoarthritis, unspecified site: Secondary | ICD-10-CM | POA: Diagnosis present

## 2023-10-07 DIAGNOSIS — D6959 Other secondary thrombocytopenia: Secondary | ICD-10-CM | POA: Diagnosis present

## 2023-10-07 DIAGNOSIS — E561 Deficiency of vitamin K: Secondary | ICD-10-CM | POA: Diagnosis present

## 2023-10-07 DIAGNOSIS — E785 Hyperlipidemia, unspecified: Secondary | ICD-10-CM | POA: Diagnosis present

## 2023-10-07 DIAGNOSIS — Z800 Family history of malignant neoplasm of digestive organs: Secondary | ICD-10-CM

## 2023-10-07 DIAGNOSIS — D7389 Other diseases of spleen: Secondary | ICD-10-CM

## 2023-10-07 DIAGNOSIS — E883 Tumor lysis syndrome: Principal | ICD-10-CM | POA: Diagnosis present

## 2023-10-07 DIAGNOSIS — C9110 Chronic lymphocytic leukemia of B-cell type not having achieved remission: Secondary | ICD-10-CM | POA: Diagnosis present

## 2023-10-07 DIAGNOSIS — Z1152 Encounter for screening for COVID-19: Secondary | ICD-10-CM | POA: Diagnosis not present

## 2023-10-07 DIAGNOSIS — F4323 Adjustment disorder with mixed anxiety and depressed mood: Secondary | ICD-10-CM | POA: Diagnosis present

## 2023-10-07 DIAGNOSIS — E8809 Other disorders of plasma-protein metabolism, not elsewhere classified: Secondary | ICD-10-CM | POA: Diagnosis not present

## 2023-10-07 DIAGNOSIS — E10649 Type 1 diabetes mellitus with hypoglycemia without coma: Secondary | ICD-10-CM | POA: Diagnosis not present

## 2023-10-07 DIAGNOSIS — E876 Hypokalemia: Secondary | ICD-10-CM | POA: Diagnosis not present

## 2023-10-07 DIAGNOSIS — E875 Hyperkalemia: Secondary | ICD-10-CM | POA: Diagnosis not present

## 2023-10-07 DIAGNOSIS — Z833 Family history of diabetes mellitus: Secondary | ICD-10-CM

## 2023-10-07 DIAGNOSIS — R55 Syncope and collapse: Secondary | ICD-10-CM | POA: Diagnosis present

## 2023-10-07 DIAGNOSIS — K5900 Constipation, unspecified: Secondary | ICD-10-CM | POA: Diagnosis present

## 2023-10-07 DIAGNOSIS — D735 Infarction of spleen: Secondary | ICD-10-CM | POA: Diagnosis present

## 2023-10-07 DIAGNOSIS — Z5112 Encounter for antineoplastic immunotherapy: Secondary | ICD-10-CM | POA: Diagnosis not present

## 2023-10-07 DIAGNOSIS — Z809 Family history of malignant neoplasm, unspecified: Secondary | ICD-10-CM

## 2023-10-07 LAB — BLOOD GAS, VENOUS
Acid-base deficit: 19.7 mmol/L — ABNORMAL HIGH (ref 0.0–2.0)
Bicarbonate: 5.6 mmol/L — ABNORMAL LOW (ref 20.0–28.0)
Patient temperature: 37
pCO2, Ven: <18 mmHg — CL (ref 44–60)
pH, Ven: 7.21 — ABNORMAL LOW (ref 7.25–7.43)
pO2, Ven: <31 mmHg — CL (ref 32–45)

## 2023-10-07 LAB — URINALYSIS, W/ REFLEX TO CULTURE (INFECTION SUSPECTED)
Bilirubin Urine: NEGATIVE
Glucose, UA: >=500 mg/dL — AB
Ketones, ur: NEGATIVE mg/dL
Leukocytes,Ua: NEGATIVE
Nitrite: NEGATIVE
Protein, ur: 30 mg/dL — AB
Specific Gravity, Urine: 1.026 (ref 1.005–1.030)
pH: 5 (ref 5.0–8.0)

## 2023-10-07 LAB — I-STAT CG4 LACTIC ACID, ED
Lactic Acid, Venous: 2.5 mmol/L (ref 0.5–1.9)
Lactic Acid, Venous: 5.4 mmol/L (ref 0.5–1.9)

## 2023-10-07 LAB — COMPREHENSIVE METABOLIC PANEL WITH GFR
ALT: 36 U/L (ref 0–44)
AST: 132 U/L — ABNORMAL HIGH (ref 15–41)
Albumin: 3.4 g/dL — ABNORMAL LOW (ref 3.5–5.0)
Alkaline Phosphatase: 160 U/L — ABNORMAL HIGH (ref 38–126)
Anion gap: 16 — ABNORMAL HIGH (ref 5–15)
BUN: 42 mg/dL — ABNORMAL HIGH (ref 8–23)
CO2: 21 mmol/L — ABNORMAL LOW (ref 22–32)
Calcium: 6.1 mg/dL — CL (ref 8.9–10.3)
Chloride: 97 mmol/L — ABNORMAL LOW (ref 98–111)
Creatinine, Ser: 2.48 mg/dL — ABNORMAL HIGH (ref 0.61–1.24)
GFR, Estimated: 28 mL/min — ABNORMAL LOW (ref >60)
Glucose, Bld: 45 mg/dL — ABNORMAL LOW (ref 70–99)
Potassium: 6.4 mmol/L (ref 3.5–5.1)
Sodium: 134 mmol/L — ABNORMAL LOW (ref 135–145)
Total Bilirubin: 1.3 mg/dL — ABNORMAL HIGH (ref 0.0–1.2)
Total Protein: 5.4 g/dL — ABNORMAL LOW (ref 6.5–8.1)

## 2023-10-07 LAB — MAGNESIUM: Magnesium: 2.4 mg/dL (ref 1.7–2.4)

## 2023-10-07 LAB — CBG MONITORING, ED
Glucose-Capillary: 192 mg/dL — ABNORMAL HIGH (ref 70–99)
Glucose-Capillary: 40 mg/dL — CL (ref 70–99)
Glucose-Capillary: 153 mg/dL — ABNORMAL HIGH (ref 70–99)

## 2023-10-07 LAB — PHOSPHORUS: Phosphorus: 8.3 mg/dL — ABNORMAL HIGH (ref 2.5–4.6)

## 2023-10-07 LAB — RESP PANEL BY RT-PCR (RSV, FLU A&B, COVID)  RVPGX2
Influenza A by PCR: NEGATIVE
Influenza B by PCR: NEGATIVE
Resp Syncytial Virus by PCR: NEGATIVE
SARS Coronavirus 2 by RT PCR: NEGATIVE

## 2023-10-07 LAB — PREPARE RBC (CROSSMATCH)

## 2023-10-07 LAB — GLUCOSE, CAPILLARY
Glucose-Capillary: 120 mg/dL — ABNORMAL HIGH (ref 70–99)
Glucose-Capillary: 48 mg/dL — ABNORMAL LOW (ref 70–99)
Glucose-Capillary: 49 mg/dL — ABNORMAL LOW (ref 70–99)

## 2023-10-07 LAB — PROTIME-INR
INR: 2.1 — ABNORMAL HIGH (ref 0.8–1.2)
Prothrombin Time: 24.6 s — ABNORMAL HIGH (ref 11.4–15.2)

## 2023-10-07 LAB — MRSA NEXT GEN BY PCR, NASAL: MRSA by PCR Next Gen: NOT DETECTED

## 2023-10-07 LAB — TROPONIN T, HIGH SENSITIVITY: Troponin T High Sensitivity: 46 ng/L — ABNORMAL HIGH (ref 0–19)

## 2023-10-07 MED ORDER — SODIUM CHLORIDE 0.9 % IV SOLN: 250.0000 mL | INTRAVENOUS | Status: AC

## 2023-10-07 MED ORDER — SODIUM CHLORIDE 0.9% IV SOLUTION: Freq: Once | INTRAVENOUS | Status: DC

## 2023-10-07 MED ORDER — PANTOPRAZOLE SODIUM 40 MG IV SOLR
40.0000 mg | INTRAVENOUS | Status: DC
  Administered 2023-10-07 – 2023-10-12 (×6): 40 mg via INTRAVENOUS
  Filled 2023-10-07 (×6): qty 10

## 2023-10-07 MED ORDER — SODIUM CHLORIDE 0.9% IV SOLUTION: Freq: Once | INTRAVENOUS | Status: AC

## 2023-10-07 MED ORDER — DEXTROSE 10 % IV BOLUS
250.0000 mL | Freq: Once | INTRAVENOUS | Status: AC
  Administered 2023-10-07: 250 mL via INTRAVENOUS

## 2023-10-07 MED ORDER — NOREPINEPHRINE 4 MG/250ML-% IV SOLN
0.0000 ug/min | INTRAVENOUS | Status: DC
  Administered 2023-10-07: 5 ug/min via INTRAVENOUS
  Filled 2023-10-07: qty 250

## 2023-10-07 MED ORDER — POLYETHYLENE GLYCOL 3350 17 G PO PACK
17.0000 g | PACK | Freq: Every day | ORAL | Status: DC | PRN
  Administered 2023-10-14: 17 g via ORAL
  Filled 2023-10-07: qty 1

## 2023-10-07 MED ORDER — DOCUSATE SODIUM 100 MG PO CAPS
100.0000 mg | ORAL_CAPSULE | Freq: Two times a day (BID) | ORAL | Status: DC | PRN
  Administered 2023-10-14: 100 mg via ORAL
  Filled 2023-10-07: qty 1

## 2023-10-07 MED ORDER — DEXTROSE 50 % IV SOLN
INTRAVENOUS | Status: AC
Start: 2023-10-07 — End: 2023-10-08
  Filled 2023-10-07: qty 50

## 2023-10-07 MED ORDER — LACTATED RINGERS IV SOLN: INTRAVENOUS | Status: DC

## 2023-10-07 MED ORDER — SODIUM CHLORIDE 0.9 % IV SOLN
100.0000 mg | Freq: Two times a day (BID) | INTRAVENOUS | Status: DC
  Administered 2023-10-07 – 2023-10-09 (×4): 100 mg via INTRAVENOUS
  Filled 2023-10-07 (×4): qty 100

## 2023-10-07 MED ORDER — ONDANSETRON HCL 4 MG/2ML IJ SOLN
4.0000 mg | Freq: Four times a day (QID) | INTRAMUSCULAR | Status: DC | PRN
  Administered 2023-10-16 – 2023-10-22 (×4): 4 mg via INTRAVENOUS
  Filled 2023-10-07 (×5): qty 2

## 2023-10-07 MED ORDER — CHLORHEXIDINE GLUCONATE CLOTH 2 % EX PADS
6.0000 | MEDICATED_PAD | Freq: Every day | CUTANEOUS | Status: DC
  Administered 2023-10-07 – 2023-10-23 (×14): 6 via TOPICAL

## 2023-10-07 MED ORDER — DEXTROSE 50 % IV SOLN
INTRAVENOUS | Status: AC
  Administered 2023-10-07: 50 mL via INTRAVENOUS
  Filled 2023-10-07: qty 50

## 2023-10-07 MED ORDER — ONDANSETRON HCL 4 MG/2ML IJ SOLN
4.0000 mg | Freq: Once | INTRAMUSCULAR | Status: AC
  Administered 2023-10-07: 4 mg via INTRAVENOUS
  Filled 2023-10-07: qty 2

## 2023-10-07 MED ORDER — NOREPINEPHRINE 4 MG/250ML-% IV SOLN
0.0000 ug/min | INTRAVENOUS | Status: DC
  Administered 2023-10-07: 3 ug/min via INTRAVENOUS
  Filled 2023-10-07: qty 250

## 2023-10-07 MED ORDER — DEXTROSE 50 % IV SOLN: 50.0000 mL | Freq: Once | INTRAVENOUS | Status: AC

## 2023-10-07 MED ORDER — POTASSIUM CHLORIDE 10 MEQ/100ML IV SOLN
10.0000 meq | INTRAVENOUS | Status: AC
  Administered 2023-10-07: 10 meq via INTRAVENOUS
  Filled 2023-10-07: qty 100

## 2023-10-07 MED ORDER — DEXTROSE 50 % IV SOLN
INTRAVENOUS | Status: AC
  Filled 2023-10-07: qty 50

## 2023-10-07 MED ORDER — LACTATED RINGERS IV BOLUS (SEPSIS)
1000.0000 mL | Freq: Once | INTRAVENOUS | Status: AC
  Administered 2023-10-07: 1000 mL via INTRAVENOUS

## 2023-10-07 MED ORDER — INSULIN ASPART 100 UNIT/ML IJ SOLN
0.0000 [IU] | INTRAMUSCULAR | Status: DC
  Administered 2023-10-09 – 2023-10-15 (×7): 1 [IU] via SUBCUTANEOUS
  Administered 2023-10-15: 2 [IU] via SUBCUTANEOUS
  Administered 2023-10-16 – 2023-10-18 (×4): 1 [IU] via SUBCUTANEOUS
  Administered 2023-10-18: 2 [IU] via SUBCUTANEOUS
  Administered 2023-10-18: 1 [IU] via SUBCUTANEOUS
  Administered 2023-10-18 – 2023-10-19 (×3): 2 [IU] via SUBCUTANEOUS
  Administered 2023-10-19: 1 [IU] via SUBCUTANEOUS
  Administered 2023-10-19 – 2023-10-20 (×3): 2 [IU] via SUBCUTANEOUS
  Filled 2023-10-07: qty 0.09

## 2023-10-07 MED ORDER — POTASSIUM CHLORIDE 10 MEQ/100ML IV SOLN
10.0000 meq | INTRAVENOUS | Status: AC
  Administered 2023-10-07: 10 meq via INTRAVENOUS
  Filled 2023-10-07 (×3): qty 100

## 2023-10-07 MED ORDER — IOHEXOL 300 MG/ML  SOLN
100.0000 mL | Freq: Once | INTRAMUSCULAR | Status: AC | PRN
  Administered 2023-10-07: 100 mL via INTRAVENOUS

## 2023-10-07 MED ORDER — SODIUM CHLORIDE 0.9 % IV SOLN
2.0000 g | Freq: Once | INTRAVENOUS | Status: AC
  Administered 2023-10-07: 2 g via INTRAVENOUS
  Filled 2023-10-07: qty 12.5

## 2023-10-07 MED ORDER — VANCOMYCIN HCL 1250 MG/250ML IV SOLN
1250.0000 mg | Freq: Once | INTRAVENOUS | Status: AC
  Administered 2023-10-07: 1250 mg via INTRAVENOUS
  Filled 2023-10-07: qty 250

## 2023-10-07 NOTE — Progress Notes (Signed)
 eLink Physician-Brief Progress Note Patient Name: Tyler Scott DOB: [birth year] MRN: [id number]   Date of Service  10/07/2023  HPI/Events of Note  Patient seen in the ED for hypotension and near syncope and admitted for profound anemia and thrombocytopenia in the context of complicated lymphoproliferative disease.  eICU Interventions  New Patient Evaluation.        Woodie Degraffenreid U Raegan Sipp 10/07/2023, 10:47 PM

## 2023-10-07 NOTE — ED Notes (Signed)
 Lab has 2 units of blood ready for this patient.  Informed Alyssa, RN and Alyssa,RN.

## 2023-10-07 NOTE — Progress Notes (Signed)
 ED Pharmacy Antibiotic Sign Off An antibiotic consult was received from an ED provider for cefepime  & vancomycin  per pharmacy dosing for sepsis. A chart review was completed to assess appropriateness.   The following one time order(s) were placed:  Cefepime  2 gm & vancomycin  1250 mg.  Further antibiotic and/or antibiotic pharmacy consults should be ordered by the admitting provider if indicated.   Thank you for allowing pharmacy to be a part of this patient's care.   Rosaline IVAR Edison, Pharm.D Use secure chat for questions 10/07/2023 4:48 PM

## 2023-10-07 NOTE — H&P (Signed)
 NAME:  Tyler Scott, MRN:  [id number], DOB:  [birth year], LOS: 0 ADMISSION DATE:  10/07/2023 CONSULTATION DATE:  10/07/2023 REFERRING MD:  Doretha - EDP, CHIEF COMPLAINT:  Hypotension   History of Present Illness:  68 year old man who presented to [facility] ED 8/28 with nausea, near-syncope, generalized weakness and SOB. PMHx significant for HTN, HLD, T1DM, chronic ITP (baseline Plt 40s), CLL with recent chemotherapy initiation ~6 weeks PTA.  Patient presented to [facility] ED from [facility] with multiple complaints; per family initially reported abdominal pain/nausea with dry heaves after his last infusion 8/27; he had persistent abdominal pain and poor PO intake. Patient was due for infusion today, 8/28 and when attempting to get to the car had an episode of near-syncope with SOB and generalized weakness. EMS was called and brought patient in where he was noted to be tachycardic, hypotensive and with mild AMS (improved shortly after arrival). Patient's wife notes hyperglycemia to 300s 8/28AM for which she administered insulin .  On ED arrival, patient was afebrile with HR 116, BP 95/67, RR 25, SpO2 94%. Labs were notable for WBC 14.6 (down from 17.1 8/27 with ANC 1100), Hgb 1.7 (8.2), Plt 10 (47). INR > 10 (1 two months PTA). CMP/lipase pending. Trop 46. LA 2.5 > 5.4. VBG 7.21/pCO2 < 18/pO2 < 31/bicarb 5.6. Initial CBG 40. CXR demonstrated small L pleural effusion and L lung base atelectasis with R paratracheal adenoapthyp; CT A/P showed extensive bulky LAD throughout the lower chest, abdomen, pelvis and groin regions (similar from prior), splenic enlargement with peripheral splenic infarct, small lucent lesions in the pelvis c/f lymphomatous involvement, moderate L and small R pleural effusions with atelectasis. Broad-spectrum antibiotics were started, Cx pending. Levophed  started for hypotension. Oncology consulted.  PCCM consulted for ICU admission.  Pertinent Medical History:   Past Medical  History:  Diagnosis Date   Allergy    Arthritis    CLL (chronic lymphocytic leukemia) (HCC)    stage III: asymptomatic; on observation   DIABETES MELLITUS, TYPE I, UNCONTROLLED 12/30/2006   insulin    Diabetic retinopathy    ERECTILE DYSFUNCTION, ORGANIC 12/30/2006   Hx of adenomatous polyp of colon 05/24/2014   HYPERLIPIDEMIA 12/30/2006   HYPERTENSION 12/30/2006   LOW BACK PAIN, ACUTE 12/21/2006   Significant Hospital Events: Including procedures, antibiotic start and stop dates in addition to other pertinent events   10/07/23 admitted to ICU   Interim History / Subjective:  As above  Objective:  Blood pressure (!) 106/56, pulse (!) 110, temperature (!) 97.3 F (36.3 C), temperature source Oral, resp. rate (!) 28, height 5' 8 (1.727 m), weight 56 kg, SpO2 100%.        Intake/Output Summary (Last 24 hours) at 10/07/2023 1820 Last data filed at 10/07/2023 1728 Gross per 24 hour  Intake 1350 ml  Output --  Net 1350 ml   Filed Weights   10/07/23 1525  Weight: 56 kg   Physical Examination: General: acutely ill appearing male, pale HEENT: [place]/AT, anicteric sclera, PERRL, dry mucous membranes Neuro: alert, moving all extremities, lethargic CV: tacycardic, no m/g/r. PULM: clear to auscultation, no wheezing GI: Soft, nontender, nondistended. Normoactive bowel sounds. Extremities: no LE edema noted. Skin: Warm/dry, no rashes.  Resolved Hospital Problem List:    Assessment & Plan:  Acute Hypoxemic Respiratory Failure Shock Lactic Acidosis Acute on Chronic Anemia Concern for hemolytic anemia vs sequestration CLL DMII Abnormal Electrolytes Abdominal Pain Splenomegaly Fall at Home  Plan - Admit to ICU - transfuse 3 units of PRBCS,  1 platelets, 2 units FFP - Repeat CBC post transfusions and transfuse accordingly - Hematology has been consulted - has received 1.2L of crystalloid fluid - start LR at 40mL/hr - repeat CMP and electrolytes - Continue levophed  for MAP  65 or greater - check head CT due to fall at home    Best Practice: (right click and Reselect all SmartList Selections daily)   Diet/type: NPO w/ oral meds DVT prophylaxis: SCD GI prophylaxis: PPI Lines: N/A Foley:  N/A Code Status:  full code Last date of multidisciplinary goals of care discussion [full code per family and patient]  Labs:  CBC: Recent Labs  Lab 10/01/23 1311 10/06/23 1218 10/07/23 1539  WBC 12.7* 17.1* 14.6*  NEUTROABS 2.0 2.3 1.1*  HGB 8.1* 8.2* 1.7*  HCT 24.8* 24.8* 7.0*  MCV 91.9 92.2 116.7*  PLT 47* 47* 10*   Basic Metabolic Panel: Recent Labs  Lab 10/01/23 1311 10/06/23 1218  NA 137 136  K 4.3 4.4  CL 103 100  CO2 26 27  GLUCOSE 154* 252*  BUN 20 15  CREATININE 1.06 1.07  CALCIUM  8.3* 8.1*   GFR: Estimated Creatinine Clearance: 53.1 mL/min (by C-G formula based on SCr of 1.07 mg/dL). Recent Labs  Lab 10/01/23 1311 10/06/23 1218 10/07/23 1539 10/07/23 1541 10/07/23 1751  WBC 12.7* 17.1* 14.6*  --   --   LATICACIDVEN  --   --   --  2.5* 5.4*   Liver Function Tests: Recent Labs  Lab 10/01/23 1311 10/06/23 1218  AST 42* 26  ALT 20 12  ALKPHOS 177* 166*  BILITOT 1.0 1.1  PROT 5.8* 5.7*  ALBUMIN 3.6 3.5   No results for input(s): LIPASE, AMYLASE in the last 168 hours. No results for input(s): AMMONIA in the last 168 hours.  ABG:    Component Value Date/Time   HCO3 5.6 (L) 10/07/2023 1539   ACIDBASEDEF 19.7 (H) 10/07/2023 1539   O2SAT NOT CALCULATED 10/07/2023 1539    Coagulation Profile: Recent Labs  Lab 10/07/23 1539  INR >10.0*   Cardiac Enzymes: No results for input(s): CKTOTAL, CKMB, CKMBINDEX, TROPONINI in the last 168 hours.  HbA1C: Hgb A1c MFr Bld  Date/Time Value Ref Range Status  08/07/2023 12:43 AM 5.8 (H) 4.8 - 5.6 % Final    Comment:    (NOTE) Diagnosis of Diabetes The following HbA1c ranges recommended by the American Diabetes Association (ADA) may be used as an aid in the  diagnosis of diabetes mellitus.  Hemoglobin             Suggested A1C NGSP%              Diagnosis  <5.7                   Non Diabetic  5.7-6.4                Pre-Diabetic  >6.4                   Diabetic  <7.0                   Glycemic control for                       adults with diabetes.    06/30/2016 03:09 PM 10.3 (H) 4.6 - 6.5 % Final    Comment:    Glycemic Control Guidelines for People with Diabetes:Non Diabetic:  <6%Goal of Therapy: <7%Additional Action Suggested:  >  8%    CBG: Recent Labs  Lab 10/07/23 1532 10/07/23 1602  GLUCAP 40* 192*   Review of Systems:   Review of Systems  Constitutional:  Positive for malaise/fatigue. Negative for chills, fever and weight loss.  HENT:  Negative for congestion, sinus pain and sore throat.   Eyes: Negative.   Respiratory:  Positive for shortness of breath. Negative for cough, hemoptysis, sputum production and wheezing.   Cardiovascular:  Negative for chest pain, palpitations, orthopnea, claudication and leg swelling.  Gastrointestinal:  Positive for abdominal pain. Negative for heartburn, nausea and vomiting.  Genitourinary: Negative.   Musculoskeletal:  Negative for joint pain and myalgias.  Skin:  Negative for rash.  Neurological:  Negative for weakness.  Endo/Heme/Allergies: Negative.   Psychiatric/Behavioral: Negative.      Past Medical History:  He,  has a past medical history of Allergy, Arthritis, CLL (chronic lymphocytic leukemia) (HCC), DIABETES MELLITUS, TYPE I, UNCONTROLLED (12/30/2006), Diabetic retinopathy, ERECTILE DYSFUNCTION, ORGANIC (12/30/2006), adenomatous polyp of colon (05/24/2014), HYPERLIPIDEMIA (12/30/2006), HYPERTENSION (12/30/2006), and LOW BACK PAIN, ACUTE (12/21/2006).   Surgical History:   Past Surgical History:  Procedure Laterality Date   COLONOSCOPY     great right toe- Fused the I P joint       Social History:   reports that he has never smoked. He has never used smokeless tobacco.  He reports current alcohol use of about 4.0 standard drinks of alcohol per week. He reports that he does not use drugs.   Family History:  His family history includes Alcohol abuse in an other family member; Colon cancer (age of onset: 6) in his brother; Diabetes Mellitus II in his mother. There is no history of Esophageal cancer, Rectal cancer, or Stomach cancer.   Allergies: No Known Allergies   Home Medications: Prior to Admission medications   Medication Sig Start Date End Date Taking? Authorizing Provider  acyclovir  (ZOVIRAX ) 400 MG tablet Take 1 tablet (400 mg total) by mouth daily. 08/16/23   Onesimo Emaline Brink, MD  allopurinol  (ZYLOPRIM ) 300 MG tablet Take 0.5 tablets (150 mg total) by mouth daily. 08/16/23   Onesimo Emaline Brink, MD  amLODipine  (NORVASC ) 10 MG tablet Take 1 tablet (10 mg total) by mouth daily for 10 days. 08/18/23 09/16/23  Vernon Ranks, MD  Blood Glucose Monitoring Suppl (ONE TOUCH ULTRA 2) W/DEVICE KIT See admin instructions. 07/25/14   [provider]  Cholecalciferol 50 MCG (2000 UT) TABS TAKE ONE TABLET BY MOUTH DAILY FOR LOW VITAMIN D . 04/29/20   [provider]  dexamethasone  (DECADRON ) 4 MG tablet Take 8 mg by mouth daily. For 2 days after chemo    [provider]  doxepin  (SINEQUAN ) 25 MG capsule Take 1 capsule by mouth at bedtime. 11/29/20   [provider]  empagliflozin  (JARDIANCE ) 25 MG TABS tablet Take 0.5 tablets by mouth every morning. Only if blood sugars get too high 05/30/21   [provider]  finasteride  (PROSCAR ) 5 MG tablet Take 0.5 tablets by mouth daily. 05/01/20   [provider]  gabapentin  (NEURONTIN ) 100 MG capsule Take 1 capsule (100 mg total) by mouth 3 (three) times daily. 09/02/23   Shalhoub, Zachary PARAS, MD  HYDROmorphone  (DILAUDID ) 2 MG tablet Take 1 tablet (2 mg total) by mouth every 6 (six) hours as needed for severe pain (pain score 7-10). 10/06/23   Pickenpack-Cousar, Fannie SAILOR, NP  Insulin   Glargine (LANTUS  SOLOSTAR) 100 UNIT/ML Solostar Pen INJECT 20 UNITS SUBCUTANEOUSLY AT BEDTIME 06/30/16   Krystal Purchase  A, MD  methocarbamol  (ROBAXIN ) 500 MG tablet Take 1 tablet (500 mg total) by mouth every 8 (eight) hours as needed for muscle spasms. 10/06/23   Pickenpack-Cousar, Athena N, NP  morphine  (MS CONTIN ) 15 MG 12 hr tablet Take 1 tablet (15 mg total) by mouth every 12 (twelve) hours. 10/06/23   Pickenpack-Cousar, Athena N, NP  ondansetron  (ZOFRAN ) 8 MG tablet Take 1 tablet (8 mg total) by mouth every 8 (eight) hours as needed for nausea or vomiting. Start on the third day after chemotherapy. 08/16/23   Onesimo Emaline Brink, MD  ONE [facility] ULTRA TEST test strip TEST GLUCOSE 3 TIMES A DAY 10/07/16   Krystal Reyes LABOR, MD  [facility] DELICA LANCETS 33G MISC TEST GLUCOSE 3 TIMES A DAY 10/07/16   Krystal Reyes LABOR, MD  polyethylene glycol powder (GLYCOLAX /MIRALAX ) 17 GM/SCOOP powder Take 17 g dissolved in liquid by mouth daily. 09/03/23   Shalhoub, Zachary PARAS, MD  prochlorperazine  (COMPAZINE ) 10 MG tablet Take 1 tablet (10 mg total) by mouth every 6 (six) hours as needed for nausea or vomiting. 08/16/23   Kale, Gautam Kishore, MD  senna (SENOKOT) 8.6 MG TABS tablet Take 1 tablet (8.6 mg total) by mouth 2 (two) times daily. 09/02/23   Shalhoub, Zachary PARAS, MD  simvastatin  (ZOCOR ) 20 MG tablet TAKE 1 TABLET (20 MG TOTAL) BY MOUTH AT BEDTIME. 06/30/16   Krystal Reyes LABOR, MD  tenofovir alafenamide (VEMLIDY ) 25 MG tablet Take 1 tablet (25 mg total) by mouth daily. 09/30/23   Waddell Alan PARAS, RPH-CPP   Critical care time:   The patient is critically ill with multiple organ system failure and requires high complexity decision making for assessment and support, frequent evaluation and titration of therapies, advanced monitoring, review of radiographic studies and interpretation of complex data.   Critical Care Time devoted to patient care services, exclusive of separately billable procedures, described in this note is 45  minutes.  Dorn Chill, MD [facility] Office: [phone]   See Amion for personal pager PCCM on call pager [phone] until 7pm. Please call Elink 7p-7a. [phone]

## 2023-10-07 NOTE — ED Notes (Signed)
 Provider made aware of Istat results for lactic and Chem-8. Chem 8 results would not result for several values. Pt's blood in tube noted to be very bright red and diluted. Per MD Plunkett now waiting on CMP results.

## 2023-10-07 NOTE — ED Notes (Signed)
 Ok to hold off on Norepi at this time per MD [employer]. To initiate if MAP goes below 65.

## 2023-10-07 NOTE — Progress Notes (Signed)
 Symptom Management Consult Note [facility]    Patient Care Team: [name], MD as PCP - General (Family Medicine) [name], MD (Inactive) Pickenpack-Cousar, Fannie SAILOR, NP as Nurse Practitioner [facility] and Palliative Medicine)    Name / MRN / DOB: Tyler Scott  [id number]  31-Aug-1955   Date of visit: 10/07/2023   Chief Complaint/Reason for visit: hypotension   Current Therapy: Bendamustine  and Rituxamab-pvvr with weekly Nplate   Last treatment:  Day 1   Cycle 2 on 10/06/23    ASSESSMENT AND PLAN Patient is a 68 y.o. male with oncologic history of CLL followed by Dr. Onesimo.  I have viewed most recent oncology note and lab work.  #CLL - Chart review shows recently started this treatment on 09/08/23. Had progression seen on CT chest on 08/07/23 - Next appointment with oncologist is 11/03/23   #Hypotension and hypoxia - Patient in acute distress, unstable vitals on arrival. Patient placed on 4L [place]. Attmepted IV access x1 without success. - Per spouse patient began feeling unwell this morning with abdominal pain and SOB. He had a fall (assisted to ground, landing on knees w/o LOC) and refused EMS transport. Patient arrived to [facility] via private vehicle today. - Report given to accepting ED RN and MD. Oncologist updated.      HEME/ONC HISTORY Oncology History  CLL (chronic lymphocytic leukemia) (HCC)  12/20/2010 Initial Diagnosis   CLL (chronic lymphocytic leukemia) (HCC)   04/05/2023 - 06/08/2023 Chemotherapy   Patient is on Treatment Plan : LYMPHOMA CLL/SLL Venetoclax  + Obinutuzumab  q28d     06/14/2023 Cancer Staging   Staging form: Chronic Lymphocytic Leukemia / Small Lymphocytic Lymphoma, AJCC 8th Edition - Clinical: Modified Rai Stage IV (Modified Rai risk: High, Lymphocytosis: Present, Adenopathy: Present, Organomegaly: Present, Anemia: Present, Thrombocytopenia: Present) - Signed by Onesimo Emaline Brink, MD on  06/14/2023 Stage prefix: Recurrence   09/08/2023 -  Chemotherapy   Patient is on Treatment Plan : NON-HODGKINS LYMPHOMA Rituximab  D1 + Bendamustine  D1,2 q28d x 6 cycles         INTERVAL HISTORY  Discussed the use of AI scribe software for clinical note transcription with the patient, who gave verbal consent to proceed.    Tyler Scott is a 68 y.o. male with oncologic history as above presenting to [facility] today with chief complaint of hypotension and hypoxia. Patient is accompanied by family member who provides history. Patient unable to provide history 2/2 acuity of condition.   Per daughter patient woke up early this morning complaining of abdominal pain.  He requested an Alka-Seltzer which she gave him.  Shortly after that patient admitted to feeling short of breath and wanted to go outside to get fresh air.  He states the fresh air made him feel better however daughter noticed that he had increased work of breathing.  Patient had treatment yesterday.  Spouse and daughter noticed he was very weak after treatment.  At times he was not making sense.  His blood sugar this morning was in the 300s so spouse gave him insulin .  While attempting to ambulate to the car for his scheduled infusion appointment patient admitted to feeling weak and wife helped lower him to the ground.  He landed on his knees and then was assisted to his bottom.  She had to call the fire department because she was unable to get him up.  Patient refused EMS transport as he wanted to make it to the cancer for his treatment  today.   ROS  All other systems are reviewed and are negative for acute change except as noted in the HPI.    No Known Allergies   Past Medical History:  Diagnosis Date   Allergy    Arthritis    CLL (chronic lymphocytic leukemia) (HCC)    stage III: asymptomatic; on observation   DIABETES MELLITUS, TYPE I, UNCONTROLLED 12/30/2006   insulin    Diabetic retinopathy    ERECTILE DYSFUNCTION,  ORGANIC 12/30/2006   Hx of adenomatous polyp of colon 05/24/2014   HYPERLIPIDEMIA 12/30/2006   HYPERTENSION 12/30/2006   LOW BACK PAIN, ACUTE 12/21/2006     Past Surgical History:  Procedure Laterality Date   COLONOSCOPY     great right toe- Fused the I P joint      Social History   Socioeconomic History   Marital status: Married    Spouse name: Not on file   Number of children: Not on file   Years of education: Not on file   Highest education level: Not on file  Occupational History   Not on file  Tobacco Use   Smoking status: Never   Smokeless tobacco: Never  Vaping Use   Vaping status: Never Used  Substance and Sexual Activity   Alcohol use: Yes    Alcohol/week: 4.0 standard drinks of alcohol    Types: 4 Glasses of wine per week   Drug use: No   Sexual activity: Not on file    Comment: regular exercise - yes  Other Topics Concern   Not on file  Social History Narrative   Not on file   Social Drivers of Health   Financial Resource Strain: Not on file  Food Insecurity: No Food Insecurity (08/27/2023)   Hunger Vital Sign    Worried About Running Out of Food in the Last Year: Never true    Ran Out of Food in the Last Year: Never true  Transportation Needs: No Transportation Needs (08/27/2023)   PRAPARE - Administrator, Civil Service (Medical): No    Lack of Transportation (Non-Medical): No  Physical Activity: Not on file  Stress: Not on file  Social Connections: Moderately Isolated (08/27/2023)   Social Connection and Isolation Panel    Frequency of Communication with Friends and Family: More than three times a week    Frequency of Social Gatherings with Friends and Family: Once a week    Attends Religious Services: Never    Database administrator or Organizations: No    Attends Banker Meetings: Never    Marital Status: Married  Catering manager Violence: Not At Risk (08/27/2023)   Humiliation, Afraid, Rape, and Kick questionnaire     Fear of Current or Ex-Partner: No    Emotionally Abused: No    Physically Abused: No    Sexually Abused: No    Family History  Problem Relation Age of Onset   Alcohol abuse Other    Colon cancer Brother 67       Died 09-19-13   Diabetes Mellitus II Mother    Esophageal cancer Neg Hx    Rectal cancer Neg Hx    Stomach cancer Neg Hx     No current facility-administered medications for this visit.  Current Outpatient Medications:    acyclovir  (ZOVIRAX ) 400 MG tablet, Take 1 tablet (400 mg total) by mouth daily., Disp: 30 tablet, Rfl: 3   allopurinol  (ZYLOPRIM ) 300 MG tablet, Take 0.5 tablets (150 mg total) by mouth daily.,  Disp: 30 tablet, Rfl: 3   amLODipine  (NORVASC ) 10 MG tablet, Take 1 tablet (10 mg total) by mouth daily for 10 days., Disp: 30 tablet, Rfl: 0   Blood Glucose Monitoring Suppl (ONE TOUCH ULTRA 2) W/DEVICE KIT, See admin instructions., Disp: , Rfl: 0   Cholecalciferol 50 MCG (2000 UT) TABS, TAKE ONE TABLET BY MOUTH DAILY FOR LOW VITAMIN D ., Disp: , Rfl:    dexamethasone  (DECADRON ) 4 MG tablet, Take 8 mg by mouth daily. For 2 days after chemo, Disp: , Rfl:    doxepin  (SINEQUAN ) 25 MG capsule, Take 1 capsule by mouth at bedtime., Disp: , Rfl:    empagliflozin  (JARDIANCE ) 25 MG TABS tablet, Take 0.5 tablets by mouth every morning. Only if blood sugars get too high, Disp: , Rfl:    finasteride  (PROSCAR ) 5 MG tablet, Take 0.5 tablets by mouth daily., Disp: , Rfl:    gabapentin  (NEURONTIN ) 100 MG capsule, Take 1 capsule (100 mg total) by mouth 3 (three) times daily., Disp: 90 capsule, Rfl: 1   HYDROmorphone  (DILAUDID ) 2 MG tablet, Take 1 tablet (2 mg total) by mouth every 6 (six) hours as needed for severe pain (pain score 7-10)., Disp: 45 tablet, Rfl: 0   Insulin  Glargine (LANTUS  SOLOSTAR) 100 UNIT/ML Solostar Pen, INJECT 20 UNITS SUBCUTANEOUSLY AT BEDTIME, Disp: 3 pen, Rfl: 11   methocarbamol  (ROBAXIN ) 500 MG tablet, Take 1 tablet (500 mg total) by mouth every 8 (eight) hours  as needed for muscle spasms., Disp: 60 tablet, Rfl: 1   morphine  (MS CONTIN ) 15 MG 12 hr tablet, Take 1 tablet (15 mg total) by mouth every 12 (twelve) hours., Disp: 60 tablet, Rfl: 0   ondansetron  (ZOFRAN ) 8 MG tablet, Take 1 tablet (8 mg total) by mouth every 8 (eight) hours as needed for nausea or vomiting. Start on the third day after chemotherapy., Disp: 30 tablet, Rfl: 1   ONE TOUCH ULTRA TEST test strip, TEST GLUCOSE 3 TIMES A DAY, Disp: 300 each, Rfl: 1   ONETOUCH DELICA LANCETS 33G MISC, TEST GLUCOSE 3 TIMES A DAY, Disp: 300 each, Rfl: 1   polyethylene glycol powder (GLYCOLAX /MIRALAX ) 17 GM/SCOOP powder, Take 17 g dissolved in liquid by mouth daily., Disp: 476 g, Rfl: 2   prochlorperazine  (COMPAZINE ) 10 MG tablet, Take 1 tablet (10 mg total) by mouth every 6 (six) hours as needed for nausea or vomiting., Disp: 30 tablet, Rfl: 1   senna (SENOKOT) 8.6 MG TABS tablet, Take 1 tablet (8.6 mg total) by mouth 2 (two) times daily., Disp: 60 tablet, Rfl: 2   simvastatin  (ZOCOR ) 20 MG tablet, TAKE 1 TABLET (20 MG TOTAL) BY MOUTH AT BEDTIME., Disp: 100 tablet, Rfl: 4   tenofovir alafenamide (VEMLIDY ) 25 MG tablet, Take 1 tablet (25 mg total) by mouth daily., Disp: 30 tablet, Rfl: 5  Facility-Administered Medications Ordered in Other Visits:    dextrose  (D10W) 10% bolus 250 mL, 250 mL, Intravenous, Once, Plunkett, Whitney, MD, Last Rate: 483.9 mL/hr at 10/07/23 1537, 250 mL at 10/07/23 1537   dextrose  50 % solution, , , ,    lactated ringers  bolus 1,000 mL, 1,000 mL, Intravenous, Once, Plunkett, Whitney, MD   ondansetron  (ZOFRAN ) injection 4 mg, 4 mg, Intravenous, Once, Plunkett, Whitney, MD  PHYSICAL EXAM ECOG FS:3 - Symptomatic, >50% confined to bed    Vitals:   10/07/23 1506 10/07/23 1511  BP: (!) 79/42 (!) 83/49  Pulse: (!) 117 (!) 115  Resp: (!) 24 (!) 24  Temp: 97.7 F (36.5  C)   TempSrc: Oral   SpO2: (!) 79% 92%   Physical Exam Vitals and nursing note reviewed.  Constitutional:       General: He is in acute distress.     Appearance: He is toxic-appearing. He is not ill-appearing.     Comments: Frail-appearing male  HENT:     Head: Normocephalic.     Mouth/Throat:     Mouth: Mucous membranes are dry.  Eyes:     Conjunctiva/sclera: Conjunctivae normal.  Cardiovascular:     Rate and Rhythm: Regular rhythm. Tachycardia present.     Pulses: Normal pulses.     Heart sounds: Normal heart sounds.  Pulmonary:     Effort: Respiratory distress present.  Abdominal:     General: There is no distension.  Musculoskeletal:     Cervical back: Normal range of motion.  Skin:    General: Skin is warm and dry.     Coloration: Skin is pale.  Neurological:     Mental Status: He is oriented to person, place, and time.        LABORATORY DATA I have reviewed the data as listed    Latest Ref Rng & Units 10/06/2023   12:18 PM 10/01/2023    1:11 PM 09/22/2023    9:27 AM  CBC  WBC 4.0 - 10.5 K/uL 17.1  12.7  10.3   Hemoglobin 13.0 - 17.0 g/dL 8.2  8.1  9.1   Hematocrit 39.0 - 52.0 % 24.8  24.8  27.7   Platelets 150 - 400 K/uL 47  47  60         Latest Ref Rng & Units 10/06/2023   12:18 PM 10/01/2023    1:11 PM 09/22/2023    9:27 AM  CMP  Glucose 70 - 99 mg/dL 747  845  893   BUN 8 - 23 mg/dL 15  20  17    Creatinine 0.61 - 1.24 mg/dL 8.92  8.93  8.80   Sodium 135 - 145 mmol/L 136  137  139   Potassium 3.5 - 5.1 mmol/L 4.4  4.3  3.9   Chloride 98 - 111 mmol/L 100  103  103   CO2 22 - 32 mmol/L 27  26  29    Calcium  8.9 - 10.3 mg/dL 8.1  8.3  8.5   Total Protein 6.5 - 8.1 g/dL 5.7  5.8  6.4   Total Bilirubin 0.0 - 1.2 mg/dL 1.1  1.0  1.2   Alkaline Phos 38 - 126 U/L 166  177  139   AST 15 - 41 U/L 26  42  128   ALT 0 - 44 U/L 12  20  82        RADIOGRAPHIC STUDIES (from last 24 hours if applicable) I have personally reviewed the radiological images as listed and agreed with the findings in the report. No results found.      Visit Diagnosis: 1. CLL  (chronic lymphocytic leukemia) (HCC)   2. Hypotension, unspecified hypotension type   3. Hypoxia      No orders of the defined types were placed in this encounter.   All questions were answered. The patient knows to call the clinic with any problems, questions or concerns. No barriers to learning was detected.  A total of more than 40 minutes were spent on this encounter with face-to-face time and non-face-to-face time, including preparing to see the patient, ordering tests and/or medications, counseling the patient and coordination of care as outlined  above.    Thank you for allowing me to participate in the care of this patient.    Axzel Rockhill E  Walisiewicz, PA-C Department of Hematology/Oncology [facility] at [facility] Phone: [phone]  [phone]    10/07/2023 3:41 PM

## 2023-10-07 NOTE — ED Triage Notes (Signed)
 Patient sent over from cancer center. History of CLL. Was going into cancer center for treatment today. Was hypotensive at 79/42, tachycardic at 117, 79% room air. Staff placed patient on 4L [place]. Last treatment was yesterday. Daughter and wife said patient was weak and short of breath since this morning. Wife helped patient to car, felt like falling and lowered patient to the ground.

## 2023-10-07 NOTE — ED Notes (Signed)
Report given to Tresa Endo, RN ICU.

## 2023-10-07 NOTE — ED Provider Notes (Signed)
 [place] EMERGENCY DEPARTMENT AT [facility] Provider Note   CSN: [id number] Arrival date & time: 10/07/23  1518     Patient presents with: Hypotension   Tyler Scott is a 68 y.o. male.   Patient is a 68 year old male with a history of chronic ITP with low platelet count in the 40s, diabetes, CLL who started chemotherapy approximately 6 weeks ago, hypertension, hyperlipidemia who is presenting today from the cancer center due to multiple complaints.  Patient received his last transfusion yesterday.  His wife and daughter report that after he got home yesterday evening he was complaining of abdominal pain and then had some dry heaving overnight.  This morning he was still complaining of abdominal pain that was generalized but did not have any emesis.  He has not had anything to eat since yesterday.  When they were getting him ready for his appointment as he was supposed to get another infusion today his wife tried to walk him out to the car when he had a near syncopal event.  Patient was complaining of shortness of breath, generalized weakness.  They did call 911 who helped to get him in the car and they took him to the cancer center.  At the cancer center patient was found to be hypotensive, tachycardic and mildly confused.  In the room he is able to give a full report.  He states that right now he is not having abdominal pain and denies feeling nauseated.  He reports the shortness of breath has improved and he denies any chest pain, headache.  Family denies any fevers.  He was taken off all of his blood pressure medications approximately 1 week ago.  His wife also reported that his blood sugar was in the 300s this morning and she gave him a shot of insulin .  The history is provided by the patient.       Prior to Admission medications   Medication Sig Start Date End Date Taking? Authorizing Provider  acyclovir  (ZOVIRAX ) 400 MG tablet Take 1 tablet (400 mg total) by mouth  daily. 08/16/23   Onesimo Emaline Brink, MD  allopurinol  (ZYLOPRIM ) 300 MG tablet Take 0.5 tablets (150 mg total) by mouth daily. 08/16/23   Onesimo Emaline Brink, MD  amLODipine  (NORVASC ) 10 MG tablet Take 1 tablet (10 mg total) by mouth daily for 10 days. 08/18/23 09/16/23  Vernon Ranks, MD  Blood Glucose Monitoring Suppl (ONE TOUCH ULTRA 2) W/DEVICE KIT See admin instructions. 07/25/14   [provider]  Cholecalciferol 50 MCG (2000 UT) TABS TAKE ONE TABLET BY MOUTH DAILY FOR LOW VITAMIN D . 04/29/20   [provider]  dexamethasone  (DECADRON ) 4 MG tablet Take 8 mg by mouth daily. For 2 days after chemo    [provider]  doxepin  (SINEQUAN ) 25 MG capsule Take 1 capsule by mouth at bedtime. 11/29/20   [provider]  empagliflozin  (JARDIANCE ) 25 MG TABS tablet Take 0.5 tablets by mouth every morning. Only if blood sugars get too high 05/30/21   [provider]  finasteride  (PROSCAR ) 5 MG tablet Take 0.5 tablets by mouth daily. 05/01/20   [provider]  gabapentin  (NEURONTIN ) 100 MG capsule Take 1 capsule (100 mg total) by mouth 3 (three) times daily. 09/02/23   Shalhoub, Zachary PARAS, MD  HYDROmorphone  (DILAUDID ) 2 MG tablet Take 1 tablet (2 mg total) by mouth every 6 (six) hours as needed for severe pain (pain score 7-10). 10/06/23   Pickenpack-Cousar, Fannie SAILOR, NP  Insulin  Glargine (LANTUS  SOLOSTAR) 100 UNIT/ML Solostar Pen INJECT 20 UNITS SUBCUTANEOUSLY AT BEDTIME 06/30/16   Krystal Reyes LABOR, MD  methocarbamol  (ROBAXIN ) 500 MG tablet Take 1 tablet (500 mg total) by mouth every 8 (eight) hours as needed for muscle spasms. 10/06/23   Pickenpack-Cousar, Athena N, NP  morphine  (MS CONTIN ) 15 MG 12 hr tablet Take 1 tablet (15 mg total) by mouth every 12 (twelve) hours. 10/06/23   Pickenpack-Cousar, Athena N, NP  ondansetron  (ZOFRAN ) 8 MG tablet Take 1 tablet (8 mg total) by mouth every 8 (eight) hours as needed for nausea or vomiting. Start on the third day after  chemotherapy. 08/16/23   Onesimo Emaline Brink, MD  ONE [facility] ULTRA TEST test strip TEST GLUCOSE 3 TIMES A DAY 10/07/16   Krystal Reyes LABOR, MD  [facility] DELICA LANCETS 33G MISC TEST GLUCOSE 3 TIMES A DAY 10/07/16   Krystal Reyes LABOR, MD  polyethylene glycol powder (GLYCOLAX /MIRALAX ) 17 GM/SCOOP powder Take 17 g dissolved in liquid by mouth daily. 09/03/23   Shalhoub, Zachary PARAS, MD  prochlorperazine  (COMPAZINE ) 10 MG tablet Take 1 tablet (10 mg total) by mouth every 6 (six) hours as needed for nausea or vomiting. 08/16/23   Kale, Gautam Kishore, MD  senna (SENOKOT) 8.6 MG TABS tablet Take 1 tablet (8.6 mg total) by mouth 2 (two) times daily. 09/02/23   Shalhoub, Zachary PARAS, MD  simvastatin  (ZOCOR ) 20 MG tablet TAKE 1 TABLET (20 MG TOTAL) BY MOUTH AT BEDTIME. 06/30/16   Krystal Reyes LABOR, MD  tenofovir alafenamide (VEMLIDY ) 25 MG tablet Take 1 tablet (25 mg total) by mouth daily. 09/30/23   Waddell Alan PARAS, RPH-CPP    Allergies: Patient has no known allergies.    Review of Systems  Updated Vital Signs BP 95/67   Pulse (!) 116   Temp 97.7 F (36.5 C) (Oral)   Resp (!) 25   Ht 5' 8 (1.727 m)   Wt 56 kg   SpO2 94%   BMI 18.77 kg/m   Physical Exam Vitals and nursing note reviewed.  Constitutional:      General: He is not in acute distress.    Appearance: He is well-developed. He is ill-appearing and toxic-appearing.  HENT:     Head: Normocephalic and atraumatic.     Mouth/Throat:     Mouth: Mucous membranes are dry.  Eyes:     Extraocular Movements: Extraocular movements intact.     Pupils: Pupils are equal, round, and reactive to light.     Comments: Pale conjunctive.  Pupils are 2 mm and sluggishly reactive  Cardiovascular:     Rate and Rhythm: Regular rhythm. Tachycardia present.     Pulses: Normal pulses.     Heart sounds: No murmur heard.    Comments: Pulse present and thready in the radials bilaterally Pulmonary:     Effort: Pulmonary effort is normal. Tachypnea present. No respiratory  distress.     Breath sounds: Normal breath sounds. No wheezing or rales.  Abdominal:     General: There is no distension.     Palpations: Abdomen is soft. There is hepatomegaly and splenomegaly.     Tenderness: There is no abdominal tenderness. There is no guarding or rebound.  Musculoskeletal:        General: No tenderness. Normal range of motion.     Cervical back: Normal range of motion and neck supple.     Right lower leg: No edema.     Left lower leg: No edema.  Skin:    General: Skin is warm and dry.     Coloration: Skin is pale.     Findings: No erythema or rash.  Neurological:     Mental Status: He is alert and oriented to person, place, and time.     Sensory: No sensory deficit.     Motor: No weakness.     Comments: Patient is mentating normally.  He was not ambulated due to his condition but he is able to lift his arms and legs spontaneously  Psychiatric:        Behavior: Behavior normal.     (all labs ordered are listed, but only abnormal results are displayed) Labs Reviewed  BLOOD GAS, VENOUS - Abnormal; Notable for the following components:      Result Value   pH, Ven 7.21 (*)    pCO2, Ven <18 (*)    pO2, Ven <31 (*)    Bicarbonate 5.6 (*)    Acid-base deficit 19.7 (*)    All other components within normal limits  I-STAT CG4 LACTIC ACID, ED - Abnormal; Notable for the following components:   Lactic Acid, Venous 2.5 (*)    All other components within normal limits  CBG MONITORING, ED - Abnormal; Notable for the following components:   Glucose-Capillary 40 (*)    All other components within normal limits  CBG MONITORING, ED - Abnormal; Notable for the following components:   Glucose-Capillary 192 (*)    All other components within normal limits  CULTURE, BLOOD (ROUTINE X 2)  CULTURE, BLOOD (ROUTINE X 2)  COMPREHENSIVE METABOLIC PANEL WITH GFR  CBC WITH DIFFERENTIAL/PLATELET  PROTIME-INR  LIPASE, BLOOD  URINALYSIS, W/ REFLEX TO CULTURE (INFECTION SUSPECTED)   LACTATE DEHYDROGENASE  I-STAT CHEM 8, ED  TYPE AND SCREEN  PREPARE RBC (CROSSMATCH)  TROPONIN T, HIGH SENSITIVITY    EKG: None  Radiology: [facility] Chest Port 1 View Result Date: 10/07/2023 CLINICAL DATA:  Questionable sepsis. EXAM: PORTABLE CHEST 1 VIEW COMPARISON:  Chest CT dated 08/07/2023. FINDINGS: Small left pleural effusion and left lung base atelectasis or infiltrate. No pneumothorax. The cardiac silhouette is within limits. Right paratracheal density consistent with adenopathy. No acute osseous pathology. IMPRESSION: 1. Small left pleural effusion and left lung base atelectasis or infiltrate. 2. Right paratracheal adenopathy. Electronically Signed   By: Vanetta Chou M.D.   On: 10/07/2023 15:59     Procedures   Medications Ordered in the ED  lactated ringers  bolus 1,000 mL (1,000 mLs Intravenous New Bag/Given 10/07/23 1554)  dextrose  50 % solution (0 g  Hold 10/07/23 1540)  norepinephrine  (LEVOPHED ) 4mg  in (0.016 mg/mL) premix infusion (has no administration in time range)  0.9 %  sodium chloride  infusion (Manually program via Guardrails IV Fluids) (has no administration in time range)  ondansetron  (ZOFRAN ) injection 4 mg (4 mg Intravenous Given 10/07/23 1543)  dextrose  (D10W) 10% bolus 250 mL (250 mLs Intravenous New Bag/Given 10/07/23 1537)                                    Medical Decision Making Amount and/or Complexity of Data Reviewed Independent Historian:     Details: Cancer center External Data Reviewed: notes. Labs: ordered. Decision-making details documented in ED Course. Radiology: ordered and independent interpretation performed. Decision-making details documented in ED Course. ECG/medicine tests: ordered and independent interpretation performed. Decision-making details documented in ED Course.  Risk Prescription drug management. Decision regarding  hospitalization.   Pt with multiple medical problems and comorbidities and presenting today with a  complaint that caries a high risk for morbidity and mortality.  Here today with the above complaints.  Concern for sepsis, severe symptomatic anemia, thrombocytopenia, new acute abdominal process such as obstruction, gastritis, hepatitis, pancreatitis, perforation, ischemia, concern for AKI, electrolyte abnormalities, pneumonia, neutropenia, hypoglycemia.  Patient's symptoms are not classic for stroke. Patient received a 1 L bolus and 250 mL of D10 with improvement of blood pressure to 95/67 with persistent tachycardia in the 1 teens which seems to be his baseline based on recent clinic notes.  Patient is on oxygen but O2 sat is 94%. I independently interpreted patient's EKG and labs.  Lactic acid elevated at 2.5, initial blood sugar is 40, after the D10 recurrent blood sugar is 192, Chem-8 showed a sodium of 154 and an unknown detectable hemoglobin and hematocrit as well as a nondetectable potassium.LDH and type and screen was sent.  VBG showed a metabolic acidosis with respiratory compensation with a pH of 7.21, bicarb of 5.6 and a CO2 of less than 18.  CBC with a hemoglobin of 1.7, platelet count of 10 and white count of 14.  INR is greater than 10.  EKG with sinus tachycardia but no other acute changes.  I have independently visualized and interpreted pt's images today.  X-ray today shows signs of a left-sided pleural effusion but no obvious pneumonia.  Patient was covered with cefepime  and vancomycin  given possible sepsis in light of being critically ill.  Blood transfusion was ordered.  Spoke with Dr.Gorsuch with oncology who recommended transfusing 3 units of blood and then 1 pheresis of platelets and then 2 units of FFP and then he will need repeat CBC.  Patient's blood pressure initially had improved but maps are low again and he was started on norepinephrine .  CT done due to patient's abdominal pain but concern for sequestration and hemolysis of blood by the spleen. Findings discussed with the patient  and his wife.  Critical care consulted for admission.  Discussed with the patient and his wife and currently he is full code.  CT showed splenic enlargement and bulky lymphadenopathy but no other acute findings such as perforation or bowel obstruction.  CRITICAL CARE Performed by: Kirbi Farrugia Total critical care time: 90 minutes Critical care time was exclusive of separately billable procedures and treating other patients. Critical care was necessary to treat or prevent imminent or life-threatening deterioration. Critical care was time spent personally by me on the following activities: development of treatment plan with patient and/or surrogate as well as nursing, discussions with consultants, evaluation of patient's response to treatment, examination of patient, obtaining history from patient or surrogate, ordering and performing treatments and interventions, ordering and review of laboratory studies, ordering and review of radiographic studies, pulse oximetry and re-evaluation of patient's condition.      Final diagnoses:  None    ED Discharge Orders     None          Doretha Folks, MD 10/07/23 [phone]

## 2023-10-07 NOTE — ED Notes (Addendum)
 Patient noted to have dried blood on his lower gum area. Family stated patient had a fall on way to appointment and do not think patient hit his head - but are overall unsure. Admitting MD Dewald notified via secure chat.

## 2023-10-07 NOTE — Progress Notes (Signed)
 Upon arrival to infusion appoint, patient's family informed RN that patient fell prior to coming to [facility].  On assessment, patient somnolent and not responsive to voice.  BP 79/42, pulse 118, Sp02 in the 70s on RA.  Mallie Combes, PA, at chairside.  Patient placed on supplemental oxygen and transferred to ED with PA, family at bedside.

## 2023-10-08 DIAGNOSIS — E561 Deficiency of vitamin K: Secondary | ICD-10-CM | POA: Diagnosis not present

## 2023-10-08 DIAGNOSIS — E11649 Type 2 diabetes mellitus with hypoglycemia without coma: Secondary | ICD-10-CM

## 2023-10-08 DIAGNOSIS — E883 Tumor lysis syndrome: Secondary | ICD-10-CM

## 2023-10-08 DIAGNOSIS — D649 Anemia, unspecified: Secondary | ICD-10-CM | POA: Diagnosis not present

## 2023-10-08 DIAGNOSIS — L8990 Pressure ulcer of unspecified site, unspecified stage: Secondary | ICD-10-CM | POA: Insufficient documentation

## 2023-10-08 DIAGNOSIS — R579 Shock, unspecified: Secondary | ICD-10-CM | POA: Diagnosis not present

## 2023-10-08 DIAGNOSIS — D696 Thrombocytopenia, unspecified: Secondary | ICD-10-CM

## 2023-10-08 DIAGNOSIS — C9191 Lymphoid leukemia, unspecified, in remission: Secondary | ICD-10-CM | POA: Diagnosis not present

## 2023-10-08 DIAGNOSIS — J9601 Acute respiratory failure with hypoxia: Secondary | ICD-10-CM | POA: Diagnosis not present

## 2023-10-08 LAB — CBC WITH DIFFERENTIAL/PLATELET
Abs Immature Granulocytes: 0.06 K/uL (ref 0.00–0.07)
Basophils Absolute: 0.1 K/uL (ref 0.0–0.1)
Basophils Relative: 1 %
Eosinophils Absolute: 0.1 K/uL (ref 0.0–0.5)
Eosinophils Relative: 0 %
HCT: 30.7 % — ABNORMAL LOW (ref 39.0–52.0)
Hemoglobin: 10.3 g/dL — ABNORMAL LOW (ref 13.0–17.0)
Immature Granulocytes: 0 %
Lymphocytes Relative: 67 %
Lymphs Abs: 13.1 K/uL — ABNORMAL HIGH (ref 0.7–4.0)
MCH: 29.8 pg (ref 26.0–34.0)
MCHC: 33.6 g/dL (ref 30.0–36.0)
MCV: 88.7 fL (ref 80.0–100.0)
Monocytes Absolute: 4.4 K/uL — ABNORMAL HIGH (ref 0.1–1.0)
Monocytes Relative: 22 %
Neutro Abs: 1.9 K/uL (ref 1.7–7.7)
Neutrophils Relative %: 10 %
Platelets: 15 K/uL — CL (ref 150–400)
RBC: 3.46 MIL/uL — ABNORMAL LOW (ref 4.22–5.81)
RDW: 17.5 % — ABNORMAL HIGH (ref 11.5–15.5)
Smear Review: NORMAL
WBC: 19.6 K/uL — ABNORMAL HIGH (ref 4.0–10.5)
nRBC: 0.6 % — ABNORMAL HIGH (ref 0.0–0.2)

## 2023-10-08 LAB — GLUCOSE, CAPILLARY
Glucose-Capillary: 100 mg/dL — ABNORMAL HIGH (ref 70–99)
Glucose-Capillary: 111 mg/dL — ABNORMAL HIGH (ref 70–99)
Glucose-Capillary: 120 mg/dL — ABNORMAL HIGH (ref 70–99)
Glucose-Capillary: 50 mg/dL — ABNORMAL LOW (ref 70–99)
Glucose-Capillary: 70 mg/dL (ref 70–99)
Glucose-Capillary: 72 mg/dL (ref 70–99)
Glucose-Capillary: 78 mg/dL (ref 70–99)
Glucose-Capillary: 79 mg/dL (ref 70–99)
Glucose-Capillary: 84 mg/dL (ref 70–99)
Glucose-Capillary: 87 mg/dL (ref 70–99)
Glucose-Capillary: 88 mg/dL (ref 70–99)
Glucose-Capillary: 89 mg/dL (ref 70–99)
Glucose-Capillary: 92 mg/dL (ref 70–99)
Glucose-Capillary: 99 mg/dL (ref 70–99)

## 2023-10-08 LAB — BLOOD CULTURE ID PANEL (REFLEXED) - BCID2

## 2023-10-08 LAB — DIC (DISSEMINATED INTRAVASCULAR COAGULATION)PANEL
D-Dimer, Quant: 16.53 ug{FEU}/mL — ABNORMAL HIGH (ref 0.00–0.50)
Fibrinogen: 409 mg/dL (ref 210–475)
INR: 1.9 — ABNORMAL HIGH (ref 0.8–1.2)
Platelets: 17 K/uL — CL (ref 150–400)
Prothrombin Time: 23 s — ABNORMAL HIGH (ref 11.4–15.2)
Smear Review: NONE SEEN
aPTT: 35 s (ref 24–36)

## 2023-10-08 LAB — COMPREHENSIVE METABOLIC PANEL WITH GFR
ALT: 36 U/L (ref 0–44)
ALT: 43 U/L (ref 0–44)
AST: 118 U/L — ABNORMAL HIGH (ref 15–41)
AST: 130 U/L — ABNORMAL HIGH (ref 15–41)
Albumin: 2.9 g/dL — ABNORMAL LOW (ref 3.5–5.0)
Albumin: 3.1 g/dL — ABNORMAL LOW (ref 3.5–5.0)
Alkaline Phosphatase: 131 U/L — ABNORMAL HIGH (ref 38–126)
Alkaline Phosphatase: 148 U/L — ABNORMAL HIGH (ref 38–126)
Anion gap: 15 (ref 5–15)
Anion gap: 16 — ABNORMAL HIGH (ref 5–15)
BUN: 44 mg/dL — ABNORMAL HIGH (ref 8–23)
BUN: 46 mg/dL — ABNORMAL HIGH (ref 8–23)
CO2: 18 mmol/L — ABNORMAL LOW (ref 22–32)
CO2: 21 mmol/L — ABNORMAL LOW (ref 22–32)
Calcium: 5.8 mg/dL — CL (ref 8.9–10.3)
Calcium: 5.9 mg/dL — CL (ref 8.9–10.3)
Chloride: 98 mmol/L (ref 98–111)
Chloride: 99 mmol/L (ref 98–111)
Creatinine, Ser: 2.31 mg/dL — ABNORMAL HIGH (ref 0.61–1.24)
Creatinine, Ser: 2.36 mg/dL — ABNORMAL HIGH (ref 0.61–1.24)
GFR, Estimated: 29 mL/min — ABNORMAL LOW (ref >60)
GFR, Estimated: 30 mL/min — ABNORMAL LOW (ref >60)
Glucose, Bld: 51 mg/dL — ABNORMAL LOW (ref 70–99)
Glucose, Bld: 76 mg/dL (ref 70–99)
Potassium: 4.8 mmol/L (ref 3.5–5.1)
Potassium: 7.3 mmol/L (ref 3.5–5.1)
Sodium: 132 mmol/L — ABNORMAL LOW (ref 135–145)
Sodium: 135 mmol/L (ref 135–145)
Total Bilirubin: 1.1 mg/dL (ref 0.0–1.2)
Total Bilirubin: 1.2 mg/dL (ref 0.0–1.2)
Total Protein: 4.7 g/dL — ABNORMAL LOW (ref 6.5–8.1)
Total Protein: 4.9 g/dL — ABNORMAL LOW (ref 6.5–8.1)

## 2023-10-08 LAB — RESPIRATORY PANEL BY PCR

## 2023-10-08 LAB — MAGNESIUM
Magnesium: 2.3 mg/dL (ref 1.7–2.4)
Magnesium: 2.4 mg/dL (ref 1.7–2.4)

## 2023-10-08 LAB — BPAM RBC
Blood Product Expiration Date: 202509212359
Blood Product Expiration Date: 202509212359
Blood Product Expiration Date: 202509212359
ISSUE DATE / TIME: 202508281642
ISSUE DATE / TIME: 202508281833
ISSUE DATE / TIME: 202508282231
Unit Type and Rh: 6200
Unit Type and Rh: 6200
Unit Type and Rh: 6200

## 2023-10-08 LAB — TYPE AND SCREEN
ABO/RH(D): A POS
Antibody Screen: NEGATIVE
Unit division: 0
Unit division: 0
Unit division: 0

## 2023-10-08 LAB — VITAMIN D 25 HYDROXY (VIT D DEFICIENCY, FRACTURES): Vit D, 25-Hydroxy: 32.86 ng/mL (ref 30–100)

## 2023-10-08 LAB — CBC
HCT: 30.3 % — ABNORMAL LOW (ref 39.0–52.0)
Hemoglobin: 10 g/dL — ABNORMAL LOW (ref 13.0–17.0)
MCH: 29.7 pg (ref 26.0–34.0)
MCHC: 33 g/dL (ref 30.0–36.0)
MCV: 89.9 fL (ref 80.0–100.0)
Platelets: 18 K/uL — CL (ref 150–400)
RBC: 3.37 MIL/uL — ABNORMAL LOW (ref 4.22–5.81)
RDW: 18.1 % — ABNORMAL HIGH (ref 11.5–15.5)
WBC: 17.8 K/uL — ABNORMAL HIGH (ref 4.0–10.5)
nRBC: 0.4 % — ABNORMAL HIGH (ref 0.0–0.2)

## 2023-10-08 LAB — PROTIME-INR
INR: 2 — ABNORMAL HIGH (ref 0.8–1.2)
INR: 2.1 — ABNORMAL HIGH (ref 0.8–1.2)
Prothrombin Time: 23.8 s — ABNORMAL HIGH (ref 11.4–15.2)
Prothrombin Time: 24.4 s — ABNORMAL HIGH (ref 11.4–15.2)

## 2023-10-08 LAB — URIC ACID: Uric Acid, Serum: 12.3 mg/dL — ABNORMAL HIGH (ref 3.7–8.6)

## 2023-10-08 LAB — FIBRINOGEN: Fibrinogen: 418 mg/dL (ref 210–475)

## 2023-10-08 LAB — POTASSIUM: Potassium: 4.5 mmol/L (ref 3.5–5.1)

## 2023-10-08 LAB — PHOSPHORUS
Phosphorus: 7.5 mg/dL — ABNORMAL HIGH (ref 2.5–4.6)
Phosphorus: 7.7 mg/dL — ABNORMAL HIGH (ref 2.5–4.6)

## 2023-10-08 LAB — IMMATURE PLATELET FRACTION: Immature Platelet Fraction: 6.1 % (ref 1.2–8.6)

## 2023-10-08 LAB — LACTIC ACID, PLASMA: Lactic Acid, Venous: 2.3 mmol/L (ref 0.5–1.9)

## 2023-10-08 MED ORDER — INSULIN ASPART 100 UNIT/ML IV SOLN
1.0000 [IU] | Freq: Once | INTRAVENOUS | Status: AC
  Administered 2023-10-08: 1 [IU] via INTRAVENOUS

## 2023-10-08 MED ORDER — SODIUM CHLORIDE 0.9 % IV SOLN: INTRAVENOUS | Status: DC

## 2023-10-08 MED ORDER — DEXTROSE-SODIUM CHLORIDE 5-0.9 % IV SOLN: INTRAVENOUS | Status: AC

## 2023-10-08 MED ORDER — SODIUM CHLORIDE 0.9 % IV SOLN
6.0000 mg | Freq: Once | INTRAVENOUS | Status: AC
  Administered 2023-10-08: 6 mg via INTRAVENOUS
  Filled 2023-10-08: qty 4

## 2023-10-08 MED ORDER — CALCIUM GLUCONATE-NACL 2-0.675 GM/100ML-% IV SOLN
2.0000 g | Freq: Once | INTRAVENOUS | Status: AC
  Administered 2023-10-08: 2000 mg via INTRAVENOUS
  Filled 2023-10-08: qty 100

## 2023-10-08 MED ORDER — DEXTROSE 50 % IV SOLN
25.0000 g | Freq: Once | INTRAVENOUS | Status: AC
  Administered 2023-10-08: 25 g via INTRAVENOUS
  Filled 2023-10-08: qty 50

## 2023-10-08 MED ORDER — PHYTONADIONE 5 MG PO TABS
5.0000 mg | ORAL_TABLET | Freq: Every day | ORAL | Status: AC
  Administered 2023-10-08 – 2023-10-12 (×5): 5 mg via ORAL
  Filled 2023-10-08 (×5): qty 1

## 2023-10-08 MED ORDER — SODIUM ZIRCONIUM CYCLOSILICATE 10 G PO PACK
10.0000 g | PACK | Freq: Once | ORAL | Status: AC
  Administered 2023-10-08: 10 g via ORAL
  Filled 2023-10-08: qty 1

## 2023-10-08 MED ORDER — ALBUTEROL SULFATE (2.5 MG/3ML) 0.083% IN NEBU
10.0000 mg | INHALATION_SOLUTION | Freq: Once | RESPIRATORY_TRACT | Status: AC
  Administered 2023-10-08: 10 mg via RESPIRATORY_TRACT
  Filled 2023-10-08: qty 12

## 2023-10-08 MED ORDER — CALCIUM GLUCONATE-NACL 1-0.675 GM/50ML-% IV SOLN
1.0000 g | Freq: Once | INTRAVENOUS | Status: AC
  Administered 2023-10-08: 1000 mg via INTRAVENOUS
  Filled 2023-10-08: qty 50

## 2023-10-08 MED ORDER — SODIUM CHLORIDE 0.9 % IV BOLUS
500.0000 mL | Freq: Once | INTRAVENOUS | Status: AC
  Administered 2023-10-08: 500 mL via INTRAVENOUS

## 2023-10-08 MED ORDER — SODIUM BICARBONATE 8.4 % IV SOLN
50.0000 meq | Freq: Once | INTRAVENOUS | Status: AC
  Administered 2023-10-08: 50 meq via INTRAVENOUS
  Filled 2023-10-08: qty 50

## 2023-10-08 MED ORDER — DEXTROSE 50 % IV SOLN
INTRAVENOUS | Status: AC
  Administered 2023-10-08: 50 mL
  Filled 2023-10-08: qty 50

## 2023-10-08 MED ORDER — CARMEX CLASSIC LIP BALM EX OINT
TOPICAL_OINTMENT | CUTANEOUS | Status: DC | PRN
  Filled 2023-10-08 (×2): qty 10

## 2023-10-08 NOTE — Progress Notes (Signed)
 Labs noted  Elevated uric acid, elevated potassium, low calcium  with high phosphate  Concern for tumor lysis syndrome CLL on chemotherapy  Dose of Rasburicase  Saline resuscitation Labs in a.m.

## 2023-10-08 NOTE — Inpatient Diabetes Management (Signed)
 Inpatient Diabetes Program Recommendations  AACE/ADA: New Consensus Statement on Inpatient Glycemic Control (2015)  Target Ranges:  Prepandial:   less than 140 mg/dL      Peak postprandial:   less than 180 mg/dL (1-2 hours)      Critically ill patients:  140 - 180 mg/dL   Lab Results  Component Value Date   GLUCAP 78 10/08/2023   HGBA1C 5.8 (H) 08/07/2023    Review of Glycemic Control  Latest Reference Range & Units 10/08/23 02:23 10/08/23 02:58 10/08/23 03:59 10/08/23 05:00 10/08/23 06:27 10/08/23 07:23 10/08/23 08:49  Glucose-Capillary 70 - 99 mg/dL 50 (L) 99 84 88 87 79 78  (L): Data is abnormally low Diabetes history: Type 1 DM? Outpatient Diabetes medications: Lantus  20 units at bedtime, Jardiance  25 mg QD Current orders for Inpatient glycemic control: Novolog  0-9 units Q4H Decadron  10 mg x 1  Inpatient Diabetes Program Recommendations:    Noted hypoglycemia. Consider reducing correction to Novolog  0-6 units Q4H.   Thanks, Tinnie Minus, MSN, RNC-OB Diabetes Coordinator [phone] (8a-5p)

## 2023-10-08 NOTE — Progress Notes (Signed)
 PHARMACY - PHYSICIAN COMMUNICATION CRITICAL VALUE ALERT - BLOOD CULTURE IDENTIFICATION (BCID)  Tyler Scott is an 68 y.o. male who presented to [facility] on 10/07/2023 with a chief complaint of nausea, near-syncope, generalized weakness and SOB.  Assessment:  Acute hypoxemic respiratory failure, concern for septic shock, weaning off pressors.  Now afebrile.  WBC elevated but improved. Lactic acid down to 2.3 (8/28)  8/28 Resp panel: none detected 8/28 MRSA PCR: not detected 8/28 Bcx: 1/3 bottles (aerobic) GPC 8/29 BCID: Staphylococcus species   Name of physician (or Provider) Contacted: Dr. Neda  Current antibiotics: Doxycycline    Changes to prescribed antibiotics recommended:  Suspect contaminant, no additional treatment needed.   Patient is on recommended antibiotics - No changes needed  Results for orders placed or performed during the hospital encounter of 10/07/23  Blood Culture ID Panel (Reflexed) (Collected: 10/07/2023  3:39 PM)  Result Value Ref Range   Enterococcus faecalis NOT DETECTED NOT DETECTED   Enterococcus Faecium NOT DETECTED NOT DETECTED   Listeria monocytogenes NOT DETECTED NOT DETECTED   Staphylococcus species DETECTED (A) NOT DETECTED   Staphylococcus aureus (BCID) NOT DETECTED NOT DETECTED   Staphylococcus epidermidis NOT DETECTED NOT DETECTED   Staphylococcus lugdunensis NOT DETECTED NOT DETECTED   Streptococcus species NOT DETECTED NOT DETECTED   Streptococcus agalactiae NOT DETECTED NOT DETECTED   Streptococcus pneumoniae NOT DETECTED NOT DETECTED   Streptococcus pyogenes NOT DETECTED NOT DETECTED   A.calcoaceticus-baumannii NOT DETECTED NOT DETECTED   Bacteroides fragilis NOT DETECTED NOT DETECTED   Enterobacterales NOT DETECTED NOT DETECTED   Enterobacter cloacae complex NOT DETECTED NOT DETECTED   Escherichia coli NOT DETECTED NOT DETECTED   Klebsiella aerogenes NOT DETECTED NOT DETECTED   Klebsiella oxytoca NOT DETECTED NOT DETECTED    Klebsiella pneumoniae NOT DETECTED NOT DETECTED   Proteus species NOT DETECTED NOT DETECTED   Salmonella species NOT DETECTED NOT DETECTED   Serratia marcescens NOT DETECTED NOT DETECTED   Haemophilus influenzae NOT DETECTED NOT DETECTED   Neisseria meningitidis NOT DETECTED NOT DETECTED   Pseudomonas aeruginosa NOT DETECTED NOT DETECTED   Stenotrophomonas maltophilia NOT DETECTED NOT DETECTED   Candida albicans NOT DETECTED NOT DETECTED   Candida auris NOT DETECTED NOT DETECTED   Candida glabrata NOT DETECTED NOT DETECTED   Candida krusei NOT DETECTED NOT DETECTED   Candida parapsilosis NOT DETECTED NOT DETECTED   Candida tropicalis NOT DETECTED NOT DETECTED   Cryptococcus neoformans/gattii NOT DETECTED NOT DETECTED    [name] 10/08/2023  3:04 PM

## 2023-10-08 NOTE — Progress Notes (Signed)
 eLink Physician-Brief Progress Note Patient Name: Tyler Scott DOB: [birth year] MRN: [id number]   Date of Service  10/08/2023  HPI/Events of Note  K+ 7.3  eICU Interventions  Hyperkalemia treatment protocol ordered.        Eleina Jergens U Crimson Dubberly 10/08/2023, 1:40 AM

## 2023-10-08 NOTE — Progress Notes (Signed)
 eLink Physician-Brief Progress Note Patient Name: Tyler Scott DOB: 17-Feb-1955 MRN: [id number]   Date of Service  10/08/2023  HPI/Events of Note  INR 2.1, platelets 15  eICU Interventions  FFP order canceled. Patient will get one unit of platelets.        [name] U [name] 10/08/2023, 1:46 AM

## 2023-10-08 NOTE — Progress Notes (Addendum)
 Tyler Scott   DOB:17-Jan-1956   FM#:[id number]      ASSESSMENT & PLAN:  Tyler Scott is a 68 year old male patient with oncologic history significant for CLL.  Patient admitted overnight 8/28 with complaints of generalized weakness, SOB, nausea, and near-syncope. Status post fall at home. Oncology following closely.   CLL/SLL/Low grade Lymphoma -- Diagnosed with CLL in 2012 -- Patient is s/p multiple lines of therapy including bendamustine , rituximab , ibrutinib , and calquence . -- Previously many interrupted cycles of therapy due to no shows for appointments. -- Most recently patient received Cycle 1 Bendamustine  (without Rituxan ) on 09/08/23. Patient was seen for follow up and toxicity check in outpatient oncology on 09/22/23.  -- Imaging done 8/28 shows extensive bulky lymphadenopathy throughout lower chest, abdomen, pelvis, and groin; similar to previous study. Also shows splenic enlargement. Bone lesions progressive since prior study.  -- Plan is to switch to venetoclax  -- Pending labs including flow cytometry -- Other labs pending: vit D, factor inhib.   -- Medical Oncology/Dr. Onesimo following closely.  Anemia, severe --Hemoglobin on arrival documented 1.7, however may have been inaccurate.  Patient received 3 units PRBC.   -- Hemoglobin improved to 10.0 today -- Recommend PRBC transfusion for HGB<7.0 -- continue to monitor CBC with differential  Thrombocytopenia, severe -- Platelets 10K on arrival -- Status post one unit platelets -- Platelets improved slightly to 18K -- Recommend transfusion for counts <20K or <50K with bleeding -- Monitor closely for bleeding  Electrolyte Imbalance -- Potassium noted <2.0 on arrival and increased to 7.3 post repletion. -- K+ currently stable 4.8 -- Continue hyperkalemia protocol as ordered -- Monitor closely and replete electrolytes  Generalized weakness Shortness of breath with near syncope -- Encourage patient to eat to  optimize his nutrition, wife states he is eating 20% of his food.   -- Not on oxygen, breathing well -- Continue good hydration, on IVF as this time. -- Continue supportive care  Code Status Full  Subjective:  Patient seen awake and alert laying in bed in ICU with multiple IV lines ongoing.  Patient's wife at bedside who recounts that she noticed a decline in his mentation and progressive weakness a day prior to admission, that prior to that he was okay following recent chemotherapy.  No other complaints offered. No acute distress noted.   Objective:   Intake/Output Summary (Last 24 hours) at 10/08/2023 0912 Last data filed at 10/08/2023 0800 Gross per 24 hour  Intake 3648.76 ml  Output 575 ml  Net 3073.76 ml     PHYSICAL EXAMINATION: ECOG PERFORMANCE STATUS: 3 - Symptomatic, >50% confined to bed  Vitals:   10/08/23 0830 10/08/23 0900  BP: (!) 97/55 114/73  Pulse: 99 (!) 105  Resp: (!) 24 (!) 28  Temp: 98.3 F (36.8 C)   SpO2: 98% 98%   Filed Weights   10/07/23 1525 10/07/23 2223 10/08/23 0448  Weight: 123 lb 7.3 oz (56 kg) 127 lb 13.9 oz (58 kg) 132 lb 4.4 oz (60 kg)    GENERAL: alert, no distress and comfortable +ill-appearing SKIN: +pale skin color, texture, turgor are normal, no rashes or significant lesions EYES: normal, conjunctiva are pink and non-injected, sclera clear OROPHARYNX: no exudate, no erythema and lips, buccal mucosa, and tongue normal  NECK: supple, thyroid  normal size, non-tender, without nodularity LYMPH: no palpable lymphadenopathy in the cervical, axillary or inguinal LUNGS: clear to auscultation and percussion with normal breathing effort HEART: regular rate & rhythm and no murmurs and  no lower extremity edema ABDOMEN: abdomen soft, non-tender and normal bowel sounds MUSCULOSKELETAL: no cyanosis of digits and no clubbing  PSYCH: alert & oriented x 3 with fluent speech NEURO: no focal motor/sensory deficits   All questions were answered. The  patient knows to call the clinic with any problems, questions or concerns.   The total time spent in the appointment was 40 minutes encounter with patient including review of chart and various tests results, discussions about plan of care and coordination of care plan  [name], NP 10/08/2023 9:12 AM    Labs Reviewed:  Lab Results  Component Value Date   WBC 17.8 (H) 10/08/2023   HGB 10.0 (L) 10/08/2023   HCT 30.3 (L) 10/08/2023   MCV 89.9 10/08/2023   PLT 18 (LL) 10/08/2023   Recent Labs    10/07/23 2151 10/07/23 2341 10/08/23 0725  NA 134* 132* 135  K 6.4* 7.3* 4.8  CL 97* 99 98  CO2 21* 18* 21*  GLUCOSE 45* 51* 76  BUN 42* 44* 46*  CREATININE 2.48* 2.36* 2.31*  CALCIUM  6.1* 5.9* 5.8*  GFRNONAA 28* 29* 30*  PROT 5.4* 4.9* 4.7*  ALBUMIN 3.4* 3.1* 2.9*  AST 132* 130* 118*  ALT 36 36 43  ALKPHOS 160* 148* 131*  BILITOT 1.3* 1.1 1.2    Studies Reviewed:  CT HEAD WO CONTRAST ( ) Result Date: 10/07/2023 CLINICAL DATA:  Recent fall with headaches, initial encounter EXAM: CT HEAD WITHOUT CONTRAST TECHNIQUE: Contiguous axial images were obtained from the base of the skull through the vertex without intravenous contrast. RADIATION DOSE REDUCTION: This exam was performed according to the departmental dose-optimization program which includes automated exposure control, adjustment of the mA and/or kV according to patient size and/or use of iterative reconstruction technique. COMPARISON:  None Available. FINDINGS: Brain: No evidence of acute infarction, hemorrhage, hydrocephalus, extra-axial collection or mass lesion/mass effect. Mild atrophic changes are noted. Vascular: No hyperdense vessel or unexpected calcification. Skull: Normal. Negative for fracture or focal lesion. Sinuses/Orbits: No acute finding. Other: None. IMPRESSION: No acute intracranial abnormality noted. Electronically Signed   By: Oneil Devonshire M.D.   On: 10/07/2023 21:12   CT ABDOMEN PELVIS W CONTRAST Result  Date: 10/07/2023 CLINICAL DATA:  Acute nonlocalized abdominal pain. History of CLL. Hypotensive [facility] with tachycardia and decreased oxygenation. EXAM: CT ABDOMEN AND PELVIS WITH CONTRAST TECHNIQUE: Multidetector CT imaging of the abdomen and pelvis was performed using the standard protocol following bolus administration of intravenous contrast. RADIATION DOSE REDUCTION: This exam was performed according to the departmental dose-optimization program which includes automated exposure control, adjustment of the mA and/or kV according to patient size and/or use of iterative reconstruction technique. CONTRAST:  OMNIPAQUE  IOHEXOL  300 MG/ML  SOLN COMPARISON:  08/06/2023 FINDINGS: Lower chest: Large left and small right pleural effusions. Basilar atelectasis or consolidation also greater on the left. Cardiac enlargement. Hepatobiliary: No focal liver abnormality is seen. No gallstones, gallbladder wall thickening, or biliary dilatation. Pancreas: Unremarkable. No pancreatic ductal dilatation or surrounding inflammatory changes. Spleen: Spleen is enlarged. Focal wedge-shaped defect in the posterior periphery likely representing splenic infarct. Adrenals/Urinary Tract: No adrenal gland nodules. Kidneys are symmetrical. No hydronephrosis or hydroureter. Bladder is normal. Stomach/Bowel: Stomach, small bowel, and colon are not abnormally distended. No wall thickening or inflammatory infiltration. Appendix is normal. Vascular/Lymphatic: Calcification of the aorta. No aneurysm. There is extensive bulky lymphadenopathy involving the retrocrural space, celiac axis, splenic hilar region, throughout the retroperitoneum, throughout the mesentery, bilateral anterior and  posterior pelvic chains, in the sciatic region, and in the groin regions. Largest retroperitoneal lymph node mass measures about 8.9 x 8.5 cm on transverse dimension. Measured at a similar level, there is no significant change since the prior study.  Reproductive: Prostate is unremarkable. Other: Small amount of free fluid along the pericolic gutters. No free air. Musculoskeletal: Degenerative changes in the spine. Lucent lesions demonstrated in the sacrum and pelvis, most prominent on the right. Focal lesions measure up to about 1.7 cm diameter. These lesions are progressive since prior study. IMPRESSION: 1. Extensive bulky lymphadenopathy throughout the lower chest, abdomen, pelvis, and groin regions. Lymphadenopathy is similar to the previous study. 2. Splenic enlargement with peripheral splenic infarct. This is likely due to lymphomatous involvement. 3. Small lucent lesions in the pelvis may represent lymphomatous involvement. Bone lesions appear progressive since prior study. 4. Moderate left and small right pleural effusions with basilar atelectasis, progressing since prior study. Cardiac enlargement. Electronically Signed   By: Elsie Gravely M.D.   On: 10/07/2023 17:51   DG Chest Port 1 View Result Date: 10/07/2023 CLINICAL DATA:  Questionable sepsis. EXAM: PORTABLE CHEST 1 VIEW COMPARISON:  Chest CT dated 08/07/2023. FINDINGS: Small left pleural effusion and left lung base atelectasis or infiltrate. No pneumothorax. The cardiac silhouette is within limits. Right paratracheal density consistent with adenopathy. No acute osseous pathology. IMPRESSION: 1. Small left pleural effusion and left lung base atelectasis or infiltrate. 2. Right paratracheal adenopathy. Electronically Signed   By: Vanetta Chou M.D.   On: 10/07/2023 15:59     ADDENDUM  .Patient was Personally and independently interviewed, examined and relevant elements of the history of present illness were reviewed in details and an assessment and plan was created. All elements of the patient's history of present illness , assessment and plan were discussed in details with Lisa Rouson NP. The above documentation reflects our combined findings assessment and plan. Patient recently  received second cycle of Bendamustine  Rituxan  for relapsed multidrug refractory CLL. He has been in poor functional status and has refused physical therapy evaluation and management though he received the referral and they called him more than a month ago. He has been eating only about 30 to 40% of his recommended dietary requirements. He did note that he has been feeling depressed overall and was recommended on multiple occasions to connect with his primary care physician to seek mental health help through the [facility]. He has not been very engaged in his decision making with regards to treatment however he is also not making a decision to pursue best supportive cares. We had discussed and were planning to switch him to venetoclax  Rituxan  therapy after his second cycle of Bendamustine  Rituxan . He was admitted with failure to thrive and dizziness rule out sepsis. Initial labs showing profound cytopenias were likely of lab errors since all the labs look diluted out. Patient received 3 doses of PRBCs and hemoglobin went up to 10 suggesting his baseline was around 7-8. He also has significant thrombocytopenia from his CLL potentially worsened by poor p.o. intake with nutritional deficiencies, chemotherapy.  He has been on increasing doses of weekly Nplate .  Last received Nplate  3 mcg/kg on 10/06/2023. Immature platelet fractions noted today are suppressed and not suggestive of ITP. He did not receive cycle 2-day 2 of Bendamustine . He is also noted to have acute kidney injury as a combination of poor p.o. intake and possible tumor lysis syndrome.  Uric acid levels elevated at 12.  He received rasburicase  today.  His initial labs also showed significant coagulopathy with an INR of more than 10 which was likely a dilutional lab error as well.  Repeat INR's are around 1.9-2 and are likely from vitamin K deficiency in the context of poor p.o. intake and chronic diarrhea. He has been started on vitamin K 5 mg p.o.  daily.  PT mixing study ordered Appreciate excellent critical care team cares. Ongoing goals of care discussion. Being evaluated for sepsis. Transfuse for hemoglobin less than 7 and platelets less than 20k in the context of coagulopathy and possible sepsis or less than 50k if bleeding. He previously had a confirmatory biopsy of his retroperitoneal adenopathy which continue to show CLL/SLL and did not show any transformed lymphoma of.  His LDH also remains normal and is not suggestive of transformation. Will send out a repeat flow cytometry to rule out any evidence of prolymphocytic transformation. If not we will plan to proceed with venetoclax  in the next couple of weeks.  Clarice Bonaventure MD MS

## 2023-10-08 NOTE — Progress Notes (Signed)
 NAME:  Tyler Scott, MRN:  [id number], DOB:  May 10, 1955, LOS: 1 ADMISSION DATE:  10/07/2023 CONSULTATION DATE:  10/07/2023 REFERRING MD:  Doretha - EDP, CHIEF COMPLAINT:  Hypotension   History of Present Illness:  68 year old man who presented to [facility] ED 8/28 with nausea, near-syncope, generalized weakness and SOB. PMHx significant for HTN, HLD, T1DM, chronic ITP (baseline Plt 40s), CLL with recent chemotherapy initiation ~6 weeks PTA.  Patient presented to [facility] ED from [facility] with multiple complaints; per family initially reported abdominal pain/nausea with dry heaves after his last infusion 8/27; he had persistent abdominal pain and poor PO intake. Patient was due for infusion today, 8/28 and when attempting to get to the car had an episode of near-syncope with SOB and generalized weakness. EMS was called and brought patient in where he was noted to be tachycardic, hypotensive and with mild AMS (improved shortly after arrival). Patient's wife notes hyperglycemia to 300s 8/28AM for which she administered insulin .  On ED arrival, patient was afebrile with HR 116, BP 95/67, RR 25, SpO2 94%. Labs were notable for WBC 14.6 (down from 17.1 8/27 with ANC 1100), Hgb 1.7 (8.2), Plt 10 (47). INR > 10 (1 two months PTA). CMP/lipase pending. Trop 46. LA 2.5 > 5.4. VBG 7.21/pCO2 < 18/pO2 < 31/bicarb 5.6. Initial CBG 40. CXR demonstrated small L pleural effusion and L lung base atelectasis with R paratracheal adenoapthyp; CT A/P showed extensive bulky LAD throughout the lower chest, abdomen, pelvis and groin regions (similar from prior), splenic enlargement with peripheral splenic infarct, small lucent lesions in the pelvis c/f lymphomatous involvement, moderate L and small R pleural effusions with atelectasis. Broad-spectrum antibiotics were started, Cx pending. Levophed  started for hypotension. Oncology consulted.  PCCM consulted for ICU admission.  Pertinent Medical History:   Past Medical  History:  Diagnosis Date   Allergy    Arthritis    CLL (chronic lymphocytic leukemia) (HCC)    stage III: asymptomatic; on observation   DIABETES MELLITUS, TYPE I, UNCONTROLLED 12/30/2006   insulin    Diabetic retinopathy    ERECTILE DYSFUNCTION, ORGANIC 12/30/2006   Hx of adenomatous polyp of colon 05/24/2014   HYPERLIPIDEMIA 12/30/2006   HYPERTENSION 12/30/2006   LOW BACK PAIN, ACUTE 12/21/2006   Significant Hospital Events: Including procedures, antibiotic start and stop dates in addition to other pertinent events   10/07/23 admitted to ICU  10/08/23-hemodynamics better, lab details better  Interim History / Subjective:  No overnight events Does not remember much of what happened yesterday outside but just felt very fatigued Appears to have slept well  Objective:  Blood pressure (!) 99/46, pulse 100, temperature 98.3 F (36.8 C), temperature source Oral, resp. rate (!) 24, height 5' 8 (1.727 m), weight 60 kg, SpO2 97%.        Intake/Output Summary (Last 24 hours) at 10/08/2023 1056 Last data filed at 10/08/2023 1055 Gross per 24 hour  Intake 3993.88 ml  Output 875 ml  Net 3118.88 ml   Filed Weights   10/07/23 1525 10/07/23 2223 10/08/23 0448  Weight: 56 kg 58 kg 60 kg   Physical Examination: General: Acutely ill-appearing HEENT: Dry mucous membranes, anicteric Neuro: Awake alert nonfocal exam CV: S1-S2 appreciated PULM: Clear breath sounds bilaterally GI: Soft, nontender, bowel sounds appreciated Extremities: no LE edema noted. Skin: Warm/dry, no rashes.  I reviewed last 24 h vitals and pain scores, last 48 h intake and output, last 24 h labs and trends, and last 24 h imaging results. Hypocalcemia  being corrected, hyperkalemia-corrected Thrombocytopenia-monitor  Resolved Hospital Problem List:    Assessment & Plan:   Acute hypoxemic respiratory failure -Wean oxygen as tolerated - Did not have any underlying lung disease, was not on oxygen at  baseline -Need may have been due to his hemodynamic instability  Undifferentiated shock Concern for septic shock -Fluid resuscitated - Did get transfused-received 3 units PRBC, 1 platelet, 2 units of FFP -Wean off pressors as tolerated  Acute on chronic anemia CLL - On chemotherapy - Oncology aware of admission  Type 2 diabetes Hypoglycemia - Continue to monitor - Hypoglycemia likely related to poor intake  Status post fall Head CT negative for any acute injury - Neurological monitoring  Acute kidney injury - Continue to monitor closely - Avoid nephrotoxic medications - Maintain renal perfusion  Risk of decompensation remains high  Best Practice: (right click and Reselect all SmartList Selections daily)   Diet/type: Regular consistency (see orders) DVT prophylaxis: SCD GI prophylaxis: PPI Lines: N/A Foley:  N/A Code Status:  full code Last date of multidisciplinary goals of care discussion [full code per family and patient]  Labs:  CBC: Recent Labs  Lab 10/01/23 1311 10/06/23 1218 10/07/23 1539 10/07/23 2341 10/08/23 0725 10/08/23 1027  WBC 12.7* 17.1* 14.6* 19.6* 17.8*  --   NEUTROABS 2.0 2.3 1.1* 1.9  --   --   HGB 8.1* 8.2* 1.7* 10.3* 10.0*  --   HCT 24.8* 24.8* 7.0* 30.7* 30.3*  --   MCV 91.9 92.2 116.7* 88.7 89.9  --   PLT 47* 47* 10* 15* 18* 17*   Basic Metabolic Panel: Recent Labs  Lab 10/06/23 1218 10/07/23 1539 10/07/23 2151 10/07/23 2341 10/08/23 0725  NA 136 149* 134* 132* 135  K 4.4 <2.0* 6.4* 7.3* 4.8  CL 100 >130* 97* 99 98  CO2 27 <7* 21* 18* 21*  GLUCOSE 252* 41* 45* 51* 76  BUN 15 10 42* 44* 46*  CREATININE 1.07 0.37* 2.48* 2.36* 2.31*  CALCIUM  8.1* <4.0* 6.1* 5.9* 5.8*  MG  --   --  2.4 2.3 2.4  PHOS  --   --  8.3* 7.7* 7.5*   GFR: Estimated Creatinine Clearance: 26.3 mL/min (A) (by C-G formula based on SCr of 2.31 mg/dL (H)). Recent Labs  Lab 10/06/23 1218 10/07/23 1539 10/07/23 1541 10/07/23 1751 10/07/23 2341  10/08/23 0725  WBC 17.1* 14.6*  --   --  19.6* 17.8*  LATICACIDVEN  --   --  2.5* 5.4* 2.3*  --    Liver Function Tests: Recent Labs  Lab 10/06/23 1218 10/07/23 1539 10/07/23 2151 10/07/23 2341 10/08/23 0725  AST 26 16 132* 130* 118*  ALT 12 <5 36 36 43  ALKPHOS 166* 27* 160* 148* 131*  BILITOT 1.1 <0.2 1.3* 1.1 1.2  PROT 5.7* <3.0* 5.4* 4.9* 4.7*  ALBUMIN 3.5 <1.5* 3.4* 3.1* 2.9*   Recent Labs  Lab 10/07/23 1539  LIPASE <10*   No results for input(s): AMMONIA in the last 168 hours.  ABG:    Component Value Date/Time   HCO3 5.6 (L) 10/07/2023 1539   ACIDBASEDEF 19.7 (H) 10/07/2023 1539   O2SAT NOT CALCULATED 10/07/2023 1539    Coagulation Profile: Recent Labs  Lab 10/07/23 1539 10/07/23 2151 10/07/23 2341 10/08/23 0725 10/08/23 1027  INR >10.0* 2.1* 2.1* 2.0* PENDING   Cardiac Enzymes: No results for input(s): CKTOTAL, CKMB, CKMBINDEX, TROPONINI in the last 168 hours.  HbA1C: Hgb A1c MFr Bld  Date/Time Value Ref Range Status  08/07/2023  12:43 AM 5.8 (H) 4.8 - 5.6 % Final    Comment:    (NOTE) Diagnosis of Diabetes The following HbA1c ranges recommended by the American Diabetes Association (ADA) may be used as an aid in the diagnosis of diabetes mellitus.  Hemoglobin             Suggested A1C NGSP%              Diagnosis  <5.7                   Non Diabetic  5.7-6.4                Pre-Diabetic  >6.4                   Diabetic  <7.0                   Glycemic control for                       adults with diabetes.    06/30/2016 03:09 PM 10.3 (H) 4.6 - 6.5 % Final    Comment:    Glycemic Control Guidelines for People with Diabetes:Non Diabetic:  <6%Goal of Therapy: <7%Additional Action Suggested:  >8%    CBG: Recent Labs  Lab 10/08/23 0500 10/08/23 0627 10/08/23 0723 10/08/23 0849 10/08/23 1014  GLUCAP 88 87 79 78 92   Review of Systems:   Review of Systems  Constitutional:  Positive for malaise/fatigue. Negative for  chills, fever and weight loss.  HENT:  Negative for congestion, sinus pain and sore throat.   Eyes: Negative.   Respiratory:  Positive for shortness of breath. Negative for cough, hemoptysis, sputum production and wheezing.   Cardiovascular:  Negative for chest pain, palpitations, orthopnea, claudication and leg swelling.  Gastrointestinal:  Positive for abdominal pain. Negative for heartburn, nausea and vomiting.  Genitourinary: Negative.   Musculoskeletal:  Negative for joint pain and myalgias.  Skin:  Negative for rash.  Neurological:  Negative for weakness.  Endo/Heme/Allergies: Negative.   Psychiatric/Behavioral: Negative.      Past Medical History:  He,  has a past medical history of Allergy, Arthritis, CLL (chronic lymphocytic leukemia) (HCC), DIABETES MELLITUS, TYPE I, UNCONTROLLED (12/30/2006), Diabetic retinopathy, ERECTILE DYSFUNCTION, ORGANIC (12/30/2006), adenomatous polyp of colon (05/24/2014), HYPERLIPIDEMIA (12/30/2006), HYPERTENSION (12/30/2006), and LOW BACK PAIN, ACUTE (12/21/2006).   Surgical History:   Past Surgical History:  Procedure Laterality Date   COLONOSCOPY     great right toe- Fused the I P joint       Social History:   reports that he has never smoked. He has never used smokeless tobacco. He reports current alcohol use of about 4.0 standard drinks of alcohol per week. He reports that he does not use drugs.   Family History:  His family history includes Alcohol abuse in an other family member; Colon cancer (age of onset: 53) in his brother; Diabetes Mellitus II in his mother. There is no history of Esophageal cancer, Rectal cancer, or Stomach cancer.   Allergies: No Known Allergies    The patient is critically ill with multiple organ systems failure and requires high complexity decision making for assessment and support, frequent evaluation and titration of therapies, application of advanced monitoring technologies and extensive interpretation of  multiple databases. Critical Care Time devoted to patient care services described in this note independent of APP/resident time (if applicable)  is 33 minutes.   Jennet Epley MD  [facility] Personal pager: See Amion If unanswered, please page CCM On-call: #[phone]

## 2023-10-09 DIAGNOSIS — E11649 Type 2 diabetes mellitus with hypoglycemia without coma: Secondary | ICD-10-CM | POA: Diagnosis not present

## 2023-10-09 DIAGNOSIS — C9191 Lymphoid leukemia, unspecified, in remission: Secondary | ICD-10-CM | POA: Diagnosis not present

## 2023-10-09 DIAGNOSIS — R579 Shock, unspecified: Secondary | ICD-10-CM | POA: Diagnosis not present

## 2023-10-09 DIAGNOSIS — J9601 Acute respiratory failure with hypoxia: Secondary | ICD-10-CM | POA: Diagnosis not present

## 2023-10-09 LAB — CBC WITH DIFFERENTIAL/PLATELET
Abs Immature Granulocytes: 0.11 K/uL — ABNORMAL HIGH (ref 0.00–0.07)
Abs Immature Granulocytes: 0.03 K/uL (ref 0.00–0.07)
Basophils Absolute: 0.1 K/uL (ref 0.0–0.1)
Basophils Absolute: 0.1 K/uL (ref 0.0–0.1)
Basophils Relative: 1 %
Basophils Relative: 1 %
Eosinophils Absolute: 0.1 K/uL (ref 0.0–0.5)
Eosinophils Absolute: 0.1 K/uL (ref 0.0–0.5)
Eosinophils Relative: 1 %
Eosinophils Relative: 1 %
HCT: 28.4 % — ABNORMAL LOW (ref 39.0–52.0)
HCT: 30.7 % — ABNORMAL LOW (ref 39.0–52.0)
Hemoglobin: 9.3 g/dL — ABNORMAL LOW (ref 13.0–17.0)
Hemoglobin: 10 g/dL — ABNORMAL LOW (ref 13.0–17.0)
Immature Granulocytes: 1 %
Immature Granulocytes: 0 %
Lymphocytes Relative: 68 %
Lymphocytes Relative: 67 %
Lymphs Abs: 7.1 K/uL — ABNORMAL HIGH (ref 0.7–4.0)
Lymphs Abs: 8 K/uL — ABNORMAL HIGH (ref 0.7–4.0)
MCH: 29.6 pg (ref 26.0–34.0)
MCH: 29.6 pg (ref 26.0–34.0)
MCHC: 32.7 g/dL (ref 30.0–36.0)
MCHC: 32.6 g/dL (ref 30.0–36.0)
MCV: 90.4 fL (ref 80.0–100.0)
MCV: 90.8 fL (ref 80.0–100.0)
Monocytes Absolute: 1.5 K/uL — ABNORMAL HIGH (ref 0.1–1.0)
Monocytes Absolute: 1.8 K/uL — ABNORMAL HIGH (ref 0.1–1.0)
Monocytes Relative: 15 %
Monocytes Relative: 16 %
Neutro Abs: 1.4 K/uL — ABNORMAL LOW (ref 1.7–7.7)
Neutro Abs: 1.7 K/uL (ref 1.7–7.7)
Neutrophils Relative %: 14 %
Neutrophils Relative %: 15 %
Platelets: 14 K/uL — CL (ref 150–400)
Platelets: 18 K/uL — CL (ref 150–400)
RBC: 3.14 MIL/uL — ABNORMAL LOW (ref 4.22–5.81)
RBC: 3.38 MIL/uL — ABNORMAL LOW (ref 4.22–5.81)
RDW: 18.6 % — ABNORMAL HIGH (ref 11.5–15.5)
RDW: 18.7 % — ABNORMAL HIGH (ref 11.5–15.5)
WBC: 10.3 K/uL (ref 4.0–10.5)
WBC: 11.8 K/uL — ABNORMAL HIGH (ref 4.0–10.5)
nRBC: 0.4 % — ABNORMAL HIGH (ref 0.0–0.2)
nRBC: 0.3 % — ABNORMAL HIGH (ref 0.0–0.2)

## 2023-10-09 LAB — BASIC METABOLIC PANEL WITH GFR
Anion gap: 14 (ref 5–15)
BUN: 36 mg/dL — ABNORMAL HIGH (ref 8–23)
CO2: 20 mmol/L — ABNORMAL LOW (ref 22–32)
Calcium: 6.1 mg/dL — CL (ref 8.9–10.3)
Chloride: 104 mmol/L (ref 98–111)
Creatinine, Ser: 1.58 mg/dL — ABNORMAL HIGH (ref 0.61–1.24)
GFR, Estimated: 48 mL/min — ABNORMAL LOW (ref >60)
Glucose, Bld: 84 mg/dL (ref 70–99)
Potassium: 4.1 mmol/L (ref 3.5–5.1)
Sodium: 138 mmol/L (ref 135–145)

## 2023-10-09 LAB — GLUCOSE, CAPILLARY
Glucose-Capillary: 89 mg/dL (ref 70–99)
Glucose-Capillary: 78 mg/dL (ref 70–99)
Glucose-Capillary: 74 mg/dL (ref 70–99)
Glucose-Capillary: 100 mg/dL — ABNORMAL HIGH (ref 70–99)
Glucose-Capillary: 67 mg/dL — ABNORMAL LOW (ref 70–99)
Glucose-Capillary: 139 mg/dL — ABNORMAL HIGH (ref 70–99)
Glucose-Capillary: 114 mg/dL — ABNORMAL HIGH (ref 70–99)
Glucose-Capillary: 117 mg/dL — ABNORMAL HIGH (ref 70–99)

## 2023-10-09 LAB — PT FACTOR INHIBITOR (MIXING STUDY)
PT 1:1NP: 11.4 s (ref 9.1–12.0)
PT: 17.2 s — ABNORMAL HIGH (ref 9.1–12.0)

## 2023-10-09 LAB — RASBURICASE - URIC ACID: Uric Acid, Serum: 5.1 mg/dL (ref 3.7–8.6)

## 2023-10-09 MED ORDER — POLYVINYL ALCOHOL 1.4 % OP SOLN
1.0000 [drp] | OPHTHALMIC | Status: DC | PRN
  Administered 2023-10-09 – 2023-10-21 (×10): 1 [drp] via OPHTHALMIC
  Filled 2023-10-09: qty 15

## 2023-10-09 MED ORDER — CALCIUM GLUCONATE-NACL 2-0.675 GM/100ML-% IV SOLN
2.0000 g | Freq: Once | INTRAVENOUS | Status: AC
  Administered 2023-10-09: 2000 mg via INTRAVENOUS
  Filled 2023-10-09: qty 100

## 2023-10-09 MED ORDER — SODIUM CHLORIDE 0.9% IV SOLUTION: Freq: Once | INTRAVENOUS | Status: AC

## 2023-10-09 MED ORDER — GUAIFENESIN-DM 100-10 MG/5ML PO SYRP
5.0000 mL | ORAL_SOLUTION | ORAL | Status: DC | PRN
  Administered 2023-10-09: 5 mL via ORAL
  Filled 2023-10-09: qty 10

## 2023-10-09 MED ORDER — DEXTROSE-SODIUM CHLORIDE 5-0.9 % IV SOLN: INTRAVENOUS | Status: AC

## 2023-10-09 NOTE — Progress Notes (Signed)
 NAME:  Tyler Scott, MRN:  [id number], DOB:  1955/04/09, LOS: 2 ADMISSION DATE:  10/07/2023 CONSULTATION DATE:  10/07/2023 REFERRING MD:  Doretha - EDP, CHIEF COMPLAINT:  Hypotension   History of Present Illness:  68 year old man who presented to [facility] ED 8/28 with nausea, near-syncope, generalized weakness and SOB. PMHx significant for HTN, HLD, T1DM, chronic ITP (baseline Plt 40s), CLL with recent chemotherapy initiation ~6 weeks PTA.  Patient presented to [facility] ED from [facility] with multiple complaints; per family initially reported abdominal pain/nausea with dry heaves after his last infusion 8/27; he had persistent abdominal pain and poor PO intake. Patient was due for infusion today, 8/28 and when attempting to get to the car had an episode of near-syncope with SOB and generalized weakness. EMS was called and brought patient in where he was noted to be tachycardic, hypotensive and with mild AMS (improved shortly after arrival). Patient's wife notes hyperglycemia to 300s 8/28AM for which she administered insulin .  On ED arrival, patient was afebrile with HR 116, BP 95/67, RR 25, SpO2 94%. Labs were notable for WBC 14.6 (down from 17.1 8/27 with ANC 1100), Hgb 1.7 (8.2), Plt 10 (47). INR > 10 (1 two months PTA). CMP/lipase pending. Trop 46. LA 2.5 > 5.4. VBG 7.21/pCO2 < 18/pO2 < 31/bicarb 5.6. Initial CBG 40. CXR demonstrated small L pleural effusion and L lung base atelectasis with R paratracheal adenoapthyp; CT A/P showed extensive bulky LAD throughout the lower chest, abdomen, pelvis and groin regions (similar from prior), splenic enlargement with peripheral splenic infarct, small lucent lesions in the pelvis c/f lymphomatous involvement, moderate L and small R pleural effusions with atelectasis. Broad-spectrum antibiotics were started, Cx pending. Levophed  started for hypotension. Oncology consulted.  PCCM consulted for ICU admission.  Pertinent Medical History:   Past Medical  History:  Diagnosis Date   Allergy    Arthritis    CLL (chronic lymphocytic leukemia) (HCC)    stage III: asymptomatic; on observation   DIABETES MELLITUS, TYPE I, UNCONTROLLED 12/30/2006   insulin    Diabetic retinopathy    ERECTILE DYSFUNCTION, ORGANIC 12/30/2006   Hx of adenomatous polyp of colon 05/24/2014   HYPERLIPIDEMIA 12/30/2006   HYPERTENSION 12/30/2006   LOW BACK PAIN, ACUTE 12/21/2006   Significant Hospital Events: Including procedures, antibiotic start and stop dates in addition to other pertinent events   10/07/23 admitted to ICU  10/08/23-hemodynamics better, lab details better  Interim History / Subjective:  No overnight events Does have diarrhea  Objective:  Blood pressure 123/66, pulse 93, temperature 98 F (36.7 C), resp. rate (!) 23, height 5' 8 (1.727 m), weight 60 kg, SpO2 98%.        Intake/Output Summary (Last 24 hours) at 10/09/2023 1113 Last data filed at 10/09/2023 0814 Gross per 24 hour  Intake 2235.78 ml  Output 2875 ml  Net -639.22 ml   Filed Weights   10/07/23 2223 10/08/23 0448 10/09/23 0500  Weight: 58 kg 60 kg 60 kg   Physical Examination: General: Middle-age, does not appear to be in acute distress, acutely ill-appearing HEENT: Moist oral mucosa, anicteric Neuro: Awake alert, nonfocal exam CV: S1-S2 appreciated PULM: Clear breath sounds bilaterally GI: Soft, nontender, bowel sounds appreciated Extremities: no LE edema noted. Skin: Warm/dry, no rashes.  I reviewed last 24 h vitals and pain scores, last 48 h intake and output, last 24 h labs and trends, and last 24 h imaging results. -Improvement in his labs -Thrombocytopenia persists, platelet transfusion today   Resolved  Hospital Problem List:    Assessment & Plan:   Acute hypoxemic respiratory failure - Managed to wean off oxygen supplementation  Undifferentiated shock Concern for septic shock - Was fluid resuscitated - Transfused 3 units packed red blood cell, 1  platelet, 2 units of FFP - Was able to wean off pressors  Acute on chronic anemia CLL - On chemotherapy  Concern for tumor lysis syndrome - Received rasburicase  - Uric acid did improve, AKI stable - Encouraged increased oral intake and fluids  Type 2 diabetes Hypoglycemia - Continue to monitor - Hypoglycemia likely related to poor intake Currently on D5 normal saline  S/p fall Head CT negative for any acute injury - Neurological monitoring  Acute kidney injury - Avoid nephrotoxic medications - Maintain renal perfusion - Renal parameters improving  Diarrhea - Patient came in with constipation -Will monitor for now  Coagulopathy - Corrected  Best Practice: (right click and Reselect all SmartList Selections daily)   Diet/type: Regular consistency (see orders) DVT prophylaxis: SCD GI prophylaxis: PPI Lines: N/A Foley:  N/A Code Status:  full code Last date of multidisciplinary goals of care discussion [full code per family and patient]  Labs:  CBC: Recent Labs  Lab 10/06/23 1218 10/07/23 1539 10/07/23 2341 10/08/23 0725 10/08/23 1027 10/09/23 0315  WBC 17.1* 14.6* 19.6* 17.8*  --  10.3  NEUTROABS 2.3 1.1* 1.9  --   --  1.4*  HGB 8.2* 1.7* 10.3* 10.0*  --  9.3*  HCT 24.8* 7.0* 30.7* 30.3*  --  28.4*  MCV 92.2 116.7* 88.7 89.9  --  90.4  PLT 47* 10* 15* 18* 17* 14*   Basic Metabolic Panel: Recent Labs  Lab 10/07/23 1539 10/07/23 2151 10/07/23 2341 10/08/23 0725 10/08/23 1349 10/09/23 0315  NA 149* 134* 132* 135  --  138  K <2.0* 6.4* 7.3* 4.8 4.5 4.1  CL >130* 97* 99 98  --  104  CO2 <7* 21* 18* 21*  --  20*  GLUCOSE 41* 45* 51* 76  --  84  BUN 10 42* 44* 46*  --  36*  CREATININE 0.37* 2.48* 2.36* 2.31*  --  1.58*  CALCIUM  <4.0* 6.1* 5.9* 5.8*  --  6.1*  MG  --  2.4 2.3 2.4  --   --   PHOS  --  8.3* 7.7* 7.5*  --   --    GFR: Estimated Creatinine Clearance: 38.5 mL/min (A) (by C-G formula based on SCr of 1.58 mg/dL (H)). Recent Labs  Lab  10/07/23 1539 10/07/23 1541 10/07/23 1751 10/07/23 2341 10/08/23 0725 10/09/23 0315  WBC 14.6*  --   --  19.6* 17.8* 10.3  LATICACIDVEN  --  2.5* 5.4* 2.3*  --   --    Liver Function Tests: Recent Labs  Lab 10/06/23 1218 10/07/23 1539 10/07/23 2151 10/07/23 2341 10/08/23 0725  AST 26 16 132* 130* 118*  ALT 12 <5 36 36 43  ALKPHOS 166* 27* 160* 148* 131*  BILITOT 1.1 <0.2 1.3* 1.1 1.2  PROT 5.7* <3.0* 5.4* 4.9* 4.7*  ALBUMIN 3.5 <1.5* 3.4* 3.1* 2.9*   Recent Labs  Lab 10/07/23 1539  LIPASE <10*   No results for input(s): AMMONIA in the last 168 hours.  ABG:    Component Value Date/Time   HCO3 5.6 (L) 10/07/2023 1539   ACIDBASEDEF 19.7 (H) 10/07/2023 1539   O2SAT NOT CALCULATED 10/07/2023 1539    Coagulation Profile: Recent Labs  Lab 10/07/23 1539 10/07/23 2151 10/07/23 2341 10/08/23 0725  10/08/23 1027  INR >10.0* 2.1* 2.1* 2.0* 1.9*   Cardiac Enzymes: No results for input(s): CKTOTAL, CKMB, CKMBINDEX, TROPONINI in the last 168 hours.  HbA1C: Hgb A1c MFr Bld  Date/Time Value Ref Range Status  08/07/2023 12:43 AM 5.8 (H) 4.8 - 5.6 % Final    Comment:    (NOTE) Diagnosis of Diabetes The following HbA1c ranges recommended by the American Diabetes Association (ADA) may be used as an aid in the diagnosis of diabetes mellitus.  Hemoglobin             Suggested A1C NGSP%              Diagnosis  <5.7                   Non Diabetic  5.7-6.4                Pre-Diabetic  >6.4                   Diabetic  <7.0                   Glycemic control for                       adults with diabetes.    06/30/2016 03:09 PM 10.3 (H) 4.6 - 6.5 % Final    Comment:    Glycemic Control Guidelines for People with Diabetes:Non Diabetic:  <6%Goal of Therapy: <7%Additional Action Suggested:  >8%    CBG: Recent Labs  Lab 10/08/23 1955 10/08/23 2332 10/09/23 0228 10/09/23 0405 10/09/23 0800  GLUCAP 111* 70 89 78 74   Review of Systems:   Review of  Systems  Constitutional:  Positive for malaise/fatigue. Negative for chills, fever and weight loss.  HENT:  Negative for congestion, sinus pain and sore throat.   Eyes: Negative.   Respiratory:  Positive for shortness of breath. Negative for cough, hemoptysis, sputum production and wheezing.   Cardiovascular:  Negative for chest pain, palpitations, orthopnea, claudication and leg swelling.  Gastrointestinal:  Positive for abdominal pain. Negative for heartburn, nausea and vomiting.  Genitourinary: Negative.   Musculoskeletal:  Negative for joint pain and myalgias.  Skin:  Negative for rash.  Neurological:  Negative for weakness.  Endo/Heme/Allergies: Negative.   Psychiatric/Behavioral: Negative.      Past Medical History:  He,  has a past medical history of Allergy, Arthritis, CLL (chronic lymphocytic leukemia) (HCC), DIABETES MELLITUS, TYPE I, UNCONTROLLED (12/30/2006), Diabetic retinopathy, ERECTILE DYSFUNCTION, ORGANIC (12/30/2006), adenomatous polyp of colon (05/24/2014), HYPERLIPIDEMIA (12/30/2006), HYPERTENSION (12/30/2006), and LOW BACK PAIN, ACUTE (12/21/2006).   Surgical History:   Past Surgical History:  Procedure Laterality Date   COLONOSCOPY     great right toe- Fused the I P joint       Social History:   reports that he has never smoked. He has never used smokeless tobacco. He reports current alcohol  use of about 4.0 standard drinks of alcohol  per week. He reports that he does not use drugs.   Family History:  His family history includes Alcohol  abuse in an other family member; Colon cancer (age of onset: 6) in his brother; Diabetes Mellitus II in his mother. There is no history of Esophageal cancer, Rectal cancer, or Stomach cancer.   Allergies: No Known Allergies   The patient is critically ill with multiple organ systems failure and requires high complexity decision making for assessment and support, frequent evaluation and titration of therapies,  application of  advanced monitoring technologies and extensive interpretation of multiple databases. Critical Care Time devoted to patient care services described in this note independent of APP/resident time (if applicable)  is 33 minutes.   Jennet Epley MD [facility] Personal pager: See Amion If unanswered, please page CCM On-call: #[phone]

## 2023-10-09 NOTE — Progress Notes (Signed)
 Tyler Scott   DOB:July 14, 1955   FM#:[id number]      ASSESSMENT & PLAN:  Tyler Scott is a 68 year old male patient with oncologic history significant for CLL.  Patient admitted overnight 8/28 with complaints of generalized weakness, SOB, nausea, and near-syncope. Status post fall at home. Oncology following closely.   CLL/SLL/Low grade Lymphoma -- Diagnosed with CLL in 2012 -- Patient is s/p multiple lines of therapy including bendamustine , rituximab , ibrutinib , and calquence . -- Previously many interrupted cycles of therapy due to no shows for appointments. -- Most recently patient received Cycle 1 Bendamustine  (without Rituxan ) on 09/08/23. Patient was seen for follow up and toxicity check in outpatient oncology on 09/22/23.  -- Imaging done 8/28 shows extensive bulky lymphadenopathy throughout lower chest, abdomen, pelvis, and groin; similar to previous study. Also shows splenic enlargement. Bone lesions progressive since prior study.  -- Plan is to switch to venetoclax  -- labs today show white blood cell 10.3, hemoglobin 9.3, MCV 90.4, platelets of 14.  Creatinine improved to 1.58 (down from 2.31 yesterday).  Potassium 4.1 with calcium  improved to 6.1.  Uric acid improved to 5.1 after dose of rasburicase  -- Medical Oncology following closely.  Anemia, severe --Hemoglobin on arrival documented 1.7, however may have been inaccurate.  Patient received 3 units PRBC.   -- Hemoglobin stable at 9.3 today -- Recommend PRBC transfusion for HGB<7.0 -- continue to monitor CBC with differential  Thrombocytopenia, severe -- Platelets 10K on arrival -- Status post one unit platelets -- Platelets stably low at 14.  Transfuse platelets today, recheck happening soon. -- Recommend transfusion for counts <20K or <50K with bleeding -- Monitor closely for bleeding  Electrolyte Imbalance -- Potassium noted <2.0 on arrival and increased to 7.3 post repletion. -- K+ currently stable 4.1 --  Continue hyperkalemia protocol as ordered -- Monitor closely and replete electrolytes  Generalized weakness Shortness of breath with near syncope -- Encourage patient to eat to optimize his nutrition, wife states he is eating better than prior.  -- Not on oxygen, breathing well -- Continue good hydration, on IVF as this time. -- Continue supportive care  Code Status Full  Subjective:  On exam today Tyler Scott reports he feels like he is doing well today.  He is eating and drinking water.  His blood sugars did drop down to 68 this morning and recovered with some orange juice.  He was able to walk from the bed to the chair but has not walked further.  He still requires two-person assist.  He does continue to have diarrhea but was previously constipated during this admission.  He notes that he is having a little bit of confusion but overall is improving.  Denies any fevers, chills, sweats.  Full 10 point ROS is otherwise negative.  Objective:   Intake/Output Summary (Last 24 hours) at 10/09/2023 1613 Last data filed at 10/09/2023 1550 Gross per 24 hour  Intake 3151.98 ml  Output 2525 ml  Net 626.98 ml     PHYSICAL EXAMINATION: ECOG PERFORMANCE STATUS: 3 - Symptomatic, >50% confined to bed  Vitals:   10/09/23 1500 10/09/23 1600  BP: 124/62 122/71  Pulse: 96 97  Resp: 18 (!) 22  Temp:    SpO2: 98% 97%   Filed Weights   10/07/23 2223 10/08/23 0448 10/09/23 0500  Weight: 127 lb 13.9 oz (58 kg) 132 lb 4.4 oz (60 kg) 132 lb 4.4 oz (60 kg)    GENERAL: alert, no distress and comfortable +ill-appearing SKIN: +  pale skin color, texture, turgor are normal, no rashes or significant lesions EYES: normal, conjunctiva are pink and non-injected, sclera clear OROPHARYNX: no exudate, no erythema and lips, buccal mucosa, and tongue normal  NECK: supple, thyroid  normal size, non-tender, without nodularity LYMPH: no palpable lymphadenopathy in the cervical, axillary or inguinal LUNGS: clear  to auscultation and percussion with normal breathing effort HEART: regular rate & rhythm and no murmurs and no lower extremity edema ABDOMEN: abdomen soft, non-tender and normal bowel sounds MUSCULOSKELETAL: no cyanosis of digits and no clubbing  PSYCH: alert & oriented x 3 with fluent speech NEURO: no focal motor/sensory deficits   All questions were answered. The patient knows to call the clinic with any problems, questions or concerns.   The total time spent in the appointment was 40 minutes encounter with patient including review of chart and various tests results, discussions about plan of care and coordination of care plan  Tyler IVAR Kidney, MD Department of Hematology/Oncology [facility] Phone: [phone] Pager: [phone] Email: [email or web address]      Labs Reviewed:  Lab Results  Component Value Date   WBC 10.3 10/09/2023   HGB 9.3 (L) 10/09/2023   HCT 28.4 (L) 10/09/2023   MCV 90.4 10/09/2023   PLT 14 (LL) 10/09/2023   Recent Labs    10/07/23 2151 10/07/23 2341 10/08/23 0725 10/08/23 1349 10/09/23 0315  NA 134* 132* 135  --  138  K 6.4* 7.3* 4.8 4.5 4.1  CL 97* 99 98  --  104  CO2 21* 18* 21*  --  20*  GLUCOSE 45* 51* 76  --  84  BUN 42* 44* 46*  --  36*  CREATININE 2.48* 2.36* 2.31*  --  1.58*  CALCIUM  6.1* 5.9* 5.8*  --  6.1*  GFRNONAA 28* 29* 30*  --  48*  PROT 5.4* 4.9* 4.7*  --   --   ALBUMIN 3.4* 3.1* 2.9*  --   --   AST 132* 130* 118*  --   --   ALT 36 36 43  --   --   ALKPHOS 160* 148* 131*  --   --   BILITOT 1.3* 1.1 1.2  --   --     Studies Reviewed:  CT HEAD WO CONTRAST ( ) Result Date: 10/07/2023 CLINICAL DATA:  Recent fall with headaches, initial encounter EXAM: CT HEAD WITHOUT CONTRAST TECHNIQUE: Contiguous axial images were obtained from the base of the skull through the vertex without intravenous contrast. RADIATION DOSE REDUCTION: This exam was performed according to the departmental  dose-optimization program which includes automated exposure control, adjustment of the mA and/or kV according to patient size and/or use of iterative reconstruction technique. COMPARISON:  None Available. FINDINGS: Brain: No evidence of acute infarction, hemorrhage, hydrocephalus, extra-axial collection or mass lesion/mass effect. Mild atrophic changes are noted. Vascular: No hyperdense vessel or unexpected calcification. Skull: Normal. Negative for fracture or focal lesion. Sinuses/Orbits: No acute finding. Other: None. IMPRESSION: No acute intracranial abnormality noted. Electronically Signed   By: Oneil Devonshire M.D.   On: 10/07/2023 21:12   CT ABDOMEN PELVIS W CONTRAST Result Date: 10/07/2023 CLINICAL DATA:  Acute nonlocalized abdominal pain. History of CLL. Hypotensive [facility] with tachycardia and decreased oxygenation. EXAM: CT ABDOMEN AND PELVIS WITH CONTRAST TECHNIQUE: Multidetector CT imaging of the abdomen and pelvis was performed using the standard protocol following bolus administration of intravenous contrast. RADIATION DOSE REDUCTION: This exam was performed according to the departmental dose-optimization  program which includes automated exposure control, adjustment of the mA and/or kV according to patient size and/or use of iterative reconstruction technique. CONTRAST:  OMNIPAQUE  IOHEXOL  300 MG/ML  SOLN COMPARISON:  08/06/2023 FINDINGS: Lower chest: Large left and small right pleural effusions. Basilar atelectasis or consolidation also greater on the left. Cardiac enlargement. Hepatobiliary: No focal liver abnormality is seen. No gallstones, gallbladder wall thickening, or biliary dilatation. Pancreas: Unremarkable. No pancreatic ductal dilatation or surrounding inflammatory changes. Spleen: Spleen is enlarged. Focal wedge-shaped defect in the posterior periphery likely representing splenic infarct. Adrenals/Urinary Tract: No adrenal gland nodules. Kidneys are symmetrical. No  hydronephrosis or hydroureter. Bladder is normal. Stomach/Bowel: Stomach, small bowel, and colon are not abnormally distended. No wall thickening or inflammatory infiltration. Appendix is normal. Vascular/Lymphatic: Calcification of the aorta. No aneurysm. There is extensive bulky lymphadenopathy involving the retrocrural space, celiac axis, splenic hilar region, throughout the retroperitoneum, throughout the mesentery, bilateral anterior and posterior pelvic chains, in the sciatic region, and in the groin regions. Largest retroperitoneal lymph node mass measures about 8.9 x 8.5 cm on transverse dimension. Measured at a similar level, there is no significant change since the prior study. Reproductive: Prostate is unremarkable. Other: Small amount of free fluid along the pericolic gutters. No free air. Musculoskeletal: Degenerative changes in the spine. Lucent lesions demonstrated in the sacrum and pelvis, most prominent on the right. Focal lesions measure up to about 1.7 cm diameter. These lesions are progressive since prior study. IMPRESSION: 1. Extensive bulky lymphadenopathy throughout the lower chest, abdomen, pelvis, and groin regions. Lymphadenopathy is similar to the previous study. 2. Splenic enlargement with peripheral splenic infarct. This is likely due to lymphomatous involvement. 3. Small lucent lesions in the pelvis may represent lymphomatous involvement. Bone lesions appear progressive since prior study. 4. Moderate left and small right pleural effusions with basilar atelectasis, progressing since prior study. Cardiac enlargement. Electronically Signed   By: Elsie Gravely M.D.   On: 10/07/2023 17:51   DG Chest Port 1 View Result Date: 10/07/2023 CLINICAL DATA:  Questionable sepsis. EXAM: PORTABLE CHEST 1 VIEW COMPARISON:  Chest CT dated 08/07/2023. FINDINGS: Small left pleural effusion and left lung base atelectasis or infiltrate. No pneumothorax. The cardiac silhouette is within limits. Right  paratracheal density consistent with adenopathy. No acute osseous pathology. IMPRESSION: 1. Small left pleural effusion and left lung base atelectasis or infiltrate. 2. Right paratracheal adenopathy. Electronically Signed   By: Vanetta Chou M.D.   On: 10/07/2023 15:59    Tyler IVAR Kidney, MD Department of Hematology/Oncology [facility] Phone: [phone] Pager: [phone] Email: [email or web address]

## 2023-10-09 NOTE — Progress Notes (Signed)
 Hypoglycemic Event  CBG: 67  Treatment: 4 oz juice/soda  Symptoms: None  Follow-up CBG: Time:100 CBG Result:1223  Possible Reasons for Event: Other : Patient has one IV access, D5 stopped momentarily, platelets going, placed order for IV team consult to get another access. Per patient he is hard to stick, both arms are bruised. Comments/MD notified:yes    [employer]

## 2023-10-10 DIAGNOSIS — R579 Shock, unspecified: Secondary | ICD-10-CM | POA: Diagnosis not present

## 2023-10-10 LAB — COMPREHENSIVE METABOLIC PANEL WITH GFR
ALT: 20 U/L (ref 0–44)
AST: 21 U/L (ref 15–41)
Albumin: 2.9 g/dL — ABNORMAL LOW (ref 3.5–5.0)
Alkaline Phosphatase: 109 U/L (ref 38–126)
Anion gap: 10 (ref 5–15)
BUN: 20 mg/dL (ref 8–23)
CO2: 21 mmol/L — ABNORMAL LOW (ref 22–32)
Calcium: 7 mg/dL — ABNORMAL LOW (ref 8.9–10.3)
Chloride: 106 mmol/L (ref 98–111)
Creatinine, Ser: 0.92 mg/dL (ref 0.61–1.24)
GFR, Estimated: >60 mL/min (ref >60)
Glucose, Bld: 117 mg/dL — ABNORMAL HIGH (ref 70–99)
Potassium: 3.6 mmol/L (ref 3.5–5.1)
Sodium: 137 mmol/L (ref 135–145)
Total Bilirubin: 1.2 mg/dL (ref 0.0–1.2)
Total Protein: 4.5 g/dL — ABNORMAL LOW (ref 6.5–8.1)

## 2023-10-10 LAB — GLUCOSE, CAPILLARY
Glucose-Capillary: 104 mg/dL — ABNORMAL HIGH (ref 70–99)
Glucose-Capillary: 107 mg/dL — ABNORMAL HIGH (ref 70–99)
Glucose-Capillary: 111 mg/dL — ABNORMAL HIGH (ref 70–99)
Glucose-Capillary: 126 mg/dL — ABNORMAL HIGH (ref 70–99)
Glucose-Capillary: 93 mg/dL (ref 70–99)
Glucose-Capillary: 98 mg/dL (ref 70–99)

## 2023-10-10 LAB — CBC WITH DIFFERENTIAL/PLATELET
Abs Immature Granulocytes: 0.04 K/uL (ref 0.00–0.07)
Basophils Absolute: 0.1 K/uL (ref 0.0–0.1)
Basophils Absolute: 0.1 K/uL (ref 0.0–0.1)
Basophils Relative: 1 %
Basophils Relative: 1 %
Eosinophils Absolute: 0.2 K/uL (ref 0.0–0.5)
Eosinophils Absolute: 0.2 K/uL (ref 0.0–0.5)
Eosinophils Relative: 2 %
Eosinophils Relative: 1 %
HCT: 27.3 % — ABNORMAL LOW (ref 39.0–52.0)
HCT: 33.4 % — ABNORMAL LOW (ref 39.0–52.0)
Hemoglobin: 8.7 g/dL — ABNORMAL LOW (ref 13.0–17.0)
Hemoglobin: 10.4 g/dL — ABNORMAL LOW (ref 13.0–17.0)
Immature Granulocytes: 0 %
Lymphocytes Relative: 69 %
Lymphocytes Relative: 73 %
Lymphs Abs: 5.7 K/uL — ABNORMAL HIGH (ref 0.7–4.0)
Lymphs Abs: 10.3 K/uL — ABNORMAL HIGH (ref 0.7–4.0)
MCH: 29.4 pg (ref 26.0–34.0)
MCH: 29.1 pg (ref 26.0–34.0)
MCHC: 31.9 g/dL (ref 30.0–36.0)
MCHC: 31.1 g/dL (ref 30.0–36.0)
MCV: 92.2 fL (ref 80.0–100.0)
MCV: 93.3 fL (ref 80.0–100.0)
Monocytes Absolute: 1.2 K/uL — ABNORMAL HIGH (ref 0.1–1.0)
Monocytes Absolute: 1.7 K/uL — ABNORMAL HIGH (ref 0.1–1.0)
Monocytes Relative: 14 %
Monocytes Relative: 12 %
Neutro Abs: 1.2 K/uL — ABNORMAL LOW (ref 1.7–7.7)
Neutro Abs: 1.8 K/uL (ref 1.7–7.7)
Neutrophils Relative %: 14 %
Neutrophils Relative %: 13 %
Platelets: 15 K/uL — CL (ref 150–400)
Platelets: 27 K/uL — CL (ref 150–400)
RBC: 2.96 MIL/uL — ABNORMAL LOW (ref 4.22–5.81)
RBC: 3.58 MIL/uL — ABNORMAL LOW (ref 4.22–5.81)
RDW: 18.4 % — ABNORMAL HIGH (ref 11.5–15.5)
RDW: 18.2 % — ABNORMAL HIGH (ref 11.5–15.5)
WBC: 8.4 K/uL (ref 4.0–10.5)
WBC: 14.1 K/uL — ABNORMAL HIGH (ref 4.0–10.5)
nRBC: 0 % (ref 0.0–0.2)
nRBC: 0.3 % — ABNORMAL HIGH (ref 0.0–0.2)

## 2023-10-10 LAB — CULTURE, BLOOD (ROUTINE X 2)

## 2023-10-10 MED ORDER — TRAMADOL HCL 50 MG PO TABS
50.0000 mg | ORAL_TABLET | Freq: Four times a day (QID) | ORAL | Status: DC | PRN
  Administered 2023-10-12 – 2023-10-23 (×6): 50 mg via ORAL
  Filled 2023-10-10 (×6): qty 1

## 2023-10-10 MED ORDER — ACYCLOVIR 400 MG PO TABS
400.0000 mg | ORAL_TABLET | Freq: Every day | ORAL | Status: DC
  Administered 2023-10-10 – 2023-10-23 (×14): 400 mg via ORAL
  Filled 2023-10-10 (×14): qty 1

## 2023-10-10 MED ORDER — SIMVASTATIN 20 MG PO TABS
20.0000 mg | ORAL_TABLET | Freq: Every day | ORAL | Status: DC
  Administered 2023-10-10 – 2023-10-23 (×14): 20 mg via ORAL
  Filled 2023-10-10 (×14): qty 1

## 2023-10-10 MED ORDER — GABAPENTIN 100 MG PO CAPS
100.0000 mg | ORAL_CAPSULE | Freq: Three times a day (TID) | ORAL | Status: DC
  Administered 2023-10-10 – 2023-10-23 (×42): 100 mg via ORAL
  Filled 2023-10-10 (×42): qty 1

## 2023-10-10 MED ORDER — MORPHINE SULFATE (PF) 2 MG/ML IV SOLN
2.0000 mg | INTRAVENOUS | Status: DC | PRN
  Administered 2023-10-10 – 2023-10-23 (×7): 2 mg via INTRAVENOUS
  Filled 2023-10-10 (×7): qty 1

## 2023-10-10 MED ORDER — SODIUM CHLORIDE 0.9% IV SOLUTION: Freq: Once | INTRAVENOUS | Status: AC

## 2023-10-10 MED ORDER — HYDROMORPHONE HCL 2 MG PO TABS
2.0000 mg | ORAL_TABLET | Freq: Every day | ORAL | Status: DC
  Administered 2023-10-10 – 2023-10-23 (×14): 2 mg via ORAL
  Filled 2023-10-10 (×14): qty 1

## 2023-10-10 MED ORDER — DOXEPIN HCL 25 MG PO CAPS
25.0000 mg | ORAL_CAPSULE | Freq: Every day | ORAL | Status: DC
  Administered 2023-10-10 – 2023-10-23 (×14): 25 mg via ORAL
  Filled 2023-10-10 (×14): qty 1

## 2023-10-10 MED ORDER — ALLOPURINOL 300 MG PO TABS
150.0000 mg | ORAL_TABLET | Freq: Every day | ORAL | Status: DC
  Administered 2023-10-10 – 2023-10-23 (×14): 150 mg via ORAL
  Filled 2023-10-10: qty 1
  Filled 2023-10-10 (×2): qty 2
  Filled 2023-10-10 (×11): qty 1

## 2023-10-10 MED ORDER — METHOCARBAMOL 500 MG PO TABS
500.0000 mg | ORAL_TABLET | Freq: Every day | ORAL | Status: DC
Start: 2023-10-10 — End: 2023-10-24
  Administered 2023-10-10 – 2023-10-23 (×14): 500 mg via ORAL
  Filled 2023-10-10 (×14): qty 1

## 2023-10-10 NOTE — Progress Notes (Signed)
 eLink Physician-Brief Progress Note Patient Name: Tyler Scott DOB: 04-15-1955 MRN: [id number]   Date of Service  10/10/2023  HPI/Events of Note  Plt 15k Per heme, Recommend transfusion for counts <20K   eICU Interventions  Transfuse 2u Platelets     Intervention Category Intermediate Interventions: Coagulopathy - evaluation and management  Colin Ellers 10/10/2023, 4:21 AM

## 2023-10-10 NOTE — Progress Notes (Signed)
 PROGRESS NOTE    [name]  FMW:[id number] DOB: 12-Oct-1955 DOA: 10/07/2023 PCP: [facility]    Brief Narrative:   Tyler Scott is a 68 y.o. male with past medical history significant for CLL with recent initiation of chemotherapy, HTN, HLD, DM 1, chronic ITP (baseline Plt 40's) who presented to [facility] ED from the cancer center with complaints of abdominal pain, nausea, poor oral intake, generalized weakness with near syncope and shortness of breath.  Patient was due for next chemotherapy infusion today, 8/28 and when attempting to get to the car had an episode of near syncope with associated shortness of breath and generalized weakness.  EMS was activated in which she was noted to be tachycardic, hypotensive with mild AMS (improved shortly after arrival).  Patient's wife also noted hyperglycemia to the 300s the morning of 10/07/2023 in which she administered insulin .  On ED arrival, patient was afebrile with HR 116, BP 95/67, RR 25, SpO2 94%. Labs were notable for WBC 14.6 (down from 17.1 8/27 with ANC 1100), Hgb 1.7 (8.2), Plt 10 (47). INR > 10 (1 two months PTA). CMP/lipase pending. Trop 46. LA 2.5 > 5.4. VBG 7.21/pCO2 < 18/pO2 < 31/bicarb 5.6. Initial CBG 40. CXR demonstrated small L pleural effusion and L lung base atelectasis with R paratracheal adenoapthyp; CT A/P showed extensive bulky LAD throughout the lower chest, abdomen, pelvis and groin regions (similar from prior), splenic enlargement with peripheral splenic infarct, small lucent lesions in the pelvis c/f lymphomatous involvement, moderate L and small R pleural effusions with atelectasis. Broad-spectrum antibiotics were started, Cx pending. Levophed  started for hypotension. Oncology consulted. PCCM consulted for ICU admission.  Significant hospital events: 8/28: Admit ICU, started on vasopressors, oncology consulted; transfused 2 unit PRBC 8/29: Levophed  titrated off; received rasburicase , 1 unit platelets, 1  unit PRBC transfusion 8/30: 1 unit platelet transfusion 8/31: Transferred to [facility]; 2 unit platelet transfusion  Assessment & Plan:   Shock, undifferentiated Concern for sepsis, ruled out Patient presenting with weakness and noted to be hypotensive, initially admitted to the intensive care unit on vasopressors.  Suspect etiology likely secondary to severe dehydration, poor oral intake with recent chemotherapy infusion as well as tumor lysis syndrome as above.  Initially was started on empiric antibiotics with vancomycin , cefepime  and doxycycline  which were stopped.  Acute hypoxemic respiratory failure: Resolved Supplemental oxygen weaned off.  CLL/SLL/low-grade lymphoma Tumor lysis syndrome Patient initially diagnosed with CLL 2012. Most recently patient received Cycle 1 Bendamustine  (without Rituxan ) on 09/08/23.  Imaging 8/29 with extensive bulky lymphadenopathy throughout the lower chest, abdomen, pelvis and groin similar to previous study also with splenic enlargement, noted bone lesions progressive since prior study.  Uric acid 12.3, received rasburicase  on 8/28. -- Uric acid 12.3>5.1 -- Further per oncology  Acute renal failure: Resolved Suspect etiology multifactorial with ATN from hypotension/shock as well as prerenal azotemia from severe dehydration.  No hydronephrosis noted on CT abdomen/pelvis at time of admission. -- Ct 2.48>2.36>>1.58>0.92 -- Discontinue IV fluids today, encourage oral hydration -- BMP in am  Anemia, severe Hemoglobin on arrival 1.7; transfused 3 unit PRBCs. -- Hgb 1.7>9.3>10.0>8.7 -- Transfuse for hemoglobin less than 7.0 -- CBC daily  Thrombocytopenia, severe Platelet count 10K arrival.  Received 1 unit platelet on 8/29, 8/30 -- Plt 10>15>18>>14>18>15 -- Transfused 2 unit platelets today -- Repeat CBC 2 hours following transfusion -- transfuse for counts <20K or <50K with bleeding  -- CBC with diff daily  Hypokalemia Repleted.  Potassium 3.6  today.  HTN Patient was notably hypotensive/shock as above at time of arrival. -- BP 143/64, stable -- Continue monitor BP closely  HLD -- Zocor  20 mg p.o. daily  Type 1 diabetes mellitus On Jardiance  12.5 mg p.o. daily, Lantus  10-30 units [place] Prn at bedtime at baseline -- Sensitive SSI for coverage  Chronic pain Patient reportedly on MS Contin  15 mg p.o. nightly, gabapentin  100 mg p.o. 3 times daily Dilaudid  2 mg p.o. nightly Robaxin  500 mg p.o. nightly -- Hold home MS Contin  -- Continue gabapentin  100 mg PO TID -- Continue dialudid 2 mg PO at bedtime -- Continue Robaxin  500 mg PO qHS  Weakness/debility/deconditioning/gait disturbance: --PT/OT evaluation   DVT prophylaxis: SCDs Start: 10/07/23 1835    Code Status: Full Code Family Communication: No family present bedside this morning  Disposition Plan:  Level of care: ICU Status is: Inpatient Remains inpatient appropriate because: Platelet transfusion, pending therapy evaluation    Consultants:  PCCM - singed off 8/31 Medical Oncology  Procedures:  none  Antimicrobials:  Cefepime  8/28 - 8/28 Vancomycin  8/28 - 8/28 Doxycycline  8/28 - 8/30   Subjective: Patient seen examined bedside, lying in bed.  Complaining about multiple lab draws.  Trying to order breakfast.  Discussed with PCCM, Dr. Neda this am.  Transfusing 2 additional platelet units today for platelet count less than 20 per oncology recommendations.  Discussed will need to start working with physical therapy due to his significant deconditioning.  No other questions, concerns or complaints at this time.  Denies headache, no dizziness, no chest pain, no palpitations, no shortness of breath, no abdominal pain, no fever/chills/night sweats, no nausea/vomiting.  No acute concerns overnight per nursing staff.  Objective: Vitals:   10/10/23 0800 10/10/23 0900 10/10/23 0946 10/10/23 1005  BP: (!) 148/83 (!) 151/75 (!) (P) 144/69 (!) 143/64  Pulse: 97 (!)  102  98  Resp: 19 16  (!) 26  Temp: 98 F (36.7 C)  (P) 98.2 F (36.8 C) 98.2 F (36.8 C)  TempSrc: Oral  (P) Oral Oral  SpO2: 97% 100%  98%  Weight:      Height:        Intake/Output Summary (Last 24 hours) at 10/10/2023 1010 Last data filed at 10/10/2023 0946 Gross per 24 hour  Intake 3053.95 ml  Output 100 ml  Net 2953.95 ml   Filed Weights   10/08/23 0448 10/09/23 0500 10/10/23 0700  Weight: 60 kg 60 kg 60 kg    Examination:  Physical Exam: GEN: NAD, alert and oriented x 3, ill in appearance HEENT: NCAT, PERRL, EOMI, sclera clear, MMM PULM: CTAB w/o wheezes/crackles, normal respiratory effort, room air CV: RRR w/o M/G/R GI: abd soft, NTND, + BS MSK: no peripheral edema, moves all extremity independently NEURO: No focal neurological deficit PSYCH: normal mood/affect Integumentary: No concerning rashes/lesions/wounds noted on exposed skin surfaces    Data Reviewed: I have personally reviewed following labs and imaging studies  CBC: Recent Labs  Lab 10/07/23 1539 10/07/23 2341 10/08/23 0725 10/08/23 1027 10/09/23 0315 10/09/23 1609 10/10/23 0345  WBC 14.6* 19.6* 17.8*  --  10.3 11.8* 8.4  NEUTROABS 1.1* 1.9  --   --  1.4* 1.7 1.2*  HGB 1.7* 10.3* 10.0*  --  9.3* 10.0* 8.7*  HCT 7.0* 30.7* 30.3*  --  28.4* 30.7* 27.3*  MCV 116.7* 88.7 89.9  --  90.4 90.8 92.2  PLT 10* 15* 18* 17* 14* 18* 15*   Basic Metabolic Panel: Recent Labs  Lab 10/07/23 2151  10/07/23 2341 10/08/23 0725 10/08/23 1349 10/09/23 0315 10/10/23 0345  NA 134* 132* 135  --  138 137  K 6.4* 7.3* 4.8 4.5 4.1 3.6  CL 97* 99 98  --  104 106  CO2 21* 18* 21*  --  20* 21*  GLUCOSE 45* 51* 76  --  84 117*  BUN 42* 44* 46*  --  36* 20  CREATININE 2.48* 2.36* 2.31*  --  1.58* 0.92  CALCIUM  6.1* 5.9* 5.8*  --  6.1* 7.0*  MG 2.4 2.3 2.4  --   --   --   PHOS 8.3* 7.7* 7.5*  --   --   --    GFR: Estimated Creatinine Clearance: 66.1 mL/min (by C-G formula based on SCr of 0.92 mg/dL). Liver  Function Tests: Recent Labs  Lab 10/07/23 1539 10/07/23 2151 10/07/23 2341 10/08/23 0725 10/10/23 0345  AST 16 132* 130* 118* 21  ALT <5 36 36 43 20  ALKPHOS 27* 160* 148* 131* 109  BILITOT <0.2 1.3* 1.1 1.2 1.2  PROT <3.0* 5.4* 4.9* 4.7* 4.5*  ALBUMIN <1.5* 3.4* 3.1* 2.9* 2.9*   Recent Labs  Lab 10/07/23 1539  LIPASE <10*   No results for input(s): AMMONIA in the last 168 hours. Coagulation Profile: Recent Labs  Lab 10/07/23 1539 10/07/23 2151 10/07/23 2341 10/08/23 0725 10/08/23 1027  INR >10.0* 2.1* 2.1* 2.0* 1.9*   Cardiac Enzymes: No results for input(s): CKTOTAL, CKMB, CKMBINDEX, TROPONINI in the last 168 hours. BNP (last 3 results) No results for input(s): PROBNP in the last 8760 hours. HbA1C: No results for input(s): HGBA1C in the last 72 hours. CBG: Recent Labs  Lab 10/09/23 1657 10/09/23 2009 10/09/23 2308 10/10/23 0306 10/10/23 0813  GLUCAP 139* 114* 117* 126* 98   Lipid Profile: No results for input(s): CHOL, HDL, LDLCALC, TRIG, CHOLHDL, LDLDIRECT in the last 72 hours. Thyroid  Function Tests: No results for input(s): TSH, T4TOTAL, FREET4, T3FREE, THYROIDAB in the last 72 hours. Anemia Panel: No results for input(s): VITAMINB12, FOLATE, FERRITIN, TIBC, IRON, RETICCTPCT in the last 72 hours. Sepsis Labs: Recent Labs  Lab 10/07/23 1541 10/07/23 1751 10/07/23 2341  LATICACIDVEN 2.5* 5.4* 2.3*    Recent Results (from the past 240 hours)  Blood Culture (routine x 2)     Status: Abnormal   Collection Time: 10/07/23  3:39 PM   Specimen: BLOOD RIGHT ARM  Result Value Ref Range Status   Specimen Description   Final    BLOOD RIGHT ARM Performed at [facility], [address] [place], [place] [address]    Special Requests   Final    BOTTLES DRAWN AEROBIC ONLY Blood Culture results may not be optimal due to an inadequate volume of blood received in culture bottles Performed at [facility], [address], [place], [place] [address]    Culture  Setup Time   Final    GRAM POSITIVE COCCI IN CLUSTERS AEROBIC BOTTLE ONLY CRITICAL RESULT CALLED TO, READ BACK BY AND VERIFIED WITH: PHARMD CHRISTINE SHADE ON 10/08/23 @ 1501 BY DRT    Culture (A)  Final    STAPHYLOCOCCUS HOMINIS THE SIGNIFICANCE OF ISOLATING THIS ORGANISM FROM A SINGLE SET OF BLOOD CULTURES WHEN MULTIPLE SETS ARE DRAWN IS UNCERTAIN. PLEASE NOTIFY THE MICROBIOLOGY DEPARTMENT WITHIN ONE WEEK IF SPECIATION AND SENSITIVITIES ARE REQUIRED. Performed at [facility], [address], [place], [place] [address]    Report Status 10/10/2023 FINAL  Final  Blood Culture ID  Panel (Reflexed)     Status: Abnormal   Collection Time: 10/07/23  3:39 PM  Result Value Ref Range Status   Enterococcus faecalis NOT DETECTED NOT DETECTED Final   Enterococcus Faecium NOT DETECTED NOT DETECTED Final   Listeria monocytogenes NOT DETECTED NOT DETECTED Final   Staphylococcus species DETECTED (A) NOT DETECTED Final    Comment: CRITICAL RESULT CALLED TO, READ BACK BY AND VERIFIED WITH: PHARMD CHRISTINE SHADE ON 10/08/23 @ 1501 BY DRT    Staphylococcus aureus (BCID) NOT DETECTED NOT DETECTED Final   Staphylococcus epidermidis NOT DETECTED NOT DETECTED Final   Staphylococcus lugdunensis NOT DETECTED NOT DETECTED Final   Streptococcus species NOT DETECTED NOT DETECTED Final   Streptococcus agalactiae NOT DETECTED NOT DETECTED Final   Streptococcus pneumoniae NOT DETECTED NOT DETECTED Final   Streptococcus pyogenes NOT DETECTED NOT DETECTED Final   A.calcoaceticus-baumannii NOT DETECTED NOT DETECTED Final   Bacteroides fragilis NOT DETECTED NOT DETECTED Final   Enterobacterales NOT DETECTED NOT DETECTED Final   Enterobacter cloacae complex NOT DETECTED NOT DETECTED Final   Escherichia coli NOT DETECTED NOT DETECTED Final   Klebsiella aerogenes NOT DETECTED NOT DETECTED Final   Klebsiella oxytoca NOT DETECTED NOT  DETECTED Final   Klebsiella pneumoniae NOT DETECTED NOT DETECTED Final   Proteus species NOT DETECTED NOT DETECTED Final   Salmonella species NOT DETECTED NOT DETECTED Final   Serratia marcescens NOT DETECTED NOT DETECTED Final   Haemophilus influenzae NOT DETECTED NOT DETECTED Final   Neisseria meningitidis NOT DETECTED NOT DETECTED Final   Pseudomonas aeruginosa NOT DETECTED NOT DETECTED Final   Stenotrophomonas maltophilia NOT DETECTED NOT DETECTED Final   Candida albicans NOT DETECTED NOT DETECTED Final   Candida auris NOT DETECTED NOT DETECTED Final   Candida glabrata NOT DETECTED NOT DETECTED Final   Candida krusei NOT DETECTED NOT DETECTED Final   Candida parapsilosis NOT DETECTED NOT DETECTED Final   Candida tropicalis NOT DETECTED NOT DETECTED Final   Cryptococcus neoformans/gattii NOT DETECTED NOT DETECTED Final    Comment: Performed at [facility], [address], [place], [place] [address]  Respiratory (~20 pathogens) panel by PCR     Status: None   Collection Time: 10/07/23  7:47 PM   Specimen: Nasopharyngeal Swab; Respiratory  Result Value Ref Range Status   Adenovirus NOT DETECTED NOT DETECTED Final   Coronavirus 229E NOT DETECTED NOT DETECTED Final    Comment: (NOTE) The Coronavirus on the Respiratory Panel, DOES NOT test for the novel  Coronavirus (2019 nCoV)    Coronavirus HKU1 NOT DETECTED NOT DETECTED Final   Coronavirus NL63 NOT DETECTED NOT DETECTED Final   Coronavirus OC43 NOT DETECTED NOT DETECTED Final   Metapneumovirus NOT DETECTED NOT DETECTED Final   Rhinovirus / Enterovirus NOT DETECTED NOT DETECTED Final   Influenza A NOT DETECTED NOT DETECTED Final   Influenza B NOT DETECTED NOT DETECTED Final   Parainfluenza Virus 1 NOT DETECTED NOT DETECTED Final   Parainfluenza Virus 2 NOT DETECTED NOT DETECTED Final   Parainfluenza Virus 3 NOT DETECTED NOT DETECTED Final   Parainfluenza Virus 4 NOT DETECTED NOT DETECTED Final   Respiratory Syncytial  Virus NOT DETECTED NOT DETECTED Final   Bordetella pertussis NOT DETECTED NOT DETECTED Final   Bordetella Parapertussis NOT DETECTED NOT DETECTED Final   Chlamydophila pneumoniae NOT DETECTED NOT DETECTED Final   Mycoplasma pneumoniae NOT DETECTED NOT DETECTED Final    Comment: Performed at [facility], [address], [place], [place] [address]  Resp panel by RT-PCR (RSV, Flu A&B, Covid) Anterior Nasal Swab     Status: None   Collection Time: 10/07/23  7:47 PM   Specimen: Anterior Nasal Swab  Result Value Ref Range Status   SARS Coronavirus 2 by RT PCR NEGATIVE NEGATIVE Final    Comment: (NOTE) SARS-CoV-2 target nucleic acids are NOT DETECTED.  The SARS-CoV-2 RNA is generally detectable in upper respiratory specimens during the acute phase of infection. The lowest concentration of SARS-CoV-2 viral copies this assay can detect is 138 copies/mL. A negative result does not preclude SARS-Cov-2 infection and should not be used as the sole basis for treatment or other patient management decisions. A negative result may occur with  improper specimen collection/handling, submission of specimen other than nasopharyngeal swab, presence of viral mutation(s) within the areas targeted by this assay, and inadequate number of viral copies(<138 copies/mL). A negative result must be combined with clinical observations, patient history, and epidemiological information. The expected result is Negative.  Fact Sheet for Patients:  BloggerCourse.com  Fact Sheet for Healthcare Providers:  SeriousBroker.it  This test is no t yet approved or cleared by the [place]  FDA and  has been authorized for detection and/or diagnosis of SARS-CoV-2 by FDA under an Emergency Use Authorization (EUA). This EUA will remain  in effect (meaning this test can be used) for the duration of the COVID-19 declaration under Section 564(b)(1) of the Act,  21 U.S.C.section 360bbb-3(b)(1), unless the authorization is terminated  or revoked sooner.       Influenza A by PCR NEGATIVE NEGATIVE Final   Influenza B by PCR NEGATIVE NEGATIVE Final    Comment: (NOTE) The Xpert Xpress SARS-CoV-2/FLU/RSV plus assay is intended as an aid in the diagnosis of influenza from Nasopharyngeal swab specimens and should not be used as a sole basis for treatment. Nasal washings and aspirates are unacceptable for Xpert Xpress SARS-CoV-2/FLU/RSV testing.  Fact Sheet for Patients: BloggerCourse.com  Fact Sheet for Healthcare Providers: SeriousBroker.it  This test is not yet approved or cleared by the [place]  FDA and has been authorized for detection and/or diagnosis of SARS-CoV-2 by FDA under an Emergency Use Authorization (EUA). This EUA will remain in effect (meaning this test can be used) for the duration of the COVID-19 declaration under Section 564(b)(1) of the Act, 21 U.S.C. section 360bbb-3(b)(1), unless the authorization is terminated or revoked.     Resp Syncytial Virus by PCR NEGATIVE NEGATIVE Final    Comment: (NOTE) Fact Sheet for Patients: BloggerCourse.com  Fact Sheet for Healthcare Providers: SeriousBroker.it  This test is not yet approved or cleared by the [place]  FDA and has been authorized for detection and/or diagnosis of SARS-CoV-2 by FDA under an Emergency Use Authorization (EUA). This EUA will remain in effect (meaning this test can be used) for the duration of the COVID-19 declaration under Section 564(b)(1) of the Act, 21 U.S.C. section 360bbb-3(b)(1), unless the authorization is terminated or revoked.  Performed at [facility], [address] [place], [place] [address]   Blood Culture (routine x 2)     Status: None (Preliminary result)   Collection Time: 10/07/23  9:51 PM   Specimen: BLOOD  LEFT ARM  Result Value Ref Range Status   Specimen Description   Final    BLOOD LEFT ARM Performed at [facility], [address] [place], [place] [address]    Special Requests   Final    BOTTLES DRAWN AEROBIC AND ANAEROBIC Blood Culture results may  not be optimal due to an inadequate volume of blood received in culture bottles Performed at [facility], [address], [place], [place] [address]    Culture   Final    NO GROWTH 2 DAYS Performed at [facility], [address] [place], [place] [address]    Report Status PENDING  Incomplete  MRSA Next Gen by PCR, Nasal     Status: None   Collection Time: 10/07/23 10:01 PM   Specimen: Nasal Mucosa; Nasal Swab  Result Value Ref Range Status   MRSA by PCR Next Gen NOT DETECTED NOT DETECTED Final    Comment: (NOTE) The GeneXpert MRSA Assay (FDA approved for NASAL specimens only), is one component of a comprehensive MRSA colonization surveillance program. It is not intended to diagnose MRSA infection nor to guide or monitor treatment for MRSA infections. Test performance is not FDA approved in patients less than 73 years old. Performed at [facility], [address] [place], [place] [address]          Radiology Studies: No results found.      Scheduled Meds:  sodium chloride    Intravenous Once   sodium chloride    Intravenous Once   acyclovir   400 mg Oral Daily   allopurinol   150 mg Oral Daily   Chlorhexidine  Gluconate Cloth  6 each Topical Daily   doxepin   25 mg Oral QHS   gabapentin   100 mg Oral TID   HYDROmorphone   2 mg Oral QHS   insulin  aspart  0-9 Units Subcutaneous Q4H   methocarbamol   500 mg Oral QHS   pantoprazole  (PROTONIX ) IV  40 mg Intravenous Q24H   phytonadione   5 mg Oral Daily   simvastatin   20 mg Oral q1800   Continuous Infusions:   LOS: 3 days    Time spent: 51 minutes spent on 10/10/2023 caring for this patient face-to-face including chart  review, ordering labs/tests, documenting, discussion with nursing staff, consultants, updating family and interview/physical exam    Camellia PARAS [place], DO [facility] Available via Epic secure chat 7am-7pm After these hours, please refer to coverage provider listed on [email or web address] 10/10/2023, 10:10 AM

## 2023-10-10 NOTE — Evaluation (Signed)
 Physical Therapy Evaluation Patient Details Name: Tyler Scott MRN: [id number] DOB: 1955/10/21 Today's Date: 10/10/2023  History of Present Illness  Pt sent from [facility] to ED 2* SOB, weakness and syncope with BP of 79/42.  Pt admitted with severe anemia, thrombocytopenia and hypotension.  Pt is currently undergoing chemo for CLL and has his of DM with diabetic retinopathy.  Clinical Impression  Pt admitted as above and presenting with functional mobility limitations 2* generalized weakness/deconditioning, balance deficits and limited activity tolerance.  Pt hopes to progress to dc home with family assist and will benefit from follow up HHPT to maximize IND and safety at home.        If plan is discharge home, recommend the following: A little help with walking and/or transfers;A little help with bathing/dressing/bathroom;Assistance with cooking/housework;Assist for transportation;Help with stairs or ramp for entrance   Can travel by private vehicle        Equipment Recommendations None recommended by PT  Recommendations for Other Services       Functional Status Assessment Patient has had a recent decline in their functional status and demonstrates the ability to make significant improvements in function in a reasonable and predictable amount of time.     Precautions / Restrictions Precautions Precautions: Fall Restrictions Weight Bearing Restrictions Per Provider Order: No      Mobility  Bed Mobility               General bed mobility comments: Pt up in chair and requests back to same    Transfers Overall transfer level: Needs assistance Equipment used: Rolling walker (2 wheels) Transfers: Sit to/from Stand Sit to Stand: Min assist, +2 physical assistance, +2 safety/equipment           General transfer comment: cues for LE management and use of UEs to self assist; physical assist to bring wt up and fwd and to balance in initial standing     Ambulation/Gait Ambulation/Gait assistance: Min assist, +2 physical assistance, +2 safety/equipment Gait Distance (Feet): 43 Feet Assistive device: Rolling walker (2 wheels) Gait Pattern/deviations: Step-to pattern, Step-through pattern, Decreased step length - right, Decreased step length - left, Shuffle, Trunk flexed Gait velocity: decr     General Gait Details: cues for posture and position from RW; assist for stabilityand RW management; distance ltd by fatigue  Stairs            Wheelchair Mobility     Tilt Bed    Modified Rankin (Stroke Patients Only)       Balance Overall balance assessment: Needs assistance Sitting-balance support: No upper extremity supported, Feet supported Sitting balance-Leahy Scale: Fair     Standing balance support: Bilateral upper extremity supported Standing balance-Leahy Scale: Poor                               Pertinent Vitals/Pain Pain Assessment Pain Assessment: No/denies pain    Home Living Family/patient expects to be discharged to:: Private residence Living Arrangements: Spouse/significant other Available Help at Discharge: Family Type of Home: House Home Access: Stairs to enter Entrance Stairs-Rails: Right Entrance Stairs-Number of Steps: 2 or 4 Alternate Level Stairs-Number of Steps: 14 Home Layout: Two level;1/2 bath on main level Home Equipment: Cane - single point;Crutches;Shower seat;Rollator (4 wheels);Rolling Walker (2 wheels) Additional Comments: reports sleeping on blow up bed on main level    Prior Function Prior Level of Function : Needs assist  Mobility Comments: Pt reports using RW for mobility but has also used wc (states no longer has access to wc) ADLs Comments: reports able to manage ADLs, spouse assists with IADLs and showers, LB dressing as needed     Extremity/Trunk Assessment   Upper Extremity Assessment Upper Extremity Assessment: Generalized weakness     Lower Extremity Assessment Lower Extremity Assessment: Generalized weakness    Cervical / Trunk Assessment Cervical / Trunk Assessment: Normal  Communication   Communication Communication: No apparent difficulties    Cognition Arousal: Alert Behavior During Therapy: WFL for tasks assessed/performed, Flat affect   PT - Cognitive impairments: No apparent impairments                         Following commands: Intact       Cueing Cueing Techniques: Verbal cues     General Comments      Exercises     Assessment/Plan    PT Assessment Patient needs continued PT services  PT Problem List Decreased strength;Decreased activity tolerance;Decreased balance;Decreased mobility;Decreased knowledge of use of DME;Pain       PT Treatment Interventions DME instruction;Gait training;Functional mobility training    PT Goals (Current goals can be found in the Care Plan section)  Acute Rehab PT Goals Patient Stated Goal: REgain iND PT Goal Formulation: With patient Time For Goal Achievement: 10/24/23 Potential to Achieve Goals: Fair    Frequency Min 2X/week     Co-evaluation               AM-PAC PT 6 Clicks Mobility  Outcome Measure Help needed turning from your back to your side while in a flat bed without using bedrails?: A Little Help needed moving from lying on your back to sitting on the side of a flat bed without using bedrails?: A Lot Help needed moving to and from a bed to a chair (including a wheelchair)?: A Lot Help needed standing up from a chair using your arms (e.g., wheelchair or bedside chair)?: A Lot Help needed to walk in hospital room?: A Little Help needed climbing 3-5 steps with a railing? : A Lot 6 Click Score: 14    End of Session Equipment Utilized During Treatment: Gait belt Activity Tolerance: Patient limited by fatigue Patient left: in chair;with call bell/phone within reach;with chair alarm set Nurse Communication: Mobility  status PT Visit Diagnosis: Difficulty in walking, not elsewhere classified (R26.2);Muscle weakness (generalized) (M62.81)    Time: 8591-8566 PT Time Calculation (min) (ACUTE ONLY): 25 min   Charges:   PT Evaluation $PT Eval Low Complexity: 1 Low PT Treatments $Gait Training: 8-22 mins PT General Charges $$ ACUTE PT VISIT: 1 Visit         [facility] PT Acute Rehabilitation Services Office [phone]   Syanna Remmert 10/10/2023, 3:27 PM

## 2023-10-10 NOTE — Progress Notes (Signed)
 OT Cancellation Note  Patient Details Name: Tyler Scott MRN: [id number] DOB: 01/20/1956   Cancelled Treatment:    Reason Eval/Treat Not Completed: Patient at procedure or test/ unavailable (platelets) OT will continue to follow for evaluation as schedule allows  Leita JINNY Odea 10/10/2023, 11:01 AM  Leita DEL OTR/L Acute Rehabilitation Services Office: [phone]

## 2023-10-11 DIAGNOSIS — R579 Shock, unspecified: Secondary | ICD-10-CM | POA: Diagnosis not present

## 2023-10-11 LAB — CBC WITH DIFFERENTIAL/PLATELET
Abs Immature Granulocytes: 0.06 K/uL (ref 0.00–0.07)
Basophils Absolute: 0.1 K/uL (ref 0.0–0.1)
Basophils Relative: 1 %
Eosinophils Absolute: 0.2 K/uL (ref 0.0–0.5)
Eosinophils Relative: 2 %
HCT: 26.7 % — ABNORMAL LOW (ref 39.0–52.0)
Hemoglobin: 8.7 g/dL — ABNORMAL LOW (ref 13.0–17.0)
Immature Granulocytes: 1 %
Lymphocytes Relative: 64 %
Lymphs Abs: 5.7 K/uL — ABNORMAL HIGH (ref 0.7–4.0)
MCH: 30.3 pg (ref 26.0–34.0)
MCHC: 32.6 g/dL (ref 30.0–36.0)
MCV: 93 fL (ref 80.0–100.0)
Monocytes Absolute: 1.8 K/uL — ABNORMAL HIGH (ref 0.1–1.0)
Monocytes Relative: 21 %
Neutro Abs: 1 K/uL — ABNORMAL LOW (ref 1.7–7.7)
Neutrophils Relative %: 11 %
Platelets: 18 K/uL — CL (ref 150–400)
RBC: 2.87 MIL/uL — ABNORMAL LOW (ref 4.22–5.81)
RDW: 18.1 % — ABNORMAL HIGH (ref 11.5–15.5)
WBC: 8.7 K/uL (ref 4.0–10.5)
nRBC: 0.2 % (ref 0.0–0.2)

## 2023-10-11 LAB — BASIC METABOLIC PANEL WITH GFR
Anion gap: 11 (ref 5–15)
BUN: 14 mg/dL (ref 8–23)
CO2: 23 mmol/L (ref 22–32)
Calcium: 7.2 mg/dL — ABNORMAL LOW (ref 8.9–10.3)
Chloride: 105 mmol/L (ref 98–111)
Creatinine, Ser: 0.76 mg/dL (ref 0.61–1.24)
GFR, Estimated: >60 mL/min (ref >60)
Glucose, Bld: 101 mg/dL — ABNORMAL HIGH (ref 70–99)
Potassium: 3.3 mmol/L — ABNORMAL LOW (ref 3.5–5.1)
Sodium: 138 mmol/L (ref 135–145)

## 2023-10-11 LAB — URIC ACID: Uric Acid, Serum: <1.5 mg/dL — ABNORMAL LOW (ref 3.7–8.6)

## 2023-10-11 LAB — GLUCOSE, CAPILLARY
Glucose-Capillary: 101 mg/dL — ABNORMAL HIGH (ref 70–99)
Glucose-Capillary: 106 mg/dL — ABNORMAL HIGH (ref 70–99)
Glucose-Capillary: 87 mg/dL (ref 70–99)
Glucose-Capillary: 92 mg/dL (ref 70–99)
Glucose-Capillary: 95 mg/dL (ref 70–99)

## 2023-10-11 LAB — TYPE AND SCREEN
ABO/RH(D): A POS
Antibody Screen: NEGATIVE

## 2023-10-11 MED ORDER — SODIUM CHLORIDE 0.9% IV SOLUTION: Freq: Once | INTRAVENOUS | Status: AC

## 2023-10-11 MED ORDER — POTASSIUM CHLORIDE CRYS ER 10 MEQ PO TBCR
30.0000 meq | EXTENDED_RELEASE_TABLET | ORAL | Status: AC
  Administered 2023-10-11 (×2): 30 meq via ORAL
  Filled 2023-10-11 (×2): qty 1

## 2023-10-11 NOTE — Progress Notes (Signed)
 eLink Physician-Brief Progress Note Patient Name: Tyler Scott DOB: 1955/11/26 MRN: [id number]   Date of Service  10/11/2023  HPI/Events of Note  Plt 18k; transfuse for counts <20K or <50K with bleeding   eICU Interventions  Transfuse platelets      Intervention Category Intermediate Interventions: Bleeding - evaluation and treatment with blood products  Jadie Comas 10/11/2023, 5:34 AM

## 2023-10-11 NOTE — Progress Notes (Signed)
 PROGRESS NOTE    [name]  FMW:[id number] DOB: 07/05/1955 DOA: 10/07/2023 PCP: [facility]    Brief Narrative:   Tyler Scott is a 68 y.o. male with past medical history significant for CLL with recent initiation of chemotherapy, HTN, HLD, DM 1, chronic ITP (baseline Plt 40's) who presented to [facility] ED from the cancer center with complaints of abdominal pain, nausea, poor oral intake, generalized weakness with near syncope and shortness of breath.  Patient was due for next chemotherapy infusion today, 8/28 and when attempting to get to the car had an episode of near syncope with associated shortness of breath and generalized weakness.  EMS was activated in which she was noted to be tachycardic, hypotensive with mild AMS (improved shortly after arrival).  Patient's wife also noted hyperglycemia to the 300s the morning of 10/07/2023 in which she administered insulin .  On ED arrival, patient was afebrile with HR 116, BP 95/67, RR 25, SpO2 94%. Labs were notable for WBC 14.6 (down from 17.1 8/27 with ANC 1100), Hgb 1.7 (8.2), Plt 10 (47). INR > 10 (1 two months PTA). CMP/lipase pending. Trop 46. LA 2.5 > 5.4. VBG 7.21/pCO2 < 18/pO2 < 31/bicarb 5.6. Initial CBG 40. CXR demonstrated small L pleural effusion and L lung base atelectasis with R paratracheal adenoapthyp; CT A/P showed extensive bulky LAD throughout the lower chest, abdomen, pelvis and groin regions (similar from prior), splenic enlargement with peripheral splenic infarct, small lucent lesions in the pelvis c/f lymphomatous involvement, moderate L and small R pleural effusions with atelectasis. Broad-spectrum antibiotics were started, Cx pending. Levophed  started for hypotension. Oncology consulted. PCCM consulted for ICU admission.  Significant hospital events: 8/28: Admit ICU, started on vasopressors, oncology consulted; transfused 2 unit PRBC 8/29: Levophed  titrated off; received rasburicase , 1 unit platelets, 1  unit PRBC transfusion 8/30: 1 unit platelet transfusion 8/31: Transferred to [facility]; 2 unit platelet transfusion 9/1: 1u platelet transfusion, transfer to 6 E.  Assessment & Plan:   Shock, undifferentiated Concern for sepsis, ruled out Patient presenting with weakness and noted to be hypotensive, initially admitted to the intensive care unit on vasopressors.  Suspect etiology likely secondary to severe dehydration, poor oral intake with recent chemotherapy infusion as well as tumor lysis syndrome as above.  Initially was started on empiric antibiotics with vancomycin , cefepime  and doxycycline  which were stopped.  Acute hypoxemic respiratory failure: Resolved Supplemental oxygen weaned off.  CLL/SLL/low-grade lymphoma Tumor lysis syndrome Patient initially diagnosed with CLL 2012. Most recently patient received Cycle 1 Bendamustine  (without Rituxan ) on 09/08/23.  Imaging 8/29 with extensive bulky lymphadenopathy throughout the lower chest, abdomen, pelvis and groin similar to previous study also with splenic enlargement, noted bone lesions progressive since prior study.  Uric acid 12.3, received rasburicase  on 8/28. -- Uric acid 12.3>5.1..<1.5 -- Further per oncology  Acute renal failure: Resolved Suspect etiology multifactorial with ATN from hypotension/shock as well as prerenal azotemia from severe dehydration.  No hydronephrosis noted on CT abdomen/pelvis at time of admission.  IV fluids now discontinued. -- Ct 2.48>2.36>>1.58>0.92>0.76 -- BMP in am  Anemia, severe Hemoglobin on arrival 1.7; transfused 3 unit PRBCs. -- Hgb 1.7>9.3>10.0>8.7 -- Transfuse for hemoglobin less than 7.0 -- CBC daily  Thrombocytopenia, severe Platelet count 10K arrival.  Received 1 unit platelet on 8/29, 8/30, 2 u on 8/31 -- Plt 10>15>18>>14>18>15>27>18 -- Transfuse 1 unit platelets today -- Repeat CBC 2 hours following transfusion -- transfuse for counts <20K or <50K with bleeding  -- CBC with diff  daily  Hypokalemia Repleted.  Potassium 3.3 today, will replete -- Repeat electrolytes in a.m. to go magnesium   HTN Patient was notably hypotensive/shock as above at time of arrival. -- BP 130/59, stable -- Continue monitor BP closely  HLD -- Zocor  20 mg p.o. daily  Type 1 diabetes mellitus On Jardiance  12.5 mg p.o. daily, Lantus  10-30 units [place]oma Prn at bedtime at baseline -- Sensitive SSI for coverage  Chronic pain Patient reportedly on MS Contin  15 mg p.o. nightly, gabapentin  100 mg p.o. 3 times daily Dilaudid  2 mg p.o. nightly Robaxin  500 mg p.o. nightly -- Hold home MS Contin  -- Continue gabapentin  100 mg PO TID -- Continue dialudid 2 mg PO at bedtime -- Continue Robaxin  500 mg PO qHS  Weakness/debility/deconditioning/gait disturbance: -- PT/OT evaluation: Pending   DVT prophylaxis: SCDs Start: 10/07/23 1835    Code Status: Full Code Family Communication: No family present bedside this morning  Disposition Plan:  Level of care: Telemetry Status is: Inpatient Remains inpatient appropriate because: Platelet transfusion, pending therapy evaluation; transfer out of stepdown unit today    Consultants:  PCCM - singed off 8/31 Medical Oncology  Procedures:  none  Antimicrobials:  Cefepime  8/28 - 8/28 Vancomycin  8/28 - 8/28 Doxycycline  8/28 - 8/30   Subjective: Patient seen examined bedside, lying in bed.  No complaints this morning.  Platelet count dropped from 27-18 overnight, receiving 1 additional platelet units today.  Stable to transfer to 6 E.  Awaiting therapy evaluation. No other questions, concerns or complaints at this time.  Denies headache, no dizziness, no chest pain, no palpitations, no shortness of breath, no abdominal pain, no fever/chills/night sweats, no nausea/vomiting.  No acute concerns overnight per nursing staff.  Objective: Vitals:   10/11/23 0740 10/11/23 0800 10/11/23 0813 10/11/23 0900  BP:  (!) 147/54  (!) 148/102  Pulse:  90  98   Resp:  (!) 23  (!) 23  Temp: 98.5 F (36.9 C)  98.5 F (36.9 C)   TempSrc: Oral  Oral   SpO2:  96%  90%  Weight:      Height:        Intake/Output Summary (Last 24 hours) at 10/11/2023 1036 Last data filed at 10/11/2023 0900 Gross per 24 hour  Intake 882.15 ml  Output 1075 ml  Net -192.85 ml   Filed Weights   10/09/23 0500 10/10/23 0700 10/11/23 0500  Weight: 60 kg 60 kg 63.3 kg    Examination:  Physical Exam: GEN: NAD, alert and oriented x 3, ill in appearance HEENT: NCAT, PERRL, EOMI, sclera clear, MMM PULM: CTAB w/o wheezes/crackles, normal respiratory effort, room air CV: RRR w/o M/G/R GI: abd soft, NTND, + BS MSK: no peripheral edema, moves all extremity independently NEURO: No focal neurological deficit PSYCH: normal mood/affect Integumentary: No concerning rashes/lesions/wounds noted on exposed skin surfaces    Data Reviewed: I have personally reviewed following labs and imaging studies  CBC: Recent Labs  Lab 10/09/23 0315 10/09/23 1609 10/10/23 0345 10/10/23 1447 10/11/23 0330  WBC 10.3 11.8* 8.4 14.1* 8.7  NEUTROABS 1.4* 1.7 1.2* 1.8 1.0*  HGB 9.3* 10.0* 8.7* 10.4* 8.7*  HCT 28.4* 30.7* 27.3* 33.4* 26.7*  MCV 90.4 90.8 92.2 93.3 93.0  PLT 14* 18* 15* 27* 18*   Basic Metabolic Panel: Recent Labs  Lab 10/07/23 2151 10/07/23 2341 10/08/23 0725 10/08/23 1349 10/09/23 0315 10/10/23 0345 10/11/23 0330  NA 134* 132* 135  --  138 137 138  K 6.4* 7.3* 4.8 4.5 4.1 3.6  3.3*  CL 97* 99 98  --  104 106 105  CO2 21* 18* 21*  --  20* 21* 23  GLUCOSE 45* 51* 76  --  84 117* 101*  BUN 42* 44* 46*  --  36* 20 14  CREATININE 2.48* 2.36* 2.31*  --  1.58* 0.92 0.76  CALCIUM  6.1* 5.9* 5.8*  --  6.1* 7.0* 7.2*  MG 2.4 2.3 2.4  --   --   --   --   PHOS 8.3* 7.7* 7.5*  --   --   --   --    GFR: Estimated Creatinine Clearance: 80.2 mL/min (by C-G formula based on SCr of 0.76 mg/dL). Liver Function Tests: Recent Labs  Lab 10/07/23 1539 10/07/23 2151  10/07/23 2341 10/08/23 0725 10/10/23 0345  AST 16 132* 130* 118* 21  ALT <5 36 36 43 20  ALKPHOS 27* 160* 148* 131* 109  BILITOT <0.2 1.3* 1.1 1.2 1.2  PROT <3.0* 5.4* 4.9* 4.7* 4.5*  ALBUMIN <1.5* 3.4* 3.1* 2.9* 2.9*   Recent Labs  Lab 10/07/23 1539  LIPASE <10*   No results for input(s): AMMONIA in the last 168 hours. Coagulation Profile: Recent Labs  Lab 10/07/23 1539 10/07/23 2151 10/07/23 2341 10/08/23 0725 10/08/23 1027  INR >10.0* 2.1* 2.1* 2.0* 1.9*   Cardiac Enzymes: No results for input(s): CKTOTAL, CKMB, CKMBINDEX, TROPONINI in the last 168 hours. BNP (last 3 results) No results for input(s): PROBNP in the last 8760 hours. HbA1C: No results for input(s): HGBA1C in the last 72 hours. CBG: Recent Labs  Lab 10/10/23 1628 10/10/23 2006 10/10/23 2334 10/11/23 0408 10/11/23 0811  GLUCAP 107* 104* 111* 101* 87   Lipid Profile: No results for input(s): CHOL, HDL, LDLCALC, TRIG, CHOLHDL, LDLDIRECT in the last 72 hours. Thyroid  Function Tests: No results for input(s): TSH, T4TOTAL, FREET4, T3FREE, THYROIDAB in the last 72 hours. Anemia Panel: No results for input(s): VITAMINB12, FOLATE, FERRITIN, TIBC, IRON, RETICCTPCT in the last 72 hours. Sepsis Labs: Recent Labs  Lab 10/07/23 1541 10/07/23 1751 10/07/23 2341  LATICACIDVEN 2.5* 5.4* 2.3*    Recent Results (from the past 240 hours)  Blood Culture (routine x 2)     Status: Abnormal   Collection Time: 10/07/23  3:39 PM   Specimen: BLOOD RIGHT ARM  Result Value Ref Range Status   Specimen Description   Final    BLOOD RIGHT ARM Performed at [facility], [address], [place], [place] [address]    Special Requests   Final    BOTTLES DRAWN AEROBIC ONLY Blood Culture results may not be optimal due to an inadequate volume of blood received in culture bottles Performed at [facility], [address] [place], [place] [address]     Culture  Setup Time   Final    GRAM POSITIVE COCCI IN CLUSTERS AEROBIC BOTTLE ONLY CRITICAL RESULT CALLED TO, READ BACK BY AND VERIFIED WITH: PHARMD CHRISTINE SHADE ON 10/08/23 @ 1501 BY DRT    Culture (A)  Final    STAPHYLOCOCCUS HOMINIS THE SIGNIFICANCE OF ISOLATING THIS ORGANISM FROM A SINGLE SET OF BLOOD CULTURES WHEN MULTIPLE SETS ARE DRAWN IS UNCERTAIN. PLEASE NOTIFY THE MICROBIOLOGY DEPARTMENT WITHIN ONE WEEK IF SPECIATION AND SENSITIVITIES ARE REQUIRED. Performed at [facility], [address], [place], [place] [address]    Report Status 10/10/2023 FINAL  Final  Blood Culture ID Panel (Reflexed)     Status: Abnormal   Collection Time: 10/07/23  3:39  PM  Result Value Ref Range Status   Enterococcus faecalis NOT DETECTED NOT DETECTED Final   Enterococcus Faecium NOT DETECTED NOT DETECTED Final   Listeria monocytogenes NOT DETECTED NOT DETECTED Final   Staphylococcus species DETECTED (A) NOT DETECTED Final    Comment: CRITICAL RESULT CALLED TO, READ BACK BY AND VERIFIED WITH: PHARMD CHRISTINE SHADE ON 10/08/23 @ 1501 BY DRT    Staphylococcus aureus (BCID) NOT DETECTED NOT DETECTED Final   Staphylococcus epidermidis NOT DETECTED NOT DETECTED Final   Staphylococcus lugdunensis NOT DETECTED NOT DETECTED Final   Streptococcus species NOT DETECTED NOT DETECTED Final   Streptococcus agalactiae NOT DETECTED NOT DETECTED Final   Streptococcus pneumoniae NOT DETECTED NOT DETECTED Final   Streptococcus pyogenes NOT DETECTED NOT DETECTED Final   A.calcoaceticus-baumannii NOT DETECTED NOT DETECTED Final   Bacteroides fragilis NOT DETECTED NOT DETECTED Final   Enterobacterales NOT DETECTED NOT DETECTED Final   Enterobacter cloacae complex NOT DETECTED NOT DETECTED Final   Escherichia coli NOT DETECTED NOT DETECTED Final   Klebsiella aerogenes NOT DETECTED NOT DETECTED Final   Klebsiella oxytoca NOT DETECTED NOT DETECTED Final   Klebsiella pneumoniae NOT DETECTED NOT DETECTED Final    Proteus species NOT DETECTED NOT DETECTED Final   Salmonella species NOT DETECTED NOT DETECTED Final   Serratia marcescens NOT DETECTED NOT DETECTED Final   Haemophilus influenzae NOT DETECTED NOT DETECTED Final   Neisseria meningitidis NOT DETECTED NOT DETECTED Final   Pseudomonas aeruginosa NOT DETECTED NOT DETECTED Final   Stenotrophomonas maltophilia NOT DETECTED NOT DETECTED Final   Candida albicans NOT DETECTED NOT DETECTED Final   Candida auris NOT DETECTED NOT DETECTED Final   Candida glabrata NOT DETECTED NOT DETECTED Final   Candida krusei NOT DETECTED NOT DETECTED Final   Candida parapsilosis NOT DETECTED NOT DETECTED Final   Candida tropicalis NOT DETECTED NOT DETECTED Final   Cryptococcus neoformans/gattii NOT DETECTED NOT DETECTED Final    Comment: Performed at [facility], [address], [place], [place] [address]  Respiratory (~20 pathogens) panel by PCR     Status: None   Collection Time: 10/07/23  7:47 PM   Specimen: Nasopharyngeal Swab; Respiratory  Result Value Ref Range Status   Adenovirus NOT DETECTED NOT DETECTED Final   Coronavirus 229E NOT DETECTED NOT DETECTED Final    Comment: (NOTE) The Coronavirus on the Respiratory Panel, DOES NOT test for the novel  Coronavirus (2019 nCoV)    Coronavirus HKU1 NOT DETECTED NOT DETECTED Final   Coronavirus NL63 NOT DETECTED NOT DETECTED Final   Coronavirus OC43 NOT DETECTED NOT DETECTED Final   Metapneumovirus NOT DETECTED NOT DETECTED Final   Rhinovirus / Enterovirus NOT DETECTED NOT DETECTED Final   Influenza A NOT DETECTED NOT DETECTED Final   Influenza B NOT DETECTED NOT DETECTED Final   Parainfluenza Virus 1 NOT DETECTED NOT DETECTED Final   Parainfluenza Virus 2 NOT DETECTED NOT DETECTED Final   Parainfluenza Virus 3 NOT DETECTED NOT DETECTED Final   Parainfluenza Virus 4 NOT DETECTED NOT DETECTED Final   Respiratory Syncytial Virus NOT DETECTED NOT DETECTED Final   Bordetella pertussis NOT DETECTED  NOT DETECTED Final   Bordetella Parapertussis NOT DETECTED NOT DETECTED Final   Chlamydophila pneumoniae NOT DETECTED NOT DETECTED Final   Mycoplasma pneumoniae NOT DETECTED NOT DETECTED Final    Comment: Performed at [facility], [address], [place], [place] [address]  Resp panel by RT-PCR (RSV, Flu A&B, Covid) Anterior Nasal Swab  Status: None   Collection Time: 10/07/23  7:47 PM   Specimen: Anterior Nasal Swab  Result Value Ref Range Status   SARS Coronavirus 2 by RT PCR NEGATIVE NEGATIVE Final    Comment: (NOTE) SARS-CoV-2 target nucleic acids are NOT DETECTED.  The SARS-CoV-2 RNA is generally detectable in upper respiratory specimens during the acute phase of infection. The lowest concentration of SARS-CoV-2 viral copies this assay can detect is 138 copies/mL. A negative result does not preclude SARS-Cov-2 infection and should not be used as the sole basis for treatment or other patient management decisions. A negative result may occur with  improper specimen collection/handling, submission of specimen other than nasopharyngeal swab, presence of viral mutation(s) within the areas targeted by this assay, and inadequate number of viral copies(<138 copies/mL). A negative result must be combined with clinical observations, patient history, and epidemiological information. The expected result is Negative.  Fact Sheet for Patients:  BloggerCourse.com  Fact Sheet for Healthcare Providers:  SeriousBroker.it  This test is no t yet approved or cleared by the [place]  FDA and  has been authorized for detection and/or diagnosis of SARS-CoV-2 by FDA under an Emergency Use Authorization (EUA). This EUA will remain  in effect (meaning this test can be used) for the duration of the COVID-19 declaration under Section 564(b)(1) of the Act, 21 U.S.C.section 360bbb-3(b)(1), unless the authorization is terminated  or revoked  sooner.       Influenza A by PCR NEGATIVE NEGATIVE Final   Influenza B by PCR NEGATIVE NEGATIVE Final    Comment: (NOTE) The Xpert Xpress SARS-CoV-2/FLU/RSV plus assay is intended as an aid in the diagnosis of influenza from Nasopharyngeal swab specimens and should not be used as a sole basis for treatment. Nasal washings and aspirates are unacceptable for Xpert Xpress SARS-CoV-2/FLU/RSV testing.  Fact Sheet for Patients: BloggerCourse.com  Fact Sheet for Healthcare Providers: SeriousBroker.it  This test is not yet approved or cleared by the [place]  FDA and has been authorized for detection and/or diagnosis of SARS-CoV-2 by FDA under an Emergency Use Authorization (EUA). This EUA will remain in effect (meaning this test can be used) for the duration of the COVID-19 declaration under Section 564(b)(1) of the Act, 21 U.S.C. section 360bbb-3(b)(1), unless the authorization is terminated or revoked.     Resp Syncytial Virus by PCR NEGATIVE NEGATIVE Final    Comment: (NOTE) Fact Sheet for Patients: BloggerCourse.com  Fact Sheet for Healthcare Providers: SeriousBroker.it  This test is not yet approved or cleared by the [place]  FDA and has been authorized for detection and/or diagnosis of SARS-CoV-2 by FDA under an Emergency Use Authorization (EUA). This EUA will remain in effect (meaning this test can be used) for the duration of the COVID-19 declaration under Section 564(b)(1) of the Act, 21 U.S.C. section 360bbb-3(b)(1), unless the authorization is terminated or revoked.  Performed at [facility], [address] [place], [place] [address]   Blood Culture (routine x 2)     Status: None (Preliminary result)   Collection Time: 10/07/23  9:51 PM   Specimen: BLOOD LEFT ARM  Result Value Ref Range Status   Specimen Description   Final    BLOOD  LEFT ARM Performed at [facility], [address], [place], [place] [address]    Special Requests   Final    BOTTLES DRAWN AEROBIC AND ANAEROBIC Blood Culture results may not be optimal due to an inadequate volume of blood received in culture bottles Performed  at [facility], [address] [place], [place] [address]    Culture   Final    NO GROWTH 3 DAYS Performed at [facility], [address] [place], [place] [address]    Report Status PENDING  Incomplete  MRSA Next Gen by PCR, Nasal     Status: None   Collection Time: 10/07/23 10:01 PM   Specimen: Nasal Mucosa; Nasal Swab  Result Value Ref Range Status   MRSA by PCR Next Gen NOT DETECTED NOT DETECTED Final    Comment: (NOTE) The GeneXpert MRSA Assay (FDA approved for NASAL specimens only), is one component of a comprehensive MRSA colonization surveillance program. It is not intended to diagnose MRSA infection nor to guide or monitor treatment for MRSA infections. Test performance is not FDA approved in patients less than 68 years old. Performed at [facility], [address], [place], [place] [address]          Radiology Studies: No results found.      Scheduled Meds:  sodium chloride    Intravenous Once   acyclovir   400 mg Oral Daily   allopurinol   150 mg Oral Daily   Chlorhexidine  Gluconate Cloth  6 each Topical Daily   doxepin   25 mg Oral QHS   gabapentin   100 mg Oral TID   HYDROmorphone   2 mg Oral QHS   insulin  aspart  0-9 Units Subcutaneous Q4H   methocarbamol   500 mg Oral QHS   pantoprazole  (PROTONIX ) IV  40 mg Intravenous Q24H   phytonadione   5 mg Oral Daily   potassium chloride   30 mEq Oral Q3H   simvastatin   20 mg Oral q1800   Continuous Infusions:   LOS: 4 days    Time spent: 51 minutes spent on 10/11/2023 caring for this patient face-to-face including chart review, ordering labs/tests, documenting, discussion with nursing staff, consultants,  updating family and interview/physical exam    Camellia PARAS [place], DO [facility] Available via Epic secure chat 7am-7pm After these hours, please refer to coverage provider listed on [email or web address] 10/11/2023, 10:36 AM

## 2023-10-11 NOTE — TOC Initial Note (Signed)
 Transition of Care [facility]) - Initial/Assessment Note    Patient Details  Name: Tyler Scott MRN: [id number] Date of Birth: May 20, 1955  Transition of Care [facility]) CM/SW Contact:    Heather DELENA Saltness, LCSW Phone Number: 10/11/2023, 2:23 PM  Clinical Narrative:                 Pt recommended for [facility] PT services upon discharge. Pt currently receiving HH PT/OT services through [facility]. CSW spoke with Jon from [facility], who reports ability to continue services upon discharge. Will need ROC orders at discharge. TOC will continue to follow.    Expected Discharge Plan: Home w Home Health Services Barriers to Discharge: Continued Medical Work up   Patient Goals and CMS Choice Patient states their goals for this hospitalization and ongoing recovery are:: To return home      Expected Discharge Plan and Services In-house Referral: Clinical Social Work Discharge Planning Services: NA Post Acute Care Choice: Home Health Living arrangements for the past 2 months: Single Family Home                 DME Arranged: N/A DME Agency: NA       HH Arranged: NA HH Agency: Other - See comment Producer, television/film/video) Date HH Agency Contacted: 10/11/23 Time HH Agency Contacted: 1422 Representative spoke with at [facility] Agency: Jon  Prior Living Arrangements/Services Living arrangements for the past 2 months: Single Family Home Lives with:: Self, Spouse Patient language and need for interpreter reviewed:: Yes Do you feel safe going back to the place where you live?: Yes      Need for Family Participation in Patient Care: Yes (Comment) Care giver support system in place?: Yes (comment) Current home services: Home OT, Home PT Criminal Activity/Legal Involvement Pertinent to Current Situation/Hospitalization: No - Comment as needed  Activities of Daily Living   ADL Screening (condition at time of admission) Independently performs ADLs?: No Does the patient have a NEW difficulty with  bathing/dressing/toileting/self-feeding that is expected to last >3 days?: No Does the patient have a NEW difficulty with getting in/out of bed, walking, or climbing stairs that is expected to last >3 days?: No Does the patient have a NEW difficulty with communication that is expected to last >3 days?: No Is the patient deaf or have difficulty hearing?: No Does the patient have difficulty seeing, even when wearing glasses/contacts?: No Does the patient have difficulty concentrating, remembering, or making decisions?: Yes  Permission Sought/Granted Permission sought to share information with : Case Manager, Family Supports Permission granted to share information with : Yes, Verbal Permission Granted  Share Information with NAME: Jerimyah Vandunk  Permission granted to share info w AGENCY: [facility]  Permission granted to share info w Relationship: Spouse  Permission granted to share info w Contact Information: [phone]  Emotional Assessment Appearance::  (UTA) Attitude/Demeanor/Rapport: Unable to Assess Affect (typically observed): Unable to Assess Orientation: : Oriented to Self, Oriented to Place, Oriented to  Time, Oriented to Situation, Fluctuating Orientation (Suspected and/or reported Sundowners) Alcohol  / Substance Use: Not Applicable Psych Involvement: No (comment)  Admission diagnosis:  Shock (HCC) [R57.9] Thrombocytopenia (HCC) [D69.6] Splenic sequestration crisis [D73.89] Symptomatic anemia [D64.9] Hypotension, unspecified hypotension type [I95.9] Patient Active Problem List   Diagnosis Date Noted   Vitamin K deficiency 10/08/2023   Tumor lysis syndrome 10/08/2023   Symptomatic anemia 10/08/2023   Pressure injury of skin 10/08/2023   Shock (HCC) 10/07/2023   Thrombocytopenia (HCC) 09/01/2023   Anemia 09/01/2023   Need for emotional  support 08/29/2023   Palliative care encounter 08/28/2023   High risk medication use 08/28/2023   Medication management 08/28/2023    Counseling and coordination of care 08/28/2023   Cancer associated pain 08/28/2023   Cancer-related breakthrough pain 08/27/2023   Normocytic anemia 08/27/2023   Lymphoma, low grade (HCC) 08/27/2023   Hyponatremia 08/27/2023   Chronic ITP (idiopathic thrombocytopenia) (HCC) 08/11/2023   Insulin  dependent type 2 diabetes mellitus (HCC) 08/06/2023   AKI (acute kidney injury) (HCC) 08/06/2023   Pancytopenia (HCC) 08/06/2023   Controlled type 2 diabetes mellitus with retinopathy, with long-term current use of insulin  (HCC) 09/14/2016   Hx of adenomatous polyp of colon 05/24/2014   Family history of colon cancer - brother 87 05/18/2014   Hyperkalemia 01/21/2013   Splenomegaly 01/21/2013   CLL (chronic lymphocytic leukemia) (HCC)    Type 1 diabetes mellitus (HCC) 12/29/2007   Hyperlipidemia 12/30/2006   Essential hypertension 12/30/2006   ERECTILE DYSFUNCTION, ORGANIC 12/30/2006   LOW BACK PAIN, ACUTE 12/21/2006   PCP:  Clinic, [facility] Pharmacy:   [facility] [phone] - [place], [place] - [address] AT [address] OF [facility] [address] [place] [address] Phone: [phone] Fax: [phone]  [facility] - [facility] [address] [place] [address] Phone: [phone] Fax: [phone]  [facility] - [place], [place] - [phone] [place] [address] Phone: [phone] Fax: [phone]     Social Drivers of Health (SDOH) Social History: SDOH Screenings   Food Insecurity: No Food Insecurity (10/08/2023)  Housing: Low Risk  (10/08/2023)  Transportation Needs: No Transportation Needs (10/08/2023)  Utilities: Not At Risk (10/08/2023)  Depression (PHQ2-9): Low Risk  (09/14/2023)  Social Connections: Unknown (10/08/2023)  Recent Concern: Social Connections - Moderately Isolated (10/08/2023)  Tobacco Use: Low Risk  (10/07/2023)   SDOH Interventions: None indicated     Readmission Risk  Interventions    10/11/2023    2:19 PM 08/30/2023   11:40 AM 08/07/2023    8:49 AM  Readmission Risk Prevention Plan  Transportation Screening Complete Complete Complete  PCP or Specialist Appt within 5-7 Days   Complete  PCP or Specialist Appt within 3-5 Days  Complete   Home Care Screening   Complete  Medication Review (RN CM)   Complete  HRI or Home Care Consult  Complete   Social Work Consult for Recovery Care Planning/Counseling  Complete   Palliative Care Screening  Complete   Medication Review Oceanographer) Complete Complete   PCP or Specialist appointment within 3-5 days of discharge Complete    HRI or Home Care Consult Complete    SW Recovery Care/Counseling Consult Complete    Palliative Care Screening Not Applicable    Skilled Nursing Facility Not Applicable      Signed: Heather Saltness, MSW, LCSW Clinical Social Worker Inpatient Care Management 10/11/2023 2:26 PM

## 2023-10-11 NOTE — Evaluation (Signed)
 Occupational Therapy Evaluation Patient Details Name: Tyler Scott MRN: [id number] DOB: 08/27/1955 Today's Date: 10/11/2023   History of Present Illness   Pt sent from Cancer center to ED 2* SOB, weakness and syncope with BP of 79/42 with fall off garage steps into car with spouse.  Pt admitted with severe anemia, thrombocytopenia and hypotension.  Pt is currently undergoing chemo for CLL and has his of DM with diabetic retinopathy.     Clinical Impressions PTA, patient lives at home with wife who was assisting patient since multiple hospitalizations the past few months with mobility with AD, some higher level ADL's and all IADL's.  Currently, patient presents with deficits outlined below (see OT Problem List for details) most significantly pain, generalized muscle weakness, new onset higher level cognitive deficits, decreased activity tolerance and balance. Patient and wife feel at current level, home services would be a challenge unless significant progress made prior to d/c. Patient requires continued Acute care hospital level OT services to progress safety and functional performance and allow for discharge.  Patient will benefit from continued inpatient follow up therapy, <3 hours/day.        If plan is discharge home, recommend the following:   Two people to help with walking and/or transfers;A lot of help with bathing/dressing/bathroom;Assistance with cooking/housework;Direct supervision/assist for medications management;Direct supervision/assist for financial management;Assist for transportation;Help with stairs or ramp for entrance;Supervision due to cognitive status     Functional Status Assessment   Patient has had a recent decline in their functional status and demonstrates the ability to make significant improvements in function in a reasonable and predictable amount of time.     Equipment Recommendations   None recommended by OT       Precautions/Restrictions   Precautions Precautions: Fall Restrictions Weight Bearing Restrictions Per Provider Order: No     Mobility Bed Mobility Overal bed mobility: Needs Assistance Bed Mobility: Supine to Sit, Sit to Supine     Supine to sit: Contact guard, HOB elevated, Used rails Sit to supine: Contact guard assist, HOB elevated, Used rails   General bed mobility comments: min cues for position changes    Transfers Overall transfer level: Needs assistance Equipment used: Rolling walker (2 wheels) Transfers: Sit to/from Stand, Bed to chair/wheelchair/BSC Sit to Stand: Min assist, Mod assist, From elevated surface     Step pivot transfers: Mod assist, From elevated surface     General transfer comment: increased time due to fatigue and weakness      Balance Overall balance assessment: Needs assistance Sitting-balance support: No upper extremity supported, Feet supported Sitting balance-Leahy Scale: Fair     Standing balance support: Bilateral upper extremity supported Standing balance-Leahy Scale: Poor Standing balance comment: poor standing tolerance and heavy reliance on RW                           ADL either performed or assessed with clinical judgement   ADL Overall ADL's : Needs assistance/impaired Eating/Feeding: Set up   Grooming: Wash/dry hands;Wash/dry face;Oral care;Sitting;Set up   Upper Body Bathing: Contact guard assist;Sitting   Lower Body Bathing: Maximal assistance;Sit to/from stand;Cueing for sequencing   Upper Body Dressing : Minimal assistance;Sitting   Lower Body Dressing: Maximal assistance;Sit to/from stand   Toilet Transfer: Moderate assistance;+2 for physical assistance;+2 for safety/equipment;Rolling walker (2 wheels);BSC/3in1   Toileting- Clothing Manipulation and Hygiene: Maximal assistance;Sitting/lateral lean Toileting - Clothing Manipulation Details (indicate cue type and reason): has Foley for bladder  management     Functional mobility during ADLs: Moderate assistance;Rolling walker (2 wheels) General ADL Comments: poor activity tolerance for all tasks this session     Vision Baseline Vision/History: 0 No visual deficits              Pertinent Vitals/Pain Pain Assessment Pain Assessment: Faces Faces Pain Scale: Hurts whole lot Pain Location: back Pain Descriptors / Indicators: Aching Pain Intervention(s): Limited activity within patient's tolerance, Monitored during session, Repositioned, Premedicated before session, Heat applied     Extremity/Trunk Assessment Upper Extremity Assessment Upper Extremity Assessment: Right hand dominant;Generalized weakness   Lower Extremity Assessment Lower Extremity Assessment: Generalized weakness   Cervical / Trunk Assessment Cervical / Trunk Assessment: Normal   Communication Communication Communication: No apparent difficulties   Cognition Arousal: Alert Behavior During Therapy: WFL for tasks assessed/performed, Flat affect Cognition: Cognition impaired   Orientation impairments: Situation Awareness: Intellectual awareness impaired, Online awareness impaired Memory impairment (select all impairments): Short-term memory Attention impairment (select first level of impairment): Selective attention Executive functioning impairment (select all impairments): Reasoning, Problem solving OT - Cognition Comments: spouse and patient report some STM due to pain meds and lack of sleep                 Following commands: Intact       Cueing  General Comments   Cueing Techniques: Verbal cues  fatigue this session, awaiting potential blood products as per chart review, wife bedside and highly concerned for tolerance for home d/c vs rehab, unable to tolerate OOB to recliner but did side step up to [facility]           Home Living Family/patient expects to be discharged to:: Private residence Living Arrangements: Spouse/significant  other Available Help at Discharge: Family Type of Home: House Home Access: Stairs to enter Secretary/administrator of Steps: 2 or 4 Entrance Stairs-Rails: Right Home Layout: Two level;1/2 bath on main level Alternate Level Stairs-Number of Steps: 14   Bathroom Shower/Tub: Producer, television/film/video: Standard     Home Equipment: Cane - single point;Crutches;Shower seat;Rollator (4 wheels);Rolling Walker (2 wheels)   Additional Comments: reports sleeping on blow up bed or sofa on main level      Prior Functioning/Environment Prior Level of Function : Needs assist       Physical Assist : Mobility (physical);ADLs (physical)   ADLs (physical): Bathing;Dressing;Toileting;IADLs Mobility Comments: Pt reports using RW for mobility but has also used wc (states no longer has access to wc) ADLs Comments: reports able to manage some ADLs, spouse assists with IADLs and showers, LB dressing as needed    OT Problem List: Decreased strength;Decreased activity tolerance;Impaired balance (sitting and/or standing);Decreased cognition;Decreased safety awareness;Decreased knowledge of use of DME or AE;Decreased knowledge of precautions;Cardiopulmonary status limiting activity;Pain   OT Treatment/Interventions: Self-care/ADL training;Therapeutic exercise;Neuromuscular education;Energy conservation;DME and/or AE instruction;Therapeutic activities;Cognitive remediation/compensation;Patient/family education;Balance training      OT Goals(Current goals can be found in the care plan section)   Acute Rehab OT Goals Patient Stated Goal: to get stronger OT Goal Formulation: With patient/family Time For Goal Achievement: 10/25/23 Potential to Achieve Goals: Fair ADL Goals Pt Will Perform Lower Body Bathing: with contact guard assist;sit to/from stand Pt Will Perform Lower Body Dressing: with min assist;sit to/from stand Pt Will Transfer to Toilet: with contact guard assist;regular height  toilet;grab bars;ambulating Pt Will Perform Toileting - Clothing Manipulation and hygiene: with contact guard assist;sit to/from stand Pt Will Perform Tub/Shower Transfer: Shower transfer;rolling walker;grab bars;shower  seat Pt/caregiver will Perform Home Exercise Program: Increased strength;With written HEP provided;Both right and left upper extremity;Independently Additional ADL Goal #1: Patient will teach back 3/5 ECT's with BADL's and mobility with min cues   OT Frequency:  Min 2X/week       AM-PAC OT 6 Clicks Daily Activity     Outcome Measure Help from another person eating meals?: A Little Help from another person taking care of personal grooming?: A Little Help from another person toileting, which includes using toliet, bedpan, or urinal?: A Lot Help from another person bathing (including washing, rinsing, drying)?: A Lot Help from another person to put on and taking off regular upper body clothing?: A Little Help from another person to put on and taking off regular lower body clothing?: A Lot 6 Click Score: 15   End of Session Equipment Utilized During Treatment: Gait belt;Rolling walker (2 wheels) Nurse Communication: Mobility status  Activity Tolerance: Patient limited by fatigue;Patient limited by pain Patient left: in bed;with bed alarm set;with call bell/phone within reach  OT Visit Diagnosis: Unsteadiness on feet (R26.81);Muscle weakness (generalized) (M62.81);History of falling (Z91.81);Cognitive communication deficit (R41.841);Pain Pain - part of body:  (back pain)                Time: 8484-8454 OT Time Calculation (min): 30 min Charges:  OT General Charges $OT Visit: 1 Visit OT Evaluation $OT Eval Low Complexity: 1 Low OT Treatments $Self Care/Home Management : 8-22 mins  Francoise Chojnowski OT/L Acute Rehabilitation Department  [phone]  10/11/2023, 4:55 PM

## 2023-10-12 DIAGNOSIS — C9110 Chronic lymphocytic leukemia of B-cell type not having achieved remission: Secondary | ICD-10-CM

## 2023-10-12 DIAGNOSIS — E561 Deficiency of vitamin K: Secondary | ICD-10-CM | POA: Diagnosis not present

## 2023-10-12 DIAGNOSIS — D696 Thrombocytopenia, unspecified: Secondary | ICD-10-CM | POA: Diagnosis not present

## 2023-10-12 DIAGNOSIS — R579 Shock, unspecified: Secondary | ICD-10-CM | POA: Diagnosis not present

## 2023-10-12 LAB — MAGNESIUM: Magnesium: 1.5 mg/dL — ABNORMAL LOW (ref 1.7–2.4)

## 2023-10-12 LAB — PREPARE FRESH FROZEN PLASMA: Unit division: 0

## 2023-10-12 LAB — BPAM PLATELET PHERESIS
Blood Product Expiration Date: 202508292359
Blood Product Expiration Date: 202508312359
Blood Product Expiration Date: 202509012359
Blood Product Expiration Date: 202509022359
Blood Product Expiration Date: 202509022359
ISSUE DATE / TIME: 202508290217
ISSUE DATE / TIME: 202508301026
ISSUE DATE / TIME: 202508310641
ISSUE DATE / TIME: 202508310955
ISSUE DATE / TIME: 202509011654
Unit Type and Rh: 5100
Unit Type and Rh: 5100
Unit Type and Rh: 5100
Unit Type and Rh: 6200
Unit Type and Rh: 6200

## 2023-10-12 LAB — CBC WITH DIFFERENTIAL/PLATELET
Abs Immature Granulocytes: 0.05 K/uL (ref 0.00–0.07)
Basophils Absolute: 0.1 K/uL (ref 0.0–0.1)
Basophils Relative: 1 %
Eosinophils Absolute: 0.1 K/uL (ref 0.0–0.5)
Eosinophils Relative: 2 %
HCT: 30.6 % — ABNORMAL LOW (ref 39.0–52.0)
Hemoglobin: 9.8 g/dL — ABNORMAL LOW (ref 13.0–17.0)
Immature Granulocytes: 1 %
Lymphocytes Relative: 69 %
Lymphs Abs: 6.4 K/uL — ABNORMAL HIGH (ref 0.7–4.0)
MCH: 29.3 pg (ref 26.0–34.0)
MCHC: 32 g/dL (ref 30.0–36.0)
MCV: 91.3 fL (ref 80.0–100.0)
Monocytes Absolute: 1.6 K/uL — ABNORMAL HIGH (ref 0.1–1.0)
Monocytes Relative: 17 %
Neutro Abs: 0.9 K/uL — ABNORMAL LOW (ref 1.7–7.7)
Neutrophils Relative %: 10 %
Platelets: 18 K/uL — CL (ref 150–400)
RBC: 3.35 MIL/uL — ABNORMAL LOW (ref 4.22–5.81)
RDW: 17.9 % — ABNORMAL HIGH (ref 11.5–15.5)
Smear Review: NORMAL
WBC: 9.1 K/uL (ref 4.0–10.5)
nRBC: 0 % (ref 0.0–0.2)

## 2023-10-12 LAB — BASIC METABOLIC PANEL WITH GFR
Anion gap: 10 (ref 5–15)
BUN: 10 mg/dL (ref 8–23)
CO2: 24 mmol/L (ref 22–32)
Calcium: 7.7 mg/dL — ABNORMAL LOW (ref 8.9–10.3)
Chloride: 107 mmol/L (ref 98–111)
Creatinine, Ser: 0.78 mg/dL (ref 0.61–1.24)
GFR, Estimated: >60 mL/min (ref >60)
Glucose, Bld: 102 mg/dL — ABNORMAL HIGH (ref 70–99)
Potassium: 3.9 mmol/L (ref 3.5–5.1)
Sodium: 141 mmol/L (ref 135–145)

## 2023-10-12 LAB — PREPARE PLATELET PHERESIS
Unit division: 0
Unit division: 0
Unit division: 0
Unit division: 0
Unit division: 0

## 2023-10-12 LAB — BPAM FFP
Blood Product Expiration Date: 202508292321
Blood Product Expiration Date: 202509022359
ISSUE DATE / TIME: 202508310611
Unit Type and Rh: 202509022359
Unit Type and Rh: 6200
Unit Type and Rh: 6200

## 2023-10-12 LAB — GLUCOSE, CAPILLARY
Glucose-Capillary: 103 mg/dL — ABNORMAL HIGH (ref 70–99)
Glucose-Capillary: 112 mg/dL — ABNORMAL HIGH (ref 70–99)
Glucose-Capillary: 118 mg/dL — ABNORMAL HIGH (ref 70–99)
Glucose-Capillary: 119 mg/dL — ABNORMAL HIGH (ref 70–99)
Glucose-Capillary: 122 mg/dL — ABNORMAL HIGH (ref 70–99)
Glucose-Capillary: 183 mg/dL — ABNORMAL HIGH (ref 70–99)
Glucose-Capillary: 81 mg/dL (ref 70–99)
Glucose-Capillary: 88 mg/dL (ref 70–99)

## 2023-10-12 MED ORDER — SODIUM CHLORIDE 0.9% IV SOLUTION: Freq: Once | INTRAVENOUS | Status: AC

## 2023-10-12 MED ORDER — ENSURE PLUS HIGH PROTEIN PO LIQD
237.0000 mL | Freq: Two times a day (BID) | ORAL | Status: DC
  Administered 2023-10-14 – 2023-10-23 (×9): 237 mL via ORAL

## 2023-10-12 MED ORDER — ROMIPLOSTIM 250 MCG ~~LOC~~ SOLR
4.0000 ug/kg | Freq: Once | SUBCUTANEOUS | Status: AC
  Administered 2023-10-12: 250 ug via SUBCUTANEOUS
  Filled 2023-10-12: qty 0.5

## 2023-10-12 NOTE — TOC Progression Note (Signed)
 Transition of Care [facility]) - Progression Note    Patient Details  Name: Tyler Scott MRN: [id number] Date of Birth: 11/01/1955  Transition of Care [facility]) CM/SW Contact  Heather DELENA Saltness, LCSW Phone Number: 10/12/2023, 6:09 PM  Clinical Narrative:    PT now recommending short-term SNF rehab upon discharge. CSW spoke with pt and pt's wife earlier, who reports they are willing to seek SNF placement. CSW completed FL2 and sent referrals to SNF locations in [place] and surrounding areas. CSW will present bed offers to pt and wife tomorrow.    Expected Discharge Plan: Home w Home Health Services Barriers to Discharge: Continued Medical Work up    Expected Discharge Plan and Services SNF  In-house Referral: Clinical Social Work Discharge Planning Services: NA Post Acute Care Choice: Home Health Living arrangements for the past 2 months: Single Family Home                 DME Arranged: N/A DME Agency: NA       HH Arranged: NA HH Agency: Other - See comment Producer, television/film/video) Date HH Agency Contacted: 10/11/23 Time HH Agency Contacted: 1422 Representative spoke with at [facility] Agency: Jon   Social Drivers of Health (SDOH) Interventions SDOH Screenings   Food Insecurity: No Food Insecurity (10/08/2023)  Housing: Low Risk  (10/08/2023)  Transportation Needs: No Transportation Needs (10/08/2023)  Utilities: Not At Risk (10/08/2023)  Depression (PHQ2-9): Low Risk  (09/14/2023)  Social Connections: Unknown (10/08/2023)  Recent Concern: Social Connections - Moderately Isolated (10/08/2023)  Tobacco Use: Low Risk  (10/07/2023)    Readmission Risk Interventions    10/11/2023    2:19 PM 08/30/2023   11:40 AM 08/07/2023    8:49 AM  Readmission Risk Prevention Plan  Transportation Screening Complete Complete Complete  PCP or Specialist Appt within 5-7 Days   Complete  PCP or Specialist Appt within 3-5 Days  Complete   Home Care Screening   Complete  Medication Review (RN CM)   Complete   HRI or Home Care Consult  Complete   Social Work Consult for Recovery Care Planning/Counseling  Complete   Palliative Care Screening  Complete   Medication Review Oceanographer) Complete Complete   PCP or Specialist appointment within 3-5 days of discharge Complete    HRI or Home Care Consult Complete    SW Recovery Care/Counseling Consult Complete    Palliative Care Screening Not Applicable    Skilled Nursing Facility Not Applicable      Signed: Heather Saltness, MSW, LCSW Clinical Social Worker Inpatient Care Management 10/12/2023 6:12 PM
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
## 2023-10-12 NOTE — Progress Notes (Cosign Needed)
 KALEV TEMME   DOB:Feb 15, 1955   FM#:[id number]      ASSESSMENT & PLAN:  Woodley Petzold. Erber is a 68 year old male patient with oncologic history significant for CLL. Patient admitted overnight 8/28 with complaints of generalized weakness, SOB, nausea, and near-syncope. Status post fall at home. Oncology following closely.   CLL/SLL/Low grade Lymphoma -- Diagnosed with CLL in 2012 -- Patient is s/p multiple lines of therapy including bendamustine , rituximab , ibrutinib , and calquence . -- Previously many interrupted cycles of therapy due to no shows for appointments. -- Received Cycle 1 Bendamustine  (without Rituxan ) on 09/08/23. Patient was seen for follow up and toxicity check in outpatient oncology on 09/22/23. C2 started on 8/27.  -- Imaging done 8/28 shows extensive bulky lymphadenopathy throughout lower chest, abdomen, pelvis, and groin; similar to previous study. Also shows splenic enlargement. Bone lesions progressive since prior study.  -- Plan is to switch to venetoclax  -- Patient's wife requesting discharge to SNF as she is unable to care for him in his current weakened state. -- Medical Oncology/Dr. Onesimo following closely.   Anemia, severe --Hemoglobin on arrival documented 1.7, however may have been inaccurate.  Patient received 3 units PRBC.   -- Hemoglobin stable 9.8 today -- Recommend PRBC transfusion for HGB<7.0 -- continue to monitor CBC with differential   Thrombocytopenia, severe -- Platelets 10K on arrival -- Platelets 18K today, receiving one unit platelets today -- Recommend transfusion for counts <20K or <50K with bleeding -- Platelets typically nadir at day 12-14 post [facility] received 8/27... although he received <50% of dose.    -- On weekly Nplate , continue as ordered -- Monitor closely for bleeding   Electrolyte Imbalance -- Potassium noted <2.0 on arrival and increased to 7.3 post repletion. -- K+ currently stable 3.9 -- Monitor closely and replete  electrolytes   Generalized weakness Shortness of breath with near syncope -- Encourage patient to eat to optimize his nutrition -- Not on oxygen, breathing well -- Continue good hydration -- Continue supportive care    Code Status Full   Subjective:  Patient seen awake and alert laying in bed.  Platelets transfusing well, patient tolerating well with no adverse effects noted.  Patient's wife at bedside.  Reports that he feels okay.  Denies pain.  Denies shortness of breath.  No acute distress is noted.  Patient's wife requesting discharge to SNF as she is having surgery herself and will not be able to care for him at home.    Objective:   Intake/Output Summary (Last 24 hours) at 10/12/2023 1038 Last data filed at 10/12/2023 0900 Gross per 24 hour  Intake 810 ml  Output 950 ml  Net -140 ml     PHYSICAL EXAMINATION: ECOG PERFORMANCE STATUS: 3 - Symptomatic, >50% confined to bed  Vitals:   10/12/23 0915 10/12/23 1005  BP: 126/62 133/70  Pulse: 96 96  Resp:  18  Temp: 98.5 F (36.9 C) 98.5 F (36.9 C)  SpO2: 99%    Filed Weights   10/10/23 0700 10/11/23 0500 10/12/23 0706  Weight: 132 lb 4.4 oz (60 kg) 139 lb 8.8 oz (63.3 kg) 137 lb 2 oz (62.2 kg)    GENERAL: alert, no distress and comfortable +ill-appearing SKIN: +pale skin color, texture, turgor are normal, no rashes or significant lesions EYES: normal, conjunctiva are pink and non-injected, sclera clear OROPHARYNX: no exudate, no erythema and lips, buccal mucosa, and tongue normal  NECK: supple, thyroid  normal size, non-tender, without nodularity LYMPH: no palpable lymphadenopathy in  the cervical, axillary or inguinal LUNGS: clear to auscultation and percussion with normal breathing effort HEART: regular rate & rhythm and no murmurs and no lower extremity edema ABDOMEN: abdomen soft, non-tender and normal bowel sounds MUSCULOSKELETAL: no cyanosis of digits and no clubbing  PSYCH: alert & oriented x 3 with fluent  speech NEURO: no focal motor/sensory deficits   All questions were answered. The patient knows to call the clinic with any problems, questions or concerns.   The total time spent in the appointment was 40 minutes encounter with patient including review of chart and various tests results, discussions about plan of care and coordination of care plan  [name], NP 10/12/2023 10:38 AM    Labs Reviewed:  Lab Results  Component Value Date   WBC 9.1 10/12/2023   HGB 9.8 (L) 10/12/2023   HCT 30.6 (L) 10/12/2023   MCV 91.3 10/12/2023   PLT 18 (LL) 10/12/2023   Recent Labs    10/07/23 2341 10/08/23 0725 10/08/23 1349 10/10/23 0345 10/11/23 0330 10/12/23 0535  NA 132* 135   < > 137 138 141  K 7.3* 4.8   < > 3.6 3.3* 3.9  CL 99 98   < > 106 105 107  CO2 18* 21*   < > 21* 23 24  GLUCOSE 51* 76   < > 117* 101* 102*  BUN 44* 46*   < > 20 14 10   CREATININE 2.36* 2.31*   < > 0.92 0.76 0.78  CALCIUM  5.9* 5.8*   < > 7.0* 7.2* 7.7*  GFRNONAA 29* 30*   < > >60 >60 >60  PROT 4.9* 4.7*  --  4.5*  --   --   ALBUMIN 3.1* 2.9*  --  2.9*  --   --   AST 130* 118*  --  21  --   --   ALT 36 43  --  20  --   --   ALKPHOS 148* 131*  --  109  --   --   BILITOT 1.1 1.2  --  1.2  --   --    < > = values in this interval not displayed.    Studies Reviewed:  CT HEAD WO CONTRAST ( ) Result Date: 10/07/2023 CLINICAL DATA:  Recent fall with headaches, initial encounter EXAM: CT HEAD WITHOUT CONTRAST TECHNIQUE: Contiguous axial images were obtained from the base of the skull through the vertex without intravenous contrast. RADIATION DOSE REDUCTION: This exam was performed according to the departmental dose-optimization program which includes automated exposure control, adjustment of the mA and/or kV according to patient size and/or use of iterative reconstruction technique. COMPARISON:  None Available. FINDINGS: Brain: No evidence of acute infarction, hemorrhage, hydrocephalus, extra-axial collection or  mass lesion/mass effect. Mild atrophic changes are noted. Vascular: No hyperdense vessel or unexpected calcification. Skull: Normal. Negative for fracture or focal lesion. Sinuses/Orbits: No acute finding. Other: None. IMPRESSION: No acute intracranial abnormality noted. Electronically Signed   By: Oneil Devonshire M.D.   On: 10/07/2023 21:12   CT ABDOMEN PELVIS W CONTRAST Result Date: 10/07/2023 CLINICAL DATA:  Acute nonlocalized abdominal pain. History of CLL. Hypotensive [facility] with tachycardia and decreased oxygenation. EXAM: CT ABDOMEN AND PELVIS WITH CONTRAST TECHNIQUE: Multidetector CT imaging of the abdomen and pelvis was performed using the standard protocol following bolus administration of intravenous contrast. RADIATION DOSE REDUCTION: This exam was performed according to the departmental dose-optimization program which includes automated exposure control, adjustment of the mA and/or kV  according to patient size and/or use of iterative reconstruction technique. CONTRAST:  OMNIPAQUE  IOHEXOL  300 MG/ML  SOLN COMPARISON:  08/06/2023 FINDINGS: Lower chest: Large left and small right pleural effusions. Basilar atelectasis or consolidation also greater on the left. Cardiac enlargement. Hepatobiliary: No focal liver abnormality is seen. No gallstones, gallbladder wall thickening, or biliary dilatation. Pancreas: Unremarkable. No pancreatic ductal dilatation or surrounding inflammatory changes. Spleen: Spleen is enlarged. Focal wedge-shaped defect in the posterior periphery likely representing splenic infarct. Adrenals/Urinary Tract: No adrenal gland nodules. Kidneys are symmetrical. No hydronephrosis or hydroureter. Bladder is normal. Stomach/Bowel: Stomach, small bowel, and colon are not abnormally distended. No wall thickening or inflammatory infiltration. Appendix is normal. Vascular/Lymphatic: Calcification of the aorta. No aneurysm. There is extensive bulky lymphadenopathy involving the  retrocrural space, celiac axis, splenic hilar region, throughout the retroperitoneum, throughout the mesentery, bilateral anterior and posterior pelvic chains, in the sciatic region, and in the groin regions. Largest retroperitoneal lymph node mass measures about 8.9 x 8.5 cm on transverse dimension. Measured at a similar level, there is no significant change since the prior study. Reproductive: Prostate is unremarkable. Other: Small amount of free fluid along the pericolic gutters. No free air. Musculoskeletal: Degenerative changes in the spine. Lucent lesions demonstrated in the sacrum and pelvis, most prominent on the right. Focal lesions measure up to about 1.7 cm diameter. These lesions are progressive since prior study. IMPRESSION: 1. Extensive bulky lymphadenopathy throughout the lower chest, abdomen, pelvis, and groin regions. Lymphadenopathy is similar to the previous study. 2. Splenic enlargement with peripheral splenic infarct. This is likely due to lymphomatous involvement. 3. Small lucent lesions in the pelvis may represent lymphomatous involvement. Bone lesions appear progressive since prior study. 4. Moderate left and small right pleural effusions with basilar atelectasis, progressing since prior study. Cardiac enlargement. Electronically Signed   By: Elsie Gravely M.D.   On: 10/07/2023 17:51   DG Chest Port 1 View Result Date: 10/07/2023 CLINICAL DATA:  Questionable sepsis. EXAM: PORTABLE CHEST 1 VIEW COMPARISON:  Chest CT dated 08/07/2023. FINDINGS: Small left pleural effusion and left lung base atelectasis or infiltrate. No pneumothorax. The cardiac silhouette is within limits. Right paratracheal density consistent with adenopathy. No acute osseous pathology. IMPRESSION: 1. Small left pleural effusion and left lung base atelectasis or infiltrate. 2. Right paratracheal adenopathy. Electronically Signed   By: Vanetta Chou M.D.   On: 10/07/2023 15:59   ADDENDDUM  .Patient was Personally  and independently interviewed, examined and relevant elements of the history of present illness were reviewed in details and an assessment and plan was created. All elements of the patient's history of present illness , assessment and plan were discussed in details with Christinia Lambeth NP. The above documentation reflects our combined findings assessment and plan.   Cytopenia related to CLL and poor po intake and to a lesser extent Bendamustine  - no further Bendamustine  -venetoclax  + Rituxan  once PLt counts better. Venetoclax  -- prescription sent -- plan to starti n 2-3 weeks as outpatient. -transfuse prn for hgb<7.5 and PLT<20k Receive Nplate  4mcg/kg today and will continue weekly to maintain PLT count of >50k Dietician anPT inputs Will likely need SNF on discharge Gautam Kale MD MS

## 2023-10-12 NOTE — NC FL2 (Signed)
 [place]  MEDICAID FL2 LEVEL OF CARE FORM     IDENTIFICATION  Patient Name: Tyler Scott Birthdate: [birth year] Sex: male Admission Date (Current Location): 10/07/2023  [facility] and [place] Number:  Producer, television/film/video and Address:  [facility],  [address], [place] [address]      Provider Number: [id number]  Attending Physician Name and Address:  [place], Eric J, DO  Relative Name and Phone Number:  Colvin, Blatt (Spouse)  [phone]    Current Level of Care: Hospital Recommended Level of Care: Skilled Nursing Facility Prior Approval Number:    Date Approved/Denied:   PASRR Number: [id number]  Discharge Plan: SNF    Current Diagnoses: Patient Active Problem List   Diagnosis Date Noted   Vitamin K deficiency 10/08/2023   Tumor lysis syndrome 10/08/2023   Symptomatic anemia 10/08/2023   Pressure injury of skin 10/08/2023   Shock (HCC) 10/07/2023   Thrombocytopenia (HCC) 09/01/2023   Anemia 09/01/2023   Need for emotional support 08/29/2023   Palliative care encounter 08/28/2023   High risk medication use 08/28/2023   Medication management 08/28/2023   Counseling and coordination of care 08/28/2023   Cancer associated pain 08/28/2023   Cancer-related breakthrough pain 08/27/2023   Normocytic anemia 08/27/2023   Lymphoma, low grade (HCC) 08/27/2023   Hyponatremia 08/27/2023   Chronic ITP (idiopathic thrombocytopenia) (HCC) 08/11/2023   Insulin  dependent type 2 diabetes mellitus (HCC) 08/06/2023   AKI (acute kidney injury) (HCC) 08/06/2023   Pancytopenia (HCC) 08/06/2023   Controlled type 2 diabetes mellitus with retinopathy, with long-term current use of insulin  (HCC) 09/14/2016   Hx of adenomatous polyp of colon 05/24/2014   Family history of colon cancer - brother 18 05/18/2014   Hyperkalemia 01/21/2013   Splenomegaly 01/21/2013   CLL (chronic lymphocytic leukemia) (HCC)    Type 1 diabetes mellitus (HCC) 12/29/2007    Hyperlipidemia 12/30/2006   Essential hypertension 12/30/2006   ERECTILE DYSFUNCTION, ORGANIC 12/30/2006   LOW BACK PAIN, ACUTE 12/21/2006    Orientation RESPIRATION BLADDER Height & Weight     Self, Time, Situation, Place  Normal Continent Weight: 137 lb 2 oz (62.2 kg) Height:  5' 8 (172.7 cm)  BEHAVIORAL SYMPTOMS/MOOD NEUROLOGICAL BOWEL NUTRITION STATUS      Continent Diet (Regular)  AMBULATORY STATUS COMMUNICATION OF NEEDS Skin   Limited Assist Verbally PU Stage and Appropriate Care, Other (Comment) (Wound 10/07/23 2200 Pressure Injury Coccyx Stage 3 -  Full thickness tissue loss.)                       Personal Care Assistance Level of Assistance  Bathing, Feeding, Dressing Bathing Assistance: Limited assistance Feeding assistance: Independent Dressing Assistance: Limited assistance     Functional Limitations Info  Sight, Hearing, Speech Sight Info: Impaired Hearing Info: Adequate Speech Info: Adequate    SPECIAL CARE FACTORS FREQUENCY  PT (By licensed PT), OT (By licensed OT)     PT Frequency: 5x per week OT Frequency: 5x per week            Contractures Contractures Info: Not present    Additional Factors Info  Code Status, Allergies Code Status Info: FULL Allergies Info: NKA           Current Medications (10/12/2023):  This is the current hospital active medication list Current Facility-Administered Medications  Medication Dose Route Frequency Provider Last Rate Last Admin   0.9 %  sodium chloride  infusion (Manually program via Guardrails IV Fluids)  Intravenous Once Doretha Folks, MD       acyclovir  (ZOVIRAX ) tablet 400 mg  400 mg Oral Daily [place], Eric J, DO   400 mg at 10/12/23 9061   allopurinol  (ZYLOPRIM ) tablet 150 mg  150 mg Oral Daily [place], Eric J, DO   150 mg at 10/12/23 9061   artificial tears ophthalmic solution 1 drop  1 drop Both Eyes PRN Neda, Adewale A, MD   1 drop at 10/10/23 2212   Chlorhexidine  Gluconate Cloth 2 %  PADS 6 each  6 each Topical Daily Ilah Corean HERO, PA-C   6 each at 10/12/23 1630   docusate sodium  (COLACE) capsule 100 mg  100 mg Oral BID PRN Ilah Corean HERO, PA-C       doxepin  (SINEQUAN ) capsule 25 mg  25 mg Oral QHS [place], Eric J, DO   25 mg at 10/11/23 2156   feeding supplement (ENSURE PLUS HIGH PROTEIN) liquid 237 mL  237 mL Oral BID BM [place], Eric J, DO       gabapentin  (NEURONTIN ) capsule 100 mg  100 mg Oral TID [place], Eric J, DO   100 mg at 10/12/23 1622   guaiFENesin -dextromethorphan  (ROBITUSSIN DM) 100-10 MG/5ML syrup 5 mL  5 mL Oral Q4H PRN Ogan, Okoronkwo U, MD   5 mL at 10/09/23 9567   HYDROmorphone  (DILAUDID ) tablet 2 mg  2 mg Oral QHS [place], Eric J, DO   2 mg at 10/11/23 2156   insulin  aspart (novoLOG ) injection 0-9 Units  0-9 Units Subcutaneous Q4H Ilah Corean HERO, PA-C   1 Units at 10/10/23 9692   lip balm (CARMEX) ointment   Topical PRN Kara Dorn NOVAK, MD   Given at 10/08/23 2237   methocarbamol  (ROBAXIN ) tablet 500 mg  500 mg Oral QHS [place], Eric J, DO   500 mg at 10/11/23 2156   morphine  (PF) 2 MG/ML injection 2 mg  2 mg Intravenous Q3H PRN [place], Eric J, DO   2 mg at 10/10/23 1319   ondansetron  (ZOFRAN ) injection 4 mg  4 mg Intravenous Q6H PRN Ilah Corean M, PA-C       pantoprazole  (PROTONIX ) injection 40 mg  40 mg Intravenous Q24H Kara Dorn B, MD   40 mg at 10/11/23 2157   polyethylene glycol (MIRALAX  / GLYCOLAX ) packet 17 g  17 g Oral Daily PRN Ilah Corean HERO, PA-C       simvastatin  (ZOCOR ) tablet 20 mg  20 mg Oral q1800 [place], Eric J, DO   20 mg at 10/12/23 1736   traMADol  (ULTRAM ) tablet 50 mg  50 mg Oral Q6H PRN [place], Eric J, DO   50 mg at 10/12/23 1736     Discharge Medications: Please see discharge summary for a list of discharge medications.  Relevant Imaging Results:  Relevant Lab Results:   Additional Information SSN: [id number]  Heather LABOR Jamita Mckelvin, LCSW

## 2023-10-12 NOTE — Plan of Care (Signed)
  Problem: Education: Goal: Ability to describe self-care measures that may prevent or decrease complications (Diabetes Survival Skills Education) will improve Outcome: Progressing Goal: Individualized Educational Video(s) Outcome: Progressing   Problem: Coping: Goal: Ability to adjust to condition or change in health will improve Outcome: Progressing   Problem: Fluid Volume: Goal: Ability to maintain a balanced intake and output will improve Outcome: Progressing   Problem: Health Behavior/Discharge Planning: Goal: Ability to identify and utilize available resources and services will improve Outcome: Progressing Goal: Ability to manage health-related needs will improve Outcome: Progressing   Problem: Metabolic: Goal: Ability to maintain appropriate glucose levels will improve Outcome: Progressing   Problem: Skin Integrity: Goal: Risk for impaired skin integrity will decrease Outcome: Progressing   Problem: Clinical Measurements: Goal: Ability to maintain clinical measurements within normal limits will improve Outcome: Progressing Goal: Will remain free from infection Outcome: Progressing Goal: Diagnostic test results will improve Outcome: Progressing Goal: Respiratory complications will improve Outcome: Progressing Goal: Cardiovascular complication will be avoided Outcome: Progressing   Problem: Activity: Goal: Risk for activity intolerance will decrease Outcome: Progressing   Problem: Pain Managment: Goal: General experience of comfort will improve and/or be controlled Outcome: Progressing   Problem: Safety: Goal: Ability to remain free from injury will improve Outcome: Progressing   Problem: Skin Integrity: Goal: Risk for impaired skin integrity will decrease Outcome: Progressing

## 2023-10-12 NOTE — Progress Notes (Signed)
 PROGRESS NOTE    Tyler Scott  FMW:[id number] DOB: [birth year] DOA: 10/07/2023 PCP: [facility]    Brief Narrative:   Tyler Scott is a 68 y.o. male with past medical history significant for CLL with recent initiation of chemotherapy, HTN, HLD, DM 1, chronic ITP (baseline Plt 40's) who presented to [facility] ED from the cancer center with complaints of abdominal pain, nausea, poor oral intake, generalized weakness with near syncope and shortness of breath.  Patient was due for next chemotherapy infusion today, 8/28 and when attempting to get to the car had an episode of near syncope with associated shortness of breath and generalized weakness.  EMS was activated in which she was noted to be tachycardic, hypotensive with mild AMS (improved shortly after arrival).  Patient's wife also noted hyperglycemia to the 300s the morning of 10/07/2023 in which she administered insulin .  On ED arrival, patient was afebrile with HR 116, BP 95/67, RR 25, SpO2 94%. Labs were notable for WBC 14.6 (down from 17.1 8/27 with ANC 1100), Hgb 1.7 (8.2), Plt 10 (47). INR > 10 (1 two months PTA). CMP/lipase pending. Trop 46. LA 2.5 > 5.4. VBG 7.21/pCO2 < 18/pO2 < 31/bicarb 5.6. Initial CBG 40. CXR demonstrated small L pleural effusion and L lung base atelectasis with R paratracheal adenoapthyp; CT A/P showed extensive bulky LAD throughout the lower chest, abdomen, pelvis and groin regions (similar from prior), splenic enlargement with peripheral splenic infarct, small lucent lesions in the pelvis c/f lymphomatous involvement, moderate L and small R pleural effusions with atelectasis. Broad-spectrum antibiotics were started, Cx pending. Levophed  started for hypotension. Oncology consulted. PCCM consulted for ICU admission.  Significant hospital events: 8/28: Admit ICU, started on vasopressors, oncology consulted; transfused 2 unit PRBC 8/29: Levophed  titrated off; received rasburicase , 1 unit platelets, 1  unit PRBC transfusion 8/30: 1 unit platelet transfusion 8/31: Transferred to [facility]; 2 unit platelet transfusion 9/1: 1u platelet transfusion, transfer to 6 E. 9/2: transfuse 1u Plt, to receive NPLATE  per oncology today  Assessment & Plan:   Shock, undifferentiated Concern for sepsis, ruled out Patient presenting with weakness and noted to be hypotensive, initially admitted to the intensive care unit on vasopressors.  Suspect etiology likely secondary to severe dehydration, poor oral intake with recent chemotherapy infusion as well as tumor lysis syndrome as above.  Initially was started on empiric antibiotics with vancomycin , cefepime  and doxycycline  which were stopped.  Acute hypoxemic respiratory failure: Resolved Supplemental oxygen weaned off.  CLL/SLL/low-grade lymphoma Tumor lysis syndrome Patient initially diagnosed with CLL 2012. Most recently patient received Cycle 1 Bendamustine  (without Rituxan ) on 09/08/23.  Imaging 8/29 with extensive bulky lymphadenopathy throughout the lower chest, abdomen, pelvis and groin similar to previous study also with splenic enlargement, noted bone lesions progressive since prior study.  Uric acid 12.3, received rasburicase  on 8/28. -- Uric acid 12.3>5.1..<1.5 -- Further per oncology  Acute renal failure: Resolved Suspect etiology multifactorial with ATN from hypotension/shock as well as prerenal azotemia from severe dehydration.  No hydronephrosis noted on CT abdomen/pelvis at time of admission.  IV fluids now discontinued. -- Ct 2.48>2.36>>1.58>0.92>0.76 -- BMP in am  Anemia, severe Hemoglobin on arrival 1.7; transfused 3 unit PRBCs. -- Hgb 1.7>9.3>10.0>8.7>9.8 -- Transfuse for hemoglobin less than 7.0 -- CBC daily  Thrombocytopenia, severe Platelet count 10K arrival.  Received 1 unit platelet on 8/29, 8/30, 2 u on 8/31, 1u 9/1 -- Plt 10>15>18>>14>18>15>27>18 -- Transfuse 1 unit platelets today -- NPLATE  ordered by oncology today -- Repeat  CBC 2 hours following transfusion -- transfuse for counts <20K or <50K with bleeding  -- CBC with diff daily; oncology would like platelet counts 50K or above before stable for discharge  Hypokalemia Repleted.  Potassium 3.9 today -- Repeat electrolytes in a.m.  HTN Patient was notably hypotensive/shock as above at time of arrival. -- BP 132/77, stable -- Continue monitor BP closely  HLD -- Zocor  20 mg p.o. daily  Type 1 diabetes mellitus On Jardiance  12.5 mg p.o. daily, Lantus  10-30 units [place] Prn at bedtime at baseline -- Sensitive SSI for coverage  Chronic pain Patient reportedly on MS Contin  15 mg p.o. nightly, gabapentin  100 mg p.o. 3 times daily Dilaudid  2 mg p.o. nightly Robaxin  500 mg p.o. nightly -- Hold home MS Contin  -- Continue gabapentin  100 mg PO TID -- Continue dialudid 2 mg PO at bedtime -- Continue Robaxin  500 mg PO qHS  Weakness/debility/deconditioning/gait disturbance: -- PT currently recommending home health -- OT recommending SNF -- Spouse reports unable to care for patient currently at home; [facility] consulted   DVT prophylaxis: SCDs Start: 10/07/23 1835    Code Status: Full Code Family Communication: No family present bedside this morning  Disposition Plan:  Level of care: Telemetry Status is: Inpatient Remains inpatient appropriate because: Platelet transfusion, Nplate  infusion, may need SNF placement    Consultants:  PCCM - singed off 8/31 Medical Oncology  Procedures:  none  Antimicrobials:  Cefepime  8/28 - 8/28 Vancomycin  8/28 - 8/28 Doxycycline  8/28 - 8/30   Subjective: Patient seen examined bedside, lying in bed.  No complaints this morning.  Platelet count dropped from 18 this morning, will transfuse an additional unit of platelets.  Discussed with oncology, to receive Nplate  infusion as well today.  Oncology, Dr. Onesimo would like his platelets greater than 50K before stable for discharge home.  No other questions, concerns or complaints  at this time.  Denies headache, no dizziness, no chest pain, no palpitations, no shortness of breath, no abdominal pain, no fever/chills/night sweats, no nausea/vomiting.  No acute concerns overnight per nursing staff.  Objective: Vitals:   10/12/23 0915 10/12/23 1005 10/12/23 1020 10/12/23 1317  BP: 126/62 133/70 136/65 133/62  Pulse: 96 96 94 95  Resp:  18 16 16   Temp: 98.5 F (36.9 C) 98.5 F (36.9 C) 98.4 F (36.9 C) 98.2 F (36.8 C)  TempSrc: Oral Oral  Oral  SpO2: 99%   98%  Weight:      Height:        Intake/Output Summary (Last 24 hours) at 10/12/2023 1319 Last data filed at 10/12/2023 1313 Gross per 24 hour  Intake 946 ml  Output 950 ml  Net -4 ml   Filed Weights   10/10/23 0700 10/11/23 0500 10/12/23 0706  Weight: 60 kg 63.3 kg 62.2 kg    Examination:  Physical Exam: GEN: NAD, alert and oriented x 3, ill in appearance HEENT: NCAT, PERRL, EOMI, sclera clear, MMM PULM: CTAB w/o wheezes/crackles, normal respiratory effort, room air CV: RRR w/o M/G/R GI: abd soft, NTND, + BS MSK: no peripheral edema, moves all extremity independently NEURO: No focal neurological deficit PSYCH: normal mood/affect Integumentary: No concerning rashes/lesions/wounds noted on exposed skin surfaces    Data Reviewed: I have personally reviewed following labs and imaging studies  CBC: Recent Labs  Lab 10/09/23 1609 10/10/23 0345 10/10/23 1447 10/11/23 0330 10/12/23 0535  WBC 11.8* 8.4 14.1* 8.7 9.1  NEUTROABS 1.7 1.2* 1.8 1.0* 0.9*  HGB 10.0* 8.7* 10.4* 8.7* 9.8*  HCT 30.7* 27.3* 33.4* 26.7* 30.6*  MCV 90.8 92.2 93.3 93.0 91.3  PLT 18* 15* 27* 18* 18*   Basic Metabolic Panel: Recent Labs  Lab 10/07/23 2151 10/07/23 2341 10/08/23 0725 10/08/23 1349 10/09/23 0315 10/10/23 0345 10/11/23 0330 10/12/23 0535  NA 134* 132* 135  --  138 137 138 141  K 6.4* 7.3* 4.8 4.5 4.1 3.6 3.3* 3.9  CL 97* 99 98  --  104 106 105 107  CO2 21* 18* 21*  --  20* 21* 23 24  GLUCOSE 45*  51* 76  --  84 117* 101* 102*  BUN 42* 44* 46*  --  36* 20 14 10   CREATININE 2.48* 2.36* 2.31*  --  1.58* 0.92 0.76 0.78  CALCIUM  6.1* 5.9* 5.8*  --  6.1* 7.0* 7.2* 7.7*  MG 2.4 2.3 2.4  --   --   --   --  1.5*  PHOS 8.3* 7.7* 7.5*  --   --   --   --   --    GFR: Estimated Creatinine Clearance: 78.8 mL/min (by C-G formula based on SCr of 0.78 mg/dL). Liver Function Tests: Recent Labs  Lab 10/07/23 1539 10/07/23 2151 10/07/23 2341 10/08/23 0725 10/10/23 0345  AST 16 132* 130* 118* 21  ALT <5 36 36 43 20  ALKPHOS 27* 160* 148* 131* 109  BILITOT <0.2 1.3* 1.1 1.2 1.2  PROT <3.0* 5.4* 4.9* 4.7* 4.5*  ALBUMIN <1.5* 3.4* 3.1* 2.9* 2.9*   Recent Labs  Lab 10/07/23 1539  LIPASE <10*   No results for input(s): AMMONIA in the last 168 hours. Coagulation Profile: Recent Labs  Lab 10/07/23 1539 10/07/23 2151 10/07/23 2341 10/08/23 0725 10/08/23 1027  INR >10.0* 2.1* 2.1* 2.0* 1.9*   Cardiac Enzymes: No results for input(s): CKTOTAL, CKMB, CKMBINDEX, TROPONINI in the last 168 hours. BNP (last 3 results) No results for input(s): PROBNP in the last 8760 hours. HbA1C: No results for input(s): HGBA1C in the last 72 hours. CBG: Recent Labs  Lab 10/11/23 2110 10/11/23 2336 10/12/23 0413 10/12/23 0715 10/12/23 1151  GLUCAP 88 95 81 103* 118*   Lipid Profile: No results for input(s): CHOL, HDL, LDLCALC, TRIG, CHOLHDL, LDLDIRECT in the last 72 hours. Thyroid  Function Tests: No results for input(s): TSH, T4TOTAL, FREET4, T3FREE, THYROIDAB in the last 72 hours. Anemia Panel: No results for input(s): VITAMINB12, FOLATE, FERRITIN, TIBC, IRON, RETICCTPCT in the last 72 hours. Sepsis Labs: Recent Labs  Lab 10/07/23 1541 10/07/23 1751 10/07/23 2341  LATICACIDVEN 2.5* 5.4* 2.3*    Recent Results (from the past 240 hours)  Blood Culture (routine x 2)     Status: Abnormal   Collection Time: 10/07/23  3:39 PM   Specimen: BLOOD  RIGHT ARM  Result Value Ref Range Status   Specimen Description   Final    BLOOD RIGHT ARM Performed at [facility], [address], [place], [place] [address]    Special Requests   Final    BOTTLES DRAWN AEROBIC ONLY Blood Culture results may not be optimal due to an inadequate volume of blood received in culture bottles Performed at [facility], [address], [place], [place] [address]    Culture  Setup Time   Final    GRAM POSITIVE COCCI IN CLUSTERS AEROBIC BOTTLE ONLY CRITICAL RESULT CALLED TO, READ BACK BY AND VERIFIED WITH: PHARMD CHRISTINE SHADE ON 10/08/23 @ 1501 BY DRT    Culture (A)  Final    STAPHYLOCOCCUS  HOMINIS THE SIGNIFICANCE OF ISOLATING THIS ORGANISM FROM A SINGLE SET OF BLOOD CULTURES WHEN MULTIPLE SETS ARE DRAWN IS UNCERTAIN. PLEASE NOTIFY THE MICROBIOLOGY DEPARTMENT WITHIN ONE WEEK IF SPECIATION AND SENSITIVITIES ARE REQUIRED. Performed at [facility], [address] [place], [place] [address]    Report Status 10/10/2023 FINAL  Final  Blood Culture ID Panel (Reflexed)     Status: Abnormal   Collection Time: 10/07/23  3:39 PM  Result Value Ref Range Status   Enterococcus faecalis NOT DETECTED NOT DETECTED Final   Enterococcus Faecium NOT DETECTED NOT DETECTED Final   Listeria monocytogenes NOT DETECTED NOT DETECTED Final   Staphylococcus species DETECTED (A) NOT DETECTED Final    Comment: CRITICAL RESULT CALLED TO, READ BACK BY AND VERIFIED WITH: PHARMD CHRISTINE SHADE ON 10/08/23 @ 1501 BY DRT    Staphylococcus aureus (BCID) NOT DETECTED NOT DETECTED Final   Staphylococcus epidermidis NOT DETECTED NOT DETECTED Final   Staphylococcus lugdunensis NOT DETECTED NOT DETECTED Final   Streptococcus species NOT DETECTED NOT DETECTED Final   Streptococcus agalactiae NOT DETECTED NOT DETECTED Final   Streptococcus pneumoniae NOT DETECTED NOT DETECTED Final   Streptococcus pyogenes NOT DETECTED NOT DETECTED Final    A.calcoaceticus-baumannii NOT DETECTED NOT DETECTED Final   Bacteroides fragilis NOT DETECTED NOT DETECTED Final   Enterobacterales NOT DETECTED NOT DETECTED Final   Enterobacter cloacae complex NOT DETECTED NOT DETECTED Final   Escherichia coli NOT DETECTED NOT DETECTED Final   Klebsiella aerogenes NOT DETECTED NOT DETECTED Final   Klebsiella oxytoca NOT DETECTED NOT DETECTED Final   Klebsiella pneumoniae NOT DETECTED NOT DETECTED Final   Proteus species NOT DETECTED NOT DETECTED Final   Salmonella species NOT DETECTED NOT DETECTED Final   Serratia marcescens NOT DETECTED NOT DETECTED Final   Haemophilus influenzae NOT DETECTED NOT DETECTED Final   Neisseria meningitidis NOT DETECTED NOT DETECTED Final   Pseudomonas aeruginosa NOT DETECTED NOT DETECTED Final   Stenotrophomonas maltophilia NOT DETECTED NOT DETECTED Final   Candida albicans NOT DETECTED NOT DETECTED Final   Candida auris NOT DETECTED NOT DETECTED Final   Candida glabrata NOT DETECTED NOT DETECTED Final   Candida krusei NOT DETECTED NOT DETECTED Final   Candida parapsilosis NOT DETECTED NOT DETECTED Final   Candida tropicalis NOT DETECTED NOT DETECTED Final   Cryptococcus neoformans/gattii NOT DETECTED NOT DETECTED Final    Comment: Performed at [facility], [address], [place], [place] [address]  Respiratory (~20 pathogens) panel by PCR     Status: None   Collection Time: 10/07/23  7:47 PM   Specimen: Nasopharyngeal Swab; Respiratory  Result Value Ref Range Status   Adenovirus NOT DETECTED NOT DETECTED Final   Coronavirus 229E NOT DETECTED NOT DETECTED Final    Comment: (NOTE) The Coronavirus on the Respiratory Panel, DOES NOT test for the novel  Coronavirus (2019 nCoV)    Coronavirus HKU1 NOT DETECTED NOT DETECTED Final   Coronavirus NL63 NOT DETECTED NOT DETECTED Final   Coronavirus OC43 NOT DETECTED NOT DETECTED Final   Metapneumovirus NOT DETECTED NOT DETECTED Final   Rhinovirus / Enterovirus NOT  DETECTED NOT DETECTED Final   Influenza A NOT DETECTED NOT DETECTED Final   Influenza B NOT DETECTED NOT DETECTED Final   Parainfluenza Virus 1 NOT DETECTED NOT DETECTED Final   Parainfluenza Virus 2 NOT DETECTED NOT DETECTED Final   Parainfluenza Virus 3 NOT DETECTED NOT DETECTED Final   Parainfluenza Virus 4 NOT DETECTED NOT DETECTED Final  Respiratory Syncytial Virus NOT DETECTED NOT DETECTED Final   Bordetella pertussis NOT DETECTED NOT DETECTED Final   Bordetella Parapertussis NOT DETECTED NOT DETECTED Final   Chlamydophila pneumoniae NOT DETECTED NOT DETECTED Final   Mycoplasma pneumoniae NOT DETECTED NOT DETECTED Final    Comment: Performed at [facility], [address] [place], [place] [address]  Resp panel by RT-PCR (RSV, Flu A&B, Covid) Anterior Nasal Swab     Status: None   Collection Time: 10/07/23  7:47 PM   Specimen: Anterior Nasal Swab  Result Value Ref Range Status   SARS Coronavirus 2 by RT PCR NEGATIVE NEGATIVE Final    Comment: (NOTE) SARS-CoV-2 target nucleic acids are NOT DETECTED.  The SARS-CoV-2 RNA is generally detectable in upper respiratory specimens during the acute phase of infection. The lowest concentration of SARS-CoV-2 viral copies this assay can detect is 138 copies/mL. A negative result does not preclude SARS-Cov-2 infection and should not be used as the sole basis for treatment or other patient management decisions. A negative result may occur with  improper specimen collection/handling, submission of specimen other than nasopharyngeal swab, presence of viral mutation(s) within the areas targeted by this assay, and inadequate number of viral copies(<138 copies/mL). A negative result must be combined with clinical observations, patient history, and epidemiological information. The expected result is Negative.  Fact Sheet for Patients:  BloggerCourse.com  Fact Sheet for Healthcare Providers:   SeriousBroker.it  This test is no t yet approved or cleared by the [place]  FDA and  has been authorized for detection and/or diagnosis of SARS-CoV-2 by FDA under an Emergency Use Authorization (EUA). This EUA will remain  in effect (meaning this test can be used) for the duration of the COVID-19 declaration under Section 564(b)(1) of the Act, 21 U.S.C.section 360bbb-3(b)(1), unless the authorization is terminated  or revoked sooner.       Influenza A by PCR NEGATIVE NEGATIVE Final   Influenza B by PCR NEGATIVE NEGATIVE Final    Comment: (NOTE) The Xpert Xpress SARS-CoV-2/FLU/RSV plus assay is intended as an aid in the diagnosis of influenza from Nasopharyngeal swab specimens and should not be used as a sole basis for treatment. Nasal washings and aspirates are unacceptable for Xpert Xpress SARS-CoV-2/FLU/RSV testing.  Fact Sheet for Patients: BloggerCourse.com  Fact Sheet for Healthcare Providers: SeriousBroker.it  This test is not yet approved or cleared by the [place]  FDA and has been authorized for detection and/or diagnosis of SARS-CoV-2 by FDA under an Emergency Use Authorization (EUA). This EUA will remain in effect (meaning this test can be used) for the duration of the COVID-19 declaration under Section 564(b)(1) of the Act, 21 U.S.C. section 360bbb-3(b)(1), unless the authorization is terminated or revoked.     Resp Syncytial Virus by PCR NEGATIVE NEGATIVE Final    Comment: (NOTE) Fact Sheet for Patients: BloggerCourse.com  Fact Sheet for Healthcare Providers: SeriousBroker.it  This test is not yet approved or cleared by the [place]  FDA and has been authorized for detection and/or diagnosis of SARS-CoV-2 by FDA under an Emergency Use Authorization (EUA). This EUA will remain in effect (meaning this test can be used) for  the duration of the COVID-19 declaration under Section 564(b)(1) of the Act, 21 U.S.C. section 360bbb-3(b)(1), unless the authorization is terminated or revoked.  Performed at [facility], [address] [place] [address], [place], [place] [address]   Blood Culture (routine x 2)     Status: None (Preliminary result)   Collection Time:  10/07/23  9:51 PM   Specimen: BLOOD LEFT ARM  Result Value Ref Range Status   Specimen Description   Final    BLOOD LEFT ARM Performed at [facility], [address] [place], [place] [address]    Special Requests   Final    BOTTLES DRAWN AEROBIC AND ANAEROBIC Blood Culture results may not be optimal due to an inadequate volume of blood received in culture bottles Performed at [facility], [address], [place], [place] [address]    Culture   Final    NO GROWTH 4 DAYS Performed at [facility], [address] [place] [address] [place], [place] [address]    Report Status PENDING  Incomplete  MRSA Next Gen by PCR, Nasal     Status: None   Collection Time: 10/07/23 10:01 PM   Specimen: Nasal Mucosa; Nasal Swab  Result Value Ref Range Status   MRSA by PCR Next Gen NOT DETECTED NOT DETECTED Final    Comment: (NOTE) The GeneXpert MRSA Assay (FDA approved for NASAL specimens only), is one component of a comprehensive MRSA colonization surveillance program. It is not intended to diagnose MRSA infection nor to guide or monitor treatment for MRSA infections. Test performance is not FDA approved in patients less than 80 years old. Performed at [facility], [address] [place], [place] [address]          Radiology Studies: No results found.      Scheduled Meds:  sodium chloride    Intravenous Once   acyclovir   400 mg Oral Daily   allopurinol   150 mg Oral Daily   Chlorhexidine  Gluconate Cloth  6 each Topical Daily   doxepin   25 mg Oral QHS   feeding supplement  237 mL Oral BID BM   gabapentin   100  mg Oral TID   HYDROmorphone   2 mg Oral QHS   insulin  aspart  0-9 Units Subcutaneous Q4H   methocarbamol   500 mg Oral QHS   pantoprazole  (PROTONIX ) IV  40 mg Intravenous Q24H   romiPLOStim   4 mcg/kg Subcutaneous Once   simvastatin   20 mg Oral q1800   Continuous Infusions:   LOS: 5 days    Time spent: 51 minutes spent on 10/12/2023 caring for this patient face-to-face including chart review, ordering labs/tests, documenting, discussion with nursing staff, consultants, updating family and interview/physical exam    Camellia PARAS [place], DO [facility] Available via Epic secure chat 7am-7pm After these hours, please refer to coverage provider listed on [email or web address] 10/12/2023, 1:19 PM

## 2023-10-12 NOTE — Progress Notes (Addendum)
 Physical Therapy Treatment Patient Details Name: Tyler Scott MRN: [id number] DOB: 1955/10/03 Today's Date: 10/12/2023   History of Present Illness Pt sent from Cancer center to ED 2* SOB, weakness and syncope with BP of 79/42 with fall off garage steps into car with spouse.  Pt admitted with severe anemia, thrombocytopenia and hypotension.  Pt is currently undergoing chemo for CLL and has his of DM with diabetic retinopathy.    PT Comments  Pt NOT progressing as expected.  Will consult LPT as Pt is more appropriate for ST Rehab at SNF vs [facility] PT.  Per OT PN, current level, home services would be a challenge unless significant progress made prior to d/c.   Pt required much assist to get OOB and presents with limited activity tolerance and overall weakness.   Cognition Comments: AxO x 2.5 slow to respond and mild difficulty finging the right words.  Lives home with Spouse.  Following all commands with increased time.  Pleasant and willing.  Max c/o feeling weak and no energy.  C/O poor appitite because everything tastes bad like Charcole. Unsteady and and a HIGH FALL RISK. Pt will need ST Rehab at SNF to address mobility and functional decline prior to safely returning home.    If plan is discharge home, recommend the following: A little help with walking and/or transfers;A little help with bathing/dressing/bathroom;Assistance with cooking/housework;Assist for transportation;Help with stairs or ramp for entrance   Can travel by private vehicle        Equipment Recommendations  None recommended by PT    Recommendations for Other Services       Precautions / Restrictions Precautions Precautions: Fall Restrictions Weight Bearing Restrictions Per Provider Order: No     Mobility  Bed Mobility Overal bed mobility: Needs Assistance Bed Mobility: Supine to Sit     Supine to sit: Min assist     General bed mobility comments: assist for upper body and increased time to  scoot to EOB.  Max c/o fatigue.    Transfers Overall transfer level: Needs assistance Equipment used: Rolling walker (2 wheels) Transfers: Sit to/from Stand Sit to Stand: Mod assist           General transfer comment: increased time due to fatigue and weakness from elevated bed.  Unsteady.    Ambulation/Gait Ambulation/Gait assistance: Min assist, Mod assist Gait Distance (Feet): 32 Feet Assistive device: Rolling walker (2 wheels) Gait Pattern/deviations: Step-to pattern, Step-through pattern, Decreased step length - right, Decreased step length - left, Shuffle, Trunk flexed Gait velocity: decreased     General Gait Details: VERY unsteady gait with MAX c/o fatigue and weakness.  Unsteady.  HIGH FALL RISK.   Stairs             Wheelchair Mobility     Tilt Bed    Modified Rankin (Stroke Patients Only)       Balance                                            Communication Communication Communication: No apparent difficulties  Cognition   Behavior During Therapy: WFL for tasks assessed/performed, Flat affect   PT - Cognitive impairments: No apparent impairments                       PT - Cognition Comments: AxO x 2.5 slow to respond and  mild difficulty finging the right words.  Lives home with Spouse.  Following all commands with increased time.  Pleasant and willing.  Max c/o feeling weak and no energy.  C/O poor appitite because everything tastes bad like Charcole. Following commands: Intact      Cueing Cueing Techniques: Verbal cues  Exercises      General Comments        Pertinent Vitals/Pain Pain Assessment Pain Assessment: Faces Faces Pain Scale: Hurts a little bit Pain Location: back Pain Descriptors / Indicators: Constant Pain Intervention(s): Monitored during session    Home Living                          Prior Function            PT Goals (current goals can now be found in the care  plan section) Progress towards PT goals: Progressing toward goals    Frequency    Min 2X/week      PT Plan      Co-evaluation              AM-PAC PT 6 Clicks Mobility   Outcome Measure  Help needed turning from your back to your side while in a flat bed without using bedrails?: A Little Help needed moving from lying on your back to sitting on the side of a flat bed without using bedrails?: A Little Help needed moving to and from a bed to a chair (including a wheelchair)?: A Little Help needed standing up from a chair using your arms (e.g., wheelchair or bedside chair)?: A Little Help needed to walk in hospital room?: A Little Help needed climbing 3-5 steps with a railing? : A Lot 6 Click Score: 17    End of Session Equipment Utilized During Treatment: Gait belt Activity Tolerance: Patient limited by fatigue Patient left: in chair;with call bell/phone within reach;with chair alarm set Nurse Communication: Mobility status PT Visit Diagnosis: Difficulty in walking, not elsewhere classified (R26.2);Muscle weakness (generalized) (M62.81)     Time: 8591-8567 PT Time Calculation (min) (ACUTE ONLY): 24 min  Charges:    $Gait Training: 8-22 mins $Therapeutic Activity: 8-22 mins PT General Charges $$ ACUTE PT VISIT: 1 Visit                     Katheryn Leap  PTA Acute  Rehabilitation Services Office M-F          [phone]

## 2023-10-12 NOTE — Progress Notes (Signed)
 HEMATOLOGY/ONCOLOGY CLINIC NOTE  Date of Service: .10/06/2023   Patient Care Team: Clinic, [facility] Lien as PCP - General Ethyl Lonni BRAVO, MD (Inactive) Pickenpack-Cousar, Fannie SAILOR, NP as Nurse Practitioner [facility] and Palliative Medicine)  CHIEF COMPLAINTS/PURPOSE OF CONSULTATION:  Follow-up with continued valuation and management of CLL and follow-up prior to cycle 2 of Bendamustine  Rituxan   DIAGNOSIS:   Prior therapy:    Bendamustine  and rituximab  started in July 2017 and received total of 2 cycles. Therapy discontinued in August 2017 based on patient's wishes after achieving partial response.   He developed a relapse disease in April 2019. Ibrutinib  420 mg daily started in April 2019 and discontinued in September 2023 after poor adherence.   Calquence  100 mg twice daily started in September 2023.changed due to progression  Switch to Gazyva  venetoclax  but had several breaks in treatment due to epididymoorchitis patient no-shows and could not really get started on venetoclax .  Was admitted to the hospital with significant serum progression and restarted on Bendamustine  Rituxan .  HISTORY OF PRESENTING ILLNESS:  Tyler Scott is a wonderful 68 y.o. male who has been a previous patient of Dr. Amadeo. He is here for evaluation and management of CLL.   Patient was last seen by Dr. Amadeo on 02/18/2022 and was doing well overall with no new medical concerns.  Today, he reports that he has been on and off different treatment since 2017, but has been on Calquence  since September, 2023. He does admit that he does forget to take this medication a couple times a week. He reports that his previous treatment of Ibrutinib  did cause him diarrhea.  He did receive two previous injection in his buttocks, but is unsure it is was steroids or antibiotics. Patient is UTD with influenza, RSV, and COVID-19 vaccinations. He denies any abdominal pain, but does note some discomfort along  the spleen. He denies any bleeding issues, chest pain, diarrhea, resolved lumps/bumps in neck, infection issues, fevers, or chills.  He does note some SOB related to allergies and chest/sinus congestion. His symptoms are improving. He also reports that his neck edema had improved. He does complain of sleep deprivation/poor sleep habits and notes some stress. He does typically sleep during the day.  He does take vitamin D  and C supplements regularly. On average, he does drink 1 glass of wine a day and denies any major medication changes.  INTERVAL HISTORY:  Tyler Scott is a  68 y.o. male who is here for follow-up of his CLL prior to cycle 2 of Bendamustine  Rituxan . He continues to have poor p.o. intake and has yet not called the physical therapist to work with him at home on physical therapy. He appears to be getting weaker. We discussed that this will be his last cycle of Bendamustine  Rituxan  and we will be planning to switch him to venetoclax . He is still needing Nplate  weekly for platelet support. We discussed that his platelet counts are still low and we will need to monitor this very closely weekly and he may need platelet transfusion support. No fevers no chills no night sweats.  MEDICAL HISTORY:  Past Medical History:  Diagnosis Date   Allergy    Arthritis    CLL (chronic lymphocytic leukemia) (HCC)    stage III: asymptomatic; on observation   DIABETES MELLITUS, TYPE I, UNCONTROLLED 12/30/2006   insulin    Diabetic retinopathy    ERECTILE DYSFUNCTION, ORGANIC 12/30/2006   Hx of adenomatous polyp of colon 05/24/2014   HYPERLIPIDEMIA 12/30/2006  HYPERTENSION 12/30/2006   LOW BACK PAIN, ACUTE 12/21/2006    SURGICAL HISTORY: Past Surgical History:  Procedure Laterality Date   COLONOSCOPY     great right toe- Fused the I P joint      SOCIAL HISTORY: Social History   Socioeconomic History   Marital status: Married    Spouse name: Not on file   Number of children:  Not on file   Years of education: Not on file   Highest education level: Not on file  Occupational History   Not on file  Tobacco Use   Smoking status: Never   Smokeless tobacco: Never  Vaping Use   Vaping status: Never Used  Substance and Sexual Activity   Alcohol  use: Yes    Alcohol /week: 4.0 standard drinks of alcohol     Types: 4 Glasses of wine per week   Drug use: No   Sexual activity: Not on file    Comment: regular exercise - yes  Other Topics Concern   Not on file  Social History Narrative   Not on file   Social Drivers of Health   Financial Resource Strain: Not on file  Food Insecurity: No Food Insecurity (10/08/2023)   Hunger Vital Sign    Worried About Running Out of Food in the Last Year: Never true    Ran Out of Food in the Last Year: Never true  Transportation Needs: No Transportation Needs (10/08/2023)   PRAPARE - Administrator, Civil Service (Medical): No    Lack of Transportation (Non-Medical): No  Physical Activity: Not on file  Stress: Not on file  Social Connections: Unknown (10/08/2023)   Social Connection and Isolation Panel    Frequency of Communication with Friends and Family: Not on file    Frequency of Social Gatherings with Friends and Family: Not on file    Attends Religious Services: Not on file    Active Member of Clubs or Organizations: Not on file    Attends Banker Meetings: Never    Marital Status: Not on file  Recent Concern: Social Connections - Moderately Isolated (10/08/2023)   Social Connection and Isolation Panel    Frequency of Communication with Friends and Family: Three times a week    Frequency of Social Gatherings with Friends and Family: Twice a week    Attends Religious Services: Never    Database administrator or Organizations: No    Attends Engineer, structural: Not on file    Marital Status: Married  Catering manager Violence: Not At Risk (10/08/2023)   Humiliation, Afraid, Rape, and  Kick questionnaire    Fear of Current or Ex-Partner: No    Emotionally Abused: No    Physically Abused: No    Sexually Abused: No    FAMILY HISTORY: Family History  Problem Relation Age of Onset   Alcohol  abuse Other    Colon cancer Brother 61       Died 10-10-13   Diabetes Mellitus II Mother    Esophageal cancer Neg Hx    Rectal cancer Neg Hx    Stomach cancer Neg Hx     ALLERGIES:  has no known allergies.  MEDICATIONS:  No current facility-administered medications for this visit.   No current outpatient medications on file.   Facility-Administered Medications Ordered in Other Visits  Medication Dose Route Frequency Provider Last Rate Last Admin   0.9 %  sodium chloride  infusion (Manually program via Guardrails IV Fluids)   Intravenous Once  Doretha Folks, MD       acyclovir  (ZOVIRAX ) tablet 400 mg  400 mg Oral Daily [place], Eric J, DO   400 mg at 10/12/23 9061   allopurinol  (ZYLOPRIM ) tablet 150 mg  150 mg Oral Daily [place], Eric J, DO   150 mg at 10/12/23 9061   artificial tears ophthalmic solution 1 drop  1 drop Both Eyes PRN Neda, Adewale A, MD   1 drop at 10/10/23 2212   Chlorhexidine  Gluconate Cloth 2 % PADS 6 each  6 each Topical Daily Ilah Corean HERO, PA-C   6 each at 10/12/23 1630   docusate sodium  (COLACE) capsule 100 mg  100 mg Oral BID PRN Ilah Corean HERO, PA-C       doxepin  (SINEQUAN ) capsule 25 mg  25 mg Oral QHS [place], Eric J, DO   25 mg at 10/12/23 2146   feeding supplement (ENSURE PLUS HIGH PROTEIN) liquid 237 mL  237 mL Oral BID BM [place], Eric J, DO       gabapentin  (NEURONTIN ) capsule 100 mg  100 mg Oral TID [place], Eric J, DO   100 mg at 10/12/23 2147   guaiFENesin -dextromethorphan  (ROBITUSSIN DM) 100-10 MG/5ML syrup 5 mL  5 mL Oral Q4H PRN Ogan, Okoronkwo U, MD   5 mL at 10/09/23 9567   HYDROmorphone  (DILAUDID ) tablet 2 mg  2 mg Oral QHS [place], Eric J, DO   2 mg at 10/12/23 2147   insulin  aspart (novoLOG ) injection 0-9 Units  0-9 Units  Subcutaneous Q4H Ilah Corean HERO, PA-C   1 Units at 10/12/23 2119   lip balm (CARMEX) ointment   Topical PRN Kara Dorn NOVAK, MD   Given at 10/08/23 2237   methocarbamol  (ROBAXIN ) tablet 500 mg  500 mg Oral QHS [place], Eric J, DO   500 mg at 10/12/23 2146   morphine  (PF) 2 MG/ML injection 2 mg  2 mg Intravenous Q3H PRN [place], Eric J, DO   2 mg at 10/10/23 1319   ondansetron  (ZOFRAN ) injection 4 mg  4 mg Intravenous Q6H PRN Ilah Corean M, PA-C       pantoprazole  (PROTONIX ) injection 40 mg  40 mg Intravenous Q24H Kara Dorn B, MD   40 mg at 10/12/23 2146   polyethylene glycol (MIRALAX  / GLYCOLAX ) packet 17 g  17 g Oral Daily PRN Ilah Corean HERO, PA-C       simvastatin  (ZOCOR ) tablet 20 mg  20 mg Oral q1800 [place], Eric J, DO   20 mg at 10/12/23 1736   traMADol  (ULTRAM ) tablet 50 mg  50 mg Oral Q6H PRN [place], Eric J, DO   50 mg at 10/12/23 1736    REVIEW OF SYSTEMS:    .10 Point review of Systems was done is negative except as noted above.  PHYSICAL EXAMINATION: ECOG PERFORMANCE STATUS: 3 VSS . GENERAL:alert, in no acute distress and comfortable SKIN: no acute rashes, no significant lesions EYES: conjunctiva are pink and non-injected, sclera anicteric OROPHARYNX: MMM, no exudates, no oropharyngeal erythema or ulceration NECK: supple, no JVD LYMPH:  no palpable lymphadenopathy in the cervical, axillary or inguinal regions LUNGS: clear to auscultation b/l with normal respiratory effort HEART: regular rate & rhythm ABDOMEN:  normoactive bowel sounds , non tender, not distended. Extremity: no pedal edema PSYCH: alert & oriented x 3 with fluent speech NEURO: no focal motor/sensory deficits  LABORATORY DATA:  I have reviewed the data as listed . Component     Latest Ref Rng 10/06/2023  WBC  4.0 - 10.5 K/uL 17.1 (H)   RBC     4.22 - 5.81 MIL/uL 2.69 (L)   Hemoglobin     13.0 - 17.0 g/dL 8.2 (L)   HCT     60.9 - 52.0 % 24.8 (L)   MCV     80.0 - 100.0 fL  92.2   MCH     26.0 - 34.0 pg 30.5   MCHC     30.0 - 36.0 g/dL 66.8   RDW     88.4 - 84.4 % 19.6 (H)   Platelets     150 - 400 K/uL 47 (L)   nRBC     0.0 - 0.2 % 0.2   Neutrophils     % 13   NEUT#     1.7 - 7.7 K/uL 2.3   Lymphocytes     % 72   Lymphs Abs     0.7 - 4.0 K/uL 12.4 (H)   Monocytes Relative     % 12   Monocyte #     0.1 - 1.0 K/uL 2.0 (H)   Eosinophil     % 1   Eosinophils Absolute     0.0 - 0.5 K/uL 0.2   Basophil     % 1   Basophils Absolute     0.0 - 0.1 K/uL 0.1   WBC Morphology See Note   Smear Review See Note   Immature Granulocytes     % 1   Abs Immature Granulocytes     0.00 - 0.07 K/uL 0.15 (H)   Polychromasia PRESENT   Ovalocytes PRESENT   Sodium     135 - 145 mmol/L 136   Potassium     3.5 - 5.1 mmol/L 4.4   Chloride     98 - 111 mmol/L 100   CO2     22 - 32 mmol/L 27   Glucose     70 - 99 mg/dL 747 (H)   BUN     8 - 23 mg/dL 15   Creatinine     9.38 - 1.24 mg/dL 8.92   Calcium      8.9 - 10.3 mg/dL 8.1 (L)   Total Protein     6.5 - 8.1 g/dL 5.7 (L)   Albumin     3.5 - 5.0 g/dL 3.5   AST     15 - 41 U/L 26   ALT     0 - 44 U/L 12   Alkaline Phosphatase     38 - 126 U/L 166 (H)   Total Bilirubin     0.0 - 1.2 mg/dL 1.1   GFR, Est Non African American     >60 mL/min >60   Anion gap     5 - 15  9     Legend: (H) High (L) Low RADIOGRAPHIC STUDIES: I have personally reviewed the radiological images as listed and agreed with the findings in the report. CT HEAD WO CONTRAST ( ) Result Date: 10/07/2023 CLINICAL DATA:  Recent fall with headaches, initial encounter EXAM: CT HEAD WITHOUT CONTRAST TECHNIQUE: Contiguous axial images were obtained from the base of the skull through the vertex without intravenous contrast. RADIATION DOSE REDUCTION: This exam was performed according to the departmental dose-optimization program which includes automated exposure control, adjustment of the mA and/or kV according to patient size  and/or use of iterative reconstruction technique. COMPARISON:  None Available. FINDINGS: Brain: No evidence of acute infarction, hemorrhage, hydrocephalus, extra-axial collection or mass lesion/mass  effect. Mild atrophic changes are noted. Vascular: No hyperdense vessel or unexpected calcification. Skull: Normal. Negative for fracture or focal lesion. Sinuses/Orbits: No acute finding. Other: None. IMPRESSION: No acute intracranial abnormality noted. Electronically Signed   By: Oneil Devonshire M.D.   On: 10/07/2023 21:12   CT ABDOMEN PELVIS W CONTRAST Result Date: 10/07/2023 CLINICAL DATA:  Acute nonlocalized abdominal pain. History of CLL. Hypotensive [facility] with tachycardia and decreased oxygenation. EXAM: CT ABDOMEN AND PELVIS WITH CONTRAST TECHNIQUE: Multidetector CT imaging of the abdomen and pelvis was performed using the standard protocol following bolus administration of intravenous contrast. RADIATION DOSE REDUCTION: This exam was performed according to the departmental dose-optimization program which includes automated exposure control, adjustment of the mA and/or kV according to patient size and/or use of iterative reconstruction technique. CONTRAST:  OMNIPAQUE  IOHEXOL  300 MG/ML  SOLN COMPARISON:  08/06/2023 FINDINGS: Lower chest: Large left and small right pleural effusions. Basilar atelectasis or consolidation also greater on the left. Cardiac enlargement. Hepatobiliary: No focal liver abnormality is seen. No gallstones, gallbladder wall thickening, or biliary dilatation. Pancreas: Unremarkable. No pancreatic ductal dilatation or surrounding inflammatory changes. Spleen: Spleen is enlarged. Focal wedge-shaped defect in the posterior periphery likely representing splenic infarct. Adrenals/Urinary Tract: No adrenal gland nodules. Kidneys are symmetrical. No hydronephrosis or hydroureter. Bladder is normal. Stomach/Bowel: Stomach, small bowel, and colon are not abnormally distended. No wall  thickening or inflammatory infiltration. Appendix is normal. Vascular/Lymphatic: Calcification of the aorta. No aneurysm. There is extensive bulky lymphadenopathy involving the retrocrural space, celiac axis, splenic hilar region, throughout the retroperitoneum, throughout the mesentery, bilateral anterior and posterior pelvic chains, in the sciatic region, and in the groin regions. Largest retroperitoneal lymph node mass measures about 8.9 x 8.5 cm on transverse dimension. Measured at a similar level, there is no significant change since the prior study. Reproductive: Prostate is unremarkable. Other: Small amount of free fluid along the pericolic gutters. No free air. Musculoskeletal: Degenerative changes in the spine. Lucent lesions demonstrated in the sacrum and pelvis, most prominent on the right. Focal lesions measure up to about 1.7 cm diameter. These lesions are progressive since prior study. IMPRESSION: 1. Extensive bulky lymphadenopathy throughout the lower chest, abdomen, pelvis, and groin regions. Lymphadenopathy is similar to the previous study. 2. Splenic enlargement with peripheral splenic infarct. This is likely due to lymphomatous involvement. 3. Small lucent lesions in the pelvis may represent lymphomatous involvement. Bone lesions appear progressive since prior study. 4. Moderate left and small right pleural effusions with basilar atelectasis, progressing since prior study. Cardiac enlargement. Electronically Signed   By: Elsie Gravely M.D.   On: 10/07/2023 17:51   DG Chest Port 1 View Result Date: 10/07/2023 CLINICAL DATA:  Questionable sepsis. EXAM: PORTABLE CHEST 1 VIEW COMPARISON:  Chest CT dated 08/07/2023. FINDINGS: Small left pleural effusion and left lung base atelectasis or infiltrate. No pneumothorax. The cardiac silhouette is within limits. Right paratracheal density consistent with adenopathy. No acute osseous pathology. IMPRESSION: 1. Small left pleural effusion and left lung  base atelectasis or infiltrate. 2. Right paratracheal adenopathy. Electronically Signed   By: Vanetta Chou M.D.   On: 10/07/2023 15:59      ASSESSMENT & PLAN:   68 year old man with:      1.  CLL /SLL diagnosed in 2012 status post multiple lines of treatment as noted above. Currently with significant CLL progression with bulky abdominal adenopathy causing abdominal pain and back pain. Retroperitoneal adenopathy was rebiopsied to confirm SLL and rule out  large cell transformation.  2.  Anemia and thrombocytopenia likely related to CLL and poor p.o. intake.  3.  Hypercalcemia likely related to CLL progression and dehydration calcium  levels are normalized after first treatment with Bendamustine  Rituxan .  4.  Poor functional status due to poor p.o. intake.  Patient has been doing significant motivation to try to work with physical therapy.  5.  History of left-sided epididymoorchitis.  6.  Abnormal liver function tests could be from Bendamustine  or Rituxan  or Nplate . Patient has been previously negative for hepatitis B serologies but recently turned positive for hepatitis B core antibody does have B DNA is undetectable.  Will refer to ID for consideration of hep B suppressive therapy in the context of Rituxan  use though recent hep B positivity suggests possibly false positive antibody production by his underlying CLL.  PLAN: -Continues to have poor performance status ECOG PS of 3 with poor p.o. intake.  He also has not called home physical therapy to work with them yet. - Notes that he might be somewhat depressed and was recommended to call his PCP at the [facility] for referral to [facility] behavioral health to address any possible adjustment disorder/depression. - Encouraged improved p.o. intake per dietary input. Labs today 10/06/2023 CBC shows WBC count of 17 hemoglobin of 8.2 platelets of 47k CMP creatinine of 1 calcium  of 8.1. Will proceed with cycle 2 and last planned cycle of Bendamustine   Rituxan  with Bendamustine  dose reduction at 70 mg/m. Will need weekly labs and weekly Nplate  to monitor and manage number cytopenia as well as anemia. Will transition to venetoclax  plus monthly Rituxan  after this cycle of Bendamustine  Rituxan . We had a detailed goals of care discussion that if he does not focus on eating well and working with physical therapy that his performance status may preclude a lot of different treatments On V[place]  due to hepatitis B core antibody positivity pending ID consultation  FOLLOW-UP:  Plz schedule weekly labs and NPlate  x4 MD visit in 2 weeks with labs   .The total time spent in the appointment was 32 minutes* .  All of the patient's questions were answered with apparent satisfaction. The patient knows to call the clinic with any problems, questions or concerns.   Emaline Saran MD MS AAHIVMS [facility] Hematology/Oncology Physician [facility]  .*Total Encounter Time as defined by the Centers for Medicare and Medicaid Services includes, in addition to the face-to-face time of a patient visit (documented in the note above) non-face-to-face time: obtaining and reviewing outside history, ordering and reviewing medications, tests or procedures, care coordination (communications with other health care professionals or caregivers) and documentation in the medical record.

## 2023-10-13 ENCOUNTER — Inpatient Hospital Stay

## 2023-10-13 ENCOUNTER — Other Ambulatory Visit

## 2023-10-13 ENCOUNTER — Encounter: Payer: Self-pay | Admitting: Hematology

## 2023-10-13 ENCOUNTER — Telehealth: Payer: Self-pay

## 2023-10-13 ENCOUNTER — Ambulatory Visit

## 2023-10-13 ENCOUNTER — Ambulatory Visit: Admitting: Hematology

## 2023-10-13 ENCOUNTER — Other Ambulatory Visit (HOSPITAL_COMMUNITY): Payer: Self-pay

## 2023-10-13 DIAGNOSIS — E883 Tumor lysis syndrome: Secondary | ICD-10-CM | POA: Diagnosis not present

## 2023-10-13 DIAGNOSIS — D649 Anemia, unspecified: Secondary | ICD-10-CM | POA: Diagnosis not present

## 2023-10-13 DIAGNOSIS — E561 Deficiency of vitamin K: Secondary | ICD-10-CM | POA: Diagnosis not present

## 2023-10-13 DIAGNOSIS — R579 Shock, unspecified: Secondary | ICD-10-CM | POA: Diagnosis not present

## 2023-10-13 LAB — CULTURE, BLOOD (ROUTINE X 2): Culture: NO GROWTH

## 2023-10-13 LAB — CBC WITH DIFFERENTIAL/PLATELET
Abs Immature Granulocytes: 0.07 K/uL (ref 0.00–0.07)
Basophils Absolute: 0 K/uL (ref 0.0–0.1)
Basophils Relative: 1 %
Eosinophils Absolute: 0.1 K/uL (ref 0.0–0.5)
Eosinophils Relative: 2 %
HCT: 27.6 % — ABNORMAL LOW (ref 39.0–52.0)
Hemoglobin: 8.5 g/dL — ABNORMAL LOW (ref 13.0–17.0)
Immature Granulocytes: 1 %
Lymphocytes Relative: 72 %
Lymphs Abs: 4.8 K/uL — ABNORMAL HIGH (ref 0.7–4.0)
MCH: 28.8 pg (ref 26.0–34.0)
MCHC: 30.8 g/dL (ref 30.0–36.0)
MCV: 93.6 fL (ref 80.0–100.0)
Monocytes Absolute: 0.8 K/uL (ref 0.1–1.0)
Monocytes Relative: 12 %
Neutro Abs: 0.8 K/uL — ABNORMAL LOW (ref 1.7–7.7)
Neutrophils Relative %: 12 %
Platelets: 21 K/uL — CL (ref 150–400)
RBC: 2.95 MIL/uL — ABNORMAL LOW (ref 4.22–5.81)
RDW: 17.7 % — ABNORMAL HIGH (ref 11.5–15.5)
Smear Review: NORMAL
WBC: 6.7 K/uL (ref 4.0–10.5)
nRBC: 0.5 % — ABNORMAL HIGH (ref 0.0–0.2)

## 2023-10-13 LAB — MAGNESIUM: Magnesium: 1.4 mg/dL — ABNORMAL LOW (ref 1.7–2.4)

## 2023-10-13 LAB — BPAM PLATELET PHERESIS
Blood Product Expiration Date: 202509032359
ISSUE DATE / TIME: 202509020956
Unit Type and Rh: 7300

## 2023-10-13 LAB — COMPREHENSIVE METABOLIC PANEL WITH GFR

## 2023-10-13 LAB — BASIC METABOLIC PANEL WITH GFR
Anion gap: 10 (ref 5–15)
BUN: 9 mg/dL (ref 8–23)
CO2: 25 mmol/L (ref 22–32)
Calcium: 7.6 mg/dL — ABNORMAL LOW (ref 8.9–10.3)
Chloride: 106 mmol/L (ref 98–111)
Creatinine, Ser: 0.66 mg/dL (ref 0.61–1.24)
GFR, Estimated: >60 mL/min (ref >60)
Glucose, Bld: 77 mg/dL (ref 70–99)
Potassium: 3.7 mmol/L (ref 3.5–5.1)
Sodium: 141 mmol/L (ref 135–145)

## 2023-10-13 LAB — PREPARE PLATELET PHERESIS: Unit division: 0

## 2023-10-13 LAB — GLUCOSE, CAPILLARY
Glucose-Capillary: 101 mg/dL — ABNORMAL HIGH (ref 70–99)
Glucose-Capillary: 119 mg/dL — ABNORMAL HIGH (ref 70–99)
Glucose-Capillary: 79 mg/dL (ref 70–99)
Glucose-Capillary: 82 mg/dL (ref 70–99)
Glucose-Capillary: 98 mg/dL (ref 70–99)
Glucose-Capillary: 99 mg/dL (ref 70–99)

## 2023-10-13 LAB — LIPASE, BLOOD

## 2023-10-13 LAB — PROTIME-INR

## 2023-10-13 LAB — TROPONIN T, HIGH SENSITIVITY

## 2023-10-13 LAB — LACTATE DEHYDROGENASE

## 2023-10-13 MED ORDER — MAGNESIUM SULFATE 4 GM/100ML IV SOLN
4.0000 g | Freq: Once | INTRAVENOUS | Status: AC
  Administered 2023-10-13: 4 g via INTRAVENOUS
  Filled 2023-10-13: qty 100

## 2023-10-13 MED ORDER — PANTOPRAZOLE SODIUM 40 MG PO TBEC
40.0000 mg | DELAYED_RELEASE_TABLET | Freq: Every day | ORAL | Status: DC
  Administered 2023-10-13 – 2023-10-23 (×11): 40 mg via ORAL
  Filled 2023-10-13 (×11): qty 1

## 2023-10-13 MED ORDER — VENETOCLAX 10 & 50 & 100 MG PO TBPK: ORAL_TABLET | ORAL | 0 refills | Status: DC

## 2023-10-13 NOTE — Progress Notes (Signed)
 PROGRESS NOTE    Tyler Scott  FMW:[id number] DOB: 08-15-1955 DOA: 10/07/2023 PCP: [facility]   Brief Narrative:  Tyler Scott is a 68 y.o. male with past medical history significant for CLL with recent initiation of chemotherapy, HTN, HLD, DM 1, chronic ITP (baseline Plt 40's) who presented to [facility] ED from the cancer center with complaints of abdominal pain, nausea, poor oral intake, generalized weakness with near syncope and shortness of breath.  Patient was due for next chemotherapy infusion today, 8/28 and when attempting to get to the car had an episode of near syncope with associated shortness of breath and generalized weakness.  EMS was activated in which she was noted to be tachycardic, hypotensive with mild AMS (improved shortly after arrival).  Patient's wife also noted hyperglycemia to the 300s the morning of 10/07/2023 in which she administered insulin .   On ED arrival, patient was afebrile with HR 116, BP 95/67, RR 25, SpO2 94%. Labs were notable for WBC 14.6 (down from 17.1 8/27 with ANC 1100), Hgb 1.7 (8.2), Plt 10 (47). INR > 10 (1 two months PTA). CMP/lipase pending. Trop 46. LA 2.5 > 5.4. VBG 7.21/pCO2 < 18/pO2 < 31/bicarb 5.6. Initial CBG 40. CXR demonstrated small L pleural effusion and L lung base atelectasis with R paratracheal adenoapthyp; CT A/P showed extensive bulky LAD throughout the lower chest, abdomen, pelvis and groin regions (similar from prior), splenic enlargement with peripheral splenic infarct, small lucent lesions in the pelvis c/f lymphomatous involvement, moderate L and small R pleural effusions with atelectasis. Broad-spectrum antibiotics were started, Cx pending. Levophed  started for hypotension. Oncology consulted. PCCM consulted for ICU admission.   Significant hospital events: 8/28: Admit ICU, started on vasopressors, oncology consulted; transfused 2 unit PRBC 8/29: Levophed  titrated off; received rasburicase , 1 unit platelets, 1 unit  PRBC transfusion 8/30: 1 unit platelet transfusion 8/31: Transferred to [facility]; 2 unit platelet transfusion 9/1: 1u platelet transfusion, transfer to 6 E. 9/2: transfuse 1u Plt, to receive NPLATE  per oncology  9/3: SNF Selected  Assessment and Plan:  Shock, undifferentiated Concern for sepsis, ruled out Patient presenting with weakness and noted to be hypotensive, initially admitted to the intensive care unit on vasopressors.  Suspect etiology likely secondary to severe dehydration, poor oral intake with recent chemotherapy infusion as well as tumor lysis syndrome as above.  Initially was started on empiric antibiotics with vancomycin , cefepime  and doxycycline  which were stopped.   Acute hypoxemic respiratory failure: Resolved; Supplemental oxygen weaned off.   CLL/SLL/low-grade lymphoma Tumor lysis syndrome Patient initially diagnosed with CLL 2012. Most recently patient received Cycle 1 Bendamustine  (without Rituxan ) on 09/08/23.  Now he is status post cycle 2 which was started on 10/06/2023.  Imaging 8/29 with extensive bulky lymphadenopathy throughout the lower chest, abdomen, pelvis and groin similar to previous study also with splenic enlargement, noted bone lesions progressive since prior study.  Uric acid 12.3, received rasburicase  on 8/28. -- Uric acid 12.3>5.1..<1.5 -- Further per oncology and the plan is for Venetoclax  + Rituxan  the patient's platelet counts are better.  Will continue physical therapy efforts and dietary efforts while he is hospitalized.   Acute renal failure: Resolved; Suspect etiology multifactorial with ATN from hypotension/shock as well as prerenal azotemia from severe dehydration.  No hydronephrosis noted on CT abdomen/pelvis at time of admission.  IV fluids now discontinued. BUN/Cr Trend: Recent Labs  Lab 10/07/23 2341 10/08/23 0725 10/09/23 0315 10/10/23 0345 10/11/23 0330 10/12/23 0535 10/13/23 0554  BUN 44* 46* 36*  20 14 10 9   CREATININE 2.36* 2.31*  1.58* 0.92 0.76 0.78 0.66  -Avoid Nephrotoxic Medications, Contrast Dyes, Hypotension and Dehydration to Ensure Adequate Renal Perfusion and will need to Renally Adjust Meds -Continue to Monitor and Trend Renal Function carefully and repeat CMP in the AM    Anemia, severe: Hemoglobin on arrival 1.7; transfused 3 unit PRBCs. Hgb/Hct Trend: Recent Labs  Lab 10/09/23 0315 10/09/23 1609 10/10/23 0345 10/10/23 1447 10/11/23 0330 10/12/23 0535 10/13/23 0554  HGB 9.3* 10.0* 8.7* 10.4* 8.7* 9.8* 8.5*  HCT 28.4* 30.7* 27.3* 33.4* 26.7* 30.6* 27.6*  MCV 90.4 90.8 92.2 93.3 93.0 91.3 93.6  -Transfuse for hemoglobin less than 7.0. CTM for S/Sx of Bleeding; No overt bleeding noted. Repeat CBC in the AM   Thrombocytopenia, severe Platelet count 10K arrival.  Received 1 unit platelet on 8/29, 8/30, 2 u on 8/31, 1u 9/1 -- Plt 10>15>18>>14>18>15>27>18 -> 21 -- Transfuse 1 unit platelets 9/2 -- NPLATE  ordered by oncology 10/12/23 and recommending 4 mcg/kg -- Repeat CBC 2 hours following transfusion -- transfuse for counts <20K or <50K with bleeding  -- CBC with diff daily; oncology would like platelet counts 50K or above before stable for discharge -CTM closely for bleeding   Hypokalemia: K+ was 3.7. CTM and Replete as Necessary. Repeat CMP in the AM  Hypomagnesemia: Mag Level was 1.4. Replete w/ IV Mag Sulfate 4 grams. CTM and Replete as Necessary. Repeat Mag Level in the AM   Essential HTN: Patient was notably hypotensive/shock as above at time of arrival but this is improved. CTM BP per protocol. Last BP reading was 143/64  HLD:  C/w Simvastatin  20 mg p.o. daily   Type 1 diabetes mellitus On Jardiance  12.5 mg p.o. daily, Lantus  10-30 units [place] Prn at bedtime at baseline -- Sensitive SSI for coverage   Chronic pain: Patient reportedly on MS Contin  15 mg p.o. nightly, gabapentin  100 mg p.o. 3 times daily Dilaudid  2 mg p.o. nightly Robaxin  500 mg p.o. nightly -- Hold home MS Contin  -- Continue  gabapentin  100 mg PO TID -- Continue Dialudid 2 mg PO at bedtime -- Continue Robaxin  500 mg PO qHS   Weakness/debility/deconditioning/gait disturbance: -- PT currently recommending SNF now to address mobility and functional Decline  -- Spouse reports unable to care for patient currently at home; Tyler Scott consulted    DVT prophylaxis: SCDs Start: 10/07/23 1835    Code Status: Full Code Family Communication: No family present @ bedside  Disposition Plan:  Level of care: Telemetry Status is: Inpatient Remains inpatient appropriate because: Needs SNF   Consultants:  Medical Oncology PCCM  Procedures:  As delineated as above  Antimicrobials:  Anti-infectives (From admission, onward)    Start     Dose/Rate Route Frequency Ordered Stop   10/10/23 1000  acyclovir  (ZOVIRAX ) tablet 400 mg        400 mg Oral Daily 10/10/23 0823     10/07/23 2000  doxycycline  (VIBRAMYCIN ) 100 mg in sodium chloride  0.9 % 250 mL IVPB  Status:  Discontinued        100 mg 125 mL/hr over 120 Minutes Intravenous Every 12 hours 10/07/23 1849 10/09/23 1140   10/07/23 1700  vancomycin  (VANCOREADY) IVPB 1250 mg/250 mL        1,250 mg 166.7 mL/hr over 90 Minutes Intravenous  Once 10/07/23 1625 10/07/23 1919   10/07/23 1630  ceFEPIme  (MAXIPIME ) 2 g in sodium chloride  0.9 % 100 mL IVPB        2  g 200 mL/hr over 30 Minutes Intravenous  Once 10/07/23 1625 10/07/23 1728       Subjective: Seen and examined at bedside and was feeling weak and was eating his lunch.  No nausea or vomiting.  Denies any chest pain or shortness of breath.  No bleeding noted.  No other concerns or complaint at this time.  Objective: Vitals:   10/12/23 1317 10/12/23 2006 10/13/23 0454 10/13/23 1338  BP: 133/62 131/70 138/64 (!) 143/64  Pulse: 95 97 95 91  Resp: 16 18 18 16   Temp: 98.2 F (36.8 C) 98.6 F (37 C) 98.4 F (36.9 C) 98.4 F (36.9 C)  TempSrc: Oral Oral Oral Oral  SpO2: 98% 99% 97% 98%  Weight:      Height:         Intake/Output Summary (Last 24 hours) at 10/13/2023 1852 Last data filed at 10/13/2023 1836 Gross per 24 hour  Intake 580 ml  Output 200 ml  Net 380 ml   Filed Weights   10/10/23 0700 10/11/23 0500 10/12/23 0706  Weight: 60 kg 63.3 kg 62.2 kg   Examination: Physical Exam:  Constitutional: Thin African-American male in no acute distress Respiratory: Diminished to auscultation bilaterally, no wheezing, rales, rhonchi or crackles. Normal respiratory effort and patient is not tachypenic. No accessory muscle use.  Unlabored breathing Cardiovascular: RRR, no murmurs / rubs / gallops. S1 and S2 auscultated. No extremity edema. Abdomen: Soft, non-tender, non-distended. Bowel sounds positive.  GU: Deferred. Musculoskeletal: No clubbing / cyanosis of digits/nails. No joint deformity upper and lower extremities.  Skin: No rashes, lesions, ulcers on limited skin evaluation. No induration; Warm and dry.  Neurologic: CN 2-12 grossly intact with no focal deficits. Romberg sign and cerebellar reflexes not assessed.  Psychiatric: Normal judgment and insight.  Is awake and alert  Data Reviewed: I have personally reviewed following labs and imaging studies  CBC: Recent Labs  Lab 10/10/23 0345 10/10/23 1447 10/11/23 0330 10/12/23 0535 10/13/23 0554  WBC 8.4 14.1* 8.7 9.1 6.7  NEUTROABS 1.2* 1.8 1.0* 0.9* 0.8*  HGB 8.7* 10.4* 8.7* 9.8* 8.5*  HCT 27.3* 33.4* 26.7* 30.6* 27.6*  MCV 92.2 93.3 93.0 91.3 93.6  PLT 15* 27* 18* 18* 21*   Basic Metabolic Panel: Recent Labs  Lab 10/07/23 2151 10/07/23 2341 10/08/23 0725 10/08/23 1349 10/09/23 0315 10/10/23 0345 10/11/23 0330 10/12/23 0535 10/13/23 0554  NA 134* 132* 135  --  138 137 138 141 141  K 6.4* 7.3* 4.8   < > 4.1 3.6 3.3* 3.9 3.7  CL 97* 99 98  --  104 106 105 107 106  CO2 21* 18* 21*  --  20* 21* 23 24 25   GLUCOSE 45* 51* 76  --  84 117* 101* 102* 77  BUN 42* 44* 46*  --  36* 20 14 10 9   CREATININE 2.48* 2.36* 2.31*  --  1.58*  0.92 0.76 0.78 0.66  CALCIUM  6.1* 5.9* 5.8*  --  6.1* 7.0* 7.2* 7.7* 7.6*  MG 2.4 2.3 2.4  --   --   --   --  1.5* 1.4*  PHOS 8.3* 7.7* 7.5*  --   --   --   --   --   --    < > = values in this interval not displayed.   GFR: Estimated Creatinine Clearance: 78.8 mL/min (by C-G formula based on SCr of 0.66 mg/dL). Liver Function Tests: Recent Labs  Lab 10/07/23 1539 10/07/23 2151 10/07/23 2341 10/08/23 0725 10/10/23  0345  AST SPECIMEN DILUTED 132* 130* 118* 21  ALT SPECIMEN DILUTED 36 36 43 20  ALKPHOS SPECIMEN DILUTED 160* 148* 131* 109  BILITOT SPECIMEN DILUTED 1.3* 1.1 1.2 1.2  PROT SPECIMEN DILUTED 5.4* 4.9* 4.7* 4.5*  ALBUMIN SPECIMEN DILUTED 3.4* 3.1* 2.9* 2.9*   Recent Labs  Lab 10/07/23 1539  LIPASE SPECIMEN DILUTED   No results for input(s): AMMONIA in the last 168 hours. Coagulation Profile: Recent Labs  Lab 10/07/23 1539 10/07/23 2151 10/07/23 2341 10/08/23 0725 10/08/23 1027  INR SPECIMEN DILUTED 2.1* 2.1* 2.0* 1.9*   Cardiac Enzymes: No results for input(s): CKTOTAL, CKMB, CKMBINDEX, TROPONINI in the last 168 hours. BNP (last 3 results) No results for input(s): PROBNP in the last 8760 hours. HbA1C: No results for input(s): HGBA1C in the last 72 hours. CBG: Recent Labs  Lab 10/12/23 2345 10/13/23 0456 10/13/23 0732 10/13/23 1206 10/13/23 1725  GLUCAP 112* 82 79 99 98   Lipid Profile: No results for input(s): CHOL, HDL, LDLCALC, TRIG, CHOLHDL, LDLDIRECT in the last 72 hours. Thyroid  Function Tests: No results for input(s): TSH, T4TOTAL, FREET4, T3FREE, THYROIDAB in the last 72 hours. Anemia Panel: No results for input(s): VITAMINB12, FOLATE, FERRITIN, TIBC, IRON, RETICCTPCT in the last 72 hours. Sepsis Labs: Recent Labs  Lab 10/07/23 1541 10/07/23 1751 10/07/23 2341  LATICACIDVEN 2.5* 5.4* 2.3*    Recent Results (from the past 240 hours)  Blood Culture (routine x 2)     Status: Abnormal    Collection Time: 10/07/23  3:39 PM   Specimen: BLOOD RIGHT ARM  Result Value Ref Range Status   Specimen Description   Final    BLOOD RIGHT ARM Performed at [facility], [address] [place], [place] [address]    Special Requests   Final    BOTTLES DRAWN AEROBIC ONLY Blood Culture results may not be optimal due to an inadequate volume of blood received in culture bottles Performed at [facility], [address] [place], [place] [address]    Culture  Setup Time   Final    GRAM POSITIVE COCCI IN CLUSTERS AEROBIC BOTTLE ONLY CRITICAL RESULT CALLED TO, READ BACK BY AND VERIFIED WITH: PHARMD CHRISTINE SHADE ON 10/08/23 @ 1501 BY DRT    Culture (A)  Final    STAPHYLOCOCCUS HOMINIS THE SIGNIFICANCE OF ISOLATING THIS ORGANISM FROM A SINGLE SET OF BLOOD CULTURES WHEN MULTIPLE SETS ARE DRAWN IS UNCERTAIN. PLEASE NOTIFY THE MICROBIOLOGY DEPARTMENT WITHIN ONE WEEK IF SPECIATION AND SENSITIVITIES ARE REQUIRED. Performed at [facility], [address] [place], [place] [address]    Report Status 10/10/2023 FINAL  Final  Blood Culture ID Panel (Reflexed)     Status: Abnormal   Collection Time: 10/07/23  3:39 PM  Result Value Ref Range Status   Enterococcus faecalis NOT DETECTED NOT DETECTED Final   Enterococcus Faecium NOT DETECTED NOT DETECTED Final   Listeria monocytogenes NOT DETECTED NOT DETECTED Final   Staphylococcus species DETECTED (A) NOT DETECTED Final    Comment: CRITICAL RESULT CALLED TO, READ BACK BY AND VERIFIED WITH: PHARMD CHRISTINE SHADE ON 10/08/23 @ 1501 BY DRT    Staphylococcus aureus (BCID) NOT DETECTED NOT DETECTED Final   Staphylococcus epidermidis NOT DETECTED NOT DETECTED Final   Staphylococcus lugdunensis NOT DETECTED NOT DETECTED Final   Streptococcus species NOT DETECTED NOT DETECTED Final   Streptococcus agalactiae NOT DETECTED NOT DETECTED Final   Streptococcus pneumoniae NOT DETECTED NOT DETECTED Final   Streptococcus pyogenes  NOT DETECTED NOT DETECTED Final   A.calcoaceticus-baumannii NOT DETECTED NOT DETECTED Final   Bacteroides fragilis NOT DETECTED NOT DETECTED Final   Enterobacterales NOT DETECTED NOT DETECTED Final   Enterobacter cloacae complex NOT DETECTED NOT DETECTED Final   Escherichia coli NOT DETECTED NOT DETECTED Final   Klebsiella aerogenes NOT DETECTED NOT DETECTED Final   Klebsiella oxytoca NOT DETECTED NOT DETECTED Final   Klebsiella pneumoniae NOT DETECTED NOT DETECTED Final   Proteus species NOT DETECTED NOT DETECTED Final   Salmonella species NOT DETECTED NOT DETECTED Final   Serratia marcescens NOT DETECTED NOT DETECTED Final   Haemophilus influenzae NOT DETECTED NOT DETECTED Final   Neisseria meningitidis NOT DETECTED NOT DETECTED Final   Pseudomonas aeruginosa NOT DETECTED NOT DETECTED Final   Stenotrophomonas maltophilia NOT DETECTED NOT DETECTED Final   Candida albicans NOT DETECTED NOT DETECTED Final   Candida auris NOT DETECTED NOT DETECTED Final   Candida glabrata NOT DETECTED NOT DETECTED Final   Candida krusei NOT DETECTED NOT DETECTED Final   Candida parapsilosis NOT DETECTED NOT DETECTED Final   Candida tropicalis NOT DETECTED NOT DETECTED Final   Cryptococcus neoformans/gattii NOT DETECTED NOT DETECTED Final    Comment: Performed at [facility], [address], [place], [place] [address]  Respiratory (~20 pathogens) panel by PCR     Status: None   Collection Time: 10/07/23  7:47 PM   Specimen: Nasopharyngeal Swab; Respiratory  Result Value Ref Range Status   Adenovirus NOT DETECTED NOT DETECTED Final   Coronavirus 229E NOT DETECTED NOT DETECTED Final    Comment: (NOTE) The Coronavirus on the Respiratory Panel, DOES NOT test for the novel  Coronavirus (2019 nCoV)    Coronavirus HKU1 NOT DETECTED NOT DETECTED Final   Coronavirus NL63 NOT DETECTED NOT DETECTED Final   Coronavirus OC43 NOT DETECTED NOT DETECTED Final   Metapneumovirus NOT DETECTED NOT DETECTED Final    Rhinovirus / Enterovirus NOT DETECTED NOT DETECTED Final   Influenza A NOT DETECTED NOT DETECTED Final   Influenza B NOT DETECTED NOT DETECTED Final   Parainfluenza Virus 1 NOT DETECTED NOT DETECTED Final   Parainfluenza Virus 2 NOT DETECTED NOT DETECTED Final   Parainfluenza Virus 3 NOT DETECTED NOT DETECTED Final   Parainfluenza Virus 4 NOT DETECTED NOT DETECTED Final   Respiratory Syncytial Virus NOT DETECTED NOT DETECTED Final   Bordetella pertussis NOT DETECTED NOT DETECTED Final   Bordetella Parapertussis NOT DETECTED NOT DETECTED Final   Chlamydophila pneumoniae NOT DETECTED NOT DETECTED Final   Mycoplasma pneumoniae NOT DETECTED NOT DETECTED Final    Comment: Performed at [facility], [address] [place], [place] [address]  Resp panel by RT-PCR (RSV, Flu A&B, Covid) Anterior Nasal Swab     Status: None   Collection Time: 10/07/23  7:47 PM   Specimen: Anterior Nasal Swab  Result Value Ref Range Status   SARS Coronavirus 2 by RT PCR NEGATIVE NEGATIVE Final    Comment: (NOTE) SARS-CoV-2 target nucleic acids are NOT DETECTED.  The SARS-CoV-2 RNA is generally detectable in upper respiratory specimens during the acute phase of infection. The lowest concentration of SARS-CoV-2 viral copies this assay can detect is 138 copies/mL. A negative result does not preclude SARS-Cov-2 infection and should not be used as the sole basis for treatment or other patient management decisions. A negative result may occur with  improper specimen collection/handling, submission of specimen other than nasopharyngeal swab, presence of viral mutation(s) within the areas targeted by  this assay, and inadequate number of viral copies(<138 copies/mL). A negative result must be combined with clinical observations, patient history, and epidemiological information. The expected result is Negative.  Fact Sheet for Patients:  BloggerCourse.com  Fact Sheet for Healthcare  Providers:  SeriousBroker.it  This test is no t yet approved or cleared by the [place]  FDA and  has been authorized for detection and/or diagnosis of SARS-CoV-2 by FDA under an Emergency Use Authorization (EUA). This EUA will remain  in effect (meaning this test can be used) for the duration of the COVID-19 declaration under Section 564(b)(1) of the Act, 21 U.S.C.section 360bbb-3(b)(1), unless the authorization is terminated  or revoked sooner.       Influenza A by PCR NEGATIVE NEGATIVE Final   Influenza B by PCR NEGATIVE NEGATIVE Final    Comment: (NOTE) The Xpert Xpress SARS-CoV-2/FLU/RSV plus assay is intended as an aid in the diagnosis of influenza from Nasopharyngeal swab specimens and should not be used as a sole basis for treatment. Nasal washings and aspirates are unacceptable for Xpert Xpress SARS-CoV-2/FLU/RSV testing.  Fact Sheet for Patients: BloggerCourse.com  Fact Sheet for Healthcare Providers: SeriousBroker.it  This test is not yet approved or cleared by the [place]  FDA and has been authorized for detection and/or diagnosis of SARS-CoV-2 by FDA under an Emergency Use Authorization (EUA). This EUA will remain in effect (meaning this test can be used) for the duration of the COVID-19 declaration under Section 564(b)(1) of the Act, 21 U.S.C. section 360bbb-3(b)(1), unless the authorization is terminated or revoked.     Resp Syncytial Virus by PCR NEGATIVE NEGATIVE Final    Comment: (NOTE) Fact Sheet for Patients: BloggerCourse.com  Fact Sheet for Healthcare Providers: SeriousBroker.it  This test is not yet approved or cleared by the [place]  FDA and has been authorized for detection and/or diagnosis of SARS-CoV-2 by FDA under an Emergency Use Authorization (EUA). This EUA will remain in effect (meaning this test can be  used) for the duration of the COVID-19 declaration under Section 564(b)(1) of the Act, 21 U.S.C. section 360bbb-3(b)(1), unless the authorization is terminated or revoked.  Performed at [facility], [address] [place], [place] [address]   Blood Culture (routine x 2)     Status: None   Collection Time: 10/07/23  9:51 PM   Specimen: BLOOD LEFT ARM  Result Value Ref Range Status   Specimen Description   Final    BLOOD LEFT ARM Performed at [facility], [address], [place], [place] [address]    Special Requests   Final    BOTTLES DRAWN AEROBIC AND ANAEROBIC Blood Culture results may not be optimal due to an inadequate volume of blood received in culture bottles Performed at [facility], [address], [place], [place] [address]    Culture   Final    NO GROWTH 5 DAYS Performed at [facility], [address], [place], [place] [address]    Report Status 10/13/2023 FINAL  Final  MRSA Next Gen by PCR, Nasal     Status: None   Collection Time: 10/07/23 10:01 PM   Specimen: Nasal Mucosa; Nasal Swab  Result Value Ref Range Status   MRSA by PCR Next Gen NOT DETECTED NOT DETECTED Final    Comment: (NOTE) The GeneXpert MRSA Assay (FDA approved for NASAL specimens only), is one component of a comprehensive MRSA colonization surveillance program. It is not intended to diagnose MRSA infection nor to  guide or monitor treatment for MRSA infections. Test performance is not FDA approved in patients less than 5 years old. Performed at [facility], [address], [place], [place] [address]     Radiology Studies: No results found.  Scheduled Meds:  sodium chloride    Intravenous Once   acyclovir   400 mg Oral Daily   allopurinol   150 mg Oral Daily   Chlorhexidine  Gluconate Cloth  6 each Topical Daily   doxepin   25 mg Oral QHS   feeding supplement  237 mL Oral BID BM   gabapentin   100 mg Oral TID    HYDROmorphone   2 mg Oral QHS   insulin  aspart  0-9 Units Subcutaneous Q4H   methocarbamol   500 mg Oral QHS   pantoprazole   40 mg Oral QHS   simvastatin   20 mg Oral q1800   Continuous Infusions:   LOS: 6 days   Alejandro Marker, DO [facility] Available via Epic secure chat 7am-7pm After these hours, please refer to coverage provider listed on [email or web address] 10/13/2023, 6:52 PM

## 2023-10-13 NOTE — Plan of Care (Signed)
  Problem: Coping: Goal: Ability to adjust to condition or change in health will improve Outcome: Progressing   Problem: Fluid Volume: Goal: Ability to maintain a balanced intake and output will improve Outcome: Progressing   Problem: Metabolic: Goal: Ability to maintain appropriate glucose levels will improve Outcome: Progressing   Problem: Nutritional: Goal: Maintenance of adequate nutrition will improve Outcome: Progressing   Problem: Skin Integrity: Goal: Risk for impaired skin integrity will decrease Outcome: Progressing   Problem: Clinical Measurements: Goal: Ability to maintain clinical measurements within normal limits will improve Outcome: Progressing Goal: Diagnostic test results will improve Outcome: Progressing Goal: Respiratory complications will improve Outcome: Progressing Goal: Cardiovascular complication will be avoided Outcome: Progressing

## 2023-10-13 NOTE — Progress Notes (Signed)
 Occupational Therapy Treatment Patient Details Name: Tyler Scott MRN: [id number] DOB: 06-29-1955 Today's Date: 10/13/2023   History of present illness Pt sent from Cancer center to ED 2* SOB, weakness and syncope with BP of 79/42 with fall off garage steps into car with spouse.  Pt admitted with severe anemia, thrombocytopenia and hypotension.  Pt is currently undergoing chemo for CLL and has his of DM with diabetic retinopathy.   OT comments  Patient seen for skilled OT session this afternoon. Patient with training for OOB for BADL's and functional mobility with improved overall activity tolerance with assistance, cues for energy conservation and RW use. Continues with processing delays and cues for pacing due to fatigue but motivated and open to all therapy presented.  Patient requires continued Acute care hospital level OT services to progress safety and functional performance and allow for discharge. Patient will benefit from continued inpatient follow up therapy, <3 hours/day.         If plan is discharge home, recommend the following:  Two people to help with walking and/or transfers;A lot of help with bathing/dressing/bathroom;Assistance with cooking/housework;Direct supervision/assist for medications management;Direct supervision/assist for financial management;Assist for transportation;Help with stairs or ramp for entrance;Supervision due to cognitive status   Equipment Recommendations  None recommended by OT       Precautions / Restrictions Precautions Precautions: Fall Restrictions Weight Bearing Restrictions Per Provider Order: No       Mobility Bed Mobility Overal bed mobility: Needs Assistance Bed Mobility: Supine to Sit     Supine to sit: Contact guard     General bed mobility comments: increased time and min cues    Transfers Overall transfer level: Needs assistance Equipment used: Rolling walker (2 wheels) Transfers: Sit to/from Stand, Bed to  chair/wheelchair/BSC Sit to Stand: Mod assist, From elevated surface     Step pivot transfers: Mod assist, From elevated surface     General transfer comment: STS several times for mobility and hygiene     Balance Overall balance assessment: Needs assistance Sitting-balance support: No upper extremity supported, Feet supported Sitting balance-Leahy Scale: Fair     Standing balance support: Bilateral upper extremity supported Standing balance-Leahy Scale: Poor                             ADL either performed or assessed with clinical judgement   ADL Overall ADL's : Needs assistance/impaired Eating/Feeding: Set up   Grooming: Wash/dry hands;Wash/dry face;Oral care;Set up;Sitting   Upper Body Bathing: Contact guard assist;Sitting   Lower Body Bathing: Moderate assistance;Sit to/from stand   Upper Body Dressing : Contact guard assist;Sitting   Lower Body Dressing: Moderate assistance;Maximal assistance;Sit to/from stand;Cueing for safety;Cueing for sequencing   Toilet Transfer: Minimal assistance;Moderate assistance;Rolling walker (2 wheels)   Toileting- Clothing Manipulation and Hygiene: Moderate assistance;Maximal assistance;Sit to/from stand Toileting - Clothing Manipulation Details (indicate cue type and reason): condom catheter now in place for bladder management     Functional mobility during ADLs: Moderate assistance;Rolling walker (2 wheels) General ADL Comments: improved this session with EOB, brief standing withRW support and recliner level ADL's    Extremity/Trunk Assessment Upper Extremity Assessment Upper Extremity Assessment: Generalized weakness;Right hand dominant   Lower Extremity Assessment Lower Extremity Assessment: Generalized weakness                 Communication Communication Communication: No apparent difficulties   Cognition Arousal: Alert Behavior During Therapy: WFL for tasks assessed/performed, Flat affect  Cognition:  Cognition impaired   Orientation impairments: Situation Awareness: Intellectual awareness impaired, Online awareness impaired Memory impairment (select all impairments): Short-term memory Attention impairment (select first level of impairment): Selective attention Executive functioning impairment (select all impairments): Reasoning, Problem solving OT - Cognition Comments: intermittent delayed processing                 Following commands: Intact        Cueing   Cueing Techniques: Verbal cues        General Comments BP post session 141/67 up in chair    Pertinent Vitals/ Pain       Pain Assessment Pain Assessment: Faces Faces Pain Scale: Hurts a little bit Pain Location: back Pain Descriptors / Indicators: Aching Pain Intervention(s): Monitored during session, Relaxation, Repositioned   Frequency  Min 2X/week        Progress Toward Goals  OT Goals(current goals can now be found in the care plan section)  Progress towards OT goals: Progressing toward goals  Acute Rehab OT Goals Patient Stated Goal: to get to rehab OT Goal Formulation: With patient Time For Goal Achievement: 10/25/23 Potential to Achieve Goals: Fair ADL Goals Pt Will Perform Lower Body Bathing: with contact guard assist;sit to/from stand Pt Will Perform Lower Body Dressing: with min assist;sit to/from stand Pt Will Transfer to Toilet: with contact guard assist;regular height toilet;grab bars;ambulating Pt Will Perform Toileting - Clothing Manipulation and hygiene: with contact guard assist;sit to/from stand Pt Will Perform Tub/Shower Transfer: Shower transfer;rolling walker;grab bars;shower seat Pt/caregiver will Perform Home Exercise Program: Increased strength;With written HEP provided;Both right and left upper extremity;Independently Additional ADL Goal #1: Patient will teach back 3/5 ECT's with BADL's and mobility with min cues  Plan         AM-PAC OT 6 Clicks Daily Activity      Outcome Measure   Help from another person eating meals?: A Little Help from another person taking care of personal grooming?: A Little Help from another person toileting, which includes using toliet, bedpan, or urinal?: A Lot Help from another person bathing (including washing, rinsing, drying)?: A Lot Help from another person to put on and taking off regular upper body clothing?: A Little Help from another person to put on and taking off regular lower body clothing?: A Lot 6 Click Score: 15    End of Session Equipment Utilized During Treatment: Gait belt;Rolling walker (2 wheels)  OT Visit Diagnosis: Unsteadiness on feet (R26.81);Muscle weakness (generalized) (M62.81);History of falling (Z91.81);Cognitive communication deficit (R41.841);Pain Pain - part of body:  (back)   Activity Tolerance Patient tolerated treatment well   Patient Left in chair;with call bell/phone within reach;with chair alarm set   Nurse Communication Mobility status        Time: 8554-8485 OT Time Calculation (min): 29 min  Charges: OT General Charges $OT Visit: 1 Visit OT Treatments $Therapeutic Activity: 23-37 mins Cloteal Isaacson OT/L Acute Rehabilitation Department  [phone]  10/13/2023, 4:17 PM

## 2023-10-13 NOTE — Progress Notes (Addendum)
 Tyler Scott   DOB:04/09/1955   FM#:[id number]      ASSESSMENT & PLAN:  Tyler Scott. Tyler Scott is a 68 year old male patient with oncologic history significant for CLL. Patient admitted overnight 8/28 with complaints of generalized weakness, SOB, nausea, and near-syncope. Status post fall at home. Oncology following closely.   CLL/SLL/Low grade Lymphoma -- Diagnosed with CLL in 2012 -- Patient is s/p multiple lines of therapy including bendamustine , rituximab , ibrutinib , and calquence . -- Previously many interrupted cycles of therapy due to no shows for appointments. -- Received Cycle 1 Bendamustine  (without Rituxan ) on 09/08/23. Patient was seen for follow up and toxicity check in outpatient oncology on 09/22/23. C2 started on 8/27.  -- Imaging done 8/28 shows extensive bulky lymphadenopathy throughout lower chest, abdomen, pelvis, and groin; similar to previous study. Also shows splenic enlargement. Bone lesions progressive since prior study.  -- Plan is for Venetoclax  + Rituxan  once PLt counts better. Venetoclax  -- prescription sent -- plan to start in 2-3 weeks as outpatient.  -- Continue PT and Dietician inputs -- Patient's wife requesting discharge to SNF as she is unable to care for him in his current weakened state. -- Medical Oncology/Dr. Onesimo following closely.   Anemia, severe --Hemoglobin on arrival documented 1.7, however may have been inaccurate.  Patient received 3 units PRBC.   -- Hemoglobin noted with decrease although may be related to increasing amounts of IV fluids patient has been receiving.  8.5 today.  No transfusion required at this time. -- Recommend PRBC transfusion for HGB<7.0 -- continue to monitor CBC with differential   Thrombocytopenia, severe -- Platelets 10K on arrival -- Platelets with slight improvement to 21K today.  Status post one unit platelets given yesterday.   -- Recommend transfusion for counts <20K or <50K with bleeding -- Platelets typically  nadir at day 12-14 post Benda received 8/27... although he received <50% of dose.    -- On weekly Nplate  4 mcg/kg, continue weekly to maintain PLT count of >50k  -- Monitor closely for bleeding   Electrolyte Imbalance -- Potassium noted <2.0 on arrival and increased to 7.3 post repletion. -- K+ currently stable 3.7 -- Monitor closely and replete electrolytes   Generalized weakness Shortness of breath with near syncope -- Encourage patient to eat to optimize his nutrition -- No shortness of breath noted -- Continue good hydration -- Continue supportive care  Depression -- Recommend Psych eval.  Patient will need to show up for oncologic therapy upon discharge.  Has shown some non-compliance although verbalizes (today as well) that he wants to continue with full scope of care.  Discussed need for Psych eval with Dr. Sherrill who will request.      Code Status Full   Subjective:  Patient seen awake and alert laying in bed.  Reports that he is okay.  Denies shortness of breath, states he is waiting for assistance with hygiene care before he gets up and moves around.  Discussed with patient if he would like to talk with Psych and he confirms he would like to, states this whole thing is not an easy pill to swallow. No other acute distress noted.   Objective:   Intake/Output Summary (Last 24 hours) at 10/13/2023 1222 Last data filed at 10/12/2023 1313 Gross per 24 hour  Intake 616 ml  Output --  Net 616 ml     PHYSICAL EXAMINATION: ECOG PERFORMANCE STATUS: 3 - Symptomatic, >50% confined to bed  Vitals:   10/12/23 2006 10/13/23 0454  BP: 131/70 138/64  Pulse: 97 95  Resp: 18 18  Temp: 98.6 F (37 C) 98.4 F (36.9 C)  SpO2: 99% 97%   Filed Weights   10/10/23 0700 10/11/23 0500 10/12/23 0706  Weight: 132 lb 4.4 oz (60 kg) 139 lb 8.8 oz (63.3 kg) 137 lb 2 oz (62.2 kg)    GENERAL: alert, no distress and comfortable +ill-appearing SKIN: +pale skin color, texture, turgor are  normal, no rashes or significant lesions EYES: normal, conjunctiva are pink and non-injected, sclera clear OROPHARYNX: no exudate, no erythema and lips, buccal mucosa, and tongue normal  NECK: supple, thyroid  normal size, non-tender, without nodularity LYMPH: no palpable lymphadenopathy in the cervical, axillary or inguinal LUNGS: clear to auscultation and percussion with normal breathing effort HEART: regular rate & rhythm and no murmurs and +bil 1+ lower extremity edema ABDOMEN: abdomen soft, non-tender and normal bowel sounds MUSCULOSKELETAL: +difficulty ambulating  PSYCH: alert & oriented x 3 with fluent speech NEURO: no focal motor/sensory deficits   All questions were answered. The patient knows to call the clinic with any problems, questions or concerns.   The total time spent in the appointment was 40 minutes encounter with patient including review of chart and various tests results, discussions about plan of care and coordination of care plan  Olam Tyler Brunner, NP 10/13/2023 12:22 PM    Labs Reviewed:  Lab Results  Component Value Date   WBC 6.7 10/13/2023   HGB 8.5 (L) 10/13/2023   HCT 27.6 (L) 10/13/2023   MCV 93.6 10/13/2023   PLT 21 (LL) 10/13/2023   Recent Labs    10/07/23 1539 10/07/23 2151 10/07/23 2341 10/08/23 0725 10/08/23 1349 10/10/23 0345 10/11/23 0330 10/12/23 0535 10/13/23 0554  NA SPECIMEN DILUTED   < > 132* 135   < > 137 138 141 141  K SPECIMEN DILUTED   < > 7.3* 4.8   < > 3.6 3.3* 3.9 3.7  CL SPECIMEN DILUTED   < > 99 98   < > 106 105 107 106  CO2 SPECIMEN DILUTED   < > 18* 21*   < > 21* 23 24 25   GLUCOSE SPECIMEN DILUTED   < > 51* 76   < > 117* 101* 102* 77  BUN SPECIMEN DILUTED   < > 44* 46*   < > 20 14 10 9   CREATININE SPECIMEN DILUTED   < > 2.36* 2.31*   < > 0.92 0.76 0.78 0.66  CALCIUM  SPECIMEN DILUTED   < > 5.9* 5.8*   < > 7.0* 7.2* 7.7* 7.6*  GFRNONAA SPECIMEN DILUTED   < > 29* 30*   < > >60 >60 >60 >60  GFRAA SPECIMEN DILUTED  --   --    --   --   --   --   --   --   PROT SPECIMEN DILUTED   < > 4.9* 4.7*  --  4.5*  --   --   --   ALBUMIN SPECIMEN DILUTED   < > 3.1* 2.9*  --  2.9*  --   --   --   AST SPECIMEN DILUTED   < > 130* 118*  --  21  --   --   --   ALT SPECIMEN DILUTED   < > 36 43  --  20  --   --   --   ALKPHOS SPECIMEN DILUTED   < > 148* 131*  --  109  --   --   --  BILITOT SPECIMEN DILUTED   < > 1.1 1.2  --  1.2  --   --   --    < > = values in this interval not displayed.    Studies Reviewed:  CT HEAD WO CONTRAST ( ) Result Date: 10/07/2023 CLINICAL DATA:  Recent fall with headaches, initial encounter EXAM: CT HEAD WITHOUT CONTRAST TECHNIQUE: Contiguous axial images were obtained from the base of the skull through the vertex without intravenous contrast. RADIATION DOSE REDUCTION: This exam was performed according to the departmental dose-optimization program which includes automated exposure control, adjustment of the mA and/or kV according to patient size and/or use of iterative reconstruction technique. COMPARISON:  None Available. FINDINGS: Brain: No evidence of acute infarction, hemorrhage, hydrocephalus, extra-axial collection or mass lesion/mass effect. Mild atrophic changes are noted. Vascular: No hyperdense vessel or unexpected calcification. Skull: Normal. Negative for fracture or focal lesion. Sinuses/Orbits: No acute finding. Other: None. IMPRESSION: No acute intracranial abnormality noted. Electronically Signed   By: Oneil Devonshire M.D.   On: 10/07/2023 21:12   CT ABDOMEN PELVIS W CONTRAST Result Date: 10/07/2023 CLINICAL DATA:  Acute nonlocalized abdominal pain. History of CLL. Hypotensive [facility] with tachycardia and decreased oxygenation. EXAM: CT ABDOMEN AND PELVIS WITH CONTRAST TECHNIQUE: Multidetector CT imaging of the abdomen and pelvis was performed using the standard protocol following bolus administration of intravenous contrast. RADIATION DOSE REDUCTION: This exam was performed according to  the departmental dose-optimization program which includes automated exposure control, adjustment of the mA and/or kV according to patient size and/or use of iterative reconstruction technique. CONTRAST:  OMNIPAQUE  IOHEXOL  300 MG/ML  SOLN COMPARISON:  08/06/2023 FINDINGS: Lower chest: Large left and small right pleural effusions. Basilar atelectasis or consolidation also greater on the left. Cardiac enlargement. Hepatobiliary: No focal liver abnormality is seen. No gallstones, gallbladder wall thickening, or biliary dilatation. Pancreas: Unremarkable. No pancreatic ductal dilatation or surrounding inflammatory changes. Spleen: Spleen is enlarged. Focal wedge-shaped defect in the posterior periphery likely representing splenic infarct. Adrenals/Urinary Tract: No adrenal gland nodules. Kidneys are symmetrical. No hydronephrosis or hydroureter. Bladder is normal. Stomach/Bowel: Stomach, small bowel, and colon are not abnormally distended. No wall thickening or inflammatory infiltration. Appendix is normal. Vascular/Lymphatic: Calcification of the aorta. No aneurysm. There is extensive bulky lymphadenopathy involving the retrocrural space, celiac axis, splenic hilar region, throughout the retroperitoneum, throughout the mesentery, bilateral anterior and posterior pelvic chains, in the sciatic region, and in the groin regions. Largest retroperitoneal lymph node mass measures about 8.9 x 8.5 cm on transverse dimension. Measured at a similar level, there is no significant change since the prior study. Reproductive: Prostate is unremarkable. Other: Small amount of free fluid along the pericolic gutters. No free air. Musculoskeletal: Degenerative changes in the spine. Lucent lesions demonstrated in the sacrum and pelvis, most prominent on the right. Focal lesions measure up to about 1.7 cm diameter. These lesions are progressive since prior study. IMPRESSION: 1. Extensive bulky lymphadenopathy throughout the lower  chest, abdomen, pelvis, and groin regions. Lymphadenopathy is similar to the previous study. 2. Splenic enlargement with peripheral splenic infarct. This is likely due to lymphomatous involvement. 3. Small lucent lesions in the pelvis may represent lymphomatous involvement. Bone lesions appear progressive since prior study. 4. Moderate left and small right pleural effusions with basilar atelectasis, progressing since prior study. Cardiac enlargement. Electronically Signed   By: Elsie Gravely M.D.   On: 10/07/2023 17:51   DG Chest Port 1 View Result Date: 10/07/2023 CLINICAL DATA:  Questionable sepsis.  EXAM: PORTABLE CHEST 1 VIEW COMPARISON:  Chest CT dated 08/07/2023. FINDINGS: Small left pleural effusion and left lung base atelectasis or infiltrate. No pneumothorax. The cardiac silhouette is within limits. Right paratracheal density consistent with adenopathy. No acute osseous pathology. IMPRESSION: 1. Small left pleural effusion and left lung base atelectasis or infiltrate. 2. Right paratracheal adenopathy. Electronically Signed   By: Vanetta Chou M.D.   On: 10/07/2023 15:59

## 2023-10-13 NOTE — Progress Notes (Signed)
 Palliative Medicine [facility]  [phone] [phone]   Name: Tyler Scott Date: 10/13/2023 MRN: [id number]  DOB: 1955/04/25  Patient Care Team: Clinic, [facility] as PCP - General Ethyl Lonni FORBES, MD (Inactive) Pickenpack-Cousar, Fannie SAILOR, NP as Nurse Practitioner [facility] and Palliative Medicine)    INTERVAL HISTORY: DAVONTAE PRUSINSKI is a 68 y.o. male with history of CLL (2012) and with recent dx of low grade lymphoma (07/2023). status post several previous therapies for CLL including Bendamustine  and rituximab  (July 2017 and received total of 2 cycles. Therapy discontinued in August 2017 based on patient's wishes after achieving partial response). He developed a relapse disease in April 2019. Ibrutinib  420 mg daily started in April 2019 and discontinued in September 2023 after poor adherence. Most recently on Calquence  since 2023.     Patient was supposed to be on Gazyva , venetoclax . He had many interrupted cycles of Gazyva  due to No shows.  He was last seen in outpatient oncology on 07/06/23 at which time he was scheduled for C4D1 Gazyva  however was HELD due to infection/acute left sided epidydimo-orchitis for which he was on antibiotics.  Palliative following for symptom management support.   Admitted 08/06/23-08/18/23 for low back pain. Had retroperitoneal LN biopsy on 08/09/23. Pathology showed low grade lymphoma.   SOCIAL HISTORY:     reports that he has never smoked. He has never used smokeless tobacco. He reports current alcohol  use of about 4.0 standard drinks of alcohol  per week. He reports that he does not use drugs.  ADVANCE DIRECTIVES:  None on file. Patient expressed interest in completing.   CODE STATUS: Full code  PAST MEDICAL HISTORY: Past Medical History:  Diagnosis Date   Allergy    Arthritis    CLL (chronic lymphocytic leukemia) (HCC)    stage III: asymptomatic; on observation   DIABETES MELLITUS, TYPE  I, UNCONTROLLED 12/30/2006   insulin    Diabetic retinopathy    ERECTILE DYSFUNCTION, ORGANIC 12/30/2006   Hx of adenomatous polyp of colon 05/24/2014   HYPERLIPIDEMIA 12/30/2006   HYPERTENSION 12/30/2006   LOW BACK PAIN, ACUTE 12/21/2006    ALLERGIES:  has no known allergies.  MEDICATIONS:  No current facility-administered medications for this visit.   Current Outpatient Medications  Medication Sig Dispense Refill   venetoclax  (VENCLEXTA ) 10 & 50 & 100 MG Starter Pack Take by mouth daily. Take 20 mg for 7 days, then 50 mg daily x 7d, then 100 mg daily x 7d, then 200 mg daily x 7d. Take with food & water. 42 tablet 0   Facility-Administered Medications Ordered in Other Visits  Medication Dose Route Frequency Provider Last Rate Last Admin   0.9 %  sodium chloride  infusion (Manually program via Guardrails IV Fluids)   Intravenous Once Doretha Folks, MD       acyclovir  (ZOVIRAX ) tablet 400 mg  400 mg Oral Daily [place], Eric J, DO   400 mg at 10/12/23 9061   allopurinol  (ZYLOPRIM ) tablet 150 mg  150 mg Oral Daily [place], Eric J, DO   150 mg at 10/12/23 9061   artificial tears ophthalmic solution 1 drop  1 drop Both Eyes PRN Olalere, Adewale A, MD   1 drop at 10/10/23 2212   Chlorhexidine  Gluconate Cloth 2 % PADS 6 each  6 each Topical Daily Ilah Corean HERO, PA-C   6 each at 10/12/23 1630   docusate sodium  (COLACE) capsule 100 mg  100 mg Oral BID PRN Ilah Corean HERO,  PA-C       doxepin  (SINEQUAN ) capsule 25 mg  25 mg Oral QHS [place], Eric J, DO   25 mg at 10/12/23 2146   feeding supplement (ENSURE PLUS HIGH PROTEIN) liquid 237 mL  237 mL Oral BID BM [place], Eric J, DO       gabapentin  (NEURONTIN ) capsule 100 mg  100 mg Oral TID [place], Eric J, DO   100 mg at 10/12/23 2147   guaiFENesin -dextromethorphan  (ROBITUSSIN DM) 100-10 MG/5ML syrup 5 mL  5 mL Oral Q4H PRN Ogan, Okoronkwo U, MD   5 mL at 10/09/23 9567   HYDROmorphone  (DILAUDID ) tablet 2 mg  2 mg Oral QHS [place], Eric J,  DO   2 mg at 10/12/23 2147   insulin  aspart (novoLOG ) injection 0-9 Units  0-9 Units Subcutaneous Q4H Ilah Corean HERO, PA-C   1 Units at 10/12/23 2119   lip balm (CARMEX) ointment   Topical PRN Kara Dorn NOVAK, MD   Given at 10/08/23 2237   methocarbamol  (ROBAXIN ) tablet 500 mg  500 mg Oral QHS [place], Eric J, DO   500 mg at 10/12/23 2146   morphine  (PF) 2 MG/ML injection 2 mg  2 mg Intravenous Q3H PRN [place], Eric J, DO   2 mg at 10/10/23 1319   ondansetron  (ZOFRAN ) injection 4 mg  4 mg Intravenous Q6H PRN Ilah Corean M, PA-C       pantoprazole  (PROTONIX ) injection 40 mg  40 mg Intravenous Q24H Kara Dorn NOVAK, MD   40 mg at 10/12/23 2146   polyethylene glycol (MIRALAX  / GLYCOLAX ) packet 17 g  17 g Oral Daily PRN Ilah Corean HERO, PA-C       simvastatin  (ZOCOR ) tablet 20 mg  20 mg Oral q1800 [place], Eric J, DO   20 mg at 10/12/23 1736   traMADol  (ULTRAM ) tablet 50 mg  50 mg Oral Q6H PRN [place], Eric J, DO   50 mg at 10/12/23 1736    VITAL SIGNS: BP 98/61 (BP Location: Right Arm, Patient Position: Sitting) Comment: 115/61  Pulse (!) 122   Temp 98.1 F (36.7 C) (Tympanic)   Resp 18   Ht 5' 8 (1.727 m)   Wt 123 lb 8 oz (56 kg)   SpO2 100%   BMI 18.78 kg/m  Filed Weights   10/06/23 1336  Weight: 123 lb 8 oz (56 kg)    Estimated body mass index is 18.78 kg/m as calculated from the following:   Height as of this encounter: 5' 8 (1.727 m).   Weight as of this encounter: 123 lb 8 oz (56 kg).   PERFORMANCE STATUS (ECOG) : 1 - Symptomatic but completely ambulatory   Physical Exam General: NAD Cardiovascular: regular rate and rhythm Pulmonary: normal breathing pattern Extremities: no edema, no joint deformities Skin: no rashes Neurological: AAO x3  IMPRESSION: Discussed the use of AI scribe software for clinical note transcription with the patient, who gave verbal consent to proceed.  History of Present Illness Tyler Scott is a 68 year old male who  presents to clinic for follow-up.  No acute distress noted.  Patient is accompanied by his wife.  Denies concerns for nausea, constipation, or diarrhea.  Persistent fatigue however he tries to remain as active as possible.  He is managing multiple medications related to his cancer treatment, including antiretrovirals prophylactically to prevent infections such as hepatitis and shingles. He is currently prescribed Vemlidy , an antiviral medication, and is awaiting further consultation regarding this medication.  He  takes methocarbamol  as needed for occasional muscle spasms. For pain management, he is on gabapentin  100 mg three times a day and morphine  ER 15 mg every twelve hours. He is nearly out of morphine  and requires a refill. Additionally, he uses hydromorphone  at night for breakthrough pain, although he does not have the bottle with him at the moment. Reports current regimen is controlling pain. No adjustments at this time.   All questions answered and support provided.  Assessment & Plan Cancer Pain  Cancer pain with a severity of 7/10 at times, managed with gabapentin , hydromorphone , and morphine . Gabapentin  is taken once daily instead of the prescribed three times daily. Hydromorphone  is used for breakthrough pain, primarily at night. Morphine  is taken every twelve hours. - Instruct to take gabapentin  100 mg three times daily as prescribed. - Continue hydromorphone  2 mg every six hours as needed for breakthrough pain. - Continue morphine  15mg  every twelve hours.  Constipation Experiencing constipation with bowel movements every three to four days. Currently taking a stool softener every other day. - Instruct to take stool softener daily to improve bowel movement frequency.  Poor appetite Reports poor appetite and occasional nausea. Weight remains at 124 lbs since July. - Scheduled to see a dietitian on October 06, 2023.  Malignant neoplasm under active treatment Undergoing treatment for  malignant neoplasm. On antiretrovirals prophylactically to prevent infections such as hepatitis and shingles. Awaiting pharmacist consultation regarding new antiviral medication. Oncologist consultation needed for amlodipine  due to potential impact on blood pressure during cancer treatment. - Consult oncologist regarding amlodipine  and its impact on blood pressure during cancer treatment - Ensure pharmacist contacts patient to discuss new antiviral medication before starting  Muscle spasms of back Experiences intermittent muscle spasms. Methocarbamol  prescribed as needed for relief. - Ensure methocarbamol  is available for as-needed use for muscle spasms  Hypertension Blood pressure low during visit. Currently on amlodipine , but use is reconsidered due to low blood pressure and ongoing cancer treatment. Oncologist consultation needed to determine necessity of continuing amlodipine . - Hold amlodipine  until consultation with oncologist regarding its necessity  I will plan to see patient back in 2-3 weeks.   Patient expressed understanding and was in agreement with this plan. He also understands that He can call the clinic at any time with any questions, concerns, or complaints.   Any controlled substances utilized were prescribed in the context of palliative care. PDMP has been reviewed.   Visit consisted of counseling and education dealing with the complex and emotionally intense issues of symptom management and palliative care in the setting of serious and potentially life-threatening illness.  Levon Borer, AGPCNP-BC  Palliative Medicine Team/[facility]

## 2023-10-13 NOTE — TOC Progression Note (Addendum)
 Transition of Care [facility]) - Progression Note    Patient Details  Name: Tyler Scott MRN: [id number] Date of Birth: 13-Oct-1955  Transition of Care [facility]) CM/SW Contact  Heather DELENA Saltness, LCSW Phone Number: 10/13/2023, 1:28 PM  Clinical Narrative:     ADDENDUM  4:17 PM - CSW received phone call from [facility] [phone] ext. [phone], at [facility]. CSW completed [facility] Screening Checklist and left it on pt's chart at RN station, awaiting MD signature. Once signed, fax, along with requested clinical information, to the [facility] team at: [phone]. TOC will fax documents tomorrow morning 9/4.  3:50 PM - CSW left voicemail for Reche [phone], ext. [phone], social worker, at [facility] [place] to inquire about insurance authorization for [facility]. TOC will await follow up.  CSW met with pt at bedside to discuss bed offers for SNF rehab. CSW advised pt of [facility] offering bed and that [facility] only contracts with certain facilities, including [facility]. CSW advised pt of [facility] location and Medicare Star-Rating. Pt chooses bed at [facility]. CSW notified Whitney at [facility]. CSW will begin process for insurance authorization though the [place].   Medicare Star-Rating  [facility] [address] [place], [place] [address] [phone] Overall rating ???? Much above average   CSW received list of [facility] SNF facilities. CSW sent updated notes to [facility] in [facility]. [facility] currently reviewing. Pt and wife made aware. TOC will continue to follow.   Expected Discharge Plan: Home w Home Health Services Barriers to Discharge: Continued Medical Work up   Expected Discharge Plan and Services In-house Referral: Clinical Social Work Discharge Planning Services: NA Post Acute Care Choice: Home Health Living arrangements for the past 2 months: Single Family Home                 DME Arranged: N/A DME Agency: NA        HH Arranged: NA HH Agency: Other - See comment Producer, television/film/video) Date HH Agency Contacted: 10/11/23 Time HH Agency Contacted: 1422 Representative spoke with at [facility] Agency: Jon   Social Drivers of Health (SDOH) Interventions SDOH Screenings   Food Insecurity: No Food Insecurity (10/08/2023)  Housing: Low Risk  (10/08/2023)  Transportation Needs: No Transportation Needs (10/08/2023)  Utilities: Not At Risk (10/08/2023)  Depression (PHQ2-9): Low Risk  (09/14/2023)  Social Connections: Unknown (10/08/2023)  Recent Concern: Social Connections - Moderately Isolated (10/08/2023)  Tobacco Use: Low Risk  (10/07/2023)    Readmission Risk Interventions    10/11/2023    2:19 PM 08/30/2023   11:40 AM 08/07/2023    8:49 AM  Readmission Risk Prevention Plan  Transportation Screening Complete Complete Complete  PCP or Specialist Appt within 5-7 Days   Complete  PCP or Specialist Appt within 3-5 Days  Complete   Home Care Screening   Complete  Medication Review (RN CM)   Complete  HRI or Home Care Consult  Complete   Social Work Consult for Recovery Care Planning/Counseling  Complete   Palliative Care Screening  Complete   Medication Review [employer]) Complete Complete   PCP or Specialist appointment within 3-5 days of discharge Complete    HRI or Home Care Consult Complete    SW Recovery Care/Counseling Consult Complete    Palliative Care Screening Not Applicable    Skilled Nursing Facility Not Applicable     Signed: Heather Saltness, MSW, LCSW Clinical Social Worker Inpatient Care Management 10/13/2023 1:32 PM

## 2023-10-13 NOTE — Hospital Course (Addendum)
 Tyler Scott is a 68 y.o. male with past medical history significant for CLL with recent initiation of chemotherapy, HTN, HLD, DM 1, chronic ITP (baseline Plt 40's) who presented to [facility] ED from the cancer center with complaints of abdominal pain, nausea, poor oral intake, generalized weakness with near syncope and shortness of breath.  Patient was due for next chemotherapy infusion today, 8/28 and when attempting to get to the car had an episode of near syncope with associated shortness of breath and generalized weakness.  EMS was activated in which she was noted to be tachycardic, hypotensive with mild AMS (improved shortly after arrival).  Patient's wife also noted hyperglycemia to the 300s the morning of 10/07/2023 in which she administered insulin .   On ED arrival, patient was afebrile with HR 116, BP 95/67, RR 25, SpO2 94%. Labs were notable for WBC 14.6 (down from 17.1 8/27 with ANC 1100), Hgb 1.7 (8.2), Plt 10 (47). INR > 10 (1 two months PTA). CMP/lipase pending. Trop 46. LA 2.5 > 5.4. VBG 7.21/pCO2 < 18/pO2 < 31/bicarb 5.6. Initial CBG 40. CXR demonstrated small L pleural effusion and L lung base atelectasis with R paratracheal adenoapthyp; CT A/P showed extensive bulky LAD throughout the lower chest, abdomen, pelvis and groin regions (similar from prior), splenic enlargement with peripheral splenic infarct, small lucent lesions in the pelvis c/f lymphomatous involvement, moderate L and small R pleural effusions with atelectasis. Broad-spectrum antibiotics were started, Cx pending. Levophed  started for hypotension. Oncology consulted. PCCM consulted for ICU admission.   Significant hospital events: 8/28: Admit ICU, started on vasopressors, oncology consulted; transfused 2 unit PRBC 8/29: Levophed  titrated off; received rasburicase , 1 unit platelets, 1 unit PRBC transfusion 8/30: 1 unit platelet transfusion 8/31: Transferred to [facility]; 2 unit platelet transfusion 9/1: 1u platelet transfusion,  transfer to 6 E. 9/2: transfuse 1u Plt, to receive NPLATE  per oncology  9/3: SNF Selected and awaiting for [facility] to make decision on acceptance for SNF 9/5: Medically stable to D/C to SNF but awaiting Insurance Auth and [place] to make a decision and it will be done hopefully 10/18/23  Assessment and Plan:  Shock, undifferentiated Concern for sepsis, ruled out Patient presenting with weakness and noted to be hypotensive, initially admitted to the intensive care unit on vasopressors.  Suspect etiology likely secondary to severe dehydration, poor oral intake with recent chemotherapy infusion as well as tumor lysis syndrome as above.  Initially was started on empiric antibiotics with vancomycin , cefepime  and doxycycline  which were stopped. WBC stable at 5.7   Acute hypoxemic respiratory failure: Resolved; Supplemental oxygen weaned off.   CLL/SLL/low-grade lymphoma Tumor lysis syndrome Patient initially diagnosed with CLL 2012. Most recently patient received Cycle 1 Bendamustine  (without Rituxan ) on 09/08/23.  Now he is status post cycle 2 which was started on 10/06/2023.  Imaging 8/29 with extensive bulky lymphadenopathy throughout the lower chest, abdomen, pelvis and groin similar to previous study also with splenic enlargement, noted bone lesions progressive since prior study.  Uric acid 12.3, received rasburicase  on 8/28. -Uric acid 12.3>5.1..<1.5 on last check -Further per oncology and the plan is for Venetoclax  + Rituxan  the patient's platelet counts are better.  Will continue physical therapy efforts and dietary efforts while he is hospitalized and PT/OT recommending SNF.   Acute renal failure: Resolved; Suspect etiology multifactorial with ATN from hypotension/shock as well as prerenal azotemia from severe dehydration.  No hydronephrosis noted on CT abdomen/pelvis at time of admission.  IV fluids now discontinued and he was given  IV Lasix  20 mg x1 due to pedal edema and he is +4.8785 Liters since admit.  BUN/Cr Trend stable at 11/0.80 on last check. Avoid Nephrotoxic Medications, Contrast Dyes, Hypotension and Dehydration to Ensure Adequate Renal Perfusion and will need to Renally Adjust Meds. CTM and Trend Renal Function carefully and repeat CMP in the AM    Normocytic Anemia, severe: Hemoglobin on arrival 1.7; transfused 3 unit PRBCs. Hgb/Hct Trend: Recent Labs  Lab 10/10/23 1447 10/11/23 0330 10/12/23 0535 10/13/23 0554 10/14/23 0532 10/15/23 0526 10/16/23 0744  HGB 10.4* 8.7* 9.8* 8.5* 8.7* 8.3* 8.4*  HCT 33.4* 26.7* 30.6* 27.6* 27.6* 26.6* 26.9*  MCV 93.3 93.0 91.3 93.6 93.6 92.4 93.1  -Transfuse for hemoglobin less than 7.0. CTM for S/Sx of Bleeding; No overt bleeding noted. Repeat CBC in the AM   Thrombocytopenia, severe Platelet count 10K arrival.  Received 1 unit platelet on 8/29, 8/30, 2 u on 8/31, 1u 9/1 and 1u on 9/2 -Plt 10>15>18>>14>18>15>27>18 -> 21 x4 -NPLATE  ordered by oncology 10/12/23 and recommending 4 mcg/kg weekly -Transfuse for counts <20K or <50K with bleeding  -CBC with diff daily; for further discussion with oncology they feel that he is okay from their point of view to discharge the patient given that his platelet count has been above 20,000 x 3 and he will be set up with weekly Nplate  and PRBC transfusion support with labs weekly starting on 10/19/2023.  He is supposed to start Venetoclax  in 2 weeks -CTM closely for bleeding   Hypokalemia: K+ is now 4.1. CTM and Replete as Necessary. Repeat CMP in the AM  Hypophosphatemia: Phos Level is now 3.2. CTM and Replete as Necessary. Repeat Phos Level in the AM  Hypomagnesemia: Mag Level is now 1.8. Replete w/ IV Mag Sulfate 2 grams x1. CTM and Replete as Necessary. Repeat Mag Level in the AM  Constipation -> Diarrhea: Constipation Improved s/p Bisacodyl  Suppository 10 mg x1 and now having diarrhea and abdomnal discomfort; Had Changed Docusate 100 mg po BID PRN mild Constipation and Miralax  17 gras po Dailyprn to  Scheduled Senna-Docusate 1 tab po BID and Miralax  17 BID but will stop all Laxatives now   Essential HTN: Patient was notably hypotensive/shock as above at time of arrival but this is improved. CTM BP per protocol. Last BP reading was 148/77  HLD:  C/w Simvastatin  20 mg p.o. daily   Type 1 diabetes mellitus: On Jardiance  12.5 mg p.o. daily, Lantus  10-30 units [place] Prn at bedtime at baseline. C/w Sensitive SSI for coverage. CTM CBGs per Protocol. CBG trending from 91-140 the last 7 checks.   Chronic pain: Patient reportedly on MS Contin  15 mg p.o. nightly, gabapentin  100 mg p.o. 3 times daily Dilaudid  2 mg p.o. nightly Robaxin  500 mg p.o. nightly -Held home MS Contin  but continuing Continue Gabapentin  100 mg PO TID, Hydromorphone  2 mg PO at bedtime, and Robaxin  500 mg PO at bedtime; Also has IV Morphine  2 mg q3h prn Severe Pain  Depression and Anxiety: Psychiatry consulted for further evaluation they are recommending sertraline  25 mg p.o. daily and then titrating to 50 mg a day eventually.  Patient remains on Doxepin  25 mg nightly   Weakness/Debility/Deconditioning/Gait Disturbance: -PT currently recommending SNF now to address mobility and functional Decline  -Spouse reports unable to care for patient currently at home; [facility] consulted and SNF being pursued.    Hypoalbuminemia: Patient's Albumin Lvl has gone from 3.4 -> 3.1 -> 2.9 x2 -> 3.0 -> 3.1 -> 3.2. CTM and  Trend and repeat CMP in the AM

## 2023-10-14 ENCOUNTER — Ambulatory Visit

## 2023-10-14 DIAGNOSIS — C9110 Chronic lymphocytic leukemia of B-cell type not having achieved remission: Secondary | ICD-10-CM | POA: Diagnosis not present

## 2023-10-14 DIAGNOSIS — F3289 Other specified depressive episodes: Secondary | ICD-10-CM

## 2023-10-14 DIAGNOSIS — R579 Shock, unspecified: Secondary | ICD-10-CM | POA: Diagnosis not present

## 2023-10-14 DIAGNOSIS — F419 Anxiety disorder, unspecified: Secondary | ICD-10-CM

## 2023-10-14 DIAGNOSIS — D649 Anemia, unspecified: Secondary | ICD-10-CM | POA: Diagnosis not present

## 2023-10-14 DIAGNOSIS — D693 Immune thrombocytopenic purpura: Secondary | ICD-10-CM

## 2023-10-14 DIAGNOSIS — G8929 Other chronic pain: Secondary | ICD-10-CM | POA: Diagnosis not present

## 2023-10-14 LAB — COMPREHENSIVE METABOLIC PANEL WITH GFR
ALT: 9 U/L (ref 0–44)
AST: 20 U/L (ref 15–41)
Albumin: 3 g/dL — ABNORMAL LOW (ref 3.5–5.0)
Alkaline Phosphatase: 88 U/L (ref 38–126)
Anion gap: 10 (ref 5–15)
BUN: 8 mg/dL (ref 8–23)
CO2: 24 mmol/L (ref 22–32)
Calcium: 7.8 mg/dL — ABNORMAL LOW (ref 8.9–10.3)
Chloride: 104 mmol/L (ref 98–111)
Creatinine, Ser: 0.68 mg/dL (ref 0.61–1.24)
GFR, Estimated: >60 mL/min (ref >60)
Glucose, Bld: 81 mg/dL (ref 70–99)
Potassium: 3.8 mmol/L (ref 3.5–5.1)
Sodium: 138 mmol/L (ref 135–145)
Total Bilirubin: 1.1 mg/dL (ref 0.0–1.2)
Total Protein: 4.6 g/dL — ABNORMAL LOW (ref 6.5–8.1)

## 2023-10-14 LAB — GLUCOSE, CAPILLARY
Glucose-Capillary: 80 mg/dL (ref 70–99)
Glucose-Capillary: 138 mg/dL — ABNORMAL HIGH (ref 70–99)
Glucose-Capillary: 93 mg/dL (ref 70–99)
Glucose-Capillary: 137 mg/dL — ABNORMAL HIGH (ref 70–99)
Glucose-Capillary: 77 mg/dL (ref 70–99)

## 2023-10-14 LAB — CBC WITH DIFFERENTIAL/PLATELET
Abs Immature Granulocytes: 0.05 K/uL (ref 0.00–0.07)
Basophils Absolute: 0.1 K/uL (ref 0.0–0.1)
Basophils Relative: 1 %
Eosinophils Absolute: 0.1 K/uL (ref 0.0–0.5)
Eosinophils Relative: 1 %
HCT: 27.6 % — ABNORMAL LOW (ref 39.0–52.0)
Hemoglobin: 8.7 g/dL — ABNORMAL LOW (ref 13.0–17.0)
Immature Granulocytes: 1 %
Lymphocytes Relative: 72 %
Lymphs Abs: 5 K/uL — ABNORMAL HIGH (ref 0.7–4.0)
MCH: 29.5 pg (ref 26.0–34.0)
MCHC: 31.5 g/dL (ref 30.0–36.0)
MCV: 93.6 fL (ref 80.0–100.0)
Monocytes Absolute: 0.6 K/uL (ref 0.1–1.0)
Monocytes Relative: 8 %
Neutro Abs: 1.2 K/uL — ABNORMAL LOW (ref 1.7–7.7)
Neutrophils Relative %: 17 %
Platelets: 21 K/uL — CL (ref 150–400)
RBC: 2.95 MIL/uL — ABNORMAL LOW (ref 4.22–5.81)
RDW: 17.4 % — ABNORMAL HIGH (ref 11.5–15.5)
Smear Review: NORMAL
WBC: 7 K/uL (ref 4.0–10.5)
nRBC: 0.6 % — ABNORMAL HIGH (ref 0.0–0.2)

## 2023-10-14 LAB — PHOSPHORUS: Phosphorus: 2.2 mg/dL — ABNORMAL LOW (ref 2.5–4.6)

## 2023-10-14 LAB — SURGICAL PATHOLOGY

## 2023-10-14 LAB — FLOW CYTOMETRY

## 2023-10-14 LAB — MAGNESIUM: Magnesium: 2.1 mg/dL (ref 1.7–2.4)

## 2023-10-14 MED ORDER — POLYETHYLENE GLYCOL 3350 17 G PO PACK
17.0000 g | PACK | Freq: Two times a day (BID) | ORAL | Status: DC
  Administered 2023-10-14 – 2023-10-16 (×3): 17 g via ORAL
  Filled 2023-10-14 (×4): qty 1

## 2023-10-14 MED ORDER — FUROSEMIDE 10 MG/ML IJ SOLN
20.0000 mg | Freq: Once | INTRAMUSCULAR | Status: AC
  Administered 2023-10-14: 20 mg via INTRAVENOUS
  Filled 2023-10-14: qty 2

## 2023-10-14 MED ORDER — SENNOSIDES-DOCUSATE SODIUM 8.6-50 MG PO TABS
1.0000 | ORAL_TABLET | Freq: Two times a day (BID) | ORAL | Status: DC
  Administered 2023-10-15 – 2023-10-16 (×3): 1 via ORAL
  Filled 2023-10-14 (×4): qty 1

## 2023-10-14 MED ORDER — K PHOS MONO-SOD PHOS DI & MONO 155-852-130 MG PO TABS
500.0000 mg | ORAL_TABLET | Freq: Two times a day (BID) | ORAL | Status: AC
  Administered 2023-10-14 – 2023-10-15 (×2): 500 mg via ORAL
  Filled 2023-10-14 (×2): qty 2

## 2023-10-14 MED ORDER — SERTRALINE HCL 25 MG PO TABS
25.0000 mg | ORAL_TABLET | Freq: Every day | ORAL | Status: DC
  Administered 2023-10-14 – 2023-10-23 (×10): 25 mg via ORAL
  Filled 2023-10-14 (×10): qty 1

## 2023-10-14 MED ORDER — DULOXETINE HCL 20 MG PO CPEP: 20.0000 mg | ORAL_CAPSULE | Freq: Every day | ORAL | Status: DC

## 2023-10-14 MED ORDER — BISACODYL 10 MG RE SUPP
10.0000 mg | Freq: Once | RECTAL | Status: AC
  Administered 2023-10-14: 10 mg via RECTAL
  Filled 2023-10-14: qty 1

## 2023-10-14 NOTE — Progress Notes (Signed)
 Tyler Scott   DOB:04-Sep-1955   FM#:[id number]      ASSESSMENT & PLAN:  Tyler Scott is a 68 year old male patient with oncologic history significant for CLL. Patient admitted overnight 8/28 with complaints of generalized weakness, SOB, nausea, and near-syncope. Status post fall at home. Oncology following closely.   CLL/SLL/Low grade Lymphoma -- Diagnosed with CLL in 2012 -- Patient is s/p multiple lines of therapy including bendamustine , rituximab , ibrutinib , and calquence . -- Previously many interrupted cycles of therapy due to no shows for appointments. -- Received Cycle 1 Bendamustine  (without Rituxan ) on 09/08/23. Patient was seen for follow up and toxicity check in outpatient oncology on 09/22/23. C2 started on 8/27.  -- Imaging done 8/28 shows extensive bulky lymphadenopathy throughout lower chest, abdomen, pelvis, and groin; similar to previous study. Also shows splenic enlargement. Bone lesions progressive since prior study.  -- Plan is for Venetoclax  + Rituxan  once PLt counts better. Venetoclax  -- prescription sent -- plan to start in 2-3 weeks as outpatient.  Venetoclax  starter pack delivered to patient's home in April and was not started, patient reports he has no recollection of this medication.  Called wife who was not at home.  She will check at home and let us  know. -- Patient's wife requesting discharge to SNF as she is unable to care for him in his current weakened state. -- Medical Oncology/Dr. Onesimo following closely.   Anemia, severe --Hemoglobin on arrival documented 1.7, however may have been inaccurate.  Patient received 3 units PRBC.   -- Hemoglobin noted with decrease although may be related to increasing amounts of IV fluids patient has been receiving.  8.7 today.  No transfusion required at this time. -- Recommend PRBC transfusion for HGB<7.0 -- continue to monitor CBC with differential   Thrombocytopenia, severe -- Platelets 10K on arrival --  Platelets remains at 21K today.  Status post one unit platelets given yesterday.   -- Recommend transfusion for counts <20K or <50K with bleeding -- Platelets typically nadir at day 12-14 post Benda received 8/27... although he received <50% of dose.    -- On weekly Nplate  4 mcg/kg, continue weekly to maintain PLT count of >50k  -- Monitor closely for bleeding   Electrolyte Imbalance -- Potassium noted <2.0 on arrival and increased to 7.3 post repletion. -- K+ currently stable 3.8 -- Monitor closely and replete electrolytes   Generalized weakness Shortness of breath with near syncope -- Encourage patient to eat to optimize his nutrition -- No shortness of breath noted -- Continue good hydration -- Continue supportive care  Depression -- Psych eval requested and pending.  Patient will need to show up for oncologic therapy upon discharge.  Has shown some non-compliance although verbalizes that he wants to continue with full scope of care.   -- Continue supportive care    Code Status Full   Subjective:  Patient awake and alert sitting in chair at bedside.  Complains of constipation, has not had bowel movement in a couple days.  Otherwise feels okay.  No acute complaints offered.    Objective:   Intake/Output Summary (Last 24 hours) at 10/14/2023 1136 Last data filed at 10/14/2023 0500 Gross per 24 hour  Intake 340 ml  Output 400 ml  Net -60 ml     PHYSICAL EXAMINATION: ECOG PERFORMANCE STATUS: 3 - Symptomatic, >50% confined to bed  Vitals:   10/14/23 0409 10/14/23 0428  BP: (!) 142/63 111/75  Pulse: 91 (!) 105  Resp: 18 16  Temp: 97.7 F (36.5 C) 97.8 F (36.6 C)  SpO2: 96% 96%   Filed Weights   10/10/23 0700 10/11/23 0500 10/12/23 0706  Weight: 132 lb 4.4 oz (60 kg) 139 lb 8.8 oz (63.3 kg) 137 lb 2 oz (62.2 kg)    GENERAL: alert, no distress and comfortable +ill-appearing SKIN: +pale skin color, texture, turgor are normal, no rashes or significant  lesions EYES: normal, conjunctiva are pink and non-injected, sclera clear OROPHARYNX: no exudate, no erythema and lips, buccal mucosa, and tongue normal  NECK: supple, thyroid  normal size, non-tender, without nodularity LYMPH: no palpable lymphadenopathy in the cervical, axillary or inguinal LUNGS: clear to auscultation and percussion with normal breathing effort HEART: regular rate & rhythm and no murmurs and +bil 1+ lower extremity edema ABDOMEN: abdomen soft, non-tender and normal bowel sounds MUSCULOSKELETAL: +difficulty ambulating  PSYCH: alert & oriented x 3 with fluent speech NEURO: no focal motor/sensory deficits   All questions were answered. The patient knows to call the clinic with any problems, questions or concerns.   The total time spent in the appointment was 40 minutes encounter with patient including review of chart and various tests results, discussions about plan of care and coordination of care plan  [name], NP 10/14/2023 11:36 AM    Labs Reviewed:  Lab Results  Component Value Date   WBC 7.0 10/14/2023   HGB 8.7 (L) 10/14/2023   HCT 27.6 (L) 10/14/2023   MCV 93.6 10/14/2023   PLT 21 (LL) 10/14/2023   Recent Labs    10/07/23 1539 10/07/23 2151 10/08/23 0725 10/08/23 1349 10/10/23 0345 10/11/23 0330 10/12/23 0535 10/13/23 0554 10/14/23 0532  NA SPECIMEN DILUTED   < > 135   < > 137   < > 141 141 138  K SPECIMEN DILUTED   < > 4.8   < > 3.6   < > 3.9 3.7 3.8  CL SPECIMEN DILUTED   < > 98   < > 106   < > 107 106 104  CO2 SPECIMEN DILUTED   < > 21*   < > 21*   < > 24 25 24   GLUCOSE SPECIMEN DILUTED   < > 76   < > 117*   < > 102* 77 81  BUN SPECIMEN DILUTED   < > 46*   < > 20   < > 10 9 8   CREATININE SPECIMEN DILUTED   < > 2.31*   < > 0.92   < > 0.78 0.66 0.68  CALCIUM  SPECIMEN DILUTED   < > 5.8*   < > 7.0*   < > 7.7* 7.6* 7.8*  GFRNONAA SPECIMEN DILUTED   < > 30*   < > >60   < > >60 >60 >60  GFRAA SPECIMEN DILUTED  --   --   --   --   --   --   --    --   PROT SPECIMEN DILUTED   < > 4.7*  --  4.5*  --   --   --  4.6*  ALBUMIN SPECIMEN DILUTED   < > 2.9*  --  2.9*  --   --   --  3.0*  AST SPECIMEN DILUTED   < > 118*  --  21  --   --   --  20  ALT SPECIMEN DILUTED   < > 43  --  20  --   --   --  9  ALKPHOS SPECIMEN DILUTED   < >  131*  --  109  --   --   --  88  BILITOT SPECIMEN DILUTED   < > 1.2  --  1.2  --   --   --  1.1   < > = values in this interval not displayed.    Studies Reviewed:  CT HEAD WO CONTRAST ( ) Result Date: 10/07/2023 CLINICAL DATA:  Recent fall with headaches, initial encounter EXAM: CT HEAD WITHOUT CONTRAST TECHNIQUE: Contiguous axial images were obtained from the base of the skull through the vertex without intravenous contrast. RADIATION DOSE REDUCTION: This exam was performed according to the departmental dose-optimization program which includes automated exposure control, adjustment of the mA and/or kV according to patient size and/or use of iterative reconstruction technique. COMPARISON:  None Available. FINDINGS: Brain: No evidence of acute infarction, hemorrhage, hydrocephalus, extra-axial collection or mass lesion/mass effect. Mild atrophic changes are noted. Vascular: No hyperdense vessel or unexpected calcification. Skull: Normal. Negative for fracture or focal lesion. Sinuses/Orbits: No acute finding. Other: None. IMPRESSION: No acute intracranial abnormality noted. Electronically Signed   By: Oneil Devonshire M.D.   On: 10/07/2023 21:12   CT ABDOMEN PELVIS W CONTRAST Result Date: 10/07/2023 CLINICAL DATA:  Acute nonlocalized abdominal pain. History of CLL. Hypotensive [facility] with tachycardia and decreased oxygenation. EXAM: CT ABDOMEN AND PELVIS WITH CONTRAST TECHNIQUE: Multidetector CT imaging of the abdomen and pelvis was performed using the standard protocol following bolus administration of intravenous contrast. RADIATION DOSE REDUCTION: This exam was performed according to the departmental  dose-optimization program which includes automated exposure control, adjustment of the mA and/or kV according to patient size and/or use of iterative reconstruction technique. CONTRAST:  OMNIPAQUE  IOHEXOL  300 MG/ML  SOLN COMPARISON:  08/06/2023 FINDINGS: Lower chest: Large left and small right pleural effusions. Basilar atelectasis or consolidation also greater on the left. Cardiac enlargement. Hepatobiliary: No focal liver abnormality is seen. No gallstones, gallbladder wall thickening, or biliary dilatation. Pancreas: Unremarkable. No pancreatic ductal dilatation or surrounding inflammatory changes. Spleen: Spleen is enlarged. Focal wedge-shaped defect in the posterior periphery likely representing splenic infarct. Adrenals/Urinary Tract: No adrenal gland nodules. Kidneys are symmetrical. No hydronephrosis or hydroureter. Bladder is normal. Stomach/Bowel: Stomach, small bowel, and colon are not abnormally distended. No wall thickening or inflammatory infiltration. Appendix is normal. Vascular/Lymphatic: Calcification of the aorta. No aneurysm. There is extensive bulky lymphadenopathy involving the retrocrural space, celiac axis, splenic hilar region, throughout the retroperitoneum, throughout the mesentery, bilateral anterior and posterior pelvic chains, in the sciatic region, and in the groin regions. Largest retroperitoneal lymph node mass measures about 8.9 x 8.5 cm on transverse dimension. Measured at a similar level, there is no significant change since the prior study. Reproductive: Prostate is unremarkable. Other: Small amount of free fluid along the pericolic gutters. No free air. Musculoskeletal: Degenerative changes in the spine. Lucent lesions demonstrated in the sacrum and pelvis, most prominent on the right. Focal lesions measure up to about 1.7 cm diameter. These lesions are progressive since prior study. IMPRESSION: 1. Extensive bulky lymphadenopathy throughout the lower chest, abdomen,  pelvis, and groin regions. Lymphadenopathy is similar to the previous study. 2. Splenic enlargement with peripheral splenic infarct. This is likely due to lymphomatous involvement. 3. Small lucent lesions in the pelvis may represent lymphomatous involvement. Bone lesions appear progressive since prior study. 4. Moderate left and small right pleural effusions with basilar atelectasis, progressing since prior study. Cardiac enlargement. Electronically Signed   By: Elsie Gravely M.D.   On: 10/07/2023  17:51   DG Chest Port 1 View Result Date: 10/07/2023 CLINICAL DATA:  Questionable sepsis. EXAM: PORTABLE CHEST 1 VIEW COMPARISON:  Chest CT dated 08/07/2023. FINDINGS: Small left pleural effusion and left lung base atelectasis or infiltrate. No pneumothorax. The cardiac silhouette is within limits. Right paratracheal density consistent with adenopathy. No acute osseous pathology. IMPRESSION: 1. Small left pleural effusion and left lung base atelectasis or infiltrate. 2. Right paratracheal adenopathy. Electronically Signed   By: Vanetta Chou M.D.   On: 10/07/2023 15:59

## 2023-10-14 NOTE — Progress Notes (Signed)
 Occupational Therapy Treatment Patient Details Name: Tyler Scott MRN: [id number] DOB: 07/16/1955 Today's Date: 10/14/2023   History of present illness Pt sent from Cancer center to ED 2* SOB, weakness and syncope with BP of 79/42 with fall off garage steps into car with spouse.  Pt admitted with severe anemia, thrombocytopenia and hypotension.  Pt is currently undergoing chemo for CLL and has his of DM with diabetic retinopathy.   OT comments  Patient seen for skilled OT session this afternoon. Patient was attempting use of BSC without assist. OT reifnroced need for call button use and assist for transfers at all times. Patient continues to progress with functional transfers, ADL and mobility with RW and assist and remained OOB in recliner post session positioned with added lumbar support for pain management.  Patient requires continued Acute care hospital level OT services to progress safety and functional performance and allow for discharge. Patient will benefit from continued inpatient follow up therapy, <3 hours/day.         If plan is discharge home, recommend the following:  Two people to help with walking and/or transfers;A lot of help with bathing/dressing/bathroom;Assistance with cooking/housework;Direct supervision/assist for medications management;Direct supervision/assist for financial management;Assist for transportation;Help with stairs or ramp for entrance;Supervision due to cognitive status   Equipment Recommendations  None recommended by OT (TBD post rehab)       Precautions / Restrictions Precautions Precautions: Fall Restrictions Weight Bearing Restrictions Per Provider Order: No       Mobility Bed Mobility Overal bed mobility: Needs Assistance Bed Mobility: Supine to Sit     Supine to sit: Contact guard Sit to supine: Used rails   General bed mobility comments: was at bottom of bed sideways upon OT arrival    Transfers Overall transfer level: Needs  assistance Equipment used: Rolling walker (2 wheels) Transfers: Sit to/from Stand, Bed to chair/wheelchair/BSC Sit to Stand: Min assist, Mod assist     Step pivot transfers: Min assist, Mod assist, From elevated surface     General transfer comment: cues for hand placement and safety, short amb from [facility] by wall to recliner with RW ~ 8 ft     Balance Overall balance assessment: Needs assistance Sitting-balance support: No upper extremity supported, Feet supported Sitting balance-Leahy Scale: Fair     Standing balance support: Bilateral upper extremity supported Standing balance-Leahy Scale: Poor Standing balance comment: poor standing tolerance and heavy reliance on RW                           ADL either performed or assessed with clinical judgement   ADL Overall ADL's : Needs assistance/impaired Eating/Feeding: Set up   Grooming: Wash/dry hands;Wash/dry face;Oral care;Set up;Sitting   Upper Body Bathing: Contact guard assist;Sitting   Lower Body Bathing: Moderate assistance;Sit to/from stand   Upper Body Dressing : Contact guard assist;Sitting   Lower Body Dressing: Moderate assistance;Sit to/from stand;Cueing for sequencing;Cueing for safety   Toilet Transfer: Contact guard assist;Minimal assistance;Rolling walker (2 wheels);BSC/3in1 Toilet Transfer Details (indicate cue type and reason): cues for assist, hand placement and to recal condom catheter Toileting- Clothing Manipulation and Hygiene: Minimal assistance;Moderate assistance;Sitting/lateral lean Toileting - Clothing Manipulation Details (indicate cue type and reason): post BM     Functional mobility during ADLs: Minimal assistance;Moderate assistance;Rolling walker (2 wheels);Cueing for sequencing;Cueing for safety General ADL Comments: improved mobility for commode use and recliner access but needs cues and reminders to call for assistance, fatige and weakness  persists    Extremity/Trunk Assessment  Upper Extremity Assessment Upper Extremity Assessment: Generalized weakness;Right hand dominant   Lower Extremity Assessment Lower Extremity Assessment: Generalized weakness                 Communication Communication Communication: No apparent difficulties   Cognition Arousal: Alert Behavior During Therapy: WFL for tasks assessed/performed, Flat affect Cognition: Cognition impaired   Orientation impairments: Situation Awareness: Intellectual awareness impaired, Online awareness impaired Memory impairment (select all impairments): Short-term memory Attention impairment (select first level of impairment): Selective attention Executive functioning impairment (select all impairments): Reasoning, Problem solving OT - Cognition Comments: intermittent delayed processing; poor insight as patient attempted commode alone and was found by OT laying sideways in bed at base not calling for assist                 Following commands: Intact        Cueing   Cueing Techniques: Verbal cues        General Comments no skin issues present, no SOB 96% SpO2 on RA    Pertinent Vitals/ Pain       Pain Assessment Pain Assessment: Faces Faces Pain Scale: Hurts little more Pain Location: back Pain Descriptors / Indicators: Aching Pain Intervention(s): Repositioned, Monitored during session, Heat applied   Frequency  Min 2X/week        Progress Toward Goals  OT Goals(current goals can now be found in the care plan section)  Progress towards OT goals: Progressing toward goals  Acute Rehab OT Goals Patient Stated Goal: to feel stronger OT Goal Formulation: With patient Time For Goal Achievement: 10/25/23 Potential to Achieve Goals: Fair ADL Goals Pt Will Perform Lower Body Bathing: with contact guard assist;sit to/from stand Pt Will Perform Lower Body Dressing: with min assist;sit to/from stand Pt Will Transfer to Toilet: with contact guard assist;regular height toilet;grab  bars;ambulating Pt Will Perform Toileting - Clothing Manipulation and hygiene: with contact guard assist;sit to/from stand Pt Will Perform Tub/Shower Transfer: Shower transfer;rolling walker;grab bars;shower seat Pt/caregiver will Perform Home Exercise Program: Increased strength;With written HEP provided;Both right and left upper extremity;Independently Additional ADL Goal #1: Patient will teach back 3/5 ECT's with BADL's and mobility with min cues  Plan         AM-PAC OT 6 Clicks Daily Activity     Outcome Measure   Help from another person eating meals?: A Little Help from another person taking care of personal grooming?: A Little Help from another person toileting, which includes using toliet, bedpan, or urinal?: A Little Help from another person bathing (including washing, rinsing, drying)?: A Lot Help from another person to put on and taking off regular upper body clothing?: A Little Help from another person to put on and taking off regular lower body clothing?: A Lot 6 Click Score: 16    End of Session Equipment Utilized During Treatment: Gait belt;Rolling walker (2 wheels)  OT Visit Diagnosis: Unsteadiness on feet (R26.81);Muscle weakness (generalized) (M62.81);History of falling (Z91.81);Cognitive communication deficit (R41.841);Pain Pain - part of body:  (back pain)   Activity Tolerance Patient tolerated treatment well   Patient Left in chair;with call bell/phone within reach;with chair alarm set   Nurse Communication Mobility status;Other (comment) (patient found at base of bed using commode alone, OT doc on Flowsheet BM)        Time: 1530-1600 OT Time Calculation (min): 30 min  Charges: OT General Charges $OT Visit: 1 Visit OT Treatments $Self Care/Home Management : 23-37 mins  Shyanne Mcclary OT/L Acute Rehabilitation Department  [phone]  10/14/2023, 4:38 PM

## 2023-10-14 NOTE — Plan of Care (Signed)

## 2023-10-14 NOTE — TOC Progression Note (Addendum)
 Transition of Care [facility]) - Progression Note    Patient Details  Name: Tyler Scott MRN: [id number] Date of Birth: 02-15-1955  Transition of Care [facility]) CM/SW Contact  Sonda Manuella Quill, RN Phone Number: 10/14/2023, 4:54 PM  Clinical Narrative:    Attempted to fax [facility] Screening Checklist x 2 at 1403 and 1416 without success [phone]); documents sent: face sheet, H&P, MD and consult progress notes, PT/OT evals/notes, MAR. Labs, vitals, radiology reports, and FL2; spoke w/ Powell at [facility] [phone], ext [phone]); she gave alternate fax [phone]); documents faxed per request; awaiting electronic confirmation; LVM for Heather for notification documents have been sent; awaiting return call to confirm receipt of documents.  -1725- electronic confirmation received  Expected Discharge Plan: Home w Home Health Services Barriers to Discharge: Continued Medical Work up               Expected Discharge Plan and Services In-house Referral: Clinical Social Work Discharge Planning Services: NA Post Acute Care Choice: Home Health Living arrangements for the past 2 months: Single Family Home                 DME Arranged: N/A DME Agency: NA       HH Arranged: NA HH Agency: Other - See comment Producer, television/film/video) Date HH Agency Contacted: 10/11/23 Time HH Agency Contacted: 1422 Representative spoke with at [facility] Agency: Jon   Social Drivers of Health (SDOH) Interventions SDOH Screenings   Food Insecurity: No Food Insecurity (10/08/2023)  Housing: Low Risk  (10/08/2023)  Transportation Needs: No Transportation Needs (10/08/2023)  Utilities: Not At Risk (10/08/2023)  Depression (PHQ2-9): Low Risk  (09/14/2023)  Social Connections: Unknown (10/08/2023)  Recent Concern: Social Connections - Moderately Isolated (10/08/2023)  Tobacco Use: Low Risk  (10/07/2023)    Readmission Risk Interventions    10/11/2023    2:19 PM 08/30/2023   11:40 AM  08/07/2023    8:49 AM  Readmission Risk Prevention Plan  Transportation Screening Complete Complete Complete  PCP or Specialist Appt within 5-7 Days   Complete  PCP or Specialist Appt within 3-5 Days  Complete   Home Care Screening   Complete  Medication Review (RN CM)   Complete  HRI or Home Care Consult  Complete   Social Work Consult for Recovery Care Planning/Counseling  Complete   Palliative Care Screening  Complete   Medication Review Oceanographer) Complete Complete   PCP or Specialist appointment within 3-5 days of discharge Complete    HRI or Home Care Consult Complete    SW Recovery Care/Counseling Consult Complete    Palliative Care Screening Not Applicable    Skilled Nursing Facility Not Applicable

## 2023-10-14 NOTE — Consult Note (Addendum)
 [facility] Psychiatric Consult Initial  Patient Name: [name]  MRN: [id number]  DOB: [birth year]  Consult Order details:  Orders (From admission, onward)     Start     Ordered   10/13/23 1623  IP CONSULT TO PSYCHIATRY       Ordering Provider: Sherrill Alejandro Donovan, DO  Provider:  (Not yet assigned)  Question Answer Comment  Location [facility]   Reason for Consult? Depression      10/13/23 1622             Mode of Visit: In person    Psychiatry Consult Evaluation  Service Date: October 14, 2023 LOS:  LOS: 7 days  Chief Complaint 68 year old male with a long history of CLL who was admitted with a near syncopal episode.  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  Mild anxiety 3.  Chronic pain  Assessment  Tyler Scott is a 68 y.o. male admitted: Medicallyfor 10/07/2023  3:18 PM for syncopal episode. He carries the psychiatric diagnoses of depression and has a past medical history of CLL.   His current presentation of depression, anxiety and frustration with feelings of helplessness is most consistent with depressive disorder due to another medical condition.  He admits to some depression but denies any active suicidal or homicidal ideations or psychosis.  He is contracting for safety he does sometimes feel frustrated especially when he is alone and starts thinking about his chronic illness and begins to wonder when this will end. He feels dependent on others when is unable to do things for himself. Patient reports that he has not refused any treatment and that he has been continuing to take medications as prescribed.  He alternates between chemotherapy and medications and although he is somewhat tired about being on pills, he has not refused any treatments by self-report. He would benefit from outpatient counseling for coping skills and possibly an  SNRI if appropriate.  Depending on his disposition if patient ends up going to SNF, he may need additional support. please see plan below for detailed recommendations.   Diagnoses:  Active Hospital problems: Principal Problem:   Shock (HCC) Active Problems:   Vitamin K deficiency   Tumor lysis syndrome   Symptomatic anemia   Pressure injury of skin    Plan   ## Psychiatric Medication Recommendations:  Consider Zoloft  25 mg a day and titrate to 50 mg a day. Patient is on doxepin  25 mg at night and this can be continued with the Zoloft . An alternative medication is Cymbalta  20 mg a day if his oxygen is going to be discontinued.  ## Medical Decision Making Capacity: Not specifically addressed in this encounter  ## Further Work-up:  -- As per the hospitalist EKG -- most recent EKG on 10/12/2023 had QtC of 445 -- Pertinent labwork reviewed earlier this admission includes: Per the hospitalist   ## Disposition:-- There are no psychiatric contraindications to discharge at this time.  At the time of discharge please consult TOC for possible appointment for outpatient counseling and medication management.  If patient is going to the SNF, appropriate referrals can be made for outpatient follow-up.  ## Behavioral / Environmental: -Utilize compassion and acknowledge the patient's experiences while setting clear and realistic expectations for care.    ## Safety and Observation Level:  - Based on my clinical evaluation, I estimate the patient to be at low risk  of self harm in the current setting. - At this time, we recommend  routine. This decision is based on my review of the chart including patient's history and current presentation, interview of the patient, mental status examination, and consideration of suicide risk including evaluating suicidal ideation, plan, intent, suicidal or self-harm behaviors, risk factors, and protective factors. This judgment is based on our ability to directly  address suicide risk, implement suicide prevention strategies, and develop a safety plan while the patient is in the clinical setting. Please contact our team if there is a concern that risk level has changed.  CSSR Risk Category:C-SSRS RISK CATEGORY: No Risk  Suicide Risk Assessment: Patient has following modifiable risk factors for suicide: untreated depression and social isolation, which we are addressing by medication and treatment. Patient has following non-modifiable or demographic risk factors for suicide: male gender Patient has the following protective factors against suicide: Supportive family, Cultural, spiritual, or religious beliefs that discourage suicide, and no history of suicide attempts  Thank you for this consult request. Recommendations have been communicated to the primary team.  We follow for now at this time.   PAULETTE BEETS, MD       History of Present Illness  Relevant Aspects of [facility] [place] [facility] Course:  Admitted on 10/07/2023 for near syncopal episode and CLL.  He is being seen by the hospitalist and the oncologist.  Patient Report:  The patient is a 68 year old male who was alert oriented and cooperative.  He maintained fair to good eye contact.  He was fairly forthcoming in his information.  He reports that he has had no prior history of major depression and that he has been dealing with his chronic illness that has been quite frustrating to him.  He states that he lives with his wife and has a Museum/gallery conservator at home.  He has been having chemotherapy for his CLL for the last 7 to 8 years.  He is on medical disability and spends his time watching science shows on TV.  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 Today endorses depression active 3-4 out of 10.  He denies any active SI/HI/AVH.  He is contracting for safety.  He has been reporting no concerns other than having to deal with his  chronic condition and wondering how much longer is going to live with this.  He does acknowledge that when he is alone he is a little bit more depressed than when he is with company.  His wife was apparently in an motor vehicle accident and has chronic problems of her all but she is on close the primary caregiver for him.  This concerns him.  He may be willing to go to the SNF once that decision is made.  Psych ROS:  Depression: No prior psychiatric history other than some counseling in the past.  No antidepressants tried. Anxiety: Occasional anxiety. Mania (lifetime and current): Denies any Psychosis: (lifetime and current): Denies  Collateral information:  Contacted Prather Failla, his wife at [phone] on 10/14/2023.  Patient's wife is a good historian and reports that he generally does fairly well but she can tell that he is getting a little depressed.  She has no concerns about him being suicidal or having any unusual thoughts.  Although she is a primary provider for him, she does have significant physical problems of her own and apparently the team is talking about possible referral to  a SNF.   Review of Systems  Psychiatric/Behavioral:  Positive for depression.      Psychiatric and Social History  Psychiatric History:  Information collected from medical records, patient and patient's wife.  Prev Dx/Sx: None noted Current Psych Provider: None Home Meds (current): None Previous Med Trials: None Therapy: Some brief counseling in the past  Prior Psych Hospitalization: None Prior Self Harm: None Prior Violence: None  Family Psych History: Unknown Family Hx suicide: None  Social History:  Patient reports that he was married twice the first marriage lasted for 7 years and he has been in his current marriage for about 24 years.  His wife has 2 children a boy and a girl.  He has no children of his own.  He was in [employer] and worked full-time for several years before his  chronic lymphocytic leukemia.  He is currently on medical disability. Access to weapons/lethal means: Denies  Substance History Patient reports that he was a social drinker in the past.  Currently not drinking at all.  He denies any alcohol  or substance use.  Exam Findings  Physical Exam: As per the hospitalist Vital Signs:  Temp:  [97.7 F (36.5 C)-98.4 F (36.9 C)] 97.8 F (36.6 C) (09/04 0428) Pulse Rate:  [91-105] 105 (09/04 0428) Resp:  [16-18] 16 (09/04 0428) BP: (111-143)/(63-75) 111/75 (09/04 0428) SpO2:  [96 %-98 %] 96 % (09/04 0428) Blood pressure 111/75, pulse (!) 105, temperature 97.8 F (36.6 C), temperature source Oral, resp. rate 16, height 5' 8 (1.727 m), weight 62.2 kg, SpO2 96%. Body mass index is 20.85 kg/m.  Physical Exam Neurological:     General: No focal deficit present.     Mental Status: He is oriented to person, place, and time. Mental status is at baseline.     Mental Status Exam: General Appearance: Casual  Orientation:  Full (Time, Place, and Person)  Memory:  Immediate;   Good Recent;   Good Remote;   Good  Concentration:  Concentration: Fair and Attention Span: Fair  Recall:  Good  Attention  Good  Eye Contact:  Good  Speech:  Slow  Language:  Good  Volume:  Decreased  Mood: Mildly dysthymic  Affect:  Restricted  Thought Process:  Coherent  Thought Content:  Logical  Suicidal Thoughts:  No  Homicidal Thoughts:  No  Judgement:  Fair  Insight:  Fair  Psychomotor Activity:  Normal  Akathisia:  No  Fund of Knowledge:  Good      Assets:  Communication Skills Desire for Improvement Financial Resources/Insurance Housing  Cognition:  WNL  ADL's:  Intact  AIMS (if indicated):        Other History   These have been pulled in through the EMR, reviewed, and updated if appropriate.  Family History:  The patient's family history includes Alcohol  abuse in an other family member; Colon cancer (age of onset: 23) in his brother; Diabetes  Mellitus II in his mother.  Medical History: Past Medical History:  Diagnosis Date   Allergy    Arthritis    CLL (chronic lymphocytic leukemia) (HCC)    stage III: asymptomatic; on observation   DIABETES MELLITUS, TYPE I, UNCONTROLLED 12/30/2006   insulin    Diabetic retinopathy    ERECTILE DYSFUNCTION, ORGANIC 12/30/2006   Hx of adenomatous polyp of colon 05/24/2014   HYPERLIPIDEMIA 12/30/2006   HYPERTENSION 12/30/2006   LOW BACK PAIN, ACUTE 12/21/2006    Surgical History: Past Surgical History:  Procedure Laterality Date  COLONOSCOPY     great right toe- Fused the I P joint       Medications:   Current Facility-Administered Medications:    0.9 %  sodium chloride  infusion (Manually program via Guardrails IV Fluids), , Intravenous, Once, Doretha Folks, MD   acyclovir  (ZOVIRAX ) tablet 400 mg, 400 mg, Oral, Daily, [place], Camellia PARAS, DO, 400 mg at 10/14/23 1023   allopurinol  (ZYLOPRIM ) tablet 150 mg, 150 mg, Oral, Daily, [place], Camellia PARAS, DO, 150 mg at 10/14/23 1023   artificial tears ophthalmic solution 1 drop, 1 drop, Both Eyes, PRN, Olalere, Adewale A, MD, 1 drop at 10/13/23 2128   Chlorhexidine  Gluconate Cloth 2 % PADS 6 each, 6 each, Topical, Daily, Ilah Krabbe M, PA-C, 6 each at 10/14/23 1023   docusate sodium  (COLACE) capsule 100 mg, 100 mg, Oral, BID PRN, Ilah Krabbe M, PA-C, 100 mg at 10/14/23 9171   doxepin  (SINEQUAN ) capsule 25 mg, 25 mg, Oral, QHS, [place], Eric J, DO, 25 mg at 10/13/23 2115   feeding supplement (ENSURE PLUS HIGH PROTEIN) liquid 237 mL, 237 mL, Oral, BID BM, [place], Eric J, DO, 237 mL at 10/14/23 1023   gabapentin  (NEURONTIN ) capsule 100 mg, 100 mg, Oral, TID, [place], Eric J, DO, 100 mg at 10/14/23 1023   guaiFENesin -dextromethorphan  (ROBITUSSIN DM) 100-10 MG/5ML syrup 5 mL, 5 mL, Oral, Q4H PRN, Ogan, Okoronkwo U, MD, 5 mL at 10/09/23 9567   HYDROmorphone  (DILAUDID ) tablet 2 mg, 2 mg, Oral, QHS, [place], Eric J, DO, 2 mg at 10/13/23 2115    insulin  aspart (novoLOG ) injection 0-9 Units, 0-9 Units, Subcutaneous, Q4H, Ilah Krabbe M, PA-C, 1 Units at 10/14/23 9171   lip balm (CARMEX) ointment, , Topical, PRN, Dewald, Jonathan B, MD, Given at 10/08/23 2237   methocarbamol  (ROBAXIN ) tablet 500 mg, 500 mg, Oral, QHS, [place], Camellia PARAS, DO, 500 mg at 10/13/23 2115   morphine  (PF) 2 MG/ML injection 2 mg, 2 mg, Intravenous, Q3H PRN, [place], Camellia PARAS, DO, 2 mg at 10/10/23 1319   ondansetron  (ZOFRAN ) injection 4 mg, 4 mg, Intravenous, Q6H PRN, Ilah, Stephanie M, PA-C   pantoprazole  (PROTONIX ) EC tablet 40 mg, 40 mg, Oral, QHS, Sheikh, Omair Latif, DO, 40 mg at 10/13/23 2115   polyethylene glycol (MIRALAX  / GLYCOLAX ) packet 17 g, 17 g, Oral, Daily PRN, Ilah Krabbe M, PA-C, 17 g at 10/14/23 9561   simvastatin  (ZOCOR ) tablet 20 mg, 20 mg, Oral, q1800, [place], Camellia PARAS, DO, 20 mg at 10/13/23 1722   traMADol  (ULTRAM ) tablet 50 mg, 50 mg, Oral, Q6H PRN, [place], Camellia PARAS, DO, 50 mg at 10/12/23 1736  Allergies: No Known Allergies  PAULETTE BEETS, MD

## 2023-10-14 NOTE — Progress Notes (Signed)
 PROGRESS NOTE    Tyler Scott  FMW:[id number] DOB: Jan 28, 1956 DOA: 10/07/2023 PCP: [facility]   Brief Narrative:  Tyler Scott is a 68 y.o. male with past medical history significant for CLL with recent initiation of chemotherapy, HTN, HLD, DM 1, chronic ITP (baseline Plt 40's) who presented to [facility] ED from the cancer center with complaints of abdominal pain, nausea, poor oral intake, generalized weakness with near syncope and shortness of breath.  Patient was due for next chemotherapy infusion today, 8/28 and when attempting to get to the car had an episode of near syncope with associated shortness of breath and generalized weakness.  EMS was activated in which she was noted to be tachycardic, hypotensive with mild AMS (improved shortly after arrival).  Patient's wife also noted hyperglycemia to the 300s the morning of 10/07/2023 in which she administered insulin .   On ED arrival, patient was afebrile with HR 116, BP 95/67, RR 25, SpO2 94%. Labs were notable for WBC 14.6 (down from 17.1 8/27 with ANC 1100), Hgb 1.7 (8.2), Plt 10 (47). INR > 10 (1 two months PTA). CMP/lipase pending. Trop 46. LA 2.5 > 5.4. VBG 7.21/pCO2 < 18/pO2 < 31/bicarb 5.6. Initial CBG 40. CXR demonstrated small L pleural effusion and L lung base atelectasis with R paratracheal adenoapthyp; CT A/P showed extensive bulky LAD throughout the lower chest, abdomen, pelvis and groin regions (similar from prior), splenic enlargement with peripheral splenic infarct, small lucent lesions in the pelvis c/f lymphomatous involvement, moderate L and small R pleural effusions with atelectasis. Broad-spectrum antibiotics were started, Cx pending. Levophed  started for hypotension. Oncology consulted. PCCM consulted for ICU admission.   Significant hospital events: 8/28: Admit ICU, started on vasopressors, oncology consulted; transfused 2 unit PRBC 8/29: Levophed  titrated off; received rasburicase , 1 unit platelets, 1 unit  PRBC transfusion 8/30: 1 unit platelet transfusion 8/31: Transferred to [facility]; 2 unit platelet transfusion 9/1: 1u platelet transfusion, transfer to 6 E. 9/2: transfuse 1u Plt, to receive NPLATE  per oncology  9/3: SNF Selected  Assessment and Plan:  Shock, undifferentiated Concern for sepsis, ruled out Patient presenting with weakness and noted to be hypotensive, initially admitted to the intensive care unit on vasopressors.  Suspect etiology likely secondary to severe dehydration, poor oral intake with recent chemotherapy infusion as well as tumor lysis syndrome as above.  Initially was started on empiric antibiotics with vancomycin , cefepime  and doxycycline  which were stopped.   Acute hypoxemic respiratory failure: Resolved; Supplemental oxygen weaned off.   CLL/SLL/low-grade lymphoma Tumor lysis syndrome Patient initially diagnosed with CLL 2012. Most recently patient received Cycle 1 Bendamustine  (without Rituxan ) on 09/08/23.  Now he is status post cycle 2 which was started on 10/06/2023.  Imaging 8/29 with extensive bulky lymphadenopathy throughout the lower chest, abdomen, pelvis and groin similar to previous study also with splenic enlargement, noted bone lesions progressive since prior study.  Uric acid 12.3, received rasburicase  on 8/28. -Uric acid 12.3>5.1..<1.5 on last check -Further per oncology and the plan is for Venetoclax  + Rituxan  the patient's platelet counts are better.  Will continue physical therapy efforts and dietary efforts while he is hospitalized.   Acute renal failure: Resolved; Suspect etiology multifactorial with ATN from hypotension/shock as well as prerenal azotemia from severe dehydration.  No hydronephrosis noted on CT abdomen/pelvis at time of admission.  IV fluids now discontinued. BUN/Cr Trend: Recent Labs  Lab 10/08/23 0725 10/09/23 0315 10/10/23 0345 10/11/23 0330 10/12/23 0535 10/13/23 0554 10/14/23 0532  BUN 46* 36*  20 14 10 9 8   CREATININE 2.31*  1.58* 0.92 0.76 0.78 0.66 0.68  -Avoid Nephrotoxic Medications, Contrast Dyes, Hypotension and Dehydration to Ensure Adequate Renal Perfusion and will need to Renally Adjust Meds -Continue to Monitor and Trend Renal Function carefully and repeat CMP in the AM    Normocytic Anemia, severe: Hemoglobin on arrival 1.7; transfused 3 unit PRBCs. Hgb/Hct Trend: Recent Labs  Lab 10/09/23 1609 10/10/23 0345 10/10/23 1447 10/11/23 0330 10/12/23 0535 10/13/23 0554 10/14/23 0532  HGB 10.0* 8.7* 10.4* 8.7* 9.8* 8.5* 8.7*  HCT 30.7* 27.3* 33.4* 26.7* 30.6* 27.6* 27.6*  MCV 90.8 92.2 93.3 93.0 91.3 93.6 93.6  -Transfuse for hemoglobin less than 7.0. CTM for S/Sx of Bleeding; No overt bleeding noted. Repeat CBC in the AM   Thrombocytopenia, severe Platelet count 10K arrival.  Received 1 unit platelet on 8/29, 8/30, 2 u on 8/31, 1u 9/1 -- Plt 10>15>18>>14>18>15>27>18 -> 21 x2 -- Transfuse 1 unit platelets 9/2 -- NPLATE  ordered by oncology 10/12/23 and recommending 4 mcg/kg weekly -Transfuse for counts <20K or <50K with bleeding  -- CBC with diff daily; oncology would like platelet counts 50K or above before stable for discharge -CTM closely for bleeding   Hypokalemia: K+ is now 3.8. CTM and Replete as Necessary. Repeat CMP in the AM  Hypophosphatemia: Phos Level is now 2.2. Replete w/ po K Phos  Neutral 500 mg po BID x2. CTM and Replete as Necessary. Repeat Phos Level in the AMA  Hypomagnesemia: Mag Level is now 2.1. Replete w/ IV Mag Sulfate 4 grams yesterday. CTM and Replete as Necessary. Repeat Mag Level in the AM  Constipation: Give Bisacodyl  Suppository 10 mg x1. Change Docusate 100 mg po BID PRN mild Constipation and Miralax  17 gras po Dailyprn to Scheduled Senna-Docusate 1 tab po BID and Miralax  17 BID   Essential HTN: Patient was notably hypotensive/shock as above at time of arrival but this is improved. CTM BP per protocol. Last BP reading was 136/65  HLD:  C/w Simvastatin  20 mg p.o.  daily   Type 1 diabetes mellitus: On Jardiance  12.5 mg p.o. daily, Lantus  10-30 units [place] Prn at bedtime at baseline. C/w Sensitive SSI for coverage. CTM CBGs per Protocol. CBG Trend:  Recent Labs  Lab 10/13/23 1206 10/13/23 1725 10/13/23 1950 10/13/23 2351 10/14/23 0410 10/14/23 0817 10/14/23 1201  GLUCAP 99 98 119* 101* 80 138* 93    Chronic pain: Patient reportedly on MS Contin  15 mg p.o. nightly, gabapentin  100 mg p.o. 3 times daily Dilaudid  2 mg p.o. nightly Robaxin  500 mg p.o. nightly -- Hold home MS Contin  -- Continue gabapentin  100 mg PO TID -- Continue Dialudid 2 mg PO at bedtime -- Continue Robaxin  500 mg PO at bedtime  Depression and Anxiety: Psychiatry consulted for further evaluation they are recommending considering Zoloft  25 mg and titrating to 50 mg a day eventually;    Weakness/debility/deconditioning/gait disturbance: -- PT currently recommending SNF now to address mobility and functional Decline  -- Spouse reports unable to care for patient currently at home; [facility] consulted and SNF being pursued.    Hypoalbuminemia: Patient's Albumin Trend: Recent Labs  Lab 10/06/23 1218 10/07/23 1539 10/07/23 2151 10/07/23 2341 10/08/23 0725 10/10/23 0345 10/14/23 0532  ALBUMIN 3.5 SPECIMEN DILUTED 3.4* 3.1* 2.9* 2.9* 3.0*  -Continue to Monitor and Trend and repeat CMP in the AM   DVT prophylaxis: SCDs Start: 10/07/23 1835    Code Status: Full Code Family Communication: No family present @ bedside   Disposition  Plan:  Level of care: Telemetry Status is: Inpatient Remains inpatient appropriate because: Needs SNF Bed available, Insurance Auth and clearance by the Specialists    Consultants:  Medical Oncology PCCM Psychiatry   Procedures:  As delineated as above  Antimicrobials:  Anti-infectives (From admission, onward)    Start     Dose/Rate Route Frequency Ordered Stop   10/10/23 1000  acyclovir  (ZOVIRAX ) tablet 400 mg        400 mg Oral Daily 10/10/23  0823     10/07/23 2000  doxycycline  (VIBRAMYCIN ) 100 mg in sodium chloride  0.9 % 250 mL IVPB  Status:  Discontinued        100 mg 125 mL/hr over 120 Minutes Intravenous Every 12 hours 10/07/23 1849 10/09/23 1140   10/07/23 1700  vancomycin  (VANCOREADY) IVPB 1250 mg/250 mL        1,250 mg 166.7 mL/hr over 90 Minutes Intravenous  Once 10/07/23 1625 10/07/23 1919   10/07/23 1630  ceFEPIme  (MAXIPIME ) 2 g in sodium chloride  0.9 % 100 mL IVPB        2 g 200 mL/hr over 30 Minutes Intravenous  Once 10/07/23 1625 10/07/23 1728       Subjective: Seen and examined at bedside and is complaining of being constipated.  Today with her bedside and states that he is very uncomfortable with not having a bowel movement.  No nausea or vomiting.  Feels okay but is still feeling weak.  No other concerns or complaints at this time.  Objective: Vitals:   10/13/23 1948 10/14/23 0409 10/14/23 0428 10/14/23 1456  BP: 138/65 (!) 142/63 111/75 136/65  Pulse: 92 91 (!) 105 90  Resp: 16 18 16    Temp: 98.2 F (36.8 C) 97.7 F (36.5 C) 97.8 F (36.6 C) (!) 97.5 F (36.4 C)  TempSrc: Oral Oral Oral Oral  SpO2: 98% 96% 96% 99%  Weight:      Height:        Intake/Output Summary (Last 24 hours) at 10/14/2023 1647 Last data filed at 10/14/2023 1459 Gross per 24 hour  Intake 100 ml  Output 1000 ml  Net -900 ml   Filed Weights   10/10/23 0700 10/11/23 0500 10/12/23 0706  Weight: 60 kg 63.3 kg 62.2 kg   Examination: Physical Exam:  Constitutional: Thin African-American male in no acute distress sitting in a chair but appears overall comfortable Respiratory: Diminished to auscultation bilaterally, no wheezing, rales, rhonchi or crackles. Normal respiratory effort and patient is not tachypenic. No accessory muscle use.  Unlabored breathing Cardiovascular: RRR, no murmurs / rubs / gallops. S1 and S2 auscultated.  Has some mild pedal edema Abdomen: Soft, non-tender, non-distended. No masses palpated. No appreciable  hepatosplenomegaly. Bowel sounds positive.  GU: Deferred. Musculoskeletal: No clubbing / cyanosis of digits/nails. No joint deformity upper and lower extremities. Skin: No rashes, lesions, ulcers limited skin evaluation. No induration; Warm and dry.  Neurologic: CN 2-12 grossly intact with no focal deficits. Romberg sign and cerebellar reflexes not assessed.  Psychiatric: Normal judgment and insight. Alert and oriented x 3.  Depressed appearing and has a flat affect  Data Reviewed: I have personally reviewed following labs and imaging studies  CBC: Recent Labs  Lab 10/10/23 1447 10/11/23 0330 10/12/23 0535 10/13/23 0554 10/14/23 0532  WBC 14.1* 8.7 9.1 6.7 7.0  NEUTROABS 1.8 1.0* 0.9* 0.8* 1.2*  HGB 10.4* 8.7* 9.8* 8.5* 8.7*  HCT 33.4* 26.7* 30.6* 27.6* 27.6*  MCV 93.3 93.0 91.3 93.6 93.6  PLT 27* 18*  18* 21* 21*   Basic Metabolic Panel: Recent Labs  Lab 10/07/23 2151 10/07/23 2341 10/08/23 0725 10/08/23 1349 10/10/23 0345 10/11/23 0330 10/12/23 0535 10/13/23 0554 10/14/23 0532  NA 134* 132* 135   < > 137 138 141 141 138  K 6.4* 7.3* 4.8   < > 3.6 3.3* 3.9 3.7 3.8  CL 97* 99 98   < > 106 105 107 106 104  CO2 21* 18* 21*   < > 21* 23 24 25 24   GLUCOSE 45* 51* 76   < > 117* 101* 102* 77 81  BUN 42* 44* 46*   < > 20 14 10 9 8   CREATININE 2.48* 2.36* 2.31*   < > 0.92 0.76 0.78 0.66 0.68  CALCIUM  6.1* 5.9* 5.8*   < > 7.0* 7.2* 7.7* 7.6* 7.8*  MG 2.4 2.3 2.4  --   --   --  1.5* 1.4* 2.1  PHOS 8.3* 7.7* 7.5*  --   --   --   --   --  2.2*   < > = values in this interval not displayed.   GFR: Estimated Creatinine Clearance: 78.8 mL/min (by C-G formula based on SCr of 0.68 mg/dL). Liver Function Tests: Recent Labs  Lab 10/07/23 2151 10/07/23 2341 10/08/23 0725 10/10/23 0345 10/14/23 0532  AST 132* 130* 118* 21 20  ALT 36 36 43 20 9  ALKPHOS 160* 148* 131* 109 88  BILITOT 1.3* 1.1 1.2 1.2 1.1  PROT 5.4* 4.9* 4.7* 4.5* 4.6*  ALBUMIN 3.4* 3.1* 2.9* 2.9* 3.0*   No  results for input(s): LIPASE, AMYLASE in the last 168 hours. No results for input(s): AMMONIA in the last 168 hours. Coagulation Profile: Recent Labs  Lab 10/07/23 2151 10/07/23 2341 10/08/23 0725 10/08/23 1027  INR 2.1* 2.1* 2.0* 1.9*   Cardiac Enzymes: No results for input(s): CKTOTAL, CKMB, CKMBINDEX, TROPONINI in the last 168 hours. BNP (last 3 results) No results for input(s): PROBNP in the last 8760 hours. HbA1C: No results for input(s): HGBA1C in the last 72 hours. CBG: Recent Labs  Lab 10/13/23 1950 10/13/23 2351 10/14/23 0410 10/14/23 0817 10/14/23 1201  GLUCAP 119* 101* 80 138* 93   Lipid Profile: No results for input(s): CHOL, HDL, LDLCALC, TRIG, CHOLHDL, LDLDIRECT in the last 72 hours. Thyroid  Function Tests: No results for input(s): TSH, T4TOTAL, FREET4, T3FREE, THYROIDAB in the last 72 hours. Anemia Panel: No results for input(s): VITAMINB12, FOLATE, FERRITIN, TIBC, IRON, RETICCTPCT in the last 72 hours. Sepsis Labs: Recent Labs  Lab 10/07/23 1751 10/07/23 2341  LATICACIDVEN 5.4* 2.3*   Recent Results (from the past 240 hours)  Blood Culture (routine x 2)     Status: Abnormal   Collection Time: 10/07/23  3:39 PM   Specimen: BLOOD RIGHT ARM  Result Value Ref Range Status   Specimen Description   Final    BLOOD RIGHT ARM Performed at [facility], [address] [place], [place] [address]    Special Requests   Final    BOTTLES DRAWN AEROBIC ONLY Blood Culture results may not be optimal due to an inadequate volume of blood received in culture bottles Performed at [facility], [address] [place], [place] [address]    Culture  Setup Time   Final    GRAM POSITIVE COCCI IN CLUSTERS AEROBIC BOTTLE ONLY CRITICAL RESULT CALLED TO, READ BACK BY AND VERIFIED WITH: PHARMD CHRISTINE SHADE ON 10/08/23 @ 1501 BY DRT    Culture (A)  Final    STAPHYLOCOCCUS HOMINIS THE  SIGNIFICANCE OF ISOLATING THIS ORGANISM FROM A SINGLE SET OF BLOOD CULTURES WHEN MULTIPLE SETS ARE DRAWN IS UNCERTAIN. PLEASE NOTIFY THE MICROBIOLOGY DEPARTMENT WITHIN ONE WEEK IF SPECIATION AND SENSITIVITIES ARE REQUIRED. Performed at [facility], [address], [place], [place] [address]    Report Status 10/10/2023 FINAL  Final  Blood Culture ID Panel (Reflexed)     Status: Abnormal   Collection Time: 10/07/23  3:39 PM  Result Value Ref Range Status   Enterococcus faecalis NOT DETECTED NOT DETECTED Final   Enterococcus Faecium NOT DETECTED NOT DETECTED Final   Listeria monocytogenes NOT DETECTED NOT DETECTED Final   Staphylococcus species DETECTED (A) NOT DETECTED Final    Comment: CRITICAL RESULT CALLED TO, READ BACK BY AND VERIFIED WITH: PHARMD CHRISTINE SHADE ON 10/08/23 @ 1501 BY DRT    Staphylococcus aureus (BCID) NOT DETECTED NOT DETECTED Final   Staphylococcus epidermidis NOT DETECTED NOT DETECTED Final   Staphylococcus lugdunensis NOT DETECTED NOT DETECTED Final   Streptococcus species NOT DETECTED NOT DETECTED Final   Streptococcus agalactiae NOT DETECTED NOT DETECTED Final   Streptococcus pneumoniae NOT DETECTED NOT DETECTED Final   Streptococcus pyogenes NOT DETECTED NOT DETECTED Final   A.calcoaceticus-baumannii NOT DETECTED NOT DETECTED Final   Bacteroides fragilis NOT DETECTED NOT DETECTED Final   Enterobacterales NOT DETECTED NOT DETECTED Final   Enterobacter cloacae complex NOT DETECTED NOT DETECTED Final   Escherichia coli NOT DETECTED NOT DETECTED Final   Klebsiella aerogenes NOT DETECTED NOT DETECTED Final   Klebsiella oxytoca NOT DETECTED NOT DETECTED Final   Klebsiella pneumoniae NOT DETECTED NOT DETECTED Final   Proteus species NOT DETECTED NOT DETECTED Final   Salmonella species NOT DETECTED NOT DETECTED Final   Serratia marcescens NOT DETECTED NOT DETECTED Final   Haemophilus influenzae NOT DETECTED NOT DETECTED Final   Neisseria meningitidis NOT  DETECTED NOT DETECTED Final   Pseudomonas aeruginosa NOT DETECTED NOT DETECTED Final   Stenotrophomonas maltophilia NOT DETECTED NOT DETECTED Final   Candida albicans NOT DETECTED NOT DETECTED Final   Candida auris NOT DETECTED NOT DETECTED Final   Candida glabrata NOT DETECTED NOT DETECTED Final   Candida krusei NOT DETECTED NOT DETECTED Final   Candida parapsilosis NOT DETECTED NOT DETECTED Final   Candida tropicalis NOT DETECTED NOT DETECTED Final   Cryptococcus neoformans/gattii NOT DETECTED NOT DETECTED Final    Comment: Performed at [facility], [address] [place], [place] [address]  Respiratory (~20 pathogens) panel by PCR     Status: None   Collection Time: 10/07/23  7:47 PM   Specimen: Nasopharyngeal Swab; Respiratory  Result Value Ref Range Status   Adenovirus NOT DETECTED NOT DETECTED Final   Coronavirus 229E NOT DETECTED NOT DETECTED Final    Comment: (NOTE) The Coronavirus on the Respiratory Panel, DOES NOT test for the novel  Coronavirus (2019 nCoV)    Coronavirus HKU1 NOT DETECTED NOT DETECTED Final   Coronavirus NL63 NOT DETECTED NOT DETECTED Final   Coronavirus OC43 NOT DETECTED NOT DETECTED Final   Metapneumovirus NOT DETECTED NOT DETECTED Final   Rhinovirus / Enterovirus NOT DETECTED NOT DETECTED Final   Influenza A NOT DETECTED NOT DETECTED Final   Influenza B NOT DETECTED NOT DETECTED Final   Parainfluenza Virus 1 NOT DETECTED NOT DETECTED Final   Parainfluenza Virus 2 NOT DETECTED NOT DETECTED Final   Parainfluenza Virus 3 NOT DETECTED NOT DETECTED Final   Parainfluenza Virus 4 NOT DETECTED  NOT DETECTED Final   Respiratory Syncytial Virus NOT DETECTED NOT DETECTED Final   Bordetella pertussis NOT DETECTED NOT DETECTED Final   Bordetella Parapertussis NOT DETECTED NOT DETECTED Final   Chlamydophila pneumoniae NOT DETECTED NOT DETECTED Final   Mycoplasma pneumoniae NOT DETECTED NOT DETECTED Final    Comment: Performed at [facility],  [address] [place], [place] [address]  Resp panel by RT-PCR (RSV, Flu A&B, Covid) Anterior Nasal Swab     Status: None   Collection Time: 10/07/23  7:47 PM   Specimen: Anterior Nasal Swab  Result Value Ref Range Status   SARS Coronavirus 2 by RT PCR NEGATIVE NEGATIVE Final    Comment: (NOTE) SARS-CoV-2 target nucleic acids are NOT DETECTED.  The SARS-CoV-2 RNA is generally detectable in upper respiratory specimens during the acute phase of infection. The lowest concentration of SARS-CoV-2 viral copies this assay can detect is 138 copies/mL. A negative result does not preclude SARS-Cov-2 infection and should not be used as the sole basis for treatment or other patient management decisions. A negative result may occur with  improper specimen collection/handling, submission of specimen other than nasopharyngeal swab, presence of viral mutation(s) within the areas targeted by this assay, and inadequate number of viral copies(<138 copies/mL). A negative result must be combined with clinical observations, patient history, and epidemiological information. The expected result is Negative.  Fact Sheet for Patients:  BloggerCourse.com  Fact Sheet for Healthcare Providers:  SeriousBroker.it  This test is no t yet approved or cleared by the [place]  FDA and  has been authorized for detection and/or diagnosis of SARS-CoV-2 by FDA under an Emergency Use Authorization (EUA). This EUA will remain  in effect (meaning this test can be used) for the duration of the COVID-19 declaration under Section 564(b)(1) of the Act, 21 U.S.C.section 360bbb-3(b)(1), unless the authorization is terminated  or revoked sooner.       Influenza A by PCR NEGATIVE NEGATIVE Final   Influenza B by PCR NEGATIVE NEGATIVE Final    Comment: (NOTE) The Xpert Xpress SARS-CoV-2/FLU/RSV plus assay is intended as an aid in the diagnosis of influenza from Nasopharyngeal  swab specimens and should not be used as a sole basis for treatment. Nasal washings and aspirates are unacceptable for Xpert Xpress SARS-CoV-2/FLU/RSV testing.  Fact Sheet for Patients: BloggerCourse.com  Fact Sheet for Healthcare Providers: SeriousBroker.it  This test is not yet approved or cleared by the [place]  FDA and has been authorized for detection and/or diagnosis of SARS-CoV-2 by FDA under an Emergency Use Authorization (EUA). This EUA will remain in effect (meaning this test can be used) for the duration of the COVID-19 declaration under Section 564(b)(1) of the Act, 21 U.S.C. section 360bbb-3(b)(1), unless the authorization is terminated or revoked.     Resp Syncytial Virus by PCR NEGATIVE NEGATIVE Final    Comment: (NOTE) Fact Sheet for Patients: BloggerCourse.com  Fact Sheet for Healthcare Providers: SeriousBroker.it  This test is not yet approved or cleared by the [place]  FDA and has been authorized for detection and/or diagnosis of SARS-CoV-2 by FDA under an Emergency Use Authorization (EUA). This EUA will remain in effect (meaning this test can be used) for the duration of the COVID-19 declaration under Section 564(b)(1) of the Act, 21 U.S.C. section 360bbb-3(b)(1), unless the authorization is terminated or revoked.  Performed at [facility], [address], [place], [place] [address]   Blood Culture (routine x 2)     Status: None  Collection Time: 10/07/23  9:51 PM   Specimen: BLOOD LEFT ARM  Result Value Ref Range Status   Specimen Description   Final    BLOOD LEFT ARM Performed at [facility], [address] [place], [place] [address]    Special Requests   Final    BOTTLES DRAWN AEROBIC AND ANAEROBIC Blood Culture results may not be optimal due to an inadequate volume of blood received in culture  bottles Performed at [facility], [address], [place], [place] [address]    Culture   Final    NO GROWTH 5 DAYS Performed at [facility], [address] [place], [place] [address]    Report Status 10/13/2023 FINAL  Final  MRSA Next Gen by PCR, Nasal     Status: None   Collection Time: 10/07/23 10:01 PM   Specimen: Nasal Mucosa; Nasal Swab  Result Value Ref Range Status   MRSA by PCR Next Gen NOT DETECTED NOT DETECTED Final    Comment: (NOTE) The GeneXpert MRSA Assay (FDA approved for NASAL specimens only), is one component of a comprehensive MRSA colonization surveillance program. It is not intended to diagnose MRSA infection nor to guide or monitor treatment for MRSA infections. Test performance is not FDA approved in patients less than 21 years old. Performed at [facility], [address], [place], [place] [address]     Radiology Studies: No results found.  Scheduled Meds:  sodium chloride    Intravenous Once   acyclovir   400 mg Oral Daily   allopurinol   150 mg Oral Daily   Chlorhexidine  Gluconate Cloth  6 each Topical Daily   doxepin   25 mg Oral QHS   feeding supplement  237 mL Oral BID BM   gabapentin   100 mg Oral TID   HYDROmorphone   2 mg Oral QHS   insulin  aspart  0-9 Units Subcutaneous Q4H   methocarbamol   500 mg Oral QHS   pantoprazole   40 mg Oral QHS   phosphorus  500 mg Oral BID   polyethylene glycol  17 g Oral BID   senna-docusate  1 tablet Oral BID   sertraline   25 mg Oral Daily   simvastatin   20 mg Oral q1800   Continuous Infusions:   LOS: 7 days   Alejandro Marker, DO [facility] Available via Epic secure chat 7am-7pm After these hours, please refer to coverage provider listed on [email or web address] 10/14/2023, 4:47 PM

## 2023-10-15 DIAGNOSIS — D693 Immune thrombocytopenic purpura: Secondary | ICD-10-CM | POA: Diagnosis not present

## 2023-10-15 DIAGNOSIS — C9110 Chronic lymphocytic leukemia of B-cell type not having achieved remission: Secondary | ICD-10-CM | POA: Diagnosis not present

## 2023-10-15 DIAGNOSIS — D649 Anemia, unspecified: Secondary | ICD-10-CM | POA: Diagnosis not present

## 2023-10-15 DIAGNOSIS — F4323 Adjustment disorder with mixed anxiety and depressed mood: Secondary | ICD-10-CM | POA: Diagnosis present

## 2023-10-15 LAB — COMPREHENSIVE METABOLIC PANEL WITH GFR
ALT: 10 U/L (ref 0–44)
AST: 21 U/L (ref 15–41)
Albumin: 3.1 g/dL — ABNORMAL LOW (ref 3.5–5.0)
Alkaline Phosphatase: 87 U/L (ref 38–126)
Anion gap: 11 (ref 5–15)
BUN: 10 mg/dL (ref 8–23)
CO2: 25 mmol/L (ref 22–32)
Calcium: 8.1 mg/dL — ABNORMAL LOW (ref 8.9–10.3)
Chloride: 105 mmol/L (ref 98–111)
Creatinine, Ser: 0.74 mg/dL (ref 0.61–1.24)
GFR, Estimated: >60 mL/min (ref >60)
Glucose, Bld: 89 mg/dL (ref 70–99)
Potassium: 3.5 mmol/L (ref 3.5–5.1)
Sodium: 140 mmol/L (ref 135–145)
Total Bilirubin: 1.1 mg/dL (ref 0.0–1.2)
Total Protein: 4.5 g/dL — ABNORMAL LOW (ref 6.5–8.1)

## 2023-10-15 LAB — GLUCOSE, CAPILLARY
Glucose-Capillary: 115 mg/dL — ABNORMAL HIGH (ref 70–99)
Glucose-Capillary: 140 mg/dL — ABNORMAL HIGH (ref 70–99)
Glucose-Capillary: 148 mg/dL — ABNORMAL HIGH (ref 70–99)
Glucose-Capillary: 158 mg/dL — ABNORMAL HIGH (ref 70–99)
Glucose-Capillary: 87 mg/dL (ref 70–99)
Glucose-Capillary: 91 mg/dL (ref 70–99)
Glucose-Capillary: 98 mg/dL (ref 70–99)

## 2023-10-15 LAB — CBC WITH DIFFERENTIAL/PLATELET
Abs Immature Granulocytes: 0.05 K/uL (ref 0.00–0.07)
Basophils Absolute: 0.1 K/uL (ref 0.0–0.1)
Basophils Relative: 1 %
Eosinophils Absolute: 0.1 K/uL (ref 0.0–0.5)
Eosinophils Relative: 1 %
HCT: 26.6 % — ABNORMAL LOW (ref 39.0–52.0)
Hemoglobin: 8.3 g/dL — ABNORMAL LOW (ref 13.0–17.0)
Immature Granulocytes: 1 %
Lymphocytes Relative: 70 %
Lymphs Abs: 4.2 K/uL — ABNORMAL HIGH (ref 0.7–4.0)
MCH: 28.8 pg (ref 26.0–34.0)
MCHC: 31.2 g/dL (ref 30.0–36.0)
MCV: 92.4 fL (ref 80.0–100.0)
Monocytes Absolute: 0.7 K/uL (ref 0.1–1.0)
Monocytes Relative: 12 %
Neutro Abs: 0.9 K/uL — ABNORMAL LOW (ref 1.7–7.7)
Neutrophils Relative %: 15 %
Platelets: 21 K/uL — CL (ref 150–400)
RBC: 2.88 MIL/uL — ABNORMAL LOW (ref 4.22–5.81)
RDW: 17.4 % — ABNORMAL HIGH (ref 11.5–15.5)
Smear Review: NORMAL
WBC: 6.1 K/uL (ref 4.0–10.5)
nRBC: 0.3 % — ABNORMAL HIGH (ref 0.0–0.2)

## 2023-10-15 LAB — PHOSPHORUS: Phosphorus: 3 mg/dL (ref 2.5–4.6)

## 2023-10-15 LAB — MAGNESIUM: Magnesium: 1.9 mg/dL (ref 1.7–2.4)

## 2023-10-15 MED ORDER — POTASSIUM CHLORIDE CRYS ER 20 MEQ PO TBCR
40.0000 meq | EXTENDED_RELEASE_TABLET | Freq: Once | ORAL | Status: AC
  Administered 2023-10-15: 40 meq via ORAL
  Filled 2023-10-15: qty 2

## 2023-10-15 NOTE — Progress Notes (Signed)
 Brief Oncology Note: (N/C) 10/15/2023  CBC checked today:  WBC 6.1/HGB 8.3/PLT 21K. Platelets have been >20K x 3 days to date. Discussed with Dr. Onesimo today:  patient okay for discharge from Onc viewpoint.  Patient will be set up for weekly Nplate  and PRBC transfusion support with labs weekly starting from 9/9.   Plan to start Venetoclax  in 2 weeks.   [name], NP

## 2023-10-15 NOTE — Progress Notes (Signed)
 PROGRESS NOTE    Tyler Scott  FMW:[id number] DOB: Sep 11, 1955 DOA: 10/07/2023 PCP: [facility]   Brief Narrative:  Tyler Scott is a 68 y.o. male with past medical history significant for CLL with recent initiation of chemotherapy, HTN, HLD, DM 1, chronic ITP (baseline Plt 40's) who presented to [facility] ED from the cancer center with complaints of abdominal pain, nausea, poor oral intake, generalized weakness with near syncope and shortness of breath.  Patient was due for next chemotherapy infusion today, 8/28 and when attempting to get to the car had an episode of near syncope with associated shortness of breath and generalized weakness.  EMS was activated in which she was noted to be tachycardic, hypotensive with mild AMS (improved shortly after arrival).  Patient's wife also noted hyperglycemia to the 300s the morning of 10/07/2023 in which she administered insulin .   On ED arrival, patient was afebrile with HR 116, BP 95/67, RR 25, SpO2 94%. Labs were notable for WBC 14.6 (down from 17.1 8/27 with ANC 1100), Hgb 1.7 (8.2), Plt 10 (47). INR > 10 (1 two months PTA). CMP/lipase pending. Trop 46. LA 2.5 > 5.4. VBG 7.21/pCO2 < 18/pO2 < 31/bicarb 5.6. Initial CBG 40. CXR demonstrated small L pleural effusion and L lung base atelectasis with R paratracheal adenoapthyp; CT A/P showed extensive bulky LAD throughout the lower chest, abdomen, pelvis and groin regions (similar from prior), splenic enlargement with peripheral splenic infarct, small lucent lesions in the pelvis c/f lymphomatous involvement, moderate L and small R pleural effusions with atelectasis. Broad-spectrum antibiotics were started, Cx pending. Levophed  started for hypotension. Oncology consulted. PCCM consulted for ICU admission.   Significant hospital events: 8/28: Admit ICU, started on vasopressors, oncology consulted; transfused 2 unit PRBC 8/29: Levophed  titrated off; received rasburicase , 1 unit platelets, 1 unit  PRBC transfusion 8/30: 1 unit platelet transfusion 8/31: Transferred to [facility]; 2 unit platelet transfusion 9/1: 1u platelet transfusion, transfer to 6 E. 9/2: transfuse 1u Plt, to receive NPLATE  per oncology  9/3: SNF Selected and [facility] on [facility] for SN[facility]  Assessment and Plan:  Shock, undifferentiated Concern for sepsis, ruled out Patient presenting with weakness and noted to be hypotensive, initially admitted to the intensive care unit on vasopressors.  Suspect etiology likely secondary to severe dehydration, poor oral intake with recent chemotherapy infusion as well as tumor lysis syndrome as above.  Initially was started on empiric antibiotics with vancomycin , cefepime  and doxycycline  which were stopped.   Acute hypoxemic respiratory failure: Resolved; Supplemental oxygen weaned off.   CLL/SLL/low-grade lymphoma Tumor lysis syndrome Patient initially diagnosed with CLL 2012. Most recently patient received Cycle 1 Bendamustine  (without Rituxan ) on 09/08/23.  Now he is status post cycle 2 which was started on 10/06/2023.  Imaging 8/29 with extensive bulky lymphadenopathy throughout the lower chest, abdomen, pelvis and groin similar to previous study also with splenic enlargement, noted bone lesions progressive since prior study.  Uric acid 12.3, received rasburicase  on 8/28. -Uric acid 12.3>5.1..<1.5 on last check -Further per oncology and the plan is for Venetoclax  + Rituxan  the patient's platelet counts are better.  Will continue physical therapy efforts and dietary efforts while he is hospitalized and PT/OT recommending SNF.   Acute renal failure: Resolved; Suspect etiology multifactorial with ATN from hypotension/shock as well as prerenal azotemia from severe dehydration.  No hydronephrosis noted on CT abdomen/pelvis at time of admission.  IV fluids now discontinued and he was given IV Lasix  20 mg x1  due to pedal edema and he is +4.8785 Liters since admit. BUN/Cr  Trend: Recent Labs  Lab 10/09/23 0315 10/10/23 0345 10/11/23 0330 10/12/23 0535 10/13/23 0554 10/14/23 0532 10/15/23 0526  BUN 36* 20 14 10 9 8 10   CREATININE 1.58* 0.92 0.76 0.78 0.66 0.68 0.74  -Avoid Nephrotoxic Medications, Contrast Dyes, Hypotension and Dehydration to Ensure Adequate Renal Perfusion and will need to Renally Adjust Meds -Continue to Monitor and Trend Renal Function carefully and repeat CMP in the AM    Normocytic Anemia, severe: Hemoglobin on arrival 1.7; transfused 3 unit PRBCs. Hgb/Hct Trend: Recent Labs  Lab 10/10/23 0345 10/10/23 1447 10/11/23 0330 10/12/23 0535 10/13/23 0554 10/14/23 0532 10/15/23 0526  HGB 8.7* 10.4* 8.7* 9.8* 8.5* 8.7* 8.3*  HCT 27.3* 33.4* 26.7* 30.6* 27.6* 27.6* 26.6*  MCV 92.2 93.3 93.0 91.3 93.6 93.6 92.4  -Transfuse for hemoglobin less than 7.0. CTM for S/Sx of Bleeding; No overt bleeding noted. Repeat CBC in the AM   Thrombocytopenia, severe Platelet count 10K arrival.  Received 1 unit platelet on 8/29, 8/30, 2 u on 8/31, 1u 9/1 -- Plt 10>15>18>>14>18>15>27>18 -> 21 x3 -- Transfuse 1 unit platelets 9/2 -- NPLATE  ordered by oncology 10/12/23 and recommending 4 mcg/kg weekly -Transfuse for counts <20K or <50K with bleeding  -- CBC with diff daily; for further discussion with oncology they feel that he is okay from their point of view to discharge the patient given that his platelet count has been above 20,000 x 3 and he will be set up with weekly Nplate  and PRBC transfusion support with labs weekly starting on 10/19/2023.  He is supposed to start Venetoclax  in 2 weeks -CTM closely for bleeding   Hypokalemia: K+ is now 3.5. Replete w/ po Kcl 40 mEQ x1. CTM and Replete as Necessary. Repeat CMP in the AM  Hypophosphatemia: Phos Level is now 3.0. CTM and Replete as Necessary. Repeat Phos Level in the AMA  Hypomagnesemia: Mag Level is now 1.9. CTM and Replete as Necessary. Repeat Mag Level in the AM  Constipation: Improved. Give  Bisacodyl  Suppository 10 mg x1. Change Docusate 100 mg po BID PRN mild Constipation and Miralax  17 gras po Dailyprn to Scheduled Senna-Docusate 1 tab po BID and Miralax  17 BID   Essential HTN: Patient was notably hypotensive/shock as above at time of arrival but this is improved. CTM BP per protocol. Last BP reading was 149/73  HLD:  C/w Simvastatin  20 mg p.o. daily   Type 1 diabetes mellitus: On Jardiance  12.5 mg p.o. daily, Lantus  10-30 units  Prn at bedtime at baseline. C/w Sensitive SSI for coverage. CTM CBGs per Protocol. CBG Trend:  Recent Labs  Lab 10/14/23 1717 10/14/23 2008 10/15/23 0007 10/15/23 0424 10/15/23 0742 10/15/23 1214 10/15/23 1633  GLUCAP 137* 77 148* 98 87 115* 158*    Chronic pain: Patient reportedly on MS Contin  15 mg p.o. nightly, gabapentin  100 mg p.o. 3 times daily Dilaudid  2 mg p.o. nightly Robaxin  500 mg p.o. nightly -Hold home MS Contin  -Continue Gabapentin  100 mg PO TID -Continue Hydromorphone  2 mg PO at bedtime -Continue Robaxin  500 mg PO at bedtime  Depression and Anxiety: Psychiatry consulted for further evaluation they are recommending sertraline  25 mg p.o. daily and then titrating to 50 mg a day.  Patient remains on doxepin  25 mg nightly   Weakness/Debility/Deconditioning/Gait Disturbance: -PT currently recommending SNF now to address mobility and functional Decline  -Spouse reports unable to care for patient currently at home; [facility] consulted and SNF  being pursued.    Hypoalbuminemia: Patient's Albumin Lvl has gone from 3.4 -> 3.1 -> 2.9 x2 -> 3.0 -> 3.1. CTM and Trend and repeat CMP in the AM   DVT prophylaxis: SCDs Start: 10/07/23 1835    Code Status: Full Code Family Communication: No family currently at bedside  Disposition Plan:  Level of care: Telemetry Status is: Inpatient Remains inpatient appropriate because: Has been cleared by oncology from their standpoint for discharge and SNF has been selected however awaiting insurance  authorization by the [facility]   Consultants:  Medical Oncology PCCM Psychiatry   Procedures:  As delineated as above  Antimicrobials:  Anti-infectives (From admission, onward)    Start     Dose/Rate Route Frequency Ordered Stop   10/10/23 1000  acyclovir  (ZOVIRAX ) tablet 400 mg        400 mg Oral Daily 10/10/23 0823     10/07/23 2000  doxycycline  (VIBRAMYCIN ) 100 mg in sodium chloride  0.9 % 250 mL IVPB  Status:  Discontinued        100 mg 125 mL/hr over 120 Minutes Intravenous Every 12 hours 10/07/23 1849 10/09/23 1140   10/07/23 1700  vancomycin  (VANCOREADY) IVPB 1250 mg/250 mL        1,250 mg 166.7 mL/hr over 90 Minutes Intravenous  Once 10/07/23 1625 10/07/23 1919   10/07/23 1630  ceFEPIme  (MAXIPIME ) 2 g in sodium chloride  0.9 % 100 mL IVPB        2 g 200 mL/hr over 30 Minutes Intravenous  Once 10/07/23 1625 10/07/23 1728       Subjective: Seen and examined at bedside and was feeling little bit better after having a bowel movement.  No nausea or vomiting.  Still feels weak.  Awaiting insurance authorization by the [place].  Denies any new concerns or complaints this time.  Objective: Vitals:   10/14/23 2006 10/15/23 0423 10/15/23 0436 10/15/23 1401  BP: 126/63 133/63  (!) 149/73  Pulse: 92 95  (!) 105  Resp: 18 18  20   Temp: 98.2 F (36.8 C) 98 F (36.7 C)  98.3 F (36.8 C)  TempSrc: Oral Oral  Oral  SpO2: 99% 96%  98%  Weight:   58.1 kg   Height:        Intake/Output Summary (Last 24 hours) at 10/15/2023 1659 Last data filed at 10/15/2023 1042 Gross per 24 hour  Intake 240 ml  Output 1100 ml  Net -860 ml   Filed Weights   10/11/23 0500 10/12/23 0706 10/15/23 0436  Weight: 63.3 kg 62.2 kg 58.1 kg   Examination: Physical Exam:  Constitutional: Thin African-American male in no acute distress laying in bed Respiratory: Diminished to auscultation bilaterally, no wheezing, rales, rhonchi or crackles. Normal respiratory effort and patient is not tachypenic. No accessory  muscle use.  Unlabored breathing Cardiovascular: RRR, no murmurs / rubs / gallops. S1 and S2 auscultated.  Has some pedal edema noted Abdomen: Soft, non-tender, non-distended. \Bowel sounds positive.  GU: Deferred. Musculoskeletal: No clubbing / cyanosis of digits/nails. No joint deformity upper and lower extremities.  Skin: No rashes, lesions, ulcers on limited skin evaluation. No induration; Warm and dry.  Neurologic: CN 2-12 grossly intact with no focal deficits. Romberg sign and cerebellar reflexes not assessed.  Psychiatric: Normal judgment and insight. Alert and oriented x 3.  Appears calm  Data Reviewed: I have personally reviewed following labs and imaging studies  CBC: Recent Labs  Lab 10/11/23 0330 10/12/23 0535 10/13/23 0554 10/14/23 0532 10/15/23 9473  WBC 8.7 9.1 6.7 7.0 6.1  NEUTROABS 1.0* 0.9* 0.8* 1.2* 0.9*  HGB 8.7* 9.8* 8.5* 8.7* 8.3*  HCT 26.7* 30.6* 27.6* 27.6* 26.6*  MCV 93.0 91.3 93.6 93.6 92.4  PLT 18* 18* 21* 21* 21*   Basic Metabolic Panel: Recent Labs  Lab 10/11/23 0330 10/12/23 0535 10/13/23 0554 10/14/23 0532 10/15/23 0526  NA 138 141 141 138 140  K 3.3* 3.9 3.7 3.8 3.5  CL 105 107 106 104 105  CO2 23 24 25 24 25   GLUCOSE 101* 102* 77 81 89  BUN 14 10 9 8 10   CREATININE 0.76 0.78 0.66 0.68 0.74  CALCIUM  7.2* 7.7* 7.6* 7.8* 8.1*  MG  --  1.5* 1.4* 2.1 1.9  PHOS  --   --   --  2.2* 3.0   GFR: Estimated Creatinine Clearance: 73.6 mL/min (by C-G formula based on SCr of 0.74 mg/dL). Liver Function Tests: Recent Labs  Lab 10/10/23 0345 10/14/23 0532 10/15/23 0526  AST 21 20 21   ALT 20 9 10   ALKPHOS 109 88 87  BILITOT 1.2 1.1 1.1  PROT 4.5* 4.6* 4.5*  ALBUMIN 2.9* 3.0* 3.1*   No results for input(s): LIPASE, AMYLASE in the last 168 hours. No results for input(s): AMMONIA in the last 168 hours. Coagulation Profile: No results for input(s): INR, PROTIME in the last 168 hours. Cardiac Enzymes: No results for input(s):  CKTOTAL, CKMB, CKMBINDEX, TROPONINI in the last 168 hours. BNP (last 3 results) No results for input(s): PROBNP in the last 8760 hours. HbA1C: No results for input(s): HGBA1C in the last 72 hours. CBG: Recent Labs  Lab 10/15/23 0007 10/15/23 0424 10/15/23 0742 10/15/23 1214 10/15/23 1633  GLUCAP 148* 98 87 115* 158*   Lipid Profile: No results for input(s): CHOL, HDL, LDLCALC, TRIG, CHOLHDL, LDLDIRECT in the last 72 hours. Thyroid  Function Tests: No results for input(s): TSH, T4TOTAL, FREET4, T3FREE, THYROIDAB in the last 72 hours. Anemia Panel: No results for input(s): VITAMINB12, FOLATE, FERRITIN, TIBC, IRON, RETICCTPCT in the last 72 hours. Sepsis Labs: No results for input(s): PROCALCITON, LATICACIDVEN in the last 168 hours.  Recent Results (from the past 240 hours)  Blood Culture (routine x 2)     Status: Abnormal   Collection Time: 10/07/23  3:39 PM   Specimen: BLOOD RIGHT ARM  Result Value Ref Range Status   Specimen Description   Final    BLOOD RIGHT ARM Performed at [facility], [address] [place], [place] [address]    Special Requests   Final    BOTTLES DRAWN AEROBIC ONLY Blood Culture results may not be optimal due to an inadequate volume of blood received in culture bottles Performed at [facility], [address] [place], [place] [address]    Culture  Setup Time   Final    GRAM POSITIVE COCCI IN CLUSTERS AEROBIC BOTTLE ONLY CRITICAL RESULT CALLED TO, READ BACK BY AND VERIFIED WITH: PHARMD CHRISTINE SHADE ON 10/08/23 @ 1501 BY DRT    Culture (A)  Final    STAPHYLOCOCCUS HOMINIS THE SIGNIFICANCE OF ISOLATING THIS ORGANISM FROM A SINGLE SET OF BLOOD CULTURES WHEN MULTIPLE SETS ARE DRAWN IS UNCERTAIN. PLEASE NOTIFY THE MICROBIOLOGY DEPARTMENT WITHIN ONE WEEK IF SPECIATION AND SENSITIVITIES ARE REQUIRED. Performed at [facility], [address], [place], [place] [address]     Report Status 10/10/2023 FINAL  Final  Blood Culture ID Panel (Reflexed)     Status: Abnormal   Collection Time:  10/07/23  3:39 PM  Result Value Ref Range Status   Enterococcus faecalis NOT DETECTED NOT DETECTED Final   Enterococcus Faecium NOT DETECTED NOT DETECTED Final   Listeria monocytogenes NOT DETECTED NOT DETECTED Final   Staphylococcus species DETECTED (A) NOT DETECTED Final    Comment: CRITICAL RESULT CALLED TO, READ BACK BY AND VERIFIED WITH: PHARMD CHRISTINE SHADE ON 10/08/23 @ 1501 BY DRT    Staphylococcus aureus (BCID) NOT DETECTED NOT DETECTED Final   Staphylococcus epidermidis NOT DETECTED NOT DETECTED Final   Staphylococcus lugdunensis NOT DETECTED NOT DETECTED Final   Streptococcus species NOT DETECTED NOT DETECTED Final   Streptococcus agalactiae NOT DETECTED NOT DETECTED Final   Streptococcus pneumoniae NOT DETECTED NOT DETECTED Final   Streptococcus pyogenes NOT DETECTED NOT DETECTED Final   A.calcoaceticus-baumannii NOT DETECTED NOT DETECTED Final   Bacteroides fragilis NOT DETECTED NOT DETECTED Final   Enterobacterales NOT DETECTED NOT DETECTED Final   Enterobacter cloacae complex NOT DETECTED NOT DETECTED Final   Escherichia coli NOT DETECTED NOT DETECTED Final   Klebsiella aerogenes NOT DETECTED NOT DETECTED Final   Klebsiella oxytoca NOT DETECTED NOT DETECTED Final   Klebsiella pneumoniae NOT DETECTED NOT DETECTED Final   Proteus species NOT DETECTED NOT DETECTED Final   Salmonella species NOT DETECTED NOT DETECTED Final   Serratia marcescens NOT DETECTED NOT DETECTED Final   Haemophilus influenzae NOT DETECTED NOT DETECTED Final   Neisseria meningitidis NOT DETECTED NOT DETECTED Final   Pseudomonas aeruginosa NOT DETECTED NOT DETECTED Final   Stenotrophomonas maltophilia NOT DETECTED NOT DETECTED Final   Candida albicans NOT DETECTED NOT DETECTED Final   Candida auris NOT DETECTED NOT DETECTED Final   Candida glabrata NOT DETECTED NOT DETECTED Final    Candida krusei NOT DETECTED NOT DETECTED Final   Candida parapsilosis NOT DETECTED NOT DETECTED Final   Candida tropicalis NOT DETECTED NOT DETECTED Final   Cryptococcus neoformans/gattii NOT DETECTED NOT DETECTED Final    Comment: Performed at [facility], [address], [place], [place] [address]  Respiratory (~20 pathogens) panel by PCR     Status: None   Collection Time: 10/07/23  7:47 PM   Specimen: Nasopharyngeal Swab; Respiratory  Result Value Ref Range Status   Adenovirus NOT DETECTED NOT DETECTED Final   Coronavirus 229E NOT DETECTED NOT DETECTED Final    Comment: (NOTE) The Coronavirus on the Respiratory Panel, DOES NOT test for the novel  Coronavirus (2019 nCoV)    Coronavirus HKU1 NOT DETECTED NOT DETECTED Final   Coronavirus NL63 NOT DETECTED NOT DETECTED Final   Coronavirus OC43 NOT DETECTED NOT DETECTED Final   Metapneumovirus NOT DETECTED NOT DETECTED Final   Rhinovirus / Enterovirus NOT DETECTED NOT DETECTED Final   Influenza A NOT DETECTED NOT DETECTED Final   Influenza B NOT DETECTED NOT DETECTED Final   Parainfluenza Virus 1 NOT DETECTED NOT DETECTED Final   Parainfluenza Virus 2 NOT DETECTED NOT DETECTED Final   Parainfluenza Virus 3 NOT DETECTED NOT DETECTED Final   Parainfluenza Virus 4 NOT DETECTED NOT DETECTED Final   Respiratory Syncytial Virus NOT DETECTED NOT DETECTED Final   Bordetella pertussis NOT DETECTED NOT DETECTED Final   Bordetella Parapertussis NOT DETECTED NOT DETECTED Final   Chlamydophila pneumoniae NOT DETECTED NOT DETECTED Final   Mycoplasma pneumoniae NOT DETECTED NOT DETECTED Final    Comment: Performed at [facility], [address], [place], [place] [address]  Resp panel by RT-PCR (RSV, Flu A&B, Covid) Anterior Nasal Swab  Status: None   Collection Time: 10/07/23  7:47 PM   Specimen: Anterior Nasal Swab  Result Value Ref Range Status   SARS Coronavirus 2 by RT PCR NEGATIVE NEGATIVE Final    Comment:  (NOTE) SARS-CoV-2 target nucleic acids are NOT DETECTED.  The SARS-CoV-2 RNA is generally detectable in upper respiratory specimens during the acute phase of infection. The lowest concentration of SARS-CoV-2 viral copies this assay can detect is 138 copies/mL. A negative result does not preclude SARS-Cov-2 infection and should not be used as the sole basis for treatment or other patient management decisions. A negative result may occur with  improper specimen collection/handling, submission of specimen other than nasopharyngeal swab, presence of viral mutation(s) within the areas targeted by this assay, and inadequate number of viral copies(<138 copies/mL). A negative result must be combined with clinical observations, patient history, and epidemiological information. The expected result is Negative.  Fact Sheet for Patients:  BloggerCourse.com  Fact Sheet for Healthcare Providers:  SeriousBroker.it  This test is no t yet approved or cleared by the [place]  FDA and  has been authorized for detection and/or diagnosis of SARS-CoV-2 by FDA under an Emergency Use Authorization (EUA). This EUA will remain  in effect (meaning this test can be used) for the duration of the COVID-19 declaration under Section 564(b)(1) of the Act, 21 U.S.C.section 360bbb-3(b)(1), unless the authorization is terminated  or revoked sooner.       Influenza A by PCR NEGATIVE NEGATIVE Final   Influenza B by PCR NEGATIVE NEGATIVE Final    Comment: (NOTE) The Xpert Xpress SARS-CoV-2/FLU/RSV plus assay is intended as an aid in the diagnosis of influenza from Nasopharyngeal swab specimens and should not be used as a sole basis for treatment. Nasal washings and aspirates are unacceptable for Xpert Xpress SARS-CoV-2/FLU/RSV testing.  Fact Sheet for Patients: BloggerCourse.com  Fact Sheet for Healthcare  Providers: SeriousBroker.it  This test is not yet approved or cleared by the [place]  FDA and has been authorized for detection and/or diagnosis of SARS-CoV-2 by FDA under an Emergency Use Authorization (EUA). This EUA will remain in effect (meaning this test can be used) for the duration of the COVID-19 declaration under Section 564(b)(1) of the Act, 21 U.S.C. section 360bbb-3(b)(1), unless the authorization is terminated or revoked.     Resp Syncytial Virus by PCR NEGATIVE NEGATIVE Final    Comment: (NOTE) Fact Sheet for Patients: BloggerCourse.com  Fact Sheet for Healthcare Providers: SeriousBroker.it  This test is not yet approved or cleared by the [place]  FDA and has been authorized for detection and/or diagnosis of SARS-CoV-2 by FDA under an Emergency Use Authorization (EUA). This EUA will remain in effect (meaning this test can be used) for the duration of the COVID-19 declaration under Section 564(b)(1) of the Act, 21 U.S.C. section 360bbb-3(b)(1), unless the authorization is terminated or revoked.  Performed at [facility], [address] [place], [place] [address]   Blood Culture (routine x 2)     Status: None   Collection Time: 10/07/23  9:51 PM   Specimen: BLOOD LEFT ARM  Result Value Ref Range Status   Specimen Description   Final    BLOOD LEFT ARM Performed at [facility], [address], [place], [place] [address]    Special Requests   Final    BOTTLES DRAWN AEROBIC AND ANAEROBIC Blood Culture results may not be optimal due to an inadequate volume of blood received in culture bottles Performed at [facility], [address], [place], [place] [address]    Culture   Final    NO GROWTH 5 DAYS Performed at [facility], [address] [place], [place] [address]    Report Status 10/13/2023 FINAL  Final  MRSA Next Gen by  PCR, Nasal     Status: None   Collection Time: 10/07/23 10:01 PM   Specimen: Nasal Mucosa; Nasal Swab  Result Value Ref Range Status   MRSA by PCR Next Gen NOT DETECTED NOT DETECTED Final    Comment: (NOTE) The GeneXpert MRSA Assay (FDA approved for NASAL specimens only), is one component of a comprehensive MRSA colonization surveillance program. It is not intended to diagnose MRSA infection nor to guide or monitor treatment for MRSA infections. Test performance is not FDA approved in patients less than 68 years old. Performed at [facility], [address], [place], [place] [address]     Radiology Studies: No results found.  Scheduled Meds:  sodium chloride    Intravenous Once   acyclovir   400 mg Oral Daily   allopurinol   150 mg Oral Daily   Chlorhexidine  Gluconate Cloth  6 each Topical Daily   doxepin   25 mg Oral QHS   feeding supplement  237 mL Oral BID BM   gabapentin   100 mg Oral TID   HYDROmorphone   2 mg Oral QHS   insulin  aspart  0-9 Units Subcutaneous Q4H   methocarbamol   500 mg Oral QHS   pantoprazole   40 mg Oral QHS   polyethylene glycol  17 g Oral BID   potassium chloride   40 mEq Oral Once   senna-docusate  1 tablet Oral BID   sertraline   25 mg Oral Daily   simvastatin   20 mg Oral q1800   Continuous Infusions:   LOS: 8 days   Alejandro Marker, DO [facility] Available via Epic secure chat 7am-7pm After these hours, please refer to coverage provider listed on [email or web address] 10/15/2023, 4:59 PM

## 2023-10-15 NOTE — TOC Progression Note (Signed)
 Transition of Care [facility]) - Progression Note    Patient Details  Name: HAWKE VILLALPANDO MRN: [id number] Date of Birth: 07-08-1955  Transition of Care [facility] CM/SW Contact  Sonda Manuella Quill, RN Phone Number: 10/15/2023, 8:32 AM  Clinical Narrative:    Notified by Powell at [facility] that faxed documents have been received, and packet should be reviewed today; review may possibly be pushed until Monday depending on the number of packets in review; she was given main TOC # for contact; awaiting [facility] decision.    Expected Discharge Plan: Home w Home Health Services Barriers to Discharge: Continued Medical Work up               Expected Discharge Plan and Services In-house Referral: Clinical Social Work Discharge Planning Services: NA Post Acute Care Choice: Home Health Living arrangements for the past 2 months: Single Family Home                 DME Arranged: N/A DME Agency: NA       HH Arranged: NA HH Agency: Other - See comment Producer, television/film/video) Date HH Agency Contacted: 10/11/23 Time HH Agency Contacted: 1422 Representative spoke with at [facility] Agency: Jon   Social Drivers of Health (SDOH) Interventions SDOH Screenings   Food Insecurity: No Food Insecurity (10/08/2023)  Housing: Low Risk  (10/08/2023)  Transportation Needs: No Transportation Needs (10/08/2023)  Utilities: Not At Risk (10/08/2023)  Depression (PHQ2-9): Low Risk  (09/14/2023)  Social Connections: Unknown (10/08/2023)  Recent Concern: Social Connections - Moderately Isolated (10/08/2023)  Tobacco Use: Low Risk  (10/07/2023)    Readmission Risk Interventions    10/11/2023    2:19 PM 08/30/2023   11:40 AM 08/07/2023    8:49 AM  Readmission Risk Prevention Plan  Transportation Screening Complete Complete Complete  PCP or Specialist Appt within 5-7 Days   Complete  PCP or Specialist Appt within 3-5 Days  Complete   Home Care Screening   Complete  Medication Review (RN CM)   Complete  HRI or Home Care Consult   Complete   Social Work Consult for Recovery Care Planning/Counseling  Complete   Palliative Care Screening  Complete   Medication Review Oceanographer) Complete Complete   PCP or Specialist appointment within 3-5 days of discharge Complete    HRI or Home Care Consult Complete    SW Recovery Care/Counseling Consult Complete    Palliative Care Screening Not Applicable    Skilled Nursing Facility Not Applicable

## 2023-10-15 NOTE — Plan of Care (Signed)
  Problem: Metabolic: Goal: Ability to maintain appropriate glucose levels will improve Outcome: Progressing   Problem: Coping: Goal: Ability to adjust to condition or change in health will improve Outcome: Not Progressing   Problem: Fluid Volume: Goal: Ability to maintain a balanced intake and output will improve Outcome: Not Progressing   Problem: Health Behavior/Discharge Planning: Goal: Ability to manage health-related needs will improve Outcome: Not Progressing   Problem: Nutritional: Goal: Maintenance of adequate nutrition will improve Outcome: Not Progressing   Problem: Tissue Perfusion: Goal: Adequacy of tissue perfusion will improve Outcome: Not Progressing

## 2023-10-15 NOTE — Progress Notes (Signed)
 PT Cancellation Note  Patient Details Name: KEOLA HENINGER MRN: [id number] DOB: 04/10/1955   Cancelled Treatment:     Pt politely declined.  They gave me something and Pt has had multiple episodes of loose stools.   LPT has changed rec to SNF due to Pt's slow progress.   Rehab Team to continue to follow and attempt to see another day.   Katheryn Leap  PTA Acute  Rehabilitation Services Office M-F          [phone]

## 2023-10-15 NOTE — Consult Note (Signed)
 [facility] Psychiatric Consult Follow UP  Patient Name: .Tyler Scott  MRN: [id number]  DOB: 1955/09/14  Consult Order details:  Orders (From admission, onward)     Start     Ordered   10/13/23 1623  IP CONSULT TO PSYCHIATRY       Ordering Provider: Sherrill Alejandro Donovan, DO  Provider:  (Not yet assigned)  Question Answer Comment  Location [facility]   Reason for Consult? Depression      10/13/23 1622             Mode of Visit: In person    Psychiatry Consult Evaluation  Service Date: October 15, 2023 LOS:  LOS: 8 days  Chief Complaint 68 year old male with a long history of CLL who was admitted with a near syncopal episode.  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  Mild anxiety 3.  Chronic pain  Assessment  Tyler Scott is a 68 y.o. male admitted: Medicallyfor 10/07/2023  3:18 PM for syncopal episode. He carries the psychiatric diagnoses of depression and has a past medical history of CLL.   His current presentation of depression, anxiety and frustration with feelings of helplessness is most consistent with depressive disorder due to another medical condition.  He admits to some depression but denies any active suicidal or homicidal ideations or psychosis.  He is contracting for safety he does sometimes feel frustrated especially when he is alone and starts thinking about his chronic illness and begins to wonder when this will end. He feels dependent on others when is unable to do things for himself. Patient reports that he has not refused any treatment and that he has been continuing to take medications as prescribed.  He alternates between chemotherapy and medications and although he is somewhat tired about being on pills, he has not refused any treatments by self-report. He would benefit from outpatient counseling for coping skills and possibly an  SNRI if appropriate.  Depending on his disposition if patient ends up going to SNF, he may need additional support. please see plan below for detailed recommendations.   Diagnoses:  Active Hospital problems: Principal Problem:   Shock (HCC) Active Problems:   Vitamin K deficiency   Tumor lysis syndrome   Symptomatic anemia   Pressure injury of skin    Plan   ## Psychiatric Medication Recommendations:  Continue Zoloft  25 mg a day and titrate to 50 mg a day. Patient is on doxepin  25 mg at night and this can be continued with the Zoloft .  ## Medical Decision Making Capacity: Not specifically addressed in this encounter  ## Further Work-up:  -- As per the hospitalist EKG -- most recent EKG on 10/12/2023 had QtC of 445 -- Pertinent labwork reviewed earlier this admission includes: Per the hospitalist   ## Disposition:-- There are no psychiatric contraindications to discharge at this time.  At the time of discharge please consult TOC for possible appointment for outpatient counseling and medication management.  If patient is going to the SNF, appropriate referrals can be made for outpatient follow-up.  ## Behavioral / Environmental: -Utilize compassion and acknowledge the patient's experiences while setting clear and realistic expectations for care.    ## Safety and Observation Level:  - Based on my clinical evaluation, I estimate the patient to be at low risk of self harm in the current setting. - At this time, we recommend  routine. This  decision is based on my review of the chart including patient's history and current presentation, interview of the patient, mental status examination, and consideration of suicide risk including evaluating suicidal ideation, plan, intent, suicidal or self-harm behaviors, risk factors, and protective factors. This judgment is based on our ability to directly address suicide risk, implement suicide prevention strategies, and develop a safety plan while  the patient is in the clinical setting. Please contact our team if there is a concern that risk level has changed.  CSSR Risk Category:C-SSRS RISK CATEGORY: No Risk  Suicide Risk Assessment: Patient has following modifiable risk factors for suicide: untreated depression and social isolation, which we are addressing by medication and treatment. Patient has following non-modifiable or demographic risk factors for suicide: male gender Patient has the following protective factors against suicide: Supportive family, Cultural, spiritual, or religious beliefs that discourage suicide, and no history of suicide attempts  Thank you for this consult request. Recommendations have been communicated to the primary team.  We will sign off at this time.  Sharlot Becker, NP       History of Present Illness  Relevant Aspects of [facility] Hospital Course:  Admitted on 10/07/2023 for near syncopal episode and CLL.  He is being seen by the hospitalist and the oncologist.  Patient Report:  The patient is a 68 year old male who was alert oriented and cooperative.  He maintained fair to good eye contact.  He was fairly forthcoming in his information.  He reports that he has had no prior history of major depression and that he has been dealing with his chronic illness that has been quite frustrating to him.  He states that he lives with his wife and has a Museum/gallery conservator at home.  He has been having chemotherapy for his CLL for the last 7 to 8 years.  He is on medical disability and spends his time watching science shows on TV.  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 Today endorses depression active 3-4 out of 10.  He denies any active SI/HI/AVH.  He is contracting for safety.  He has been reporting no concerns other than having to deal with his chronic condition and wondering how much longer is going to live with this.  He does acknowledge  that when he is alone he is a little bit more depressed than when he is with company.  His wife was apparently in an motor vehicle accident and has chronic problems of her all but she is on close the primary caregiver for him.  This concerns him.  He may be willing to go to the SNF once that decision is made.  10/15/2023: The client denies depression today, I'm feeling good, no depression.  No suicidal ideations.  He has periods of anxiety late at night, Anxiety knocks on your door, doesn't last long, watch tv, old shows and his symptoms dissipate.  He also reported he is eating and sleeping without issues besides being awakened at 5 am for blood draws.  His mood was upbeat and said, The drugs are doing their job. Denies side effects from his medications. No other concerns, psych will sign off.  Psych ROS:  Depression: No prior psychiatric history other than some counseling in the past.  No antidepressants tried. Anxiety: Occasional anxiety. Mania (lifetime and current): Denies any Psychosis: (lifetime and current): Denies  Collateral information:  Contacted Antoney Biven, his wife at [phone]  on 10/14/2023.  Patient's wife is a good historian and reports that he generally does fairly well but she can tell that he is getting a little depressed.  She has no concerns about him being suicidal or having any unusual thoughts.  Although she is a primary provider for him, she does have significant physical problems of her own and apparently the team is talking about possible referral to a SNF.   Review of Systems  Psychiatric/Behavioral:  The patient is nervous/anxious.      Psychiatric and Social History  Psychiatric History:  Information collected from medical records, patient and patient's wife.  Prev Dx/Sx: None noted Current Psych Provider: None Home Meds (current): None Previous Med Trials: None Therapy: Some brief counseling in the past  Prior Psych Hospitalization: None Prior  Self Harm: None Prior Violence: None  Family Psych History: Unknown Family Hx suicide: None  Social History:  Patient reports that he was married twice the first marriage lasted for 7 years and he has been in his current marriage for about 24 years.  His wife has 2 children a boy and a girl.  He has no children of his own.  He was in [employer] and worked full-time for several years before his chronic lymphocytic leukemia.  He is currently on medical disability. Access to weapons/lethal means: Denies  Substance History Patient reports that he was a social drinker in the past.  Currently not drinking at all.  He denies any alcohol  or substance use.  Exam Findings  Physical Exam: As per the hospitalist Vital Signs:  Temp:  [97.5 F (36.4 C)-98.2 F (36.8 C)] 98 F (36.7 C) (09/05 0423) Pulse Rate:  [90-95] 95 (09/05 0423) Resp:  [18] 18 (09/05 0423) BP: (126-136)/(63-65) 133/63 (09/05 0423) SpO2:  [96 %-99 %] 96 % (09/05 0423) Weight:  [58.1 kg] 58.1 kg (09/05 0436) Blood pressure 133/63, pulse 95, temperature 98 F (36.7 C), temperature source Oral, resp. rate 18, height 5' 8 (1.727 m), weight 58.1 kg, SpO2 96%. Body mass index is 19.48 kg/m.  Physical Exam Neurological:     General: No focal deficit present.     Mental Status: He is oriented to person, place, and time. Mental status is at baseline.     Mental Status Exam: General Appearance: Casual  Orientation:  Full (Time, Place, and Person)  Memory:  Immediate;   Good Recent;   Good Remote;   Good  Concentration:  Good  Recall:  Good  Attention  Good  Eye Contact:  Good  Speech:  WDL  Language:  Good  Volume:  WDL  Mood: I feel good  Affect:  Appropriate  Thought Process:  Coherent  Thought Content:  Logical  Suicidal Thoughts:  No  Homicidal Thoughts:  No  Judgement:  Fair  Insight:  Fair  Psychomotor Activity:  Normal  Akathisia:  No  Fund of Knowledge:  Good      Assets:  Communication  Skills Desire for Improvement Financial Resources/Insurance Housing  Cognition:  WNL  ADL's:  Intact  AIMS (if indicated):        Other History   These have been pulled in through the EMR, reviewed, and updated if appropriate.  Family History:  The patient's family history includes Alcohol  abuse in an other family member; Colon cancer (age of onset: 77) in his brother; Diabetes Mellitus II in his mother.  Medical History: Past Medical History:  Diagnosis Date   Allergy    Arthritis  CLL (chronic lymphocytic leukemia) (HCC)    stage III: asymptomatic; on observation   DIABETES MELLITUS, TYPE I, UNCONTROLLED 12/30/2006   insulin    Diabetic retinopathy    ERECTILE DYSFUNCTION, ORGANIC 12/30/2006   Hx of adenomatous polyp of colon 05/24/2014   HYPERLIPIDEMIA 12/30/2006   HYPERTENSION 12/30/2006   LOW BACK PAIN, ACUTE 12/21/2006    Surgical History: Past Surgical History:  Procedure Laterality Date   COLONOSCOPY     great right toe- Fused the I P joint       Medications:   Current Facility-Administered Medications:    0.9 %  sodium chloride  infusion (Manually program via Guardrails IV Fluids), , Intravenous, Once, Plunkett, Whitney, MD   acyclovir  (ZOVIRAX ) tablet 400 mg, 400 mg, Oral, Daily, [place], Eric J, DO, 400 mg at 10/15/23 0930   allopurinol  (ZYLOPRIM ) tablet 150 mg, 150 mg, Oral, Daily, [place], Camellia PARAS, DO, 150 mg at 10/15/23 0930   artificial tears ophthalmic solution 1 drop, 1 drop, Both Eyes, PRN, Olalere, Adewale A, MD, 1 drop at 10/14/23 2132   Chlorhexidine  Gluconate Cloth 2 % PADS 6 each, 6 each, Topical, Daily, Ilah Krabbe M, PA-C, 6 each at 10/14/23 1023   doxepin  (SINEQUAN ) capsule 25 mg, 25 mg, Oral, QHS, [place], Eric J, DO, 25 mg at 10/14/23 2130   feeding supplement (ENSURE PLUS HIGH PROTEIN) liquid 237 mL, 237 mL, Oral, BID BM, [place], Eric J, DO, 237 mL at 10/14/23 1023   gabapentin  (NEURONTIN ) capsule 100 mg, 100 mg, Oral, TID, [place],  Camellia PARAS, DO, 100 mg at 10/15/23 0932   guaiFENesin -dextromethorphan  (ROBITUSSIN DM) 100-10 MG/5ML syrup 5 mL, 5 mL, Oral, Q4H PRN, Ogan, Okoronkwo U, MD, 5 mL at 10/09/23 0432   HYDROmorphone  (DILAUDID ) tablet 2 mg, 2 mg, Oral, QHS, [place], Eric J, DO, 2 mg at 10/14/23 2128   insulin  aspart (novoLOG ) injection 0-9 Units, 0-9 Units, Subcutaneous, Q4H, Reese, Stephanie M, PA-C, 1 Units at 10/15/23 0020   lip balm (CARMEX) ointment, , Topical, PRN, Dewald, Jonathan B, MD, Given at 10/08/23 2237   methocarbamol  (ROBAXIN ) tablet 500 mg, 500 mg, Oral, QHS, [place], Camellia PARAS, DO, 500 mg at 10/14/23 2129   morphine  (PF) 2 MG/ML injection 2 mg, 2 mg, Intravenous, Q3H PRN, [place], Camellia PARAS, DO, 2 mg at 10/10/23 1319   ondansetron  (ZOFRAN ) injection 4 mg, 4 mg, Intravenous, Q6H PRN, Ilah, Stephanie M, PA-C   pantoprazole  (PROTONIX ) EC tablet 40 mg, 40 mg, Oral, QHS, Sheikh, Omair Latif, DO, 40 mg at 10/14/23 2129   polyethylene glycol (MIRALAX  / GLYCOLAX ) packet 17 g, 17 g, Oral, BID, Sherrill Cable Latif, DO, 17 g at 10/14/23 2128   senna-docusate (Senokot-S) tablet 1 tablet, 1 tablet, Oral, BID, Sherrill Cable Latif, DO, 1 tablet at 10/15/23 9068   sertraline  (ZOLOFT ) tablet 25 mg, 25 mg, Oral, Daily, Goli, Veeraindar, MD, 25 mg at 10/15/23 0931   simvastatin  (ZOCOR ) tablet 20 mg, 20 mg, Oral, q1800, [place], Camellia PARAS, DO, 20 mg at 10/14/23 1755   traMADol  (ULTRAM ) tablet 50 mg, 50 mg, Oral, Q6H PRN, [place], Camellia PARAS, DO, 50 mg at 10/14/23 1330  Allergies: No Known Allergies  Sharlot Becker, NP

## 2023-10-15 NOTE — Plan of Care (Signed)
  Problem: Coping: Goal: Ability to adjust to condition or change in health will improve Outcome: Progressing   Problem: Nutritional: Goal: Maintenance of adequate nutrition will improve Outcome: Progressing   Problem: Skin Integrity: Goal: Risk for impaired skin integrity will decrease Outcome: Progressing   Problem: Coping: Goal: Level of anxiety will decrease Outcome: Progressing

## 2023-10-16 DIAGNOSIS — E883 Tumor lysis syndrome: Secondary | ICD-10-CM | POA: Diagnosis not present

## 2023-10-16 DIAGNOSIS — D649 Anemia, unspecified: Secondary | ICD-10-CM | POA: Diagnosis not present

## 2023-10-16 DIAGNOSIS — C9110 Chronic lymphocytic leukemia of B-cell type not having achieved remission: Secondary | ICD-10-CM | POA: Diagnosis not present

## 2023-10-16 DIAGNOSIS — D693 Immune thrombocytopenic purpura: Secondary | ICD-10-CM | POA: Diagnosis not present

## 2023-10-16 LAB — COMPREHENSIVE METABOLIC PANEL WITH GFR
ALT: 12 U/L (ref 0–44)
AST: 25 U/L (ref 15–41)
Albumin: 3.2 g/dL — ABNORMAL LOW (ref 3.5–5.0)
Alkaline Phosphatase: 89 U/L (ref 38–126)
Anion gap: 11 (ref 5–15)
BUN: 11 mg/dL (ref 8–23)
CO2: 24 mmol/L (ref 22–32)
Calcium: 8 mg/dL — ABNORMAL LOW (ref 8.9–10.3)
Chloride: 105 mmol/L (ref 98–111)
Creatinine, Ser: 0.8 mg/dL (ref 0.61–1.24)
GFR, Estimated: >60 mL/min (ref >60)
Glucose, Bld: 153 mg/dL — ABNORMAL HIGH (ref 70–99)
Potassium: 4.1 mmol/L (ref 3.5–5.1)
Sodium: 140 mmol/L (ref 135–145)
Total Bilirubin: 1.1 mg/dL (ref 0.0–1.2)
Total Protein: 4.6 g/dL — ABNORMAL LOW (ref 6.5–8.1)

## 2023-10-16 LAB — CBC WITH DIFFERENTIAL/PLATELET
Abs Immature Granulocytes: 0.04 K/uL (ref 0.00–0.07)
Basophils Absolute: 0 K/uL (ref 0.0–0.1)
Basophils Relative: 1 %
Eosinophils Absolute: 0.1 K/uL (ref 0.0–0.5)
Eosinophils Relative: 1 %
HCT: 26.9 % — ABNORMAL LOW (ref 39.0–52.0)
Hemoglobin: 8.4 g/dL — ABNORMAL LOW (ref 13.0–17.0)
Immature Granulocytes: 1 %
Lymphocytes Relative: 70 %
Lymphs Abs: 4.1 K/uL — ABNORMAL HIGH (ref 0.7–4.0)
MCH: 29.1 pg (ref 26.0–34.0)
MCHC: 31.2 g/dL (ref 30.0–36.0)
MCV: 93.1 fL (ref 80.0–100.0)
Monocytes Absolute: 0.5 K/uL (ref 0.1–1.0)
Monocytes Relative: 9 %
Neutro Abs: 1 K/uL — ABNORMAL LOW (ref 1.7–7.7)
Neutrophils Relative %: 18 %
Platelets: 21 K/uL — CL (ref 150–400)
RBC: 2.89 MIL/uL — ABNORMAL LOW (ref 4.22–5.81)
RDW: 17.3 % — ABNORMAL HIGH (ref 11.5–15.5)
Smear Review: NORMAL
WBC: 5.7 K/uL (ref 4.0–10.5)
nRBC: 0.3 % — ABNORMAL HIGH (ref 0.0–0.2)

## 2023-10-16 LAB — GLUCOSE, CAPILLARY
Glucose-Capillary: 104 mg/dL — ABNORMAL HIGH (ref 70–99)
Glucose-Capillary: 114 mg/dL — ABNORMAL HIGH (ref 70–99)
Glucose-Capillary: 115 mg/dL — ABNORMAL HIGH (ref 70–99)
Glucose-Capillary: 134 mg/dL — ABNORMAL HIGH (ref 70–99)
Glucose-Capillary: 92 mg/dL (ref 70–99)
Glucose-Capillary: 99 mg/dL (ref 70–99)

## 2023-10-16 LAB — MAGNESIUM: Magnesium: 1.8 mg/dL (ref 1.7–2.4)

## 2023-10-16 LAB — PHOSPHORUS: Phosphorus: 3.2 mg/dL (ref 2.5–4.6)

## 2023-10-16 NOTE — Progress Notes (Signed)
 PROGRESS NOTE    Tyler Scott  FMW:[id number] DOB: March 13, 1955 DOA: 10/07/2023 PCP: [facility]   Brief Narrative:  Tyler Scott is a 68 y.o. male with past medical history significant for CLL with recent initiation of chemotherapy, HTN, HLD, DM 1, chronic ITP (baseline Plt 40's) who presented to [facility] ED from the cancer center with complaints of abdominal pain, nausea, poor oral intake, generalized weakness with near syncope and shortness of breath.  Patient was due for next chemotherapy infusion today, 8/28 and when attempting to get to the car had an episode of near syncope with associated shortness of breath and generalized weakness.  EMS was activated in which she was noted to be tachycardic, hypotensive with mild AMS (improved shortly after arrival).  Patient's wife also noted hyperglycemia to the 300s the morning of 10/07/2023 in which she administered insulin .   On ED arrival, patient was afebrile with HR 116, BP 95/67, RR 25, SpO2 94%. Labs were notable for WBC 14.6 (down from 17.1 8/27 with ANC 1100), Hgb 1.7 (8.2), Plt 10 (47). INR > 10 (1 two months PTA). CMP/lipase pending. Trop 46. LA 2.5 > 5.4. VBG 7.21/pCO2 < 18/pO2 < 31/bicarb 5.6. Initial CBG 40. CXR demonstrated small L pleural effusion and L lung base atelectasis with R paratracheal adenoapthyp; CT A/P showed extensive bulky LAD throughout the lower chest, abdomen, pelvis and groin regions (similar from prior), splenic enlargement with peripheral splenic infarct, small lucent lesions in the pelvis c/f lymphomatous involvement, moderate L and small R pleural effusions with atelectasis. Broad-spectrum antibiotics were started, Cx pending. Levophed  started for hypotension. Oncology consulted. PCCM consulted for ICU admission.   Significant hospital events: 8/28: Admit ICU, started on vasopressors, oncology consulted; transfused 2 unit PRBC 8/29: Levophed  titrated off; received rasburicase , 1 unit platelets, 1 unit  PRBC transfusion 8/30: 1 unit platelet transfusion 8/31: Transferred to [facility]; 2 unit platelet transfusion 9/1: 1u platelet transfusion, transfer to 6 E. 9/2: transfuse 1u Plt, to receive NPLATE  per oncology  9/3: SNF Selected and [facility] on [facility] 9/5: Medically stable to D/C to SNF but awaiting Insurance Auth and [place] to make a decision and it will be done hopefully 10/18/23  Assessment and Plan:  Shock, undifferentiated Concern for sepsis, ruled out Patient presenting with weakness and noted to be hypotensive, initially admitted to the intensive care unit on vasopressors.  Suspect etiology likely secondary to severe dehydration, poor oral intake with recent chemotherapy infusion as well as tumor lysis syndrome as above.  Initially was started on empiric antibiotics with vancomycin , cefepime  and doxycycline  which were stopped. WBC stable at 5.7   Acute hypoxemic respiratory failure: Resolved; Supplemental oxygen weaned off.   CLL/SLL/low-grade lymphoma Tumor lysis syndrome Patient initially diagnosed with CLL 2012. Most recently patient received Cycle 1 Bendamustine  (without Rituxan ) on 09/08/23.  Now he is status post cycle 2 which was started on 10/06/2023.  Imaging 8/29 with extensive bulky lymphadenopathy throughout the lower chest, abdomen, pelvis and groin similar to previous study also with splenic enlargement, noted bone lesions progressive since prior study.  Uric acid 12.3, received rasburicase  on 8/28. -Uric acid 12.3>5.1..<1.5 on last check -Further per oncology and the plan is for Venetoclax  + Rituxan  the patient's platelet counts are better.  Will continue physical therapy efforts and dietary efforts while he is hospitalized and PT/OT recommending SNF.   Acute renal failure: Resolved; Suspect etiology multifactorial with ATN from hypotension/shock as well as prerenal azotemia  from severe dehydration.  No hydronephrosis noted on CT abdomen/pelvis at  time of admission.  IV fluids now discontinued and he was given IV Lasix  20 mg x1 due to pedal edema and he is +4.8785 Liters since admit. BUN/Cr Trend stable at 11/0.80 on last check. Avoid Nephrotoxic Medications, Contrast Dyes, Hypotension and Dehydration to Ensure Adequate Renal Perfusion and will need to Renally Adjust Meds. CTM and Trend Renal Function carefully and repeat CMP in the AM    Normocytic Anemia, severe: Hemoglobin on arrival 1.7; transfused 3 unit PRBCs. Hgb/Hct Trend: Recent Labs  Lab 10/10/23 1447 10/11/23 0330 10/12/23 0535 10/13/23 0554 10/14/23 0532 10/15/23 0526 10/16/23 0744  HGB 10.4* 8.7* 9.8* 8.5* 8.7* 8.3* 8.4*  HCT 33.4* 26.7* 30.6* 27.6* 27.6* 26.6* 26.9*  MCV 93.3 93.0 91.3 93.6 93.6 92.4 93.1  -Transfuse for hemoglobin less than 7.0. CTM for S/Sx of Bleeding; No overt bleeding noted. Repeat CBC in the AM   Thrombocytopenia, severe Platelet count 10K arrival.  Received 1 unit platelet on 8/29, 8/30, 2 u on 8/31, 1u 9/1 and 1u on 9/2 -Plt 10>15>18>>14>18>15>27>18 -> 21 x4 -NPLATE  ordered by oncology 10/12/23 and recommending 4 mcg/kg weekly -Transfuse for counts <20K or <50K with bleeding  -CBC with diff daily; for further discussion with oncology they feel that he is okay from their point of view to discharge the patient given that his platelet count has been above 20,000 x 3 and he will be set up with weekly Nplate  and PRBC transfusion support with labs weekly starting on 10/19/2023.  He is supposed to start Venetoclax  in 2 weeks -CTM closely for bleeding   Hypokalemia: K+ is now 4.1. CTM and Replete as Necessary. Repeat CMP in the AM  Hypophosphatemia: Phos Level is now 3.2. CTM and Replete as Necessary. Repeat Phos Level in the AM  Hypomagnesemia: Mag Level is now 1.8. Replete w/ IV Mag Sulfate 2 grams x1. CTM and Replete as Necessary. Repeat Mag Level in the AM  Constipation -> Diarrhea: Constipation Improved s/p Bisacodyl  Suppository 10 mg x1 and now  having diarrhea and abdomnal discomfort; Had Changed Docusate 100 mg po BID PRN mild Constipation and Miralax  17 gras po Dailyprn to Scheduled Senna-Docusate 1 tab po BID and Miralax  17 BID but will stop all Laxatives now   Essential HTN: Patient was notably hypotensive/shock as above at time of arrival but this is improved. CTM BP per protocol. Last BP reading was 148/77  HLD:  C/w Simvastatin  20 mg p.o. daily   Type 1 diabetes mellitus: On Jardiance  12.5 mg p.o. daily, Lantus  10-30 units [place] Prn at bedtime at baseline. C/w Sensitive SSI for coverage. CTM CBGs per Protocol. CBG trending from 91-140 the last 7 checks.   Chronic pain: Patient reportedly on MS Contin  15 mg p.o. nightly, gabapentin  100 mg p.o. 3 times daily Dilaudid  2 mg p.o. nightly Robaxin  500 mg p.o. nightly -Hold home MS Contin  -Continue Gabapentin  100 mg PO TID -Continue Hydromorphone  2 mg PO at bedtime -Continue Robaxin  500 mg PO at bedtime  Depression and Anxiety: Psychiatry consulted for further evaluation they are recommending sertraline  25 mg p.o. daily and then titrating to 50 mg a day eventually.  Patient remains on Doxepin  25 mg nightly   Weakness/Debility/Deconditioning/Gait Disturbance: -PT currently recommending SNF now to address mobility and functional Decline  -Spouse reports unable to care for patient currently at home; [facility] consulted and SNF being pursued.    Hypoalbuminemia: Patient's Albumin Lvl has gone from 3.4 ->  3.1 -> 2.9 x2 -> 3.0 -> 3.1 -> 3.2. CTM and Trend and repeat CMP in the AM   DVT Prophylaxis: SCDs Start: 10/07/23 1835    Code Status: Full Code Family Communication: D/w wife @ bedside   Disposition Plan:  Level of care: Telemetry Status is: Inpatient Remains inpatient appropriate because: Appears fairly stable from a medical standpoint now that Plt Count stabilized and [facility] Author[facility]   Consultants:  Medical Oncology PCCM/Pulmonary Psychiatry   Procedures:  As  delineated as above  Antimicrobials:  Anti-infectives (From admission, onward)    Start     Dose/Rate Route Frequency Ordered Stop   10/10/23 1000  acyclovir  (ZOVIRAX ) tablet 400 mg        400 mg Oral Daily 10/10/23 0823     10/07/23 2000  doxycycline  (VIBRAMYCIN ) 100 mg in sodium chloride  0.9 % 250 mL IVPB  Status:  Discontinued        100 mg 125 mL/hr over 120 Minutes Intravenous Every 12 hours 10/07/23 1849 10/09/23 1140   10/07/23 1700  vancomycin  (VANCOREADY) IVPB 1250 mg/250 mL        1,250 mg 166.7 mL/hr over 90 Minutes Intravenous  Once 10/07/23 1625 10/07/23 1919   10/07/23 1630  ceFEPIme  (MAXIPIME ) 2 g in sodium chloride  0.9 % 100 mL IVPB        2 g 200 mL/hr over 30 Minutes Intravenous  Once 10/07/23 1625 10/07/23 1728       Subjective: Seen and examined at bedside states that he is not feeling well today and stated that he is now having some liquidy stools and diarrhea.  States that he was constipated earlier but now having 2 episodes of diarrhea and states that is causing his stomach to cry.  No nausea or vomiting.  Happy that his pedal edema has improved.  No other concerns at this time and no evidence of bleeding noted.  Objective: Vitals:   10/15/23 1945 10/16/23 0353 10/16/23 0709 10/16/23 1345  BP: (!) 141/69 (!) 142/68  (!) 148/77  Pulse: 100 97  95  Resp: 18 18  18   Temp: 98.7 F (37.1 C) (!) 97.3 F (36.3 C)  98.8 F (37.1 C)  TempSrc: Oral Oral  Oral  SpO2: 99% 95%  98%  Weight:   58.9 kg   Height:        Intake/Output Summary (Last 24 hours) at 10/16/2023 1814 Last data filed at 10/16/2023 0015 Gross per 24 hour  Intake --  Output 350 ml  Net -350 ml   Filed Weights   10/12/23 0706 10/15/23 0436 10/16/23 0709  Weight: 62.2 kg 58.1 kg 58.9 kg   Examination: Physical Exam:  Constitutional: Thin African-American male in no acute distress appears a little uncomfortable though Respiratory: Diminished to auscultation bilaterally, no wheezing, rales,  rhonchi or crackles. Normal respiratory effort and patient is not tachypenic. No accessory muscle use.  Unlabored breathing minimal Cardiovascular: RRR, no murmurs / rubs / gallops. S1 and S2 auscultated.  Minimal pedal edema Abdomen: Soft, slightly-tender, non-distended. Bowel sounds positive.  GU: Deferred. Musculoskeletal: No clubbing / cyanosis of digits/nails. No joint deformity upper and lower extremities.  Skin: No rashes, lesions, ulcers on limited skin evaluation. No induration; Warm and dry.  Neurologic: CN 2-12 grossly intact with no focal deficits. Romberg sign and cerebellar reflexes not assessed.  Psychiatric: Normal judgment and insight. Alert and oriented x 3.   Data Reviewed: I have personally reviewed following labs and imaging studies  CBC: Recent Labs  Lab 10/12/23 0535 10/13/23 0554 10/14/23 0532 10/15/23 0526 10/16/23 0744  WBC 9.1 6.7 7.0 6.1 5.7  NEUTROABS 0.9* 0.8* 1.2* 0.9* 1.0*  HGB 9.8* 8.5* 8.7* 8.3* 8.4*  HCT 30.6* 27.6* 27.6* 26.6* 26.9*  MCV 91.3 93.6 93.6 92.4 93.1  PLT 18* 21* 21* 21* 21*   Basic Metabolic Panel: Recent Labs  Lab 10/12/23 0535 10/13/23 0554 10/14/23 0532 10/15/23 0526 10/16/23 0744  NA 141 141 138 140 140  K 3.9 3.7 3.8 3.5 4.1  CL 107 106 104 105 105  CO2 24 25 24 25 24   GLUCOSE 102* 77 81 89 153*  BUN 10 9 8 10 11   CREATININE 0.78 0.66 0.68 0.74 0.80  CALCIUM  7.7* 7.6* 7.8* 8.1* 8.0*  MG 1.5* 1.4* 2.1 1.9 1.8  PHOS  --   --  2.2* 3.0 3.2   GFR: Estimated Creatinine Clearance: 74.6 mL/min (by C-G formula based on SCr of 0.8 mg/dL). Liver Function Tests: Recent Labs  Lab 10/10/23 0345 10/14/23 0532 10/15/23 0526 10/16/23 0744  AST 21 20 21 25   ALT 20 9 10 12   ALKPHOS 109 88 87 89  BILITOT 1.2 1.1 1.1 1.1  PROT 4.5* 4.6* 4.5* 4.6*  ALBUMIN 2.9* 3.0* 3.1* 3.2*   No results for input(s): LIPASE, AMYLASE in the last 168 hours. No results for input(s): AMMONIA in the last 168 hours. Coagulation  Profile: No results for input(s): INR, PROTIME in the last 168 hours. Cardiac Enzymes: No results for input(s): CKTOTAL, CKMB, CKMBINDEX, TROPONINI in the last 168 hours. BNP (last 3 results) No results for input(s): PROBNP in the last 8760 hours. HbA1C: No results for input(s): HGBA1C in the last 72 hours. CBG: Recent Labs  Lab 10/16/23 0014 10/16/23 0357 10/16/23 0700 10/16/23 1131 10/16/23 1622  GLUCAP 104* 92 134* 99 114*   Lipid Profile: No results for input(s): CHOL, HDL, LDLCALC, TRIG, CHOLHDL, LDLDIRECT in the last 72 hours. Thyroid  Function Tests: No results for input(s): TSH, T4TOTAL, FREET4, T3FREE, THYROIDAB in the last 72 hours. Anemia Panel: No results for input(s): VITAMINB12, FOLATE, FERRITIN, TIBC, IRON, RETICCTPCT in the last 72 hours. Sepsis Labs: No results for input(s): PROCALCITON, LATICACIDVEN in the last 168 hours.  Recent Results (from the past 240 hours)  Blood Culture (routine x 2)     Status: Abnormal   Collection Time: 10/07/23  3:39 PM   Specimen: BLOOD RIGHT ARM  Result Value Ref Range Status   Specimen Description   Final    BLOOD RIGHT ARM Performed at [facility], [address] [place], [place] [address]    Special Requests   Final    BOTTLES DRAWN AEROBIC ONLY Blood Culture results may not be optimal due to an inadequate volume of blood received in culture bottles Performed at [facility], [address] [place], [place] [address]    Culture  Setup Time   Final    GRAM POSITIVE COCCI IN CLUSTERS AEROBIC BOTTLE ONLY CRITICAL RESULT CALLED TO, READ BACK BY AND VERIFIED WITH: PHARMD CHRISTINE SHADE ON 10/08/23 @ 1501 BY DRT    Culture (A)  Final    STAPHYLOCOCCUS HOMINIS THE SIGNIFICANCE OF ISOLATING THIS ORGANISM FROM A SINGLE SET OF BLOOD CULTURES WHEN MULTIPLE SETS ARE DRAWN IS UNCERTAIN. PLEASE NOTIFY THE MICROBIOLOGY DEPARTMENT WITHIN ONE WEEK IF  SPECIATION AND SENSITIVITIES ARE REQUIRED. Performed at [facility], [address] [place], [place] [address]    Report Status  10/10/2023 FINAL  Final  Blood Culture ID Panel (Reflexed)     Status: Abnormal   Collection Time: 10/07/23  3:39 PM  Result Value Ref Range Status   Enterococcus faecalis NOT DETECTED NOT DETECTED Final   Enterococcus Faecium NOT DETECTED NOT DETECTED Final   Listeria monocytogenes NOT DETECTED NOT DETECTED Final   Staphylococcus species DETECTED (A) NOT DETECTED Final    Comment: CRITICAL RESULT CALLED TO, READ BACK BY AND VERIFIED WITH: PHARMD CHRISTINE SHADE ON 10/08/23 @ 1501 BY DRT    Staphylococcus aureus (BCID) NOT DETECTED NOT DETECTED Final   Staphylococcus epidermidis NOT DETECTED NOT DETECTED Final   Staphylococcus lugdunensis NOT DETECTED NOT DETECTED Final   Streptococcus species NOT DETECTED NOT DETECTED Final   Streptococcus agalactiae NOT DETECTED NOT DETECTED Final   Streptococcus pneumoniae NOT DETECTED NOT DETECTED Final   Streptococcus pyogenes NOT DETECTED NOT DETECTED Final   A.calcoaceticus-baumannii NOT DETECTED NOT DETECTED Final   Bacteroides fragilis NOT DETECTED NOT DETECTED Final   Enterobacterales NOT DETECTED NOT DETECTED Final   Enterobacter cloacae complex NOT DETECTED NOT DETECTED Final   Escherichia coli NOT DETECTED NOT DETECTED Final   Klebsiella aerogenes NOT DETECTED NOT DETECTED Final   Klebsiella oxytoca NOT DETECTED NOT DETECTED Final   Klebsiella pneumoniae NOT DETECTED NOT DETECTED Final   Proteus species NOT DETECTED NOT DETECTED Final   Salmonella species NOT DETECTED NOT DETECTED Final   Serratia marcescens NOT DETECTED NOT DETECTED Final   Haemophilus influenzae NOT DETECTED NOT DETECTED Final   Neisseria meningitidis NOT DETECTED NOT DETECTED Final   Pseudomonas aeruginosa NOT DETECTED NOT DETECTED Final   Stenotrophomonas maltophilia NOT DETECTED NOT DETECTED Final   Candida albicans NOT DETECTED  NOT DETECTED Final   Candida auris NOT DETECTED NOT DETECTED Final   Candida glabrata NOT DETECTED NOT DETECTED Final   Candida krusei NOT DETECTED NOT DETECTED Final   Candida parapsilosis NOT DETECTED NOT DETECTED Final   Candida tropicalis NOT DETECTED NOT DETECTED Final   Cryptococcus neoformans/gattii NOT DETECTED NOT DETECTED Final    Comment: Performed at [facility], [address], [place], [place] [address]  Respiratory (~20 pathogens) panel by PCR     Status: None   Collection Time: 10/07/23  7:47 PM   Specimen: Nasopharyngeal Swab; Respiratory  Result Value Ref Range Status   Adenovirus NOT DETECTED NOT DETECTED Final   Coronavirus 229E NOT DETECTED NOT DETECTED Final    Comment: (NOTE) The Coronavirus on the Respiratory Panel, DOES NOT test for the novel  Coronavirus (2019 nCoV)    Coronavirus HKU1 NOT DETECTED NOT DETECTED Final   Coronavirus NL63 NOT DETECTED NOT DETECTED Final   Coronavirus OC43 NOT DETECTED NOT DETECTED Final   Metapneumovirus NOT DETECTED NOT DETECTED Final   Rhinovirus / Enterovirus NOT DETECTED NOT DETECTED Final   Influenza A NOT DETECTED NOT DETECTED Final   Influenza B NOT DETECTED NOT DETECTED Final   Parainfluenza Virus 1 NOT DETECTED NOT DETECTED Final   Parainfluenza Virus 2 NOT DETECTED NOT DETECTED Final   Parainfluenza Virus 3 NOT DETECTED NOT DETECTED Final   Parainfluenza Virus 4 NOT DETECTED NOT DETECTED Final   Respiratory Syncytial Virus NOT DETECTED NOT DETECTED Final   Bordetella pertussis NOT DETECTED NOT DETECTED Final   Bordetella Parapertussis NOT DETECTED NOT DETECTED Final   Chlamydophila pneumoniae NOT DETECTED NOT DETECTED Final   Mycoplasma pneumoniae NOT DETECTED NOT DETECTED Final    Comment: Performed at [facility],  [address], [place], [place] [address]  Resp panel by RT-PCR (RSV, Flu A&B, Covid) Anterior Nasal Swab     Status: None   Collection Time: 10/07/23  7:47 PM   Specimen: Anterior Nasal  Swab  Result Value Ref Range Status   SARS Coronavirus 2 by RT PCR NEGATIVE NEGATIVE Final    Comment: (NOTE) SARS-CoV-2 target nucleic acids are NOT DETECTED.  The SARS-CoV-2 RNA is generally detectable in upper respiratory specimens during the acute phase of infection. The lowest concentration of SARS-CoV-2 viral copies this assay can detect is 138 copies/mL. A negative result does not preclude SARS-Cov-2 infection and should not be used as the sole basis for treatment or other patient management decisions. A negative result may occur with  improper specimen collection/handling, submission of specimen other than nasopharyngeal swab, presence of viral mutation(s) within the areas targeted by this assay, and inadequate number of viral copies(<138 copies/mL). A negative result must be combined with clinical observations, patient history, and epidemiological information. The expected result is Negative.  Fact Sheet for Patients:  BloggerCourse.com  Fact Sheet for Healthcare Providers:  SeriousBroker.it  This test is no t yet approved or cleared by the [place]  FDA and  has been authorized for detection and/or diagnosis of SARS-CoV-2 by FDA under an Emergency Use Authorization (EUA). This EUA will remain  in effect (meaning this test can be used) for the duration of the COVID-19 declaration under Section 564(b)(1) of the Act, 21 U.S.C.section 360bbb-3(b)(1), unless the authorization is terminated  or revoked sooner.       Influenza A by PCR NEGATIVE NEGATIVE Final   Influenza B by PCR NEGATIVE NEGATIVE Final    Comment: (NOTE) The Xpert Xpress SARS-CoV-2/FLU/RSV plus assay is intended as an aid in the diagnosis of influenza from Nasopharyngeal swab specimens and should not be used as a sole basis for treatment. Nasal washings and aspirates are unacceptable for Xpert Xpress SARS-CoV-2/FLU/RSV testing.  Fact Sheet for  Patients: BloggerCourse.com  Fact Sheet for Healthcare Providers: SeriousBroker.it  This test is not yet approved or cleared by the [place]  FDA and has been authorized for detection and/or diagnosis of SARS-CoV-2 by FDA under an Emergency Use Authorization (EUA). This EUA will remain in effect (meaning this test can be used) for the duration of the COVID-19 declaration under Section 564(b)(1) of the Act, 21 U.S.C. section 360bbb-3(b)(1), unless the authorization is terminated or revoked.     Resp Syncytial Virus by PCR NEGATIVE NEGATIVE Final    Comment: (NOTE) Fact Sheet for Patients: BloggerCourse.com  Fact Sheet for Healthcare Providers: SeriousBroker.it  This test is not yet approved or cleared by the [place]  FDA and has been authorized for detection and/or diagnosis of SARS-CoV-2 by FDA under an Emergency Use Authorization (EUA). This EUA will remain in effect (meaning this test can be used) for the duration of the COVID-19 declaration under Section 564(b)(1) of the Act, 21 U.S.C. section 360bbb-3(b)(1), unless the authorization is terminated or revoked.  Performed at [facility], [address], [place], [place] [address]   Blood Culture (routine x 2)     Status: None   Collection Time: 10/07/23  9:51 PM   Specimen: BLOOD LEFT ARM  Result Value Ref Range Status   Specimen Description   Final    BLOOD LEFT ARM Performed at [facility], [address], [place], [place] [address]    Special Requests   Final    BOTTLES DRAWN AEROBIC  AND ANAEROBIC Blood Culture results may not be optimal due to an inadequate volume of blood received in culture bottles Performed at [facility], [address], [place], [place] [address]    Culture   Final    NO GROWTH 5 DAYS Performed at [facility], [address]  [place], [place] [address]    Report Status 10/13/2023 FINAL  Final  MRSA Next Gen by PCR, Nasal     Status: None   Collection Time: 10/07/23 10:01 PM   Specimen: Nasal Mucosa; Nasal Swab  Result Value Ref Range Status   MRSA by PCR Next Gen NOT DETECTED NOT DETECTED Final    Comment: (NOTE) The GeneXpert MRSA Assay (FDA approved for NASAL specimens only), is one component of a comprehensive MRSA colonization surveillance program. It is not intended to diagnose MRSA infection nor to guide or monitor treatment for MRSA infections. Test performance is not FDA approved in patients less than 61 years old. Performed at [facility], [address], [place], [place] [address]     Radiology Studies: No results found.  Scheduled Meds:  sodium chloride    Intravenous Once   acyclovir   400 mg Oral Daily   allopurinol   150 mg Oral Daily   Chlorhexidine  Gluconate Cloth  6 each Topical Daily   doxepin   25 mg Oral QHS   feeding supplement  237 mL Oral BID BM   gabapentin   100 mg Oral TID   HYDROmorphone   2 mg Oral QHS   insulin  aspart  0-9 Units Subcutaneous Q4H   methocarbamol   500 mg Oral QHS   pantoprazole   40 mg Oral QHS   sertraline   25 mg Oral Daily   simvastatin   20 mg Oral q1800   Continuous Infusions:   LOS: 9 days   Alejandro Marker, DO [facility] Available via Epic secure chat 7am-7pm After these hours, please refer to coverage provider listed on [email or web address] 10/16/2023, 6:14 PM

## 2023-10-17 DIAGNOSIS — R579 Shock, unspecified: Secondary | ICD-10-CM | POA: Diagnosis not present

## 2023-10-17 LAB — PHOSPHORUS: Phosphorus: 3.2 mg/dL (ref 2.5–4.6)

## 2023-10-17 LAB — GLUCOSE, CAPILLARY
Glucose-Capillary: 107 mg/dL — ABNORMAL HIGH (ref 70–99)
Glucose-Capillary: 122 mg/dL — ABNORMAL HIGH (ref 70–99)
Glucose-Capillary: 133 mg/dL — ABNORMAL HIGH (ref 70–99)
Glucose-Capillary: 92 mg/dL (ref 70–99)
Glucose-Capillary: 96 mg/dL (ref 70–99)
Glucose-Capillary: 99 mg/dL (ref 70–99)

## 2023-10-17 LAB — COMPREHENSIVE METABOLIC PANEL WITH GFR
ALT: 12 U/L (ref 0–44)
AST: 28 U/L (ref 15–41)
Albumin: 3.1 g/dL — ABNORMAL LOW (ref 3.5–5.0)
Alkaline Phosphatase: 87 U/L (ref 38–126)
Anion gap: 11 (ref 5–15)
BUN: 10 mg/dL (ref 8–23)
CO2: 22 mmol/L (ref 22–32)
Calcium: 8 mg/dL — ABNORMAL LOW (ref 8.9–10.3)
Chloride: 104 mmol/L (ref 98–111)
Creatinine, Ser: 0.73 mg/dL (ref 0.61–1.24)
GFR, Estimated: >60 mL/min (ref >60)
Glucose, Bld: 96 mg/dL (ref 70–99)
Potassium: 4.2 mmol/L (ref 3.5–5.1)
Sodium: 137 mmol/L (ref 135–145)
Total Bilirubin: 1.1 mg/dL (ref 0.0–1.2)
Total Protein: 4.6 g/dL — ABNORMAL LOW (ref 6.5–8.1)

## 2023-10-17 LAB — CBC WITH DIFFERENTIAL/PLATELET
Abs Immature Granulocytes: 0.05 K/uL (ref 0.00–0.07)
Basophils Absolute: 0 K/uL (ref 0.0–0.1)
Basophils Relative: 1 %
Eosinophils Absolute: 0.1 K/uL (ref 0.0–0.5)
Eosinophils Relative: 2 %
HCT: 26.8 % — ABNORMAL LOW (ref 39.0–52.0)
Hemoglobin: 8.3 g/dL — ABNORMAL LOW (ref 13.0–17.0)
Immature Granulocytes: 1 %
Lymphocytes Relative: 74 %
Lymphs Abs: 4.5 K/uL — ABNORMAL HIGH (ref 0.7–4.0)
MCH: 29.1 pg (ref 26.0–34.0)
MCHC: 31 g/dL (ref 30.0–36.0)
MCV: 94 fL (ref 80.0–100.0)
Monocytes Absolute: 0.4 K/uL (ref 0.1–1.0)
Monocytes Relative: 6 %
Neutro Abs: 1 K/uL — ABNORMAL LOW (ref 1.7–7.7)
Neutrophils Relative %: 16 %
Platelets: 26 K/uL — CL (ref 150–400)
RBC: 2.85 MIL/uL — ABNORMAL LOW (ref 4.22–5.81)
RDW: 17.2 % — ABNORMAL HIGH (ref 11.5–15.5)
WBC: 6 K/uL (ref 4.0–10.5)
nRBC: 0.5 % — ABNORMAL HIGH (ref 0.0–0.2)

## 2023-10-17 LAB — MAGNESIUM: Magnesium: 1.8 mg/dL (ref 1.7–2.4)

## 2023-10-17 NOTE — Plan of Care (Signed)
  Problem: Education: Goal: Ability to describe self-care measures that may prevent or decrease complications (Diabetes Survival Skills Education) will improve 10/17/2023 0934 by Reyes Montie LABOR, RN Outcome: Progressing 10/17/2023 0923 by Reyes Montie LABOR, RN Outcome: Progressing Goal: Individualized Educational Video(s) 10/17/2023 0934 by Reyes Montie LABOR, RN Outcome: Progressing 10/17/2023 0923 by Reyes Montie LABOR, RN Outcome: Progressing   Problem: Coping: Goal: Ability to adjust to condition or change in health will improve 10/17/2023 0934 by Reyes Montie LABOR, RN Outcome: Progressing 10/17/2023 0923 by Reyes Montie LABOR, RN Outcome: Progressing   Problem: Fluid Volume: Goal: Ability to maintain a balanced intake and output will improve 10/17/2023 0934 by Reyes Montie LABOR, RN Outcome: Progressing 10/17/2023 0923 by Reyes Montie LABOR, RN Outcome: Progressing   Problem: Health Behavior/Discharge Planning: Goal: Ability to identify and utilize available resources and services will improve 10/17/2023 0934 by Reyes Montie LABOR, RN Outcome: Progressing 10/17/2023 0923 by Reyes Montie LABOR, RN Outcome: Progressing Goal: Ability to manage health-related needs will improve 10/17/2023 0934 by Reyes Montie LABOR, RN Outcome: Progressing 10/17/2023 0923 by Reyes Montie LABOR, RN Outcome: Progressing   Problem: Metabolic: Goal: Ability to maintain appropriate glucose levels will improve 10/17/2023 0934 by Reyes Montie LABOR, RN Outcome: Progressing 10/17/2023 0923 by Reyes Montie LABOR, RN Outcome: Progressing   Problem: Nutritional: Goal: Maintenance of adequate nutrition will improve 10/17/2023 0934 by Reyes Montie LABOR, RN Outcome: Progressing 10/17/2023 0923 by Reyes Montie LABOR, RN Outcome: Progressing   Problem: Skin Integrity: Goal: Risk for impaired skin integrity will decrease 10/17/2023 0934 by Reyes Montie LABOR, RN Outcome: Progressing 10/17/2023 0923 by Reyes Montie LABOR, RN Outcome:  Progressing   Problem: Tissue Perfusion: Goal: Adequacy of tissue perfusion will improve 10/17/2023 0934 by Reyes Montie LABOR, RN Outcome: Progressing 10/17/2023 0923 by Reyes Montie LABOR, RN Outcome: Progressing   Problem: Education: Goal: Knowledge of General Education information will improve Description: Including pain rating scale, medication(s)/side effects and non-pharmacologic comfort measures 10/17/2023 0934 by Reyes Montie LABOR, RN Outcome: Progressing 10/17/2023 0923 by Reyes Montie LABOR, RN Outcome: Progressing   Problem: Health Behavior/Discharge Planning: Goal: Ability to manage health-related needs will improve 10/17/2023 0934 by Reyes Montie LABOR, RN Outcome: Progressing 10/17/2023 0923 by Reyes Montie LABOR, RN Outcome: Not Progressing   Problem: Clinical Measurements: Goal: Ability to maintain clinical measurements within normal limits will improve 10/17/2023 0934 by Reyes Montie LABOR, RN Outcome: Progressing 10/17/2023 0923 by Reyes Montie LABOR, RN Outcome: Progressing Goal: Will remain free from infection Outcome: Progressing Goal: Diagnostic test results will improve Outcome: Progressing   Problem: Activity: Goal: Risk for activity intolerance will decrease 10/17/2023 0934 by Reyes Montie LABOR, RN Outcome: Progressing 10/17/2023 0923 by Reyes Montie LABOR, RN Outcome: Progressing   Problem: Nutrition: Goal: Adequate nutrition will be maintained 10/17/2023 0934 by Reyes Montie LABOR, RN Outcome: Progressing 10/17/2023 0923 by Reyes Montie LABOR, RN Outcome: Progressing   Problem: Coping: Goal: Level of anxiety will decrease Outcome: Progressing   Problem: Elimination: Goal: Will not experience complications related to bowel motility Outcome: Progressing Goal: Will not experience complications related to urinary retention Outcome: Progressing   Problem: Pain Managment: Goal: General experience of comfort will improve and/or be controlled Outcome: Progressing    Problem: Safety: Goal: Ability to remain free from injury will improve Outcome: Progressing   Problem: Skin Integrity: Goal: Risk for impaired skin integrity will decrease Outcome: Progressing

## 2023-10-17 NOTE — Progress Notes (Signed)
 Progress Note    MARCAS BOWSHER   FMW:[id number]  DOB: 22-Mar-1955  DOA: 10/07/2023     10 PCP: [facility]  Initial CC: syncope   Hospital Course: Tyler Scott is a 68 y.o. male with past medical history significant for CLL with recent initiation of chemotherapy, HTN, HLD, DM 1, chronic ITP (baseline Plt 40's) who presented to [facility] ED from the cancer center with complaints of abdominal pain, nausea, poor oral intake, generalized weakness with near syncope and shortness of breath.  Patient was due for next chemotherapy infusion today, 8/28 and when attempting to get to the car had an episode of near syncope with associated shortness of breath and generalized weakness.  EMS was activated in which she was noted to be tachycardic, hypotensive with mild AMS (improved shortly after arrival).  Patient's wife also noted hyperglycemia to the 300s the morning of 10/07/2023 in which she administered insulin .   On ED arrival, patient was afebrile with HR 116, BP 95/67, RR 25, SpO2 94%. Labs were notable for WBC 14.6 (down from 17.1 8/27 with ANC 1100), Hgb 1.7 (8.2), Plt 10 (47). INR > 10 (1 two months PTA). CMP/lipase pending. Trop 46. LA 2.5 > 5.4. VBG 7.21/pCO2 < 18/pO2 < 31/bicarb 5.6. Initial CBG 40. CXR demonstrated small L pleural effusion and L lung base atelectasis with R paratracheal adenoapthyp; CT A/P showed extensive bulky LAD throughout the lower chest, abdomen, pelvis and groin regions (similar from prior), splenic enlargement with peripheral splenic infarct, small lucent lesions in the pelvis c/f lymphomatous involvement, moderate L and small R pleural effusions with atelectasis. Broad-spectrum antibiotics were started, Cx pending. Levophed  started for hypotension. Oncology consulted. PCCM consulted for ICU admission.   Significant hospital events: 8/28: Admit ICU, started on vasopressors, oncology consulted; transfused 2 unit PRBC 8/29: Levophed  titrated off; received  rasburicase , 1 unit platelets, 1 unit PRBC transfusion 8/30: 1 unit platelet transfusion 8/31: Transferred to [facility]; 2 unit platelet transfusion 9/1: 1u platelet transfusion, transfer to 6 E. 9/2: transfuse 1u Plt, to receive NPLATE  per oncology  9/3: SNF Selected and [facility] to [facility] decision on [facility] for SN[facility] 9/5: Medically stable to D/C to SNF but awaiting Insurance Auth and [place] to make a decision and it will be done hopefully 10/18/23  Assessment and Plan:  Shock, undifferentiated Concern for sepsis, ruled out Patient presenting with weakness and noted to be hypotensive, initially admitted to the intensive care unit on vasopressors.  Suspect etiology likely secondary to severe dehydration, poor oral intake with recent chemotherapy infusion as well as tumor lysis syndrome as above.  Initially was started on empiric antibiotics with vancomycin , cefepime  and doxycycline  which were stopped. WBC stable at 5.7   Acute hypoxemic respiratory failure: Resolved; Supplemental oxygen weaned off.   CLL/SLL/low-grade lymphoma Tumor lysis syndrome Patient initially diagnosed with CLL 2012. Most recently patient received Cycle 1 Bendamustine  (without Rituxan ) on 09/08/23.  Now he is status post cycle 2 which was started on 10/06/2023.  Imaging 8/29 with extensive bulky lymphadenopathy throughout the lower chest, abdomen, pelvis and groin similar to previous study also with splenic enlargement, noted bone lesions progressive since prior study.  Uric acid 12.3, received rasburicase  on 8/28. -Uric acid 12.3>5.1..<1.5 on last check -Further per oncology and the plan is for Venetoclax  + Rituxan  the patient's platelet counts are better.  Will continue physical therapy efforts and dietary efforts while he is hospitalized and PT/OT recommending SNF.   Acute renal failure: Resolved; Suspect  etiology multifactorial with ATN from hypotension/shock as well as prerenal azotemia from severe dehydration.  No  hydronephrosis noted on CT abdomen/pelvis at time of admission.  IV fluids now discontinued and he was given IV Lasix  20 mg x1 due to pedal edema and he is +4.8785 Liters since admit. BUN/Cr Trend stable at 11/0.80 on last check. Avoid Nephrotoxic Medications, Contrast Dyes, Hypotension and Dehydration to Ensure Adequate Renal Perfusion and will need to Renally Adjust Meds. CTM and Trend Renal Function carefully and repeat CMP in the AM    Normocytic Anemia, severe: Hemoglobin on arrival 1.7; transfused 3 unit PRBCs -Transfuse for hemoglobin less than 7.0. CTM for S/Sx of Bleeding; No overt bleeding noted. Repeat CBC in the AM   Thrombocytopenia, severe Platelet count 10K arrival.  Received 1 unit platelet on 8/29, 8/30, 2 u on 8/31, 1u 9/1 and 1u on 9/2 -NPLATE  ordered by oncology 10/12/23 and recommending 4 mcg/kg weekly -Transfuse for counts <20K or <50K with bleeding  -CBC with diff daily; for further discussion with oncology they feel that he is okay from their point of view to discharge the patient given that his platelet count has been above 20,000 x 3 and he will be set up with weekly Nplate  and PRBC transfusion support with labs weekly starting on 10/19/2023.  He is supposed to start Venetoclax  in 2 weeks -CTM closely for bleeding   Hypokalemia: repleted  Hypophosphatemia:  - repleted  Hypomagnesemia - Repleted  Constipation -> Diarrhea: Constipation Improved s/p Bisacodyl  Suppository 10 mg x1 and now having diarrhea and abdomnal discomfort; Had Changed Docusate 100 mg po BID PRN mild Constipation and Miralax  17 gras po Dailyprn to Scheduled Senna-Docusate 1 tab po BID and Miralax  17 BID but will stop all Laxatives now   Essential HTN:  - s/p shock; now normotensive without meds  HLD:  C/w Simvastatin  20 mg p.o. daily   Type 1 diabetes mellitus:  - Continue SSI and CBG monitoring   Chronic pain: Patient reportedly on MS Contin  15 mg p.o. nightly, gabapentin  100 mg p.o. 3 times  daily Dilaudid  2 mg p.o. nightly Robaxin  500 mg p.o. nightly -Held home MS Contin  but continuing Continue Gabapentin  100 mg PO TID, Hydromorphone  2 mg PO at bedtime, and Robaxin  500 mg PO at bedtime; Also has IV Morphine  2 mg q3h prn Severe Pain  Depression and Anxiety: Psychiatry consulted for further evaluation they are recommending sertraline  25 mg p.o. daily and then titrating to 50 mg a day eventually.  Patient remains on Doxepin  25 mg nightly   Weakness/Debility/Deconditioning/Gait Disturbance: -PT currently recommending SNF now to address mobility and functional Decline  -Spouse reports unable to care for patient currently at home; [facility] consulted and SNF being pursued.    Hypoalbuminemia: Continue diet  Interval History:  No events overnight.  Resting comfortably in bed.  Remains medically stable and awaiting discharge to SNF.  Old records reviewed in assessment of this patient  Antimicrobials:   DVT prophylaxis:  SCDs Start: 10/07/23 1835   Code Status:   Code Status: Full Code  Mobility Assessment (Last 72 Hours)     Mobility Assessment     Row Name 10/17/23 0857 10/16/23 2100 10/16/23 0929 10/15/23 2006 10/15/23 0739   Does the patient have exclusion criteria? No - Perform mobility assessment No - Perform mobility assessment No - Perform mobility assessment No - Perform mobility assessment No - Perform mobility assessment   What is the highest level of mobility based on the mobility  assessment? Level 3 (Stands with assistance) - Balance while standing  and cannot march in place Level 3 (Stands with assistance) - Balance while standing  and cannot march in place Level 3 (Stands with assistance) - Balance while standing  and cannot march in place Level 3 (Stands with assistance) - Balance while standing  and cannot march in place Level 4 (Ambulates with assistance) - Balance while stepping forward/back - Complete   Is the above level different from baseline mobility prior to  current illness? Yes - Recommend PT order Yes - Recommend PT order Yes - Recommend PT order Yes - Recommend PT order Yes - Recommend PT order    Row Name 10/14/23 2020           Does the patient have exclusion criteria? No - Perform mobility assessment       What is the highest level of mobility based on the mobility assessment? Level 4 (Ambulates with assistance) - Balance while stepping forward/back - Complete       Is the above level different from baseline mobility prior to current illness? Yes - Recommend PT order          Barriers to discharge: [facility] approval  Disposition Plan:  SNF HH orders placed: n/a Status is: Inpt  Objective: Blood pressure (!) 147/67, pulse 95, temperature 98.6 F (37 C), temperature source Oral, resp. rate 18, height 5' 8 (1.727 m), weight 58.4 kg, SpO2 98%.  Examination:  Physical Exam Constitutional:      General: He is not in acute distress.    Appearance: Normal appearance.  HENT:     Head: Normocephalic and atraumatic.     Mouth/Throat:     Mouth: Mucous membranes are moist.  Eyes:     Extraocular Movements: Extraocular movements intact.  Cardiovascular:     Rate and Rhythm: Normal rate and regular rhythm.  Pulmonary:     Effort: Pulmonary effort is normal. No respiratory distress.     Breath sounds: Normal breath sounds. No wheezing.  Abdominal:     General: Bowel sounds are normal. There is no distension.     Palpations: Abdomen is soft.     Tenderness: There is no abdominal tenderness.  Musculoskeletal:        General: Normal range of motion.     Cervical back: Normal range of motion and neck supple.  Skin:    General: Skin is warm and dry.  Neurological:     General: No focal deficit present.     Mental Status: He is alert.  Psychiatric:        Mood and Affect: Mood normal.        Behavior: Behavior normal.      Consultants:    Procedures:    Data Reviewed: Results for orders placed or performed during the hospital  encounter of 10/07/23 (from the past 24 hours)  Glucose, capillary     Status: Abnormal   Collection Time: 10/16/23  4:22 PM  Result Value Ref Range   Glucose-Capillary 114 (H) 70 - 99 mg/dL  Glucose, capillary     Status: Abnormal   Collection Time: 10/16/23  8:26 PM  Result Value Ref Range   Glucose-Capillary 115 (H) 70 - 99 mg/dL  Glucose, capillary     Status: Abnormal   Collection Time: 10/16/23 11:59 PM  Result Value Ref Range   Glucose-Capillary 107 (H) 70 - 99 mg/dL  Glucose, capillary     Status: None   Collection Time:  10/17/23  4:22 AM  Result Value Ref Range   Glucose-Capillary 96 70 - 99 mg/dL  Magnesium      Status: None   Collection Time: 10/17/23  6:29 AM  Result Value Ref Range   Magnesium  1.8 1.7 - 2.4 mg/dL  Phosphorus     Status: None   Collection Time: 10/17/23  6:29 AM  Result Value Ref Range   Phosphorus 3.2 2.5 - 4.6 mg/dL  CBC with Differential/Platelet     Status: Abnormal   Collection Time: 10/17/23  6:29 AM  Result Value Ref Range   WBC 6.0 4.0 - 10.5 K/uL   RBC 2.85 (L) 4.22 - 5.81 MIL/uL   Hemoglobin 8.3 (L) 13.0 - 17.0 g/dL   HCT 73.1 (L) 60.9 - 47.9 %   MCV 94.0 80.0 - 100.0 fL   MCH 29.1 26.0 - 34.0 pg   MCHC 31.0 30.0 - 36.0 g/dL   RDW 82.7 (H) 88.4 - 84.4 %   Platelets 26 (LL) 150 - 400 K/uL   nRBC 0.5 (H) 0.0 - 0.2 %   Neutrophils Relative % 16 %   Neutro Abs 1.0 (L) 1.7 - 7.7 K/uL   Lymphocytes Relative 74 %   Lymphs Abs 4.5 (H) 0.7 - 4.0 K/uL   Monocytes Relative 6 %   Monocytes Absolute 0.4 0.1 - 1.0 K/uL   Eosinophils Relative 2 %   Eosinophils Absolute 0.1 0.0 - 0.5 K/uL   Basophils Relative 1 %   Basophils Absolute 0.0 0.0 - 0.1 K/uL   Immature Granulocytes 1 %   Abs Immature Granulocytes 0.05 0.00 - 0.07 K/uL   Smudge Cells PRESENT    Ovalocytes PRESENT   Comprehensive metabolic panel with GFR     Status: Abnormal   Collection Time: 10/17/23  6:29 AM  Result Value Ref Range   Sodium 137 135 - 145 mmol/L   Potassium 4.2  3.5 - 5.1 mmol/L   Chloride 104 98 - 111 mmol/L   CO2 22 22 - 32 mmol/L   Glucose, Bld 96 70 - 99 mg/dL   BUN 10 8 - 23 mg/dL   Creatinine, Ser 9.26 0.61 - 1.24 mg/dL   Calcium  8.0 (L) 8.9 - 10.3 mg/dL   Total Protein 4.6 (L) 6.5 - 8.1 g/dL   Albumin 3.1 (L) 3.5 - 5.0 g/dL   AST 28 15 - 41 U/L   ALT 12 0 - 44 U/L   Alkaline Phosphatase 87 38 - 126 U/L   Total Bilirubin 1.1 0.0 - 1.2 mg/dL   GFR, Estimated >39 >39 mL/min   Anion gap 11 5 - 15  Glucose, capillary     Status: Abnormal   Collection Time: 10/17/23  7:34 AM  Result Value Ref Range   Glucose-Capillary 133 (H) 70 - 99 mg/dL  Glucose, capillary     Status: None   Collection Time: 10/17/23 11:21 AM  Result Value Ref Range   Glucose-Capillary 99 70 - 99 mg/dL    I have reviewed pertinent nursing notes, vitals, labs, and images as necessary. I have ordered labwork to follow up on as indicated.  I have reviewed the last notes from staff over past 24 hours. I have discussed patient's care plan and test results with nursing staff, CM/SW, and other staff as appropriate.  Time spent: Greater than 50% of the 55 minute visit was spent in counseling/coordination of care for the patient as laid out in the A&P.   LOS: 10 days   Alm  Gage Weant, MD [facility] 10/17/2023, 4:02 PM

## 2023-10-17 NOTE — Plan of Care (Signed)
  Problem: Education: Goal: Ability to describe self-care measures that may prevent or decrease complications (Diabetes Survival Skills Education) will improve Outcome: Progressing   Problem: Metabolic: Goal: Ability to maintain appropriate glucose levels will improve Outcome: Progressing   Problem: Skin Integrity: Goal: Risk for impaired skin integrity will decrease Outcome: Progressing   Problem: Tissue Perfusion: Goal: Adequacy of tissue perfusion will improve Outcome: Progressing   Problem: Coping: Goal: Ability to adjust to condition or change in health will improve Outcome: Not Progressing   Problem: Fluid Volume: Goal: Ability to maintain a balanced intake and output will improve Outcome: Not Progressing   Problem: Nutritional: Goal: Maintenance of adequate nutrition will improve Outcome: Not Progressing Goal: Progress toward achieving an optimal weight will improve Outcome: Not Progressing

## 2023-10-18 DIAGNOSIS — R579 Shock, unspecified: Secondary | ICD-10-CM | POA: Diagnosis not present

## 2023-10-18 LAB — PATHOLOGIST SMEAR REVIEW

## 2023-10-18 LAB — CBC WITH DIFFERENTIAL/PLATELET
Abs Immature Granulocytes: 0.07 K/uL (ref 0.00–0.07)
Basophils Absolute: 0.1 K/uL (ref 0.0–0.1)
Basophils Relative: 1 %
Eosinophils Absolute: 0.1 K/uL (ref 0.0–0.5)
Eosinophils Relative: 2 %
HCT: 27.5 % — ABNORMAL LOW (ref 39.0–52.0)
Hemoglobin: 8.9 g/dL — ABNORMAL LOW (ref 13.0–17.0)
Immature Granulocytes: 1 %
Lymphocytes Relative: 73 %
Lymphs Abs: 4.7 K/uL — ABNORMAL HIGH (ref 0.7–4.0)
MCH: 30.1 pg (ref 26.0–34.0)
MCHC: 32.4 g/dL (ref 30.0–36.0)
MCV: 92.9 fL (ref 80.0–100.0)
Monocytes Absolute: 0.5 K/uL (ref 0.1–1.0)
Monocytes Relative: 7 %
Neutro Abs: 1 K/uL — ABNORMAL LOW (ref 1.7–7.7)
Neutrophils Relative %: 16 %
Platelets: 34 K/uL — ABNORMAL LOW (ref 150–400)
RBC: 2.96 MIL/uL — ABNORMAL LOW (ref 4.22–5.81)
RDW: 17.5 % — ABNORMAL HIGH (ref 11.5–15.5)
WBC: 6.5 K/uL (ref 4.0–10.5)
nRBC: 0 % (ref 0.0–0.2)

## 2023-10-18 LAB — GLUCOSE, CAPILLARY
Glucose-Capillary: 107 mg/dL — ABNORMAL HIGH (ref 70–99)
Glucose-Capillary: 125 mg/dL — ABNORMAL HIGH (ref 70–99)
Glucose-Capillary: 143 mg/dL — ABNORMAL HIGH (ref 70–99)
Glucose-Capillary: 151 mg/dL — ABNORMAL HIGH (ref 70–99)
Glucose-Capillary: 179 mg/dL — ABNORMAL HIGH (ref 70–99)
Glucose-Capillary: 190 mg/dL — ABNORMAL HIGH (ref 70–99)

## 2023-10-18 MED ORDER — ROMIPLOSTIM INJECTION 500 MCG
5.0000 ug/kg | Freq: Once | SUBCUTANEOUS | Status: DC
  Filled 2023-10-18: qty 0.58

## 2023-10-18 MED ORDER — CALCIUM CARBONATE ANTACID 500 MG PO CHEW
200.0000 mg | CHEWABLE_TABLET | Freq: Three times a day (TID) | ORAL | Status: DC | PRN
  Administered 2023-10-18 – 2023-10-23 (×6): 200 mg via ORAL
  Filled 2023-10-18 (×7): qty 1

## 2023-10-18 MED ORDER — ROMIPLOSTIM INJECTION 500 MCG
5.0000 ug/kg | Freq: Once | SUBCUTANEOUS | Status: AC
  Administered 2023-10-18: 290 ug via SUBCUTANEOUS
  Filled 2023-10-18: qty 0.58

## 2023-10-18 NOTE — Progress Notes (Signed)
 PT Cancellation Note  Patient Details Name: Tyler Scott MRN: [id number] DOB: February 04, 1956   Cancelled Treatment:    Reason Eval/Treat Not Completed: Medical issues which prohibited therapy  Patient reports having loose BM. [name] PT Acute Rehabilitation Services Office [phone]  [name] 10/18/2023, 2:55 PM

## 2023-10-18 NOTE — Plan of Care (Signed)
  Problem: Education: Goal: Ability to describe self-care measures that may prevent or decrease complications (Diabetes Survival Skills Education) will improve Outcome: Progressing   Problem: Metabolic: Goal: Ability to maintain appropriate glucose levels will improve Outcome: Progressing   Problem: Skin Integrity: Goal: Risk for impaired skin integrity will decrease Outcome: Progressing   Problem: Tissue Perfusion: Goal: Adequacy of tissue perfusion will improve Outcome: Progressing   Problem: Clinical Measurements: Goal: Ability to maintain clinical measurements within normal limits will improve Outcome: Progressing Goal: Will remain free from infection Outcome: Progressing Goal: Diagnostic test results will improve Outcome: Progressing   Problem: Activity: Goal: Risk for activity intolerance will decrease Outcome: Progressing   Problem: Coping: Goal: Level of anxiety will decrease Outcome: Progressing   Problem: Elimination: Goal: Will not experience complications related to bowel motility Outcome: Progressing   Problem: Pain Managment: Goal: General experience of comfort will improve and/or be controlled Outcome: Progressing   Problem: Safety: Goal: Ability to remain free from injury will improve Outcome: Progressing   Problem: Skin Integrity: Goal: Risk for impaired skin integrity will decrease Outcome: Progressing

## 2023-10-18 NOTE — Plan of Care (Signed)

## 2023-10-18 NOTE — TOC Progression Note (Signed)
 Transition of Care [facility]) - Progression Note    Patient Details  Name: Tyler Scott MRN: [id number] Date of Birth: [birth year]  [facility] of [facility]) CM/SW Contact  Toy LITTIE Agar, RN Phone Number:[phone]  10/18/2023, 4:03 PM  Clinical Narrative:    CM received message to return call to [place]. Cm attempted to contact [facility]  [phone] ext [phone], There is no answer voicemail has been left.    Expected Discharge Plan: Home w Home Health Services Barriers to Discharge: Continued Medical Work up               Expected Discharge Plan and Services In-house Referral: Clinical Social Work Discharge Planning Services: NA Post Acute Care Choice: Home Health Living arrangements for the past 2 months: Single Family Home                 DME Arranged: N/A DME Agency: NA       HH Arranged: NA HH Agency: Other - See comment Producer, television/film/video) Date HH Agency Contacted: 10/11/23 Time HH Agency Contacted: 1422 Representative spoke with at [facility] Agency: Jon   Social Drivers of Health (SDOH) Interventions SDOH Screenings   Food Insecurity: No Food Insecurity (10/08/2023)  Housing: Low Risk  (10/08/2023)  Transportation Needs: No Transportation Needs (10/08/2023)  Utilities: Not At Risk (10/08/2023)  Depression (PHQ2-9): Low Risk  (09/14/2023)  Social Connections: Unknown (10/08/2023)  Recent Concern: Social Connections - Moderately Isolated (10/08/2023)  Tobacco Use: Low Risk  (10/07/2023)    Readmission Risk Interventions    10/11/2023    2:19 PM 08/30/2023   11:40 AM 08/07/2023    8:49 AM  Readmission Risk Prevention Plan  Transportation Screening Complete Complete Complete  PCP or Specialist Appt within 5-7 Days   Complete  PCP or Specialist Appt within 3-5 Days  Complete   Home Care Screening   Complete  Medication Review (RN CM)   Complete  HRI or Home Care Consult  Complete   Social Work Consult for Recovery Care Planning/Counseling  Complete   Palliative Care  Screening  Complete   Medication Review Oceanographer) Complete Complete   PCP or Specialist appointment within 3-5 days of discharge Complete    HRI or Home Care Consult Complete    SW Recovery Care/Counseling Consult Complete    Palliative Care Screening Not Applicable    Skilled Nursing Facility Not Applicable

## 2023-10-18 NOTE — Progress Notes (Signed)
 PROGRESS NOTE    Tyler Scott  FMW:[id number] DOB: Nov 07, 1955 DOA: 10/07/2023 PCP: [facility]   Brief Narrative:  This 68 yrs old male with past medical history significant for CLL with recent initiation of chemotherapy, HTN, HLD, DM 1, chronic ITP (baseline Plt 40's) who presented to [facility] ED from the cancer center with complaints of abdominal pain, nausea, poor oral intake, generalized weakness with near syncope and shortness of breath.  Patient was due for next chemotherapy infusion on, 8/28 and when attempting to get to the car had an episode of near syncope with associated shortness of breath and generalized weakness.  EMS was activated in which he was noted to be tachycardic, hypotensive with mild AMS (improved shortly after arrival).  Patient's wife also noted hyperglycemia to the 300s the morning of 10/07/2023 in which she administered insulin .   Labs were notable for WBC 14.6 (down from 17.1 8/27 with ANC 1100), Hgb 7.1 (8.2), Plt 10 (47). INR > 10 (1 two months PTA). CMP/lipase pending. Trop 46. LA 2.5 > 5.4. VBG 7.21/pCO2 < 18/pO2 < 31/bicarb 5.6. Initial CBG 40. CXR demonstrated small L pleural effusion and L lung base atelectasis with R paratracheal adenoapthyp; CT A/P showed extensive bulky LAD throughout the lower chest, abdomen, pelvis and groin regions (similar from prior), splenic enlargement with peripheral splenic infarct, small lucent lesions in the pelvis c/f lymphomatous involvement, moderate L and small R pleural effusions with atelectasis. Broad-spectrum antibiotics were started, Cx pending. Levophed  started for hypotension. Oncology consulted. PCCM consulted for ICU admission.   Assessment & Plan:   Principal Problem:   Shock (HCC) Active Problems:   Vitamin K deficiency   Tumor lysis syndrome   Symptomatic anemia   Pressure injury of skin   Adjustment disorder with mixed anxiety and depressed mood   Shock, undifferentiated: > Resolved. Concern for  sepsis, ruled out Patient presenting with weakness and noted to be hypotensive, initially admitted to the intensive care unit on vasopressors.  Suspect etiology likely secondary to severe dehydration, poor oral intake with recent chemotherapy infusion as well as tumor lysis syndrome as above.  Initially was started on empiric antibiotics with vancomycin , cefepime  and doxycycline  which were stopped. WBC stable at 5.7.    Acute hypoxemic respiratory failure: Resolved;  Supplemental oxygen weaned off.   CLL/SLL/low-grade lymphoma Tumor lysis syndrome Patient initially diagnosed with CLL 2012. Most recently patient received Cycle 1 Bendamustine  (without Rituxan ) on 09/08/23.  Now he is status post cycle 2 which was started on 10/06/2023.  Imaging 8/29 with extensive bulky lymphadenopathy throughout the lower chest, abdomen, pelvis and groin similar to previous study also with splenic enlargement, noted bone lesions progressive since prior study.  Uric acid 12.3, received rasburicase  on 8/28. -Uric acid 12.3>5.1..<1.5 on last check -Further per oncology and the plan is for Venetoclax  + Rituxan  the patient's platelet counts are better.   Will continue physical therapy efforts and dietary efforts while he is hospitalized and PT/OT recommending SNF.   Acute renal failure: Resolved;  Suspect etiology multifactorial with ATN from hypotension/shock as well as prerenal azotemia from severe dehydration.  BUN/Cr Trend stable at 11/0.80 on last check. Avoid Nephrotoxic Medications, Contrast Dyes, Hypotension and Dehydration to Ensure Adequate Renal Perfusion and will need to Renally Adjust Meds. CTM and Trend Renal Function carefully and repeat CMP in the AM    Normocytic Anemia, severe:  Hemoglobin on arrival 1.7; transfused 3 unit PRBCs -Transfuse for hemoglobin less than 7.0.  - CTM for  S/Sx of Bleeding; No overt bleeding noted. Repeat CBC in the AM   Thrombocytopenia, severe: Platelet count 10K arrival.   Received 1 unit platelet on 8/29, 8/30, 2 u on 8/31, 1u 9/1 and 1u on 9/2 -NPLATE  ordered by oncology 10/12/23 and recommending 4 mcg/kg weekly -Transfuse for counts <20K or <50K with bleeding  -CBC with diff daily; for further discussion with oncology they feel that he is okay from their point of view to discharge. Patient given that his platelet count has been above 20,000 x 3 and he will be set up with weekly Nplate  and PRBC transfusion support with labs weekly starting on 10/19/2023.  He is supposed to start Venetoclax  in 2 weeks -CTM closely for bleeding   Hypokalemia: repleted. Continue to monitor.   Hypophosphatemia:  - repleted   Hypomagnesemia - Repleted   Constipation -> Diarrhea:  Constipation Improved s/p Bisacodyl  Suppository 10 mg x1 and now having diarrhea and abdomnal discomfort; Had Changed Docusate 100 mg po BID PRN mild Constipation and Miralax  17 gras po Dailyprn to Scheduled Senna-Docusate 1 tab po BID and Miralax  17 BID but will stop all Laxatives now   Essential HTN:  - s/p shock; now normotensive without meds   HLD:  C/w Simvastatin  20 mg p.o. daily   Type 1 diabetes mellitus:  - Continue SSI and CBG monitoring   Chronic pain:  Patient reportedly on MS Contin  15 mg p.o. nightly, gabapentin  100 mg p.o. 3 times daily Dilaudid  2 mg p.o. nightly Robaxin  500 mg p.o. nightly -Held home MS Contin  but continuing Continue Gabapentin  100 mg PO TID, Hydromorphone  2 mg PO at bedtime, and Robaxin  500 mg PO at bedtime; Also has IV Morphine  2 mg q3h prn Severe Pain   Depression and Anxiety:  Psychiatry consulted for further evaluation they are recommending sertraline  25 mg p.o. daily and then titrating to 50 mg a day eventually.  Patient remains on Doxepin  25 mg nightly   Weakness/Debility/Deconditioning/Gait Disturbance: -PT currently recommending SNF now to address mobility and functional Decline  -Spouse reports unable to care for patient currently at home; [facility] consulted and  SNF being pursued.    Hypoalbuminemia: Continue diet   DVT prophylaxis: SCDs Code Status: Full code Family Communication: No family at bed side Disposition Plan: Status is: Inpatient Remains inpatient appropriate because: Severity of illness    Consultants:  Oncology  Procedures:  Antimicrobials: Anti-infectives (From admission, onward)    Start     Dose/Rate Route Frequency Ordered Stop   10/10/23 1000  acyclovir  (ZOVIRAX ) tablet 400 mg        400 mg Oral Daily 10/10/23 0823     10/07/23 2000  doxycycline  (VIBRAMYCIN ) 100 mg in sodium chloride  0.9 % 250 mL IVPB  Status:  Discontinued        100 mg 125 mL/hr over 120 Minutes Intravenous Every 12 hours 10/07/23 1849 10/09/23 1140   10/07/23 1700  vancomycin  (VANCOREADY) IVPB 1250 mg/250 mL        1,250 mg 166.7 mL/hr over 90 Minutes Intravenous  Once 10/07/23 1625 10/07/23 1919   10/07/23 1630  ceFEPIme  (MAXIPIME ) 2 g in sodium chloride  0.9 % 100 mL IVPB        2 g 200 mL/hr over 30 Minutes Intravenous  Once 10/07/23 1625 10/07/23 1728       Subjective: Patient seen and examined at bedside.  Overnight events noted. Patient was sitting comfortably on the chair and states he is getting chemotherapy and radiation therapy,  stated he is feeling better.  Objective: Vitals:   10/18/23 0400 10/18/23 0425 10/18/23 0500 10/18/23 1348  BP:  (!) 153/68  139/68  Pulse:  98  96  Resp: (!) 26 18  15   Temp:  98.9 F (37.2 C)  97.6 F (36.4 C)  TempSrc:  Oral  Oral  SpO2:  97%  98%  Weight:   57.9 kg   Height:        Intake/Output Summary (Last 24 hours) at 10/18/2023 1421 Last data filed at 10/17/2023 1613 Gross per 24 hour  Intake --  Output 200 ml  Net -200 ml   Filed Weights   10/16/23 0709 10/17/23 0705 10/18/23 0500  Weight: 58.9 kg 58.4 kg 57.9 kg    Examination:  General exam: Appears calm and comfortable, deconditioned, not in any distress. Respiratory system: Clear to auscultation. Respiratory effort normal.   RR 18  Cardiovascular system: S1 & S2 heard, RRR. No JVD, murmurs, rubs, gallops or clicks. Gastrointestinal system: Abdomen is non distended, soft and non tender. Normal bowel sounds heard. Central nervous system: Alert and oriented x 3. No focal neurological deficits. Extremities: No edema, no cyanosis, no clubbing Skin: No rashes, lesions or ulcers Psychiatry: Judgement and insight appear normal. Mood & affect appropriate.     Data Reviewed: I have personally reviewed following labs and imaging studies  CBC: Recent Labs  Lab 10/14/23 0532 10/15/23 0526 10/16/23 0744 10/17/23 0629 10/18/23 0510  WBC 7.0 6.1 5.7 6.0 6.5  NEUTROABS 1.2* 0.9* 1.0* 1.0* 1.0*  HGB 8.7* 8.3* 8.4* 8.3* 8.9*  HCT 27.6* 26.6* 26.9* 26.8* 27.5*  MCV 93.6 92.4 93.1 94.0 92.9  PLT 21* 21* 21* 26* 34*   Basic Metabolic Panel: Recent Labs  Lab 10/13/23 0554 10/14/23 0532 10/15/23 0526 10/16/23 0744 10/17/23 0629  NA 141 138 140 140 137  K 3.7 3.8 3.5 4.1 4.2  CL 106 104 105 105 104  CO2 25 24 25 24 22   GLUCOSE 77 81 89 153* 96  BUN 9 8 10 11 10   CREATININE 0.66 0.68 0.74 0.80 0.73  CALCIUM  7.6* 7.8* 8.1* 8.0* 8.0*  MG 1.4* 2.1 1.9 1.8 1.8  PHOS  --  2.2* 3.0 3.2 3.2   GFR: Estimated Creatinine Clearance: 73.4 mL/min (by C-G formula based on SCr of 0.73 mg/dL). Liver Function Tests: Recent Labs  Lab 10/14/23 0532 10/15/23 0526 10/16/23 0744 10/17/23 0629  AST 20 21 25 28   ALT 9 10 12 12   ALKPHOS 88 87 89 87  BILITOT 1.1 1.1 1.1 1.1  PROT 4.6* 4.5* 4.6* 4.6*  ALBUMIN 3.0* 3.1* 3.2* 3.1*   No results for input(s): LIPASE, AMYLASE in the last 168 hours. No results for input(s): AMMONIA in the last 168 hours. Coagulation Profile: No results for input(s): INR, PROTIME in the last 168 hours. Cardiac Enzymes: No results for input(s): CKTOTAL, CKMB, CKMBINDEX, TROPONINI in the last 168 hours. BNP (last 3 results) No results for input(s): PROBNP in the last 8760  hours. HbA1C: No results for input(s): HGBA1C in the last 72 hours. CBG: Recent Labs  Lab 10/17/23 2006 10/18/23 0027 10/18/23 0428 10/18/23 0754 10/18/23 1200  GLUCAP 92 179* 143* 151* 190*   Lipid Profile: No results for input(s): CHOL, HDL, LDLCALC, TRIG, CHOLHDL, LDLDIRECT in the last 72 hours. Thyroid  Function Tests: No results for input(s): TSH, T4TOTAL, FREET4, T3FREE, THYROIDAB in the last 72 hours. Anemia Panel: No results for input(s): VITAMINB12, FOLATE, FERRITIN, TIBC, IRON, RETICCTPCT in the  last 72 hours. Sepsis Labs: No results for input(s): PROCALCITON, LATICACIDVEN in the last 168 hours.  No results found for this or any previous visit (from the past 240 hours).   Radiology Studies: No results found.  Scheduled Meds:  sodium chloride    Intravenous Once   acyclovir   400 mg Oral Daily   allopurinol   150 mg Oral Daily   Chlorhexidine  Gluconate Cloth  6 each Topical Daily   doxepin   25 mg Oral QHS   feeding supplement  237 mL Oral BID BM   gabapentin   100 mg Oral TID   HYDROmorphone   2 mg Oral QHS   insulin  aspart  0-9 Units Subcutaneous Q4H   methocarbamol   500 mg Oral QHS   pantoprazole   40 mg Oral QHS   sertraline   25 mg Oral Daily   simvastatin   20 mg Oral q1800   Continuous Infusions:   LOS: 11 days    Time spent: 50 mins    Darcel Dawley, MD [facility]   If 7PM-7AM, please contact night-coverage

## 2023-10-19 DIAGNOSIS — R579 Shock, unspecified: Secondary | ICD-10-CM | POA: Diagnosis not present

## 2023-10-19 LAB — CBC WITH DIFFERENTIAL/PLATELET
Abs Immature Granulocytes: 0.03 K/uL (ref 0.00–0.07)
Basophils Absolute: 0 K/uL (ref 0.0–0.1)
Basophils Relative: 1 %
Eosinophils Absolute: 0.1 K/uL (ref 0.0–0.5)
Eosinophils Relative: 1 %
HCT: 27.1 % — ABNORMAL LOW (ref 39.0–52.0)
Hemoglobin: 8.7 g/dL — ABNORMAL LOW (ref 13.0–17.0)
Immature Granulocytes: 1 %
Lymphocytes Relative: 73 %
Lymphs Abs: 3.8 K/uL (ref 0.7–4.0)
MCH: 29.8 pg (ref 26.0–34.0)
MCHC: 32.1 g/dL (ref 30.0–36.0)
MCV: 92.8 fL (ref 80.0–100.0)
Monocytes Absolute: 0.4 K/uL (ref 0.1–1.0)
Monocytes Relative: 8 %
Neutro Abs: 0.8 K/uL — ABNORMAL LOW (ref 1.7–7.7)
Neutrophils Relative %: 16 %
Platelets: 33 K/uL — ABNORMAL LOW (ref 150–400)
RBC: 2.92 MIL/uL — ABNORMAL LOW (ref 4.22–5.81)
RDW: 17.2 % — ABNORMAL HIGH (ref 11.5–15.5)
Smear Review: NORMAL
WBC: 5.2 K/uL (ref 4.0–10.5)
nRBC: 0.4 % — ABNORMAL HIGH (ref 0.0–0.2)

## 2023-10-19 LAB — BASIC METABOLIC PANEL WITH GFR
Anion gap: 10 (ref 5–15)
BUN: 11 mg/dL (ref 8–23)
CO2: 23 mmol/L (ref 22–32)
Calcium: 8.3 mg/dL — ABNORMAL LOW (ref 8.9–10.3)
Chloride: 102 mmol/L (ref 98–111)
Creatinine, Ser: 0.79 mg/dL (ref 0.61–1.24)
GFR, Estimated: >60 mL/min (ref >60)
Glucose, Bld: 114 mg/dL — ABNORMAL HIGH (ref 70–99)
Potassium: 4.1 mmol/L (ref 3.5–5.1)
Sodium: 135 mmol/L (ref 135–145)

## 2023-10-19 LAB — PHOSPHORUS: Phosphorus: 3.3 mg/dL (ref 2.5–4.6)

## 2023-10-19 LAB — GLUCOSE, CAPILLARY
Glucose-Capillary: 103 mg/dL — ABNORMAL HIGH (ref 70–99)
Glucose-Capillary: 115 mg/dL — ABNORMAL HIGH (ref 70–99)
Glucose-Capillary: 119 mg/dL — ABNORMAL HIGH (ref 70–99)
Glucose-Capillary: 127 mg/dL — ABNORMAL HIGH (ref 70–99)
Glucose-Capillary: 154 mg/dL — ABNORMAL HIGH (ref 70–99)
Glucose-Capillary: 163 mg/dL — ABNORMAL HIGH (ref 70–99)
Glucose-Capillary: 80 mg/dL (ref 70–99)

## 2023-10-19 LAB — MAGNESIUM: Magnesium: 1.7 mg/dL (ref 1.7–2.4)

## 2023-10-19 NOTE — Plan of Care (Signed)
  Problem: Education: Goal: Ability to describe self-care measures that may prevent or decrease complications (Diabetes Survival Skills Education) will improve Outcome: Progressing Goal: Individualized Educational Video(s) Outcome: Progressing   Problem: Coping: Goal: Ability to adjust to condition or change in health will improve Outcome: Progressing   Problem: Fluid Volume: Goal: Ability to maintain a balanced intake and output will improve Outcome: Progressing   Problem: Health Behavior/Discharge Planning: Goal: Ability to identify and utilize available resources and services will improve Outcome: Progressing Goal: Ability to manage health-related needs will improve Outcome: Progressing   Problem: Metabolic: Goal: Ability to maintain appropriate glucose levels will improve Outcome: Progressing   Problem: Nutritional: Goal: Maintenance of adequate nutrition will improve Outcome: Progressing Goal: Progress toward achieving an optimal weight will improve Outcome: Progressing   Problem: Skin Integrity: Goal: Risk for impaired skin integrity will decrease Outcome: Progressing   Problem: Education: Goal: Knowledge of General Education information will improve Description: Including pain rating scale, medication(s)/side effects and non-pharmacologic comfort measures Outcome: Progressing   Problem: Health Behavior/Discharge Planning: Goal: Ability to manage health-related needs will improve Outcome: Progressing   Problem: Clinical Measurements: Goal: Ability to maintain clinical measurements within normal limits will improve Outcome: Progressing Goal: Will remain free from infection Outcome: Progressing Goal: Diagnostic test results will improve Outcome: Progressing Goal: Respiratory complications will improve Outcome: Progressing Goal: Cardiovascular complication will be avoided Outcome: Progressing   Problem: Nutrition: Goal: Adequate nutrition will be  maintained Outcome: Progressing   Problem: Coping: Goal: Level of anxiety will decrease Outcome: Progressing   Problem: Pain Managment: Goal: General experience of comfort will improve and/or be controlled Outcome: Progressing   Problem: Safety: Goal: Ability to remain free from injury will improve Outcome: Progressing   Problem: Skin Integrity: Goal: Risk for impaired skin integrity will decrease Outcome: Progressing

## 2023-10-19 NOTE — Progress Notes (Signed)
 PROGRESS NOTE    Tyler Scott  FMW:[id number] DOB: 25-Jan-1956 DOA: 10/07/2023 PCP: [facility]   Brief Narrative:  This 68 yrs old male with past medical history significant for CLL with recent initiation of chemotherapy, HTN, HLD, DM 1, chronic ITP (baseline Plt 40's) who presented to [facility] ED from the cancer center with complaints of abdominal pain, nausea, poor oral intake, generalized weakness with near syncope and shortness of breath.  Patient was due for next chemotherapy infusion on, 8/28 and when attempting to get to the car had an episode of near syncope with associated shortness of breath and generalized weakness.  EMS was activated in which he was noted to be tachycardic, hypotensive with mild AMS (improved shortly after arrival).  Patient's wife also noted hyperglycemia to the 300s the morning of 10/07/2023 in which she administered insulin .   Labs were notable for WBC 14.6 (down from 17.1 8/27 with ANC 1100), Hgb 7.1 (8.2), Plt 10 (47). INR > 10 (1 two months PTA). CMP/lipase pending. Trop 46. LA 2.5 > 5.4. VBG 7.21/pCO2 < 18/pO2 < 31/bicarb 5.6. Initial CBG 40. CXR demonstrated small L pleural effusion and L lung base atelectasis with R paratracheal adenoapthyp; CT A/P showed extensive bulky LAD throughout the lower chest, abdomen, pelvis and groin regions (similar from prior), splenic enlargement with peripheral splenic infarct, small lucent lesions in the pelvis c/f lymphomatous involvement, moderate L and small R pleural effusions with atelectasis. Broad-spectrum antibiotics were started, Cx pending. Levophed  started for hypotension. Oncology consulted. PCCM consulted for ICU admission.   Assessment & Plan:   Principal Problem:   Shock (HCC) Active Problems:   Vitamin K deficiency   Tumor lysis syndrome   Symptomatic anemia   Pressure injury of skin   Adjustment disorder with mixed anxiety and depressed mood   Shock, undifferentiated: > Resolved. Concern for  sepsis, ruled out Patient presented with weakness and noted to be hypotensive, initially admitted to the intensive care unit on vasopressors.  Suspect etiology likely secondary to severe dehydration, poor oral intake with recent chemotherapy infusion as well as tumor lysis syndrome as above.  Initially was started on empiric antibiotics with vancomycin , cefepime  and doxycycline  which were stopped. WBC stable at 5.7.    Acute hypoxemic respiratory failure: Resolved;  Supplemental oxygen weaned off.   CLL/SLL/low-grade lymphoma Tumor lysis syndrome Patient initially diagnosed with CLL 2012. Most recently patient received Cycle 1 Bendamustine  (without Rituxan ) on 09/08/23.  Now he is status post cycle 2 which was started on 10/06/2023.  Imaging 8/29 with extensive bulky lymphadenopathy throughout the lower chest, abdomen, pelvis and groin similar to previous study also with splenic enlargement, noted bone lesions progressive since prior study.  Uric acid 12.3, received rasburicase  on 8/28. -Uric acid 12.3>5.1..<1.5 on last check -Further per oncology and the plan is for Venetoclax  + Rituxan  the patient's platelet counts are better.   Continue physical therapy efforts and dietary efforts while he is hospitalized and PT/OT recommending SNF.   Acute renal failure: Resolved;  Suspect etiology multifactorial with ATN from hypotension / shock as well as prerenal azotemia from severe dehydration.  BUN/Cr Trend stable at 11/0.80 on last check. Avoid Nephrotoxic Medications, Contrast Dyes, Hypotension and Dehydration to Ensure Adequate Renal Perfusion and will need to Renally Adjust Meds. CTM and Trend Renal Function carefully and repeat CMP in the AM.   Normocytic Anemia, severe:  Hemoglobin on arrival 1.7; transfused 3 unit PRBCs -Transfuse for hemoglobin less than 7.0.  - CTM for  S/Sx of Bleeding; No overt bleeding noted. Repeat CBC in the AM   Thrombocytopenia, severe: Platelet count 10K arrival.   Received 1 unit platelet on 8/29, 8/30, 2 u on 8/31, 1u 9/1 and 1u on 9/2 -NPLATE  ordered by oncology 10/12/23 and recommending 4 mcg/kg weekly -Transfuse for counts <20K or <50K with bleeding. -CBC with diff daily; for further discussion with oncology they feel that he is okay from their point of view to discharge. Patient given that his platelet count has been above 20,000 x 3 and he will be set up with weekly Nplate  and PRBC transfusion support with labs weekly starting on 10/19/2023.  He is supposed to start Venetoclax  in 2 weeks -CTM closely for bleeding   Hypokalemia: repleted. Continue to monitor.   Hypophosphatemia:  - repleted   Hypomagnesemia; - Repleted   Constipation -> Diarrhea:  Constipation Improved s/p Bisacodyl  Suppository 10 mg x1 and now having diarrhea and abdomnal discomfort;  Continue supportive care.  Essential HTN:  - s/p shock; now normotensive without meds.   HLD:  C/w Simvastatin  20 mg p.o. daily.   Type 1 diabetes mellitus:  - Continue SSI and CBG monitoring.   Chronic pain:  Patient reportedly on MS Contin  15 mg p.o. nightly, gabapentin  100 mg p.o. 3 times daily,  Dilaudid  2 mg p.o. nightly, Robaxin  500 mg p.o. nightly -Held home MS Contin  but continuing Continue Gabapentin  100 mg PO TID, Hydromorphone  2 mg PO at bedtime, and Robaxin  500 mg PO at bedtime; Also has IV Morphine  2 mg q3h prn Severe Pain   Depression and Anxiety:  Psychiatry consulted for further evaluation they are recommending sertraline  25 mg p.o. daily and then titrating to 50 mg a day eventually.  Patient remains on Doxepin  25 mg nightly   Weakness/Debility/Deconditioning/Gait Disturbance: -PT currently recommending SNF now to address mobility and functional Decline. -Spouse reports unable to care for patient currently at home; [facility] consulted and SNF being pursued.    Hypoalbuminemia: Continue diet   DVT prophylaxis: SCDs Code Status: Full code Family Communication: No family at bed  side Disposition Plan: Status is: Inpatient Remains inpatient appropriate because: Severity of illness    Consultants:  Oncology  Procedures:  Antimicrobials: Anti-infectives (From admission, onward)    Start     Dose/Rate Route Frequency Ordered Stop   10/10/23 1000  acyclovir  (ZOVIRAX ) tablet 400 mg        400 mg Oral Daily 10/10/23 0823     10/07/23 2000  doxycycline  (VIBRAMYCIN ) 100 mg in sodium chloride  0.9 % 250 mL IVPB  Status:  Discontinued        100 mg 125 mL/hr over 120 Minutes Intravenous Every 12 hours 10/07/23 1849 10/09/23 1140   10/07/23 1700  vancomycin  (VANCOREADY) IVPB 1250 mg/250 mL        1,250 mg 166.7 mL/hr over 90 Minutes Intravenous  Once 10/07/23 1625 10/07/23 1919   10/07/23 1630  ceFEPIme  (MAXIPIME ) 2 g in sodium chloride  0.9 % 100 mL IVPB        2 g 200 mL/hr over 30 Minutes Intravenous  Once 10/07/23 1625 10/07/23 1728       Subjective: Patient seen and examined at bedside.  Overnight events noted. Patient was sitting comfortably on the chair and states he is getting chemotherapy and radiation therapy,   He states he is feeling better. He has presyncopal episode while having PT session.  Objective: Vitals:   10/18/23 1941 10/19/23 0426 10/19/23 0500 10/19/23 1357  BP: ROLLEN)  154/67 (!) 146/67  130/70  Pulse: 97 96  (!) 102  Resp: 18 18  20   Temp: 98.1 F (36.7 C) 98.3 F (36.8 C)  98.4 F (36.9 C)  TempSrc: Oral Oral  Oral  SpO2: 97% 96%  100%  Weight:   56.4 kg   Height:        Intake/Output Summary (Last 24 hours) at 10/19/2023 1422 Last data filed at 10/19/2023 1253 Gross per 24 hour  Intake 357 ml  Output 225 ml  Net 132 ml   Filed Weights   10/17/23 0705 10/18/23 0500 10/19/23 0500  Weight: 58.4 kg 57.9 kg 56.4 kg    Examination:  General exam: Appears calm and comfortable, deconditioned, not in any distress. Respiratory system: CTA Bilaterally . Respiratory effort normal.  RR 14  Cardiovascular system: S1 & S2 heard, RRR. No  JVD, murmurs, rubs, gallops or clicks. Gastrointestinal system: Abdomen is non distended, soft and non tender. Normal bowel sounds heard. Central nervous system: Alert and oriented x 3. No focal neurological deficits. Extremities: No edema, no cyanosis, no clubbing Skin: No rashes, lesions or ulcers Psychiatry: Judgement and insight appear normal. Mood & affect appropriate.     Data Reviewed: I have personally reviewed following labs and imaging studies  CBC: Recent Labs  Lab 10/15/23 0526 10/16/23 0744 10/17/23 0629 10/18/23 0510 10/19/23 0539  WBC 6.1 5.7 6.0 6.5 5.2  NEUTROABS 0.9* 1.0* 1.0* 1.0* 0.8*  HGB 8.3* 8.4* 8.3* 8.9* 8.7*  HCT 26.6* 26.9* 26.8* 27.5* 27.1*  MCV 92.4 93.1 94.0 92.9 92.8  PLT 21* 21* 26* 34* 33*   Basic Metabolic Panel: Recent Labs  Lab 10/14/23 0532 10/15/23 0526 10/16/23 0744 10/17/23 0629 10/19/23 0539  NA 138 140 140 137 135  K 3.8 3.5 4.1 4.2 4.1  CL 104 105 105 104 102  CO2 24 25 24 22 23   GLUCOSE 81 89 153* 96 114*  BUN 8 10 11 10 11   CREATININE 0.68 0.74 0.80 0.73 0.79  CALCIUM  7.8* 8.1* 8.0* 8.0* 8.3*  MG 2.1 1.9 1.8 1.8 1.7  PHOS 2.2* 3.0 3.2 3.2 3.3   GFR: Estimated Creatinine Clearance: 71.5 mL/min (by C-G formula based on SCr of 0.79 mg/dL). Liver Function Tests: Recent Labs  Lab 10/14/23 0532 10/15/23 0526 10/16/23 0744 10/17/23 0629  AST 20 21 25 28   ALT 9 10 12 12   ALKPHOS 88 87 89 87  BILITOT 1.1 1.1 1.1 1.1  PROT 4.6* 4.5* 4.6* 4.6*  ALBUMIN 3.0* 3.1* 3.2* 3.1*   No results for input(s): LIPASE, AMYLASE in the last 168 hours. No results for input(s): AMMONIA in the last 168 hours. Coagulation Profile: No results for input(s): INR, PROTIME in the last 168 hours. Cardiac Enzymes: No results for input(s): CKTOTAL, CKMB, CKMBINDEX, TROPONINI in the last 168 hours. BNP (last 3 results) No results for input(s): PROBNP in the last 8760 hours. HbA1C: No results for input(s): HGBA1C in the  last 72 hours. CBG: Recent Labs  Lab 10/18/23 1943 10/19/23 0011 10/19/23 0428 10/19/23 0733 10/19/23 1146  GLUCAP 125* 127* 80 154* 115*   Lipid Profile: No results for input(s): CHOL, HDL, LDLCALC, TRIG, CHOLHDL, LDLDIRECT in the last 72 hours. Thyroid  Function Tests: No results for input(s): TSH, T4TOTAL, FREET4, T3FREE, THYROIDAB in the last 72 hours. Anemia Panel: No results for input(s): VITAMINB12, FOLATE, FERRITIN, TIBC, IRON, RETICCTPCT in the last 72 hours. Sepsis Labs: No results for input(s): PROCALCITON, LATICACIDVEN in the last 168 hours.  No  results found for this or any previous visit (from the past 240 hours).   Radiology Studies: No results found.  Scheduled Meds:  sodium chloride    Intravenous Once   acyclovir   400 mg Oral Daily   allopurinol   150 mg Oral Daily   Chlorhexidine  Gluconate Cloth  6 each Topical Daily   doxepin   25 mg Oral QHS   feeding supplement  237 mL Oral BID BM   gabapentin   100 mg Oral TID   HYDROmorphone   2 mg Oral QHS   insulin  aspart  0-9 Units Subcutaneous Q4H   methocarbamol   500 mg Oral QHS   pantoprazole   40 mg Oral QHS   sertraline   25 mg Oral Daily   simvastatin   20 mg Oral q1800   Continuous Infusions:   LOS: 12 days    Time spent: 35 mins    Darcel Dawley, MD [facility]   If 7PM-7AM, please contact night-coverage

## 2023-10-19 NOTE — Progress Notes (Signed)
 Physical Therapy Treatment Patient Details Name: Tyler Scott MRN: [id number] DOB: 06/13/1955 Today's Date: 10/19/2023   History of Present Illness Pt sent from Cancer center to ED 2* SOB, weakness and syncope with BP of 79/42 with fall off garage steps into car with spouse.  Pt admitted with severe anemia, thrombocytopenia and hypotension.  Pt is currently undergoing chemo for CLL and has his of DM with diabetic retinopathy.    PT Comments   Patient reports dizziness upon standing, sat down and stood again  with onset of buckling down to recliner, brief  syncope but did not go out completely, BP's in doc flow sheet for orthostatics .  Unable to ambulate this visit due to  positive orthostaqsis when standing.   Continue PT as tolerated.  Patient will benefit from continued inpatient follow up therapy, <3 hours/day    If plan is discharge home, recommend the following: A little help with walking and/or transfers;A little help with bathing/dressing/bathroom;Assistance with cooking/housework;Assist for transportation;Help with stairs or ramp for entrance   Can travel by private vehicle     No  Equipment Recommendations  None recommended by PT    Recommendations for Other Services       Precautions / Restrictions Precautions Precautions: Fall Precaution/Restrictions Comments: orthostaice, near syncope     Mobility  Bed Mobility               General bed mobility comments: in recliner    Transfers Overall transfer level: Needs assistance Equipment used: Rolling walker (2 wheels) Transfers: Sit to/from Stand Sit to Stand: Min assist, Mod assist           General transfer comment: cues for hand placement and safety, stood x 1 and reported dizziness, sat down for BP 136/72, stood again  dizzy and buckled to recliner, head back, was able to respond immediately. 105/63    Ambulation/Gait                   Stairs             Wheelchair Mobility      Tilt Bed    Modified Rankin (Stroke Patients Only)       Balance Overall balance assessment: Needs assistance Sitting-balance support: No upper extremity supported, Feet supported Sitting balance-Leahy Scale: Fair     Standing balance support: Bilateral upper extremity supported, During functional activity, Reliant on assistive device for balance Standing balance-Leahy Scale: Poor Standing balance comment: poor standing tolerance and heavy reliance on RW                            Communication Communication Communication: No apparent difficulties  Cognition Arousal: Alert Behavior During Therapy: WFL for tasks assessed/performed, Flat affect   PT - Cognitive impairments: No apparent impairments                         Following commands: Intact      Cueing    Exercises      General Comments        Pertinent Vitals/Pain Pain Assessment Pain Assessment: No/denies pain    Home Living                          Prior Function            PT Goals (current goals can now be found in the care  plan section) Progress towards PT goals: Not progressing toward goals - comment (low BP)    Frequency    Min 2X/week      PT Plan      Co-evaluation              AM-PAC PT 6 Clicks Mobility   Outcome Measure  Help needed turning from your back to your side while in a flat bed without using bedrails?: A Little Help needed moving from lying on your back to sitting on the side of a flat bed without using bedrails?: A Little Help needed moving to and from a bed to a chair (including a wheelchair)?: A Little Help needed standing up from a chair using your arms (e.g., wheelchair or bedside chair)?: A Little Help needed to walk in hospital room?: A Lot Help needed climbing 3-5 steps with a railing? : Total 6 Click Score: 15    End of Session Equipment Utilized During Treatment: Gait belt Activity Tolerance: Treatment limited  secondary to medical complications (Comment) Patient left: in chair;with call bell/phone within reach;with chair alarm set Nurse Communication: Mobility status (orthostatic BP) PT Visit Diagnosis: Difficulty in walking, not elsewhere classified (R26.2);Muscle weakness (generalized) (M62.81)     Time: 8881-8859 PT Time Calculation (min) (ACUTE ONLY): 22 min  Charges:    $Therapeutic Activity: 8-22 mins PT General Charges $$ ACUTE PT VISIT: 1 Visit                     [name] PT Acute Rehabilitation Services Office [phone]    [name] 10/19/2023, 11:47 AM

## 2023-10-19 NOTE — TOC Progression Note (Addendum)
 Transition of Care [facility]) - Progression Note    Patient Details  Name: Tyler Scott MRN: [id number] Date of Birth: 11-07-1955  [facility] of [facility]) CM/SW Contact  Heather DELENA Saltness, LCSW Phone Number: 10/19/2023, 9:51 AM  Clinical Narrative:     ADDENDUM  12:18 PM - Insurance authorization for SNF at [facility] has been submitted to [facility] portal, currently pending.  11:23 AM - CSW received return phone call from [facility]  [phone] ext [phone], social worker at [facility], who reports pt's insurance authorization request for short-term SNF rehab has been declined. Roxie advised pt will need to utilize his [facility] [employer] for SNF rehab. CSW will submit insurance authorization for SNF rehab, once new PT note is completed.  CSW attempted to speak with Roxie Washington  [phone] ext [phone], social worker at [employer], regarding [employer] authorization for [facility]. No answer, voicemail left requesting return phone call. TOC will continue to follow.   Expected Discharge Plan: Home w Home Health Services Barriers to Discharge: Continued Medical Work up   Expected Discharge Plan and Services In-house Referral: Clinical Social Work Discharge Planning Services: NA Post Acute Care Choice: Home Health Living arrangements for the past 2 months: Single Family Home                 DME Arranged: N/A DME Agency: NA       HH Arranged: NA HH Agency: Other - See comment Producer, television/film/video) Date HH Agency Contacted: 10/11/23 Time HH Agency Contacted: 1422 Representative spoke with at [facility] Agency: Jon   Social Drivers of Health (SDOH) Interventions SDOH Screenings   Food Insecurity: No Food Insecurity (10/08/2023)  Housing: Low Risk  (10/08/2023)  Transportation Needs: No Transportation Needs (10/08/2023)  Utilities: Not At Risk (10/08/2023)  Depression (PHQ2-9): Low Risk  (09/14/2023)  Social Connections: Unknown (10/08/2023)  Recent Concern: Social  Connections - Moderately Isolated (10/08/2023)  Tobacco Use: Low Risk  (10/07/2023)    Readmission Risk Interventions    10/11/2023    2:19 PM 08/30/2023   11:40 AM 08/07/2023    8:49 AM  Readmission Risk Prevention Plan  Transportation Screening Complete Complete Complete  PCP or Specialist Appt within 5-7 Days   Complete  PCP or Specialist Appt within 3-5 Days  Complete   Home Care Screening   Complete  Medication Review (RN CM)   Complete  HRI or Home Care Consult  Complete   Social Work Consult for Recovery Care Planning/Counseling  Complete   Palliative Care Screening  Complete   Medication Review Oceanographer) Complete Complete   PCP or Specialist appointment within 3-5 days of discharge Complete    HRI or Home Care Consult Complete    SW Recovery Care/Counseling Consult Complete    Palliative Care Screening Not Applicable    Skilled Nursing Facility Not Applicable     Signed: Heather Saltness, MSW, LCSW Clinical Social Worker Inpatient Care Management 10/19/2023 9:52 AM

## 2023-10-19 NOTE — Plan of Care (Signed)
  Problem: Education: Goal: Ability to describe self-care measures that may prevent or decrease complications (Diabetes Survival Skills Education) will improve Outcome: Progressing   Problem: Coping: Goal: Ability to adjust to condition or change in health will improve Outcome: Progressing   Problem: Health Behavior/Discharge Planning: Goal: Ability to identify and utilize available resources and services will improve Outcome: Progressing   Problem: Metabolic: Goal: Ability to maintain appropriate glucose levels will improve Outcome: Progressing   Problem: Nutritional: Goal: Maintenance of adequate nutrition will improve Outcome: Progressing   Problem: Skin Integrity: Goal: Risk for impaired skin integrity will decrease Outcome: Progressing   Problem: Tissue Perfusion: Goal: Adequacy of tissue perfusion will improve Outcome: Progressing   Problem: Activity: Goal: Risk for activity intolerance will decrease Outcome: Progressing   Problem: Nutrition: Goal: Adequate nutrition will be maintained Outcome: Progressing   Problem: Elimination: Goal: Will not experience complications related to bowel motility Outcome: Progressing   Problem: Safety: Goal: Ability to remain free from injury will improve Outcome: Progressing   Problem: Skin Integrity: Goal: Risk for impaired skin integrity will decrease Outcome: Progressing

## 2023-10-20 ENCOUNTER — Inpatient Hospital Stay

## 2023-10-20 DIAGNOSIS — R579 Shock, unspecified: Secondary | ICD-10-CM | POA: Diagnosis not present

## 2023-10-20 LAB — CBC WITH DIFFERENTIAL/PLATELET
Abs Immature Granulocytes: 0.06 K/uL (ref 0.00–0.07)
Basophils Absolute: 0 K/uL (ref 0.0–0.1)
Basophils Relative: 1 %
Eosinophils Absolute: 0.1 K/uL (ref 0.0–0.5)
Eosinophils Relative: 2 %
HCT: 26 % — ABNORMAL LOW (ref 39.0–52.0)
Hemoglobin: 8 g/dL — ABNORMAL LOW (ref 13.0–17.0)
Immature Granulocytes: 1 %
Lymphocytes Relative: 76 %
Lymphs Abs: 3.5 K/uL (ref 0.7–4.0)
MCH: 28.8 pg (ref 26.0–34.0)
MCHC: 30.8 g/dL (ref 30.0–36.0)
MCV: 93.5 fL (ref 80.0–100.0)
Monocytes Absolute: 0.3 K/uL (ref 0.1–1.0)
Monocytes Relative: 7 %
Neutro Abs: 0.6 K/uL — ABNORMAL LOW (ref 1.7–7.7)
Neutrophils Relative %: 13 %
Platelets: 35 K/uL — ABNORMAL LOW (ref 150–400)
RBC: 2.78 MIL/uL — ABNORMAL LOW (ref 4.22–5.81)
RDW: 17.7 % — ABNORMAL HIGH (ref 11.5–15.5)
Smear Review: NORMAL
WBC: 4.6 K/uL (ref 4.0–10.5)
nRBC: 0.7 % — ABNORMAL HIGH (ref 0.0–0.2)

## 2023-10-20 LAB — GLUCOSE, CAPILLARY
Glucose-Capillary: 101 mg/dL — ABNORMAL HIGH (ref 70–99)
Glucose-Capillary: 168 mg/dL — ABNORMAL HIGH (ref 70–99)
Glucose-Capillary: 110 mg/dL — ABNORMAL HIGH (ref 70–99)
Glucose-Capillary: 157 mg/dL — ABNORMAL HIGH (ref 70–99)
Glucose-Capillary: 109 mg/dL — ABNORMAL HIGH (ref 70–99)
Glucose-Capillary: 63 mg/dL — ABNORMAL LOW (ref 70–99)

## 2023-10-20 NOTE — Progress Notes (Signed)
 Occupational Therapy Treatment Patient Details Name: Tyler Scott MRN: [id number] DOB: Aug 25, 1955 Today's Date: 10/20/2023   History of present illness Pt sent from Cancer center to ED 2* SOB, weakness and syncope with BP of 79/42 with fall off garage steps into car with spouse.  Pt admitted with severe anemia, thrombocytopenia and hypotension.  Pt is currently undergoing chemo for CLL and has his of DM with diabetic retinopathy.   OT comments  Patient seen for skilled OT this am. Noted decreased overall trunk and LE control and strength for functional mobility from bed to EOB to transfers. See below for details. Nursing made aware of increased assist need.  Vitals remained stable with OOB- refer to Activity tolerance section below and back pain an ongoing limitation. Patient requires continued Acute care hospital level OT services to progress safety and functional performance and allow for discharge. Patient will benefit from continued inpatient follow up therapy, <3 hours/day.         If plan is discharge home, recommend the following:  Two people to help with walking and/or transfers;A lot of help with bathing/dressing/bathroom;Assistance with cooking/housework;Direct supervision/assist for medications management;Direct supervision/assist for financial management;Assist for transportation;Help with stairs or ramp for entrance;Supervision due to cognitive status   Equipment Recommendations  Other (comment) (TBD post rehab)       Precautions / Restrictions Precautions Precautions: Fall Precaution/Restrictions Comments: orthostatics, near syncope Restrictions Weight Bearing Restrictions Per Provider Order: No       Mobility Bed Mobility Overal bed mobility: Needs Assistance Bed Mobility: Supine to Sit     Supine to sit: Min assist Sit to supine: Used rails, HOB elevated   General bed mobility comments: increased time and LE weakness noted    Transfers Overall transfer  level: Needs assistance Equipment used: Rolling walker (2 wheels) Transfers: Sit to/from Stand, Bed to chair/wheelchair/BSC Sit to Stand: Max assist, +2 physical assistance, +2 safety/equipment, From elevated surface (decline from previous noted with nursing made aware)          Lateral/Scoot Transfers: Max assist General transfer comment: unable to power up to stand this sesison without max +2 and RW, performed later scoot to recliner with nursing made aware of decline in transfer status     Balance Overall balance assessment: Needs assistance Sitting-balance support: Single extremity supported, Feet supported Sitting balance-Leahy Scale: Fair     Standing balance support: Bilateral upper extremity supported, During functional activity, Reliant on assistive device for balance Standing balance-Leahy Scale: Poor Standing balance comment: only able to maintain supported standing briefly this session                           ADL either performed or assessed with clinical judgement   ADL Overall ADL's : Needs assistance/impaired Eating/Feeding: Set up   Grooming: Wash/dry hands;Wash/dry face;Oral care;Set up;Sitting   Upper Body Bathing: Contact guard assist;Sitting   Lower Body Bathing: Moderate assistance;Maximal assistance;Sitting/lateral leans;Cueing for sequencing   Upper Body Dressing : Minimal assistance;Sitting   Lower Body Dressing: Maximal assistance;Sitting/lateral leans;Cueing for safety;Cueing for sequencing Lower Body Dressing Details (indicate cue type and reason): increased weakness B LE's and trunk this sesison needing more support Toilet Transfer: Maximal assistance;+2 for physical assistance;+2 for safety/equipment;Squat-pivot Toilet Transfer Details (indicate cue type and reason): unable to perform standing this session and nursing notified Toileting- Clothing Manipulation and Hygiene: Maximal assistance;Sitting/lateral lean       Functional  mobility during ADLs: Maximal assistance;+2 for physical  assistance;+2 for safety/equipment;Rolling walker (2 wheels) (decreased LE strength noted this session with nursing notified) General ADL Comments: LE and trunk weakness with increased ADL assist and transfer assist needs this visit    Extremity/Trunk Assessment Upper Extremity Assessment Upper Extremity Assessment: Generalized weakness;Right hand dominant   Lower Extremity Assessment Lower Extremity Assessment: Defer to PT evaluation        Vision   Vision Assessment?: No apparent visual deficits         Communication Communication Communication: No apparent difficulties   Cognition Arousal: Alert Behavior During Therapy: WFL for tasks assessed/performed, Flat affect Cognition: Cognition impaired   Orientation impairments: Situation Awareness: Intellectual awareness impaired, Online awareness impaired Memory impairment (select all impairments): Short-term memory Attention impairment (select first level of impairment): Selective attention Executive functioning impairment (select all impairments): Reasoning, Problem solving OT - Cognition Comments: intermittent delayed processing; poor insight as patient attempted commode alone and was found by OT laying sideways in bed at base not calling for assist                 Following commands: Intact        Cueing   Cueing Techniques: Verbal cues        General Comments orthostatics taken with no drop this session pre-132/68 post 128/63 deespite increased LE and trunk weakness    Pertinent Vitals/ Pain       Pain Assessment Pain Assessment: Faces Faces Pain Scale: Hurts even more Pain Location: back Pain Descriptors / Indicators: Aching, Sore Pain Intervention(s): Repositioned, Premedicated before session, Monitored during session, Limited activity within patient's tolerance (pdded recliner and added lumbar support roll)   Frequency  Min 2X/week         Progress Toward Goals  OT Goals(current goals can now be found in the care plan section)  Progress towards OT goals: Not progressing toward goals - comment (this session)  Acute Rehab OT Goals Patient Stated Goal: to be like I was the other day on the walker OT Goal Formulation: With patient Time For Goal Achievement: 10/25/23 Potential to Achieve Goals: Fair ADL Goals Pt Will Perform Lower Body Bathing: with contact guard assist;sit to/from stand Pt Will Perform Lower Body Dressing: with min assist;sit to/from stand Pt Will Transfer to Toilet: with contact guard assist;regular height toilet;grab bars;ambulating Pt Will Perform Toileting - Clothing Manipulation and hygiene: with contact guard assist;sit to/from stand Pt Will Perform Tub/Shower Transfer: Shower transfer;rolling walker;grab bars;shower seat Pt/caregiver will Perform Home Exercise Program: Increased strength;With written HEP provided;Both right and left upper extremity;Independently Additional ADL Goal #1: Patient will teach back 3/5 ECT's with BADL's and mobility with min cues  Plan         AM-PAC OT 6 Clicks Daily Activity     Outcome Measure   Help from another person eating meals?: A Little Help from another person taking care of personal grooming?: A Little Help from another person toileting, which includes using toliet, bedpan, or urinal?: A Lot Help from another person bathing (including washing, rinsing, drying)?: A Lot Help from another person to put on and taking off regular upper body clothing?: A Little Help from another person to put on and taking off regular lower body clothing?: A Lot 6 Click Score: 15    End of Session Equipment Utilized During Treatment: Gait belt;Rolling walker (2 wheels)  OT Visit Diagnosis: Unsteadiness on feet (R26.81);Muscle weakness (generalized) (M62.81);History of falling (Z91.81);Cognitive communication deficit (R41.841);Pain Pain - part of body:  (back)  Activity  Tolerance Patient limited by fatigue;Patient limited by pain   Patient Left in chair;with call bell/phone within reach;with chair alarm set;with nursing/sitter in room   Nurse Communication Mobility status;Other (comment) (need for increased assist with transfers and end vitals stable)        Time: 8858-8783 OT Time Calculation (min): 35 min  Charges: OT General Charges $OT Visit: 1 Visit OT Treatments $Therapeutic Activity: 23-37 mins  Torben Soloway OT/L Acute Rehabilitation Department  [phone]  10/20/2023, 1:09 PM

## 2023-10-20 NOTE — Progress Notes (Signed)
 Hypoglycemic Event  CBG: 62  Treatment: 4 oz juice/soda  Symptoms: None  Follow-up CBG: Time:2109 CBG Result:109  Possible Reasons for Event: Inadequate meal intake  Comments/MD notified: Lavanda Horns, NP    Kieran ONEIDA Number

## 2023-10-20 NOTE — Plan of Care (Signed)
  Problem: Education: Goal: Ability to describe self-care measures that may prevent or decrease complications (Diabetes Survival Skills Education) will improve Outcome: Progressing   Problem: Health Behavior/Discharge Planning: Goal: Ability to manage health-related needs will improve Outcome: Progressing   Problem: Coping: Goal: Ability to adjust to condition or change in health will improve Outcome: Not Progressing   Problem: Fluid Volume: Goal: Ability to maintain a balanced intake and output will improve Outcome: Not Progressing   Problem: Metabolic: Goal: Ability to maintain appropriate glucose levels will improve Outcome: Not Progressing   Problem: Nutritional: Goal: Maintenance of adequate nutrition will improve Outcome: Not Progressing

## 2023-10-20 NOTE — Progress Notes (Signed)
 Physical Therapy Treatment Patient Details Name: Tyler Scott MRN: [id number] DOB: 17-Aug-1955 Today's Date: 10/20/2023   History of Present Illness Pt sent from Cancer center to ED 2* SOB, weakness and syncope with BP of 79/42 with fall off garage steps into car with spouse.  Pt admitted with severe anemia, thrombocytopenia and hypotension.  Pt is currently undergoing chemo for CLL and has his of DM with diabetic retinopathy.    PT Comments   Pt admitted with above diagnosis.  Pt currently with functional limitations due to the deficits listed below (see PT Problem List). Pt seated in recliner when PT arrived. OT had assisted pt to recliner earlier in the day and reported a significant change in functional mobility and B LE weakness requiring extensive assist. Pt sacral sitting in recliner with pt sliding anteriorly and unable to reposition in recliner with cues requiring total A x 2, sitting balance unsupported in recliner and EOB with B UE support and progressing to no UE support with trunk flexion and unable to maintain due to fatigue and back pain, pt required max A x 2 for sit to stand  from recliner with RW, pt returned to recliner and set up for transfer to [facility] with pt needing to void bowels, pt required mod/max for squat pivot transfer to [facility], switch transfer to return to bed with max A x 2 for sit to stand  from [facility], max A to maintain standing balance with B UE support and OT removing BSC and placing bed behind pt with pt quickly fading to trunk, B knee and hip flexion with absent eccentric control to EOB, pt required mod A for sit to supine. Pt left in bed, all needs in place.  Patient will benefit from continued inpatient follow up therapy, <3 hours/day.  Pt will benefit from acute skilled PT to increase their independence and safety with mobility to allow discharge.      If plan is discharge home, recommend the following: A little help with walking and/or transfers;A little help with  bathing/dressing/bathroom;Assistance with cooking/housework;Assist for transportation;Help with stairs or ramp for entrance   Can travel by private vehicle     No  Equipment Recommendations  None recommended by PT    Recommendations for Other Services       Precautions / Restrictions Precautions Precautions: Fall Precaution/Restrictions Comments: orthostatics, near syncope Restrictions Weight Bearing Restrictions Per Provider Order: No     Mobility  Bed Mobility Overal bed mobility: Needs Assistance Bed Mobility: Sit to Supine     Supine to sit: Mod assist Sit to supine: Used rails, HOB elevated   General bed mobility comments: increased time, cues  and LE weakness noted    Transfers Overall transfer level: Needs assistance Equipment used: Rolling walker (2 wheels) Transfers: Sit to/from Stand, Bed to chair/wheelchair/BSC Sit to Stand: Max assist, +2 physical assistance, +2 safety/equipment, From elevated surface   Step pivot transfers: Mod assist, From elevated surface, +2 physical assistance, +2 safety/equipment, Max assist       General transfer comment: unable to power up to stand this sesison without max +2 and RW, needed commode use and squat pivot with OT making nursing made aware of decline in transfer status    Ambulation/Gait               General Gait Details: NT due to B LE instabiltiy and requried A x 2 for transfer tasks   Stairs  Wheelchair Mobility     Tilt Bed    Modified Rankin (Stroke Patients Only)       Balance Overall balance assessment: Needs assistance Sitting-balance support: Feet supported, Bilateral upper extremity supported Sitting balance-Leahy Scale: Fair Sitting balance - Comments: trunk flexion with pt able to maintain attention to midline with cues   Standing balance support: Bilateral upper extremity supported, During functional activity, Reliant on assistive device for balance Standing  balance-Leahy Scale: Zero Standing balance comment: pt required B UE support and max A to maintain static standing breifly with pt quickly fading to B Knee, hip and trunk flexion                            Communication Communication Communication: No apparent difficulties  Cognition Arousal: Alert Behavior During Therapy: WFL for tasks assessed/performed, Flat affect   PT - Cognitive impairments: No apparent impairments                         Following commands: Intact      Cueing Cueing Techniques: Verbal cues  Exercises      General Comments General comments (skin integrity, edema, etc.): Bp 100/57 (115 PR) and O2 99% on Ra, unable to obtain Bp in standing, no reports of dizziness this session      Pertinent Vitals/Pain Pain Assessment Pain Assessment: Faces Faces Pain Scale: Hurts whole lot Pain Location: back Pain Descriptors / Indicators: Aching, Sore Pain Intervention(s): Limited activity within patient's tolerance, Monitored during session, Premedicated before session, Repositioned    Home Living                          Prior Function            PT Goals (current goals can now be found in the care plan section) Acute Rehab PT Goals Patient Stated Goal: REgain iND PT Goal Formulation: With patient Time For Goal Achievement: 10/24/23 Potential to Achieve Goals: Fair Progress towards PT goals: Not progressing toward goals - comment (decline in mobiltiy noted)    Frequency    Min 2X/week      PT Plan      Co-evaluation PT/OT/SLP Co-Evaluation/Treatment: Yes (poor activitiy tolerance) Reason for Co-Treatment: Complexity of the patient's impairments (multi-system involvement);Necessary to address cognition/behavior during functional activity;For patient/therapist safety PT goals addressed during session: Mobility/safety with mobility;Balance;Proper use of DME OT goals addressed during session: ADL's and self-care;Proper  use of Adaptive equipment and DME      AM-PAC PT 6 Clicks Mobility   Outcome Measure  Help needed turning from your back to your side while in a flat bed without using bedrails?: A Little Help needed moving from lying on your back to sitting on the side of a flat bed without using bedrails?: A Lot Help needed moving to and from a bed to a chair (including a wheelchair)?: A Lot Help needed standing up from a chair using your arms (e.g., wheelchair or bedside chair)?: A Lot Help needed to walk in hospital room?: A Lot Help needed climbing 3-5 steps with a railing? : Total 6 Click Score: 12    End of Session Equipment Utilized During Treatment: Gait belt Activity Tolerance: Treatment limited secondary to medical complications (Comment) Patient left: in chair;with call bell/phone within reach;with chair alarm set Nurse Communication: Mobility status (orthostatic BP) PT Visit Diagnosis: Difficulty in walking, not elsewhere classified (  R26.2);Muscle weakness (generalized) (M62.81)     Time: 8586-8560 PT Time Calculation (min) (ACUTE ONLY): 26 min  Charges:    $Therapeutic Activity: 8-22 mins PT General Charges $$ ACUTE PT VISIT: 1 Visit                     [place], PT Acute Rehab    [place] VEAR Drone 10/20/2023, 7:32 PM

## 2023-10-20 NOTE — Plan of Care (Signed)

## 2023-10-20 NOTE — Progress Notes (Signed)
 PROGRESS NOTE    Tyler Scott  FMW:[id number] DOB: [birth year] DOA: 10/07/2023 PCP: [facility], [facility]    Brief Narrative:   Tyler Scott is a 68 y.o. male with past medical history significant for CLL with recent initiation of chemotherapy, HTN, HLD, DM 1, chronic ITP (baseline Plt 40's) who presented to [facility] ED from the cancer center with complaints of abdominal pain, nausea, poor oral intake, generalized weakness with near syncope and shortness of breath.  Patient was due for next chemotherapy infusion today, 8/28 and when attempting to get to the car had an episode of near syncope with associated shortness of breath and generalized weakness.  EMS was activated in which she was noted to be tachycardic, hypotensive with mild AMS (improved shortly after arrival).  Patient's wife also noted hyperglycemia to the 300s the morning of 10/07/2023 in which she administered insulin .  On ED arrival, patient was afebrile with HR 116, BP 95/67, RR 25, SpO2 94%. Labs were notable for WBC 14.6 (down from 17.1 8/27 with ANC 1100), Hgb 1.7 (8.2), Plt 10 (47). INR > 10 (1 two months PTA). CMP/lipase pending. Trop 46. LA 2.5 > 5.4. VBG 7.21/pCO2 < 18/pO2 < 31/bicarb 5.6. Initial CBG 40. CXR demonstrated small L pleural effusion and L lung base atelectasis with R paratracheal adenoapthyp; CT A/P showed extensive bulky LAD throughout the lower chest, abdomen, pelvis and groin regions (similar from prior), splenic enlargement with peripheral splenic infarct, small lucent lesions in the pelvis c/f lymphomatous involvement, moderate L and small R pleural effusions with atelectasis. Broad-spectrum antibiotics were started, Cx pending. Levophed  started for hypotension. Oncology consulted. PCCM consulted for ICU admission.  Significant hospital events: 8/28: Admit ICU, started on vasopressors, oncology consulted; transfused 2 unit PRBC 8/29: Levophed  titrated off; received rasburicase , 1 unit platelets, 1  unit PRBC transfusion 8/30: 1 unit platelet transfusion 8/31: Transferred to [facility]; 2 unit platelet transfusion 9/1: 1u platelet transfusion, transfer to 6 E. 9/2: transfuse 1u Plt, to receive NPLATE  per oncology  9/8: Received second dose of Nplate  9/10: Platelet count stable, 35 awaiting insurance authorization for SNF   Assessment & Plan:   Shock, undifferentiated Concern for sepsis, ruled out Patient presenting with weakness and noted to be hypotensive, initially admitted to the intensive care unit on vasopressors.  Suspect etiology likely secondary to severe dehydration, poor oral intake with recent chemotherapy infusion as well as tumor lysis syndrome as above.  Initially was started on empiric antibiotics with vancomycin , cefepime  and doxycycline  which were stopped.  Acute hypoxemic respiratory failure: Resolved Supplemental oxygen weaned off.  CLL/SLL/low-grade lymphoma Tumor lysis syndrome Patient initially diagnosed with CLL 2012. Most recently patient received Cycle 1 Bendamustine  (without Rituxan ) on 09/08/23.  Imaging 8/29 with extensive bulky lymphadenopathy throughout the lower chest, abdomen, pelvis and groin similar to previous study also with splenic enlargement, noted bone lesions progressive since prior study.  Uric acid 12.3, received rasburicase  on 8/28. -- Uric acid 12.3>5.1..<1.5 -- Further per oncology  Acute renal failure: Resolved Suspect etiology multifactorial with ATN from hypotension/shock as well as prerenal azotemia from severe dehydration.  No hydronephrosis noted on CT abdomen/pelvis at time of admission.  IV fluids now discontinued. -- Cr 2.48>2.36>>1.58>0.92>0.76>>0.79  Anemia, severe Hemoglobin on arrival 1.7; transfused 3 unit PRBCs. -- Hgb 1.7>9.3>10.0>8.7>9.8 -- Transfuse for hemoglobin less than 7.0 -- CBC daily  Thrombocytopenia, severe Platelet count 10K arrival.  Received 1 unit platelet on 8/29, 8/30, 2 u on 8/31, 1u 9/1, 1u 9/2.  Received  Nplate  on 9/2 and 9/8 -- Plt 10>15>18>>14>18>15>27>18>>21>26>34>>35 -- Oncology plans weekly Nplate  and PRBC transfusion support starting 9/9; plan to start Venetoclax  in 2 weeks  -- Outpatient follow-up with Dr. Onesimo  Hypokalemia Hypophosphatemia Hypomagnesia Mia Repleted.    HTN Patient was notably hypotensive/shock as above at time of arrival. -- BP 132/77, stable -- Continue monitor BP closely  HLD -- Zocor  20 mg p.o. daily  Type 1 diabetes mellitus On Jardiance  12.5 mg p.o. daily, Lantus  10-30 units  Prn at bedtime at baseline -- Sensitive SSI for coverage  Chronic pain Patient reportedly on MS Contin  15 mg p.o. nightly, gabapentin  100 mg p.o. 3 times daily Dilaudid  2 mg p.o. nightly Robaxin  500 mg p.o. nightly -- Hold home MS Contin  -- Continue gabapentin  100 mg PO TID -- Continue dialudid 2 mg PO at bedtime -- Continue Robaxin  500 mg PO at bedtime  Depression Seen by psychiatry on 9/5.  Started on Zoloft .  Continue doxepin  25 mg p.o. nightly.  Weakness/debility/deconditioning/gait disturbance: -- PT/OT recommending SNF, pending insurance authorization.  Currently recommending home health   DVT prophylaxis: SCDs Start: 10/07/23 1835    Code Status: Full Code Family Communication: No family present bedside this morning  Disposition Plan:  Level of care: Telemetry Status is: Inpatient Remains inpatient appropriate because: Platelet transfusion, Nplate  infusion, may need SNF placement    Consultants:  PCCM - singed off 8/31 Medical Oncology Psychiatry  Procedures:  none  Antimicrobials:  Cefepime  8/28 - 8/28 Vancomycin  8/28 - 8/28 Doxycycline  8/28 - 8/30   Subjective: Patient seen examined bedside, lying in bed.  No complaints this morning.  Platelet count stable, 35 this morning.  Awaiting insurance authorization for SNF placement.  No other questions, concerns or complaints at this time.  Denies headache, no dizziness, no chest pain, no  palpitations, no shortness of breath, no abdominal pain, no fever/chills/night sweats, no nausea/vomiting.  No acute concerns overnight per nursing staff.  Objective: Vitals:   10/20/23 0400 10/20/23 0419 10/20/23 0707 10/20/23 1239  BP:  (!) 142/67  128/63  Pulse:  99  (!) 101  Resp: (!) 24 18  16   Temp:  97.7 F (36.5 C)  97.7 F (36.5 C)  TempSrc:  Oral  Oral  SpO2:  99%  98%  Weight:   56.8 kg   Height:        Intake/Output Summary (Last 24 hours) at 10/20/2023 1735 Last data filed at 10/20/2023 1348 Gross per 24 hour  Intake 720 ml  Output 450 ml  Net 270 ml   Filed Weights   10/18/23 0500 10/19/23 0500 10/20/23 0707  Weight: 57.9 kg 56.4 kg 56.8 kg    Examination:  Physical Exam: GEN: NAD, alert and oriented x 3 HEENT: NCAT, PERRL, EOMI, sclera clear, MMM PULM: CTAB w/o wheezes/crackles, normal respiratory effort, room air CV: RRR w/o M/G/R GI: abd soft, NTND, + BS MSK: no peripheral edema, moves all extremity independently NEURO: No focal neurological deficit PSYCH: normal mood/affect Integumentary: No concerning rashes/lesions/wounds noted on exposed skin surfaces    Data Reviewed: I have personally reviewed following labs and imaging studies  CBC: Recent Labs  Lab 10/16/23 0744 10/17/23 0629 10/18/23 0510 10/19/23 0539 10/20/23 0550  WBC 5.7 6.0 6.5 5.2 4.6  NEUTROABS 1.0* 1.0* 1.0* 0.8* 0.6*  HGB 8.4* 8.3* 8.9* 8.7* 8.0*  HCT 26.9* 26.8* 27.5* 27.1* 26.0*  MCV 93.1 94.0 92.9 92.8 93.5  PLT 21* 26* 34* 33* 35*   Basic Metabolic Panel: Recent  Labs  Lab 10/14/23 0532 10/15/23 0526 10/16/23 0744 10/17/23 0629 10/19/23 0539  NA 138 140 140 137 135  K 3.8 3.5 4.1 4.2 4.1  CL 104 105 105 104 102  CO2 24 25 24 22 23   GLUCOSE 81 89 153* 96 114*  BUN 8 10 11 10 11   CREATININE 0.68 0.74 0.80 0.73 0.79  CALCIUM  7.8* 8.1* 8.0* 8.0* 8.3*  MG 2.1 1.9 1.8 1.8 1.7  PHOS 2.2* 3.0 3.2 3.2 3.3   GFR: Estimated Creatinine Clearance: 72 mL/min (by  C-G formula based on SCr of 0.79 mg/dL). Liver Function Tests: Recent Labs  Lab 10/14/23 0532 10/15/23 0526 10/16/23 0744 10/17/23 0629  AST 20 21 25 28   ALT 9 10 12 12   ALKPHOS 88 87 89 87  BILITOT 1.1 1.1 1.1 1.1  PROT 4.6* 4.5* 4.6* 4.6*  ALBUMIN 3.0* 3.1* 3.2* 3.1*   No results for input(s): LIPASE, AMYLASE in the last 168 hours.  No results for input(s): AMMONIA in the last 168 hours. Coagulation Profile: No results for input(s): INR, PROTIME in the last 168 hours.  Cardiac Enzymes: No results for input(s): CKTOTAL, CKMB, CKMBINDEX, TROPONINI in the last 168 hours. BNP (last 3 results) No results for input(s): PROBNP in the last 8760 hours. HbA1C: No results for input(s): HGBA1C in the last 72 hours. CBG: Recent Labs  Lab 10/19/23 2352 10/20/23 0422 10/20/23 0707 10/20/23 1132 10/20/23 1625  GLUCAP 103* 101* 168* 110* 157*   Lipid Profile: No results for input(s): CHOL, HDL, LDLCALC, TRIG, CHOLHDL, LDLDIRECT in the last 72 hours. Thyroid  Function Tests: No results for input(s): TSH, T4TOTAL, FREET4, T3FREE, THYROIDAB in the last 72 hours. Anemia Panel: No results for input(s): VITAMINB12, FOLATE, FERRITIN, TIBC, IRON, RETICCTPCT in the last 72 hours. Sepsis Labs: No results for input(s): PROCALCITON, LATICACIDVEN in the last 168 hours.   No results found for this or any previous visit (from the past 240 hours).        Radiology Studies: No results found.      Scheduled Meds:  sodium chloride    Intravenous Once   acyclovir   400 mg Oral Daily   allopurinol   150 mg Oral Daily   Chlorhexidine  Gluconate Cloth  6 each Topical Daily   doxepin   25 mg Oral QHS   feeding supplement  237 mL Oral BID BM   gabapentin   100 mg Oral TID   HYDROmorphone   2 mg Oral QHS   insulin  aspart  0-9 Units Subcutaneous Q4H   methocarbamol   500 mg Oral QHS   pantoprazole   40 mg Oral QHS   sertraline   25 mg  Oral Daily   simvastatin   20 mg Oral q1800   Continuous Infusions:   LOS: 13 days    Time spent: 46 minutes spent on 10/20/2023 caring for this patient face-to-face including chart review, ordering labs/tests, documenting, discussion with nursing staff, consultants, updating family and interview/physical exam    Camellia PARAS [place], DO [facility] Available via Epic secure chat 7am-7pm After these hours, please refer to coverage provider listed on [email or web address] 10/20/2023, 5:35 PM

## 2023-10-20 NOTE — Progress Notes (Signed)
 Occupational Therapy Treatment Patient Details Name: Tyler Scott MRN: [id number] DOB: Sep 21, 1955 Today's Date: 10/20/2023   History of present illness Pt sent from Cancer center to ED 2* SOB, weakness and syncope with BP of 79/42 with fall off garage steps into car with spouse.  Pt admitted with severe anemia, thrombocytopenia and hypotension.  Pt is currently undergoing chemo for CLL and has his of DM with diabetic retinopathy.   OT comments  Patient seen for 2nd session this day due to increased difficulty with mobility for ADL's and transfers. Patient up in reliner and post lunch meal was able to perform STS from recliner with + 2 assist but unable to maintain stability for SPT. Needed commode use for BM and transferred for use then back to bed. See below for current status. Patient requires continued Acute care hospital level OT services to progress safety and functional performance and allow for discharge. Patient will benefit from continued inpatient follow up therapy, <3 hours/day.         If plan is discharge home, recommend the following:  Two people to help with walking and/or transfers;A lot of help with bathing/dressing/bathroom;Assistance with cooking/housework;Direct supervision/assist for medications management;Direct supervision/assist for financial management;Assist for transportation;Help with stairs or ramp for entrance;Supervision due to cognitive status   Equipment Recommendations  Other (comment) (TBD post rehab)       Precautions / Restrictions Precautions Precautions: Fall Precaution/Restrictions Comments: orthostatics, near syncope Restrictions Weight Bearing Restrictions Per Provider Order: No       Mobility Bed Mobility Overal bed mobility: Needs Assistance Bed Mobility: Sit to Supine     Supine to sit: Mod assist Sit to supine: Used rails, HOB elevated   General bed mobility comments: increased time and LE weakness noted     Transfers Overall transfer level: Needs assistance Equipment used: Rolling walker (2 wheels) Transfers: Sit to/from Stand, Bed to chair/wheelchair/BSC Sit to Stand: Max assist, +2 physical assistance, +2 safety/equipment, From elevated surface (decline from previous noted with nursing made aware)     Step pivot transfers: Mod assist, From elevated surface, +2 physical assistance, +2 safety/equipment, Max assist    Lateral/Scoot Transfers: Max assist General transfer comment: unable to power up to stand this sesison without max +2 and RW, needed commode use and squat pivot with nursing made aware of decline in transfer status     Balance Overall balance assessment: Needs assistance Sitting-balance support: Single extremity supported, Feet supported Sitting balance-Leahy Scale: Fair     Standing balance support: Bilateral upper extremity supported, During functional activity, Reliant on assistive device for balance Standing balance-Leahy Scale: Poor Standing balance comment: only able to maintain supported standing briefly this 2nd session                           ADL either performed or assessed with clinical judgement   ADL Overall ADL's : Needs assistance/impaired Eating/Feeding: Set up                       Toilet Transfer: Maximal assistance;+2 for physical assistance;+2 for safety/equipment;Squat-pivot;Moderate assistance Toilet Transfer Details (indicate cue type and reason): trial with SPT but only able to stand with + support, squat pivot to commode for BM Toileting- Clothing Manipulation and Hygiene: Maximal assistance;Sitting/lateral lean Toileting - Clothing Manipulation Details (indicate cue type and reason): post BM     Functional mobility during ADLs: Maximal assistance;+2 for physical assistance;+2 for safety/equipment;Rolling walker (2  wheels);Moderate assistance (2nd session with trials from recliner with +2 up to RW then squat pivot to  commode) General ADL Comments: LE and trunk weakness with increased ADL assist and transfer assist needs this visit    Extremity/Trunk Assessment Upper Extremity Assessment Upper Extremity Assessment: Generalized weakness   Lower Extremity Assessment Lower Extremity Assessment: Defer to PT evaluation        Vision   Vision Assessment?: No apparent visual deficits         Communication Communication Communication: No apparent difficulties   Cognition Arousal: Alert Behavior During Therapy: WFL for tasks assessed/performed, Flat affect Cognition: Cognition impaired   Orientation impairments: Situation Awareness: Intellectual awareness impaired, Online awareness impaired Memory impairment (select all impairments): Short-term memory Attention impairment (select first level of impairment): Selective attention Executive functioning impairment (select all impairments): Reasoning, Problem solving OT - Cognition Comments: intermittent delayed processing; poor insight as patient attempted commode alone and was found by OT laying sideways in bed at base not calling for assist                 Following commands: Intact        Cueing   Cueing Techniques: Verbal cues        General Comments BP 110/68 on commode, transferred back into bed due to fatigue and pain    Pertinent Vitals/ Pain       Pain Assessment Pain Assessment: Faces Faces Pain Scale: Hurts whole lot Pain Location: back Pain Descriptors / Indicators: Aching, Sore Pain Intervention(s): Monitored during session, Premedicated before session, Repositioned   Frequency  Min 2X/week        Progress Toward Goals  OT Goals(current goals can now be found in the care plan section)  Progress towards OT goals: Progressing toward goals (did perform STS this sesison with +2 assist)  Acute Rehab OT Goals OT Goal Formulation: With patient Time For Goal Achievement: 10/25/23 Potential to Achieve Goals: Fair ADL  Goals Pt Will Perform Lower Body Bathing: with contact guard assist;sit to/from stand Pt Will Perform Lower Body Dressing: with min assist;sit to/from stand Pt Will Transfer to Toilet: with contact guard assist;regular height toilet;grab bars;ambulating Pt Will Perform Toileting - Clothing Manipulation and hygiene: with contact guard assist;sit to/from stand Pt Will Perform Tub/Shower Transfer: Shower transfer;rolling walker;grab bars;shower seat Pt/caregiver will Perform Home Exercise Program: Increased strength;With written HEP provided;Both right and left upper extremity;Independently Additional ADL Goal #1: Patient will teach back 3/5 ECT's with BADL's and mobility with min cues  Plan         AM-PAC OT 6 Clicks Daily Activity     Outcome Measure   Help from another person eating meals?: A Little Help from another person taking care of personal grooming?: A Little Help from another person toileting, which includes using toliet, bedpan, or urinal?: A Lot Help from another person bathing (including washing, rinsing, drying)?: A Lot Help from another person to put on and taking off regular upper body clothing?: A Little Help from another person to put on and taking off regular lower body clothing?: A Lot 6 Click Score: 15    End of Session Equipment Utilized During Treatment: Gait belt;Rolling walker (2 wheels)  OT Visit Diagnosis: Unsteadiness on feet (R26.81);Muscle weakness (generalized) (M62.81);History of falling (Z91.81);Cognitive communication deficit (R41.841);Pain Pain - part of body:  (back)   Activity Tolerance Patient limited by fatigue;Patient limited by pain   Patient Left in bed;with bed alarm set;Other (comment) (BM on commode and need  for sheet clean up)   Nurse Communication Mobility status;Other (comment) (need for increased assist with transfers and end vitals stable)        Time: 8592-8559 OT Time Calculation (min): 33 min  Charges: OT General  Charges $OT Visit: 1 Visit OT Treatments $Self Care/Home Management : 8-22 mins  Maury Bamba OT/L Acute Rehabilitation Department  [phone]  10/20/2023, 4:16 PM

## 2023-10-20 NOTE — TOC Progression Note (Signed)
 Transition of Care [facility] - Progression Note    Patient Details  Name: [name] MRN: [id number] Date of Birth: April 16, 1955  Transition of Care [facility]) CM/SW Contact  Sheri ONEIDA [place], [place] Phone Number: 10/20/2023, 3:55 PM  Clinical Narrative:    Ins auth still pending. IPCM to f/u tomorrow.   Expected Discharge Plan: Home w Home Health Services Barriers to Discharge: Continued Medical Work up               Expected Discharge Plan and Services In-house Referral: Clinical Social Work Discharge Planning Services: NA Post Acute Care Choice: Home Health Living arrangements for the past 2 months: Single Family Home                 DME Arranged: N/A DME Agency: NA       HH Arranged: NA HH Agency: Other - See comment Producer, television/film/video) Date HH Agency Contacted: 10/11/23 Time HH Agency Contacted: 1422 Representative spoke with at [facility] Agency: Jon   Social Drivers of Health (SDOH) Interventions SDOH Screenings   Food Insecurity: No Food Insecurity (10/08/2023)  Housing: Low Risk  (10/08/2023)  Transportation Needs: No Transportation Needs (10/08/2023)  Utilities: Not At Risk (10/08/2023)  Depression (PHQ2-9): Low Risk  (09/14/2023)  Social Connections: Unknown (10/08/2023)  Recent Concern: Social Connections - Moderately Isolated (10/08/2023)  Tobacco Use: Low Risk  (10/07/2023)    Readmission Risk Interventions    10/11/2023    2:19 PM 08/30/2023   11:40 AM 08/07/2023    8:49 AM  Readmission Risk Prevention Plan  Transportation Screening Complete Complete Complete  PCP or Specialist Appt within 5-7 Days   Complete  PCP or Specialist Appt within 3-5 Days  Complete   Home Care Screening   Complete  Medication Review (RN CM)   Complete  HRI or Home Care Consult  Complete   Social Work Consult for Recovery Care Planning/Counseling  Complete   Palliative Care Screening  Complete   Medication Review Oceanographer) Complete Complete   PCP or Specialist appointment within  3-5 days of discharge Complete    HRI or Home Care Consult Complete    SW Recovery Care/Counseling Consult Complete    Palliative Care Screening Not Applicable    Skilled Nursing Facility Not Applicable

## 2023-10-20 NOTE — Progress Notes (Signed)
 No acute events overnight, patient denied N/V, pain managed with previous prn for pain recd on the previous shift , in bed resting, call light in reach

## 2023-10-21 ENCOUNTER — Ambulatory Visit: Admitting: Internal Medicine

## 2023-10-21 DIAGNOSIS — R579 Shock, unspecified: Secondary | ICD-10-CM | POA: Diagnosis not present

## 2023-10-21 LAB — GLUCOSE, CAPILLARY
Glucose-Capillary: 100 mg/dL — ABNORMAL HIGH (ref 70–99)
Glucose-Capillary: 106 mg/dL — ABNORMAL HIGH (ref 70–99)
Glucose-Capillary: 110 mg/dL — ABNORMAL HIGH (ref 70–99)
Glucose-Capillary: 122 mg/dL — ABNORMAL HIGH (ref 70–99)
Glucose-Capillary: 126 mg/dL — ABNORMAL HIGH (ref 70–99)
Glucose-Capillary: 134 mg/dL — ABNORMAL HIGH (ref 70–99)

## 2023-10-21 LAB — CBC WITH DIFFERENTIAL/PLATELET
Abs Immature Granulocytes: 0.03 K/uL (ref 0.00–0.07)
Basophils Absolute: 0 K/uL (ref 0.0–0.1)
Basophils Relative: 1 %
Eosinophils Absolute: 0.1 K/uL (ref 0.0–0.5)
Eosinophils Relative: 1 %
HCT: 25.2 % — ABNORMAL LOW (ref 39.0–52.0)
Hemoglobin: 8 g/dL — ABNORMAL LOW (ref 13.0–17.0)
Immature Granulocytes: 1 %
Lymphocytes Relative: 77 %
Lymphs Abs: 4 K/uL (ref 0.7–4.0)
MCH: 29.4 pg (ref 26.0–34.0)
MCHC: 31.7 g/dL (ref 30.0–36.0)
MCV: 92.6 fL (ref 80.0–100.0)
Monocytes Absolute: 0.4 K/uL (ref 0.1–1.0)
Monocytes Relative: 8 %
Neutro Abs: 0.6 K/uL — ABNORMAL LOW (ref 1.7–7.7)
Neutrophils Relative %: 12 %
Platelets: 42 K/uL — ABNORMAL LOW (ref 150–400)
RBC: 2.72 MIL/uL — ABNORMAL LOW (ref 4.22–5.81)
RDW: 17.7 % — ABNORMAL HIGH (ref 11.5–15.5)
Smear Review: NORMAL
WBC: 5.1 K/uL (ref 4.0–10.5)
nRBC: 0.6 % — ABNORMAL HIGH (ref 0.0–0.2)

## 2023-10-21 MED ORDER — INSULIN ASPART 100 UNIT/ML IJ SOLN
0.0000 [IU] | Freq: Three times a day (TID) | INTRAMUSCULAR | Status: DC
  Administered 2023-10-22 – 2023-10-23 (×4): 1 [IU] via SUBCUTANEOUS

## 2023-10-21 NOTE — TOC Progression Note (Addendum)
 Transition of Care [facility]) - Progression Note    Patient Details  Name: Tyler Scott MRN: [id number] Date of Birth: 1955-04-08  Transition of Care [facility]) CM/SW Contact  Doneta Glenys DASEN, RN Phone Number: 10/21/2023, 11:28 AM  Clinical Narrative:    CM called April [phone] ext [phone] left message.  CM called Bernarda Jump [phone] ext. [phone] left message CM called Penny[facility]rn Whitney [phone] and [phone] left message. Penny[employer]rn returned call auth denied with [facility] Medicare and [employer]. 12:03 PM CM called to ask for reason of deny. [employer] say due to patient had not completed his acute care (patient was still hypotensive). CM has completed a new auth to [facility]. Shara ID [id number] 3:54 PM CM called an spoke to patients wife Neletta, updated on insurance denied and reason. Also informed her that I have resubmitted auth.  Expected Discharge Plan: Home w Home Health Services Barriers to Discharge: Continued Medical Work up               Expected Discharge Plan and Services In-house Referral: Clinical Social Work Discharge Planning Services: NA Post Acute Care Choice: Home Health Living arrangements for the past 2 months: Single Family Home                 DME Arranged: N/A DME Agency: NA       HH Arranged: NA HH Agency: Other - See comment Producer, television/film/video) Date HH Agency Contacted: 10/11/23 Time HH Agency Contacted: 1422 Representative spoke with at [facility] Agency: Jon   Social Drivers of Health (SDOH) Interventions SDOH Screenings   Food Insecurity: No Food Insecurity (10/08/2023)  Housing: Low Risk  (10/08/2023)  Transportation Needs: No Transportation Needs (10/08/2023)  Utilities: Not At Risk (10/08/2023)  Depression (PHQ2-9): Low Risk  (09/14/2023)  Social Connections: Unknown (10/08/2023)  Recent Concern: Social Connections - Moderately Isolated (10/08/2023)  Tobacco Use: Low Risk  (10/07/2023)    Readmission Risk Interventions    10/11/2023    2:19 PM 08/30/2023    11:40 AM 08/07/2023    8:49 AM  Readmission Risk Prevention Plan  Transportation Screening Complete Complete Complete  PCP or Specialist Appt within 5-7 Days   Complete  PCP or Specialist Appt within 3-5 Days  Complete   Home Care Screening   Complete  Medication Review (RN CM)   Complete  HRI or Home Care Consult  Complete   Social Work Consult for Recovery Care Planning/Counseling  Complete   Palliative Care Screening  Complete   Medication Review Oceanographer) Complete Complete   PCP or Specialist appointment within 3-5 days of discharge Complete    HRI or Home Care Consult Complete    SW Recovery Care/Counseling Consult Complete    Palliative Care Screening Not Applicable    Skilled Nursing Facility Not Applicable

## 2023-10-21 NOTE — Plan of Care (Signed)
  Problem: Coping: Goal: Ability to adjust to condition or change in health will improve Outcome: Progressing   Problem: Fluid Volume: Goal: Ability to maintain a balanced intake and output will improve Outcome: Progressing   Problem: Nutritional: Goal: Maintenance of adequate nutrition will improve Outcome: Progressing   Problem: Skin Integrity: Goal: Risk for impaired skin integrity will decrease Outcome: Progressing   Problem: Education: Goal: Knowledge of General Education information will improve Description: Including pain rating scale, medication(s)/side effects and non-pharmacologic comfort measures Outcome: Progressing   Problem: Health Behavior/Discharge Planning: Goal: Ability to manage health-related needs will improve Outcome: Progressing   Problem: Clinical Measurements: Goal: Will remain free from infection Outcome: Progressing

## 2023-10-21 NOTE — Plan of Care (Signed)
  Problem: Skin Integrity: Goal: Risk for impaired skin integrity will decrease Outcome: Progressing   Problem: Education: Goal: Ability to describe self-care measures that may prevent or decrease complications (Diabetes Survival Skills Education) will improve Outcome: Not Progressing Goal: Individualized Educational Video(s) Outcome: Not Progressing   Problem: Coping: Goal: Ability to adjust to condition or change in health will improve Outcome: Not Progressing   Problem: Fluid Volume: Goal: Ability to maintain a balanced intake and output will improve Outcome: Not Progressing   Problem: Health Behavior/Discharge Planning: Goal: Ability to identify and utilize available resources and services will improve Outcome: Not Progressing Goal: Ability to manage health-related needs will improve Outcome: Not Progressing   Problem: Metabolic: Goal: Ability to maintain appropriate glucose levels will improve Outcome: Not Progressing   Problem: Nutritional: Goal: Maintenance of adequate nutrition will improve Outcome: Not Progressing

## 2023-10-21 NOTE — Progress Notes (Signed)
 PROGRESS NOTE    [name]  FMW:[id number] DOB: 02-13-1955 DOA: 10/07/2023 PCP: [facility]    Brief Narrative:   Tyler Scott is a 68 y.o. male with past medical history significant for CLL with recent initiation of chemotherapy, HTN, HLD, DM 1, chronic ITP (baseline Plt 40's) who presented to [facility] ED from the cancer center with complaints of abdominal pain, nausea, poor oral intake, generalized weakness with near syncope and shortness of breath.  Patient was due for next chemotherapy infusion today, 8/28 and when attempting to get to the car had an episode of near syncope with associated shortness of breath and generalized weakness.  EMS was activated in which she was noted to be tachycardic, hypotensive with mild AMS (improved shortly after arrival).  Patient's wife also noted hyperglycemia to the 300s the morning of 10/07/2023 in which she administered insulin .  On ED arrival, patient was afebrile with HR 116, BP 95/67, RR 25, SpO2 94%. Labs were notable for WBC 14.6 (down from 17.1 8/27 with ANC 1100), Hgb 1.7 (8.2), Plt 10 (47). INR > 10 (1 two months PTA). CMP/lipase pending. Trop 46. LA 2.5 > 5.4. VBG 7.21/pCO2 < 18/pO2 < 31/bicarb 5.6. Initial CBG 40. CXR demonstrated small L pleural effusion and L lung base atelectasis with R paratracheal adenoapthyp; CT A/P showed extensive bulky LAD throughout the lower chest, abdomen, pelvis and groin regions (similar from prior), splenic enlargement with peripheral splenic infarct, small lucent lesions in the pelvis c/f lymphomatous involvement, moderate L and small R pleural effusions with atelectasis. Broad-spectrum antibiotics were started, Cx pending. Levophed  started for hypotension. Oncology consulted. PCCM consulted for ICU admission.  Significant hospital events: 8/28: Admit ICU, started on vasopressors, oncology consulted; transfused 2 unit PRBC 8/29: Levophed  titrated off; received rasburicase , 1 unit platelets, 1  unit PRBC transfusion 8/30: 1 unit platelet transfusion 8/31: Transferred to [facility]; 2 unit platelet transfusion 9/1: 1u platelet transfusion, transfer to 6 E. 9/2: transfuse 1u Plt, to receive NPLATE  per oncology  9/8: Received second dose of Nplate  9/11: Platelet count stable, 42 awaiting insurance authorization for SNF   Assessment & Plan:   Shock, undifferentiated Concern for sepsis, ruled out Patient presenting with weakness and noted to be hypotensive, initially admitted to the intensive care unit on vasopressors.  Suspect etiology likely secondary to severe dehydration, poor oral intake with recent chemotherapy infusion as well as tumor lysis syndrome as above.  Initially was started on empiric antibiotics with vancomycin , cefepime  and doxycycline  which were stopped.  Acute hypoxemic respiratory failure: Resolved Supplemental oxygen weaned off.  CLL/SLL/low-grade lymphoma Tumor lysis syndrome Patient initially diagnosed with CLL 2012. Most recently patient received Cycle 1 Bendamustine  (without Rituxan ) on 09/08/23.  Imaging 8/29 with extensive bulky lymphadenopathy throughout the lower chest, abdomen, pelvis and groin similar to previous study also with splenic enlargement, noted bone lesions progressive since prior study.  Uric acid 12.3, received rasburicase  on 8/28. -- Uric acid 12.3>5.1..<1.5 -- Further per oncology  Acute renal failure: Resolved Suspect etiology multifactorial with ATN from hypotension/shock as well as prerenal azotemia from severe dehydration.  No hydronephrosis noted on CT abdomen/pelvis at time of admission.  IV fluids now discontinued. -- Cr 2.48>2.36>>1.58>0.92>0.76>>0.79  Anemia, severe Hemoglobin on arrival 1.7; transfused 3 unit PRBCs. -- Hgb 1.7>9.3>10.0>8.7>9.8 -- Transfuse for hemoglobin less than 7.0 -- CBC daily  Thrombocytopenia, severe Platelet count 10K arrival.  Received 1 unit platelet on 8/29, 8/30, 2 u on 8/31, 1u 9/1, 1u 9/2.  Received  Nplate  on 9/2 and 9/8 -- Plt 10>15>18>>14>18>15>27>18>>21>26>34>>35>42 -- Oncology plans weekly Nplate  and PRBC transfusion support starting 9/9; plan to start Venetoclax  in 2 weeks  -- Outpatient follow-up with Dr. Onesimo  Hypokalemia Hypophosphatemia Hypomagnesia Mia Repleted.    HTN Patient was notably hypotensive/shock as above at time of arrival. -- BP 132/77, stable -- Continue monitor BP closely  HLD -- Zocor  20 mg p.o. daily  Type 1 diabetes mellitus On Jardiance  12.5 mg p.o. daily, Lantus  10-30 units [place] Prn at bedtime at baseline -- Sensitive SSI for coverage  Chronic pain Patient reportedly on MS Contin  15 mg p.o. nightly, gabapentin  100 mg p.o. 3 times daily Dilaudid  2 mg p.o. nightly Robaxin  500 mg p.o. nightly -- Hold home MS Contin  -- Continue gabapentin  100 mg PO TID -- Continue dialudid 2 mg PO at bedtime -- Continue Robaxin  500 mg PO at bedtime  Depression Seen by psychiatry on 9/5.  Started on Zoloft .  Continue doxepin  25 mg p.o. nightly.  Weakness/debility/deconditioning/gait disturbance: -- PT/OT recommending SNF, pending insurance authorization.  Currently recommending home health   DVT prophylaxis: SCDs Start: 10/07/23 1835    Code Status: Full Code Family Communication: No family present bedside this morning  Disposition Plan:  Level of care: Telemetry Status is: Inpatient Remains inpatient appropriate because: Platelet transfusion, Nplate  infusion, may need SNF placement    Consultants:  PCCM - singed off 8/31 Medical Oncology Psychiatry  Procedures:  none  Antimicrobials:  Cefepime  8/28 - 8/28 Vancomycin  8/28 - 8/28 Doxycycline  8/28 - 8/30   Subjective: Patient seen examined bedside, lying in bed.  No complaints this morning.  Spouse present at bedside.  Platelet count stable, 42 this morning.  Awaiting insurance authorization for SNF placement.  No other questions, concerns or complaints at this time.  Denies headache, no  dizziness, no chest pain, no palpitations, no shortness of breath, no abdominal pain, no fever/chills/night sweats, no nausea/vomiting.  No acute concerns overnight per nursing staff.  Objective: Vitals:   10/20/23 0707 10/20/23 1239 10/20/23 2032 10/21/23 0423  BP:  128/63 131/61 (!) 123/59  Pulse:  (!) 101 100 (!) 105  Resp:  16 18 18   Temp:  97.7 F (36.5 C) 97.9 F (36.6 C) 97.9 F (36.6 C)  TempSrc:  Oral Oral Oral  SpO2:  98% 98% 96%  Weight: 56.8 kg     Height:        Intake/Output Summary (Last 24 hours) at 10/21/2023 1142 Last data filed at 10/21/2023 0500 Gross per 24 hour  Intake 240 ml  Output 225 ml  Net 15 ml   Filed Weights   10/18/23 0500 10/19/23 0500 10/20/23 0707  Weight: 57.9 kg 56.4 kg 56.8 kg    Examination:  Physical Exam: GEN: NAD, alert and oriented x 3 HEENT: NCAT, PERRL, EOMI, sclera clear, MMM PULM: CTAB w/o wheezes/crackles, normal respiratory effort, room air CV: RRR w/o M/G/R GI: abd soft, NTND, + BS MSK: no peripheral edema, moves all extremity independently NEURO: No focal neurological deficit PSYCH: normal mood/affect Integumentary: No concerning rashes/lesions/wounds noted on exposed skin surfaces    Data Reviewed: I have personally reviewed following labs and imaging studies  CBC: Recent Labs  Lab 10/17/23 0629 10/18/23 0510 10/19/23 0539 10/20/23 0550 10/21/23 0538  WBC 6.0 6.5 5.2 4.6 5.1  NEUTROABS 1.0* 1.0* 0.8* 0.6* 0.6*  HGB 8.3* 8.9* 8.7* 8.0* 8.0*  HCT 26.8* 27.5* 27.1* 26.0* 25.2*  MCV 94.0 92.9 92.8 93.5 92.6  PLT 26* 34* 33*  35* 42*   Basic Metabolic Panel: Recent Labs  Lab 10/15/23 0526 10/16/23 0744 10/17/23 0629 10/19/23 0539  NA 140 140 137 135  K 3.5 4.1 4.2 4.1  CL 105 105 104 102  CO2 25 24 22 23   GLUCOSE 89 153* 96 114*  BUN 10 11 10 11   CREATININE 0.74 0.80 0.73 0.79  CALCIUM  8.1* 8.0* 8.0* 8.3*  MG 1.9 1.8 1.8 1.7  PHOS 3.0 3.2 3.2 3.3   GFR: Estimated Creatinine Clearance: 72  mL/min (by C-G formula based on SCr of 0.79 mg/dL). Liver Function Tests: Recent Labs  Lab 10/15/23 0526 10/16/23 0744 10/17/23 0629  AST 21 25 28   ALT 10 12 12   ALKPHOS 87 89 87  BILITOT 1.1 1.1 1.1  PROT 4.5* 4.6* 4.6*  ALBUMIN 3.1* 3.2* 3.1*   No results for input(s): LIPASE, AMYLASE in the last 168 hours.  No results for input(s): AMMONIA in the last 168 hours. Coagulation Profile: No results for input(s): INR, PROTIME in the last 168 hours.  Cardiac Enzymes: No results for input(s): CKTOTAL, CKMB, CKMBINDEX, TROPONINI in the last 168 hours. BNP (last 3 results) No results for input(s): PROBNP in the last 8760 hours. HbA1C: No results for input(s): HGBA1C in the last 72 hours. CBG: Recent Labs  Lab 10/20/23 2107 10/21/23 0006 10/21/23 0424 10/21/23 0746 10/21/23 1133  GLUCAP 109* 106* 100* 110* 126*   Lipid Profile: No results for input(s): CHOL, HDL, LDLCALC, TRIG, CHOLHDL, LDLDIRECT in the last 72 hours. Thyroid  Function Tests: No results for input(s): TSH, T4TOTAL, FREET4, T3FREE, THYROIDAB in the last 72 hours. Anemia Panel: No results for input(s): VITAMINB12, FOLATE, FERRITIN, TIBC, IRON, RETICCTPCT in the last 72 hours. Sepsis Labs: No results for input(s): PROCALCITON, LATICACIDVEN in the last 168 hours.   No results found for this or any previous visit (from the past 240 hours).        Radiology Studies: No results found.      Scheduled Meds:  sodium chloride    Intravenous Once   acyclovir   400 mg Oral Daily   allopurinol   150 mg Oral Daily   Chlorhexidine  Gluconate Cloth  6 each Topical Daily   doxepin   25 mg Oral QHS   feeding supplement  237 mL Oral BID BM   gabapentin   100 mg Oral TID   HYDROmorphone   2 mg Oral QHS   insulin  aspart  0-6 Units Subcutaneous TID WC   methocarbamol   500 mg Oral QHS   pantoprazole   40 mg Oral QHS   sertraline   25 mg Oral Daily   simvastatin    20 mg Oral q1800   Continuous Infusions:   LOS: 14 days    Time spent: 46 minutes spent on 10/21/2023 caring for this patient face-to-face including chart review, ordering labs/tests, documenting, discussion with nursing staff, consultants, updating family and interview/physical exam    Camellia PARAS [place], DO [facility] Available via Epic secure chat 7am-7pm After these hours, please refer to coverage provider listed on [email or web address] 10/21/2023, 11:42 AM

## 2023-10-22 DIAGNOSIS — R579 Shock, unspecified: Secondary | ICD-10-CM | POA: Diagnosis not present

## 2023-10-22 LAB — CBC WITH DIFFERENTIAL/PLATELET
Abs Immature Granulocytes: 0.11 K/uL — ABNORMAL HIGH (ref 0.00–0.07)
Basophils Absolute: 0.1 K/uL (ref 0.0–0.1)
Basophils Relative: 1 %
Eosinophils Absolute: 0.1 K/uL (ref 0.0–0.5)
Eosinophils Relative: 1 %
HCT: 25 % — ABNORMAL LOW (ref 39.0–52.0)
Hemoglobin: 8 g/dL — ABNORMAL LOW (ref 13.0–17.0)
Immature Granulocytes: 2 %
Lymphocytes Relative: 78 %
Lymphs Abs: 4.6 K/uL — ABNORMAL HIGH (ref 0.7–4.0)
MCH: 30.1 pg (ref 26.0–34.0)
MCHC: 32 g/dL (ref 30.0–36.0)
MCV: 94 fL (ref 80.0–100.0)
Monocytes Absolute: 0.5 K/uL (ref 0.1–1.0)
Monocytes Relative: 8 %
Neutro Abs: 0.6 K/uL — ABNORMAL LOW (ref 1.7–7.7)
Neutrophils Relative %: 10 %
Platelets: 42 K/uL — ABNORMAL LOW (ref 150–400)
RBC: 2.66 MIL/uL — ABNORMAL LOW (ref 4.22–5.81)
RDW: 17.8 % — ABNORMAL HIGH (ref 11.5–15.5)
Smear Review: NORMAL
WBC: 5.9 K/uL (ref 4.0–10.5)
nRBC: 0 % (ref 0.0–0.2)

## 2023-10-22 LAB — GLUCOSE, CAPILLARY
Glucose-Capillary: 136 mg/dL — ABNORMAL HIGH (ref 70–99)
Glucose-Capillary: 129 mg/dL — ABNORMAL HIGH (ref 70–99)
Glucose-Capillary: 178 mg/dL — ABNORMAL HIGH (ref 70–99)
Glucose-Capillary: 157 mg/dL — ABNORMAL HIGH (ref 70–99)

## 2023-10-22 NOTE — Progress Notes (Addendum)
 Physical Therapy Treatment Patient Details Name: Tyler Scott MRN: [id number] DOB: [birth year] Today's Date: 10/22/2023   History of Present Illness Pt sent from Cancer center to ED 2* SOB, weakness and syncope with BP of 79/42 with fall off garage steps into car with spouse.  Pt admitted with severe anemia, thrombocytopenia and hypotension.  Pt is currently undergoing chemo for CLL and has his of DM with diabetic retinopathy.    PT Comments  SIGNIFICANT MOBILITY DECLINE  Pt was able to amb 32 feet with walker last week.   Presents with increased B LE Paraparesis with c/o back pain as well as diminished trunk control.    Today, Pt required INCREASED ASSIST.  General bed mobility comments: required increased assist this session esp with scooting to EOB.  Very weak.  Unable to move legs off side of bed. General transfer comment: Unable to perform a traditional sit to stand due to poor sitting balance and inability to move his own legs off side of bed.  Performed Bear Hug blocking knees and assisted from elevated bed to 1/4 turn to recliner.  Rec Nursing staff use STEDY as Pt was unable to support his own weight. Positioned in recliner to comfort multiple pillows. LPT has rec Pt will need ST Rehab at SNF to address mobility and functional decline prior to safely returning home.    If plan is discharge home, recommend the following: A little help with walking and/or transfers;A little help with bathing/dressing/bathroom;Assistance with cooking/housework;Assist for transportation;Help with stairs or ramp for entrance   Can travel by private vehicle     No  Equipment Recommendations  None recommended by PT    Recommendations for Other Services       Precautions / Restrictions Precautions Precautions: Fall Precaution/Restrictions Comments: orthostatics, near syncope, profoundly weak Restrictions Weight Bearing Restrictions Per Provider Order: No     Mobility  Bed Mobility Overal  bed mobility: Needs Assistance Bed Mobility: Supine to Sit     Supine to sit: Max assist, Total assist     General bed mobility comments: required increased assist this session esp with scooting to EOB.  Very weak.  Unable to move legs off side of bed.    Transfers Overall transfer level: Needs assistance Equipment used: None Transfers: Bed to chair/wheelchair/BSC     Step pivot transfers: Max assist       General transfer comment: Unable to perform a traditional sit to stand due to poor sitting balance and inability to move his own legs off side of bed.  Performed Bear Hug blocking knees and assisted from elevated bed to 1/4 turn to recliner.  Rec Nursing staff use STEDY as Pt was unable to support his own weight.    Ambulation/Gait               General Gait Details: unable to staic stand with walker and unable to support his weight.  Unable to attempt amb.   Stairs             Wheelchair Mobility     Tilt Bed    Modified Rankin (Stroke Patients Only)       Balance                                            Communication    Cognition   Behavior During Therapy: WFL for tasks assessed/performed,  Flat affect   PT - Cognitive impairments: No apparent impairments                       PT - Cognition Comments: AxO x 2.5 slow to respond and mild difficulty finging the right words.  Lives home with Spouse.  Following all commands with increased time.  Pleasant and willing.  Max c/o feeling weak and no energy.  C/O poor appitite because everything tastes bad like Charcole.  Lives home with Spouse. Following commands: Intact      Cueing Cueing Techniques: Verbal cues  Exercises      General Comments        Pertinent Vitals/Pain Pain Assessment Pain Assessment: Faces Faces Pain Scale: Hurts little more Pain Location: back Pain Descriptors / Indicators: Grimacing Pain Intervention(s): Monitored during session,  Patient requesting pain meds-RN notified    Home Living                          Prior Function            PT Goals (current goals can now be found in the care plan section) Progress towards PT goals: Progressing toward goals    Frequency    Min 2X/week      PT Plan      Co-evaluation              AM-PAC PT 6 Clicks Mobility   Outcome Measure  Help needed turning from your back to your side while in a flat bed without using bedrails?: A Lot Help needed moving from lying on your back to sitting on the side of a flat bed without using bedrails?: A Lot Help needed moving to and from a bed to a chair (including a wheelchair)?: A Lot Help needed standing up from a chair using your arms (e.g., wheelchair or bedside chair)?: A Lot Help needed to walk in hospital room?: Total Help needed climbing 3-5 steps with a railing? : Total 6 Click Score: 10    End of Session Equipment Utilized During Treatment: Gait belt Activity Tolerance: Treatment limited secondary to medical complications (Comment) Patient left: in chair;with call bell/phone within reach;with chair alarm set Nurse Communication: Mobility status;Need for lift equipment (STEDY) PT Visit Diagnosis: Difficulty in walking, not elsewhere classified (R26.2);Muscle weakness (generalized) (M62.81)     Time: 1232-1300 PT Time Calculation (min) (ACUTE ONLY): 28 min  Charges:    $Therapeutic Activity: 23-37 mins PT General Charges $$ ACUTE PT VISIT: 1 Visit                     {Coulson Wehner  PTA Acute  Rehabilitation Services Office M-F          [phone]

## 2023-10-22 NOTE — Progress Notes (Signed)
 PROGRESS NOTE    [name]  FMW:[id number] DOB: [birth year] DOA: 10/07/2023 PCP: [facility]    Brief Narrative:   Tyler Scott is a 68 y.o. male with past medical history significant for CLL with recent initiation of chemotherapy, HTN, HLD, DM 1, chronic ITP (baseline Plt 40's) who presented to [facility] ED from the cancer center with complaints of abdominal pain, nausea, poor oral intake, generalized weakness with near syncope and shortness of breath.  Patient was due for next chemotherapy infusion today, 8/28 and when attempting to get to the car had an episode of near syncope with associated shortness of breath and generalized weakness.  EMS was activated in which she was noted to be tachycardic, hypotensive with mild AMS (improved shortly after arrival).  Patient's wife also noted hyperglycemia to the 300s the morning of 10/07/2023 in which she administered insulin .  On ED arrival, patient was afebrile with HR 116, BP 95/67, RR 25, SpO2 94%. Labs were notable for WBC 14.6 (down from 17.1 8/27 with ANC 1100), Hgb 1.7 (8.2), Plt 10 (47). INR > 10 (1 two months PTA). CMP/lipase pending. Trop 46. LA 2.5 > 5.4. VBG 7.21/pCO2 < 18/pO2 < 31/bicarb 5.6. Initial CBG 40. CXR demonstrated small L pleural effusion and L lung base atelectasis with R paratracheal adenoapthyp; CT A/P showed extensive bulky LAD throughout the lower chest, abdomen, pelvis and groin regions (similar from prior), splenic enlargement with peripheral splenic infarct, small lucent lesions in the pelvis c/f lymphomatous involvement, moderate L and small R pleural effusions with atelectasis. Broad-spectrum antibiotics were started, Cx pending. Levophed  started for hypotension. Oncology consulted. PCCM consulted for ICU admission.  Significant hospital events: 8/28: Admit ICU, started on vasopressors, oncology consulted; transfused 2 unit PRBC 8/29: Levophed  titrated off; received rasburicase , 1 unit platelets, 1  unit PRBC transfusion 8/30: 1 unit platelet transfusion 8/31: Transferred to [facility]; 2 unit platelet transfusion 9/1: 1u platelet transfusion, transfer to 6 E. 9/2: transfuse 1u Plt, to receive NPLATE  per oncology  9/8: Received second dose of Nplate  9/12: Platelet count stable, 42 awaiting insurance authorization for SNF   Assessment & Plan:   Shock, undifferentiated Concern for sepsis, ruled out Patient presenting with weakness and noted to be hypotensive, initially admitted to the intensive care unit on vasopressors.  Suspect etiology likely secondary to severe dehydration, poor oral intake with recent chemotherapy infusion as well as tumor lysis syndrome as above.  Initially was started on empiric antibiotics with vancomycin , cefepime  and doxycycline  which were stopped.  Acute hypoxemic respiratory failure: Resolved Supplemental oxygen weaned off.  CLL/SLL/low-grade lymphoma Tumor lysis syndrome Patient initially diagnosed with CLL 2012. Most recently patient received Cycle 1 Bendamustine  (without Rituxan ) on 09/08/23.  Imaging 8/29 with extensive bulky lymphadenopathy throughout the lower chest, abdomen, pelvis and groin similar to previous study also with splenic enlargement, noted bone lesions progressive since prior study.  Uric acid 12.3, received rasburicase  on 8/28. -- Uric acid 12.3>5.1..<1.5 -- Further per oncology  Acute renal failure: Resolved Suspect etiology multifactorial with ATN from hypotension/shock as well as prerenal azotemia from severe dehydration.  No hydronephrosis noted on CT abdomen/pelvis at time of admission.  IV fluids now discontinued. -- Cr 2.48>2.36>>1.58>0.92>0.76>>0.79  Anemia, severe Hemoglobin on arrival 1.7; transfused 3 unit PRBCs. -- Hgb 1.7>9.3>10.0>8.7>9.8 -- Transfuse for hemoglobin less than 7.0 -- CBC daily  Thrombocytopenia, severe Platelet count 10K arrival.  Received 1 unit platelet on 8/29, 8/30, 2 u on 8/31, 1u 9/1, 1u 9/2.  Received  Nplate  on 9/2 and 9/8 -- Plt 10>15>18>>14>18>15>27>18>>21>26>34>>35>42>42 -- Oncology plans weekly Nplate  and PRBC transfusion support starting 9/9; plan to start Venetoclax  in 2 weeks  -- Outpatient follow-up with Dr. Onesimo  Hypokalemia Hypophosphatemia Hypomagnesia Mia Repleted.    HTN Patient was notably hypotensive/shock as above at time of arrival. -- BP 126/65, stable -- Continue monitor BP closely  HLD -- Zocor  20 mg p.o. daily  Type 1 diabetes mellitus On Jardiance  12.5 mg p.o. daily, Lantus  10-30 units Iaeger Prn at bedtime at baseline -- Sensitive SSI for coverage  Chronic pain Patient reportedly on MS Contin  15 mg p.o. nightly, gabapentin  100 mg p.o. 3 times daily Dilaudid  2 mg p.o. nightly Robaxin  500 mg p.o. nightly -- Hold home MS Contin  -- Continue gabapentin  100 mg PO TID -- Continue dialudid 2 mg PO at bedtime -- Continue Robaxin  500 mg PO at bedtime  Depression Seen by psychiatry on 9/5.  Started on Zoloft .  Continue doxepin  25 mg p.o. nightly.  Weakness/debility/deconditioning/gait disturbance: -- PT/OT recommending SNF, pending insurance authorization.  Currently recommending home health   DVT prophylaxis: SCDs Start: 10/07/23 1835    Code Status: Full Code Family Communication: No family present bedside this morning  Disposition Plan:  Level of care: Telemetry Status is: Inpatient Remains inpatient appropriate because: Pending SNF placement    Consultants:  PCCM - singed off 8/31 Medical Oncology Psychiatry  Procedures:  none  Antimicrobials:  Cefepime  8/28 - 8/28 Vancomycin  8/28 - 8/28 Doxycycline  8/28 - 8/30   Subjective: Patient seen examined bedside, lying in bed.  No complaints this morning.  Spouse present at bedside.  Platelet count stable, 42 this morning.  Awaiting insurance authorization for SNF placement.  No other questions, concerns or complaints at this time.  Denies headache, no dizziness, no chest pain, no palpitations, no  shortness of breath, no abdominal pain, no fever/chills/night sweats, no nausea/vomiting.  No acute concerns overnight per nursing staff.  Objective: Vitals:   10/20/23 2032 10/21/23 0423 10/21/23 1328 10/21/23 2144  BP: 131/61 (!) 123/59 127/66 126/65  Pulse: 100 (!) 105 (!) 110 (!) 110  Resp: 18 18 18 17   Temp: 97.9 F (36.6 C) 97.9 F (36.6 C) 97.7 F (36.5 C) 98.9 F (37.2 C)  TempSrc: Oral Oral Oral Oral  SpO2: 98% 96% 96% 97%  Weight:      Height:        Intake/Output Summary (Last 24 hours) at 10/22/2023 1235 Last data filed at 10/21/2023 1800 Gross per 24 hour  Intake --  Output 500 ml  Net -500 ml   Filed Weights   10/18/23 0500 10/19/23 0500 10/20/23 0707  Weight: 57.9 kg 56.4 kg 56.8 kg    Examination:  Physical Exam: GEN: NAD, alert and oriented x 3 HEENT: NCAT, PERRL, EOMI, sclera clear, MMM PULM: CTAB w/o wheezes/crackles, normal respiratory effort, room air CV: RRR w/o M/G/R GI: abd soft, NTND, + BS MSK: no peripheral edema, moves all extremity independently NEURO: No focal neurological deficit PSYCH: normal mood/affect Integumentary: No concerning rashes/lesions/wounds noted on exposed skin surfaces    Data Reviewed: I have personally reviewed following labs and imaging studies  CBC: Recent Labs  Lab 10/18/23 0510 10/19/23 0539 10/20/23 0550 10/21/23 0538 10/22/23 0524  WBC 6.5 5.2 4.6 5.1 5.9  NEUTROABS 1.0* 0.8* 0.6* 0.6* 0.6*  HGB 8.9* 8.7* 8.0* 8.0* 8.0*  HCT 27.5* 27.1* 26.0* 25.2* 25.0*  MCV 92.9 92.8 93.5 92.6 94.0  PLT 34* 33* 35* 42* 42*  Basic Metabolic Panel: Recent Labs  Lab 10/16/23 0744 10/17/23 0629 10/19/23 0539  NA 140 137 135  K 4.1 4.2 4.1  CL 105 104 102  CO2 24 22 23   GLUCOSE 153* 96 114*  BUN 11 10 11   CREATININE 0.80 0.73 0.79  CALCIUM  8.0* 8.0* 8.3*  MG 1.8 1.8 1.7  PHOS 3.2 3.2 3.3   GFR: Estimated Creatinine Clearance: 72 mL/min (by C-G formula based on SCr of 0.79 mg/dL). Liver Function  Tests: Recent Labs  Lab 10/16/23 0744 10/17/23 0629  AST 25 28  ALT 12 12  ALKPHOS 89 87  BILITOT 1.1 1.1  PROT 4.6* 4.6*  ALBUMIN 3.2* 3.1*   No results for input(s): LIPASE, AMYLASE in the last 168 hours.  No results for input(s): AMMONIA in the last 168 hours. Coagulation Profile: No results for input(s): INR, PROTIME in the last 168 hours.  Cardiac Enzymes: No results for input(s): CKTOTAL, CKMB, CKMBINDEX, TROPONINI in the last 168 hours. BNP (last 3 results) No results for input(s): PROBNP in the last 8760 hours. HbA1C: No results for input(s): HGBA1C in the last 72 hours. CBG: Recent Labs  Lab 10/21/23 1133 10/21/23 1623 10/21/23 2146 10/22/23 0714 10/22/23 1155  GLUCAP 126* 134* 122* 136* 129*   Lipid Profile: No results for input(s): CHOL, HDL, LDLCALC, TRIG, CHOLHDL, LDLDIRECT in the last 72 hours. Thyroid  Function Tests: No results for input(s): TSH, T4TOTAL, FREET4, T3FREE, THYROIDAB in the last 72 hours. Anemia Panel: No results for input(s): VITAMINB12, FOLATE, FERRITIN, TIBC, IRON, RETICCTPCT in the last 72 hours. Sepsis Labs: No results for input(s): PROCALCITON, LATICACIDVEN in the last 168 hours.   No results found for this or any previous visit (from the past 240 hours).        Radiology Studies: No results found.      Scheduled Meds:  sodium chloride    Intravenous Once   acyclovir   400 mg Oral Daily   allopurinol   150 mg Oral Daily   Chlorhexidine  Gluconate Cloth  6 each Topical Daily   doxepin   25 mg Oral QHS   feeding supplement  237 mL Oral BID BM   gabapentin   100 mg Oral TID   HYDROmorphone   2 mg Oral QHS   insulin  aspart  0-6 Units Subcutaneous TID WC   methocarbamol   500 mg Oral QHS   pantoprazole   40 mg Oral QHS   sertraline   25 mg Oral Daily   simvastatin   20 mg Oral q1800   Continuous Infusions:   LOS: 15 days    Time spent: 46 minutes spent on  10/22/2023 caring for this patient face-to-face including chart review, ordering labs/tests, documenting, discussion with nursing staff, consultants, updating family and interview/physical exam    Camellia PARAS [place], DO [facility] Available via Epic secure chat 7am-7pm After these hours, please refer to coverage provider listed on [email or web address] 10/22/2023, 12:35 PM

## 2023-10-22 NOTE — Plan of Care (Signed)

## 2023-10-22 NOTE — TOC Progression Note (Addendum)
 Transition of Care [facility]) - Progression Note    Patient Details  Name: Tyler Scott MRN: [id number] Date of Birth: 07/01/1955  Transition of Care [facility]) CM/SW Contact  Toy LITTIE Agar, RN Phone Number:[phone]  10/22/2023, 10:31 AM  Clinical Narrative:    CM spoke with Rosaline at [facility] in reference to insurance auth. Per Rosaline patient has a HMO plan and [facility] is a non contracted facility. Patient has no out of network benefits. Patient will need to be redirected to a PAR provider. Options for rehab facility include([facility], [facility], [facility], [facility], [facility], [facility], [facility], [facility], [facility], [facility], [facility], [facility])  1145 Cm at bedside to make patient aware of [facility] being out of network. Patient verbalized understanding. CM has presented patient with list of in network facilities.   1150 Wife left message that patient and family would like to proceed with [facility] healthcare as choice for SNF.   1159 Cm attempted to call Kia at [facility] to confirm bed is still available. There is no answer. Voicemail has been left. Will await return call.   1244 Kia with [facility] has confirmed that facility can accept patient . Insurance auth updated and pending.   Expected Discharge Plan: Home w Home Health Services Barriers to Discharge: Insurance Authorization, SNF Authorization Denied               Expected Discharge Plan and Services In-house Referral: Clinical Social Work Discharge Planning Services: NA Post Acute Care Choice: Home Health Living arrangements for the past 2 months: Single  Family Home                 DME Arranged: N/A DME Agency: NA       HH Arranged: NA HH Agency: Other - See comment Producer, television/film/video) Date HH Agency Contacted: 10/11/23 Time HH Agency Contacted: 1422 Representative spoke with at [facility] Agency: Jon   Social Drivers of Health (SDOH) Interventions SDOH Screenings   Food Insecurity: No Food Insecurity (10/08/2023)  Housing: Low Risk  (10/08/2023)  Transportation Needs: No Transportation Needs (10/08/2023)  Utilities: Not At Risk (10/08/2023)  Depression (PHQ2-9): Low Risk  (09/14/2023)  Social Connections: Unknown (10/08/2023)  Recent Concern: Social Connections - Moderately Isolated (10/08/2023)  Tobacco Use: Low Risk  (10/07/2023)    Readmission Risk Interventions    10/11/2023    2:19 PM 08/30/2023   11:40 AM 08/07/2023    8:49 AM  Readmission Risk Prevention Plan  Transportation Screening Complete Complete Complete  PCP or Specialist Appt within 5-7 Days   Complete  PCP or Specialist Appt within 3-5 Days  Complete   Home Care Screening   Complete  Medication Review (RN CM)   Complete  HRI or Home Care Consult  Complete   Social Work Consult for Recovery Care Planning/Counseling  Complete   Palliative Care Screening  Complete   Medication Review Oceanographer) Complete Complete   PCP or Specialist appointment within 3-5 days of discharge Complete    HRI or Home Care Consult Complete    SW Recovery Care/Counseling Consult Complete    Palliative Care Screening Not Applicable    Skilled Nursing Facility Not Applicable

## 2023-10-23 DIAGNOSIS — R579 Shock, unspecified: Secondary | ICD-10-CM | POA: Diagnosis not present

## 2023-10-23 LAB — GLUCOSE, CAPILLARY
Glucose-Capillary: 127 mg/dL — ABNORMAL HIGH (ref 70–99)
Glucose-Capillary: 154 mg/dL — ABNORMAL HIGH (ref 70–99)
Glucose-Capillary: 155 mg/dL — ABNORMAL HIGH (ref 70–99)
Glucose-Capillary: 163 mg/dL — ABNORMAL HIGH (ref 70–99)
Glucose-Capillary: 163 mg/dL — ABNORMAL HIGH (ref 70–99)
Glucose-Capillary: 195 mg/dL — ABNORMAL HIGH (ref 70–99)

## 2023-10-23 MED ORDER — TRAMADOL HCL 50 MG PO TABS: 50.0000 mg | ORAL_TABLET | Freq: Four times a day (QID) | ORAL | 0 refills | Status: DC | PRN

## 2023-10-23 MED ORDER — POLYETHYLENE GLYCOL 3350 17 G PO PACK
17.0000 g | PACK | Freq: Every day | ORAL | Status: DC | PRN
  Administered 2023-10-23: 17 g via ORAL
  Filled 2023-10-23: qty 1

## 2023-10-23 MED ORDER — SENNOSIDES-DOCUSATE SODIUM 8.6-50 MG PO TABS: 1.0000 | ORAL_TABLET | Freq: Two times a day (BID) | ORAL | Status: AC

## 2023-10-23 MED ORDER — HYDROMORPHONE HCL 2 MG PO TABS: 2.0000 mg | ORAL_TABLET | Freq: Every day | ORAL | 0 refills | Status: DC

## 2023-10-23 MED ORDER — METOPROLOL TARTRATE 25 MG PO TABS
12.5000 mg | ORAL_TABLET | Freq: Two times a day (BID) | ORAL | Status: DC
  Administered 2023-10-23 (×2): 12.5 mg via ORAL
  Filled 2023-10-23 (×2): qty 1

## 2023-10-23 MED ORDER — METHOCARBAMOL 500 MG PO TABS: 500.0000 mg | ORAL_TABLET | Freq: Every day | ORAL | Status: AC

## 2023-10-23 MED ORDER — SENNOSIDES-DOCUSATE SODIUM 8.6-50 MG PO TABS
1.0000 | ORAL_TABLET | Freq: Two times a day (BID) | ORAL | Status: DC
  Administered 2023-10-23 (×2): 1 via ORAL
  Filled 2023-10-23 (×2): qty 1

## 2023-10-23 MED ORDER — SERTRALINE HCL 25 MG PO TABS: 25.0000 mg | ORAL_TABLET | Freq: Every day | ORAL | Status: AC

## 2023-10-23 MED ORDER — INSULIN ASPART 100 UNIT/ML IJ SOLN: 0.0000 [IU] | Freq: Three times a day (TID) | INTRAMUSCULAR | Status: DC

## 2023-10-23 MED ORDER — METOPROLOL TARTRATE 25 MG PO TABS: 12.5000 mg | ORAL_TABLET | Freq: Two times a day (BID) | ORAL | Status: DC

## 2023-10-23 MED ORDER — PANTOPRAZOLE SODIUM 40 MG PO TBEC: 40.0000 mg | DELAYED_RELEASE_TABLET | Freq: Every day | ORAL | Status: AC

## 2023-10-23 NOTE — Plan of Care (Signed)

## 2023-10-23 NOTE — Progress Notes (Signed)
 Patient cont to wait for PTAR, medicated for pain as ordered, in bed resting, call light in reach

## 2023-10-23 NOTE — Plan of Care (Signed)

## 2023-10-23 NOTE — Discharge Summary (Signed)
 Physician Discharge Summary  Tyler Scott FMW:[id number] DOB: [birth year] DOA: 10/07/2023  PCP: Clinic, Tyler Scott  Admit date: 10/07/2023 Discharge date: 10/23/2023  Admitted From: Home Disposition:  [facility]  Recommendations for Outpatient Follow-up:  Follow up with PCP in 1-2 weeks Follow-up with medical oncology, Tyler Scott  Discharge Condition: Stable CODE STATUS: Full code Diet recommendation: Regular diet  History of present illness:  Tyler Scott is a 68 y.o. male with past medical history significant for CLL with recent initiation of chemotherapy, HTN, HLD, DM 1, chronic ITP (baseline Plt 40's) who presented to [facility] ED from the cancer center with complaints of abdominal pain, nausea, poor oral intake, generalized weakness with near syncope and shortness of breath.  Patient was due for next chemotherapy infusion today, 8/28 and when attempting to get to the car had an episode of near syncope with associated shortness of breath and generalized weakness.  EMS was activated in which she was noted to be tachycardic, hypotensive with mild AMS (improved shortly after arrival).  Patient's wife also noted hyperglycemia to the 300s the morning of 10/07/2023 in which she administered insulin .   On ED arrival, patient was afebrile with HR 116, BP 95/67, RR 25, SpO2 94%. Labs were notable for WBC 14.6 (down from 17.1 8/27 with ANC 1100), Hgb 1.7 (8.2), Plt 10 (47). INR > 10 (1 two months PTA). CMP/lipase pending. Trop 46. LA 2.5 > 5.4. VBG 7.21/pCO2 < 18/pO2 < 31/bicarb 5.6. Initial CBG 40. CXR demonstrated small L pleural effusion and L lung base atelectasis with R paratracheal adenoapthyp; CT A/P showed extensive bulky LAD throughout the lower chest, abdomen, pelvis and groin regions (similar from prior), splenic enlargement with peripheral splenic infarct, small lucent lesions in the pelvis c/f lymphomatous involvement, moderate L and small R pleural effusions with atelectasis.  Broad-spectrum antibiotics were started, Cx pending. Levophed  started for hypotension. Oncology consulted. PCCM consulted for ICU admission.   Significant hospital events: 8/28: Admit ICU, started on vasopressors, oncology consulted; transfused 2 unit PRBC 8/29: Levophed  titrated off; received rasburicase , 1 unit platelets, 1 unit PRBC transfusion 8/30: 1 unit platelet transfusion 8/31: Transferred to [facility]; 2 unit platelet transfusion 9/1: 1u platelet transfusion, transfer to 6 E. 9/2: transfuse 1u Plt, to receive NPLATE  per oncology  9/8: Received second dose of Nplate  9/12: Platelet count stable, 42 awaiting insurance authorization for SNF 9/13: Discharging to SNF after receiving insurance authorization  Hospital course:  Shock, undifferentiated Concern for sepsis, ruled out Patient presenting with weakness and noted to be hypotensive, initially admitted to the intensive care unit on vasopressors.  Suspect etiology likely secondary to severe dehydration, poor oral intake with recent chemotherapy infusion as well as tumor lysis syndrome as above.  Initially was started on empiric antibiotics with vancomycin , cefepime  and doxycycline  which were stopped.   Acute hypoxemic respiratory failure: Resolved Supplemental oxygen weaned off.   CLL/SLL/low-grade lymphoma Tumor lysis syndrome Patient initially diagnosed with CLL 2012. Most recently patient received Cycle 1 Bendamustine  (without Rituxan ) on 09/08/23.  Imaging 8/29 with extensive bulky lymphadenopathy throughout the lower chest, abdomen, pelvis and groin similar to previous study also with splenic enlargement, noted bone lesions progressive since prior study.  Uric acid 12.3, received rasburicase  on 8/28.  Uric acid improved to less than 1.5.  Outpatient follow-up with medical oncology.   Acute renal failure: Resolved Suspect etiology multifactorial with ATN from hypotension/shock as well as prerenal azotemia from severe dehydration.  No  hydronephrosis noted on CT abdomen/pelvis at  time of admission.  IV fluids now discontinued.  Renal function peaked at 2.48 with creatinine improving to 0.79 at time of discharge.   Anemia, severe Hemoglobin on arrival 1.7; transfused 3 unit PRBCs.  Hemoglobin improved, 9.8 Tyler Scott discharge.   Thrombocytopenia, severe Platelet count 10K arrival.  Received 1 unit platelet on 8/29, 8/30, 2 u on 8/31, 1u 9/1, 1u 9/2.  Received Nplate  on 9/2 and 9/8.  Platelet count improved, up to 42 at time of discharge. Oncology plans weekly Nplate  and PRBC transfusion support starting 9/9; plan to start Venetoclax . Outpatient follow-up with Tyler Scott   Hypokalemia Hypophosphatemia Hypomagnesia Mia Repleted.     HTN Patient was notably hypotensive/shock as above at time of arrival. -- BP 126/65, stable -- Continue monitor BP closely   HLD Zocor  20 mg p.o. daily   Type 1 diabetes mellitus Given fluctuating oral intake, continue to monitor blood sugar closely and cover with insulin  as needed.   Chronic pain Continue gabapentin , Dilaudid  2 mg p.o. nightly, Robaxin  nightly, tramadol  as needed.   Depression Seen by psychiatry on 9/5.  Started on Zoloft .  Continue doxepin  25 mg p.o. nightly.   Weakness/debility/deconditioning/gait disturbance: PT/OT recommending SNF  Discharge Diagnoses:  Principal Problem:   Shock (HCC) Active Problems:   Vitamin K deficiency   Tumor lysis syndrome   Symptomatic anemia   Pressure injury of skin   Adjustment disorder with mixed anxiety and depressed mood    Discharge Instructions  Discharge Instructions     Call MD for:  difficulty breathing, headache or visual disturbances   Complete by: As directed    Call MD for:  extreme fatigue   Complete by: As directed    Call MD for:  persistant dizziness or light-headedness   Complete by: As directed    Call MD for:  persistant nausea and vomiting   Complete by: As directed    Call MD for:  severe  uncontrolled pain   Complete by: As directed    Call MD for:  temperature >100.4   Complete by: As directed    Diet - low sodium heart healthy   Complete by: As directed    Increase activity slowly   Complete by: As directed    No wound care   Complete by: As directed       Allergies as of 10/23/2023   No Known Allergies      Medication List     STOP taking these medications    ALKA-SELTZER PO   amLODipine  10 MG tablet Commonly known as: NORVASC    finasteride  5 MG tablet Commonly known as: PROSCAR    insulin  glargine 100 UNIT/ML Solostar Pen Commonly known as: Lantus  SoloStar   morphine  15 MG 12 hr tablet Commonly known as: MS CONTIN    prochlorperazine  10 MG tablet Commonly known as: COMPAZINE    Vemlidy  25 MG tablet Generic drug: tenofovir alafenamide       TAKE these medications    acyclovir  400 MG tablet Commonly known as: ZOVIRAX  Take 1 tablet (400 mg total) by mouth daily.   allopurinol  300 MG tablet Commonly known as: ZYLOPRIM  Take 150 mg by mouth daily.   Cholecalciferol 50 MCG (2000 UT) Tabs TAKE ONE TABLET BY MOUTH DAILY FOR LOW VITAMIN D .   dexamethasone  4 MG tablet Commonly known as: DECADRON  Take 4 mg by mouth daily. For 2 days after chemo   doxepin  25 MG capsule Commonly known as: SINEQUAN  Take 1 capsule by mouth at bedtime.   empagliflozin   25 MG Tabs tablet Commonly known as: JARDIANCE  Take 0.5 tablets by mouth daily as needed (high blood sugar).   gabapentin  100 MG capsule Commonly known as: NEURONTIN  Take 1 capsule (100 mg total) by mouth 3 (three) times daily.   HYDROmorphone  2 MG tablet Commonly known as: DILAUDID  Take 1 tablet (2 mg total) by mouth at bedtime.   insulin  aspart 100 UNIT/ML injection Commonly known as: novoLOG  Inject 0-6 Units into the skin 3 (three) times daily with meals.   methocarbamol  500 MG tablet Commonly known as: ROBAXIN  Take 1 tablet (500 mg total) by mouth at bedtime.   metoprolol  tartrate  25 MG tablet Commonly known as: LOPRESSOR  Take 0.5 tablets (12.5 mg total) by mouth 2 (two) times daily.   ondansetron  8 MG tablet Commonly known as: Zofran  Take 1 tablet (8 mg total) by mouth every 8 (eight) hours as needed for nausea or vomiting. Start on the third day after chemotherapy. What changed:  when to take this additional instructions   ONE TOUCH ULTRA TEST test strip Generic drug: glucose blood TEST GLUCOSE 3 TIMES A DAY   OneTouch Delica Lancets 33G Misc TEST GLUCOSE 3 TIMES A DAY   pantoprazole  40 MG tablet Commonly known as: PROTONIX  Take 1 tablet (40 mg total) by mouth at bedtime.   polyethylene glycol powder 17 GM/SCOOP powder Commonly known as: GLYCOLAX /MIRALAX  Take 17 g dissolved in liquid by mouth daily.   senna-docusate 8.6-50 MG tablet Commonly known as: Senokot-S Take 1 tablet by mouth 2 (two) times daily.   sertraline  25 MG tablet Commonly known as: ZOLOFT  Take 1 tablet (25 mg total) by mouth daily. Start taking on: October 24, 2023   simvastatin  20 MG tablet Commonly known as: ZOCOR  TAKE 1 TABLET (20 MG TOTAL) BY MOUTH AT BEDTIME.   traMADol  50 MG tablet Commonly known as: ULTRAM  Take 1 tablet (50 mg total) by mouth every 6 (six) hours as needed for moderate pain (pain score 4-6).   venetoclax  10 & 50 & 100 MG Starter Pack Commonly known as: VENCLEXTA  Take by mouth daily. Take 20 mg for 7 days, then 50 mg daily x 7d, then 100 mg daily x 7d, then 200 mg daily x 7d. Take with food & water.        Contact information for follow-up providers     [facility] [place], [place] Follow up.   Why: Please continue home health PT/OT services with this provider upon discharge. Contact information: [address] [place] [address] [phone]         Clinic, Tyler Scott. Schedule an appointment as soon as possible for a visit in 1 week(s).   Contact information: [address] [place] [address] [phone]         Scott Emaline Brink, MD. Schedule an appointment as soon as possible for a visit.   Specialties: Hematology, Oncology Contact information: [address] [place] [address] [phone]              Contact information for after-discharge care     Destination     [facility] .   Service: Skilled Nursing Contact information: [address] [place]  [address] [phone]                    No Known Allergies  Consultations: PCCM - singed off 8/31 Medical Oncology Psychiatry   Procedures/Studies: CT HEAD WO CONTRAST ( ) Result Date: 10/07/2023  CLINICAL DATA:  Recent fall with headaches, initial encounter EXAM: CT HEAD WITHOUT CONTRAST TECHNIQUE: Contiguous axial images were obtained from the base of the skull through the vertex without intravenous contrast. RADIATION DOSE REDUCTION: This exam was performed according to the departmental dose-optimization program which includes automated exposure control, adjustment of the mA and/or kV according to patient size and/or use of iterative reconstruction technique. COMPARISON:  None Available. FINDINGS: Brain: No evidence of acute infarction, hemorrhage, hydrocephalus, extra-axial collection or mass lesion/mass effect. Mild atrophic changes are noted. Vascular: No hyperdense vessel or unexpected calcification. Skull: Normal. Negative for fracture or focal lesion. Sinuses/Orbits: No acute finding. Other: None. IMPRESSION: No acute intracranial abnormality noted. Electronically Signed   By: Oneil Devonshire M.D.   On: 10/07/2023 21:12   CT ABDOMEN PELVIS W CONTRAST Result Date: 10/07/2023 CLINICAL DATA:  Acute nonlocalized abdominal pain. History of CLL. Hypotensive [facility] with tachycardia and decreased oxygenation. EXAM: CT ABDOMEN AND PELVIS WITH CONTRAST TECHNIQUE: Multidetector CT imaging of the abdomen and pelvis was performed  using the standard protocol following bolus administration of intravenous contrast. RADIATION DOSE REDUCTION: This exam was performed according to the departmental dose-optimization program which includes automated exposure control, adjustment of the mA and/or kV according to patient size and/or use of iterative reconstruction technique. CONTRAST:  OMNIPAQUE  IOHEXOL  300 MG/ML  SOLN COMPARISON:  08/06/2023 FINDINGS: Lower chest: Large left and small right pleural effusions. Basilar atelectasis or consolidation also greater on the left. Cardiac enlargement. Hepatobiliary: No focal liver abnormality is seen. No gallstones, gallbladder wall thickening, or biliary dilatation. Pancreas: Unremarkable. No pancreatic ductal dilatation or surrounding inflammatory changes. Spleen: Spleen is enlarged. Focal wedge-shaped defect in the posterior periphery likely representing splenic infarct. Adrenals/Urinary Tract: No adrenal gland nodules. Kidneys are symmetrical. No hydronephrosis or hydroureter. Bladder is normal. Stomach/Bowel: Stomach, small bowel, and colon are not abnormally distended. No wall thickening or inflammatory infiltration. Appendix is normal. Vascular/Lymphatic: Calcification of the aorta. No aneurysm. There is extensive bulky lymphadenopathy involving the retrocrural space, celiac axis, splenic hilar region, throughout the retroperitoneum, throughout the mesentery, bilateral anterior and posterior pelvic chains, in the sciatic region, and in the groin regions. Largest retroperitoneal lymph node mass measures about 8.9 x 8.5 cm on transverse dimension. Measured at a similar level, there is no significant change since the prior study. Reproductive: Prostate is unremarkable. Other: Small amount of free fluid along the pericolic gutters. No free air. Musculoskeletal: Degenerative changes in the spine. Lucent lesions demonstrated in the sacrum and pelvis, most prominent on the right. Focal lesions measure up  to about 1.7 cm diameter. These lesions are progressive since prior study. IMPRESSION: 1. Extensive bulky lymphadenopathy throughout the lower chest, abdomen, pelvis, and groin regions. Lymphadenopathy is similar to the previous study. 2. Splenic enlargement with peripheral splenic infarct. This is likely due to lymphomatous involvement. 3. Small lucent lesions in the pelvis may represent lymphomatous involvement. Bone lesions appear progressive since prior study. 4. Moderate left and small right pleural effusions with basilar atelectasis, progressing since prior study. Cardiac enlargement. Electronically Signed   By: Elsie Gravely M.D.   On: 10/07/2023 17:51   DG Chest Port 1 View Result Date: 10/07/2023 CLINICAL DATA:  Questionable sepsis. EXAM: PORTABLE CHEST 1 VIEW COMPARISON:  Chest CT dated 08/07/2023. FINDINGS: Small left pleural effusion and left lung base atelectasis or infiltrate. No pneumothorax. The cardiac silhouette is within limits. Right paratracheal density consistent with adenopathy. No acute osseous pathology. IMPRESSION: 1. Small left pleural effusion  and left lung base atelectasis or infiltrate. 2. Right paratracheal adenopathy. Electronically Signed   By: Vanetta Chou M.D.   On: 10/07/2023 15:59     Subjective: Patient seen examined bedside, lying in bed.  Continues with significant weakness, especially lower extremities.  No family present.  No other complaints.  Denies headache, no dizziness, no chest pain, no palpitations, no shortness of breath, no abdominal pain, no fever/chills/night sweats, no nausea/vomiting.  No acute concerns overnight per nursing staff.  Discharging to SNF after receiving insurance authorization today.  Outpatient follow-up with medical oncology.  Discharge Exam: Vitals:   10/23/23 0413 10/23/23 0756  BP: 131/65 137/70  Pulse: (!) 117 (!) 112  Resp: 18 18  Temp: 98 F (36.7 C) 97.6 F (36.4 C)  SpO2: 95% 98%   Vitals:   10/23/23 0003  10/23/23 0413 10/23/23 0500 10/23/23 0756  BP: 139/68 131/65  137/70  Pulse: (!) 110 (!) 117  (!) 112  Resp: 18 18  18   Temp: 98.3 F (36.8 C) 98 F (36.7 C)  97.6 F (36.4 C)  TempSrc: Oral   Oral  SpO2: 96% 95%  98%  Weight:   56.9 kg   Height:        Physical Exam: GEN: NAD, alert and oriented x 3, chronically ill/thin in appearance HEENT: NCAT, PERRL, EOMI, sclera clear, MMM PULM: CTAB w/o wheezes/crackles, normal respiratory effort, room air CV: RRR w/o M/G/R GI: abd soft, NTND, + BS MSK: no peripheral edema, LLE with MS 1+/5, RLE 2/5, moves upper extremities independently with preserved muscle strength NEURO: No focal neurological deficit other than strength deficit of bilateral lower extremities as above PSYCH: normal mood/affect Integumentary: No concerning rashes/lesions/wounds noted on exposed skin surfaces    The results of significant diagnostics from this hospitalization (including imaging, microbiology, ancillary and laboratory) are listed below for reference.     Microbiology: No results found for this or any previous visit (from the past 240 hours).   Labs: BNP (last 3 results) No results for input(s): BNP in the last 8760 hours. Basic Metabolic Panel: Recent Labs  Lab 10/17/23 0629 10/19/23 0539  NA 137 135  K 4.2 4.1  CL 104 102  CO2 22 23  GLUCOSE 96 114*  BUN 10 11  CREATININE 0.73 0.79  CALCIUM  8.0* 8.3*  MG 1.8 1.7  PHOS 3.2 3.3   Liver Function Tests: Recent Labs  Lab 10/17/23 0629  AST 28  ALT 12  ALKPHOS 87  BILITOT 1.1  PROT 4.6*  ALBUMIN 3.1*   No results for input(s): LIPASE, AMYLASE in the last 168 hours. No results for input(s): AMMONIA in the last 168 hours. CBC: Recent Labs  Lab 10/18/23 0510 10/19/23 0539 10/20/23 0550 10/21/23 0538 10/22/23 0524  WBC 6.5 5.2 4.6 5.1 5.9  NEUTROABS 1.0* 0.8* 0.6* 0.6* 0.6*  HGB 8.9* 8.7* 8.0* 8.0* 8.0*  HCT 27.5* 27.1* 26.0* 25.2* 25.0*  MCV 92.9 92.8 93.5 92.6 94.0   PLT 34* 33* 35* 42* 42*   Cardiac Enzymes: No results for input(s): CKTOTAL, CKMB, CKMBINDEX, TROPONINI in the last 168 hours. BNP: Invalid input(s): POCBNP CBG: Recent Labs  Lab 10/22/23 2025 10/23/23 0005 10/23/23 0415 10/23/23 0758 10/23/23 1126  GLUCAP 157* 155* 154* 163* 195*   D-Dimer No results for input(s): DDIMER in the last 72 hours. Hgb A1c No results for input(s): HGBA1C in the last 72 hours. Lipid Profile No results for input(s): CHOL, HDL, LDLCALC, TRIG, CHOLHDL, LDLDIRECT in the last  72 hours. Thyroid  function studies No results for input(s): TSH, T4TOTAL, T3FREE, THYROIDAB in the last 72 hours.  Invalid input(s): FREET3 Anemia work up No results for input(s): VITAMINB12, FOLATE, FERRITIN, TIBC, IRON, RETICCTPCT in the last 72 hours. Urinalysis    Component Value Date/Time   COLORURINE YELLOW 10/07/2023 1952   APPEARANCEUR HAZY (A) 10/07/2023 1952   LABSPEC 1.026 10/07/2023 1952   PHURINE 5.0 10/07/2023 1952   GLUCOSEU >=500 (A) 10/07/2023 1952   HGBUR SMALL (A) 10/07/2023 1952   HGBUR negative 07/09/2009 0853   BILIRUBINUR NEGATIVE 10/07/2023 1952   BILIRUBINUR n 06/30/2016 1551   KETONESUR NEGATIVE 10/07/2023 1952   PROTEINUR 30 (A) 10/07/2023 1952   UROBILINOGEN 0.2 06/30/2016 1551   UROBILINOGEN 0.2 07/09/2009 0853   NITRITE NEGATIVE 10/07/2023 1952   LEUKOCYTESUR NEGATIVE 10/07/2023 1952   Sepsis Labs Recent Labs  Lab 10/19/23 0539 10/20/23 0550 10/21/23 0538 10/22/23 0524  WBC 5.2 4.6 5.1 5.9   Microbiology No results found for this or any previous visit (from the past 240 hours).   Time coordinating discharge: Over 30 minutes  SIGNED:   Camellia PARAS [place], DO  [facility] 10/23/2023, 1:03 PM

## 2023-10-23 NOTE — TOC Transition Note (Signed)
 Transition of Care [facility]) - Discharge Note   Patient Details  Name: Tyler Scott MRN: [id number] Date of Birth: 08-Aug-1955  Transition of Care [facility]) CM/SW Contact:  Sheri ONEIDA Sharps, LCSW Phone Number: 10/23/2023, 2:12 PM   Clinical Narrative:    Pt medically ready to dc to [facility]. Call report [phone] room 124a. DC packet left at nurses station. PTAR called at 2:13pm. No further dc needs.    Final next level of care: Skilled Nursing Facility Barriers to Discharge: Barriers Resolved   Patient Goals and CMS Choice Patient states their goals for this hospitalization and ongoing recovery are:: return home following STR   Choice offered to / list presented to : NA      Discharge Placement   Existing PASRR number confirmed : 10/12/23            Patient to be transferred to facility by: PTAR   Patient and family notified of of transfer: 10/23/23  Discharge Plan and Services Additional resources added to the After Visit Summary for   In-house Referral: Clinical Social Work Discharge Planning Services: NA Post Acute Care Choice: Home Health          DME Arranged: N/A DME Agency: NA       HH Arranged: NA HH Agency: NA Date HH Agency Contacted: 10/11/23 Time HH Agency Contacted: 1422 Representative spoke with at [facility] Agency: Jon  Social Drivers of Health (SDOH) Interventions SDOH Screenings   Food Insecurity: No Food Insecurity (10/08/2023)  Housing: Low Risk  (10/08/2023)  Transportation Needs: No Transportation Needs (10/08/2023)  Utilities: Not At Risk (10/08/2023)  Depression (PHQ2-9): Low Risk  (09/14/2023)  Social Connections: Unknown (10/08/2023)  Recent Concern: Social Connections - Moderately Isolated (10/08/2023)  Tobacco Use: Low Risk  (10/07/2023)     Readmission Risk Interventions    10/23/2023    2:09 PM 10/11/2023    2:19 PM 08/30/2023   11:40 AM  Readmission Risk Prevention Plan  Transportation Screening Complete Complete Complete  PCP or  Specialist Appt within 3-5 Days   Complete  HRI or Home Care Consult   Complete  Social Work Consult for Recovery Care Planning/Counseling   Complete  Palliative Care Screening   Complete  Medication Review Oceanographer) Complete Complete Complete  PCP or Specialist appointment within 3-5 days of discharge Complete Complete   HRI or Home Care Consult  Complete   SW Recovery Care/Counseling Consult Complete Complete   Palliative Care Screening Not Applicable Not Applicable   Skilled Nursing Facility Complete Not Applicable

## 2023-10-23 NOTE — Progress Notes (Signed)
 Called report to Tammy at SNF, removed patient's IV , shaved, bathed and dressed patient, picked meds up from pharmacy, packed patient's bag, put charger in patient's front pocket and phone and block in back pocket of patients travel, bag, PTAR to be called by CM, in bed awaiting transport, no acute distress noted

## 2023-10-23 NOTE — Progress Notes (Signed)
 PROGRESS NOTE    Tyler Scott  FMW:[id number] DOB: [birth year] DOA: 10/07/2023 PCP: [facility]    Brief Narrative:   Tyler Scott is a 68 y.o. male with past medical history significant for CLL with recent initiation of chemotherapy, HTN, HLD, DM 1, chronic ITP (baseline Plt 40's) who presented to [facility] ED from the cancer center with complaints of abdominal pain, nausea, poor oral intake, generalized weakness with near syncope and shortness of breath.  Patient was due for next chemotherapy infusion today, 8/28 and when attempting to get to the car had an episode of near syncope with associated shortness of breath and generalized weakness.  EMS was activated in which she was noted to be tachycardic, hypotensive with mild AMS (improved shortly after arrival).  Patient's wife also noted hyperglycemia to the 300s the morning of 10/07/2023 in which she administered insulin .  On ED arrival, patient was afebrile with HR 116, BP 95/67, RR 25, SpO2 94%. Labs were notable for WBC 14.6 (down from 17.1 8/27 with ANC 1100), Hgb 1.7 (8.2), Plt 10 (47). INR > 10 (1 two months PTA). CMP/lipase pending. Trop 46. LA 2.5 > 5.4. VBG 7.21/pCO2 < 18/pO2 < 31/bicarb 5.6. Initial CBG 40. CXR demonstrated small L pleural effusion and L lung base atelectasis with R paratracheal adenoapthyp; CT A/P showed extensive bulky LAD throughout the lower chest, abdomen, pelvis and groin regions (similar from prior), splenic enlargement with peripheral splenic infarct, small lucent lesions in the pelvis c/f lymphomatous involvement, moderate L and small R pleural effusions with atelectasis. Broad-spectrum antibiotics were started, Cx pending. Levophed  started for hypotension. Oncology consulted. PCCM consulted for ICU admission.  Significant hospital events: 8/28: Admit ICU, started on vasopressors, oncology consulted; transfused 2 unit PRBC 8/29: Levophed  titrated off; received rasburicase , 1 unit platelets, 1  unit PRBC transfusion 8/30: 1 unit platelet transfusion 8/31: Transferred to [facility]; 2 unit platelet transfusion 9/1: 1u platelet transfusion, transfer to 6 E. 9/2: transfuse 1u Plt, to receive NPLATE  per oncology  9/8: Received second dose of Nplate  9/12: Platelet count stable, 42 awaiting insurance authorization for SNF   Assessment & Plan:   Shock, undifferentiated Concern for sepsis, ruled out Patient presenting with weakness and noted to be hypotensive, initially admitted to the intensive care unit on vasopressors.  Suspect etiology likely secondary to severe dehydration, poor oral intake with recent chemotherapy infusion as well as tumor lysis syndrome as above.  Initially was started on empiric antibiotics with vancomycin , cefepime  and doxycycline  which were stopped.  Acute hypoxemic respiratory failure: Resolved Supplemental oxygen weaned off.  CLL/SLL/low-grade lymphoma Tumor lysis syndrome Patient initially diagnosed with CLL 2012. Most recently patient received Cycle 1 Bendamustine  (without Rituxan ) on 09/08/23.  Imaging 8/29 with extensive bulky lymphadenopathy throughout the lower chest, abdomen, pelvis and groin similar to previous study also with splenic enlargement, noted bone lesions progressive since prior study.  Uric acid 12.3, received rasburicase  on 8/28. -- Uric acid 12.3>5.1..<1.5 -- Further per oncology  Acute renal failure: Resolved Suspect etiology multifactorial with ATN from hypotension/shock as well as prerenal azotemia from severe dehydration.  No hydronephrosis noted on CT abdomen/pelvis at time of admission.  IV fluids now discontinued. -- Cr 2.48>2.36>>1.58>0.92>0.76>>0.79  Anemia, severe Hemoglobin on arrival 1.7; transfused 3 unit PRBCs. -- Hgb 1.7>9.3>10.0>8.7>9.8 -- Transfuse for hemoglobin less than 7.0 -- CBC daily  Thrombocytopenia, severe Platelet count 10K arrival.  Received 1 unit platelet on 8/29, 8/30, 2 u on 8/31, 1u 9/1, 1u 9/2.  Received  Nplate  on 9/2 and 9/8 -- Plt 10>15>18>>14>18>15>27>18>>21>26>34>>35>42>42 -- Oncology plans weekly Nplate  and PRBC transfusion support starting 9/9; plan to start Venetoclax   -- Outpatient follow-up with Dr. Onesimo  Hypokalemia Hypophosphatemia Hypomagnesia Mia Repleted.    HTN Patient was notably hypotensive/shock as above at time of arrival. -- BP 126/65, stable -- Continue monitor BP closely  HLD -- Zocor  20 mg p.o. daily  Type 1 diabetes mellitus On Jardiance  12.5 mg p.o. daily, Lantus  10-30 units Manly Prn at bedtime at baseline -- Sensitive SSI for coverage  Chronic pain Patient reportedly on MS Contin  15 mg p.o. nightly, gabapentin  100 mg p.o. 3 times daily Dilaudid  2 mg p.o. nightly Robaxin  500 mg p.o. nightly -- Hold home MS Contin  -- Continue gabapentin  100 mg PO TID -- Continue dialudid 2 mg PO at bedtime -- Continue Robaxin  500 mg PO at bedtime  Depression Seen by psychiatry on 9/5.  Started on Zoloft .  Continue doxepin  25 mg p.o. nightly.  Weakness/debility/deconditioning/gait disturbance: -- PT/OT recommending SNF, pending insurance authorization.    DVT prophylaxis: SCDs Start: 10/07/23 1835    Code Status: Full Code Family Communication: No family present bedside this morning  Disposition Plan:  Level of care: Med-Surg Status is: Inpatient Remains inpatient appropriate because: Pending SNF placement    Consultants:  PCCM - singed off 8/31 Medical Oncology Psychiatry  Procedures:  none  Antimicrobials:  Cefepime  8/28 - 8/28 Vancomycin  8/28 - 8/28 Doxycycline  8/28 - 8/30   Subjective: Patient seen examined bedside, lying in bed.  Continues with significant weakness, especially lower extremities.  No family present.  Awaiting SNF insurance authorization for placement.  No other complaints.  Denies headache, no dizziness, no chest pain, no palpitations, no shortness of breath, no abdominal pain, no fever/chills/night sweats, no nausea/vomiting.  No  acute concerns overnight per nursing staff.  Objective: Vitals:   10/23/23 0003 10/23/23 0413 10/23/23 0500 10/23/23 0756  BP: 139/68 131/65  137/70  Pulse: (!) 110 (!) 117  (!) 112  Resp: 18 18  18   Temp: 98.3 F (36.8 C) 98 F (36.7 C)  97.6 F (36.4 C)  TempSrc: Oral   Oral  SpO2: 96% 95%  98%  Weight:   56.9 kg   Height:       No intake or output data in the 24 hours ending 10/23/23 1157  Filed Weights   10/19/23 0500 10/20/23 0707 10/23/23 0500  Weight: 56.4 kg 56.8 kg 56.9 kg    Examination:  Physical Exam: GEN: NAD, alert and oriented x 3, chronically ill/thin in appearance HEENT: NCAT, PERRL, EOMI, sclera clear, MMM PULM: CTAB w/o wheezes/crackles, normal respiratory effort, room air CV: RRR w/o M/G/R GI: abd soft, NTND, + BS MSK: no peripheral edema, LLE with MS 1+/5, RLE 2/5, moves upper extremities independently with preserved muscle strength NEURO: No focal neurological deficit other than strength deficit of bilateral lower extremities as above PSYCH: normal mood/affect Integumentary: No concerning rashes/lesions/wounds noted on exposed skin surfaces    Data Reviewed: I have personally reviewed following labs and imaging studies  CBC: Recent Labs  Lab 10/18/23 0510 10/19/23 0539 10/20/23 0550 10/21/23 0538 10/22/23 0524  WBC 6.5 5.2 4.6 5.1 5.9  NEUTROABS 1.0* 0.8* 0.6* 0.6* 0.6*  HGB 8.9* 8.7* 8.0* 8.0* 8.0*  HCT 27.5* 27.1* 26.0* 25.2* 25.0*  MCV 92.9 92.8 93.5 92.6 94.0  PLT 34* 33* 35* 42* 42*   Basic Metabolic Panel: Recent Labs  Lab 10/17/23 0629 10/19/23 0539  NA 137 135  K 4.2 4.1  CL 104 102  CO2 22 23  GLUCOSE 96 114*  BUN 10 11  CREATININE 0.73 0.79  CALCIUM  8.0* 8.3*  MG 1.8 1.7  PHOS 3.2 3.3   GFR: Estimated Creatinine Clearance: 72.1 mL/min (by C-G formula based on SCr of 0.79 mg/dL). Liver Function Tests: Recent Labs  Lab 10/17/23 0629  AST 28  ALT 12  ALKPHOS 87  BILITOT 1.1  PROT 4.6*  ALBUMIN 3.1*   No  results for input(s): LIPASE, AMYLASE in the last 168 hours.  No results for input(s): AMMONIA in the last 168 hours. Coagulation Profile: No results for input(s): INR, PROTIME in the last 168 hours.  Cardiac Enzymes: No results for input(s): CKTOTAL, CKMB, CKMBINDEX, TROPONINI in the last 168 hours. BNP (last 3 results) No results for input(s): PROBNP in the last 8760 hours. HbA1C: No results for input(s): HGBA1C in the last 72 hours. CBG: Recent Labs  Lab 10/22/23 2025 10/23/23 0005 10/23/23 0415 10/23/23 0758 10/23/23 1126  GLUCAP 157* 155* 154* 163* 195*   Lipid Profile: No results for input(s): CHOL, HDL, LDLCALC, TRIG, CHOLHDL, LDLDIRECT in the last 72 hours. Thyroid  Function Tests: No results for input(s): TSH, T4TOTAL, FREET4, T3FREE, THYROIDAB in the last 72 hours. Anemia Panel: No results for input(s): VITAMINB12, FOLATE, FERRITIN, TIBC, IRON, RETICCTPCT in the last 72 hours. Sepsis Labs: No results for input(s): PROCALCITON, LATICACIDVEN in the last 168 hours.   No results found for this or any previous visit (from the past 240 hours).        Radiology Studies: No results found.      Scheduled Meds:  sodium chloride    Intravenous Once   acyclovir   400 mg Oral Daily   allopurinol   150 mg Oral Daily   Chlorhexidine  Gluconate Cloth  6 each Topical Daily   doxepin   25 mg Oral QHS   feeding supplement  237 mL Oral BID BM   gabapentin   100 mg Oral TID   HYDROmorphone   2 mg Oral QHS   insulin  aspart  0-6 Units Subcutaneous TID WC   methocarbamol   500 mg Oral QHS   metoprolol  tartrate  12.5 mg Oral BID   pantoprazole   40 mg Oral QHS   senna-docusate  1 tablet Oral BID   sertraline   25 mg Oral Daily   simvastatin   20 mg Oral q1800   Continuous Infusions:   LOS: 16 days    Time spent: 46 minutes spent on 10/23/2023 caring for this patient face-to-face including chart review, ordering  labs/tests, documenting, discussion with nursing staff, consultants, updating family and interview/physical exam    Tyler PARAS [place], DO [facility] Available via Epic secure chat 7am-7pm After these hours, please refer to coverage provider listed on [email or web address] 10/23/2023, 11:57 AM

## 2023-10-26 ENCOUNTER — Telehealth: Payer: Self-pay

## 2023-10-26 NOTE — Telephone Encounter (Signed)
 [facility] CSW Progress Note  Clinical Social Worker contacted patient post hospital discharge to SNF. Patient previously desired getting connected to clinical services for adjustment to diagnosis, patient got connected to psych during inpatient stay. Patient / CSW briefly discussed clinical services and appropriateness during SNF placement. Patient stated he has access to clinical services at SNF and feels comfortable with postponing clinical services at cancer center until those conclude. CSW agreed. CSW will still plan to see patient at next visit. If needs change, he can be reevaluated for clinical counseling.    Follow Up Plan:  CSW will see patient on 9/24    Lizbeth Sprague, LCSW Clinical Social Worker [facility]

## 2023-10-27 ENCOUNTER — Inpatient Hospital Stay

## 2023-10-29 NOTE — Telephone Encounter (Signed)
 Attempted to call patient in regards to the venetoclax  medication. Will attempt again on Monday.  Kaybree Williams, PharmD Hematology/Oncology Clinical Pharmacist [facility] [phone]

## 2023-11-01 ENCOUNTER — Other Ambulatory Visit: Payer: Self-pay

## 2023-11-01 ENCOUNTER — Encounter (HOSPITAL_COMMUNITY): Payer: Self-pay | Admitting: Internal Medicine

## 2023-11-01 ENCOUNTER — Inpatient Hospital Stay (HOSPITAL_COMMUNITY)
Admission: EM | Admit: 2023-11-01 | Discharge: 2023-11-08 | DRG: 811 | Disposition: A | Discharge status: Hospice | Source: Ambulatory Visit | Attending: Internal Medicine | Admitting: Internal Medicine

## 2023-11-01 DIAGNOSIS — R64 Cachexia: Secondary | ICD-10-CM | POA: Diagnosis present

## 2023-11-01 DIAGNOSIS — D649 Anemia, unspecified: Secondary | ICD-10-CM | POA: Diagnosis present

## 2023-11-01 DIAGNOSIS — K6812 Psoas muscle abscess: Secondary | ICD-10-CM | POA: Diagnosis present

## 2023-11-01 DIAGNOSIS — I10 Essential (primary) hypertension: Secondary | ICD-10-CM | POA: Diagnosis present

## 2023-11-01 DIAGNOSIS — C9110 Chronic lymphocytic leukemia of B-cell type not having achieved remission: Secondary | ICD-10-CM | POA: Diagnosis present

## 2023-11-01 DIAGNOSIS — E10319 Type 1 diabetes mellitus with unspecified diabetic retinopathy without macular edema: Secondary | ICD-10-CM | POA: Diagnosis present

## 2023-11-01 DIAGNOSIS — R54 Age-related physical debility: Secondary | ICD-10-CM | POA: Diagnosis present

## 2023-11-01 DIAGNOSIS — Z860101 Personal history of adenomatous and serrated colon polyps: Secondary | ICD-10-CM

## 2023-11-01 DIAGNOSIS — Z800 Family history of malignant neoplasm of digestive organs: Secondary | ICD-10-CM

## 2023-11-01 DIAGNOSIS — R7402 Elevation of levels of lactic acid dehydrogenase (LDH): Secondary | ICD-10-CM | POA: Diagnosis present

## 2023-11-01 DIAGNOSIS — Z833 Family history of diabetes mellitus: Secondary | ICD-10-CM

## 2023-11-01 DIAGNOSIS — Z7984 Long term (current) use of oral hypoglycemic drugs: Secondary | ICD-10-CM

## 2023-11-01 DIAGNOSIS — M1990 Unspecified osteoarthritis, unspecified site: Secondary | ICD-10-CM | POA: Insufficient documentation

## 2023-11-01 DIAGNOSIS — E559 Vitamin D deficiency, unspecified: Secondary | ICD-10-CM | POA: Diagnosis present

## 2023-11-01 DIAGNOSIS — D6481 Anemia due to antineoplastic chemotherapy: Secondary | ICD-10-CM | POA: Diagnosis not present

## 2023-11-01 DIAGNOSIS — G893 Neoplasm related pain (acute) (chronic): Secondary | ICD-10-CM | POA: Diagnosis present

## 2023-11-01 DIAGNOSIS — E561 Deficiency of vitamin K: Secondary | ICD-10-CM | POA: Diagnosis present

## 2023-11-01 DIAGNOSIS — D693 Immune thrombocytopenic purpura: Secondary | ICD-10-CM | POA: Diagnosis present

## 2023-11-01 DIAGNOSIS — E872 Acidosis, unspecified: Secondary | ICD-10-CM | POA: Diagnosis present

## 2023-11-01 DIAGNOSIS — D508 Other iron deficiency anemias: Principal | ICD-10-CM

## 2023-11-01 DIAGNOSIS — E875 Hyperkalemia: Secondary | ICD-10-CM | POA: Diagnosis present

## 2023-11-01 DIAGNOSIS — E739 Lactose intolerance, unspecified: Secondary | ICD-10-CM | POA: Diagnosis present

## 2023-11-01 DIAGNOSIS — Z79899 Other long term (current) drug therapy: Secondary | ICD-10-CM

## 2023-11-01 DIAGNOSIS — E1065 Type 1 diabetes mellitus with hyperglycemia: Secondary | ICD-10-CM | POA: Diagnosis present

## 2023-11-01 DIAGNOSIS — Z66 Do not resuscitate: Secondary | ICD-10-CM | POA: Diagnosis present

## 2023-11-01 DIAGNOSIS — E785 Hyperlipidemia, unspecified: Secondary | ICD-10-CM | POA: Diagnosis present

## 2023-11-01 DIAGNOSIS — Z7409 Other reduced mobility: Secondary | ICD-10-CM | POA: Diagnosis present

## 2023-11-01 DIAGNOSIS — R531 Weakness: Secondary | ICD-10-CM | POA: Diagnosis not present

## 2023-11-01 DIAGNOSIS — E119 Type 2 diabetes mellitus without complications: Secondary | ICD-10-CM

## 2023-11-01 DIAGNOSIS — R627 Adult failure to thrive: Secondary | ICD-10-CM | POA: Diagnosis present

## 2023-11-01 DIAGNOSIS — Z794 Long term (current) use of insulin: Secondary | ICD-10-CM

## 2023-11-01 DIAGNOSIS — Z515 Encounter for palliative care: Secondary | ICD-10-CM

## 2023-11-01 DIAGNOSIS — N179 Acute kidney failure, unspecified: Secondary | ICD-10-CM | POA: Diagnosis present

## 2023-11-01 DIAGNOSIS — L89150 Pressure ulcer of sacral region, unstageable: Secondary | ICD-10-CM | POA: Diagnosis present

## 2023-11-01 DIAGNOSIS — Z681 Body mass index (BMI) 19 or less, adult: Secondary | ICD-10-CM

## 2023-11-01 DIAGNOSIS — T451X5A Adverse effect of antineoplastic and immunosuppressive drugs, initial encounter: Secondary | ICD-10-CM | POA: Diagnosis present

## 2023-11-01 DIAGNOSIS — Z7189 Other specified counseling: Secondary | ICD-10-CM

## 2023-11-01 DIAGNOSIS — N400 Enlarged prostate without lower urinary tract symptoms: Secondary | ICD-10-CM | POA: Diagnosis present

## 2023-11-01 DIAGNOSIS — L8990 Pressure ulcer of unspecified site, unspecified stage: Secondary | ICD-10-CM | POA: Diagnosis present

## 2023-11-01 DIAGNOSIS — C8590 Non-Hodgkin lymphoma, unspecified, unspecified site: Secondary | ICD-10-CM | POA: Diagnosis present

## 2023-11-01 DIAGNOSIS — E43 Unspecified severe protein-calorie malnutrition: Secondary | ICD-10-CM | POA: Diagnosis present

## 2023-11-01 DIAGNOSIS — N1330 Unspecified hydronephrosis: Secondary | ICD-10-CM | POA: Diagnosis not present

## 2023-11-01 DIAGNOSIS — F419 Anxiety disorder, unspecified: Secondary | ICD-10-CM | POA: Diagnosis present

## 2023-11-01 DIAGNOSIS — R197 Diarrhea, unspecified: Secondary | ICD-10-CM | POA: Diagnosis present

## 2023-11-01 DIAGNOSIS — L89153 Pressure ulcer of sacral region, stage 3: Secondary | ICD-10-CM | POA: Diagnosis present

## 2023-11-01 LAB — URINALYSIS, ROUTINE W REFLEX MICROSCOPIC
Bilirubin Urine: NEGATIVE
Glucose, UA: 150 mg/dL — AB
Hgb urine dipstick: NEGATIVE
Ketones, ur: NEGATIVE mg/dL
Nitrite: NEGATIVE
Protein, ur: NEGATIVE mg/dL
Specific Gravity, Urine: 1.014 (ref 1.005–1.030)
pH: 5 (ref 5.0–8.0)

## 2023-11-01 LAB — COMPREHENSIVE METABOLIC PANEL WITH GFR
ALT: 18 U/L (ref 0–44)
AST: 23 U/L (ref 15–41)
Albumin: 3.3 g/dL — ABNORMAL LOW (ref 3.5–5.0)
Alkaline Phosphatase: 77 U/L (ref 38–126)
Anion gap: 15 (ref 5–15)
BUN: 62 mg/dL — ABNORMAL HIGH (ref 8–23)
CO2: 17 mmol/L — ABNORMAL LOW (ref 22–32)
Calcium: 8.2 mg/dL — ABNORMAL LOW (ref 8.9–10.3)
Chloride: 103 mmol/L (ref 98–111)
Creatinine, Ser: 1.34 mg/dL — ABNORMAL HIGH (ref 0.61–1.24)
GFR, Estimated: 58 mL/min — ABNORMAL LOW (ref >60)
Glucose, Bld: 359 mg/dL — ABNORMAL HIGH (ref 70–99)
Potassium: 5.7 mmol/L — ABNORMAL HIGH (ref 3.5–5.1)
Sodium: 134 mmol/L — ABNORMAL LOW (ref 135–145)
Total Bilirubin: 0.9 mg/dL (ref 0.0–1.2)
Total Protein: 5.3 g/dL — ABNORMAL LOW (ref 6.5–8.1)

## 2023-11-01 LAB — CBC
HCT: 16.7 % — ABNORMAL LOW (ref 39.0–52.0)
Hemoglobin: 5.2 g/dL — CL (ref 13.0–17.0)
MCH: 29.2 pg (ref 26.0–34.0)
MCHC: 31.1 g/dL (ref 30.0–36.0)
MCV: 93.8 fL (ref 80.0–100.0)
Platelets: 76 K/uL — ABNORMAL LOW (ref 150–400)
RBC: 1.78 MIL/uL — ABNORMAL LOW (ref 4.22–5.81)
RDW: 18.2 % — ABNORMAL HIGH (ref 11.5–15.5)
WBC: 4.6 K/uL (ref 4.0–10.5)
nRBC: 0 % (ref 0.0–0.2)

## 2023-11-01 LAB — GLUCOSE, CAPILLARY: Glucose-Capillary: 337 mg/dL — ABNORMAL HIGH (ref 70–99)

## 2023-11-01 LAB — CBG MONITORING, ED: Glucose-Capillary: 346 mg/dL — ABNORMAL HIGH (ref 70–99)

## 2023-11-01 LAB — PREPARE RBC (CROSSMATCH)

## 2023-11-01 MED ORDER — LOPERAMIDE HCL 2 MG PO CAPS
2.0000 mg | ORAL_CAPSULE | Freq: Once | ORAL | Status: AC
  Administered 2023-11-01: 2 mg via ORAL
  Filled 2023-11-01: qty 1

## 2023-11-01 MED ORDER — SODIUM CHLORIDE 0.9% IV SOLUTION: Freq: Once | INTRAVENOUS | Status: DC

## 2023-11-01 MED ORDER — ACYCLOVIR 400 MG PO TABS
400.0000 mg | ORAL_TABLET | Freq: Every day | ORAL | Status: DC
Start: 2023-11-02 — End: 2023-11-06
  Administered 2023-11-02 – 2023-11-06 (×5): 400 mg via ORAL
  Filled 2023-11-01 (×5): qty 1

## 2023-11-01 MED ORDER — SERTRALINE HCL 25 MG PO TABS
25.0000 mg | ORAL_TABLET | Freq: Every day | ORAL | Status: DC
  Administered 2023-11-02 – 2023-11-08 (×7): 25 mg via ORAL
  Filled 2023-11-01 (×7): qty 1

## 2023-11-01 MED ORDER — SODIUM ZIRCONIUM CYCLOSILICATE 10 G PO PACK
10.0000 g | PACK | Freq: Once | ORAL | Status: AC
  Administered 2023-11-01: 10 g via ORAL
  Filled 2023-11-01: qty 1

## 2023-11-01 MED ORDER — HYDROMORPHONE HCL 2 MG PO TABS
2.0000 mg | ORAL_TABLET | Freq: Every day | ORAL | Status: DC
Start: 2023-11-01 — End: 2023-11-08
  Administered 2023-11-01 – 2023-11-07 (×6): 2 mg via ORAL
  Filled 2023-11-01 (×7): qty 1

## 2023-11-01 MED ORDER — TRAMADOL HCL 50 MG PO TABS
50.0000 mg | ORAL_TABLET | Freq: Four times a day (QID) | ORAL | Status: DC | PRN
  Administered 2023-11-02 – 2023-11-05 (×4): 50 mg via ORAL
  Filled 2023-11-01 (×4): qty 1

## 2023-11-01 MED ORDER — HYDROMORPHONE HCL 1 MG/ML IJ SOLN
0.5000 mg | INTRAMUSCULAR | Status: DC | PRN
  Administered 2023-11-03 – 2023-11-06 (×2): 0.5 mg via INTRAVENOUS
  Filled 2023-11-01 (×3): qty 0.5

## 2023-11-01 MED ORDER — SODIUM CHLORIDE 0.9 % IV BOLUS
1000.0000 mL | Freq: Once | INTRAVENOUS | Status: AC
  Administered 2023-11-01: 1000 mL via INTRAVENOUS

## 2023-11-01 MED ORDER — ONDANSETRON HCL 4 MG/2ML IJ SOLN: 4.0000 mg | Freq: Four times a day (QID) | INTRAMUSCULAR | Status: DC | PRN

## 2023-11-01 MED ORDER — INSULIN ASPART 100 UNIT/ML IJ SOLN
0.0000 [IU] | Freq: Three times a day (TID) | INTRAMUSCULAR | Status: DC
  Administered 2023-11-02: 7 [IU] via SUBCUTANEOUS

## 2023-11-01 MED ORDER — ALLOPURINOL 300 MG PO TABS
300.0000 mg | ORAL_TABLET | Freq: Every day | ORAL | Status: DC
  Administered 2023-11-02 – 2023-11-08 (×7): 300 mg via ORAL
  Filled 2023-11-01 (×7): qty 1

## 2023-11-01 MED ORDER — GABAPENTIN 100 MG PO CAPS
100.0000 mg | ORAL_CAPSULE | Freq: Three times a day (TID) | ORAL | Status: DC
Start: 2023-11-01 — End: 2023-11-08
  Administered 2023-11-01 – 2023-11-08 (×21): 100 mg via ORAL
  Filled 2023-11-01 (×22): qty 1

## 2023-11-01 MED ORDER — PANTOPRAZOLE SODIUM 40 MG PO TBEC
40.0000 mg | DELAYED_RELEASE_TABLET | Freq: Every day | ORAL | Status: DC
  Administered 2023-11-01 – 2023-11-07 (×7): 40 mg via ORAL
  Filled 2023-11-01 (×7): qty 1

## 2023-11-01 MED ORDER — ACETAMINOPHEN 650 MG RE SUPP: 650.0000 mg | Freq: Four times a day (QID) | RECTAL | Status: DC | PRN

## 2023-11-01 MED ORDER — ONDANSETRON HCL 4 MG PO TABS: 4.0000 mg | ORAL_TABLET | Freq: Four times a day (QID) | ORAL | Status: DC | PRN

## 2023-11-01 MED ORDER — ACETAMINOPHEN 325 MG PO TABS: 650.0000 mg | ORAL_TABLET | Freq: Four times a day (QID) | ORAL | Status: DC | PRN

## 2023-11-01 MED ORDER — METOPROLOL TARTRATE 25 MG PO TABS
12.5000 mg | ORAL_TABLET | Freq: Two times a day (BID) | ORAL | Status: DC
Start: 2023-11-01 — End: 2023-11-04
  Administered 2023-11-01 – 2023-11-03 (×5): 12.5 mg via ORAL
  Filled 2023-11-01 (×6): qty 1

## 2023-11-01 NOTE — H&P (Signed)
 History and Physical    Patient: Tyler Scott FMW:[id number] DOB: Apr 22, 1955 DOA: 11/01/2023 DOS: the patient was seen and examined on 11/01/2023 PCP: [facility]  Patient coming from: Home  Chief Complaint:  Chief Complaint  Patient presents with   Weakness   HPI: Tyler Scott is a 68 y.o. male with medical history significant of anemia, osteoarthritis, BPH, CLL, low-grade lymphoma, chronic ITP, pancytopenia, tumor lysis syndrome, diabetic retinopathy, insulin  requiring type 2 diabetes, essential hypertension, hyperlipidemia, pressure injury of skin, vitamin K deficiency who was sent to the emergency department from [facility] health care clinic due to generalized weakness and abnormal labs. He had 3 episodes of diarrhea in the last 2 days associated with mild abdominal discomfort.  He has been using MiraLAX . No  nausea, emesis, constipation, melena or hematochezia.  No flank pain, dysuria, frequency or hematuria.He denied fever, chills, rhinorrhea, sore throat, wheezing or hemoptysis.  No chest pain, palpitations, diaphoresis, PND, orthopnea or pitting edema of the lower extremities.   No polyuria, polydipsia, polyphagia or blurred vision.   Lab work: Urinalysis showed glucose of 150 mg/dL, small leukocyte esterase and rare bacteria.  CBC showed a white count of 4.6, hemoglobin 5.2 g/dL platelets 76.  CMP showed potassium of 5.7 and CO2 17 mmol/L with a normal anion gap.  The rest of the electrolytes are normal after sodium and calcium  correction.  Glucose 359, BUN 62 and creatinine 1.34 mg/dL.  Total protein 5.3 and albumin 3.3 g/dL, the rest of the LFTs were normal.   ED course: Initial vital signs were temperature 98.3 F, pulse 94, respiration 19, BP 130/60 mmHg and O2 sat 100% on room air.  The patient received 1000 mL of normal saline bolus, Lokelma  10 g p.o. x 1 and 2 units of PRBC.  Review of Systems: As mentioned in the history of present illness. All other  systems reviewed and are negative. Past Medical History:  Diagnosis Date   Allergy    Arthritis    CLL (chronic lymphocytic leukemia) (HCC)    stage III: asymptomatic; on observation   DIABETES MELLITUS, TYPE I, UNCONTROLLED 12/30/2006   insulin    Diabetic retinopathy    ERECTILE DYSFUNCTION, ORGANIC 12/30/2006   Hx of adenomatous polyp of colon 05/24/2014   HYPERLIPIDEMIA 12/30/2006   HYPERTENSION 12/30/2006   LOW BACK PAIN, ACUTE 12/21/2006   Past Surgical History:  Procedure Laterality Date   COLONOSCOPY     great right toe- Fused the I P joint     Social History:  reports that he has never smoked. He has never used smokeless tobacco. He reports current alcohol  use of about 4.0 standard drinks of alcohol  per week. He reports that he does not use drugs.  No Known Allergies  Family History  Problem Relation Age of Onset   Alcohol  abuse Other    Colon cancer Brother 27       Died 10-28-2013   Diabetes Mellitus II Mother    Esophageal cancer Neg Hx    Rectal cancer Neg Hx    Stomach cancer Neg Hx     Prior to Admission medications   Medication Sig Start Date End Date Taking? Authorizing Provider  acyclovir  (ZOVIRAX ) 400 MG tablet Take 1 tablet (400 mg total) by mouth daily. 08/16/23   Onesimo Emaline Brink, MD  allopurinol  (ZYLOPRIM ) 300 MG tablet Take 150 mg by mouth daily.    [provider]  Cholecalciferol 50 MCG (2000 UT) TABS TAKE ONE TABLET BY MOUTH DAILY  FOR LOW VITAMIN D . 04/29/20   [provider]  dexamethasone  (DECADRON ) 4 MG tablet Take 4 mg by mouth daily. For 2 days after chemo    [provider]  doxepin  (SINEQUAN ) 25 MG capsule Take 1 capsule by mouth at bedtime. 11/29/20   [provider]  empagliflozin  (JARDIANCE ) 25 MG TABS tablet Take 0.5 tablets by mouth daily as needed (high blood sugar). 05/30/21   [provider]  gabapentin  (NEURONTIN ) 100 MG capsule Take 1 capsule (100 mg total) by mouth 3 (three) times daily.  09/02/23   Shalhoub, Zachary PARAS, MD  HYDROmorphone  (DILAUDID ) 2 MG tablet Take 1 tablet (2 mg total) by mouth at bedtime. 10/23/23 11/22/23  [place], Eric J, DO  insulin  aspart (NOVOLOG ) 100 UNIT/ML injection Inject 0-6 Units into the skin 3 (three) times daily with meals. 10/23/23   [place], Eric J, DO  methocarbamol  (ROBAXIN ) 500 MG tablet Take 1 tablet (500 mg total) by mouth at bedtime. 10/23/23   [place], Camellia PARAS, DO  metoprolol  tartrate (LOPRESSOR ) 25 MG tablet Take 0.5 tablets (12.5 mg total) by mouth 2 (two) times daily. 10/23/23   [place], Camellia PARAS, DO  ondansetron  (ZOFRAN ) 8 MG tablet Take 1 tablet (8 mg total) by mouth every 8 (eight) hours as needed for nausea or vomiting. Start on the third day after chemotherapy. Patient taking differently: Take 8 mg by mouth daily as needed for nausea or vomiting. 08/16/23   Onesimo Emaline Brink, MD  ONE [facility] ULTRA TEST test strip TEST GLUCOSE 3 TIMES A DAY 10/07/16   Krystal Reyes LABOR, MD  [facility] DELICA LANCETS 33G MISC TEST GLUCOSE 3 TIMES A DAY 10/07/16   Krystal Reyes LABOR, MD  pantoprazole  (PROTONIX ) 40 MG tablet Take 1 tablet (40 mg total) by mouth at bedtime. 10/23/23   [place], Camellia PARAS, DO  polyethylene glycol powder (GLYCOLAX /MIRALAX ) 17 GM/SCOOP powder Take 17 g dissolved in liquid by mouth daily. 09/03/23   Shalhoub, Zachary PARAS, MD  senna-docusate (SENOKOT-S) 8.6-50 MG tablet Take 1 tablet by mouth 2 (two) times daily. 10/23/23   [place], Camellia PARAS, DO  sertraline  (ZOLOFT ) 25 MG tablet Take 1 tablet (25 mg total) by mouth daily. 10/24/23   [place], Camellia PARAS, DO  simvastatin  (ZOCOR ) 20 MG tablet TAKE 1 TABLET (20 MG TOTAL) BY MOUTH AT BEDTIME. 06/30/16   Krystal Reyes LABOR, MD  traMADol  (ULTRAM ) 50 MG tablet Take 1 tablet (50 mg total) by mouth every 6 (six) hours as needed for moderate pain (pain score 4-6). 10/23/23   [place], Camellia PARAS, DO  venetoclax  (VENCLEXTA ) 10 & 50 & 100 MG Starter Pack Take by mouth daily. Take 20 mg for 7 days, then 50 mg daily x 7d, then 100 mg daily  x 7d, then 200 mg daily x 7d. Take with food & water . 10/13/23   Onesimo Emaline Brink, MD    Physical Exam: Vitals:   11/01/23 1526 11/01/23 1530 11/01/23 1545 11/01/23 1600  BP:  (!) 123/59  132/69  Pulse:  (!) 102 (!) 101 (!) 104  Resp:  (!) 21 (!) 22 (!) 24  Temp:      TempSrc:      SpO2:  100% 100% 100%  Weight: 58.5 kg     Height: 5' 8 (1.727 m)      Physical Exam Vitals and nursing note reviewed.  Constitutional:      General: He is awake. He is not in acute distress.    Appearance: He is ill-appearing.  HENT:     Head: Normocephalic.     Nose: No rhinorrhea.     Mouth/Throat:     Mouth: Mucous membranes are dry.  Eyes:     General: No scleral icterus.    Pupils: Pupils are equal, round, and reactive to light.  Neck:     Vascular: No JVD.  Cardiovascular:     Rate and Rhythm: Normal rate and regular rhythm.     Heart sounds: S1 normal and S2 normal.  Pulmonary:     Effort: Pulmonary effort is normal.     Breath sounds: Normal breath sounds. No wheezing, rhonchi or rales.  Abdominal:     General: Bowel sounds are normal. There is no distension.     Palpations: Abdomen is soft.     Tenderness: There is no abdominal tenderness. There is no right CVA tenderness or left CVA tenderness.  Musculoskeletal:     Cervical back: Neck supple.     Right lower leg: No edema.     Left lower leg: Edema present.  Skin:    General: Skin is warm and dry.     Coloration: Skin is pale.  Neurological:     General: No focal deficit present.     Mental Status: He is alert and oriented to person, place, and time.  Psychiatric:        Mood and Affect: Mood normal.        Behavior: Behavior normal. Behavior is cooperative.     Data Reviewed:  Results are pending, will review when available.  Assessment and Plan: Principal Problem:   Symptomatic anemia Observation/telemetry. Follow-up post transfusion H&H. Transfuse further as needed. Following with hematology.  Active  Problems:   Hyperkalemia Received 1000 mL of IV fluid bolus. Lokelma  10 g p.o. x 1 dose given. Follow-up potassium level in AM.    Diarrhea  Loperamide  2 mg p.o. x 1 dose. Patient told to hold MiraLAX .    Lymphoma, low grade (HCC)   CLL (chronic lymphocytic leukemia) (HCC) Follow-up for hematology oncology as scheduled.    Chronic ITP (idiopathic thrombocytopenia) (HCC) Monitor platelet count closely.    Essential hypertension Metoprolol  12.5 mg p.o. twice daily..    Hyperlipidemia Not sure if still on simvastatin . Follow-up with primary care provider    Insulin  dependent type 2 diabetes mellitus (HCC) Carbohydrate modified diet. CBG monitoring with RI SS. Check hemoglobin A1c.    Cancer associated pain Continue tramadol  as needed. Continue hydromorphone  2 mg p.o. bedtime. Will add as needed IV hydromorphone .    Pressure injury of skin Follow-up at the wound care clinic. Will continue local care while in the hospital.    Advance Care Planning:   Code Status: Full Code   Consults:   Family Communication:   Severity of Illness: The appropriate patient status for this patient is OBSERVATION. Observation status is judged to be reasonable and necessary in order to provide the required intensity of service to ensure the patient's safety. The patient's presenting symptoms, physical exam findings, and initial radiographic and laboratory data in the context of their medical condition is felt to place them at decreased risk for further clinical deterioration. Furthermore, it is anticipated that the patient will be medically stable for discharge from the hospital within 2 midnights of admission.   Author: Alm Dorn Castor, MD 11/01/2023 5:21 PM  For on call review [email or web address].   This document was prepared using Dragon voice recognition software and may contain some unintended transcription errors.

## 2023-11-01 NOTE — ED Triage Notes (Addendum)
 Patient arrived via [facility] from [facility] for abnormal labs per EMS.  Patient states that he has has generalized weakness for weeks that has gotten worse. Patient also has a wound sacrum that was changed today.

## 2023-11-01 NOTE — ED Provider Notes (Signed)
 [facility] EMERGENCY DEPARTMENT AT [facility] Provider Note   CSN: [id number] Arrival date & time: 11/01/23  1504     Patient presents with: Weakness   Tyler Scott is a 68 y.o. male.  {Add pertinent medical, surgical, social history, OB history to YEP:67052} Patient has a history of CLL and diabetes.  He recently was in the hospital for anemia and AKI.  Patient was sent back to the emergency department for weakness.  He had labs done at the nursing home before the patient was transferred to the emergency department   Weakness      Prior to Admission medications   Medication Sig Start Date End Date Taking? Authorizing Provider  acyclovir  (ZOVIRAX ) 400 MG tablet Take 1 tablet (400 mg total) by mouth daily. 08/16/23   Onesimo Emaline Brink, MD  allopurinol  (ZYLOPRIM ) 300 MG tablet Take 150 mg by mouth daily.    [provider]  Cholecalciferol 50 MCG (2000 UT) TABS TAKE ONE TABLET BY MOUTH DAILY FOR LOW VITAMIN D . 04/29/20   [provider]  dexamethasone  (DECADRON ) 4 MG tablet Take 4 mg by mouth daily. For 2 days after chemo    [provider]  doxepin  (SINEQUAN ) 25 MG capsule Take 1 capsule by mouth at bedtime. 11/29/20   [provider]  empagliflozin  (JARDIANCE ) 25 MG TABS tablet Take 0.5 tablets by mouth daily as needed (high blood sugar). 05/30/21   [provider]  gabapentin  (NEURONTIN ) 100 MG capsule Take 1 capsule (100 mg total) by mouth 3 (three) times daily. 09/02/23   Shalhoub, Zachary PARAS, MD  HYDROmorphone  (DILAUDID ) 2 MG tablet Take 1 tablet (2 mg total) by mouth at bedtime. 10/23/23 11/22/23  [place], Eric J, DO  insulin  aspart (NOVOLOG ) 100 UNIT/ML injection Inject 0-6 Units into the skin 3 (three) times daily with meals. 10/23/23   [place], Camellia PARAS, DO  methocarbamol  (ROBAXIN ) 500 MG tablet Take 1 tablet (500 mg total) by mouth at bedtime. 10/23/23   [place], Camellia PARAS, DO  metoprolol  tartrate (LOPRESSOR ) 25 MG tablet  Take 0.5 tablets (12.5 mg total) by mouth 2 (two) times daily. 10/23/23   [place], Camellia PARAS, DO  ondansetron  (ZOFRAN ) 8 MG tablet Take 1 tablet (8 mg total) by mouth every 8 (eight) hours as needed for nausea or vomiting. Start on the third day after chemotherapy. Patient taking differently: Take 8 mg by mouth daily as needed for nausea or vomiting. 08/16/23   Onesimo Emaline Brink, MD  ONE [facility] ULTRA TEST test strip TEST GLUCOSE 3 TIMES A DAY 10/07/16   Krystal Reyes LABOR, MD  [facility] DELICA LANCETS 33G MISC TEST GLUCOSE 3 TIMES A DAY 10/07/16   Krystal Reyes LABOR, MD  pantoprazole  (PROTONIX ) 40 MG tablet Take 1 tablet (40 mg total) by mouth at bedtime. 10/23/23   [place], Camellia PARAS, DO  polyethylene glycol powder (GLYCOLAX /MIRALAX ) 17 GM/SCOOP powder Take 17 g dissolved in liquid by mouth daily. 09/03/23   Shalhoub, Zachary PARAS, MD  senna-docusate (SENOKOT-S) 8.6-50 MG tablet Take 1 tablet by mouth 2 (two) times daily. 10/23/23   [place], Camellia PARAS, DO  sertraline  (ZOLOFT ) 25 MG tablet Take 1 tablet (25 mg total) by mouth daily. 10/24/23   [place], Camellia PARAS, DO  simvastatin  (ZOCOR ) 20 MG tablet TAKE 1 TABLET (20 MG TOTAL) BY MOUTH AT BEDTIME. 06/30/16   Krystal Reyes LABOR, MD  traMADol  (ULTRAM ) 50 MG tablet Take 1 tablet (50 mg total) by mouth every 6 (six) hours as needed  for moderate pain (pain score 4-6). 10/23/23   [place], Camellia PARAS, DO  venetoclax  (VENCLEXTA ) 10 & 50 & 100 MG Starter Pack Take by mouth daily. Take 20 mg for 7 days, then 50 mg daily x 7d, then 100 mg daily x 7d, then 200 mg daily x 7d. Take with food & water . 10/13/23   Onesimo Emaline Brink, MD    Allergies: Patient has no known allergies.    Review of Systems  Neurological:  Positive for weakness.    Updated Vital Signs BP 132/69   Pulse (!) 104   Temp 98.3 F (36.8 C) (Oral)   Resp (!) 24   Ht 5' 8 (1.727 m)   Wt 58.5 kg   SpO2 100%   BMI 19.61 kg/m   Physical Exam  (all labs ordered are listed, but only abnormal results are displayed) Labs  Reviewed  COMPREHENSIVE METABOLIC PANEL WITH GFR - Abnormal; Notable for the following components:      Result Value   Sodium 134 (*)    Potassium 5.7 (*)    CO2 17 (*)    Glucose, Bld 359 (*)    BUN 62 (*)    Creatinine, Ser 1.34 (*)    Calcium  8.2 (*)    Total Protein 5.3 (*)    Albumin 3.3 (*)    GFR, Estimated 58 (*)    All other components within normal limits  CBC - Abnormal; Notable for the following components:   RBC 1.78 (*)    Hemoglobin 5.2 (*)    HCT 16.7 (*)    RDW 18.2 (*)    Platelets 76 (*)    All other components within normal limits  URINALYSIS, ROUTINE W REFLEX MICROSCOPIC - Abnormal; Notable for the following components:   Glucose, UA 150 (*)    Leukocytes,Ua SMALL (*)    Bacteria, UA RARE (*)    All other components within normal limits  CBG MONITORING, ED - Abnormal; Notable for the following components:   Glucose-Capillary 346 (*)    All other components within normal limits  POC OCCULT BLOOD, ED  TYPE AND SCREEN  PREPARE RBC (CROSSMATCH)   CRITICAL CARE Performed by: Fairy Sermon Total critical care time: 45 minutes Critical care time was exclusive of separately billable procedures and treating other patients. Critical care was necessary to treat or prevent imminent or life-threatening deterioration. Critical care was time spent personally by me on the following activities: development of treatment plan with patient and/or surrogate as well as nursing, discussions with consultants, evaluation of patient's response to treatment, examination of patient, obtaining history from patient or surrogate, ordering and performing treatments and interventions, ordering and review of laboratory studies, ordering and review of radiographic studies, pulse oximetry and re-evaluation of patient's condition.  EKG: None  Radiology: No results found.  {Document cardiac monitor, telemetry assessment procedure when appropriate:32947} Procedures   Medications  Ordered in the ED  sodium zirconium cyclosilicate  (LOKELMA ) packet 10 g (has no administration in time range)  0.9 %  sodium chloride  infusion (Manually program via Guardrails IV Fluids) (has no administration in time range)  sodium chloride  0.9 % bolus 1,000 mL (1,000 mLs Intravenous New Bag/Given 11/01/23 1657)      {Click here for ABCD2, HEART and other calculators REFRESH Note before signing:1}                              Medical Decision Making Amount and/or  Complexity of Data Reviewed Labs: ordered. ECG/medicine tests: ordered.  Risk Prescription drug management. Decision regarding hospitalization.   Patient is admitted for AKI and anemia.  {Document critical care time when appropriate  Document review of labs and clinical decision tools ie CHADS2VASC2, etc  Document your independent review of radiology images and any outside records  Document your discussion with family members, caretakers and with consultants  Document social determinants of health affecting pt's care  Document your decision making why or why not admission, treatments were needed:32947:::1}   Final diagnoses:  Other iron deficiency anemia  CLL (chronic lymphocytic leukemia) [facility])    ED Discharge Orders     None

## 2023-11-01 NOTE — ED Notes (Addendum)
 Patient with  and multiple wounds to penis, scrotum, and perineum in addition to sacral wound. Wound cleanser, foam dressing applied and peri care completed after bowel movement.

## 2023-11-02 ENCOUNTER — Encounter (HOSPITAL_COMMUNITY): Payer: Self-pay | Admitting: General Surgery

## 2023-11-02 DIAGNOSIS — D693 Immune thrombocytopenic purpura: Secondary | ICD-10-CM | POA: Diagnosis present

## 2023-11-02 DIAGNOSIS — R531 Weakness: Secondary | ICD-10-CM | POA: Diagnosis present

## 2023-11-02 DIAGNOSIS — G893 Neoplasm related pain (acute) (chronic): Secondary | ICD-10-CM | POA: Diagnosis present

## 2023-11-02 DIAGNOSIS — L0291 Cutaneous abscess, unspecified: Secondary | ICD-10-CM | POA: Diagnosis not present

## 2023-11-02 DIAGNOSIS — E1065 Type 1 diabetes mellitus with hyperglycemia: Secondary | ICD-10-CM | POA: Diagnosis present

## 2023-11-02 DIAGNOSIS — R64 Cachexia: Secondary | ICD-10-CM | POA: Diagnosis present

## 2023-11-02 DIAGNOSIS — C9110 Chronic lymphocytic leukemia of B-cell type not having achieved remission: Secondary | ICD-10-CM | POA: Diagnosis present

## 2023-11-02 DIAGNOSIS — E875 Hyperkalemia: Secondary | ICD-10-CM | POA: Diagnosis present

## 2023-11-02 DIAGNOSIS — L89153 Pressure ulcer of sacral region, stage 3: Secondary | ICD-10-CM | POA: Diagnosis present

## 2023-11-02 DIAGNOSIS — D649 Anemia, unspecified: Secondary | ICD-10-CM | POA: Diagnosis not present

## 2023-11-02 DIAGNOSIS — D6481 Anemia due to antineoplastic chemotherapy: Secondary | ICD-10-CM | POA: Diagnosis present

## 2023-11-02 DIAGNOSIS — Z794 Long term (current) use of insulin: Secondary | ICD-10-CM | POA: Diagnosis not present

## 2023-11-02 DIAGNOSIS — E872 Acidosis, unspecified: Secondary | ICD-10-CM | POA: Diagnosis present

## 2023-11-02 DIAGNOSIS — E10319 Type 1 diabetes mellitus with unspecified diabetic retinopathy without macular edema: Secondary | ICD-10-CM | POA: Diagnosis present

## 2023-11-02 DIAGNOSIS — Z515 Encounter for palliative care: Secondary | ICD-10-CM | POA: Diagnosis not present

## 2023-11-02 DIAGNOSIS — R609 Edema, unspecified: Secondary | ICD-10-CM | POA: Diagnosis not present

## 2023-11-02 DIAGNOSIS — Z7189 Other specified counseling: Secondary | ICD-10-CM | POA: Diagnosis not present

## 2023-11-02 DIAGNOSIS — C8590 Non-Hodgkin lymphoma, unspecified, unspecified site: Secondary | ICD-10-CM | POA: Diagnosis present

## 2023-11-02 DIAGNOSIS — Z66 Do not resuscitate: Secondary | ICD-10-CM | POA: Diagnosis present

## 2023-11-02 DIAGNOSIS — Z681 Body mass index (BMI) 19 or less, adult: Secondary | ICD-10-CM | POA: Diagnosis not present

## 2023-11-02 DIAGNOSIS — I10 Essential (primary) hypertension: Secondary | ICD-10-CM | POA: Diagnosis present

## 2023-11-02 DIAGNOSIS — N179 Acute kidney failure, unspecified: Secondary | ICD-10-CM | POA: Diagnosis present

## 2023-11-02 DIAGNOSIS — F419 Anxiety disorder, unspecified: Secondary | ICD-10-CM | POA: Diagnosis present

## 2023-11-02 DIAGNOSIS — E43 Unspecified severe protein-calorie malnutrition: Secondary | ICD-10-CM | POA: Diagnosis present

## 2023-11-02 DIAGNOSIS — L89150 Pressure ulcer of sacral region, unstageable: Secondary | ICD-10-CM | POA: Diagnosis present

## 2023-11-02 DIAGNOSIS — N1330 Unspecified hydronephrosis: Secondary | ICD-10-CM | POA: Diagnosis not present

## 2023-11-02 DIAGNOSIS — Z79899 Other long term (current) drug therapy: Secondary | ICD-10-CM | POA: Diagnosis not present

## 2023-11-02 DIAGNOSIS — R627 Adult failure to thrive: Secondary | ICD-10-CM | POA: Diagnosis present

## 2023-11-02 DIAGNOSIS — K6812 Psoas muscle abscess: Secondary | ICD-10-CM | POA: Diagnosis present

## 2023-11-02 DIAGNOSIS — E785 Hyperlipidemia, unspecified: Secondary | ICD-10-CM | POA: Diagnosis present

## 2023-11-02 LAB — COMPREHENSIVE METABOLIC PANEL WITH GFR
ALT: 14 U/L (ref 0–44)
AST: 18 U/L (ref 15–41)
Albumin: 3 g/dL — ABNORMAL LOW (ref 3.5–5.0)
Alkaline Phosphatase: 69 U/L (ref 38–126)
Anion gap: 14 (ref 5–15)
BUN: 56 mg/dL — ABNORMAL HIGH (ref 8–23)
CO2: 17 mmol/L — ABNORMAL LOW (ref 22–32)
Calcium: 7.9 mg/dL — ABNORMAL LOW (ref 8.9–10.3)
Chloride: 104 mmol/L (ref 98–111)
Creatinine, Ser: 1.11 mg/dL (ref 0.61–1.24)
GFR, Estimated: >60 mL/min (ref >60)
Glucose, Bld: 319 mg/dL — ABNORMAL HIGH (ref 70–99)
Potassium: 5 mmol/L (ref 3.5–5.1)
Sodium: 135 mmol/L (ref 135–145)
Total Bilirubin: 1.1 mg/dL (ref 0.0–1.2)
Total Protein: 4.7 g/dL — ABNORMAL LOW (ref 6.5–8.1)

## 2023-11-02 LAB — URIC ACID: Uric Acid, Serum: 5.6 mg/dL (ref 3.7–8.6)

## 2023-11-02 LAB — CBC
HCT: 23.6 % — ABNORMAL LOW (ref 39.0–52.0)
Hemoglobin: 7.7 g/dL — ABNORMAL LOW (ref 13.0–17.0)
MCH: 29.5 pg (ref 26.0–34.0)
MCHC: 32.6 g/dL (ref 30.0–36.0)
MCV: 90.4 fL (ref 80.0–100.0)
Platelets: 82 K/uL — ABNORMAL LOW (ref 150–400)
RBC: 2.61 MIL/uL — ABNORMAL LOW (ref 4.22–5.81)
RDW: 16.5 % — ABNORMAL HIGH (ref 11.5–15.5)
WBC: 7.1 K/uL (ref 4.0–10.5)
nRBC: 0 % (ref 0.0–0.2)

## 2023-11-02 LAB — GLUCOSE, CAPILLARY
Glucose-Capillary: 334 mg/dL — ABNORMAL HIGH (ref 70–99)
Glucose-Capillary: 301 mg/dL — ABNORMAL HIGH (ref 70–99)
Glucose-Capillary: 259 mg/dL — ABNORMAL HIGH (ref 70–99)
Glucose-Capillary: 252 mg/dL — ABNORMAL HIGH (ref 70–99)
Glucose-Capillary: 181 mg/dL — ABNORMAL HIGH (ref 70–99)

## 2023-11-02 LAB — HEMOGLOBIN A1C
Hgb A1c MFr Bld: 6.5 % — ABNORMAL HIGH (ref 4.8–5.6)
Mean Plasma Glucose: 139.85 mg/dL

## 2023-11-02 LAB — LACTATE DEHYDROGENASE: LDH: 263 U/L — ABNORMAL HIGH (ref 98–192)

## 2023-11-02 MED ORDER — COLLAGENASE 250 UNIT/GM EX OINT
TOPICAL_OINTMENT | Freq: Every day | CUTANEOUS | Status: DC
  Administered 2023-11-07 – 2023-11-08 (×2): 1 via TOPICAL
  Filled 2023-11-02 (×2): qty 30

## 2023-11-02 MED ORDER — STERILE WATER FOR INJECTION IV SOLN
INTRAVENOUS | Status: AC
  Filled 2023-11-02: qty 150

## 2023-11-02 MED ORDER — JUVEN PO PACK
1.0000 | PACK | Freq: Two times a day (BID) | ORAL | Status: DC
  Administered 2023-11-02 – 2023-11-06 (×8): 1 via ORAL
  Filled 2023-11-02 (×8): qty 1

## 2023-11-02 MED ORDER — INSULIN GLARGINE 100 UNIT/ML ~~LOC~~ SOLN
10.0000 [IU] | Freq: Every day | SUBCUTANEOUS | Status: DC
  Administered 2023-11-02 – 2023-11-03 (×2): 10 [IU] via SUBCUTANEOUS
  Filled 2023-11-02 (×2): qty 0.1

## 2023-11-02 MED ORDER — ENSURE PLUS HIGH PROTEIN PO LIQD
237.0000 mL | Freq: Two times a day (BID) | ORAL | Status: DC
  Administered 2023-11-02: 237 mL via ORAL

## 2023-11-02 MED ORDER — INSULIN ASPART 100 UNIT/ML IJ SOLN
0.0000 [IU] | Freq: Three times a day (TID) | INTRAMUSCULAR | Status: DC
  Administered 2023-11-02: 5 [IU] via SUBCUTANEOUS
  Administered 2023-11-03: 2 [IU] via SUBCUTANEOUS
  Administered 2023-11-03: 1 [IU] via SUBCUTANEOUS
  Administered 2023-11-03 – 2023-11-05 (×3): 2 [IU] via SUBCUTANEOUS
  Administered 2023-11-05 – 2023-11-06 (×2): 1 [IU] via SUBCUTANEOUS
  Administered 2023-11-06: 2 [IU] via SUBCUTANEOUS

## 2023-11-02 MED ORDER — INSULIN ASPART 100 UNIT/ML IJ SOLN
0.0000 [IU] | Freq: Every day | INTRAMUSCULAR | Status: DC
  Administered 2023-11-04: 2 [IU] via SUBCUTANEOUS

## 2023-11-02 MED ORDER — PROSOURCE PLUS PO LIQD
30.0000 mL | Freq: Three times a day (TID) | ORAL | Status: DC
  Administered 2023-11-02 – 2023-11-06 (×11): 30 mL via ORAL
  Filled 2023-11-02 (×14): qty 30

## 2023-11-02 MED ORDER — INSULIN ASPART 100 UNIT/ML IJ SOLN
3.0000 [IU] | Freq: Once | INTRAMUSCULAR | Status: AC
Start: 2023-11-02 — End: 2023-11-02
  Administered 2023-11-02: 3 [IU] via SUBCUTANEOUS

## 2023-11-02 NOTE — Plan of Care (Signed)
  Problem: Education: Goal: Knowledge of General Education information will improve Description: Including pain rating scale, medication(s)/side effects and non-pharmacologic comfort measures Outcome: Progressing   Problem: Health Behavior/Discharge Planning: Goal: Ability to manage health-related needs will improve Outcome: Progressing   Problem: Clinical Measurements: Goal: Ability to maintain clinical measurements within normal limits will improve Outcome: Progressing Goal: Will remain free from infection Outcome: Progressing Goal: Diagnostic test results will improve Outcome: Progressing Goal: Respiratory complications will improve Outcome: Progressing Goal: Cardiovascular complication will be avoided Outcome: Progressing   Problem: Activity: Goal: Risk for activity intolerance will decrease Outcome: Progressing   Problem: Activity: Goal: Risk for activity intolerance will decrease Outcome: Progressing   Problem: Elimination: Goal: Will not experience complications related to bowel motility Outcome: Progressing Goal: Will not experience complications related to urinary retention Outcome: Progressing   Problem: Safety: Goal: Ability to remain free from injury will improve Outcome: Progressing

## 2023-11-02 NOTE — Progress Notes (Signed)
 PROGRESS NOTE  Tyler Scott  FMW:[id number] DOB: March 08, 1955 DOA: 11/01/2023 PCP: [facility]   Brief Narrative: Patient is a 68 year old male with history of insulin -dependent type type II, hypertension, CLL, ITP, pancytopenia, hypertension, hyperlipidemia who presented to the emergency department from [facility]  for evaluation of weakness, abnormal labs.  On presentation ,hemoglobin was 5.2, platelets of 76, creatinine of 1.3.  He was hemodynamically stable on presentation.  2 units  of blood transfusion ordered.  Oncology following.  Assessment & Plan:  Principal Problem:   Symptomatic anemia Active Problems:   Lymphoma, low grade (HCC)   CLL (chronic lymphocytic leukemia) (HCC)   Essential hypertension   Hyperlipidemia   Chronic ITP (idiopathic thrombocytopenia) (HCC)   Hyperkalemia   Insulin  dependent type 2 diabetes mellitus (HCC)   Cancer associated pain   Pressure injury of skin   Diarrhea  Symptomatic anemia: Has history of chronic pancytopenia secondary to CLL.  Hemoglobin was in the range of 5 on presentation.  Given 2 units of blood transfusion.  Continue to monitor H&H.  No evidence of acute blood loss.  Repeat hemoglobin is in the range of 7.  CLL/pancytopenia: History of CLL, was on different therapies.  Follows with Dr. Onesimo .oncology suspecting transformation of CLL to aggressive lymphoma.  Suspicion of bone marrow involvement.  Has chronic thrombocytopenia, suspected to be from chronic ITP.  He is on chemotherapy.Oncology consulted  AKI/hyperkalemia: Given Lokelma  for hyperkalemia.  Baseline creatinine normal.  CO2 of 17, likely associated with diarrhea.  Continue to monitor.  Started on gentle bicarb drip.  AKI has resolved.    Hypertension: On metoprolol    Insulin -dependent diabetes type 2: Takes insulin  at home.  Follow-up A1c level.  Hyperglycemic.  Continue current insulin  regimen.  Diarrhea: Looks like it has resolved.  Abdomen is soft, nontender  and nondistended with good bowel sounds  Failure to thrive/generalized weakness: PT consulted.  Nutritionist consulted.  Patient is cachectic and severely malnourished.  Has poor appetite.TOC consulted.  He needs to get back to rehab when he is discharged from here  Sacral pressure ulcer: Initially developed while at SNF.  Wound care nurse saw him here and recommended general surgery consultation for possible debridement by general surgery consulted         Wound 10/07/23 2200 Pressure Injury Coccyx Stage 3 -  Full thickness tissue loss. Subcutaneous fat may be visible but bone, tendon or muscle are NOT exposed. (Active)     Wound 11/01/23 1820 Pressure Injury Sacrum Bilateral Stage 3 -  Full thickness tissue loss. Subcutaneous fat may be visible but bone, tendon or muscle are NOT exposed. (Active)    DVT prophylaxis:SCDs Start: 11/01/23 1736     Code Status: Full Code  Family Communication: Discussed with wife at bedside on 9/23  Patient status:Obs  Patient is from : SNF  Anticipated discharge to: SNF  Estimated DC date: 2-3 days   Consultants: Oncology  Procedures:None  Antimicrobials:  Anti-infectives (From admission, onward)    Start     Dose/Rate Route Frequency Ordered Stop   11/02/23 1000  acyclovir  (ZOVIRAX ) tablet 400 mg        400 mg Oral Daily 11/01/23 1858         Subjective: Patient seen and examined at bedside today.  Very deconditioned, weak.  Lying in bed.  Wife at bedside.  Diarrhea has improved.  Kidney function has improved today.  Denies any abdomen pain, nausea or vomiting.  Has poor oral intake.  No chest pain or shortness of breath.  Has a pressure ulcer on the sacrum.  Objective: Vitals:   11/02/23 0128 11/02/23 0135 11/02/23 0204 11/02/23 0423  BP: (!) 121/58 123/60 130/60 136/66  Pulse: 87 87 88 91  Resp: 19 14 16 20   Temp: 99.7 F (37.6 C) 99.7 F (37.6 C) 99.2 F (37.3 C) 98.4 F (36.9 C)  TempSrc: Oral Oral Oral Oral  SpO2:  100% 100% 100% 99%  Weight:      Height:        Intake/Output Summary (Last 24 hours) at 11/02/2023 0803 Last data filed at 11/02/2023 0423 Gross per 24 hour  Intake 626 ml  Output 950 ml  Net -324 ml   Filed Weights   11/01/23 1526  Weight: 58.5 kg    Examination:  General exam: Overall comfortable, not in distress but very weak and deconditioned, cachectic HEENT: PERRL Respiratory system:  no wheezes or crackles  Cardiovascular system: S1 & S2 heard, RRR.  Gastrointestinal system: Abdomen is nondistended, soft and nontender. Central nervous system: Alert and oriented Extremities: No edema, no clubbing ,no cyanosis Skin: Sacral pressure ulcer     Data Reviewed: I have personally reviewed following labs and imaging studies  CBC: Recent Labs  Lab 11/01/23 1544 11/02/23 0607  WBC 4.6 7.1  HGB 5.2* 7.7*  HCT 16.7* 23.6*  MCV 93.8 90.4  PLT 76* 82*   Basic Metabolic Panel: Recent Labs  Lab 11/01/23 1544 11/02/23 0607  NA 134* 135  K 5.7* 5.0  CL 103 104  CO2 17* 17*  GLUCOSE 359* 319*  BUN 62* 56*  CREATININE 1.34* 1.11  CALCIUM  8.2* 7.9*     No results found for this or any previous visit (from the past 240 hours).   Radiology Studies: No results found.  Scheduled Meds:  sodium chloride    Intravenous Once   acyclovir   400 mg Oral Daily   allopurinol   300 mg Oral Daily   feeding supplement  237 mL Oral BID BM   gabapentin   100 mg Oral TID   HYDROmorphone   2 mg Oral QHS   insulin  aspart  0-9 Units Subcutaneous TID WC   metoprolol  tartrate  12.5 mg Oral BID   pantoprazole   40 mg Oral QHS   sertraline   25 mg Oral Daily   Continuous Infusions:   LOS: 0 days   Ivonne Mustache, MD [facility] P9/23/2025, 8:03 AM

## 2023-11-02 NOTE — Plan of Care (Signed)
   Problem: Coping: Goal: Level of anxiety will decrease Outcome: Progressing   Problem: Pain Managment: Goal: General experience of comfort will improve and/or be controlled Outcome: Progressing   Problem: Safety: Goal: Ability to remain free from injury will improve Outcome: Progressing

## 2023-11-02 NOTE — Inpatient Diabetes Management (Signed)
 Inpatient Diabetes Program Recommendations  AACE/ADA: New Consensus Statement on Inpatient Glycemic Control (2015)  Target Ranges:  Prepandial:   less than 140 mg/dL      Peak postprandial:   less than 180 mg/dL (1-2 hours)      Critically ill patients:  140 - 180 mg/dL   Lab Results  Component Value Date   GLUCAP 259 (H) 11/02/2023   HGBA1C 6.5 (H) 11/02/2023    Latest Reference Range & Units Most Recent 11/02/23 02:02 11/02/23 07:37 11/02/23 11:53  Glucose-Capillary 70 - 99 mg/dL 740 (H) 0/76/74 88:46 665 (H) 301 (H) 259 (H)    Review of Glycemic Control  Diabetes history: DM2 Outpatient Diabetes medications: Lantus  12 units daily, Humalog 2-12 units TID, Prednisone  4 mg daily for first 2 days after chemo Current orders for Inpatient glycemic control: Lantus  10 daily, Novolog  0-9 TID with meals and 0-5 HS   HgbA1C - 6.5% Blood sugars in 300s.  Hyperglycemia likely from sacrum wound.  Inpatient Diabetes Program Recommendations:    Increase Lantus  to 12 units daily  Increase Novolog  to 0-15 TID with meals and 0-5 HS  Follow glucose trends.  Thank you. Shona Brandy, RD, LDN, CDCES Inpatient Diabetes Coordinator [phone]

## 2023-11-02 NOTE — TOC Initial Note (Signed)
 Transition of Care [facility]) - Initial/Assessment Note    Patient Details  Name: Tyler Scott MRN: [id number] Date of Birth: 1955-07-16  [facility]) CM/SW Contact:    Toy LITTIE Agar, RN Phone [phone]  11/02/2023, 3:35 PM  Clinical Narrative:                 Inpatient  care management following patient with high risk for readmission. Patient is from [facility] healthcare where he was undergoing short term rehab. Patient currently is experiencing increasing weakness. Therapy to evaluate to determine disposition plan. IP CM to follow for disposition needs.   Expected Discharge Plan: Skilled Nursing Facility Barriers to Discharge: Continued Medical Work up   Patient Goals and CMS Choice Patient states their goals for this hospitalization and ongoing recovery are:: Manage symptoms to feel better. CMS Medicare.gov Compare Post Acute Care list provided to::  (n/a) Choice offered to / list presented to : NA      Expected Discharge Plan and Services In-house Referral: NA Discharge Planning Services: CM Consult   Living arrangements for the past 2 months: Skilled Nursing Facility                 DME Arranged: N/A DME Agency: NA       HH Arranged: NA HH Agency: NA        Prior Living Arrangements/Services Living arrangements for the past 2 months: Skilled Nursing Facility Lives with:: Spouse (currently resident at [facility]) Patient language and need for interpreter reviewed:: Yes Do you feel safe going back to the place where you live?: Yes      Need for Family Participation in Patient Care: Yes (Comment) Care giver support system in place?: Yes (comment) Current home services: Other (comment) (inpatient SNF) Criminal Activity/Legal Involvement Pertinent to Current Situation/Hospitalization: No - Comment as needed  Activities of Daily Living   ADL Screening (condition at time of admission) Independently performs ADLs?: No Does the  patient have a NEW difficulty with bathing/dressing/toileting/self-feeding that is expected to last >3 days?: Yes (Initiates electronic notice to provider for possible OT consult) Does the patient have a NEW difficulty with getting in/out of bed, walking, or climbing stairs that is expected to last >3 days?: No Does the patient have a NEW difficulty with communication that is expected to last >3 days?: No Is the patient deaf or have difficulty hearing?: No Does the patient have difficulty seeing, even when wearing glasses/contacts?: No Does the patient have difficulty concentrating, remembering, or making decisions?: Yes  Permission Sought/Granted Permission sought to share information with : Family Supports Permission granted to share information with : Yes, Verbal Permission Granted  Share Information with NAME: Paxton Binns     Permission granted to share info w Relationship: Spouse  Permission granted to share info w Contact Information: [phone]  Emotional Assessment Appearance:: Appears stated age Attitude/Demeanor/Rapport: Engaged Affect (typically observed): Pleasant Orientation: : Oriented to Self, Oriented to Place, Oriented to  Time, Oriented to Situation Alcohol  / Substance Use: Not Applicable Psych Involvement: No (comment)  Admission diagnosis:  CLL (chronic lymphocytic leukemia) (HCC) [C91.10] Symptomatic anemia [D64.9] Other iron deficiency anemia [D50.8] Patient Active Problem List   Diagnosis Date Noted   Benign prostatic hyperplasia 11/01/2023   Osteoarthritis 11/01/2023   Diarrhea 11/01/2023   Adjustment disorder with mixed anxiety and depressed mood 10/15/2023   Vitamin K deficiency 10/08/2023   Tumor lysis syndrome 10/08/2023   Symptomatic anemia 10/08/2023   Pressure injury of skin 10/08/2023  Shock (HCC) 10/07/2023   Thrombocytopenia 09/01/2023   Anemia 09/01/2023   Need for emotional support 08/29/2023   Palliative care encounter 08/28/2023    High risk medication use 08/28/2023   Medication management 08/28/2023   Counseling and coordination of care 08/28/2023   Cancer associated pain 08/28/2023   Cancer-related breakthrough pain 08/27/2023   Normocytic anemia 08/27/2023   Lymphoma, low grade (HCC) 08/27/2023   Chronic ITP (idiopathic thrombocytopenia) (HCC) 08/11/2023   Insulin  dependent type 2 diabetes mellitus (HCC) 08/06/2023   AKI (acute kidney injury) 08/06/2023   Pancytopenia (HCC) 08/06/2023   Controlled type 2 diabetes mellitus with retinopathy, with long-term current use of insulin  (HCC) 09/14/2016   Hyperkalemia 01/21/2013   CLL (chronic lymphocytic leukemia) (HCC)    Type 1 diabetes mellitus (HCC) 12/29/2007   Hyperlipidemia 12/30/2006   Essential hypertension 12/30/2006   ERECTILE DYSFUNCTION, ORGANIC 12/30/2006   PCP:  [facility] Pharmacy:   [facility] - [place], [place] - [address] [place] [address] Phone: [phone] Fax: [phone]     Social Drivers of Health (SDOH) Social History: SDOH Screenings   Food Insecurity: No Food Insecurity (11/01/2023)  Housing: Low Risk  (11/01/2023)  Transportation Needs: No Transportation Needs (11/01/2023)  Utilities: Not At Risk (11/01/2023)  Depression (PHQ2-9): Low Risk  (09/14/2023)  Social Connections: Moderately Isolated (11/01/2023)  Tobacco Use: Low Risk  (11/02/2023)   SDOH Interventions:     Readmission Risk Interventions    10/23/2023    2:09 PM 10/11/2023    2:19 PM 08/30/2023   11:40 AM  Readmission Risk Prevention Plan  Transportation Screening Complete Complete Complete  PCP or Specialist Appt within 3-5 Days   Complete  HRI or Home Care Consult   Complete  Social Work Consult for Recovery Care Planning/Counseling   Complete  Palliative Care Screening   Complete  Medication Review Oceanographer) Complete Complete Complete  PCP or Specialist appointment within 3-5 days of  discharge Complete Complete   HRI or Home Care Consult  Complete   SW Recovery Care/Counseling Consult Complete Complete   Palliative Care Screening Not Applicable Not Applicable   Skilled Nursing Facility Complete Not Applicable

## 2023-11-02 NOTE — Consult Note (Signed)
 WOC Nurse Consult Note: Reason for Consult: Consult requested for sacrum wound.  Performed remotely after review of progress notes and photo in the EMR.   Secure Chat message sent to the primary team as follows:   This patient has an extensive amt of nonviable eschar to the sacrum wound.  I will provide topical treatment recommendations but I recommend a surgical consult for possible debridement.  Sacrum and bilat buttocks with an Unstageable pressure injury; approx 80% eschar, 20% red.  Topical treatment orders provided for bedside nurses to perform to assist with the removal of nonviable tissue:  Pressure Injury POA: Yes  Please re-consult if further assistance is needed.  Thank-you,  Stephane Fought MSN, RN, CWOCN, CWCN-AP, CNS Contact Mon-Fri 0700-1500: [phone]

## 2023-11-02 NOTE — Telephone Encounter (Signed)
 Oncology Pharmacist Encounter  Patient is in ED since the evening of 9/22 due to wound and possible surgical consult for debridement. Patient was scheduled to see Dr. Onesimo on 9/24 to start treatment. Will continue to monitor for when pt has been discharged for counseling on the venetoclax  prior to starting treatment.   Caroline Matters, PharmD Hematology/Oncology Clinical Pharmacist [facility] [phone]

## 2023-11-02 NOTE — Consult Note (Signed)
 Tyler Scott 06-06-1955  [id number].    Requesting MD: Dr. Ivonne Mustache Chief Complaint/Reason for Consult: sacral decubitus ulcer  HPI:  This is a pleasant chronically ill 68 yo male with numerous medical problems outlined below.  He has CCL and has been getting chemotherapy.  For the last several weeks he has had significant lower extremity weakness.  He noticed this wound on his sacrum about 1 month ago, he thinks.  He is mostly in a bed and wheelchair right now with minimal mobilization due to the weakness in his legs.  He denies any recent fevers.  He admits to pain in this area mostly when he is getting cleaned up after a BM, etc. He has been admitted to the medical service for management of his medical issues.  We have been asked to see him for evaluation for surgical debridement of his wound.  ROS: ROS: see HPI; left leg/hip pain with turning  Family History  Problem Relation Age of Onset   Alcohol  abuse Other    Colon cancer Brother 66       Died 30-Oct-2013   Diabetes Mellitus II Mother    Esophageal cancer Neg Hx    Rectal cancer Neg Hx    Stomach cancer Neg Hx     Past Medical History:  Diagnosis Date   Allergy    Arthritis    CLL (chronic lymphocytic leukemia) (HCC)    stage III: asymptomatic; on observation   DIABETES MELLITUS, TYPE I, UNCONTROLLED 12/30/2006   insulin    Diabetic retinopathy    ERECTILE DYSFUNCTION, ORGANIC 12/30/2006   Hx of adenomatous polyp of colon 05/24/2014   HYPERLIPIDEMIA 12/30/2006   HYPERTENSION 12/30/2006   LOW BACK PAIN, ACUTE 12/21/2006    Past Surgical History:  Procedure Laterality Date   COLONOSCOPY     great right toe- Fused the I P joint      Social History:  reports that he has never smoked. He has never used smokeless tobacco. He reports current alcohol  use of about 4.0 standard drinks of alcohol  per week. He reports that he does not use drugs.  Allergies: No Known Allergies  Medications Prior to Admission   Medication Sig Dispense Refill   acyclovir  (ZOVIRAX ) 400 MG tablet Take 1 tablet (400 mg total) by mouth daily. 30 tablet 3   allopurinol  (ZYLOPRIM ) 300 MG tablet Take 150 mg by mouth daily.     artificial tears ophthalmic solution Place 1 drop into both eyes in the morning.     Cholecalciferol 50 MCG (2000 UT) TABS Take 2,000 Units by mouth in the morning.     dexamethasone  (DECADRON ) 4 MG tablet Take 4 mg by mouth See admin instructions. Take 4 mg by mouth once a day for 2 days after chemo only     doxepin  (SINEQUAN ) 25 MG capsule Take 25 mg by mouth at bedtime.     gabapentin  (NEURONTIN ) 100 MG capsule Take 1 capsule (100 mg total) by mouth 3 (three) times daily. 90 capsule 1   HUMALOG KWIKPEN 100 UNIT/ML KwikPen Inject 2-12 Units into the skin See admin instructions. Inject 2-12 units into the skin 3 times a day with meals, per sliding scale: BGL <60 or 70-150 = give nothing; 151-200 = 2 units; 201-250 = 4 units; 251-300 = 6 units; 301-350 = 8 units; 351-400 = 10 units; >400 = 12 units and CALL NP     HYDROmorphone  (DILAUDID ) 2 MG tablet Take 1 tablet (2 mg total)  by mouth at bedtime. 30 tablet 0   insulin  glargine (LANTUS ) 100 UNIT/ML injection Inject 12 Units into the skin daily.     methocarbamol  (ROBAXIN ) 500 MG tablet Take 1 tablet (500 mg total) by mouth at bedtime.     metoprolol  tartrate (LOPRESSOR ) 25 MG tablet Take 0.5 tablets (12.5 mg total) by mouth 2 (two) times daily.     ondansetron  (ZOFRAN ) 8 MG tablet Take 1 tablet (8 mg total) by mouth every 8 (eight) hours as needed for nausea or vomiting. Start on the third day after chemotherapy. (Patient taking differently: Take 8 mg by mouth every 8 (eight) hours as needed for nausea or vomiting (start on the 3rd day after chemo).) 30 tablet 1   pantoprazole  (PROTONIX ) 40 MG tablet Take 1 tablet (40 mg total) by mouth at bedtime.     polyethylene glycol powder (GLYCOLAX /MIRALAX ) 17 GM/SCOOP powder Take 17 g dissolved in liquid by mouth  daily. 476 g 2   senna-docusate (SENOKOT-S) 8.6-50 MG tablet Take 1 tablet by mouth 2 (two) times daily.     sertraline  (ZOLOFT ) 25 MG tablet Take 1 tablet (25 mg total) by mouth daily.     simvastatin  (ZOCOR ) 20 MG tablet TAKE 1 TABLET (20 MG TOTAL) BY MOUTH AT BEDTIME. 100 tablet 4   sodium zirconium cyclosilicate  (LOKELMA ) 10 g PACK packet Take 10 g by mouth 3 (three) times daily.     traMADol  (ULTRAM ) 50 MG tablet Take 1 tablet (50 mg total) by mouth every 6 (six) hours as needed for moderate pain (pain score 4-6). 30 tablet 0   venetoclax  (VENCLEXTA ) 10 & 50 & 100 MG Starter Pack Take by mouth daily. Take 20 mg for 7 days, then 50 mg daily x 7d, then 100 mg daily x 7d, then 200 mg daily x 7d. Take with food & water . 42 tablet 0   insulin  aspart (NOVOLOG ) 100 UNIT/ML injection Inject 0-6 Units into the skin 3 (three) times daily with meals. (Patient not taking: Reported on 11/01/2023)     ONE TOUCH ULTRA TEST test strip TEST GLUCOSE 3 TIMES A DAY 300 each 1   ONETOUCH DELICA LANCETS 33G MISC TEST GLUCOSE 3 TIMES A DAY 300 each 1     Physical Exam: Blood pressure 129/64, pulse 91, temperature 97.8 F (36.6 C), temperature source Oral, resp. rate 18, height 5' 8 (1.727 m), weight 58.5 kg, SpO2 100%. General: pleasant, chronically ill, frail appearing male laying in bed in NAD except when getting turned HEENT: head is normocephalic, atraumatic.  Sclera are noninjected.  PERRL.  Ears and nose without any masses or lesions.  Cheeks are sunken in  Lungs: Respiratory effort nonlabored MS: all 4 extremities are symmetrical with significant atrophy Skin: warm and dry with no masses, lesions, or rashes, unstageable sacral wound.  This has some areas of granulation tissue around the edges, but otherwise is covered with a hard, leathery eschar.  There is no overt evidence of infection.  This is not soft or loose.  No surrounding erythema, no fluctuance  Psych: A&Ox3 with an appropriate  affect.   Results for orders placed or performed during the hospital encounter of 11/01/23 (from the past 48 hours)  CBG monitoring, ED     Status: Abnormal   Collection Time: 11/01/23  3:43 PM  Result Value Ref Range   Glucose-Capillary 346 (H) 70 - 99 mg/dL    Comment: Glucose reference range applies only to samples taken after fasting for at least 8  hours.  Comprehensive metabolic panel     Status: Abnormal   Collection Time: 11/01/23  3:44 PM  Result Value Ref Range   Sodium 134 (L) 135 - 145 mmol/L   Potassium 5.7 (H) 3.5 - 5.1 mmol/L   Chloride 103 98 - 111 mmol/L   CO2 17 (L) 22 - 32 mmol/L   Glucose, Bld 359 (H) 70 - 99 mg/dL    Comment: Glucose reference range applies only to samples taken after fasting for at least 8 hours.   BUN 62 (H) 8 - 23 mg/dL   Creatinine, Ser 8.65 (H) 0.61 - 1.24 mg/dL   Calcium  8.2 (L) 8.9 - 10.3 mg/dL   Total Protein 5.3 (L) 6.5 - 8.1 g/dL   Albumin 3.3 (L) 3.5 - 5.0 g/dL   AST 23 15 - 41 U/L   ALT 18 0 - 44 U/L   Alkaline Phosphatase 77 38 - 126 U/L   Total Bilirubin 0.9 0.0 - 1.2 mg/dL   GFR, Estimated 58 (L) >60 mL/min    Comment: (NOTE) Calculated using the CKD-EPI Creatinine Equation (2021)    Anion gap 15 5 - 15    Comment: Performed at [facility], [address], [place], [place] [address]  CBC     Status: Abnormal   Collection Time: 11/01/23  3:44 PM  Result Value Ref Range   WBC 4.6 4.0 - 10.5 K/uL   RBC 1.78 (L) 4.22 - 5.81 MIL/uL   Hemoglobin 5.2 (LL) 13.0 - 17.0 g/dL    Comment: REPEATED TO VERIFY This critical result has been called to M.LEWIS, RN by Rankin Hummer on 11/01/2023 16:53:56, and has been read back. CRITICAL RESULT VERIFIED.    HCT 16.7 (L) 39.0 - 52.0 %   MCV 93.8 80.0 - 100.0 fL   MCH 29.2 26.0 - 34.0 pg   MCHC 31.1 30.0 - 36.0 g/dL   RDW 81.7 (H) 88.4 - 84.4 %   Platelets 76 (L) 150 - 400 K/uL    Comment: SPECIMEN CHECKED FOR CLOTS REPEATED TO VERIFY PLATELET COUNT CONFIRMED BY  SMEAR Immature Platelet Fraction may be clinically indicated, consider ordering this additional test [id number]    nRBC 0.0 0.0 - 0.2 %    Comment: Performed at [facility], [address], [place], [place] [address]  Type and screen [facility]     Status: None (Preliminary result)   Collection Time: 11/01/23  4:32 PM  Result Value Ref Range   ABO/RH(D) A POS    Antibody Screen NEG    Sample Expiration 11/04/2023,2359    Unit Number [id number]    Blood Component Type RED CELLS,LR    Unit division 00    Status of Unit ISSUED    Transfusion Status OK TO TRANSFUSE    Crossmatch Result Compatible    Unit Number [id number]    Blood Component Type RED CELLS,LR    Unit division 00    Status of Unit ISSUED,FINAL    Transfusion Status OK TO TRANSFUSE    Crossmatch Result      Compatible Performed at [facility], [address], [place], [place] [address]   Prepare RBC (crossmatch)     Status: None   Collection Time: 11/01/23  4:55 PM  Result Value Ref Range   Order Confirmation      ORDER PROCESSED BY BLOOD BANK Performed at [facility], [address] [place], [place] [address]   Urinalysis,  Routine w reflex microscopic -Urine, Clean Catch     Status: Abnormal   Collection Time: 11/01/23  4:56 PM  Result Value Ref Range   Color, Urine YELLOW YELLOW   APPearance CLEAR CLEAR   Specific Gravity, Urine 1.014 1.005 - 1.030   pH 5.0 5.0 - 8.0   Glucose, UA 150 (A) NEGATIVE mg/dL   Hgb urine dipstick NEGATIVE NEGATIVE   Bilirubin Urine NEGATIVE NEGATIVE   Ketones, ur NEGATIVE NEGATIVE mg/dL   Protein, ur NEGATIVE NEGATIVE mg/dL   Nitrite NEGATIVE NEGATIVE   Leukocytes,Ua SMALL (A) NEGATIVE   RBC / HPF 0-5 0 - 5 RBC/hpf   WBC, UA 6-10 0 - 5 WBC/hpf   Bacteria, UA RARE (A) NONE SEEN   Squamous Epithelial / HPF 0-5 0 - 5 /HPF   Mucus PRESENT    Budding Yeast PRESENT     Comment: Performed at  [facility], [address], [place], [place] [address]  Glucose, capillary     Status: Abnormal   Collection Time: 11/01/23  9:08 PM  Result Value Ref Range   Glucose-Capillary 337 (H) 70 - 99 mg/dL    Comment: Glucose reference range applies only to samples taken after fasting for at least 8 hours.  Glucose, capillary     Status: Abnormal   Collection Time: 11/02/23  2:02 AM  Result Value Ref Range   Glucose-Capillary 334 (H) 70 - 99 mg/dL    Comment: Glucose reference range applies only to samples taken after fasting for at least 8 hours.  CBC     Status: Abnormal   Collection Time: 11/02/23  6:07 AM  Result Value Ref Range   WBC 7.1 4.0 - 10.5 K/uL   RBC 2.61 (L) 4.22 - 5.81 MIL/uL   Hemoglobin 7.7 (L) 13.0 - 17.0 g/dL    Comment: REPEATED TO VERIFY POST TRANSFUSION SPECIMEN    HCT 23.6 (L) 39.0 - 52.0 %   MCV 90.4 80.0 - 100.0 fL   MCH 29.5 26.0 - 34.0 pg   MCHC 32.6 30.0 - 36.0 g/dL   RDW 83.4 (H) 88.4 - 84.4 %   Platelets 82 (L) 150 - 400 K/uL    Comment: SPECIMEN CHECKED FOR CLOTS CONSISTENT WITH PREVIOUS RESULT REPEATED TO VERIFY Immature Platelet Fraction may be clinically indicated, consider ordering this additional test [id number]    nRBC 0.0 0.0 - 0.2 %    Comment: Performed at [facility], [address] [place], [place] [address]  Comprehensive metabolic panel     Status: Abnormal   Collection Time: 11/02/23  6:07 AM  Result Value Ref Range   Sodium 135 135 - 145 mmol/L   Potassium 5.0 3.5 - 5.1 mmol/L   Chloride 104 98 - 111 mmol/L   CO2 17 (L) 22 - 32 mmol/L   Glucose, Bld 319 (H) 70 - 99 mg/dL    Comment: Glucose reference range applies only to samples taken after fasting for at least 8 hours.   BUN 56 (H) 8 - 23 mg/dL   Creatinine, Ser 8.88 0.61 - 1.24 mg/dL   Calcium  7.9 (L) 8.9 - 10.3 mg/dL   Total Protein 4.7 (L) 6.5 - 8.1 g/dL   Albumin 3.0 (L) 3.5 - 5.0 g/dL   AST 18 15 - 41 U/L   ALT 14 0 - 44 U/L    Alkaline Phosphatase 69 38 - 126 U/L   Total Bilirubin 1.1 0.0 - 1.2 mg/dL   GFR, Estimated >39 >  60 mL/min    Comment: (NOTE) Calculated using the CKD-EPI Creatinine Equation (2021)    Anion gap 14 5 - 15    Comment: Performed at [facility], [address], [place], [place] [address]  Glucose, capillary     Status: Abnormal   Collection Time: 11/02/23  7:37 AM  Result Value Ref Range   Glucose-Capillary 301 (H) 70 - 99 mg/dL    Comment: Glucose reference range applies only to samples taken after fasting for at least 8 hours.  Hemoglobin A1c     Status: Abnormal   Collection Time: 11/02/23  8:37 AM  Result Value Ref Range   Hgb A1c MFr Bld 6.5 (H) 4.8 - 5.6 %    Comment: (NOTE) Diagnosis of Diabetes The following HbA1c ranges recommended by the American Diabetes Association (ADA) may be used as an aid in the diagnosis of diabetes mellitus.  Hemoglobin             Suggested A1C NGSP%              Diagnosis  <5.7                   Non Diabetic  5.7-6.4                Pre-Diabetic  >6.4                   Diabetic  <7.0                   Glycemic control for                       adults with diabetes.     Mean Plasma Glucose 139.85 mg/dL    Comment: Performed at [facility], [address], [place], [place] [address]  Uric acid     Status: None   Collection Time: 11/02/23 11:40 AM  Result Value Ref Range   Uric Acid, Serum 5.6 3.7 - 8.6 mg/dL    Comment: Performed at [facility], [address] [place], [place] [address]  Lactate dehydrogenase     Status: Abnormal   Collection Time: 11/02/23 11:40 AM  Result Value Ref Range   LDH 263 (H) 98 - 192 U/L    Comment: Performed at [facility], [address] [place], [place] [address]  Glucose, capillary     Status: Abnormal   Collection Time: 11/02/23 11:53 AM  Result Value Ref Range   Glucose-Capillary 259 (H) 70 - 99 mg/dL    Comment: Glucose reference  range applies only to samples taken after fasting for at least 8 hours.   No results found.    Assessment/Plan Unstageable sacral decubitus ulcer The patient has been seen, examined, labs, vitals, and chart reviewed.  He has an unstageable sacral ulcer that is mostly covered with a hard, leathery eschar.  Because of this, we would not recommend surgical debridement, but to soften this up and debride this chemically with santyl .  It would also benefit from hydrotherapy, which is somewhat limited in availability at this time.  We will order this so if it is possible, this can be done as well to help break down this eschar.  The patient states there is a wound team at his facility who follows the wound.  If he needs further assistance and management of this wound moving forward, he would be referred to the  wound care clinic.  No acute plans for surgical debridement.  We will sign off.   I reviewed Consultant WOC notes, hospitalist notes, last 24 h vitals and pain scores, last 48 h intake and output, last 24 h labs and trends, and last 24 h imaging results.  Burnard FORBES Banter, [facility] 11/02/2023, 1:59 PM Please see Amion for pager number during day hours 7:00am-4:30pm or 7:00am -11:30am on weekends

## 2023-11-02 NOTE — Progress Notes (Signed)
 Initial Nutrition Assessment  DOCUMENTATION CODES:   Severe malnutrition in context of chronic illness  INTERVENTION:   -Prosource Plus PO TID, each provides 100 kcals and 15g protein   -1 packet Juven BID, each packet provides 95 calories, 2.5 grams of protein (collagen), and 9.8 grams of carbohydrate (3 grams sugar); also contains 7 grams of L-arginine and L-glutamine, 300 mg vitamin C, 15 mg vitamin E, 1.2 mcg vitamin B-12, 9.5 mg zinc, 200 mg calcium , and 1.5 g  Calcium  Beta-hydroxy-Beta-methylbutyrate to support wound healing   -Liberalized diet to regular given severe malnutrition and diet limiting pt's choices. A1c 6.5.  NUTRITION DIAGNOSIS:   Severe Malnutrition related to chronic illness, cancer and cancer related treatments as evidenced by severe fat depletion, severe muscle depletion, energy intake < or equal to 50% for > or equal to 1 month, percent weight loss.  GOAL:   Patient will meet greater than or equal to 90% of their needs  MONITOR:   PO intake, Supplement acceptance  REASON FOR ASSESSMENT:   Consult Assessment of nutrition requirement/status  ASSESSMENT:   68 y.o. male with medical history significant of anemia, osteoarthritis, BPH, CLL, low-grade lymphoma, chronic ITP, pancytopenia, tumor lysis syndrome, diabetic retinopathy, insulin  requiring type 2 diabetes, essential hypertension, hyperlipidemia, pressure injury of skin, vitamin K deficiency who was sent to the emergency department from [facility] health care clinic due to generalized weakness and abnormal labs. He had 3 episodes of diarrhea in the last 2 days associated with mild abdominal discomfort.  He has been using MiraLAX   Patient in room, reports he cannot tolerate Ensure supplements d/t the lactose but later said any milk products cause diarrhea for him. Willing to try Prosource supplements and Juven to aid in wound healing of unstageable sacral wound. Wound is contributing to hyperglycemia, as pt's  A1c is 6.5.  Pt states wanting to order some food but being told he cannot have certain items d/t diet. Will liberalize diet to regular to help with this and to promote good intakes given malnutrition.  Pt reports his taste changes have subsided. Main concern has been diarrhea but also reports he was placed on Miralax  and stool softeners at facility which may be the cause.   Per weight records, pt has lost 15 lbs since 6/27 (10% wt loss x 3 months, significant for time frame).  Medications reviewed.  Labs reviewed: CBGs: 259-337   NUTRITION - FOCUSED PHYSICAL EXAM:  Flowsheet Row Most Recent Value  Orbital Region Severe depletion  Upper Arm Region Severe depletion  Thoracic and Lumbar Region Severe depletion  Buccal Region Severe depletion  Temple Region Severe depletion  Clavicle Bone Region Severe depletion  Clavicle and Acromion Bone Region Severe depletion  Scapular Bone Region Severe depletion  Dorsal Hand Severe depletion  Patellar Region Severe depletion  [reports pain from CLL]  Anterior Thigh Region Severe depletion  Posterior Calf Region Severe depletion  Edema (RD Assessment) None  Hair Unable to assess  [covered]  Eyes Reviewed  Mouth Reviewed  Skin Reviewed  Nails Reviewed    Diet Order:   Diet Order             Diet regular Fluid consistency: Thin  Diet effective now                   EDUCATION NEEDS:   Education needs have been addressed  Skin:  Skin Assessment: Skin Integrity Issues: Skin Integrity Issues:: Unstageable Unstageable: coccyx, bilateral sacrum  Last BM:  9/22  Height:   Ht Readings from Last 1 Encounters:  11/01/23 5' 8 (1.727 m)    Weight:   Wt Readings from Last 1 Encounters:  11/01/23 58.5 kg    BMI:  Body mass index is 19.61 kg/m.  Estimated Nutritional Needs:   Kcal:  1800-2000  Protein:  90-110g  Fluid:  2L/day  Morna Lee, MS, RD, LDN Inpatient Clinical Dietitian Contact via Secure chat

## 2023-11-02 NOTE — Progress Notes (Signed)
   11/02/23 1211  PT Visit Information  Last PT Received On 11/02/23  Reason Eval/Treat Not Completed Fatigue/lethargy limiting ability to participate  History of Present Illness Tyler Scott is a 68 y.o. male admited 11/01/23 with medical history significant of anemia, osteoarthritis, BPH, CLL, low-grade lymphoma, chronic ITP, pancytopenia, tumor lysis syndrome, diabetic retinopathy, insulin  requiring type 2 diabetes, essential hypertension, hyperlipidemia, pressure injury of skin, vitamin K deficiency who was sent to the emergency department from [facility] health care clinic due to generalized weakness and abnormal labs.   Pt defers PT consult to next day due to fatigue and pain in low back, will follow up.   Stann, PT Acute Rehabilitation Services Office: [phone] 11/02/2023

## 2023-11-02 NOTE — Progress Notes (Addendum)
       Code Status/Type confirmation of interventions  NAME: TANNON PEERSON MRN: [id number] DOB : [birth year]    Date of Service   11/02/2023   HPI/Events of Note   Notified by RN for Patient request to be FULL CODE   The following were discussed with patient in the presence of the RN/personally.    Patient has verified :   CPR  - yes  Shock - yes ACLS - Yes Vent - Yes  BiPAP - Yes Vasopressors - Yes  Patient is awake and oriented x4 , Readily answers Name, Location [facility] [place] [facility].   Interviewed in presence of Staff RNs   Interventions   Plan: Code Status:  FUll CODE as listed  Continue Attending orders         [facility] [place]

## 2023-11-02 NOTE — Progress Notes (Signed)
 Tyler Scott Tyler Scott   DOB:12-01-1955   FM#:[id number]      ASSESSMENT & PLAN:  Tyler Scott Tyler Scott is a 68 year old male patient with oncologic history significant for CLL.  Patient has had multiple hospital admissions.  He is now admitted with severe anemia and failure to thrive.  Follows with Medical Oncology/Dr. Onesimo.  Severe Anemia, symptomatic -- Likely multifactorial secondary to oral chemo, malignancy, poor nutrition -- Hemoglobin 5.2 on admission 9/22.   -- Status post 2 units PRBC transfusions.   -- HGB improved to 7.7 today.  -- Recommend PRBC transfusion for HGB <7.0 -- Continue to monitor CBC with differential  Thrombocytopenia -- Platelets improving, 82K today -- Has been on weekly Nplate  4 mcg/kg.  Maintain platelet count >50K. -- Continue to monitor CBC with differential  CLL/SLL/Low grade Lymphoma -Diagnosed 2012 - Status post multiple lines of therapy including Bendamustine , rituximab , ibrutinib , Calquence .  Received Bendamustine  without rituximab  09/08/2023. - Previously with many interrupted cycles of therapy due to no-shows. - Most recently on venetoclax  + Rituxan . - Has been admitted to SNF. - LDH and uric acid, monitoring for hemolysis.  Uric acid WNL 5.6.  LDH elevated 263. - Medical oncology/Dr. Onesimo following closely  Failure to thrive Weight loss/poor appetite - Continues to lose weight.  Admits to poor appetite. - Recommend dietitian eval - Continue supportive care  Lower extremity weakness with edema - Edema 1+ of both feet - Patient reports minimal ambulation as he states his legs are not functioning. - Recommend PT support - Continue supportive care  Sacral pressure ulcer  - Likely developed at SNF - Surgical eval for possible debridement  Diabetes - Monitor blood sugar levels - Continue insulin  coverage per protocol      Code Status Full   Subjective:  Patient seen awake and alert laying in bed, bloodwork being drawn.  He is  cachectic and frail appearing.  Denies pain, or acute GI symptoms.  States his legs are not functioning because of the cancer.  States he was eating at the facility but admits to eating only small amounts, that he is lactose intolerant and his blood sugars get very high with foods given to him.  No other complaints offered.   Objective:   Intake/Output Summary (Last 24 hours) at 11/02/2023 1027 Last data filed at 11/02/2023 0423 Gross per 24 hour  Intake 626 ml  Output 950 ml  Net -324 ml     PHYSICAL EXAMINATION: ECOG PERFORMANCE STATUS: 4 - Bedbound  Vitals:   11/02/23 0423 11/02/23 1001  BP: 136/66 135/71  Pulse: 91 (!) 101  Resp: 20   Temp: 98.4 F (36.9 C)   SpO2: 99%    Filed Weights   11/01/23 1526  Weight: 129 lb (58.5 kg)    GENERAL: alert, +frail +cachectic +chronically ill-appearing SKIN: +Pale skin color, +sacral ulcer EYES: normal, conjunctiva are pink and non-injected, sclera clear OROPHARYNX: no exudate, no erythema and lips, buccal mucosa, and tongue normal  NECK: supple, thyroid  normal size, non-tender, without nodularity LYMPH: no palpable lymphadenopathy in the cervical, axillary or inguinal LUNGS: clear to auscultation and percussion with normal breathing effort HEART: regular rate & rhythm and no murmurs and +1 bilateral foot edema ABDOMEN: abdomen soft, non-tender and normal bowel sounds MUSCULOSKELETAL: +Unable to ambulate or stand PSYCH: alert & oriented x 3 with fluent speech NEURO: no focal motor/sensory deficits   All questions were answered. The patient knows to call the clinic with any problems, questions or concerns.  The total time spent in the appointment was 40 minutes encounter with patient including review of chart and various tests results, discussions about plan of care and coordination of care plan  [name], NP 11/02/2023 10:27 AM    Labs Reviewed:  Lab Results  Component Value Date   WBC 7.1 11/02/2023   HGB 7.7 (L)  11/02/2023   HCT 23.6 (L) 11/02/2023   MCV 90.4 11/02/2023   PLT 82 (L) 11/02/2023   Recent Labs    10/07/23 1539 10/07/23 2151 10/17/23 0629 10/19/23 0539 11/01/23 1544 11/02/23 0607  NA SPECIMEN DILUTED   < > 137 135 134* 135  K SPECIMEN DILUTED   < > 4.2 4.1 5.7* 5.0  CL SPECIMEN DILUTED   < > 104 102 103 104  CO2 SPECIMEN DILUTED   < > 22 23 17* 17*  GLUCOSE SPECIMEN DILUTED   < > 96 114* 359* 319*  BUN SPECIMEN DILUTED   < > 10 11 62* 56*  CREATININE SPECIMEN DILUTED   < > 0.73 0.79 1.34* 1.11  CALCIUM  SPECIMEN DILUTED   < > 8.0* 8.3* 8.2* 7.9*  GFRNONAA SPECIMEN DILUTED   < > >60 >60 58* >60  GFRAA SPECIMEN DILUTED  --   --   --   --   --   PROT SPECIMEN DILUTED   < > 4.6*  --  5.3* 4.7*  ALBUMIN SPECIMEN DILUTED   < > 3.1*  --  3.3* 3.0*  AST SPECIMEN DILUTED   < > 28  --  23 18  ALT SPECIMEN DILUTED   < > 12  --  18 14  ALKPHOS SPECIMEN DILUTED   < > 87  --  77 69  BILITOT SPECIMEN DILUTED   < > 1.1  --  0.9 1.1   < > = values in this interval not displayed.    Studies Reviewed:  CT HEAD WO CONTRAST ( ) Result Date: 10/07/2023 CLINICAL DATA:  Recent fall with headaches, initial encounter EXAM: CT HEAD WITHOUT CONTRAST TECHNIQUE: Contiguous axial images were obtained from the base of the skull through the vertex without intravenous contrast. RADIATION DOSE REDUCTION: This exam was performed according to the departmental dose-optimization program which includes automated exposure control, adjustment of the mA and/or kV according to patient size and/or use of iterative reconstruction technique. COMPARISON:  None Available. FINDINGS: Brain: No evidence of acute infarction, hemorrhage, hydrocephalus, extra-axial collection or mass lesion/mass effect. Mild atrophic changes are noted. Vascular: No hyperdense vessel or unexpected calcification. Skull: Normal. Negative for fracture or focal lesion. Sinuses/Orbits: No acute finding. Other: None. IMPRESSION: No acute intracranial  abnormality noted. Electronically Signed   By: Oneil Devonshire M.D.   On: 10/07/2023 21:12   CT ABDOMEN PELVIS W CONTRAST Result Date: 10/07/2023 CLINICAL DATA:  Acute nonlocalized abdominal pain. History of CLL. Hypotensive [facility] with tachycardia and decreased oxygenation. EXAM: CT ABDOMEN AND PELVIS WITH CONTRAST TECHNIQUE: Multidetector CT imaging of the abdomen and pelvis was performed using the standard protocol following bolus administration of intravenous contrast. RADIATION DOSE REDUCTION: This exam was performed according to the departmental dose-optimization program which includes automated exposure control, adjustment of the mA and/or kV according to patient size and/or use of iterative reconstruction technique. CONTRAST:  OMNIPAQUE  IOHEXOL  300 MG/ML  SOLN COMPARISON:  08/06/2023 FINDINGS: Lower chest: Large left and small right pleural effusions. Basilar atelectasis or consolidation also greater on the left. Cardiac enlargement. Hepatobiliary: No focal liver abnormality is seen. No gallstones,  gallbladder wall thickening, or biliary dilatation. Pancreas: Unremarkable. No pancreatic ductal dilatation or surrounding inflammatory changes. Spleen: Spleen is enlarged. Focal wedge-shaped defect in the posterior periphery likely representing splenic infarct. Adrenals/Urinary Tract: No adrenal gland nodules. Kidneys are symmetrical. No hydronephrosis or hydroureter. Bladder is normal. Stomach/Bowel: Stomach, small bowel, and colon are not abnormally distended. No wall thickening or inflammatory infiltration. Appendix is normal. Vascular/Lymphatic: Calcification of the aorta. No aneurysm. There is extensive bulky lymphadenopathy involving the retrocrural space, celiac axis, splenic hilar region, throughout the retroperitoneum, throughout the mesentery, bilateral anterior and posterior pelvic chains, in the sciatic region, and in the groin regions. Largest retroperitoneal lymph node mass measures  about 8.9 x 8.5 cm on transverse dimension. Measured at a similar level, there is no significant change since the prior study. Reproductive: Prostate is unremarkable. Other: Small amount of free fluid along the pericolic gutters. No free air. Musculoskeletal: Degenerative changes in the spine. Lucent lesions demonstrated in the sacrum and pelvis, most prominent on the right. Focal lesions measure up to about 1.7 cm diameter. These lesions are progressive since prior study. IMPRESSION: 1. Extensive bulky lymphadenopathy throughout the lower chest, abdomen, pelvis, and groin regions. Lymphadenopathy is similar to the previous study. 2. Splenic enlargement with peripheral splenic infarct. This is likely due to lymphomatous involvement. 3. Small lucent lesions in the pelvis may represent lymphomatous involvement. Bone lesions appear progressive since prior study. 4. Moderate left and small right pleural effusions with basilar atelectasis, progressing since prior study. Cardiac enlargement. Electronically Signed   By: Elsie Gravely M.D.   On: 10/07/2023 17:51   DG Chest Port 1 View Result Date: 10/07/2023 CLINICAL DATA:  Questionable sepsis. EXAM: PORTABLE CHEST 1 VIEW COMPARISON:  Chest CT dated 08/07/2023. FINDINGS: Small left pleural effusion and left lung base atelectasis or infiltrate. No pneumothorax. The cardiac silhouette is within limits. Right paratracheal density consistent with adenopathy. No acute osseous pathology. IMPRESSION: 1. Small left pleural effusion and left lung base atelectasis or infiltrate. 2. Right paratracheal adenopathy. Electronically Signed   By: Vanetta Chou M.D.   On: 10/07/2023 15:59

## 2023-11-03 ENCOUNTER — Inpatient Hospital Stay: Admitting: Dietician

## 2023-11-03 ENCOUNTER — Inpatient Hospital Stay

## 2023-11-03 ENCOUNTER — Inpatient Hospital Stay (HOSPITAL_COMMUNITY)

## 2023-11-03 ENCOUNTER — Inpatient Hospital Stay: Admitting: Hematology

## 2023-11-03 DIAGNOSIS — D649 Anemia, unspecified: Secondary | ICD-10-CM | POA: Diagnosis not present

## 2023-11-03 DIAGNOSIS — E43 Unspecified severe protein-calorie malnutrition: Secondary | ICD-10-CM | POA: Insufficient documentation

## 2023-11-03 LAB — GLUCOSE, CAPILLARY
Glucose-Capillary: 185 mg/dL — ABNORMAL HIGH (ref 70–99)
Glucose-Capillary: 187 mg/dL — ABNORMAL HIGH (ref 70–99)
Glucose-Capillary: 195 mg/dL — ABNORMAL HIGH (ref 70–99)
Glucose-Capillary: 149 mg/dL — ABNORMAL HIGH (ref 70–99)
Glucose-Capillary: 151 mg/dL — ABNORMAL HIGH (ref 70–99)
Glucose-Capillary: 115 mg/dL — ABNORMAL HIGH (ref 70–99)

## 2023-11-03 LAB — BASIC METABOLIC PANEL WITH GFR
Anion gap: 13 (ref 5–15)
BUN: 49 mg/dL — ABNORMAL HIGH (ref 8–23)
CO2: 19 mmol/L — ABNORMAL LOW (ref 22–32)
Calcium: 8 mg/dL — ABNORMAL LOW (ref 8.9–10.3)
Chloride: 103 mmol/L (ref 98–111)
Creatinine, Ser: 1.06 mg/dL (ref 0.61–1.24)
GFR, Estimated: >60 mL/min (ref >60)
Glucose, Bld: 185 mg/dL — ABNORMAL HIGH (ref 70–99)
Potassium: 4.4 mmol/L (ref 3.5–5.1)
Sodium: 135 mmol/L (ref 135–145)

## 2023-11-03 LAB — CBC
HCT: 25.2 % — ABNORMAL LOW (ref 39.0–52.0)
Hemoglobin: 8 g/dL — ABNORMAL LOW (ref 13.0–17.0)
MCH: 29.1 pg (ref 26.0–34.0)
MCHC: 31.7 g/dL (ref 30.0–36.0)
MCV: 91.6 fL (ref 80.0–100.0)
Platelets: 82 K/uL — ABNORMAL LOW (ref 150–400)
RBC: 2.75 MIL/uL — ABNORMAL LOW (ref 4.22–5.81)
RDW: 16.9 % — ABNORMAL HIGH (ref 11.5–15.5)
WBC: 5.2 K/uL (ref 4.0–10.5)
nRBC: 0 % (ref 0.0–0.2)

## 2023-11-03 MED ORDER — DOXEPIN HCL 25 MG PO CAPS
25.0000 mg | ORAL_CAPSULE | Freq: Every day | ORAL | Status: DC
  Administered 2023-11-03 – 2023-11-07 (×5): 25 mg via ORAL
  Filled 2023-11-03 (×6): qty 1

## 2023-11-03 MED ORDER — EMPAGLIFLOZIN 10 MG PO TABS
10.0000 mg | ORAL_TABLET | Freq: Every day | ORAL | Status: DC
  Administered 2023-11-03 – 2023-11-04 (×2): 10 mg via ORAL
  Filled 2023-11-03 (×2): qty 1

## 2023-11-03 MED ORDER — SODIUM CHLORIDE 0.9 % IV BOLUS
500.0000 mL | Freq: Once | INTRAVENOUS | Status: AC
  Administered 2023-11-03: 500 mL via INTRAVENOUS

## 2023-11-03 MED ORDER — SIMVASTATIN 20 MG PO TABS
20.0000 mg | ORAL_TABLET | Freq: Every day | ORAL | Status: DC
  Administered 2023-11-03 – 2023-11-05 (×3): 20 mg via ORAL
  Filled 2023-11-03 (×3): qty 1

## 2023-11-03 MED ORDER — METHOCARBAMOL 500 MG PO TABS
500.0000 mg | ORAL_TABLET | Freq: Four times a day (QID) | ORAL | Status: DC | PRN
  Administered 2023-11-03 – 2023-11-08 (×2): 500 mg via ORAL
  Filled 2023-11-03 (×2): qty 1

## 2023-11-03 MED ORDER — INSULIN GLARGINE 100 UNIT/ML ~~LOC~~ SOLN
12.0000 [IU] | Freq: Every day | SUBCUTANEOUS | Status: DC
  Filled 2023-11-03: qty 0.12

## 2023-11-03 MED ORDER — SODIUM CHLORIDE 0.9 % IV SOLN: INTRAVENOUS | Status: AC

## 2023-11-03 NOTE — Progress Notes (Addendum)
 Physical Therapy Wound Evaluation and Treatment Patient Details  Name: Tyler Scott MRN: [id number] Date of Birth: 1955/08/21  Today's Date: 11/03/2023 Time: 8577-8555 Time Calculation (min): 22 min  Subjective  Subjective Assessment Patient and Family Stated Goals: less pain Date of Onset: 10/03/23 (per chart began  about 1 month ago) Prior Treatments: dressing changes  Pain Score:  10/10 with movement, rolling, peri-care and wound cleansing  Patient is a 68 year old male with history of insulin -dependent type type II, hypertension, CLL, ITP, pancytopenia, hypertension, hyperlipidemia who presented to the emergency department from SNF  for evaluation of weakness, abnormal labs.  Oncology following.  Also found to have sacral ulcer, general surgery consulted, PT requested for hydrotherapy.   Wound Therapy - Assess/Plan/Recommendations   pt presents to PT with unstageable/ eschar covered sacral/ buttocks pressure injury;  no further hydrotherapy intervention recommended at this time; wound was cleaned, santyl  applied and covered with foam dressing. recommend chemical debridement and dressing changes by RN going forward.   Recommend Palliative Care consult for GOC and pain control.        11/03/23 1536  Subjective Assessment  Patient and Family Stated Goals less pain  Date of Onset 10/03/23 (Per chart began ~ 1 month ago)       Prior Treatments dressing changes     Evaluation and Treatment  Evaluation and Treatment Procedures Explained to Patient/Family Yes  Evaluation and Treatment Procedures agreed to  Wound 11/03/23 1422 Pressure Injury Sacrum Medial;Left Unstageable - Full thickness tissue loss in which the base of the injury is covered by slough (yellow, tan, gray, green or brown) and/or eschar (tan, brown or black) in the wound bed.    Date First Assessed/Time First Assessed: 11/03/23 1422   Present on Original Admission: Yes  Primary Wound Type: Pressure Injury   Location: Sacrum  Location Orientation: Medial;Left  Staging: Unstageable - Full thickness tissue loss in which the base ...  Site / Wound Assessment Painful;Granulation tissue;Brown;Black;Pink;Red  Peri-wound Assessment Intact  Wound Length (cm) 13.4 cm  Wound Width (cm) 8 cm  Wound Surface Area (cm^2) 84.19 cm^2  Drainage Description No odor  Drainage Amount None  Treatment Cleansed;Off loading;Other (Comment) (santyl )  Dressing Type Foam - Lift dressing to assess site every shift  Dressing Changed Changed  State of Healing Eschar  % Wound base Red or Granulating 20%  % Wound base Black/Eschar 80%  Non-staged Wound Description Full thickness  Wound Therapy - Assess/Plan/Recommendations   pt presents to PT with unstageable/ eschar covered sacral/ buttocks pressure injury;  no further hydrotherapy intervention recommended at this time; wound was cleaned, santyl  applied and covered with foam dressing. recommend dressing changes by RN  and chemical debridement.  Wound Therapy - Functional Problem List limited mobility  Factors Delaying/Impairing Wound Healing Incontinence;Immobility;Multiple medical problems  Hydrotherapy Plan Debridement;Patient/family education;Dressing change  Wound Therapy - Current Recommendations  (WOC and surgery consulted)  Wound Therapy - Follow Up Recommendations dressing changes by RN  Wound Therapy Goals - Improve the function of patient's integumentary system by progressing the wound(s) through the phases of wound healing by:  Goals/treatment plan/discharge plan were made with and agreed upon by patient/family Tyler Scott, PT  Acute Rehab Dept [facility]) [phone]  11/03/2023        [facility] 11/03/2023, 3:37 PM

## 2023-11-03 NOTE — Progress Notes (Addendum)
 Pt is very lethargic. RN reports a change in mentation and he says he feels hypoglycemia. Last BG check was 149. RN asked Zoe (Rapid) to come assess.  BP 93/56 HR 104 MD Adhikari notified, CT of head & IVF Bolus ordered. Rapid at the bedside.

## 2023-11-03 NOTE — Telephone Encounter (Signed)
 Oncology Pharmacist Encounter  Received notification from RN who spoke to the SNF that the patient is residing and patient had started the venetoclax  when he was discharged and over at the SNF. This was ~10/24/23 or 10/25/23. Patient is currently back admitted at [facility] and is not taking the venetoclax . Discussed with RN that depending on duration of hospitalization will determine if patient would need to restart the venetoclax  starter pack over.    Patient will take the venetoclax  with rituximab . Currently treatment plan is still bendamustine  + rituximab  and will need editing piror to patient receiving rituximab  + venetoclax .   Anapaola Kinsel, PharmD Hematology/Oncology Clinical Pharmacist [facility] Oral Chemotherapy Navigation [facility] [phone]

## 2023-11-03 NOTE — Progress Notes (Signed)
 Chaplain responded to referral from [facility].  I met with Tyler Scott who, when I asked if that was his name, responded by saying I used to be. We explored what that meant to him, including issues around grief. He named some fears, primarily that he might not be able to walk again. I provided emotional support through compassionate presence and reflective listening.   Because of pain medication he had received 10-15 minutes prior, our visit was limited in time and [name] needed to rest. I assured him we are he to support him as further needs arise and left him to sleep. Chaplains will continue to follow.

## 2023-11-03 NOTE — Plan of Care (Signed)

## 2023-11-03 NOTE — Plan of Care (Signed)

## 2023-11-03 NOTE — Progress Notes (Signed)
 PROGRESS NOTE  Tyler Scott  FMW:[id number] DOB: 1955-04-10 DOA: 11/01/2023 PCP: [facility]   Brief Narrative: Patient is a 68 year old male with history of insulin -dependent type type II, hypertension, CLL, ITP, pancytopenia, hypertension, hyperlipidemia who presented to the emergency department from SnF  for evaluation of weakness, abnormal labs.  On presentation ,hemoglobin was 5.2, platelets of 76, creatinine of 1.3.  He was hemodynamically stable on presentation.  2 units  of blood transfusion ordered.  Oncology following.  Also found to have sacral ulcer, general surgery consulted, PT requested for hydrotherapy.  Assessment & Plan:  Principal Problem:   Symptomatic anemia Active Problems:   Lymphoma, low grade (HCC)   CLL (chronic lymphocytic leukemia) (HCC)   Essential hypertension   Hyperlipidemia   Chronic ITP (idiopathic thrombocytopenia) (HCC)   Hyperkalemia   Insulin  dependent type 2 diabetes mellitus (HCC)   Cancer associated pain   Pressure injury of skin   Diarrhea   Protein-calorie malnutrition, severe  Symptomatic anemia: Has history of chronic pancytopenia secondary to CLL.  Hemoglobin was in the range of 5 on presentation.  Given 2 units of blood transfusion.  Continue to monitor H&H.  No evidence of acute blood loss.  Repeat hemoglobin is in the range of 8.  CLL/pancytopenia: History of CLL, was on different therapies.  Follows with Dr. Onesimo .oncology suspecting transformation of CLL to aggressive lymphoma.  Suspicion of bone marrow involvement.  Has chronic thrombocytopenia, suspected to be from chronic ITP.  He is on chemotherapy.Oncology consulted and following  AKI/hyperkalemia: Given Lokelma  for hyperkalemia.  Baseline creatinine normal.  CO2 of 17, likely associated with diarrhea.  Continue to monitor.  Started on gentle bicarb drip.  AKI has resolved.Fluid stopped    Hypertension: On metoprolol    Insulin -dependent diabetes type 2: Takes  insulin  at home.  Follow-up A1c level.  Hyperglycemic.  Continue current insulin  regimen.  Diarrhea: Will get C. difficile and GI pathogen panel.  No fever or leukocytosis.  Abdomen slightly distended today but has good bowel sounds.  Having diarrhea.  Will check x-ray of the abdomen  Failure to thrive/generalized weakness: PT consulted.  Nutritionist consulted.  Patient is cachectic and severely malnourished.  Has poor appetite.TOC consulted.  He needs to get back to rehab when he is discharged from here  Sacral pressure ulcer: Initially developed while at SNF.  Wound care nurse saw him here and recommended general surgery consultation for possible debridement . general surgery consulted.  General surgery recommended hydrotherapy and signed off.  PT following for hydrotherapy.       Nutrition Problem: Severe Malnutrition Etiology: chronic illness, cancer and cancer related treatments Wound 10/07/23 2200 Pressure Injury Coccyx Unstageable - Full thickness tissue loss in which the base of the injury is covered by slough (yellow, tan, gray, green or brown) and/or eschar (tan, brown or black) in the wound bed. (Active)     Wound 11/01/23 1820 Pressure Injury Sacrum Bilateral Unstageable - Full thickness tissue loss in which the base of the injury is covered by slough (yellow, tan, gray, green or brown) and/or eschar (tan, brown or black) in the wound bed. (Active)    DVT prophylaxis:SCDs Start: 11/01/23 1736     Code Status: Full Code  Family Communication: Discussed with wife at bedside on 9/23  Patient status: Inpatient  Patient is from : SNF  Anticipated discharge to: SNF  Estimated DC date: 2-3 days   Consultants: Oncology  Procedures:None  Antimicrobials:  Anti-infectives (From admission, onward)  Start     Dose/Rate Route Frequency Ordered Stop   11/02/23 1000  acyclovir  (ZOVIRAX ) tablet 400 mg        400 mg Oral Daily 11/01/23 1858         Subjective: Patient  seen and examined at bedside today.  Very weak and deconditioned but overall comfortable.  He said he had few loose stools today.  He complains of some abdominal discomfort.  Abdomen is slightly distended but has good bowel sounds.  We discussed about doing a abdominal x-ray.  PT seeing for hydrotherapy today.  Hemoglobin was stable in the range of 8  Objective: Vitals:   11/02/23 1001 11/02/23 1347 11/02/23 2052 11/03/23 0533  BP: 135/71 129/64 (!) 143/73 (!) 144/75  Pulse: (!) 101 91 (!) 102 (!) 105  Resp:  18 16 16   Temp:  97.8 F (36.6 C) 99 F (37.2 C) 99.3 F (37.4 C)  TempSrc:  Oral Oral Oral  SpO2:  100% 100% 99%  Weight:      Height:        Intake/Output Summary (Last 24 hours) at 11/03/2023 1139 Last data filed at 11/03/2023 0500 Gross per 24 hour  Intake 452.64 ml  Output 1850 ml  Net -1397.36 ml   Filed Weights   11/01/23 1526  Weight: 58.5 kg    Examination:   General exam: Overall comfortable, not in distress, very weak and deconditioned, cachectic HEENT: PERRL Respiratory system:  no wheezes or crackles  Cardiovascular system: S1 & S2 heard, RRR.  Gastrointestinal system: Abdomen is slightly distended, has good bowel sounds.  Nontender Central nervous system: Alert and oriented Extremities: No edema, no clubbing ,no cyanosis Skin: Sacral pressure ulcer     Data Reviewed: I have personally reviewed following labs and imaging studies  CBC: Recent Labs  Lab 11/01/23 1544 11/02/23 0607 11/03/23 0602  WBC 4.6 7.1 5.2  HGB 5.2* 7.7* 8.0*  HCT 16.7* 23.6* 25.2*  MCV 93.8 90.4 91.6  PLT 76* 82* 82*   Basic Metabolic Panel: Recent Labs  Lab 11/01/23 1544 11/02/23 0607 11/03/23 0602  NA 134* 135 135  K 5.7* 5.0 4.4  CL 103 104 103  CO2 17* 17* 19*  GLUCOSE 359* 319* 185*  BUN 62* 56* 49*  CREATININE 1.34* 1.11 1.06  CALCIUM  8.2* 7.9* 8.0*     No results found for this or any previous visit (from the past 240 hours).   Radiology  Studies: No results found.  Scheduled Meds:  (feeding supplement) PROSource Plus  30 mL Oral TID BM   sodium chloride    Intravenous Once   acyclovir   400 mg Oral Daily   allopurinol   300 mg Oral Daily   collagenase    Topical Daily   doxepin   25 mg Oral QHS   gabapentin   100 mg Oral TID   HYDROmorphone   2 mg Oral QHS   insulin  aspart  0-5 Units Subcutaneous QHS   insulin  aspart  0-9 Units Subcutaneous TID WC   [START ON 11/04/2023] insulin  glargine  12 Units Subcutaneous Daily   metoprolol  tartrate  12.5 mg Oral BID   nutrition supplement (JUVEN)  1 packet Oral BID WC   pantoprazole   40 mg Oral QHS   sertraline   25 mg Oral Daily   simvastatin   20 mg Oral QHS   Continuous Infusions:   LOS: 1 day   Ivonne Mustache, MD [facility] P9/24/2025, 11:39 AM

## 2023-11-04 ENCOUNTER — Encounter (HOSPITAL_COMMUNITY): Payer: Self-pay

## 2023-11-04 ENCOUNTER — Inpatient Hospital Stay (HOSPITAL_COMMUNITY)

## 2023-11-04 ENCOUNTER — Inpatient Hospital Stay

## 2023-11-04 DIAGNOSIS — R609 Edema, unspecified: Secondary | ICD-10-CM | POA: Diagnosis not present

## 2023-11-04 DIAGNOSIS — D649 Anemia, unspecified: Secondary | ICD-10-CM | POA: Diagnosis not present

## 2023-11-04 LAB — GLUCOSE, CAPILLARY
Glucose-Capillary: 110 mg/dL — ABNORMAL HIGH (ref 70–99)
Glucose-Capillary: 164 mg/dL — ABNORMAL HIGH (ref 70–99)
Glucose-Capillary: 167 mg/dL — ABNORMAL HIGH (ref 70–99)
Glucose-Capillary: 201 mg/dL — ABNORMAL HIGH (ref 70–99)

## 2023-11-04 LAB — CBC
HCT: 22.6 % — ABNORMAL LOW (ref 39.0–52.0)
Hemoglobin: 7.1 g/dL — ABNORMAL LOW (ref 13.0–17.0)
MCH: 28.9 pg (ref 26.0–34.0)
MCHC: 31.4 g/dL (ref 30.0–36.0)
MCV: 91.9 fL (ref 80.0–100.0)
Platelets: 73 K/uL — ABNORMAL LOW (ref 150–400)
RBC: 2.46 MIL/uL — ABNORMAL LOW (ref 4.22–5.81)
RDW: 17.1 % — ABNORMAL HIGH (ref 11.5–15.5)
WBC: 4.9 K/uL (ref 4.0–10.5)
nRBC: 0 % (ref 0.0–0.2)

## 2023-11-04 LAB — PREPARE RBC (CROSSMATCH)

## 2023-11-04 MED ORDER — IOHEXOL 300 MG/ML  SOLN
100.0000 mL | Freq: Once | INTRAMUSCULAR | Status: AC | PRN
Start: 2023-11-04 — End: 2023-11-04
  Administered 2023-11-04: 100 mL via INTRAVENOUS

## 2023-11-04 MED ORDER — SODIUM CHLORIDE 0.9% IV SOLUTION: Freq: Once | INTRAVENOUS | Status: AC

## 2023-11-04 MED ORDER — IOHEXOL 9 MG/ML PO SOLN
1000.0000 mL | ORAL | Status: AC
  Administered 2023-11-04: 1000 mL via ORAL

## 2023-11-04 NOTE — Plan of Care (Signed)
  Problem: Pain Managment: Goal: General experience of comfort will improve and/or be controlled Outcome: Progressing   Problem: Safety: Goal: Ability to remain free from injury will improve Outcome: Progressing   Problem: Skin Integrity: Goal: Risk for impaired skin integrity will decrease Outcome: Progressing   Problem: Nutritional: Goal: Maintenance of adequate nutrition will improve Outcome: Progressing

## 2023-11-04 NOTE — Progress Notes (Addendum)
 PROGRESS NOTE  ARCHIBALD MARCHETTA  FMW:[id number] DOB: 1955-07-01 DOA: 11/01/2023 PCP: [facility]   Brief Narrative: Patient is a 68 year old male with history of insulin -dependent type type II, hypertension, CLL, ITP, pancytopenia, hypertension, hyperlipidemia who presented to the emergency department from [facility]  for evaluation of weakness, abnormal labs.  On presentation ,hemoglobin was 5.2, platelets of 76, creatinine of 1.3.  He was hemodynamically stable on presentation.  2 units  of blood transfusion ordered.  Oncology was following.  Also found to have sacral ulcer, general surgery consulted, PT doing hydrotherapy.  Assessment & Plan:  Principal Problem:   Symptomatic anemia Active Problems:   Lymphoma, low grade (HCC)   CLL (chronic lymphocytic leukemia) (HCC)   Essential hypertension   Hyperlipidemia   Chronic ITP (idiopathic thrombocytopenia) (HCC)   Hyperkalemia   Insulin  dependent type 2 diabetes mellitus (HCC)   Cancer associated pain   Pressure injury of skin   Diarrhea   Protein-calorie malnutrition, severe  Symptomatic anemia: Has history of chronic pancytopenia secondary to CLL.  Hemoglobin was in the range of 5 on presentation.  Given 2 units of blood transfusion.  Continue to monitor H&H.  No evidence of acute blood loss.  Repeat hemoglobin is in the range of 7.  Being transfused 2 events of PRBC again.  Total of 4 units so far  CLL/pancytopenia: History of CLL, was on different therapies.  Follows with Dr. Onesimo .oncology suspecting transformation of CLL to aggressive lymphoma.  Suspicion of bone marrow involvement.  Has chronic thrombocytopenia, suspected to be from chronic ITP.  He is on chemotherapy.Oncology consulted and following.  Patient is cachectic, malnourished, very weak and deconditioned.  Do not think he can tolerate any more chemotherapy.  I have requested oncology to follow him up.  He may be candidate for hospice.Needs goals of  care  AKI/hyperkalemia:Resolved    Hypertension: Blood pressure soft.  Continue IV fluid for today.  Metoprolol  stopped   Insulin -dependent diabetes type 2: Takes insulin  at home.  Continue sliding scale only.  Hemoglobin A1c was 6.5  Diarrhea: Ordered C. difficile and GI pathogen panel.  No fever or leukocytosis.  Abdomen slightly distended today but has good bowel sounds.  Diarrhea has stopped.  Abdomen x-ray done on 9/24 did not show any abnormalities.  Failure to thrive/generalized weakness: PT consulted.  Nutritionist consulted.  Patient is cachectic and severely malnourished.  Has poor appetite.TOC consulted.  Possibly needs to get back to rehab when he is discharged from here  Left lower extremity edema: Will check venous Doppler to rule out DVT  Sacral pressure ulcer: Initially developed while at SNF.  Wound care nurse saw him here and recommended general surgery consultation for possible debridement . general surgery consulted.  General surgery recommended hydrotherapy and signed off.  PT following for hydrotherapy.       Nutrition Problem: Severe Malnutrition Etiology: chronic illness, cancer and cancer related treatments Wound 10/07/23 2200 Pressure Injury Coccyx Unstageable - Full thickness tissue loss in which the base of the injury is covered by slough (yellow, tan, gray, green or brown) and/or eschar (tan, brown or black) in the wound bed. (Active)     Wound 11/01/23 1820 Pressure Injury Sacrum Bilateral Unstageable - Full thickness tissue loss in which the base of the injury is covered by slough (yellow, tan, gray, green or brown) and/or eschar (tan, brown or black) in the wound bed. (Active)     Wound 11/03/23 1422 Pressure Injury Sacrum Medial;Left Unstageable -  Full thickness tissue loss in which the base of the injury is covered by slough (yellow, tan, gray, green or brown) and/or eschar (tan, brown or black) in the wound bed. (Active)    DVT prophylaxis:SCDs Start:  11/01/23 1736     Code Status: Full Code  Family Communication: Discussed with wife on phone on 9/25  Patient status: Inpatient  Patient is from : SNF  Anticipated discharge to: SNF  Estimated DC date: 2-3 days   Consultants: Oncology  Procedures:None  Antimicrobials:  Anti-infectives (From admission, onward)    Start     Dose/Rate Route Frequency Ordered Stop   11/02/23 1000  acyclovir  (ZOVIRAX ) tablet 400 mg        400 mg Oral Daily 11/01/23 1858         Subjective: Patient seen and examined at bedside today.  Hemodynamically stable but very weak and deconditioned.  Denies any abdomen pain or diarrhea today.  Complains of some cramps on the left lower extremity.  Blood pressure was soft so was started on gentle IV fluids yesterday.  PT started hydro therapy  Objective: Vitals:   11/04/23 0520 11/04/23 0844 11/04/23 0942 11/04/23 1005  BP: (!) 99/58 (!) 109/52 122/60 123/81  Pulse: 86 86 96 97  Resp: 18 18 18 16   Temp: (!) 97.5 F (36.4 C) 97.8 F (36.6 C) 97.7 F (36.5 C) 97.6 F (36.4 C)  TempSrc: Oral Oral Oral Oral  SpO2: 100% 100% 100% 100%  Weight:      Height:        Intake/Output Summary (Last 24 hours) at 11/04/2023 1138 Last data filed at 11/03/2023 2115 Gross per 24 hour  Intake 19.3 ml  Output 450 ml  Net -430.7 ml   Filed Weights   11/01/23 1526  Weight: 58.5 kg    Examination:  General exam: Overall comfortable, not in distress, very weak and deconditioned, cachectic HEENT: PERRL Respiratory system:  no wheezes or crackles  Cardiovascular system: S1 & S2 heard, RRR.  Gastrointestinal system: Abdomen is slight distended, soft and nontender. Central nervous system: Alert and oriented Extremities: No edema, no clubbing ,no cyanosis Skin: Sacral pressure ulcer     Data Reviewed: I have personally reviewed following labs and imaging studies  CBC: Recent Labs  Lab 11/01/23 1544 11/02/23 0607 11/03/23 0602 11/04/23 0620  WBC  4.6 7.1 5.2 4.9  HGB 5.2* 7.7* 8.0* 7.1*  HCT 16.7* 23.6* 25.2* 22.6*  MCV 93.8 90.4 91.6 91.9  PLT 76* 82* 82* 73*   Basic Metabolic Panel: Recent Labs  Lab 11/01/23 1544 11/02/23 0607 11/03/23 0602  NA 134* 135 135  K 5.7* 5.0 4.4  CL 103 104 103  CO2 17* 17* 19*  GLUCOSE 359* 319* 185*  BUN 62* 56* 49*  CREATININE 1.34* 1.11 1.06  CALCIUM  8.2* 7.9* 8.0*     No results found for this or any previous visit (from the past 240 hours).   Radiology Studies: DG Abd 1 View Result Date: 11/03/2023 CLINICAL DATA:  Abdominal distension.  History of CLL. EXAM: ABDOMEN - 1 VIEW COMPARISON:  08/27/2023 FINDINGS: Bowel gas pattern is nonobstructive with a few air-filled nondilated small bowel loops over the right abdomen. No free peritoneal air. Mild degenerative change of the spine and hips. IMPRESSION: Nonobstructive bowel gas pattern. Electronically Signed   By: Toribio Agreste M.D.   On: 11/03/2023 12:05    Scheduled Meds:  (feeding supplement) PROSource Plus  30 mL Oral TID BM   sodium  chloride   Intravenous Once   acyclovir   400 mg Oral Daily   allopurinol   300 mg Oral Daily   collagenase    Topical Daily   doxepin   25 mg Oral QHS   empagliflozin   10 mg Oral Daily   gabapentin   100 mg Oral TID   HYDROmorphone   2 mg Oral QHS   insulin  aspart  0-5 Units Subcutaneous QHS   insulin  aspart  0-9 Units Subcutaneous TID WC   insulin  glargine  12 Units Subcutaneous Daily   nutrition supplement (JUVEN)  1 packet Oral BID WC   pantoprazole   40 mg Oral QHS   sertraline   25 mg Oral Daily   simvastatin   20 mg Oral QHS   Continuous Infusions:  sodium chloride  100 mL/hr at 11/04/23 0835     LOS: 2 days   Ivonne Mustache, MD [facility] P9/25/2025, 11:38 AM

## 2023-11-04 NOTE — Progress Notes (Signed)
 PT Cancellation Note  Patient Details Name: Tyler Scott MRN: [id number] DOB: 19-Jul-1955   Cancelled Treatment:    Reason Eval/Treat Not Completed: Patient at procedure or test/unavailable Patient in testing. Will check back another time.  [name] PT Acute Rehabilitation Services Office [phone]   [name] 11/04/2023, 2:46 PM

## 2023-11-04 NOTE — Progress Notes (Signed)
 Tyler Scott   DOB:22-Jun-1955   FM#:[id number]      ASSESSMENT & PLAN:  Tyler Scott. Tyler Scott is a 68 year old male patient with oncologic history significant for CLL.  Patient has had multiple hospital admissions.  He is now admitted with severe anemia and failure to thrive.  Follows with Medical Oncology/Dr. Onesimo.   Severe Anemia, symptomatic -- Likely multifactorial secondary to oral chemo, malignancy, poor nutrition -- Hemoglobin 5.2 on admission 9/22.  Status post PRBC transfusions this admission.  -- HGB dropped again to 7.1 today, seen with PRBC transfusion ongoing and tolerating well.    -- Recommend PRBC transfusion for HGB <7.0 -- Continue to monitor CBC with differential   Thrombocytopenia -- Platelets stable 73K -- Has been on weekly Nplate  4 mcg/kg.  Maintain platelet count >50K. -- Continue to monitor CBC with differential   CLL/SLL/Low grade Lymphoma -Diagnosed 2012 - Status post multiple lines of therapy including Bendamustine , rituximab , ibrutinib , Calquence .  Received Bendamustine  without rituximab  09/08/2023. - Previously with many interrupted cycles of therapy due to no-shows. - Most recently on venetoclax  + Rituxan . - Has been admitted to SNF. - LDH and uric acid, no evidence of tumor lysis -- Recommend Palliative for GOC discussion, although noted in documentation that patient has previously declined.   - Medical oncology/Dr. Onesimo following closely   Failure to thrive Weight loss/poor appetite - Patient is cachectic and frail appearing. - appreciate Dietician eval. - Continue supportive care   Lower extremity weakness with edema Impaired mobility - Edema 1+ of both feet - Patient is not ambulating at this time - Recommend PT support - Continue supportive care   Sacral pressure ulcer  - Likely developed at SNF - Continue supportive care   Diabetes - Monitor blood sugar levels - Continue insulin  coverage per protocol        Code  Status Full   Subjective:  Patient seen laying in bed with PRBC transfusion ongoing. Very ill-appearing, weak, and cachectic.  Reports unhappiness with diet.  States his legs still hurt.  Denies chest pain or acute GI symptoms.   Objective:   Intake/Output Summary (Last 24 hours) at 11/04/2023 1107 Last data filed at 11/03/2023 2115 Gross per 24 hour  Intake 19.3 ml  Output 450 ml  Net -430.7 ml     PHYSICAL EXAMINATION: ECOG PERFORMANCE STATUS: 4 - Bedbound  Vitals:   11/04/23 0942 11/04/23 1005  BP: 122/60 123/81  Pulse: 96 97  Resp: 18 16  Temp: 97.7 F (36.5 C) 97.6 F (36.4 C)  SpO2: 100% 100%   Filed Weights   11/01/23 1526  Weight: 129 lb (58.5 kg)    GENERAL: alert, +cachectic +frail +chronically ill-appearing SKIN: +pale skin color, +sacral ulcer EYES: normal, conjunctiva are pink and non-injected, sclera clear OROPHARYNX: no exudate, no erythema and lips, buccal mucosa, and tongue normal  NECK: supple, thyroid  normal size, non-tender, without nodularity LYMPH: no palpable lymphadenopathy in the cervical, axillary or inguinal LUNGS: clear to auscultation and percussion with normal breathing effort HEART: regular rate & rhythm and no murmurs and no lower extremity edema ABDOMEN: abdomen soft, non-tender and normal bowel sounds MUSCULOSKELETAL:+not ambulating PSYCH: alert & oriented x 3 with fluent speech NEURO: no focal motor/sensory deficits   All questions were answered. The patient knows to call the clinic with any problems, questions or concerns.   The total time spent in the appointment was 40 minutes encounter with patient including review of chart and various tests results, discussions  about plan of care and coordination of care plan  [name], NP 11/04/2023 11:07 AM    Labs Reviewed:  Lab Results  Component Value Date   WBC 4.9 11/04/2023   HGB 7.1 (L) 11/04/2023   HCT 22.6 (L) 11/04/2023   MCV 91.9 11/04/2023   PLT 73 (L) 11/04/2023    Recent Labs    10/07/23 1539 10/07/23 2151 10/17/23 0629 10/19/23 0539 11/01/23 1544 11/02/23 0607 11/03/23 0602  NA SPECIMEN DILUTED   < > 137   < > 134* 135 135  K SPECIMEN DILUTED   < > 4.2   < > 5.7* 5.0 4.4  CL SPECIMEN DILUTED   < > 104   < > 103 104 103  CO2 SPECIMEN DILUTED   < > 22   < > 17* 17* 19*  GLUCOSE SPECIMEN DILUTED   < > 96   < > 359* 319* 185*  BUN SPECIMEN DILUTED   < > 10   < > 62* 56* 49*  CREATININE SPECIMEN DILUTED   < > 0.73   < > 1.34* 1.11 1.06  CALCIUM  SPECIMEN DILUTED   < > 8.0*   < > 8.2* 7.9* 8.0*  GFRNONAA SPECIMEN DILUTED   < > >60   < > 58* >60 >60  GFRAA SPECIMEN DILUTED  --   --   --   --   --   --   PROT SPECIMEN DILUTED   < > 4.6*  --  5.3* 4.7*  --   ALBUMIN SPECIMEN DILUTED   < > 3.1*  --  3.3* 3.0*  --   AST SPECIMEN DILUTED   < > 28  --  23 18  --   ALT SPECIMEN DILUTED   < > 12  --  18 14  --   ALKPHOS SPECIMEN DILUTED   < > 87  --  77 69  --   BILITOT SPECIMEN DILUTED   < > 1.1  --  0.9 1.1  --    < > = values in this interval not displayed.    Studies Reviewed:  DG Abd 1 View Result Date: 11/03/2023 CLINICAL DATA:  Abdominal distension.  History of CLL. EXAM: ABDOMEN - 1 VIEW COMPARISON:  08/27/2023 FINDINGS: Bowel gas pattern is nonobstructive with a few air-filled nondilated small bowel loops over the right abdomen. No free peritoneal air. Mild degenerative change of the spine and hips. IMPRESSION: Nonobstructive bowel gas pattern. Electronically Signed   By: Toribio Agreste M.D.   On: 11/03/2023 12:05   CT HEAD WO CONTRAST ( ) Result Date: 10/07/2023 CLINICAL DATA:  Recent fall with headaches, initial encounter EXAM: CT HEAD WITHOUT CONTRAST TECHNIQUE: Contiguous axial images were obtained from the base of the skull through the vertex without intravenous contrast. RADIATION DOSE REDUCTION: This exam was performed according to the departmental dose-optimization program which includes automated exposure control, adjustment of the mA  and/or kV according to patient size and/or use of iterative reconstruction technique. COMPARISON:  None Available. FINDINGS: Brain: No evidence of acute infarction, hemorrhage, hydrocephalus, extra-axial collection or mass lesion/mass effect. Mild atrophic changes are noted. Vascular: No hyperdense vessel or unexpected calcification. Skull: Normal. Negative for fracture or focal lesion. Sinuses/Orbits: No acute finding. Other: None. IMPRESSION: No acute intracranial abnormality noted. Electronically Signed   By: Oneil Devonshire M.D.   On: 10/07/2023 21:12   CT ABDOMEN PELVIS W CONTRAST Result Date: 10/07/2023 CLINICAL DATA:  Acute nonlocalized abdominal pain. History of CLL. Hypotensive  [facility] with tachycardia and decreased oxygenation. EXAM: CT ABDOMEN AND PELVIS WITH CONTRAST TECHNIQUE: Multidetector CT imaging of the abdomen and pelvis was performed using the standard protocol following bolus administration of intravenous contrast. RADIATION DOSE REDUCTION: This exam was performed according to the departmental dose-optimization program which includes automated exposure control, adjustment of the mA and/or kV according to patient size and/or use of iterative reconstruction technique. CONTRAST:  OMNIPAQUE  IOHEXOL  300 MG/ML  SOLN COMPARISON:  08/06/2023 FINDINGS: Lower chest: Large left and small right pleural effusions. Basilar atelectasis or consolidation also greater on the left. Cardiac enlargement. Hepatobiliary: No focal liver abnormality is seen. No gallstones, gallbladder wall thickening, or biliary dilatation. Pancreas: Unremarkable. No pancreatic ductal dilatation or surrounding inflammatory changes. Spleen: Spleen is enlarged. Focal wedge-shaped defect in the posterior periphery likely representing splenic infarct. Adrenals/Urinary Tract: No adrenal gland nodules. Kidneys are symmetrical. No hydronephrosis or hydroureter. Bladder is normal. Stomach/Bowel: Stomach, small bowel, and colon  are not abnormally distended. No wall thickening or inflammatory infiltration. Appendix is normal. Vascular/Lymphatic: Calcification of the aorta. No aneurysm. There is extensive bulky lymphadenopathy involving the retrocrural space, celiac axis, splenic hilar region, throughout the retroperitoneum, throughout the mesentery, bilateral anterior and posterior pelvic chains, in the sciatic region, and in the groin regions. Largest retroperitoneal lymph node mass measures about 8.9 x 8.5 cm on transverse dimension. Measured at a similar level, there is no significant change since the prior study. Reproductive: Prostate is unremarkable. Other: Small amount of free fluid along the pericolic gutters. No free air. Musculoskeletal: Degenerative changes in the spine. Lucent lesions demonstrated in the sacrum and pelvis, most prominent on the right. Focal lesions measure up to about 1.7 cm diameter. These lesions are progressive since prior study. IMPRESSION: 1. Extensive bulky lymphadenopathy throughout the lower chest, abdomen, pelvis, and groin regions. Lymphadenopathy is similar to the previous study. 2. Splenic enlargement with peripheral splenic infarct. This is likely due to lymphomatous involvement. 3. Small lucent lesions in the pelvis may represent lymphomatous involvement. Bone lesions appear progressive since prior study. 4. Moderate left and small right pleural effusions with basilar atelectasis, progressing since prior study. Cardiac enlargement. Electronically Signed   By: Elsie Gravely M.D.   On: 10/07/2023 17:51   DG Chest Port 1 View Result Date: 10/07/2023 CLINICAL DATA:  Questionable sepsis. EXAM: PORTABLE CHEST 1 VIEW COMPARISON:  Chest CT dated 08/07/2023. FINDINGS: Small left pleural effusion and left lung base atelectasis or infiltrate. No pneumothorax. The cardiac silhouette is within limits. Right paratracheal density consistent with adenopathy. No acute osseous pathology. IMPRESSION: 1. Small  left pleural effusion and left lung base atelectasis or infiltrate. 2. Right paratracheal adenopathy. Electronically Signed   By: Vanetta Chou M.D.   On: 10/07/2023 15:59

## 2023-11-04 NOTE — Progress Notes (Signed)
 BLE venous duplex has been completed.   Results can be found under chart review under CV PROC. 11/04/2023 4:33 PM Belton Peplinski RVT, RDMS

## 2023-11-05 DIAGNOSIS — D649 Anemia, unspecified: Secondary | ICD-10-CM | POA: Diagnosis not present

## 2023-11-05 LAB — CBC
HCT: 28.8 % — ABNORMAL LOW (ref 39.0–52.0)
Hemoglobin: 9.3 g/dL — ABNORMAL LOW (ref 13.0–17.0)
MCH: 28.9 pg (ref 26.0–34.0)
MCHC: 32.3 g/dL (ref 30.0–36.0)
MCV: 89.4 fL (ref 80.0–100.0)
Platelets: 72 K/uL — ABNORMAL LOW (ref 150–400)
RBC: 3.22 MIL/uL — ABNORMAL LOW (ref 4.22–5.81)
RDW: 16.3 % — ABNORMAL HIGH (ref 11.5–15.5)
WBC: 5.6 K/uL (ref 4.0–10.5)
nRBC: 0 % (ref 0.0–0.2)

## 2023-11-05 LAB — BASIC METABOLIC PANEL WITH GFR
Anion gap: 14 (ref 5–15)
BUN: 73 mg/dL — ABNORMAL HIGH (ref 8–23)
CO2: 16 mmol/L — ABNORMAL LOW (ref 22–32)
Calcium: 8 mg/dL — ABNORMAL LOW (ref 8.9–10.3)
Chloride: 104 mmol/L (ref 98–111)
Creatinine, Ser: 1.39 mg/dL — ABNORMAL HIGH (ref 0.61–1.24)
GFR, Estimated: 56 mL/min — ABNORMAL LOW (ref >60)
Glucose, Bld: 157 mg/dL — ABNORMAL HIGH (ref 70–99)
Potassium: 4.3 mmol/L (ref 3.5–5.1)
Sodium: 134 mmol/L — ABNORMAL LOW (ref 135–145)

## 2023-11-05 LAB — TYPE AND SCREEN
ABO/RH(D): A POS
Antibody Screen: NEGATIVE
Unit division: 0
Unit division: 0
Unit division: 0
Unit division: 0

## 2023-11-05 LAB — PROTIME-INR
INR: 1.3 — ABNORMAL HIGH (ref 0.8–1.2)
Prothrombin Time: 16.5 s — ABNORMAL HIGH (ref 11.4–15.2)

## 2023-11-05 LAB — BPAM RBC
Blood Product Expiration Date: 202510172359
Blood Product Expiration Date: 202510182359
Blood Product Expiration Date: 202510182359
ISSUE DATE / TIME: 202509250942
ISSUE DATE / TIME: 202509251244
ISSUE DATE / TIME: 202509222237
ISSUE DATE / TIME: 202509230144
ISSUE DATE / TIME: 202510172359
Unit Type and Rh: 6200
Unit Type and Rh: 6200
Unit Type and Rh: 202510172359
Unit Type and Rh: 202510172359
Unit Type and Rh: 6200
Unit Type and Rh: 6200

## 2023-11-05 LAB — PSA: Prostatic Specific Antigen: 0.11 ng/mL (ref 0.00–4.00)

## 2023-11-05 LAB — GLUCOSE, CAPILLARY
Glucose-Capillary: 154 mg/dL — ABNORMAL HIGH (ref 70–99)
Glucose-Capillary: 142 mg/dL — ABNORMAL HIGH (ref 70–99)
Glucose-Capillary: 167 mg/dL — ABNORMAL HIGH (ref 70–99)
Glucose-Capillary: 193 mg/dL — ABNORMAL HIGH (ref 70–99)

## 2023-11-05 LAB — HEMOGLOBIN AND HEMATOCRIT, BLOOD
HCT: 29.7 % — ABNORMAL LOW (ref 39.0–52.0)
Hemoglobin: 10 g/dL — ABNORMAL LOW (ref 13.0–17.0)

## 2023-11-05 MED ORDER — SODIUM CHLORIDE 0.9 % IV SOLN
2.0000 g | Freq: Two times a day (BID) | INTRAVENOUS | Status: DC
  Administered 2023-11-05 – 2023-11-06 (×2): 2 g via INTRAVENOUS
  Filled 2023-11-05 (×2): qty 12.5

## 2023-11-05 MED ORDER — STERILE WATER FOR INJECTION IV SOLN
INTRAVENOUS | Status: DC
  Filled 2023-11-05 (×2): qty 150
  Filled 2023-11-05: qty 1000

## 2023-11-05 MED ORDER — METRONIDAZOLE 500 MG/100ML IV SOLN
500.0000 mg | Freq: Two times a day (BID) | INTRAVENOUS | Status: DC
  Administered 2023-11-05 – 2023-11-06 (×3): 500 mg via INTRAVENOUS
  Filled 2023-11-05 (×3): qty 100

## 2023-11-05 MED ORDER — CHLORHEXIDINE GLUCONATE CLOTH 2 % EX PADS
6.0000 | MEDICATED_PAD | Freq: Every day | CUTANEOUS | Status: DC
  Administered 2023-11-05 – 2023-11-06 (×2): 6 via TOPICAL

## 2023-11-05 MED ORDER — VANCOMYCIN HCL 1250 MG/250ML IV SOLN
1250.0000 mg | Freq: Once | INTRAVENOUS | Status: AC
Start: 2023-11-05 — End: 2023-11-05
  Administered 2023-11-05: 1250 mg via INTRAVENOUS
  Filled 2023-11-05: qty 250

## 2023-11-05 MED ORDER — VANCOMYCIN HCL 750 MG/150ML IV SOLN
750.0000 mg | INTRAVENOUS | Status: DC
  Administered 2023-11-06: 750 mg via INTRAVENOUS
  Filled 2023-11-05: qty 150

## 2023-11-05 MED ORDER — SODIUM CHLORIDE 0.9 % IV SOLN
2.0000 g | INTRAVENOUS | Status: AC
  Administered 2023-11-05: 2 g via INTRAVENOUS
  Filled 2023-11-05: qty 12.5

## 2023-11-05 NOTE — Progress Notes (Signed)
 Pharmacy Antibiotic Note  Tyler Scott is a 68 y.o. male with hx pressure wounds and CLL on bendamustine  and rituximab  PTA who presented to the ED on 11/01/2023 with generalized weakness. Abdominal/pelvis CT on 11/04/23 showed left iliopsoas abscess. Per [facility] msg with Dr. Jillian, start vancomycin , cefepime  and flagyl  for infection.  Today, 11/05/2023: - wbc wnl - Tmax 100 - scr up 1.39 (crcl~43), Na bicarb drip started by MD this morning  Plan: - vancomycin  1250 mg IV x1, then 750 mg IV q24h for est AUC 454 - cefepime  2gm q12h - flagyl  500 mg IV q12h - monitor renal function closely  __________________________________________  Height: 5' 8 (172.7 cm) Weight: 58.5 kg (129 lb) IBW/kg (Calculated) : 68.4  Temp (24hrs), Avg:98.1 F (36.7 C), Min:97.5 F (36.4 C), Max:100 F (37.8 C)  Recent Labs  Lab 11/01/23 1544 11/02/23 0607 11/03/23 0602 11/04/23 0620 11/05/23 0531  WBC 4.6 7.1 5.2 4.9 5.6  CREATININE 1.34* 1.11 1.06  --  1.39*    Estimated Creatinine Clearance: 42.7 mL/min (A) (by C-G formula based on SCr of 1.39 mg/dL (H)).    No Known Allergies   Thank you for allowing pharmacy to be a part of this patient's care.  Tyler Scott 11/05/2023 7:44 AM

## 2023-11-05 NOTE — Progress Notes (Signed)
 PROGRESS NOTE  Tyler Scott  FMW:[id number] DOB: [birth year] DOA: 11/01/2023 PCP: [facility]   Brief Narrative: Patient is a 68 year old male with history of insulin -dependent type type II, hypertension, CLL, ITP, pancytopenia, hypertension, hyperlipidemia who presented to the emergency department from [facility]  for evaluation of weakness, abnormal labs.  On presentation ,hemoglobin was 5.2, platelets of 76, creatinine of 1.3.  He was hemodynamically stable on presentation.  2 units  of blood transfusion ordered.  Oncology is following.  Also found to have sacral ulcer, general surgery consulted, PT doing hydrotherapy.  Abdomen CT/pelvis done on 9/25 showed left iliopsoas abscess.  General surgery/IR declined any kind of intervention saying it is not amenable for drainage.  Currently on broad-spectrum antibiotics .  Palliative care also consulted for goals of care  Assessment & Plan:  Principal Problem:   Symptomatic anemia Active Problems:   Lymphoma, low grade (HCC)   CLL (chronic lymphocytic leukemia) (HCC)   Essential hypertension   Hyperlipidemia   Chronic ITP (idiopathic thrombocytopenia) (HCC)   Hyperkalemia   Insulin  dependent type 2 diabetes mellitus (HCC)   Cancer associated pain   Pressure injury of skin   Diarrhea   Protein-calorie malnutrition, severe  Symptomatic anemia: Has history of chronic pancytopenia secondary to CLL.  Hemoglobin was in the range of 5 on presentation. Continue to monitor H&H.  No evidence of acute blood loss.   Total of 4 units so far.  Hemoglobin is stable in the range of 9 today  CLL/pancytopenia: History of CLL, was on different therapies.  Follows with Dr. Onesimo .oncology suspecting transformation of CLL to aggressive lymphoma.  Suspicion of bone marrow involvement.  Has chronic thrombocytopenia, suspected to be from chronic ITP.  He was on chemotherapy.Oncology consulted and following.  CT imaging also showed worsening of lytic mets,  worsening of right iliac bone lytic lesions and 6 X 4.3 cm soft tissue mass that erodes into right sacral ala  Left iliopsoas abscess: As seen on CT.  Discussed with general surgery.  General surgery says it is out of the scope of their practice.  IR consulted.  I had mentioned that the collection is not amenable for drainage.  Recommended treatment with antibiotics.  Currently on vancomycin , cefepime , Flagyl .  Follow-up blood cultures.  Will repeat CT imaging in few days.  Urinary retention/AKI/NAGMA: Found to have urinary distention with mild hydronephrosis on CT scan.  Foley placed with removal of 1 L of urine.  Continue gentle IV fluids for today    Hypertension: Blood pressure soft.  Continue IV fluid for today.  Metoprolol  stopped   Insulin -dependent diabetes type 2: Takes insulin  at home.  Continue sliding scale only.  Hemoglobin A1c was 6.5  Diarrhea: Ordered C. difficile and GI pathogen panel.  No fever or leukocytosis..  Diarrhea has stopped.  Abdomen x-ray done on 9/24 did not show any abnormalities.  Failure to thrive/generalized weakness: PT consulted.  Nutritionist consulted.  Patient is cachectic and severely malnourished.  Has poor appetite.TOC consulted  Left lower extremity edema: Negative venous Doppler  Sacral pressure ulcer: Initially developed [facility] at [facility].  Wound care nurse saw him here and recommended general surgery consultation for possible debridement . general surgery consulted.  General surgery recommended hydrotherapy and signed off.  PT following for hydrotherapy.  Goals of care:Patient is cachectic, malnourished, very weak and deconditioned.  Do not think he can tolerate any more chemotherapy.  I have requested oncology to follow him up.  He may be  candidate for hospice.Needs goals of care.  Palliative care consulted.       Nutrition Problem: Severe Malnutrition Etiology: chronic illness, cancer and cancer related treatments Wound 10/07/23 2200 Pressure  Injury Coccyx Unstageable - Full thickness tissue loss in which the base of the injury is covered by slough (yellow, tan, gray, green or brown) and/or eschar (tan, brown or black) in the wound bed. (Active)     Wound 11/01/23 1820 Pressure Injury Sacrum Bilateral Unstageable - Full thickness tissue loss in which the base of the injury is covered by slough (yellow, tan, gray, green or brown) and/or eschar (tan, brown or black) in the wound bed. (Active)     Wound 11/03/23 1422 Pressure Injury Sacrum Medial;Left Unstageable - Full thickness tissue loss in which the base of the injury is covered by slough (yellow, tan, gray, green or brown) and/or eschar (tan, brown or black) in the wound bed. (Active)    DVT prophylaxis:SCDs Start: 11/01/23 1736     Code Status: Full Code  Family Communication: Discussed with wife at bedside on 9/26  Patient status: Inpatient  Patient is from : [facility]  Anticipated discharge to: Not sure   Estimated DC date: Not sure  Consultants: Oncology  Procedures:None  Antimicrobials:  Anti-infectives (From admission, onward)    Start     Dose/Rate Route Frequency Ordered Stop   11/06/23 0900  vancomycin  (VANCOREADY) IVPB 750 mg/150 mL        750 mg 150 mL/hr over 60 Minutes Intravenous Every 24 hours 11/05/23 0805     11/05/23 2000  ceFEPIme  (MAXIPIME ) 2 g in sodium chloride  0.9 % 100 mL IVPB        2 g 200 mL/hr over 30 Minutes Intravenous Every 12 hours 11/05/23 0804     11/05/23 0900  vancomycin  (VANCOREADY) IVPB 1250 mg/250 mL        1,250 mg 166.7 mL/hr over 90 Minutes Intravenous  Once 11/05/23 0804 11/05/23 1245   11/05/23 0900  metroNIDAZOLE  (FLAGYL ) IVPB 500 mg        500 mg 100 mL/hr over 60 Minutes Intravenous Every 12 hours 11/05/23 0804     11/05/23 0830  ceFEPIme  (MAXIPIME ) 2 g in sodium chloride  0.9 % 100 mL IVPB        2 g 200 mL/hr over 30 Minutes Intravenous NOW 11/05/23 0804 11/05/23 0852   11/02/23 1000  acyclovir  (ZOVIRAX ) tablet 400  mg        400 mg Oral Daily 11/01/23 1858         Subjective: Patient seen and examined at bedside today.  Hemodynamically stable but very weak and deconditioned.  Denies any abdomen pain or diarrhea today.  Complains of some cramps on the left lower extremity.  Blood pressure was soft so was started on gentle IV fluids yesterday.  PT started hydro therapy  Objective: Vitals:   11/04/23 2048 11/05/23 0551 11/05/23 0738 11/05/23 1205  BP: 134/71 127/62 138/65 128/61  Pulse: (!) 110 (!) 117 (!) 115 (!) 108  Resp: 18 16 18 18   Temp: 100 F (37.8 C) 98.2 F (36.8 C) 98 F (36.7 C) 99.1 F (37.3 C)  TempSrc: Oral Oral Oral Oral  SpO2: 100% 100% 98% 99%  Weight:      Height:        Intake/Output Summary (Last 24 hours) at 11/05/2023 1352 Last data filed at 11/05/2023 0943 Gross per 24 hour  Intake 566 ml  Output 3350 ml  Net -2784 ml  Filed Weights   11/01/23 1526  Weight: 58.5 kg    Examination:  General exam: Chronically ill looking, extremely weak appearing, cachectic HEENT: PERRL Respiratory system:  no wheezes or crackles  Cardiovascular system: S1 & S2 heard, RRR.  Gastrointestinal system: Abdomen is nondistended, soft and nontender. Central nervous system: Alert and oriented Extremities: No edema, no clubbing ,no cyanosis Skin: Sacral pressure ulcer     Data Reviewed: I have personally reviewed following labs and imaging studies  CBC: Recent Labs  Lab 11/01/23 1544 11/02/23 0607 11/03/23 0602 11/04/23 0620 11/04/23 2326 11/05/23 0531  WBC 4.6 7.1 5.2 4.9  --  5.6  HGB 5.2* 7.7* 8.0* 7.1* 10.0* 9.3*  HCT 16.7* 23.6* 25.2* 22.6* 29.7* 28.8*  MCV 93.8 90.4 91.6 91.9  --  89.4  PLT 76* 82* 82* 73*  --  72*   Basic Metabolic Panel: Recent Labs  Lab 11/01/23 1544 11/02/23 0607 11/03/23 0602 11/05/23 0531  NA 134* 135 135 134*  K 5.7* 5.0 4.4 4.3  CL 103 104 103 104  CO2 17* 17* 19* 16*  GLUCOSE 359* 319* 185* 157*  BUN 62* 56* 49* 73*   CREATININE 1.34* 1.11 1.06 1.39*  CALCIUM  8.2* 7.9* 8.0* 8.0*     No results found for this or any previous visit (from the past 240 hours).   Radiology Studies: CT ABDOMEN PELVIS W CONTRAST Result Date: 11/04/2023 CLINICAL DATA:  Abdominal pain, acute, nonlocalized EXAM: CT ABDOMEN AND PELVIS WITH CONTRAST TECHNIQUE: Multidetector CT imaging of the abdomen and pelvis was performed using the standard protocol following bolus administration of intravenous contrast. RADIATION DOSE REDUCTION: This exam was performed according to the departmental dose-optimization program which includes automated exposure control, adjustment of the mA and/or kV according to patient size and/or use of iterative reconstruction technique. CONTRAST:  OMNIPAQUE  IOHEXOL  300 MG/ML  SOLN COMPARISON:  Chest x-ray 10/07/2023, CT abdomen pelvis 10/07/2023 FINDINGS: Lower chest: Small bilateral, left greater than right, pleural effusions. New on the right and grossly stable on the left. Hepatobiliary: No focal liver abnormality. No gallstones, gallbladder wall thickening, or pericholecystic fluid. No biliary dilatation. Pancreas: No focal lesion. Normal pancreatic contour. No surrounding inflammatory changes. No main pancreatic ductal dilatation. Spleen: Spleen is enlarged in caliber measuring up to 14 cm. Similar-appearing small splenic infarct. No focal lesion. Adrenals/Urinary Tract: No adrenal nodule bilaterally. Bilateral kidneys enhance symmetrically. The urinary bladder lumen is dilated with fluid with associated bilateral at least mild hydronephrosis. No nephroureterolithiasis. On delayed imaging, there is no urothelial wall thickening and there are no filling defects in the opacified portions of the bilateral collecting systems or ureters. Minimal excretion of intravenous contrast noted bilaterally. Stomach/Bowel: Stomach is within normal limits. No evidence of bowel wall thickening or dilatation. The appendix is not  definitely identified with no inflammatory changes in the right lower quadrant to suggest acute appendicitis. Vascular/Lymphatic: No abdominal aorta or iliac aneurysm. Mild atherosclerotic plaque of the aorta and its branches. Interval decrease in size of bulky and conglomerative retroperitoneal, pelvic, inguinal lymphadenopathy with as an example a 4.4 cm left periaortic lymph node (from 6 cm) (2:31) as well as a 2.5 cm left cloquet lymph node (from 3.3 cm) (2:64). Reproductive: No mass. Other: Interval development of a poorly visualized distal left iliopsoas abscess formation measuring 2 x 1 x 6 cm (2:74, 7: 42). No intraperitoneal free fluid. No intraperitoneal free gas. No organized fluid collection. Musculoskeletal: Diffuse subcutaneus soft tissue edema. Interval worsening of a right iliac  bone lytic lesion with associated 6 x 4.3 cm soft tissue mass (2:56). Mass erodes into the right sacral ala. Interval worsening of scattered lytic lesions of the vertebral bodies and sacral vertebral bodies. No acute displaced fracture. IMPRESSION: 1. Interval development of a 2 x 1 x 6 cm poorly visualized distal left iliopsoas abscess. 2. Interval development of a small right and grossly stable small left pleural effusions. 3. Urinary bladder lumen is dilated with fluid with associated bilateral at least mild hydronephrosis. This may reflect changes of obstructive uropathy or reflux. 4. Interval decrease in size of bulky and conglomerative retroperitoneal, pelvic, inguinal lymphadenopathy. 5. Interval worsening of axial and appendicular lytic metastases. Interval worsening of a right iliac bone lytic lesion with associated 6 x 4.3 cm soft tissue mass that erodes into the right sacral ala. 6.  Aortic Atherosclerosis (ICD10-I70.0). Electronically Signed   By: Morgane  Naveau M.D.   On: 11/04/2023 22:14   VAS US  LOWER EXTREMITY VENOUS (DVT) Result Date: 11/04/2023  Lower Venous DVT Study Patient Name:  Tyler Scott   Date of Exam:   11/04/2023 Medical Rec #: [id number]            Accession #:    [id number] Date of Birth: [birth year]           Patient Gender: M Patient Age:   53 years Exam Location:  [facility] Procedure:      VAS US  LOWER EXTREMITY VENOUS (DVT) Referring Phys: Deamonte Sayegh --------------------------------------------------------------------------------  Indications: Edema (LLE).  Comparison Study: No previous exams Performing Technologist: Jody Hill RVT, RDMS  Examination Guidelines: A complete evaluation includes B-mode imaging, spectral Doppler, color Doppler, and power Doppler as needed of all accessible portions of each vessel. Bilateral testing is considered an integral part of a complete examination. Limited examinations for reoccurring indications may be performed as noted. The reflux portion of the exam is performed with the patient in reverse Trendelenburg.  +---------+---------------+---------+-----------+----------+--------------+ RIGHT    CompressibilityPhasicitySpontaneityPropertiesThrombus Aging +---------+---------------+---------+-----------+----------+--------------+ CFV      Full           Yes      Yes                                 +---------+---------------+---------+-----------+----------+--------------+ SFJ      Full                                                        +---------+---------------+---------+-----------+----------+--------------+ FV Prox  Full           Yes      Yes                                 +---------+---------------+---------+-----------+----------+--------------+ FV Mid   Full           Yes      Yes                                 +---------+---------------+---------+-----------+----------+--------------+ FV DistalFull           Yes      Yes                                 +---------+---------------+---------+-----------+----------+--------------+  PFV      Full                                                         +---------+---------------+---------+-----------+----------+--------------+ POP      Full           Yes      Yes                                 +---------+---------------+---------+-----------+----------+--------------+ PTV      Full                                                        +---------+---------------+---------+-----------+----------+--------------+ PERO     Full                                                        +---------+---------------+---------+-----------+----------+--------------+   +---------+---------------+---------+-----------+----------+--------------+ LEFT     CompressibilityPhasicitySpontaneityPropertiesThrombus Aging +---------+---------------+---------+-----------+----------+--------------+ CFV      Full           Yes      Yes                                 +---------+---------------+---------+-----------+----------+--------------+ SFJ      Full                                                        +---------+---------------+---------+-----------+----------+--------------+ FV Prox  Full           Yes      Yes                                 +---------+---------------+---------+-----------+----------+--------------+ FV Mid   Full           Yes      Yes                                 +---------+---------------+---------+-----------+----------+--------------+ FV DistalFull           Yes      Yes                                 +---------+---------------+---------+-----------+----------+--------------+ PFV      Full                                                        +---------+---------------+---------+-----------+----------+--------------+  POP      Full           Yes      Yes                                 +---------+---------------+---------+-----------+----------+--------------+ PTV      Full                                                         +---------+---------------+---------+-----------+----------+--------------+ PERO     Full                                                        +---------+---------------+---------+-----------+----------+--------------+     Summary: BILATERAL: - No evidence of deep vein thrombosis seen in the lower extremities, bilaterally. - RIGHT: - No cystic structure found in the popliteal fossa.  LEFT: - Ultrasound characteristics of a ruptured Baker's Cyst are noted.  *See table(s) above for measurements and observations. Electronically signed by Gaile New MD on 11/04/2023 at 8:26:24 PM.    Final     Scheduled Meds:  (feeding supplement) PROSource Plus  30 mL Oral TID BM   sodium chloride    Intravenous Once   acyclovir   400 mg Oral Daily   allopurinol   300 mg Oral Daily   Chlorhexidine  Gluconate Cloth  6 each Topical Daily   collagenase    Topical Daily   doxepin   25 mg Oral QHS   gabapentin   100 mg Oral TID   HYDROmorphone   2 mg Oral QHS   insulin  aspart  0-5 Units Subcutaneous QHS   insulin  aspart  0-9 Units Subcutaneous TID WC   nutrition supplement (JUVEN)  1 packet Oral BID WC   pantoprazole   40 mg Oral QHS   sertraline   25 mg Oral Daily   simvastatin   20 mg Oral QHS   Continuous Infusions:  ceFEPime  (MAXIPIME ) IV     metronidazole  500 mg (11/05/23 1009)   sodium bicarbonate  150 mEq in sterile water  1,150 mL infusion 75 mL/hr at 11/05/23 1247   [START ON 11/06/2023] vancomycin        LOS: 3 days   Ivonne Mustache, MD [facility] P9/26/2025, 1:52 PM

## 2023-11-05 NOTE — Progress Notes (Signed)
 Physical Therapy Discharge Patient Details Name: Tyler Scott MRN: [id number] DOB: [birth year] Today's Date: 11/05/2023 Time:  -     Patient discharged from PT services secondary to medical decline - will need to re-order PT to resume therapy services.  Please see latest therapy progress note for current level of functioning and progress toward goals.    Progress and discharge plan discussed with patient and/or caregiver: Patient/Caregiver agrees with plan, wife present and declines, States he will be under   Hospice.  GP   [name] PT Acute Rehabilitation Services Office [phone]   [name] 11/05/2023, 11:08 AM

## 2023-11-05 NOTE — Plan of Care (Signed)
  Problem: Safety: Goal: Ability to remain free from injury will improve Outcome: Progressing   Problem: Pain Managment: Goal: General experience of comfort will improve and/or be controlled Outcome: Progressing   Problem: Elimination: Goal: Will not experience complications related to urinary retention Outcome: Progressing   Problem: Coping: Goal: Level of anxiety will decrease Outcome: Progressing

## 2023-11-05 NOTE — Progress Notes (Signed)
 Nutrition Follow-up  DOCUMENTATION CODES:   Severe malnutrition in context of chronic illness  INTERVENTION:   -Prosource Plus PO TID, each provides 100 kcals and 15g protein    -1 packet Juven BID, each packet provides 95 calories, 2.5 grams of protein (collagen), and 9.8 grams of carbohydrate (3 grams sugar); also contains 7 grams of L-arginine and L-glutamine, 300 mg vitamin C, 15 mg vitamin E, 1.2 mcg vitamin B-12, 9.5 mg zinc, 200 mg calcium , and 1.5 g  Calcium  Beta-hydroxy-Beta-methylbutyrate to support wound healing    -Liberalized diet to regular given severe malnutrition and diet limiting pt's choices. A1c 6.5.  NUTRITION DIAGNOSIS:   Severe Malnutrition related to chronic illness, cancer and cancer related treatments as evidenced by severe fat depletion, severe muscle depletion, energy intake < or equal to 50% for > or equal to 1 month, percent weight loss.  Ongoing  GOAL:   Patient will meet greater than or equal to 90% of their needs  Progressing.  MONITOR:   PO intake, Supplement acceptance  REASON FOR ASSESSMENT:   Consult Assessment of nutrition requirement/status  ASSESSMENT:   68 y.o. male with medical history significant of anemia, osteoarthritis, BPH, CLL, low-grade lymphoma, chronic ITP, pancytopenia, tumor lysis syndrome, diabetic retinopathy, insulin  requiring type 2 diabetes, essential hypertension, hyperlipidemia, pressure injury of skin, vitamin K deficiency who was sent to the emergency department from [facility] health care clinic due to generalized weakness and abnormal labs. He had 3 episodes of diarrhea in the last 2 days associated with mild abdominal discomfort.  He has been using MiraLAX   Patient continues to accept Prosource and Juven supplements. Not eating much of meals.  Now noted that palliative care has been consulted and family considering residential hospice. Will monitor for GOC. Leave current interventions.  Admission weight: 129  lbs No other weights this admission.  Medications reviewed.  Labs reviewed: CBGs: 142-154 Low Na   Diet Order:   Diet Order             Diet regular Room service appropriate? Yes; Fluid consistency: Thin  Diet effective now                   EDUCATION NEEDS:   Education needs have been addressed  Skin:  Skin Assessment: Skin Integrity Issues: Skin Integrity Issues:: Unstageable Unstageable: coccyx, bilateral sacrum  Last BM:  9/26 -type 6  Height:   Ht Readings from Last 1 Encounters:  11/01/23 5' 8 (1.727 m)    Weight:   Wt Readings from Last 1 Encounters:  11/01/23 58.5 kg     BMI:  Body mass index is 19.61 kg/m.  Estimated Nutritional Needs:   Kcal:  1800-2000  Protein:  90-110g  Fluid:  2L/day  Morna Lee, MS, RD, LDN Inpatient Clinical Dietitian Contact via Secure chat

## 2023-11-05 NOTE — Progress Notes (Addendum)
 Patient ID: Tyler Scott, male   DOB: 1955/11/14, 68 y.o.   MRN: [id number] Request received for consideration of image guided drainage of left iliopsoas abscess in pt. Latest CT scan was reviewed by Dr. Jenna.  Per his review, area in question is too small for drain and would be difficult to even aspirate based on size and location. Please contact Dr. Jenna at [phone] or pager [phone] with any additional questions.

## 2023-11-05 NOTE — Progress Notes (Signed)
 MAYANK TEUSCHER   DOB:1955-06-30   FM#:[id number]      ASSESSMENT & PLAN:  Bernard Slayden. Patry is a 68 year old male patient with oncologic history significant for CLL. Patient has had multiple hospital admissions. He is now admitted with severe anemia and failure to thrive. Follows with Medical Oncology/Dr. Onesimo.   Severe Anemia, symptomatic -- Likely multifactorial secondary to oral chemo, malignancy, poor nutrition -- Hemoglobin 5.2 on admission 9/22.  Status post PRBC transfusions this admission.  -- HGB dropped to 7.1 and was transfused 9/25.  HGB with good response to 10.0, now 9.3 at this time. -- Recommend PRBC transfusion for HGB <7.0 -- Continue to monitor CBC with differential   Thrombocytopenia -- Platelets stable 72K -- Has been on weekly Nplate  4 mcg/kg.  Maintain platelet count >50K. -- Continue to monitor CBC with differential   CLL/SLL/Low grade Lymphoma -Diagnosed 2012 - Status post multiple lines of therapy including Bendamustine , rituximab , ibrutinib , Calquence .  Received Bendamustine  without rituximab  09/08/2023. - Previously with many interrupted cycles of therapy due to no-shows. - Most recently on venetoclax  + Rituxan . - Was admitted to SNF. - LDH and uric acid, no evidence of tumor lysis -- CT imaging done 9/25 due to abdominal bulge noted. Shows interval worsening of lytic mets and worsening of right iliac bone lytic lesion with 6x4.3 cm mass eroding into right sacral ala.  -- Ordered Palliative eval for goc discussion.  Initiated goc with patient's wife who wants to continue discussing with patient. She said she would be open to residential hospice as she cannot care for him at home as she is herself having surgery next week. - Medical oncology/Dr. Onesimo following closely  Urinary retention/uropathy -- Mid abdominal hard bulge noted on 9/25 -- CT imaging showed bladder was dilated. Foley was placed and bulge has resolved. -- Continue to monitor    Failure to thrive Weight loss/poor appetite - Patient is cachectic and frail appearing. - appreciate Dietician eval. - Continue supportive care   Lower extremity weakness with edema Impaired mobility - Edema 1+ of both feet - Patient is not ambulating at this time - Recommend PT support - Continue supportive care   Sacral pressure ulcer  - Likely developed at SNF - Continue supportive care   Diabetes - Monitor blood sugar levels - Continue insulin  coverage per protocol      Code Status Full   Subjective:  Patient seen awake and alert laying in bed. Wife at bedside.  Patient appears very weak and cachectic.  Initiated goc discussion with wife who is willing to discuss more with patient.  Remains with very poor appetite, barely eating per wife.    Objective:   Intake/Output Summary (Last 24 hours) at 11/05/2023 1113 Last data filed at 11/05/2023 0943 Gross per 24 hour  Intake 900 ml  Output 3350 ml  Net -2450 ml     PHYSICAL EXAMINATION: ECOG PERFORMANCE STATUS: 4 - Bedbound  Vitals:   11/05/23 0551 11/05/23 0738  BP: 127/62 138/65  Pulse: (!) 117 (!) 115  Resp: 16 18  Temp: 98.2 F (36.8 C) 98 F (36.7 C)  SpO2: 100% 98%   Filed Weights   11/01/23 1526  Weight: 129 lb (58.5 kg)    GENERAL: alert, +chronically ill-appearing +frail +cachectic SKIN: +pale skin color, texture, turgor are normal, +sacral ulcer EYES: normal, conjunctiva are pink and non-injected, sclera clear OROPHARYNX: no exudate, no erythema and lips, buccal mucosa, and tongue normal  NECK: supple, thyroid  normal  size, non-tender, without nodularity LYMPH: no palpable lymphadenopathy in the cervical, axillary or inguinal LUNGS: clear to auscultation and percussion with normal breathing effort HEART: regular rate & rhythm and no murmurs and no lower extremity edema ABDOMEN: abdomen soft, non-tender and normal bowel sounds MUSCULOSKELETAL: no cyanosis of digits and no clubbing  PSYCH:  alert & oriented x 3 with fluent speech NEURO: no focal motor/sensory deficits   All questions were answered. The patient knows to call the clinic with any problems, questions or concerns.   The total time spent in the appointment was 40 minutes encounter with patient including review of chart and various tests results, discussions about plan of care and coordination of care plan  [name], NP 11/05/2023 11:14 AM    Labs Reviewed:  Lab Results  Component Value Date   WBC 5.6 11/05/2023   HGB 9.3 (L) 11/05/2023   HCT 28.8 (L) 11/05/2023   MCV 89.4 11/05/2023   PLT 72 (L) 11/05/2023   Recent Labs    10/07/23 1539 10/07/23 2151 10/17/23 0629 10/19/23 0539 11/01/23 1544 11/02/23 0607 11/03/23 0602 11/05/23 0531  NA SPECIMEN DILUTED   < > 137   < > 134* 135 135 134*  K SPECIMEN DILUTED   < > 4.2   < > 5.7* 5.0 4.4 4.3  CL SPECIMEN DILUTED   < > 104   < > 103 104 103 104  CO2 SPECIMEN DILUTED   < > 22   < > 17* 17* 19* 16*  GLUCOSE SPECIMEN DILUTED   < > 96   < > 359* 319* 185* 157*  BUN SPECIMEN DILUTED   < > 10   < > 62* 56* 49* 73*  CREATININE SPECIMEN DILUTED   < > 0.73   < > 1.34* 1.11 1.06 1.39*  CALCIUM  SPECIMEN DILUTED   < > 8.0*   < > 8.2* 7.9* 8.0* 8.0*  GFRNONAA SPECIMEN DILUTED   < > >60   < > 58* >60 >60 56*  GFRAA SPECIMEN DILUTED  --   --   --   --   --   --   --   PROT SPECIMEN DILUTED   < > 4.6*  --  5.3* 4.7*  --   --   ALBUMIN SPECIMEN DILUTED   < > 3.1*  --  3.3* 3.0*  --   --   AST SPECIMEN DILUTED   < > 28  --  23 18  --   --   ALT SPECIMEN DILUTED   < > 12  --  18 14  --   --   ALKPHOS SPECIMEN DILUTED   < > 87  --  77 69  --   --   BILITOT SPECIMEN DILUTED   < > 1.1  --  0.9 1.1  --   --    < > = values in this interval not displayed.    Studies Reviewed:  CT ABDOMEN PELVIS W CONTRAST Result Date: 11/04/2023 CLINICAL DATA:  Abdominal pain, acute, nonlocalized EXAM: CT ABDOMEN AND PELVIS WITH CONTRAST TECHNIQUE: Multidetector CT imaging of the  abdomen and pelvis was performed using the standard protocol following bolus administration of intravenous contrast. RADIATION DOSE REDUCTION: This exam was performed according to the departmental dose-optimization program which includes automated exposure control, adjustment of the mA and/or kV according to patient size and/or use of iterative reconstruction technique. CONTRAST:  OMNIPAQUE  IOHEXOL  300 MG/ML  SOLN COMPARISON:  Chest x-ray 10/07/2023, CT  abdomen pelvis 10/07/2023 FINDINGS: Lower chest: Small bilateral, left greater than right, pleural effusions. New on the right and grossly stable on the left. Hepatobiliary: No focal liver abnormality. No gallstones, gallbladder wall thickening, or pericholecystic fluid. No biliary dilatation. Pancreas: No focal lesion. Normal pancreatic contour. No surrounding inflammatory changes. No main pancreatic ductal dilatation. Spleen: Spleen is enlarged in caliber measuring up to 14 cm. Similar-appearing small splenic infarct. No focal lesion. Adrenals/Urinary Tract: No adrenal nodule bilaterally. Bilateral kidneys enhance symmetrically. The urinary bladder lumen is dilated with fluid with associated bilateral at least mild hydronephrosis. No nephroureterolithiasis. On delayed imaging, there is no urothelial wall thickening and there are no filling defects in the opacified portions of the bilateral collecting systems or ureters. Minimal excretion of intravenous contrast noted bilaterally. Stomach/Bowel: Stomach is within normal limits. No evidence of bowel wall thickening or dilatation. The appendix is not definitely identified with no inflammatory changes in the right lower quadrant to suggest acute appendicitis. Vascular/Lymphatic: No abdominal aorta or iliac aneurysm. Mild atherosclerotic plaque of the aorta and its branches. Interval decrease in size of bulky and conglomerative retroperitoneal, pelvic, inguinal lymphadenopathy with as an example a 4.4 cm left  periaortic lymph node (from 6 cm) (2:31) as well as a 2.5 cm left cloquet lymph node (from 3.3 cm) (2:64). Reproductive: No mass. Other: Interval development of a poorly visualized distal left iliopsoas abscess formation measuring 2 x 1 x 6 cm (2:74, 7: 42). No intraperitoneal free fluid. No intraperitoneal free gas. No organized fluid collection. Musculoskeletal: Diffuse subcutaneus soft tissue edema. Interval worsening of a right iliac bone lytic lesion with associated 6 x 4.3 cm soft tissue mass (2:56). Mass erodes into the right sacral ala. Interval worsening of scattered lytic lesions of the vertebral bodies and sacral vertebral bodies. No acute displaced fracture. IMPRESSION: 1. Interval development of a 2 x 1 x 6 cm poorly visualized distal left iliopsoas abscess. 2. Interval development of a small right and grossly stable small left pleural effusions. 3. Urinary bladder lumen is dilated with fluid with associated bilateral at least mild hydronephrosis. This may reflect changes of obstructive uropathy or reflux. 4. Interval decrease in size of bulky and conglomerative retroperitoneal, pelvic, inguinal lymphadenopathy. 5. Interval worsening of axial and appendicular lytic metastases. Interval worsening of a right iliac bone lytic lesion with associated 6 x 4.3 cm soft tissue mass that erodes into the right sacral ala. 6.  Aortic Atherosclerosis (ICD10-I70.0). Electronically Signed   By: Morgane  Naveau M.D.   On: 11/04/2023 22:14   VAS US  LOWER EXTREMITY VENOUS (DVT) Result Date: 11/04/2023  Lower Venous DVT Study Patient Name:  LIBERATO STANSBERY  Date of Exam:   11/04/2023 Medical Rec #: [id number]            Accession #:    [id number] Date of Birth: 12-18-1955           Patient Gender: M Patient Age:   50 years Exam Location:  [facility] Procedure:      VAS US  LOWER EXTREMITY VENOUS (DVT) Referring Phys: AMRIT ADHIKARI  --------------------------------------------------------------------------------  Indications: Edema (LLE).  Comparison Study: No previous exams Performing Technologist: Jody Hill RVT, RDMS  Examination Guidelines: A complete evaluation includes B-mode imaging, spectral Doppler, color Doppler, and power Doppler as needed of all accessible portions of each vessel. Bilateral testing is considered an integral part of a complete examination. Limited examinations for reoccurring indications may be performed as noted. The reflux portion of the exam is  performed with the patient in reverse Trendelenburg.  +---------+---------------+---------+-----------+----------+--------------+ RIGHT    CompressibilityPhasicitySpontaneityPropertiesThrombus Aging +---------+---------------+---------+-----------+----------+--------------+ CFV      Full           Yes      Yes                                 +---------+---------------+---------+-----------+----------+--------------+ SFJ      Full                                                        +---------+---------------+---------+-----------+----------+--------------+ FV Prox  Full           Yes      Yes                                 +---------+---------------+---------+-----------+----------+--------------+ FV Mid   Full           Yes      Yes                                 +---------+---------------+---------+-----------+----------+--------------+ FV DistalFull           Yes      Yes                                 +---------+---------------+---------+-----------+----------+--------------+ PFV      Full                                                        +---------+---------------+---------+-----------+----------+--------------+ POP      Full           Yes      Yes                                 +---------+---------------+---------+-----------+----------+--------------+ PTV      Full                                                         +---------+---------------+---------+-----------+----------+--------------+ PERO     Full                                                        +---------+---------------+---------+-----------+----------+--------------+   +---------+---------------+---------+-----------+----------+--------------+ LEFT     CompressibilityPhasicitySpontaneityPropertiesThrombus Aging +---------+---------------+---------+-----------+----------+--------------+ CFV      Full           Yes      Yes                                 +---------+---------------+---------+-----------+----------+--------------+  SFJ      Full                                                        +---------+---------------+---------+-----------+----------+--------------+ FV Prox  Full           Yes      Yes                                 +---------+---------------+---------+-----------+----------+--------------+ FV Mid   Full           Yes      Yes                                 +---------+---------------+---------+-----------+----------+--------------+ FV DistalFull           Yes      Yes                                 +---------+---------------+---------+-----------+----------+--------------+ PFV      Full                                                        +---------+---------------+---------+-----------+----------+--------------+ POP      Full           Yes      Yes                                 +---------+---------------+---------+-----------+----------+--------------+ PTV      Full                                                        +---------+---------------+---------+-----------+----------+--------------+ PERO     Full                                                        +---------+---------------+---------+-----------+----------+--------------+     Summary: BILATERAL: - No evidence of deep vein thrombosis seen in the lower extremities,  bilaterally. - RIGHT: - No cystic structure found in the popliteal fossa.  LEFT: - Ultrasound characteristics of a ruptured Baker's Cyst are noted.  *See table(s) above for measurements and observations. Electronically signed by Gaile New MD on 11/04/2023 at 8:26:24 PM.    Final    DG Abd 1 View Result Date: 11/03/2023 CLINICAL DATA:  Abdominal distension.  History of CLL. EXAM: ABDOMEN - 1 VIEW COMPARISON:  08/27/2023 FINDINGS: Bowel gas pattern is nonobstructive with a few air-filled nondilated small bowel loops over the right abdomen. No free peritoneal air. Mild degenerative change of the spine and hips. IMPRESSION: Nonobstructive bowel gas pattern. Electronically Signed  By: Toribio Agreste M.D.   On: 11/03/2023 12:05   CT HEAD WO CONTRAST ( ) Result Date: 10/07/2023 CLINICAL DATA:  Recent fall with headaches, initial encounter EXAM: CT HEAD WITHOUT CONTRAST TECHNIQUE: Contiguous axial images were obtained from the base of the skull through the vertex without intravenous contrast. RADIATION DOSE REDUCTION: This exam was performed according to the departmental dose-optimization program which includes automated exposure control, adjustment of the mA and/or kV according to patient size and/or use of iterative reconstruction technique. COMPARISON:  None Available. FINDINGS: Brain: No evidence of acute infarction, hemorrhage, hydrocephalus, extra-axial collection or mass lesion/mass effect. Mild atrophic changes are noted. Vascular: No hyperdense vessel or unexpected calcification. Skull: Normal. Negative for fracture or focal lesion. Sinuses/Orbits: No acute finding. Other: None. IMPRESSION: No acute intracranial abnormality noted. Electronically Signed   By: Oneil Devonshire M.D.   On: 10/07/2023 21:12   CT ABDOMEN PELVIS W CONTRAST Result Date: 10/07/2023 CLINICAL DATA:  Acute nonlocalized abdominal pain. History of CLL. Hypotensive [facility] with tachycardia and decreased oxygenation. EXAM: CT  ABDOMEN AND PELVIS WITH CONTRAST TECHNIQUE: Multidetector CT imaging of the abdomen and pelvis was performed using the standard protocol following bolus administration of intravenous contrast. RADIATION DOSE REDUCTION: This exam was performed according to the departmental dose-optimization program which includes automated exposure control, adjustment of the mA and/or kV according to patient size and/or use of iterative reconstruction technique. CONTRAST:  OMNIPAQUE  IOHEXOL  300 MG/ML  SOLN COMPARISON:  08/06/2023 FINDINGS: Lower chest: Large left and small right pleural effusions. Basilar atelectasis or consolidation also greater on the left. Cardiac enlargement. Hepatobiliary: No focal liver abnormality is seen. No gallstones, gallbladder wall thickening, or biliary dilatation. Pancreas: Unremarkable. No pancreatic ductal dilatation or surrounding inflammatory changes. Spleen: Spleen is enlarged. Focal wedge-shaped defect in the posterior periphery likely representing splenic infarct. Adrenals/Urinary Tract: No adrenal gland nodules. Kidneys are symmetrical. No hydronephrosis or hydroureter. Bladder is normal. Stomach/Bowel: Stomach, small bowel, and colon are not abnormally distended. No wall thickening or inflammatory infiltration. Appendix is normal. Vascular/Lymphatic: Calcification of the aorta. No aneurysm. There is extensive bulky lymphadenopathy involving the retrocrural space, celiac axis, splenic hilar region, throughout the retroperitoneum, throughout the mesentery, bilateral anterior and posterior pelvic chains, in the sciatic region, and in the groin regions. Largest retroperitoneal lymph node mass measures about 8.9 x 8.5 cm on transverse dimension. Measured at a similar level, there is no significant change since the prior study. Reproductive: Prostate is unremarkable. Other: Small amount of free fluid along the pericolic gutters. No free air. Musculoskeletal: Degenerative changes in the spine.  Lucent lesions demonstrated in the sacrum and pelvis, most prominent on the right. Focal lesions measure up to about 1.7 cm diameter. These lesions are progressive since prior study. IMPRESSION: 1. Extensive bulky lymphadenopathy throughout the lower chest, abdomen, pelvis, and groin regions. Lymphadenopathy is similar to the previous study. 2. Splenic enlargement with peripheral splenic infarct. This is likely due to lymphomatous involvement. 3. Small lucent lesions in the pelvis may represent lymphomatous involvement. Bone lesions appear progressive since prior study. 4. Moderate left and small right pleural effusions with basilar atelectasis, progressing since prior study. Cardiac enlargement. Electronically Signed   By: Elsie Gravely M.D.   On: 10/07/2023 17:51   DG Chest Port 1 View Result Date: 10/07/2023 CLINICAL DATA:  Questionable sepsis. EXAM: PORTABLE CHEST 1 VIEW COMPARISON:  Chest CT dated 08/07/2023. FINDINGS: Small left pleural effusion and left lung base atelectasis or infiltrate. No pneumothorax. The cardiac  silhouette is within limits. Right paratracheal density consistent with adenopathy. No acute osseous pathology. IMPRESSION: 1. Small left pleural effusion and left lung base atelectasis or infiltrate. 2. Right paratracheal adenopathy. Electronically Signed   By: Vanetta Chou M.D.   On: 10/07/2023 15:59

## 2023-11-05 NOTE — Progress Notes (Signed)
   11/05/23 0551  Assess: MEWS Score  Temp 98.2 F (36.8 C)  BP 127/62  MAP (mmHg) 80  Pulse Rate (!) 117  Resp 16  SpO2 100 %  O2 Device Room Air  Assess: MEWS Score  MEWS Temp 0  MEWS Systolic 0  MEWS Pulse 2  MEWS RR 0  MEWS LOC 0  MEWS Score 2  MEWS Score Color Yellow  Assess: if the MEWS score is Yellow or Red  Were vital signs accurate and taken at a resting state? Yes  Does the patient meet 2 or more of the SIRS criteria? No  Does the patient have a confirmed or suspected source of infection? No  MEWS guidelines implemented  Yes, yellow  Treat  MEWS Interventions Considered administering scheduled or prn medications/treatments as ordered  Take Vital Signs  Increase Vital Sign Frequency  Yellow: Q2hr x1, continue Q4hrs until patient remains green for 12hrs  Escalate  MEWS: Escalate Yellow: Discuss with charge nurse and consider notifying provider and/or RRT  Notify: Charge Nurse/RN  Name of Charge Nurse/RN Notified Leory HERO, RN  Provider Notification  Provider Name/Title Lavanda Horns, NP  Date Provider Notified 11/05/23  Time Provider Notified [phone]  Method of Notification Page  Notification Reason Other (Comment) (tachy HR 17)  Provider response No new orders  Date of Provider Response 11/05/23  Time of Provider Response 0604  Notify: Rapid Response  Name of Rapid Response RN Notified N/A  Date Rapid Response Notified  (N/A)  Time Rapid Response Notified  (N/A)  Assess: SIRS CRITERIA  SIRS Temperature  0  SIRS Respirations  0  SIRS Pulse 1  SIRS WBC 0  SIRS Score Sum  1   Pt laying comfortably in bed, no distress noted.

## 2023-11-06 DIAGNOSIS — L0291 Cutaneous abscess, unspecified: Secondary | ICD-10-CM | POA: Diagnosis not present

## 2023-11-06 DIAGNOSIS — Z7189 Other specified counseling: Secondary | ICD-10-CM

## 2023-11-06 DIAGNOSIS — G893 Neoplasm related pain (acute) (chronic): Secondary | ICD-10-CM

## 2023-11-06 DIAGNOSIS — E43 Unspecified severe protein-calorie malnutrition: Secondary | ICD-10-CM | POA: Diagnosis not present

## 2023-11-06 DIAGNOSIS — L89150 Pressure ulcer of sacral region, unstageable: Secondary | ICD-10-CM

## 2023-11-06 DIAGNOSIS — Z66 Do not resuscitate: Secondary | ICD-10-CM

## 2023-11-06 DIAGNOSIS — C9110 Chronic lymphocytic leukemia of B-cell type not having achieved remission: Secondary | ICD-10-CM | POA: Diagnosis not present

## 2023-11-06 DIAGNOSIS — Z79899 Other long term (current) drug therapy: Secondary | ICD-10-CM

## 2023-11-06 DIAGNOSIS — Z515 Encounter for palliative care: Secondary | ICD-10-CM

## 2023-11-06 DIAGNOSIS — D649 Anemia, unspecified: Secondary | ICD-10-CM | POA: Diagnosis not present

## 2023-11-06 LAB — BASIC METABOLIC PANEL WITH GFR
Anion gap: 15 (ref 5–15)
BUN: 62 mg/dL — ABNORMAL HIGH (ref 8–23)
CO2: 18 mmol/L — ABNORMAL LOW (ref 22–32)
Calcium: 7.8 mg/dL — ABNORMAL LOW (ref 8.9–10.3)
Chloride: 103 mmol/L (ref 98–111)
Creatinine, Ser: 1.45 mg/dL — ABNORMAL HIGH (ref 0.61–1.24)
GFR, Estimated: 53 mL/min — ABNORMAL LOW (ref >60)
Glucose, Bld: 157 mg/dL — ABNORMAL HIGH (ref 70–99)
Potassium: 3.5 mmol/L (ref 3.5–5.1)
Sodium: 136 mmol/L (ref 135–145)

## 2023-11-06 LAB — CBC
HCT: 23.6 % — ABNORMAL LOW (ref 39.0–52.0)
Hemoglobin: 7.5 g/dL — ABNORMAL LOW (ref 13.0–17.0)
MCH: 29.3 pg (ref 26.0–34.0)
MCHC: 31.8 g/dL (ref 30.0–36.0)
MCV: 92.2 fL (ref 80.0–100.0)
Platelets: 60 K/uL — ABNORMAL LOW (ref 150–400)
RBC: 2.56 MIL/uL — ABNORMAL LOW (ref 4.22–5.81)
RDW: 16.5 % — ABNORMAL HIGH (ref 11.5–15.5)
WBC: 3.6 K/uL — ABNORMAL LOW (ref 4.0–10.5)
nRBC: 0 % (ref 0.0–0.2)

## 2023-11-06 LAB — GLUCOSE, CAPILLARY
Glucose-Capillary: 145 mg/dL — ABNORMAL HIGH (ref 70–99)
Glucose-Capillary: 154 mg/dL — ABNORMAL HIGH (ref 70–99)

## 2023-11-06 MED ORDER — POLYVINYL ALCOHOL 1.4 % OP SOLN
1.0000 [drp] | Freq: Four times a day (QID) | OPHTHALMIC | Status: DC | PRN
  Administered 2023-11-08: 1 [drp] via OPHTHALMIC
  Filled 2023-11-06: qty 15

## 2023-11-06 MED ORDER — GLYCOPYRROLATE 1 MG PO TABS: 1.0000 mg | ORAL_TABLET | ORAL | Status: DC | PRN

## 2023-11-06 MED ORDER — BIOTENE DRY MOUTH MT LIQD: 15.0000 mL | OROMUCOSAL | Status: DC | PRN

## 2023-11-06 MED ORDER — GLYCOPYRROLATE 0.2 MG/ML IJ SOLN: 0.2000 mg | INTRAMUSCULAR | Status: DC | PRN

## 2023-11-06 MED ORDER — HALOPERIDOL LACTATE 2 MG/ML PO CONC: 0.5000 mg | ORAL | Status: DC | PRN

## 2023-11-06 MED ORDER — HALOPERIDOL 0.5 MG PO TABS: 0.5000 mg | ORAL_TABLET | ORAL | Status: DC | PRN

## 2023-11-06 MED ORDER — HYDROMORPHONE HCL 1 MG/ML IJ SOLN: 0.5000 mg | INTRAMUSCULAR | Status: DC | PRN

## 2023-11-06 MED ORDER — HYDROMORPHONE HCL 1 MG/ML IJ SOLN
0.5000 mg | INTRAMUSCULAR | Status: DC | PRN
  Administered 2023-11-06 – 2023-11-08 (×10): 1 mg via INTRAVENOUS
  Filled 2023-11-06 (×10): qty 1

## 2023-11-06 MED ORDER — HALOPERIDOL LACTATE 5 MG/ML IJ SOLN: 0.5000 mg | INTRAMUSCULAR | Status: DC | PRN

## 2023-11-06 NOTE — Plan of Care (Signed)
  Problem: Education: Goal: Knowledge of General Education information will improve Description: Including pain rating scale, medication(s)/side effects and non-pharmacologic comfort measures Outcome: Progressing   Problem: Clinical Measurements: Goal: Ability to maintain clinical measurements within normal limits will improve Outcome: Progressing Goal: Will remain free from infection Outcome: Progressing   Problem: Nutrition: Goal: Adequate nutrition will be maintained Outcome: Progressing   Problem: Coping: Goal: Level of anxiety will decrease Outcome: Progressing   Problem: Pain Managment: Goal: General experience of comfort will improve and/or be controlled Outcome: Progressing   Problem: Safety: Goal: Ability to remain free from injury will improve Outcome: Progressing   Problem: Skin Integrity: Goal: Risk for impaired skin integrity will decrease Outcome: Progressing

## 2023-11-06 NOTE — Plan of Care (Signed)

## 2023-11-06 NOTE — Progress Notes (Signed)
 PROGRESS NOTE  Tyler Scott  FMW:[id number] DOB: 1955/08/12 DOA: 11/01/2023 PCP: [facility]   Brief Narrative: Patient is a 68 year old male with history of insulin -dependent type type II, hypertension, CLL, ITP, pancytopenia, hypertension, hyperlipidemia who presented to the emergency department from [facility]  for evaluation of weakness, abnormal labs.  On presentation ,hemoglobin was 5.2, platelets of 76, creatinine of 1.3.  He was hemodynamically stable on presentation.  2 units  of blood transfusion ordered.  Oncology is following.  Also found to have sacral ulcer, general surgery consulted, PT doing hydrotherapy.  Abdomen CT/pelvis done on 9/25 showed left iliopsoas abscess.  General surgery/IR declined any kind of intervention saying it is not amenable for drainage.  Currently on broad-spectrum antibiotics .  Palliative care also consulted for goals of care  Assessment & Plan:  Symptomatic anemia: Has history of chronic pancytopenia secondary to CLL.  Hemoglobin was in the range of 5 on presentation.  It appears that patient was transfused about 4 units of PRBC. Hemoglobin responded.  Await labs from today.  No overt bleeding noted.  CLL/pancytopenia:  History of CLL, was on different therapies although he had had several no-shows according to oncology notes..  Follows with Dr. Onesimo.  Oncology suspecting transformation of CLL to aggressive lymphoma.  Suspicion of bone marrow involvement.   Has chronic thrombocytopenia, suspected to be from chronic ITP.   CT imaging also showed worsening of lytic mets, worsening of right iliac bone lytic lesions and 6 X 4.3 cm soft tissue mass that erodes into right sacral ala. Oncology is following.  Left iliopsoas abscess:  As seen on CT.  Discussed with general surgery.  General surgery says it is out of the scope of their practice.  IR consulted.  IR mentioned that the collection is not amenable for drainage.  Recommended treatment with  antibiotics.   Patient remains on broad-spectrum antibiotics with vancomycin  cefepime  and metronidazole .  No culture data available as yet.  Blood cultures are pending. Will need a repeat CT scan in a few days to make sure that the abscess is improving.  Urinary retention Found to have urinary distention with mild hydronephrosis on CT scan.  Foley placed with removal of 1 L of urine.   Initiate Flomax.  May consider voiding trial in a few days.  Though he appears to be nonambulatory and may not do well with voiding trial.  Acute kidney injury/metabolic acidosis Was given IV fluids yesterday.  Repeat labs are pending today.    Hypertension:  Soft blood pressures were noted.  Metoprolol  was discontinued.  Blood pressures appear to have rebounded.  Continue to monitor.  May resume metoprolol  in 24 to 48 hours depending on blood pressure and heart rate trends.     Insulin -dependent diabetes type 2: Takes insulin  at home.  Continue sliding scale only.  Hemoglobin A1c was 6.5.  CBGs are reasonably well-controlled.  Diarrhea: Ordered C. difficile and GI pathogen panel.  No fever or leukocytosis..  Diarrhea has stopped.  Abdomen x-ray done on 9/24 did not show any abnormalities.  Failure to thrive/generalized weakness: PT consulted.  Nutritionist consulted.  Patient is cachectic and severely malnourished.  Has poor appetite.TOC consulted  Left lower extremity edema: Negative venous Doppler  Sacral pressure ulcer: Initially developed while at [facility].  Wound care nurse saw him here and recommended general surgery consultation for possible debridement . General surgery consulted.  General surgery recommended hydrotherapy and signed off.  PT following for hydrotherapy.  Goals of  care: Palliative care has been consulted to go over goals of care with patient and family.  Severe protein calorie malnutrition Nutrition Problem: Severe Malnutrition Etiology: chronic illness, cancer and cancer related  treatments  DVT prophylaxis:SCDs Start: 11/01/23 1736 CODE STATUS: Full code Family Communication: No family at bedside today. Disposition: To be determined.  Here from [facility].  Consultants: Oncology.  Palliative care  Procedures:None  Antimicrobials:  Anti-infectives (From admission, onward)    Start     Dose/Rate Route Frequency Ordered Stop   11/06/23 0900  vancomycin  (VANCOREADY) IVPB 750 mg/150 mL        750 mg 150 mL/hr over 60 Minutes Intravenous Every 24 hours 11/05/23 0805     11/05/23 2000  ceFEPIme  (MAXIPIME ) 2 g in sodium chloride  0.9 % 100 mL IVPB        2 g 200 mL/hr over 30 Minutes Intravenous Every 12 hours 11/05/23 0804     11/05/23 0900  vancomycin  (VANCOREADY) IVPB 1250 mg/250 mL        1,250 mg 166.7 mL/hr over 90 Minutes Intravenous  Once 11/05/23 0804 11/05/23 1245   11/05/23 0900  metroNIDAZOLE  (FLAGYL ) IVPB 500 mg        500 mg 100 mL/hr over 60 Minutes Intravenous Every 12 hours 11/05/23 0804     11/05/23 0830  ceFEPIme  (MAXIPIME ) 2 g in sodium chloride  0.9 % 100 mL IVPB        2 g 200 mL/hr over 30 Minutes Intravenous NOW 11/05/23 0804 11/05/23 0852   11/02/23 1000  acyclovir  (ZOVIRAX ) tablet 400 mg        400 mg Oral Daily 11/01/23 1858         Subjective: Patient denies any complaints.  Denies any significant pain.  No chest pain or shortness of breath.   Objective: Vitals:   11/05/23 1205 11/05/23 1632 11/05/23 2022 11/06/23 0036  BP: 128/61 (!) 130/59 (!) 141/62 130/64  Pulse: (!) 108 (!) 103 (!) 103 (!) 105  Resp: 18 18 16 16   Temp: 99.1 F (37.3 C) 98.5 F (36.9 C) 99.6 F (37.6 C) 99.7 F (37.6 C)  TempSrc: Oral Oral Oral Oral  SpO2: 99% 100% 100% 98%  Weight:      Height:        Intake/Output Summary (Last 24 hours) at 11/06/2023 9077 Last data filed at 11/05/2023 2027 Gross per 24 hour  Intake --  Output 2750 ml  Net -2750 ml   Filed Weights   11/01/23 1526  Weight: 58.5 kg    Examination:  General appearance: Awake  alert.  In no distress.   Resp: Clear to auscultation bilaterally.  Normal effort Cardio: S1-S2 is normal regular.  No S3-S4.  No rubs murmurs or bruit GI: Abdomen is soft.  Mildly tender diffusely without any rebound acidity or guarding.  No masses organomegaly Extremities: Edema is noted.  Significant physical deconditioning noted. Neurologic:  No focal neurological deficits.    Data Reviewed:   CBC: Recent Labs  Lab 11/01/23 1544 11/02/23 0607 11/03/23 0602 11/04/23 0620 11/04/23 2326 11/05/23 0531  WBC 4.6 7.1 5.2 4.9  --  5.6  HGB 5.2* 7.7* 8.0* 7.1* 10.0* 9.3*  HCT 16.7* 23.6* 25.2* 22.6* 29.7* 28.8*  MCV 93.8 90.4 91.6 91.9  --  89.4  PLT 76* 82* 82* 73*  --  72*   Basic Metabolic Panel: Recent Labs  Lab 11/01/23 1544 11/02/23 0607 11/03/23 0602 11/05/23 0531  NA 134* 135 135 134*  K 5.7*  5.0 4.4 4.3  CL 103 104 103 104  CO2 17* 17* 19* 16*  GLUCOSE 359* 319* 185* 157*  BUN 62* 56* 49* 73*  CREATININE 1.34* 1.11 1.06 1.39*  CALCIUM  8.2* 7.9* 8.0* 8.0*     Recent Results (from the past 240 hours)  Culture, blood (Routine X 2) w Reflex to ID Panel     Status: None (Preliminary result)   Collection Time: 11/05/23  9:50 AM   Specimen: BLOOD RIGHT ARM  Result Value Ref Range Status   Specimen Description   Final    BLOOD RIGHT ARM Performed at [facility], [address] [place], [place] [address]    Special Requests   Final    BOTTLES DRAWN AEROBIC AND ANAEROBIC Blood Culture results may not be optimal due to an inadequate volume of blood received in culture bottles Performed at [facility], [address] [place], [place] [address]    Culture PENDING  Incomplete   Report Status PENDING  Incomplete  Culture, blood (Routine X 2) w Reflex to ID Panel     Status: None (Preliminary result)   Collection Time: 11/05/23  9:50 AM   Specimen: BLOOD LEFT ARM  Result Value Ref Range Status   Specimen Description   Final    BLOOD LEFT  ARM Performed at [facility], [address] [place], [place] [address]    Special Requests   Final    BOTTLES DRAWN AEROBIC AND ANAEROBIC Blood Culture results may not be optimal due to an inadequate volume of blood received in culture bottles Performed at [facility], [address] [place], [place] [address]    Culture PENDING  Incomplete   Report Status PENDING  Incomplete     Radiology Studies: CT ABDOMEN PELVIS W CONTRAST Result Date: 11/04/2023 CLINICAL DATA:  Abdominal pain, acute, nonlocalized EXAM: CT ABDOMEN AND PELVIS WITH CONTRAST TECHNIQUE: Multidetector CT imaging of the abdomen and pelvis was performed using the standard protocol following bolus administration of intravenous contrast. RADIATION DOSE REDUCTION: This exam was performed according to the departmental dose-optimization program which includes automated exposure control, adjustment of the mA and/or kV according to patient size and/or use of iterative reconstruction technique. CONTRAST:  OMNIPAQUE  IOHEXOL  300 MG/ML  SOLN COMPARISON:  Chest x-ray 10/07/2023, CT abdomen pelvis 10/07/2023 FINDINGS: Lower chest: Small bilateral, left greater than right, pleural effusions. New on the right and grossly stable on the left. Hepatobiliary: No focal liver abnormality. No gallstones, gallbladder wall thickening, or pericholecystic fluid. No biliary dilatation. Pancreas: No focal lesion. Normal pancreatic contour. No surrounding inflammatory changes. No main pancreatic ductal dilatation. Spleen: Spleen is enlarged in caliber measuring up to 14 cm. Similar-appearing small splenic infarct. No focal lesion. Adrenals/Urinary Tract: No adrenal nodule bilaterally. Bilateral kidneys enhance symmetrically. The urinary bladder lumen is dilated with fluid with associated bilateral at least mild hydronephrosis. No nephroureterolithiasis. On delayed imaging, there is no urothelial wall thickening and there are no  filling defects in the opacified portions of the bilateral collecting systems or ureters. Minimal excretion of intravenous contrast noted bilaterally. Stomach/Bowel: Stomach is within normal limits. No evidence of bowel wall thickening or dilatation. The appendix is not definitely identified with no inflammatory changes in the right lower quadrant to suggest acute appendicitis. Vascular/Lymphatic: No abdominal aorta or iliac aneurysm. Mild atherosclerotic plaque of the aorta and its branches. Interval decrease in size of bulky and conglomerative retroperitoneal, pelvic, inguinal lymphadenopathy with as an  example a 4.4 cm left periaortic lymph node (from 6 cm) (2:31) as well as a 2.5 cm left cloquet lymph node (from 3.3 cm) (2:64). Reproductive: No mass. Other: Interval development of a poorly visualized distal left iliopsoas abscess formation measuring 2 x 1 x 6 cm (2:74, 7: 42). No intraperitoneal free fluid. No intraperitoneal free gas. No organized fluid collection. Musculoskeletal: Diffuse subcutaneus soft tissue edema. Interval worsening of a right iliac bone lytic lesion with associated 6 x 4.3 cm soft tissue mass (2:56). Mass erodes into the right sacral ala. Interval worsening of scattered lytic lesions of the vertebral bodies and sacral vertebral bodies. No acute displaced fracture. IMPRESSION: 1. Interval development of a 2 x 1 x 6 cm poorly visualized distal left iliopsoas abscess. 2. Interval development of a small right and grossly stable small left pleural effusions. 3. Urinary bladder lumen is dilated with fluid with associated bilateral at least mild hydronephrosis. This may reflect changes of obstructive uropathy or reflux. 4. Interval decrease in size of bulky and conglomerative retroperitoneal, pelvic, inguinal lymphadenopathy. 5. Interval worsening of axial and appendicular lytic metastases. Interval worsening of a right iliac bone lytic lesion with associated 6 x 4.3 cm soft tissue mass that  erodes into the right sacral ala. 6.  Aortic Atherosclerosis (ICD10-I70.0). Electronically Signed   By: Morgane  Naveau M.D.   On: 11/04/2023 22:14   VAS US  LOWER EXTREMITY VENOUS (DVT) Result Date: 11/04/2023  Lower Venous DVT Study Patient Name:  LEITH SZAFRANSKI  Date of Exam:   11/04/2023 Medical Rec #: [id number]            Accession #:    [id number] Date of Birth: 1955-07-25           Patient Gender: M Patient Age:   29 years Exam Location:  [facility] Procedure:      VAS US  LOWER EXTREMITY VENOUS (DVT) Referring Phys: AMRIT ADHIKARI --------------------------------------------------------------------------------  Indications: Edema (LLE).  Comparison Study: No previous exams Performing Technologist: Jody Hill RVT, RDMS  Examination Guidelines: A complete evaluation includes B-mode imaging, spectral Doppler, color Doppler, and power Doppler as needed of all accessible portions of each vessel. Bilateral testing is considered an integral part of a complete examination. Limited examinations for reoccurring indications may be performed as noted. The reflux portion of the exam is performed with the patient in reverse Trendelenburg.  +---------+---------------+---------+-----------+----------+--------------+ RIGHT    CompressibilityPhasicitySpontaneityPropertiesThrombus Aging +---------+---------------+---------+-----------+----------+--------------+ CFV      Full           Yes      Yes                                 +---------+---------------+---------+-----------+----------+--------------+ SFJ      Full                                                        +---------+---------------+---------+-----------+----------+--------------+ FV Prox  Full           Yes      Yes                                 +---------+---------------+---------+-----------+----------+--------------+ FV Mid   Full  Yes      Yes                                  +---------+---------------+---------+-----------+----------+--------------+ FV DistalFull           Yes      Yes                                 +---------+---------------+---------+-----------+----------+--------------+ PFV      Full                                                        +---------+---------------+---------+-----------+----------+--------------+ POP      Full           Yes      Yes                                 +---------+---------------+---------+-----------+----------+--------------+ PTV      Full                                                        +---------+---------------+---------+-----------+----------+--------------+ PERO     Full                                                        +---------+---------------+---------+-----------+----------+--------------+   +---------+---------------+---------+-----------+----------+--------------+ LEFT     CompressibilityPhasicitySpontaneityPropertiesThrombus Aging +---------+---------------+---------+-----------+----------+--------------+ CFV      Full           Yes      Yes                                 +---------+---------------+---------+-----------+----------+--------------+ SFJ      Full                                                        +---------+---------------+---------+-----------+----------+--------------+ FV Prox  Full           Yes      Yes                                 +---------+---------------+---------+-----------+----------+--------------+ FV Mid   Full           Yes      Yes                                 +---------+---------------+---------+-----------+----------+--------------+ FV DistalFull           Yes      Yes                                 +---------+---------------+---------+-----------+----------+--------------+  PFV      Full                                                         +---------+---------------+---------+-----------+----------+--------------+ POP      Full           Yes      Yes                                 +---------+---------------+---------+-----------+----------+--------------+ PTV      Full                                                        +---------+---------------+---------+-----------+----------+--------------+ PERO     Full                                                        +---------+---------------+---------+-----------+----------+--------------+     Summary: BILATERAL: - No evidence of deep vein thrombosis seen in the lower extremities, bilaterally. - RIGHT: - No cystic structure found in the popliteal fossa.  LEFT: - Ultrasound characteristics of a ruptured Baker's Cyst are noted.  *See table(s) above for measurements and observations. Electronically signed by Gaile New MD on 11/04/2023 at 8:26:24 PM.    Final     Scheduled Meds:  (feeding supplement) PROSource Plus  30 mL Oral TID BM   sodium chloride    Intravenous Once   acyclovir   400 mg Oral Daily   allopurinol   300 mg Oral Daily   Chlorhexidine  Gluconate Cloth  6 each Topical Daily   collagenase    Topical Daily   doxepin   25 mg Oral QHS   gabapentin   100 mg Oral TID   HYDROmorphone   2 mg Oral QHS   insulin  aspart  0-5 Units Subcutaneous QHS   insulin  aspart  0-9 Units Subcutaneous TID WC   nutrition supplement (JUVEN)  1 packet Oral BID WC   pantoprazole   40 mg Oral QHS   sertraline   25 mg Oral Daily   simvastatin   20 mg Oral QHS   Continuous Infusions:  ceFEPime  (MAXIPIME ) IV 2 g (11/06/23 0920)   metronidazole  500 mg (11/05/23 2113)   sodium bicarbonate  150 mEq in sterile water  1,150 mL infusion 75 mL/hr at 11/06/23 0723   vancomycin        LOS: 4 days   Joette Pebbles, MD [facility] P9/27/2025, 9:22 AM

## 2023-11-06 NOTE — Consult Note (Signed)
 Consultation Note Date: 11/06/2023   Patient Name: Tyler Scott  DOB: Sep 30, 1955  MRN: [id number]  Age / Sex: 68 y.o., male   PCP: [facility] Referring Physician: Verdene Purchase, MD  Reason for Consultation: Establishing goals of care     Chief Complaint/History of Present Illness:   Patient is a 68 year old male with a past medical history of diabetes mellitus type 2, hypertension, CLL, ITP, pancytopenia, hypertension, and hyperlipidemia who was admitted on 11/01/2023 from skilled nursing facility for management of weakness and abnormal lab work.  Since admission, patient has received management for symptomatic anemia, pancytopenia, new left iliopsoas abscess, urinary retention, new sacral pressure ulcer developed at SNF reported POA, and failure to thrive.  Palliative medicine team consulted to assist with complex medical decision making. Of note patient known to PMT team from prior admissions.  Extensive review of EMR including recent documentation from oncology and hospitalist.  Patient is noted to have multiple no-shows regarding cancer directed therapies.  It is currently suspected that there has been transformation of patient's CLL to aggressive lymphoma with suspected bone marrow involvement.  With regards to patient's new left iliopsoas abscess, patient is not a candidate to undergo surgical intervention or IR intervention for drainage.  Patient has been receiving antibiotics to help assist with management.  Patient noted to be essentially nonambulatory at this point.  As per EMR review, patient's sacral pressure injury noted to be unstageable at this time.  Patient having minimal to no oral intake. Reviewed recent BMP noting creatinine elevated to 1.45 and estimated GFR at 53.  Patient's recent CBC noting WBC low down trended to 3.6, hemoglobin low at 7.5, platelets trending down to 60. Personally reviewed patient's recent CT abdomen pelvis noting left iliopsoas  abscess.  Patient also noted to have worsening of axial and appendicular lytic metastases as well as worsening of right iliac bone lytic lesion with associated soft tissue mass that arose into his sacral ala. At time of EMR review on past 24 hours patient has received as needed IV Dilaudid  0.5 mg x 1 dose.  Patient receiving p.o. Dilaudid  tablet 2 mg nightly and gabapentin  100 mg 3 times daily.  Patient has also received as needed tramadol  50 mg x 1 dose.  ------------------------------------------------------------------------------------------------------------- Advance Care Planning Conversation  Pertinent diagnosis: Known CLL with concern about progression to aggressive lymphoma, symptomatic anemia, pancytopenia, new left iliopsoas abscess, urinary retention, unstageable sacral pressure ulcer, poor oral intake, debility  The patient and family consented to a voluntary Advance Care Planning Conversation in person. Individuals present for the conversation: Discussed care with patient and wife at bedside.  Summary of the conversation:  Presented to bedside to see patient.  Patient laying comfortably in bed.  Patient's wife present at bedside.  Able to introduce myself as a member of the palliative medicine team my role in patient's medical journey.  Spent time inquiring about recent updates patient has received.  Patient noted hearing he is not a candidate for further cancer directed therapies because he is too weak.  With permission able to discuss further patient's medical comorbidities.  Expressed concern about patient's progression of cancer and likely bone marrow involvement affecting his pancytopenia.  Expressed concern about patient's iliopsoas abscess that cannot be drained.  Patient having minimal to no oral intake and worsening kidney function.  Spent time answering questions as able regarding this.  With permission, able to discuss pathways for medical care moving forward.  Expressed concern  that with patient's cancer  progression and him not being a candidate for further cancer directed therapies, patient's time is growing short.  Able to discuss pathway of transitioning to comfort focused care and what this would and would not entail.  Discussed continuing medications focused on symptom management at end-of-life.  Discussed discontinuing interventions such as IV fluids, imaging, and lab work.  Spent time answering questions regarding this.   After discussion and answering questions, patient and wife supporting transition to comfort focused care at this time.  Along with transition to comfort focused care, able to discuss patient's CODE STATUS.  Explained full code versus DNR/DNI.  Patient appropriately agreeing with change of CODE STATUS to DNR/DNI with focusing on comfort at this time.  Able to discuss philosophy of hospice support outside of the hospital.  Explained home with hospice support and what this would and would not provide.  Wife cannot care for patient at home as it is only her providing care.  Discussed if patient cannot receive 24/7 care assistance at home with hospice, would need to consider long-term care placement with hospice.  Patient had come from nursing facility though unsure if could return there under long-term care with hospice support.  Also discussed that if patient acutely worsens, may become appropriate for inpatient hospice.  Explained that to be appropriate for inpatient hospice, would need to have symptoms that require frequent IV medications and adjustments and cannot be managed in the home setting.  Spent time answering questions as able regarding this.  At this time we will plan to transition to full comfort focused care.  Continuing monitoring and determine if patient would become appropriate for inpatient hospice, considering long-term care placement with hospice if does not become inpatient hospice appropriate.  All questions answered at that time.  Noted  palliative medicine team to continue to follow with patient's medical journey.  Outcome of the conversations and/or documents completed:  Transition to comfort focused care at this time.  CODE STATUS appropriately updated to DNR/DNI.  I spent 35 minutes providing separately identifiable ACP services with the patient and/or surrogate decision maker in a voluntary, in-person conversation discussing the patient's wishes and goals as detailed in the above note.  Tinnie Radar, DO Palliative Medicine Provider  -------------------------------------------------------------------------------------------------------------  Discussed care with hospitalist, RN, and oncologist regarding transition to comfort focused care at this time.  Primary Diagnoses  Present on Admission:  Symptomatic anemia  Cancer associated pain  Chronic ITP (idiopathic thrombocytopenia) (HCC)  CLL (chronic lymphocytic leukemia) (HCC)  Essential hypertension  Pressure injury of skin  Lymphoma, low grade (HCC)  Hyperlipidemia  Hyperkalemia  Diarrhea   Past Medical History:  Diagnosis Date   Allergy    Arthritis    CLL (chronic lymphocytic leukemia) (HCC)    stage III: asymptomatic; on observation   DIABETES MELLITUS, TYPE I, UNCONTROLLED 12/30/2006   insulin    Diabetic retinopathy    ERECTILE DYSFUNCTION, ORGANIC 12/30/2006   Hx of adenomatous polyp of colon 05/24/2014   HYPERLIPIDEMIA 12/30/2006   HYPERTENSION 12/30/2006   LOW BACK PAIN, ACUTE 12/21/2006   Social History   Socioeconomic History   Marital status: Married    Spouse name: Not on file   Number of children: Not on file   Years of education: Not on file   Highest education level: Not on file  Occupational History   Not on file  Tobacco Use   Smoking status: Never   Smokeless tobacco: Never  Vaping Use   Vaping status: Never Used  Substance  and Sexual Activity   Alcohol  use: Yes    Alcohol /week: 4.0 standard drinks of alcohol     Types: 4  Glasses of wine per week   Drug use: No   Sexual activity: Not on file    Comment: regular exercise - yes  Other Topics Concern   Not on file  Social History Narrative   Not on file   Social Drivers of Health   Financial Resource Strain: Not on file  Food Insecurity: No Food Insecurity (11/01/2023)   Hunger Vital Sign    Worried About Running Out of Food in the Last Year: Never true    Ran Out of Food in the Last Year: Never true  Transportation Needs: No Transportation Needs (11/01/2023)   PRAPARE - Transportation    Lack of Transportation (Medical): No    Lack of Transportation (Non-Medical): No  Physical Activity: Not on file  Stress: Not on file  Social Connections: Moderately Isolated (11/01/2023)   Social Connection and Isolation Panel    Frequency of Communication with Friends and Family: Three times a week    Frequency of Social Gatherings with Friends and Family: Twice a week    Attends Religious Services: Never    Database administrator or Organizations: No    Attends Engineer, structural: Never    Marital Status: Married   Family History  Problem Relation Age of Onset   Alcohol  abuse Other    Colon cancer Brother 81       Died 28-Sep-2013   Diabetes Mellitus II Mother    Esophageal cancer Neg Hx    Rectal cancer Neg Hx    Stomach cancer Neg Hx    Scheduled Meds:  (feeding supplement) PROSource Plus  30 mL Oral TID BM   sodium chloride    Intravenous Once   acyclovir   400 mg Oral Daily   allopurinol   300 mg Oral Daily   Chlorhexidine  Gluconate Cloth  6 each Topical Daily   collagenase    Topical Daily   doxepin   25 mg Oral QHS   gabapentin   100 mg Oral TID   HYDROmorphone   2 mg Oral QHS   insulin  aspart  0-5 Units Subcutaneous QHS   insulin  aspart  0-9 Units Subcutaneous TID WC   nutrition supplement (JUVEN)  1 packet Oral BID WC   pantoprazole   40 mg Oral QHS   sertraline   25 mg Oral Daily   simvastatin   20 mg Oral QHS   Continuous Infusions:   ceFEPime  (MAXIPIME ) IV 2 g (11/06/23 0920)   metronidazole  500 mg (11/06/23 1048)   sodium bicarbonate  150 mEq in sterile water  1,150 mL infusion 100 mL/hr at 11/06/23 1049   vancomycin  750 mg (11/06/23 0936)   PRN Meds:.acetaminophen  **OR** acetaminophen , HYDROmorphone  (DILAUDID ) injection, methocarbamol , ondansetron  **OR** ondansetron  (ZOFRAN ) IV, traMADol  No Known Allergies CBC:    Component Value Date/Time   WBC 3.6 (L) 11/06/2023 0836   HGB 7.5 (L) 11/06/2023 0836   HGB 8.2 (L) 10/06/2023 1218   HGB 12.8 (L) 01/01/2017 0809   HCT 23.6 (L) 11/06/2023 0836   HCT 38.5 01/01/2017 0809   PLT 60 (L) 11/06/2023 0836   PLT 47 (L) 10/06/2023 1218   PLT 224 01/01/2017 0809   MCV 92.2 11/06/2023 0836   MCV 94.7 01/01/2017 0809   NEUTROABS 0.6 (L) 10/22/2023 0524   NEUTROABS 4.5 01/01/2017 0809   LYMPHSABS 4.6 (H) 10/22/2023 0524   LYMPHSABS 32.4 (H) 01/01/2017 0809   MONOABS 0.5 10/22/2023 0524  MONOABS 0.9 01/01/2017 0809   EOSABS 0.1 10/22/2023 0524   EOSABS 0.4 01/01/2017 0809   BASOSABS 0.1 10/22/2023 0524   BASOSABS 0.0 01/01/2017 0809   Comprehensive Metabolic Panel:    Component Value Date/Time   NA 136 11/06/2023 0836   NA 142 01/01/2017 0809   K 3.5 11/06/2023 0836   K 4.4 01/01/2017 0809   CL 103 11/06/2023 0836   CL 102 04/25/2012 1003   CO2 18 (L) 11/06/2023 0836   CO2 26 01/01/2017 0809   BUN 62 (H) 11/06/2023 0836   BUN 15.7 01/01/2017 0809   CREATININE 1.45 (H) 11/06/2023 0836   CREATININE 1.07 10/06/2023 1218   CREATININE 0.9 01/01/2017 0809   GLUCOSE 157 (H) 11/06/2023 0836   GLUCOSE 89 01/01/2017 0809   GLUCOSE 170 (H) 04/25/2012 1003   CALCIUM  7.8 (L) 11/06/2023 0836   CALCIUM  9.1 01/01/2017 0809   AST 18 11/02/2023 0607   AST 26 10/06/2023 1218   AST 22 01/01/2017 0809   ALT 14 11/02/2023 0607   ALT 12 10/06/2023 1218   ALT 19 01/01/2017 0809   ALKPHOS 69 11/02/2023 0607   ALKPHOS 68 01/01/2017 0809   BILITOT 1.1 11/02/2023 0607   BILITOT  1.1 10/06/2023 1218   BILITOT 0.75 01/01/2017 0809   PROT 4.7 (L) 11/02/2023 0607   PROT 7.0 01/01/2017 0809   ALBUMIN 3.0 (L) 11/02/2023 0607   ALBUMIN 4.2 01/01/2017 0809    Physical Exam: Vital Signs: BP 130/64 (BP Location: Left Arm)   Pulse (!) 105   Temp 99.7 F (37.6 C) (Oral)   Resp 16   Ht 5' 8 (1.727 m)   Wt 58.5 kg   SpO2 98%   BMI 19.61 kg/m  SpO2: SpO2: 98 % O2 Device: O2 Device: Room Air O2 Flow Rate:   Intake/output summary:  Intake/Output Summary (Last 24 hours) at 11/06/2023 1302 Last data filed at 11/06/2023 1100 Gross per 24 hour  Intake --  Output 2350 ml  Net -2350 ml   LBM: Last BM Date : 11/05/23 Baseline Weight: Weight: 58.5 kg Most recent weight: Weight: 58.5 kg  General: NAD, alert, cachectic, frail, chronically ill-appearing Cardiovascular: RRR, edema in feet bilaterally Respiratory: no increased work of breathing noted, not in respiratory distress Neuro: A&Ox4, following commands easily Psych: appropriately answers all questions          Palliative Performance Scale: 30%              Additional Data Reviewed: Recent Labs    11/05/23 0531 11/06/23 0836  WBC 5.6 3.6*  HGB 9.3* 7.5*  PLT 72* 60*  NA 134* 136  BUN 73* 62*  CREATININE 1.39* 1.45*    Imaging: CT ABDOMEN PELVIS W CONTRAST CLINICAL DATA:  Abdominal pain, acute, nonlocalized  EXAM: CT ABDOMEN AND PELVIS WITH CONTRAST  TECHNIQUE: Multidetector CT imaging of the abdomen and pelvis was performed using the standard protocol following bolus administration of intravenous contrast.  RADIATION DOSE REDUCTION: This exam was performed according to the departmental dose-optimization program which includes automated exposure control, adjustment of the mA and/or kV according to patient size and/or use of iterative reconstruction technique.  CONTRAST:  OMNIPAQUE  IOHEXOL  300 MG/ML  SOLN  COMPARISON:  Chest x-ray 10/07/2023, CT abdomen pelvis  10/07/2023  FINDINGS: Lower chest: Small bilateral, left greater than right, pleural effusions. New on the right and grossly stable on the left.  Hepatobiliary: No focal liver abnormality. No gallstones, gallbladder wall thickening, or pericholecystic fluid. No  biliary dilatation.  Pancreas: No focal lesion. Normal pancreatic contour. No surrounding inflammatory changes. No main pancreatic ductal dilatation.  Spleen: Spleen is enlarged in caliber measuring up to 14 cm. Similar-appearing small splenic infarct. No focal lesion.  Adrenals/Urinary Tract:  No adrenal nodule bilaterally.  Bilateral kidneys enhance symmetrically.  The urinary bladder lumen is dilated with fluid with associated bilateral at least mild hydronephrosis. No nephroureterolithiasis.  On delayed imaging, there is no urothelial wall thickening and there are no filling defects in the opacified portions of the bilateral collecting systems or ureters. Minimal excretion of intravenous contrast noted bilaterally.  Stomach/Bowel: Stomach is within normal limits. No evidence of bowel wall thickening or dilatation. The appendix is not definitely identified with no inflammatory changes in the right lower quadrant to suggest acute appendicitis.  Vascular/Lymphatic: No abdominal aorta or iliac aneurysm. Mild atherosclerotic plaque of the aorta and its branches. Interval decrease in size of bulky and conglomerative retroperitoneal, pelvic, inguinal lymphadenopathy with as an example a 4.4 cm left periaortic lymph node (from 6 cm) (2:31) as well as a 2.5 cm left cloquet lymph node (from 3.3 cm) (2:64).  Reproductive: No mass.  Other: Interval development of a poorly visualized distal left iliopsoas abscess formation measuring 2 x 1 x 6 cm (2:74, 7: 42). No intraperitoneal free fluid. No intraperitoneal free gas. No organized fluid collection.  Musculoskeletal:  Diffuse subcutaneus soft tissue edema.  Interval  worsening of a right iliac bone lytic lesion with associated 6 x 4.3 cm soft tissue mass (2:56). Mass erodes into the right sacral ala. Interval worsening of scattered lytic lesions of the vertebral bodies and sacral vertebral bodies. No acute displaced fracture.  IMPRESSION: 1. Interval development of a 2 x 1 x 6 cm poorly visualized distal left iliopsoas abscess. 2. Interval development of a small right and grossly stable small left pleural effusions. 3. Urinary bladder lumen is dilated with fluid with associated bilateral at least mild hydronephrosis. This may reflect changes of obstructive uropathy or reflux. 4. Interval decrease in size of bulky and conglomerative retroperitoneal, pelvic, inguinal lymphadenopathy. 5. Interval worsening of axial and appendicular lytic metastases. Interval worsening of a right iliac bone lytic lesion with associated 6 x 4.3 cm soft tissue mass that erodes into the right sacral ala. 6.  Aortic Atherosclerosis (ICD10-I70.0).  Electronically Signed   By: Morgane  Naveau M.D.   On: 11/04/2023 22:14 VAS US  LOWER EXTREMITY VENOUS (DVT)  Lower Venous DVT Study  Patient Name:  Tyler Scott  Date of Exam:   11/04/2023 Medical Rec #: [id number]            Accession #:    [id number] Date of Birth: 14-Mar-1955           Patient Gender: M Patient Age:   35 years Exam Location:  [facility] Procedure:      VAS US  LOWER EXTREMITY VENOUS (DVT) Referring Phys: AMRIT ADHIKARI  --------------------------------------------------------------------------------   Indications: Edema (LLE).   Comparison Study: No previous exams  Performing Technologist: Jody Hill RVT, RDMS    Examination Guidelines: A complete evaluation includes B-mode imaging, spectral Doppler, color Doppler, and power Doppler as needed of all accessible portions of each vessel. Bilateral testing is considered an integral part of a complete examination. Limited examinations  for reoccurring indications may be performed as noted. The reflux portion of the exam is performed with the patient in reverse Trendelenburg.     +---------+---------------+---------+-----------+----------+--------------+ RIGHT    CompressibilityPhasicitySpontaneityPropertiesThrombus Aging +---------+---------------+---------+-----------+----------+--------------+ CFV  Full           Yes      Yes                                 +---------+---------------+---------+-----------+----------+--------------+ SFJ      Full                                                        +---------+---------------+---------+-----------+----------+--------------+ FV Prox  Full           Yes      Yes                                 +---------+---------------+---------+-----------+----------+--------------+ FV Mid   Full           Yes      Yes                                 +---------+---------------+---------+-----------+----------+--------------+ FV DistalFull           Yes      Yes                                 +---------+---------------+---------+-----------+----------+--------------+ PFV      Full                                                        +---------+---------------+---------+-----------+----------+--------------+ POP      Full           Yes      Yes                                 +---------+---------------+---------+-----------+----------+--------------+ PTV      Full                                                        +---------+---------------+---------+-----------+----------+--------------+ PERO     Full                                                        +---------+---------------+---------+-----------+----------+--------------+        +---------+---------------+---------+-----------+----------+--------------+ LEFT     CompressibilityPhasicitySpontaneityPropertiesThrombus  Aging +---------+---------------+---------+-----------+----------+--------------+ CFV      Full           Yes      Yes                                 +---------+---------------+---------+-----------+----------+--------------+ SFJ  Full                                                        +---------+---------------+---------+-----------+----------+--------------+ FV Prox  Full           Yes      Yes                                 +---------+---------------+---------+-----------+----------+--------------+ FV Mid   Full           Yes      Yes                                 +---------+---------------+---------+-----------+----------+--------------+ FV DistalFull           Yes      Yes                                 +---------+---------------+---------+-----------+----------+--------------+ PFV      Full                                                        +---------+---------------+---------+-----------+----------+--------------+ POP      Full           Yes      Yes                                 +---------+---------------+---------+-----------+----------+--------------+ PTV      Full                                                        +---------+---------------+---------+-----------+----------+--------------+ PERO     Full                                                        +---------+---------------+---------+-----------+----------+--------------+             Summary: BILATERAL: - No evidence of deep vein thrombosis seen in the lower extremities, bilaterally. - RIGHT:  - No cystic structure found in the popliteal fossa.   LEFT:  - Ultrasound characteristics of a ruptured Baker's Cyst are noted.    *See table(s) above for measurements and observations.  Electronically signed by Gaile New MD on 11/04/2023 at 8:26:24 PM.      Final      I personally reviewed recent imaging.   Palliative  Care Assessment and Plan Summary of Established Goals of Care and Medical Treatment Preferences   Patient is a 68 year old male with a past medical history of diabetes mellitus type 2, hypertension, CLL, ITP, pancytopenia, hypertension, and hyperlipidemia who was admitted on  11/01/2023 from skilled nursing facility for management of weakness and abnormal lab work.  Since admission, patient has received management for symptomatic anemia, pancytopenia, new left iliopsoas abscess, urinary retention, new sacral pressure ulcer developed at SNF reported POA, and failure to thrive.  Palliative medicine team consulted to assist with complex medical decision making. Of note patient known to PMT team from prior admissions.  # Complex medical decision making/goals of care  # Complex medical decision making/goals of care  - Discussed care extensively with patient and wife as detailed above in HPI.  Spent time reviewing patient's current medical illness and progression of underlying cancer.  Discussed pathways for medical care moving forward.  At this time patient and wife supporting of transition to full comfort focused care.  Discussed involvement of hospice support.  Wife unable to care for patient 24/7 at home.  Noted would need to seek long-term care with hospice or patient may become appropriate for inpatient hospice.  Continuing to monitor to determine appropriate setting for hospice care.  Palliative medicine team continuing to follow with patient's medical journey.  -At this time we will discontinue interventions that are no longer focused on comfort such as IV fluids, imaging, or lab work.  Will instead focus on symptom management of pain, dyspnea, and agitation in the setting of end-of-life care.    Code Status: Do not attempt resuscitation (DNR) - Comfort care  # Symptom management Patient is receiving these palliative interventions for symptom management with an intent to improve quality of life.      -Pain/Dyspnea, acute in the setting of end-of-life care                Patient was not on medications for pain previously.                               - Change IV Dilaudid  to 0.5-1 mg every 1 hour as needed.  Continue to adjust based on patient's symptom burden.  If patient needing frequent dosing, may need to consider continuous infusion.   - Continue gabapentin  100 mg 3 times daily   - Continue oral Dilaudid  tablet 2 mg nightly                  -Anxiety/agitation, in the setting of end-of-life care                               -Start Haldol as needed. Continue to adjust based on patient's symptom burden.                   -Secretions, in the setting of end-of-life care                               -Start glycopyrrolate as needed.   # Psycho-social/Spiritual Support:  - Support System: Wife  # Discharge Planning:  To Be Determined  Thank you for allowing the palliative care team to participate in the care [name].  Tinnie Radar, DO Palliative Care Provider PMT # [phone]  If patient remains symptomatic despite maximum doses, please call PMT at [phone] between 0700 and 1900. Outside of these hours, please call attending, as PMT does not have night coverage.

## 2023-11-07 DIAGNOSIS — C9110 Chronic lymphocytic leukemia of B-cell type not having achieved remission: Secondary | ICD-10-CM | POA: Diagnosis not present

## 2023-11-07 DIAGNOSIS — E43 Unspecified severe protein-calorie malnutrition: Secondary | ICD-10-CM | POA: Diagnosis not present

## 2023-11-07 DIAGNOSIS — D649 Anemia, unspecified: Secondary | ICD-10-CM | POA: Diagnosis not present

## 2023-11-07 DIAGNOSIS — Z7189 Other specified counseling: Secondary | ICD-10-CM

## 2023-11-07 DIAGNOSIS — L0291 Cutaneous abscess, unspecified: Secondary | ICD-10-CM | POA: Diagnosis not present

## 2023-11-07 NOTE — Progress Notes (Addendum)
 PROGRESS NOTE  Tyler Scott  FMW:[id number] DOB: 1955/08/19 DOA: 11/01/2023 PCP: [facility]   Brief Narrative: Patient is a 68 year old male with history of insulin -dependent type type II, hypertension, CLL, ITP, pancytopenia, hypertension, hyperlipidemia who presented [facility] the emergency department from [facility]  for evaluation of weakness, abnormal labs.  On presentation ,hemoglobin was 5.2, platelets of 76, creatinine of 1.3.  He was hemodynamically stable on presentation.  2 units  of blood transfusion ordered.  Oncology is following.  Also found to have sacral ulcer, general surgery consulted, PT doing hydrotherapy.  Abdomen CT/pelvis done on 9/25 showed left iliopsoas abscess.  General surgery/IR declined any kind of intervention saying it is not amenable for drainage.  Currently on broad-spectrum antibiotics .  Palliative care also consulted for goals of care  Assessment & Plan:  Symptomatic anemia: Has history of chronic pancytopenia secondary to CLL.  Hemoglobin was in the range of 5 on presentation.  It appears that patient was transfused about 4 units of PRBC. Hemoglobin has responded.  Drift down noted again.  No overt bleeding noted. Now transitioned to comfort care/hospice.  Not checking labs daily.  CLL/pancytopenia:  History of CLL, was on different therapies although he had had several no-shows according to oncology notes.  Follows with Dr. Onesimo.  Oncology suspecting transformation of CLL to aggressive lymphoma.  Suspicion of bone marrow involvement.   Has chronic thrombocytopenia, suspected to be from chronic ITP.   CT imaging also showed worsening of lytic mets, worsening of right iliac bone lytic lesions and 6 X 4.3 cm soft tissue mass that erodes into right sacral ala. Oncology is following.  Left iliopsoas abscess:  As seen on CT.  Discussed with general surgery.  General surgery says it is out of the scope of their practice.  IR consulted.  IR mentioned that  the collection is not amenable for drainage.  Recommended treatment with antibiotics.   Patient was treated with broad-spectrum antibiotics with vancomycin  cefepime  and metronidazole .  Now transitioned to comfort.  Antibiotics have been discontinued.  Urinary retention Found to have urinary distention with mild hydronephrosis on CT scan.  Foley placed with removal of 1 L of urine.   Continue Flomax.  Plan is for transition to hospice.  Will leave Foley catheter in for comfort for now.    Acute kidney injury/metabolic acidosis Was given IV fluids.  Labs were stable when last checked.      Hypertension:  Soft blood pressures were noted.  Metoprolol  was discontinued.  Blood pressures appear to have rebounded.      Insulin -dependent diabetes type 2: Takes insulin  at home.  Continue sliding scale only.  Hemoglobin A1c was 6.5.  CBGs are reasonably well-controlled.  Diarrhea: Stool studies were ordered however diarrhea stopped before his stool sample could be sent.  Terminal x-ray did not show any acute findings.    Failure to thrive/generalized weakness:  Patient is cachectic and severely malnourished.  Has poor appetite.  Left lower extremity edema: Negative venous Doppler  Sacral pressure ulcer: Initially developed while at SNF.  Wound care nurse saw him here and recommended general surgery consultation for possible debridement . General surgery consulted.  General surgery recommended hydrotherapy and signed off.  PT following for hydrotherapy.  Goals of care: Palliative care has been consulted to go over goals of care with patient and family. They met with patient and his wife yesterday.  Changed over to DNR.  Plan is for transition to hospice.  Disposition is  unclear.  Severe protein calorie malnutrition Nutrition Problem: Severe Malnutrition Etiology: chronic illness, cancer and cancer related treatments  DVT prophylaxis: CODE STATUS: DNR Family Communication: No family at bedside  today. Disposition: To be determined.    Consultants: Oncology.  Palliative care  Procedures:None  Subjective: Lying in the bed.  Denies any complaints.  No family at bedside.  Objective: Vitals:   11/05/23 2022 11/06/23 0036 11/06/23 1332 11/06/23 1413  BP: (!) 141/62 130/64 (!) 113/56   Pulse: (!) 103 (!) 105 95   Resp: 16 16 18    Temp: 99.6 F (37.6 C) 99.7 F (37.6 C)  98.4 F (36.9 C)  TempSrc: Oral Oral  Oral  SpO2: 100% 98% 100%   Weight:      Height:        Intake/Output Summary (Last 24 hours) at 11/07/2023 0926 Last data filed at 11/07/2023 0443 Gross per 24 hour  Intake --  Output 1750 ml  Net -1750 ml   Filed Weights   11/01/23 1526  Weight: 58.5 kg    Examination:  General appearance: Awake alert.  In no distress Resp: Clear to auscultation bilaterally.  Normal effort Cardio: S1-S2 is normal regular.  No S3-S4.  No rubs murmurs or bruit GI: Abdomen is soft.  Nontender nondistended.  Bowel sounds are present normal.  No masses organomegaly Extremities: Significant physical deconditioning is noted   Data Reviewed:   CBC: Recent Labs  Lab 11/02/23 0607 11/03/23 0602 11/04/23 0620 11/04/23 2326 11/05/23 0531 11/06/23 0836  WBC 7.1 5.2 4.9  --  5.6 3.6*  HGB 7.7* 8.0* 7.1* 10.0* 9.3* 7.5*  HCT 23.6* 25.2* 22.6* 29.7* 28.8* 23.6*  MCV 90.4 91.6 91.9  --  89.4 92.2  PLT 82* 82* 73*  --  72* 60*   Basic Metabolic Panel: Recent Labs  Lab 11/01/23 1544 11/02/23 0607 11/03/23 0602 11/05/23 0531 11/06/23 0836  NA 134* 135 135 134* 136  K 5.7* 5.0 4.4 4.3 3.5  CL 103 104 103 104 103  CO2 17* 17* 19* 16* 18*  GLUCOSE 359* 319* 185* 157* 157*  BUN 62* 56* 49* 73* 62*  CREATININE 1.34* 1.11 1.06 1.39* 1.45*  CALCIUM  8.2* 7.9* 8.0* 8.0* 7.8*     Recent Results (from the past 240 hours)  Culture, blood (Routine X 2) w Reflex to ID Panel     Status: None (Preliminary result)   Collection Time: 11/05/23  9:50 AM   Specimen: BLOOD RIGHT ARM   Result Value Ref Range Status   Specimen Description   Final    BLOOD RIGHT ARM Performed at [facility], [address] [place], [place] [address]    Special Requests   Final    BOTTLES DRAWN AEROBIC AND ANAEROBIC Blood Culture results may not be optimal due to an inadequate volume of blood received in culture bottles Performed at [facility], [address] [place], [place] [address]    Culture   Final    NO GROWTH < 24 HOURS Performed at [facility], [address] [place], [place] [address]    Report Status PENDING  Incomplete  Culture, blood (Routine X 2) w Reflex to ID Panel     Status: None (Preliminary result)   Collection Time: 11/05/23  9:50 AM   Specimen: BLOOD LEFT ARM  Result Value Ref Range Status   Specimen Description   Final    BLOOD LEFT ARM Performed at [facility], [address], [place], [place] [address]    Special Requests   Final    BOTTLES DRAWN AEROBIC AND ANAEROBIC Blood Culture results may not be optimal due to an inadequate volume of blood received in culture bottles Performed at [facility], [address] [place], [place] [address]    Culture   Final    NO GROWTH < 24 HOURS Performed at [facility], [address] [place], [place] [address]    Report Status PENDING  Incomplete     Radiology Studies: No results found.   Scheduled Meds:  allopurinol   300 mg Oral Daily   collagenase    Topical Daily   doxepin   25 mg Oral QHS   gabapentin   100 mg Oral TID   HYDROmorphone   2 mg Oral QHS   pantoprazole   40 mg Oral QHS   sertraline   25 mg Oral Daily   Continuous Infusions:     LOS: 5 days   Jasmon Mattice, MD [facility] P9/28/2025, 9:26 AM

## 2023-11-07 NOTE — Progress Notes (Signed)
 Daily Progress Note   Patient Name: Tyler Scott       Date: 11/07/2023 DOB: 1955/09/02  Age: 68 y.o. MRN#: [id number] Attending Physician: Verdene Purchase, MD Primary Care Physician: [facility] Admit Date: 11/01/2023 Length of Stay: 5 days  Reason for Consultation/Follow-up: Establishing goals of care and symptom management  Subjective:   CC: Patient notes pain managed with current medications.  Following up regarding complex medical decision making and symptom management.  Subjective:  Reviewed EMR including recent documentation from hospitalist.  At time of EMR review in past 24 hours patient has received as needed IV Dilaudid  1 mg x 3 doses. Discussed care with RN for medical updates.  Patient has oral intake.  Presented to bedside to see patient.  No visitors present at bedside.  Able to discuss symptom management with patient at this time.  Patient feels that IV Dilaudid  appropriately assist with pain management at this time.  Patient noted that he is eating a little and ordered of a bagel for breakfast he hopes to enjoy.  Discussed at this time continuing comfort focused care and monitoring for appropriateness of disposition whether that be to inpatient hospice or seeking long-term care with hospice support.  Spent time providing emotional support via active listening.  All questions answered as able at that time.  Noted palliative medicine team to continue to follow in patient's medical journey.  Objective:   Vital Signs:  BP (!) 113/56 (BP Location: Left Arm)   Pulse 95   Temp 98.4 F (36.9 C) (Oral)   Resp 18   Ht 5' 8 (1.727 m)   Wt 58.5 kg   SpO2 100%   BMI 19.61 kg/m   Physical Exam: General: NAD, alert, cachectic, frail, chronically ill-appearing Cardiovascular: RRR, edema in feet bilaterally Respiratory: no increased work of breathing noted, not in respiratory distress Neuro: A&Ox4, following commands easily Psych: appropriately answers all  questions  Assessment & Plan:   Assessment: Patient is a 68 year old male with a past medical history of diabetes mellitus type 2, hypertension, CLL, ITP, pancytopenia, hypertension, and hyperlipidemia who was admitted on 11/01/2023 from skilled nursing facility for management of weakness and abnormal lab work.  Since admission, patient has received management for symptomatic anemia, pancytopenia, new left iliopsoas abscess, urinary retention, new sacral pressure ulcer developed at SNF reported POA, and failure to thrive.  Palliative medicine team consulted to assist with complex medical decision making. Of note patient known to PMT team from prior admissions.  Recommendations/Plan: # Complex medical decision making/goals of care:  - Discussed care patient as detailed above in HPI.  Had extensive conversation with patient and wife on 11/06/2023 and patient was transition to comfort focused care at that time.  Have already discussed involvement of hospice support.  Wife unable to care for patient 24/7 at home.  Noted would need to seek long-term care with hospice or patient would need to become appropriate for inpatient hospice.  Continuing to monitor to determine appropriate setting for hospice care.  Palliative medicine team continuing to follow with patient's medical journey.                - Have already discontinued interventions that are no longer focused on comfort such as IV fluids, imaging, or lab work.  Focusing on symptom management of pain, dyspnea, and agitation in the setting of end-of-life care.                  Code Status: Do not  attempt resuscitation (DNR) - Comfort care   # Symptom management Patient is receiving these palliative interventions for symptom management with an intent to improve quality of life.                   -Pain/Dyspnea, acute in the setting of end-of-life care                Patient was not on medications for pain previously.                               -  Continue IV Dilaudid  to 0.5-1 mg every 1 hour as needed.  Continue to adjust based on patient's symptom burden.  If patient needing frequent dosing, may need to consider continuous infusion.                               - Continue gabapentin  100 mg 3 times daily                               - Continue oral Dilaudid  tablet 2 mg nightly                  -Anxiety/agitation, in the setting of end-of-life care                               - Continue Haldol as needed. Continue to adjust based on patient's symptom burden.                   -Secretions, in the setting of end-of-life care                               - Continue glycopyrrolate as needed.   # Psycho-social/Spiritual Support:  - Support System: Wife  # Discharge Planning: To Be Determined - Patient and wife have discussed that if patient is to go to inpatient hospice if appropriate, would want referral to [facility].  Thank you for allowing the palliative care team to participate in the care [name].  Tinnie Radar, DO Palliative Care Provider PMT # [phone]  If patient remains symptomatic despite maximum doses, please call PMT at [phone] between 0700 and 1900. Outside of these hours, please call attending, as PMT does not have night coverage.

## 2023-11-07 NOTE — Plan of Care (Signed)
   Problem: Clinical Measurements: Goal: Will remain free from infection Outcome: Progressing   Problem: Pain Managment: Goal: General experience of comfort will improve and/or be controlled Outcome: Progressing   Problem: Safety: Goal: Ability to remain free from injury will improve Outcome: Progressing

## 2023-11-08 DIAGNOSIS — D693 Immune thrombocytopenic purpura: Secondary | ICD-10-CM | POA: Diagnosis not present

## 2023-11-08 DIAGNOSIS — C9110 Chronic lymphocytic leukemia of B-cell type not having achieved remission: Secondary | ICD-10-CM

## 2023-11-08 DIAGNOSIS — Z7189 Other specified counseling: Secondary | ICD-10-CM

## 2023-11-08 DIAGNOSIS — D649 Anemia, unspecified: Secondary | ICD-10-CM

## 2023-11-08 MED ORDER — GLYCOPYRROLATE 0.2 MG/ML IJ SOLN: 0.2000 mg | INTRAMUSCULAR | Status: AC | PRN

## 2023-11-08 MED ORDER — HYDROMORPHONE HCL 1 MG/ML IJ SOLN: 0.5000 mg | INTRAMUSCULAR | Status: AC | PRN

## 2023-11-08 MED ORDER — HALOPERIDOL LACTATE 2 MG/ML PO CONC: 0.5000 mg | ORAL | Status: AC | PRN

## 2023-11-08 MED ORDER — HALOPERIDOL LACTATE 5 MG/ML IJ SOLN: 0.5000 mg | INTRAMUSCULAR | Status: AC | PRN

## 2023-11-08 MED ORDER — GLYCOPYRROLATE 1 MG PO TABS: 1.0000 mg | ORAL_TABLET | ORAL | Status: AC | PRN

## 2023-11-08 MED ORDER — HALOPERIDOL 0.5 MG PO TABS: 0.5000 mg | ORAL_TABLET | ORAL | Status: AC | PRN

## 2023-11-08 NOTE — Telephone Encounter (Signed)
 Patient is no longer receiving therapy at this time and is being evaluated for hospice care. Will sign off at this time.   Remberto Lienhard, PharmD Hematology/Oncology Clinical Pharmacist [facility] [phone]

## 2023-11-08 NOTE — Plan of Care (Signed)
  Problem: Education: Goal: Knowledge of General Education information will improve Description: Including pain rating scale, medication(s)/side effects and non-pharmacologic comfort measures Outcome: Progressing   Problem: Clinical Measurements: Goal: Ability to maintain clinical measurements within normal limits will improve Outcome: Progressing   Problem: Activity: Goal: Risk for activity intolerance will decrease Outcome: Progressing   Problem: Nutrition: Goal: Adequate nutrition will be maintained Outcome: Progressing   Problem: Elimination: Goal: Will not experience complications related to bowel motility Outcome: Progressing   Problem: Pain Managment: Goal: General experience of comfort will improve and/or be controlled Outcome: Progressing   Problem: Safety: Goal: Ability to remain free from injury will improve Outcome: Progressing   Problem: Skin Integrity: Goal: Risk for impaired skin integrity will decrease Outcome: Progressing

## 2023-11-08 NOTE — Progress Notes (Signed)
 Daily Progress Note   Patient Name: Tyler Scott       Date: 11/08/2023 DOB: [birth year]  Age: 68 y.o. MRN#: [id number] Attending Physician: Verdene Purchase, MD Primary Care Physician: [facility] Admit Date: 11/01/2023 Length of Stay: 6 days  Reason for Consultation/Follow-up: Establishing goals of care and symptom management  Subjective:   CC: Patient agreeing with inpatient hospice evaluation.  Following up regarding complex medical decision making and symptom management.  Subjective:  Reviewed EMR including recent documentation from hospitalist.  At time of EMR review in past 24 hours patient has received as needed IV Dilaudid  1 mg x 5 doses.   Discussed care with [facility], hospitalist, RN, and [facility] liaison.  Patient's wife had specifically requested evaluation for inpatient hospice at [facility].  [facility] to evaluate today.  Presented to bedside to see patient.  Patient laying in bed and [facility] liaison present at bedside.  Patient wanting to discuss underlying disease again.  Again spent time explaining patient has underlying CLL that is believed to have progressed to aggressive lymphoma which is causing patient's bone marrow to fail.  Patient also has left iliopsoas abscess and pressure ulcer due to debility.  Discussed patient not appropriate for rehab due to underlying medical comorbidities.  Explained at this time have been focusing on patient's comfort knowing that he has a disease that cannot be cured.  Discussed that patient's wife cannot take care of him at home with hospice support.  Have been considering long-term care placement with hospice support versus inpatient hospice referral.  Patient still needing adjustments of IV pain medications at this time and bone marrow is still failing so pancytopenic.  Wife had requested evaluation for [facility] and patient had agreed.  Spent time answering questions patient had at that time.  Patient agreeing with referral to [facility] for end-of-life care.  All questions answered at that time.  Updated later in day that patient has been accepted to inpatient hospice at [facility] and will be transferred as soon as able.  Discussed with hospitalist, RN, TOC, and [facility] liaison to coordinate care.  Also discussed care with oncologist for updates.  Objective:   Vital Signs:  BP (!) 114/48 (BP Location: Left Arm)   Pulse (!) 104   Temp 97.6 F (36.4 C) (Oral)   Resp 16   Ht 5' 8 (1.727 m)   Wt 58.5 kg   SpO2 97%   BMI 19.61 kg/m   Physical Exam: General: NAD, awake though much more tired today, cachectic, frail, chronically ill-appearing Cardiovascular: Tachycardia noted, edema in feet bilaterally Respiratory: no increased work of breathing noted, not in respiratory distress Neuro: Awake though much more tired today  Assessment & Plan:   Assessment: Patient is a 68 year old male with a past medical history of diabetes mellitus type 2, hypertension, CLL, ITP, pancytopenia, hypertension, and hyperlipidemia who was admitted on 11/01/2023 from skilled nursing facility for management of weakness and abnormal lab work.  Since admission, patient has received management for symptomatic anemia, pancytopenia, new left iliopsoas abscess, urinary retention, new sacral pressure ulcer developed at SNF reported POA, and failure to thrive.  Palliative medicine team consulted to assist with complex medical decision making. Of note patient known to PMT team from prior admissions.  Recommendations/Plan: # Complex medical decision making/goals of care:  - Discussed care patient as detailed above in HPI.  Already had extensive conversation with patient and wife on 11/06/2023 and patient was transition to comfort focused care  at that time.  Have already discussed involvement of hospice support.  Wife unable to care for patient 24/7 at home.  Patient evaluated for inpatient hospice today at [facility] and has been accepted.  TOC  assisting with transfer transfer at this time.  Palliative medicine team continuing to follow with patient's medical journey.                - Have already discontinued interventions that are no longer focused on comfort such as IV fluids, imaging, or lab work.  Focusing on symptom management of pain, dyspnea, and agitation in the setting of end-of-life care.                  Code Status: Do not attempt resuscitation (DNR) - Comfort care   # Symptom management Patient is receiving these palliative interventions for symptom management with an intent to improve quality of life.                   -Pain/Dyspnea, acute in the setting of end-of-life care                Patient was not on medications for pain previously.                               - Continue IV Dilaudid  to 0.5-1 mg every 1 hour as needed.  Continue to adjust based on patient's symptom burden.  If patient needing frequent dosing, may need to consider continuous infusion.                               - Continue gabapentin  100 mg 3 times daily                               - Continue oral Dilaudid  tablet 2 mg nightly                  -Anxiety/agitation, in the setting of end-of-life care                               - Continue Haldol as needed. Continue to adjust based on patient's symptom burden.                   -Secretions, in the setting of end-of-life care                               - Continue glycopyrrolate as needed.   # Psycho-social/Spiritual Support:  - Support System: Wife  # Discharge Planning: Hospice facility  Thank you for allowing the palliative care team to participate in the care Tyler Scott.  Tinnie Radar, DO Palliative Care Provider PMT # [phone]  If patient remains symptomatic despite maximum doses, please call PMT at [phone] between 0700 and 1900. Outside of these hours, please call attending, as PMT does not have night coverage.  Personally spent 50 minutes in patient care  including extensive chart review (labs, imaging, progress/consult notes, vital signs), medically appropraite exam, discussed with treatment team, education to patient, family, and staff, documenting clinical information, medication review and management, coordination of care, and available advanced directive documents.

## 2023-11-08 NOTE — TOC Transition Note (Signed)
 Transition of Care [facility]) - Discharge Note   Patient Details  Name: Tyler Scott MRN: [id number] Date of Birth: 1955-05-03  Transition of Care [facility]) CM/SW Contact:  Doneta Glenys DASEN, RN Phone Number: 11/08/2023, 6:16 PM   Clinical Narrative:    Patient discharge to [facility] inpatient hospice. Nurse called report. Discharge packet includes facesheet,medical necessity, discharge summary and signed DNR with shadow chart. [facility] called for transport.   Final next level of care: Hospice Medical Facility [facility]) Barriers to Discharge: Barriers Resolved   Patient Goals and CMS Choice Patient states their goals for this hospitalization and ongoing recovery are:: Manage symptoms to feel better. CMS Medicare.gov Compare Post Acute Care list provided to:: Patient ([name] wife) Choice offered to / list presented to : Patient, Spouse [place] ownership interest in [facility] [employer] [employer]provided to:: Parent NA    Discharge Placement              Patient chooses bed at:  [facility]) Patient to be transferred to facility by: [facility] Name of family member notified: [name] Patient and family notified of of transfer: 11/08/23  Discharge Plan and Services Additional resources added to the After Visit Summary for   In-house Referral: NA Discharge Planning Services: CM Consult            DME Arranged: N/A DME Agency: NA       HH Arranged: NA HH Agency: NA        Social Drivers of Health (SDOH) Interventions SDOH Screenings   Food Insecurity: No Food Insecurity (11/01/2023)  Housing: Low Risk  (11/01/2023)  Transportation Needs: No Transportation Needs (11/01/2023)  Utilities: Not At Risk (11/01/2023)  Depression (PHQ2-9): Low Risk  (09/14/2023)  Social Connections: Moderately Isolated (11/01/2023)  Tobacco Use: Low Risk  (11/02/2023)     Readmission Risk Interventions    10/23/2023    2:09 PM 10/11/2023    2:19 PM 08/30/2023   11:40 AM  Readmission Risk  Prevention Plan  Transportation Screening Complete Complete Complete  PCP or Specialist Appt within 3-5 Days   Complete  HRI or Home Care Consult   Complete  Social Work Consult for Recovery Care Planning/Counseling   Complete  Palliative Care Screening   Complete  Medication Review Oceanographer) Complete Complete Complete  PCP or Specialist appointment within 3-5 days of discharge Complete Complete   HRI or Home Care Consult  Complete   SW Recovery Care/Counseling Consult Complete Complete   Palliative Care Screening Not Applicable Not Applicable   Skilled Nursing Facility Complete Not Applicable

## 2023-11-08 NOTE — Discharge Summary (Signed)
 [facility]  Physician Discharge Summary   Patient ID: Tyler Scott MRN: [id number] DOB/AGE: Feb 28, 1955 68 y.o.  Admit date: 11/01/2023 Discharge date:   11/08/2023   PCP: [facility]  DISCHARGE DIAGNOSES:    Symptomatic anemia   CLL (chronic lymphocytic leukemia) (HCC)   Essential hypertension   Hyperlipidemia   Chronic ITP (idiopathic thrombocytopenia) (HCC)   Hyperkalemia   Insulin  dependent type 2 diabetes mellitus (HCC)   Cancer associated pain   Pressure injury of skin   Protein-calorie malnutrition, severe  RECOMMENDATIONS FOR OUTPATIENT FOLLOW UP: Plan is for transition to residential hospice   Home Health: None Equipment/Devices: None  CODE STATUS: DNR  DISCHARGE CONDITION: fair  Diet recommendation: Comfort feeds as tolerated  INITIAL HISTORY: 68 year old male with history of insulin -dependent type type II, hypertension, CLL, ITP, pancytopenia, hypertension, hyperlipidemia who presented to the emergency department from [facility] for evaluation of weakness, abnormal labs. On presentation ,hemoglobin was 5.2, platelets of 76, creatinine of 1.3. He was hemodynamically stable on presentation. 2 units of blood transfusion ordered. Oncology is following. Also found to have sacral ulcer, general surgery consulted, PT doing hydrotherapy. Abdomen CT/pelvis done on 9/25 showed left iliopsoas abscess. General surgery/IR declined any kind of intervention saying it is not amenable for drainage. Currently on broad-spectrum antibiotics . Palliative care also consulted for goals of care   HOSPITAL COURSE:   Symptomatic anemia: Has history of chronic pancytopenia secondary to CLL.  Hemoglobin was in the range of 5 on presentation.  It appears that patient was transfused about 4 units of PRBC. Now transitioned to comfort care/hospice.    CLL/pancytopenia:  History of CLL, was on different therapies although he had had several no-shows according to oncology  notes.  Follows with Dr. Onesimo.  Oncology suspecting transformation of CLL to aggressive lymphoma.  Suspicion of bone marrow involvement.   Has chronic thrombocytopenia, suspected to be from chronic ITP.   CT imaging also showed worsening of lytic mets, worsening of right iliac bone lytic lesions and 6 X 4.3 cm soft tissue mass that erodes into right sacral ala.   Left iliopsoas abscess:  As seen on CT.  Discussed with general surgery.  General surgery says it is out of the scope of their practice.  IR consulted.  IR mentioned that the collection is not amenable for drainage.  Recommended treatment with antibiotics.   Patient was treated with broad-spectrum antibiotics with vancomycin  cefepime  and metronidazole .  Now transitioned to comfort.  Antibiotics have been discontinued.   Urinary retention Found to have urinary distention with mild hydronephrosis on CT scan.  Foley placed with removal of 1 L of urine.   Continue Flomax.  Plan is for transition to hospice.  Will leave Foley catheter in for comfort for now.     Acute kidney injury/metabolic acidosis Was given IV fluids.  Labs were stable when last checked.      Hypertension:  Soft blood pressures were noted.  Metoprolol  was discontinued.  Blood pressures appear to have rebounded.      Insulin -dependent diabetes type 2:    Diarrhea: Stool studies were ordered however diarrhea stopped before his stool sample could be sent.  Abdominal x-ray did not show any acute findings.     Failure to thrive/generalized weakness:  Patient is cachectic and severely malnourished.  Has poor appetite.   Left lower extremity edema: Negative venous Doppler   Sacral pressure ulcer: [facility] developed [facility] at [facility].  Wound care nurse saw him here and  recommended general surgery consultation for possible debridement . General surgery consulted.  General surgery recommended hydrotherapy and signed off.  PT following for hydrotherapy.  Now comfort care.    Goals of care: Palliative care has been consulted to go over goals of care with patient and family. They met with patient and his wife yesterday.  Changed over to DNR.  Plan is for transition to hospice.  Disposition is unclear.   Severe protein calorie malnutrition Nutrition Problem: Severe Malnutrition Etiology: chronic illness, cancer and cancer related treatments   Patient is stable.  Plan is for transition to residential hospice.  Okay for discharge to residential hospice when bed is available.  PERTINENT LABS:  The results of significant diagnostics from this hospitalization (including imaging, microbiology, ancillary and laboratory) are listed below for reference.    Microbiology: Recent Results (from the past 240 hours)  Culture, blood (Routine X 2) w Reflex to ID Panel     Status: None (Preliminary result)   Collection Time: 11/05/23  9:50 AM   Specimen: BLOOD RIGHT ARM  Result Value Ref Range Status   Specimen Description   Final    BLOOD RIGHT ARM Performed at [facility], [address] [place], [place] [address]    Special Requests   Final    BOTTLES DRAWN AEROBIC AND ANAEROBIC Blood Culture results may not be optimal due to an inadequate volume of blood received in culture bottles Performed at [facility], [address] [place], [place] [address]    Culture   Final    NO GROWTH 2 DAYS Performed at [facility], [address], [place], [place] [address]    Report Status PENDING  Incomplete  Culture, blood (Routine X 2) w Reflex to ID Panel     Status: None (Preliminary result)   Collection Time: 11/05/23  9:50 AM   Specimen: BLOOD LEFT ARM  Result Value Ref Range Status   Specimen Description   Final    BLOOD LEFT ARM Performed at [facility], [address], [place], [place] [address]    Special Requests   Final    BOTTLES DRAWN AEROBIC AND ANAEROBIC Blood Culture results may not be optimal due to an inadequate  volume of blood received in culture bottles Performed at [facility], [address], [place], [place] [address]    Culture   Final    NO GROWTH 2 DAYS Performed at [facility], [address], [place], [place] [address]    Report Status PENDING  Incomplete     Labs:   Basic Metabolic Panel: Recent Labs  Lab 11/01/23 1544 11/02/23 0607 11/03/23 0602 11/05/23 0531 11/06/23 0836  NA 134* 135 135 134* 136  K 5.7* 5.0 4.4 4.3 3.5  CL 103 104 103 104 103  CO2 17* 17* 19* 16* 18*  GLUCOSE 359* 319* 185* 157* 157*  BUN 62* 56* 49* 73* 62*  CREATININE 1.34* 1.11 1.06 1.39* 1.45*  CALCIUM  8.2* 7.9* 8.0* 8.0* 7.8*   Liver Function Tests: Recent Labs  Lab 11/01/23 1544 11/02/23 0607  AST 23 18  ALT 18 14  ALKPHOS 77 69  BILITOT 0.9 1.1  PROT 5.3* 4.7*  ALBUMIN 3.3* 3.0*    CBC: Recent Labs  Lab 11/02/23 0607 11/03/23 0602 11/04/23 0620 11/04/23 2326 11/05/23 0531 11/06/23 0836  WBC 7.1 5.2 4.9  --  5.6 3.6*  HGB 7.7* 8.0* 7.1* 10.0* 9.3* 7.5*  HCT 23.6* 25.2* 22.6* 29.7* 28.8* 23.6*  MCV 90.4 91.6 91.9  --  89.4 92.2  PLT 82* 82* 73*  --  72* 60*     CBG: Recent Labs  Lab 11/05/23 1207 11/05/23 1635 11/05/23 2024 11/06/23 0728 11/06/23 1202  GLUCAP 142* 167* 193* 145* 154*     IMAGING STUDIES CT ABDOMEN PELVIS W CONTRAST Result Date: 11/04/2023 CLINICAL DATA:  Abdominal pain, acute, nonlocalized EXAM: CT ABDOMEN AND PELVIS WITH CONTRAST TECHNIQUE: Multidetector CT imaging of the abdomen and pelvis was performed using the standard protocol following bolus administration of intravenous contrast. RADIATION DOSE REDUCTION: This exam was performed according to the departmental dose-optimization program which includes automated exposure control, adjustment of the mA and/or kV according to patient size and/or use of iterative reconstruction technique. CONTRAST:  OMNIPAQUE  IOHEXOL  300 MG/ML  SOLN COMPARISON:  Chest x-ray  10/07/2023, CT abdomen pelvis 10/07/2023 FINDINGS: Lower chest: Small bilateral, left greater than right, pleural effusions. New on the right and grossly stable on the left. Hepatobiliary: No focal liver abnormality. No gallstones, gallbladder wall thickening, or pericholecystic fluid. No biliary dilatation. Pancreas: No focal lesion. Normal pancreatic contour. No surrounding inflammatory changes. No main pancreatic ductal dilatation. Spleen: Spleen is enlarged in caliber measuring up to 14 cm. Similar-appearing small splenic infarct. No focal lesion. Adrenals/Urinary Tract: No adrenal nodule bilaterally. Bilateral kidneys enhance symmetrically. The urinary bladder lumen is dilated with fluid with associated bilateral at least mild hydronephrosis. No nephroureterolithiasis. On delayed imaging, there is no urothelial wall thickening and there are no filling defects in the opacified portions of the bilateral collecting systems or ureters. Minimal excretion of intravenous contrast noted bilaterally. Stomach/Bowel: Stomach is within normal limits. No evidence of bowel wall thickening or dilatation. The appendix is not definitely identified with no inflammatory changes in the right lower quadrant to suggest acute appendicitis. Vascular/Lymphatic: No abdominal aorta or iliac aneurysm. Mild atherosclerotic plaque of the aorta and its branches. Interval decrease in size of bulky and conglomerative retroperitoneal, pelvic, inguinal lymphadenopathy with as an example a 4.4 cm left periaortic lymph node (from 6 cm) (2:31) as well as a 2.5 cm left cloquet lymph node (from 3.3 cm) (2:64). Reproductive: No mass. Other: Interval development of a poorly visualized distal left iliopsoas abscess formation measuring 2 x 1 x 6 cm (2:74, 7: 42). No intraperitoneal free fluid. No intraperitoneal free gas. No organized fluid collection. Musculoskeletal: Diffuse subcutaneus soft tissue edema. Interval worsening of a right iliac bone lytic  lesion with associated 6 x 4.3 cm soft tissue mass (2:56). Mass erodes into the right sacral ala. Interval worsening of scattered lytic lesions of the vertebral bodies and sacral vertebral bodies. No acute displaced fracture. IMPRESSION: 1. Interval development of a 2 x 1 x 6 cm poorly visualized distal left iliopsoas abscess. 2. Interval development of a small right and grossly stable small left pleural effusions. 3. Urinary bladder lumen is dilated with fluid with associated bilateral at least mild hydronephrosis. This may reflect changes of obstructive uropathy or reflux. 4. Interval decrease in size of bulky and conglomerative retroperitoneal, pelvic, inguinal lymphadenopathy. 5. Interval worsening of axial and appendicular lytic metastases. Interval worsening of a right iliac bone lytic lesion with associated 6 x 4.3 cm soft tissue mass that erodes into the right sacral ala. 6.  Aortic Atherosclerosis (ICD10-I70.0). Electronically Signed   By: Morgane  Naveau M.D.   On: 11/04/2023 22:14   VAS US  LOWER EXTREMITY VENOUS (DVT) Result Date: 11/04/2023  Lower Venous  DVT Study Patient Name:  NICHOLAUS STEINKE  Date of Exam:   11/04/2023 Medical Rec #: [id number]            Accession #:    [id number] Date of Birth: 01-29-1956           Patient Gender: M Patient Age:   87 years Exam Location:  [facility] Procedure:      VAS US  LOWER EXTREMITY VENOUS (DVT) Referring Phys: AMRIT ADHIKARI --------------------------------------------------------------------------------  Indications: Edema (LLE).  Comparison Study: No previous exams Performing Technologist: Jody Hill RVT, RDMS  Examination Guidelines: A complete evaluation includes B-mode imaging, spectral Doppler, color Doppler, and power Doppler as needed of all accessible portions of each vessel. Bilateral testing is considered an integral part of a complete examination. Limited examinations for reoccurring indications may be performed as noted. The reflux  portion of the exam is performed with the patient in reverse Trendelenburg.  +---------+---------------+---------+-----------+----------+--------------+ RIGHT    CompressibilityPhasicitySpontaneityPropertiesThrombus Aging +---------+---------------+---------+-----------+----------+--------------+ CFV      Full           Yes      Yes                                 +---------+---------------+---------+-----------+----------+--------------+ SFJ      Full                                                        +---------+---------------+---------+-----------+----------+--------------+ FV Prox  Full           Yes      Yes                                 +---------+---------------+---------+-----------+----------+--------------+ FV Mid   Full           Yes      Yes                                 +---------+---------------+---------+-----------+----------+--------------+ FV DistalFull           Yes      Yes                                 +---------+---------------+---------+-----------+----------+--------------+ PFV      Full                                                        +---------+---------------+---------+-----------+----------+--------------+ POP      Full           Yes      Yes                                 +---------+---------------+---------+-----------+----------+--------------+ PTV      Full                                                        +---------+---------------+---------+-----------+----------+--------------+  PERO     Full                                                        +---------+---------------+---------+-----------+----------+--------------+   +---------+---------------+---------+-----------+----------+--------------+ LEFT     CompressibilityPhasicitySpontaneityPropertiesThrombus Aging +---------+---------------+---------+-----------+----------+--------------+ CFV      Full           Yes      Yes                                  +---------+---------------+---------+-----------+----------+--------------+ SFJ      Full                                                        +---------+---------------+---------+-----------+----------+--------------+ FV Prox  Full           Yes      Yes                                 +---------+---------------+---------+-----------+----------+--------------+ FV Mid   Full           Yes      Yes                                 +---------+---------------+---------+-----------+----------+--------------+ FV DistalFull           Yes      Yes                                 +---------+---------------+---------+-----------+----------+--------------+ PFV      Full                                                        +---------+---------------+---------+-----------+----------+--------------+ POP      Full           Yes      Yes                                 +---------+---------------+---------+-----------+----------+--------------+ PTV      Full                                                        +---------+---------------+---------+-----------+----------+--------------+ PERO     Full                                                        +---------+---------------+---------+-----------+----------+--------------+  Summary: BILATERAL: - No evidence of deep vein thrombosis seen in the lower extremities, bilaterally. - RIGHT: - No cystic structure found in the popliteal fossa.  LEFT: - Ultrasound characteristics of a ruptured Baker's Cyst are noted.  *See table(s) above for measurements and observations. Electronically signed by Gaile New MD on 11/04/2023 at 8:26:24 PM.    Final    DG Abd 1 View Result Date: 11/03/2023 CLINICAL DATA:  Abdominal distension.  History of CLL. EXAM: ABDOMEN - 1 VIEW COMPARISON:  08/27/2023 FINDINGS: Bowel gas pattern is nonobstructive with a few air-filled nondilated small bowel loops over the  right abdomen. No free peritoneal air. Mild degenerative change of the spine and hips. IMPRESSION: Nonobstructive bowel gas pattern. Electronically Signed   By: Toribio Agreste M.D.   On: 11/03/2023 12:05    DISCHARGE EXAMINATION: Vitals:   11/06/23 0036 11/06/23 1332 11/06/23 1413 11/08/23 0403  BP: 130/64 (!) 113/56  (!) 114/48  Pulse: (!) 105 95  (!) 104  Resp: 16 18  16   Temp: 99.7 F (37.6 C)  98.4 F (36.9 C) 97.6 F (36.4 C)  TempSrc: Oral  Oral Oral  SpO2: 98% 100%  97%  Weight:      Height:       Awake alert.  No discomfort. Distracted.   DISPOSITION: Residential hospice      Allergies as of 11/08/2023   No Known Allergies      Medication List     STOP taking these medications    acyclovir  400 MG tablet Commonly known as: ZOVIRAX    Cholecalciferol 50 MCG (2000 UT) Tabs   dexamethasone  4 MG tablet Commonly known as: DECADRON    HumaLOG KwikPen 100 UNIT/ML KwikPen Generic drug: insulin  lispro   HYDROmorphone  2 MG tablet Commonly known as: DILAUDID  Replaced by: HYDROmorphone  1 MG/ML injection   insulin  aspart 100 UNIT/ML injection Commonly known as: novoLOG    insulin  glargine 100 UNIT/ML injection Commonly known as: LANTUS    Lokelma  10 g Pack packet Generic drug: sodium zirconium cyclosilicate    metoprolol  tartrate 25 MG tablet Commonly known as: LOPRESSOR    ondansetron  8 MG tablet Commonly known as: Zofran    ONE TOUCH ULTRA TEST test strip Generic drug: glucose blood   OneTouch Delica Lancets 33G Misc   simvastatin  20 MG tablet Commonly known as: ZOCOR    traMADol  50 MG tablet Commonly known as: ULTRAM    venetoclax  10 & 50 & 100 MG Starter Pack Commonly known as: VENCLEXTA        TAKE these medications    allopurinol  300 MG tablet Commonly known as: ZYLOPRIM  Take 150 mg by mouth daily.   artificial tears ophthalmic solution Place 1 drop into both eyes in the morning.   doxepin  25 MG capsule Commonly known as:  SINEQUAN  Take 25 mg by mouth at bedtime.   gabapentin  100 MG capsule Commonly known as: NEURONTIN  Take 1 capsule (100 mg total) by mouth 3 (three) times daily.   glycopyrrolate 1 MG tablet Commonly known as: ROBINUL Take 1 tablet (1 mg total) by mouth every 4 (four) hours as needed (excessive secretions).   glycopyrrolate 0.2 MG/ML injection Commonly known as: ROBINUL Inject 1 mL (0.2 mg total) into the skin every 4 (four) hours as needed (excessive secretions).   glycopyrrolate 0.2 MG/ML injection Commonly known as: ROBINUL Inject 1 mL (0.2 mg total) into the vein every 4 (four) hours as needed (excessive secretions).   haloperidol 0.5 MG tablet Commonly known as: HALDOL Take 1 tablet (0.5 mg total) by  mouth every 4 (four) hours as needed for agitation (or delirium).   haloperidol 2 MG/ML solution Commonly known as: HALDOL Place 0.3 mLs (0.6 mg total) under the tongue every 4 (four) hours as needed for agitation (or delirium).   haloperidol lactate 5 MG/ML injection Commonly known as: HALDOL Inject 0.1 mLs (0.5 mg total) into the vein every 4 (four) hours as needed (or delirium).   HYDROmorphone  1 MG/ML injection Commonly known as: DILAUDID  Inject 0.5-1 mLs (0.5-1 mg total) into the vein every hour as needed for severe pain (pain score 7-10) or moderate pain (pain score 4-6). Replaces: HYDROmorphone  2 MG tablet   methocarbamol  500 MG tablet Commonly known as: ROBAXIN  Take 1 tablet (500 mg total) by mouth at bedtime.   pantoprazole  40 MG tablet Commonly known as: PROTONIX  Take 1 tablet (40 mg total) by mouth at bedtime.   polyethylene glycol powder 17 GM/SCOOP powder Commonly known as: GLYCOLAX /MIRALAX  Take 17 g dissolved in liquid by mouth daily.   senna-docusate 8.6-50 MG tablet Commonly known as: Senokot-S Take 1 tablet by mouth 2 (two) times daily.   sertraline  25 MG tablet Commonly known as: ZOLOFT  Take 1 tablet (25 mg total) by mouth daily.            TOTAL DISCHARGE TIME: 35 minutes  Kensy Blizard [employer] on [email or web address]  11/08/2023, 10:53 AM

## 2023-11-08 NOTE — Progress Notes (Signed)
 [id number] Liaison Note  Received request from [facility] manager for family interest in [facility]. Eligibility confirmed. Met with patient and spoke with wife to confirm interest and explain services.Patient and wife agreeable to transfer today. TOC aware. RN please call report to [phone] prior to patient leaving the unit. Please send signed DNR with patient at discharge.   Thank you for allowing us  to participate in this patient's care.  Eleanor Nail, LPN [facility] Liaison [phone]

## 2023-11-08 NOTE — TOC Progression Note (Addendum)
 Transition of Care [facility]) - Progression Note    Patient Details  Name: Tyler Scott MRN: [id number] Date of Birth: Jun 29, 1955  [facility] CM/SW Contact  Doneta Glenys DASEN, RN Phone Number: 11/08/2023, 9:34 AM  Clinical Narrative:    CM left message with Powell Colt [phone] [facility] social worker. CM spoke with patient an agreeable to inpatient hospice with [facility].   Expected Discharge Plan: Skilled Nursing Facility Barriers to Discharge: Continued Medical Work up               Expected Discharge Plan and Services In-house Referral: NA Discharge Planning Services: CM Consult   Living arrangements for the past 2 months: Skilled Nursing Facility                 DME Arranged: N/A DME Agency: NA       HH Arranged: NA HH Agency: NA         Social Drivers of Health (SDOH) Interventions SDOH Screenings   Food Insecurity: No Food Insecurity (11/01/2023)  Housing: Low Risk  (11/01/2023)  Transportation Needs: No Transportation Needs (11/01/2023)  Utilities: Not At Risk (11/01/2023)  Depression (PHQ2-9): Low Risk  (09/14/2023)  Social Connections: Moderately Isolated (11/01/2023)  Tobacco Use: Low Risk  (11/02/2023)    Readmission Risk Interventions    10/23/2023    2:09 PM 10/11/2023    2:19 PM 08/30/2023   11:40 AM  Readmission Risk Prevention Plan  Transportation Screening Complete Complete Complete  PCP or Specialist Appt within 3-5 Days   Complete  HRI or Home Care Consult   Complete  Social Work Consult for Recovery Care Planning/Counseling   Complete  Palliative Care Screening   Complete  Medication Review [employer]) Complete Complete Complete  PCP or Specialist appointment within 3-5 days of discharge Complete Complete   HRI or Home Care Consult  Complete   SW Recovery Care/Counseling Consult Complete Complete   Palliative Care Screening Not Applicable Not Applicable   Skilled Nursing Facility Complete Not Applicable

## 2023-11-08 NOTE — Progress Notes (Cosign Needed)
 Tyler Scott   DOB:[birth year]   FM#:[id number]      ASSESSMENT & PLAN:  Tyler Scott is a 68 year old male patient with oncologic history significant for CLL.  Patient has had multiple hospital admissions.  He was admitted 11/01/23 for severe anemia and failure to thrive.  Follows with Medical Oncology/Dr. Onesimo.   Severe Anemia, symptomatic -- Likely multifactorial secondary to oral chemo, malignancy, poor nutrition -- Hemoglobin 5.2 on admission 9/22.  Status post PRBC transfusions this admission. Last hemoglobin 7.5 on 9/27. -- Patient made DNR-comfort care, no need to repeat labs.   Thrombocytopenia -- Platelets 60K on 9/27 -- Has been on weekly Nplate  4 mcg/kg.   -- Patient made DNR-comfort care, no need to check labs   CLL/SLL/Low grade Lymphoma -Diagnosed 2012 - Status post multiple lines of therapy including Bendamustine , rituximab , ibrutinib , Calquence .  Received Bendamustine  without rituximab  09/08/2023. - Previously with many interrupted cycles of therapy due to no-shows. - Most recently on venetoclax  + Rituxan . - Patient was admitted to SNF after last hospitalization.  - LDH and uric acid, no evidence of tumor lysis -- Patient is now DNR-comfort care. Pending transfer to residential hospice facility.  - Medical oncology/Dr. Onesimo following closely   Failure to thrive Weight loss/poor appetite - Appears weak and cachectic -- Seen by Dietician this admission - Continue supportive care   Lower extremity weakness  Impaired mobility - Edema 1+ of both feet - Patient is not ambulating at this time - Supportive care   Sacral pressure ulcer  - Likely developed at SNF - Continue supportive care     Code Status DNR-Comfort  Subjective:  Patient seen awake and laying in bed.  Very ill-appearing and cachectic.  States he is okay and has no complaints.  Pending discharge to residential hospice.   Objective:   Intake/Output Summary (Last 24 hours) at  11/08/2023 1323 Last data filed at 11/08/2023 0418 Gross per 24 hour  Intake --  Output 650 ml  Net -650 ml     PHYSICAL EXAMINATION: ECOG PERFORMANCE STATUS: 4 - Bedbound  Vitals:   11/06/23 1413 11/08/23 0403  BP:  (!) 114/48  Pulse:  (!) 104  Resp:  16  Temp: 98.4 F (36.9 C) 97.6 F (36.4 C)  SpO2:  97%   Filed Weights   11/01/23 1526  Weight: 129 lb (58.5 kg)    GENERAL: alert, +chronically ill appearing +cachectic SKIN: +pale skin color, +multiple ecchymoses UE, +sacral decubitus ulcer  EYES: normal, conjunctiva are pink and non-injected, sclera clear OROPHARYNX: no exudate, no erythema and lips, buccal mucosa, and tongue normal  NECK: supple, thyroid  normal size, non-tender, without nodularity LYMPH: no palpable lymphadenopathy in the cervical, axillary or inguinal LUNGS: clear to auscultation and percussion with normal breathing effort HEART: regular rate & rhythm and no murmurs and no lower extremity edema ABDOMEN: abdomen soft, non-tender and normal bowel sounds MUSCULOSKELETAL: +impaired mobility PSYCH: alert & oriented x 3 with fluent speech NEURO: no focal motor/sensory deficits   All questions were answered. The patient knows to call the clinic with any problems, questions or concerns.   The total time spent in the appointment was 40 minutes encounter with patient including review of chart and various tests results, discussions about plan of care and coordination of care plan  [name], NP 11/08/2023 1:23 PM    Labs Reviewed:  Lab Results  Component Value Date   WBC 3.6 (L) 11/06/2023   HGB 7.5 (L)  11/06/2023   HCT 23.6 (L) 11/06/2023   MCV 92.2 11/06/2023   PLT 60 (L) 11/06/2023   Recent Labs    10/07/23 1539 10/07/23 2151 10/17/23 0629 10/19/23 0539 11/01/23 1544 11/02/23 0607 11/03/23 0602 11/05/23 0531 11/06/23 0836  NA SPECIMEN DILUTED   < > 137   < > 134* 135 135 134* 136  K SPECIMEN DILUTED   < > 4.2   < > 5.7* 5.0 4.4 4.3  3.5  CL SPECIMEN DILUTED   < > 104   < > 103 104 103 104 103  CO2 SPECIMEN DILUTED   < > 22   < > 17* 17* 19* 16* 18*  GLUCOSE SPECIMEN DILUTED   < > 96   < > 359* 319* 185* 157* 157*  BUN SPECIMEN DILUTED   < > 10   < > 62* 56* 49* 73* 62*  CREATININE SPECIMEN DILUTED   < > 0.73   < > 1.34* 1.11 1.06 1.39* 1.45*  CALCIUM  SPECIMEN DILUTED   < > 8.0*   < > 8.2* 7.9* 8.0* 8.0* 7.8*  GFRNONAA SPECIMEN DILUTED   < > >60   < > 58* >60 >60 56* 53*  GFRAA SPECIMEN DILUTED  --   --   --   --   --   --   --   --   PROT SPECIMEN DILUTED   < > 4.6*  --  5.3* 4.7*  --   --   --   ALBUMIN SPECIMEN DILUTED   < > 3.1*  --  3.3* 3.0*  --   --   --   AST SPECIMEN DILUTED   < > 28  --  23 18  --   --   --   ALT SPECIMEN DILUTED   < > 12  --  18 14  --   --   --   ALKPHOS SPECIMEN DILUTED   < > 87  --  77 69  --   --   --   BILITOT SPECIMEN DILUTED   < > 1.1  --  0.9 1.1  --   --   --    < > = values in this interval not displayed.    Studies Reviewed:  CT ABDOMEN PELVIS W CONTRAST Result Date: 11/04/2023 CLINICAL DATA:  Abdominal pain, acute, nonlocalized EXAM: CT ABDOMEN AND PELVIS WITH CONTRAST TECHNIQUE: Multidetector CT imaging of the abdomen and pelvis was performed using the standard protocol following bolus administration of intravenous contrast. RADIATION DOSE REDUCTION: This exam was performed according to the departmental dose-optimization program which includes automated exposure control, adjustment of the mA and/or kV according to patient size and/or use of iterative reconstruction technique. CONTRAST:  OMNIPAQUE  IOHEXOL  300 MG/ML  SOLN COMPARISON:  Chest x-ray 10/07/2023, CT abdomen pelvis 10/07/2023 FINDINGS: Lower chest: Small bilateral, left greater than right, pleural effusions. New on the right and grossly stable on the left. Hepatobiliary: No focal liver abnormality. No gallstones, gallbladder wall thickening, or pericholecystic fluid. No biliary dilatation. Pancreas: No focal lesion.  Normal pancreatic contour. No surrounding inflammatory changes. No main pancreatic ductal dilatation. Spleen: Spleen is enlarged in caliber measuring up to 14 cm. Similar-appearing small splenic infarct. No focal lesion. Adrenals/Urinary Tract: No adrenal nodule bilaterally. Bilateral kidneys enhance symmetrically. The urinary bladder lumen is dilated with fluid with associated bilateral at least mild hydronephrosis. No nephroureterolithiasis. On delayed imaging, there is no urothelial wall thickening and there are no filling defects in the  opacified portions of the bilateral collecting systems or ureters. Minimal excretion of intravenous contrast noted bilaterally. Stomach/Bowel: Stomach is within normal limits. No evidence of bowel wall thickening or dilatation. The appendix is not definitely identified with no inflammatory changes in the right lower quadrant to suggest acute appendicitis. Vascular/Lymphatic: No abdominal aorta or iliac aneurysm. Mild atherosclerotic plaque of the aorta and its branches. Interval decrease in size of bulky and conglomerative retroperitoneal, pelvic, inguinal lymphadenopathy with as an example a 4.4 cm left periaortic lymph node (from 6 cm) (2:31) as well as a 2.5 cm left cloquet lymph node (from 3.3 cm) (2:64). Reproductive: No mass. Other: Interval development of a poorly visualized distal left iliopsoas abscess formation measuring 2 x 1 x 6 cm (2:74, 7: 42). No intraperitoneal free fluid. No intraperitoneal free gas. No organized fluid collection. Musculoskeletal: Diffuse subcutaneus soft tissue edema. Interval worsening of a right iliac bone lytic lesion with associated 6 x 4.3 cm soft tissue mass (2:56). Mass erodes into the right sacral ala. Interval worsening of scattered lytic lesions of the vertebral bodies and sacral vertebral bodies. No acute displaced fracture. IMPRESSION: 1. Interval development of a 2 x 1 x 6 cm poorly visualized distal left iliopsoas abscess. 2.  Interval development of a small right and grossly stable small left pleural effusions. 3. Urinary bladder lumen is dilated with fluid with associated bilateral at least mild hydronephrosis. This may reflect changes of obstructive uropathy or reflux. 4. Interval decrease in size of bulky and conglomerative retroperitoneal, pelvic, inguinal lymphadenopathy. 5. Interval worsening of axial and appendicular lytic metastases. Interval worsening of a right iliac bone lytic lesion with associated 6 x 4.3 cm soft tissue mass that erodes into the right sacral ala. 6.  Aortic Atherosclerosis (ICD10-I70.0). Electronically Signed   By: Morgane  Naveau M.D.   On: 11/04/2023 22:14   VAS US  LOWER EXTREMITY VENOUS (DVT) Result Date: 11/04/2023  Lower Venous DVT Study Patient Name:  SYD NEWSOME  Date of Exam:   11/04/2023 Medical Rec #: [id number]            Accession #:    [id number] Date of Birth: 06/11/1955           Patient Gender: M Patient Age:   53 years Exam Location:  [facility] Procedure:      VAS US  LOWER EXTREMITY VENOUS (DVT) Referring Phys: AMRIT ADHIKARI --------------------------------------------------------------------------------  Indications: Edema (LLE).  Comparison Study: No previous exams Performing Technologist: Jody Hill RVT, RDMS  Examination Guidelines: A complete evaluation includes B-mode imaging, spectral Doppler, color Doppler, and power Doppler as needed of all accessible portions of each vessel. Bilateral testing is considered an integral part of a complete examination. Limited examinations for reoccurring indications may be performed as noted. The reflux portion of the exam is performed with the patient in reverse Trendelenburg.  +---------+---------------+---------+-----------+----------+--------------+ RIGHT    CompressibilityPhasicitySpontaneityPropertiesThrombus Aging +---------+---------------+---------+-----------+----------+--------------+ CFV      Full            Yes      Yes                                 +---------+---------------+---------+-----------+----------+--------------+ SFJ      Full                                                        +---------+---------------+---------+-----------+----------+--------------+  FV Prox  Full           Yes      Yes                                 +---------+---------------+---------+-----------+----------+--------------+ FV Mid   Full           Yes      Yes                                 +---------+---------------+---------+-----------+----------+--------------+ FV DistalFull           Yes      Yes                                 +---------+---------------+---------+-----------+----------+--------------+ PFV      Full                                                        +---------+---------------+---------+-----------+----------+--------------+ POP      Full           Yes      Yes                                 +---------+---------------+---------+-----------+----------+--------------+ PTV      Full                                                        +---------+---------------+---------+-----------+----------+--------------+ PERO     Full                                                        +---------+---------------+---------+-----------+----------+--------------+   +---------+---------------+---------+-----------+----------+--------------+ LEFT     CompressibilityPhasicitySpontaneityPropertiesThrombus Aging +---------+---------------+---------+-----------+----------+--------------+ CFV      Full           Yes      Yes                                 +---------+---------------+---------+-----------+----------+--------------+ SFJ      Full                                                        +---------+---------------+---------+-----------+----------+--------------+ FV Prox  Full           Yes      Yes                                  +---------+---------------+---------+-----------+----------+--------------+ FV Mid   Full  Yes      Yes                                 +---------+---------------+---------+-----------+----------+--------------+ FV DistalFull           Yes      Yes                                 +---------+---------------+---------+-----------+----------+--------------+ PFV      Full                                                        +---------+---------------+---------+-----------+----------+--------------+ POP      Full           Yes      Yes                                 +---------+---------------+---------+-----------+----------+--------------+ PTV      Full                                                        +---------+---------------+---------+-----------+----------+--------------+ PERO     Full                                                        +---------+---------------+---------+-----------+----------+--------------+     Summary: BILATERAL: - No evidence of deep vein thrombosis seen in the lower extremities, bilaterally. - RIGHT: - No cystic structure found in the popliteal fossa.  LEFT: - Ultrasound characteristics of a ruptured Baker's Cyst are noted.  *See table(s) above for measurements and observations. Electronically signed by Gaile New MD on 11/04/2023 at 8:26:24 PM.    Final    DG Abd 1 View Result Date: 11/03/2023 CLINICAL DATA:  Abdominal distension.  History of CLL. EXAM: ABDOMEN - 1 VIEW COMPARISON:  08/27/2023 FINDINGS: Bowel gas pattern is nonobstructive with a few air-filled nondilated small bowel loops over the right abdomen. No free peritoneal air. Mild degenerative change of the spine and hips. IMPRESSION: Nonobstructive bowel gas pattern. Electronically Signed   By: Toribio Agreste M.D.   On: 11/03/2023 12:05     ADDENDUM  .Patient was Personally and independently interviewed, examined and relevant elements of the history of  present illness were reviewed in details and an assessment and plan was created. All elements of the patient's history of present illness , assessment and plan were discussed in details with Pranathi Winfree NP. The above documentation reflects our combined findings assessment and plan. Discussed goals of care with the patient.  He has already had detailed discussions with the palliative care and with Dr. Clayton and in the context of progressive failure to thrive recurrent hospitalizations and worsening quality of life chooses to go to inpatient hospice for continued cares. He understands that due to his CLL is potentially treatable  he has not been able to maintain sustainable treatment due to significant limitations in his functional status due to other medical issues and lack of social support.  He is comfortable with the decision to have additional help to control his symptoms and try to help maintain his quality of life.  He continues to have very poor oral nutrition and limited motivation for physical therapy.  All of the patient's questions were answered in details. Much appreciate care by hospital medicine and the palliative care team and all the nursing staff.  Gautam Kale MD MS

## 2023-11-08 NOTE — Progress Notes (Signed)
   11/08/23 1330  Spiritual Encounters  Type of Visit Initial  Care provided to: Patient  Referral source Physician;Chaplain team  Reason for visit  (palliative care support)   Per referral from [facility] and Dr. Clayton on palliative, I sought to offer spiritual care support to Mr Tyler Scott.  [name] welcomed the visit but did not wish to engage or process at length.  I provided some education on spiritual support but primarily engaged relationally, establishing a relationship of care. Appreciated the opportunity to meet [name] and conveyed that chaplains remain available for support, listening, as needed.  Tyler Scott L. Delores HERO.Div

## 2023-11-10 ENCOUNTER — Inpatient Hospital Stay

## 2023-11-11 LAB — CULTURE, BLOOD (ROUTINE X 2)
Culture: NO GROWTH
Culture: NO GROWTH

## 2023-11-17 ENCOUNTER — Encounter

## 2023-11-17 ENCOUNTER — Inpatient Hospital Stay

## 2023-11-18 ENCOUNTER — Ambulatory Visit: Admitting: Internal Medicine

## 2023-11-24 ENCOUNTER — Inpatient Hospital Stay

## 2023-12-01 ENCOUNTER — Inpatient Hospital Stay

## 2023-12-01 ENCOUNTER — Encounter: Admitting: Dietician

## 2023-12-01 ENCOUNTER — Inpatient Hospital Stay: Admitting: Hematology

## 2023-12-02 ENCOUNTER — Inpatient Hospital Stay

## 2023-12-08 ENCOUNTER — Inpatient Hospital Stay

## 2023-12-11 DEATH — deceased

## 2023-12-24 ENCOUNTER — Encounter: Payer: Self-pay | Admitting: Hematology

## 2023-12-31 ENCOUNTER — Encounter (INDEPENDENT_AMBULATORY_CARE_PROVIDER_SITE_OTHER): Payer: Medicare Other | Admitting: Ophthalmology

## 2023-12-31 ENCOUNTER — Encounter: Payer: Self-pay | Admitting: Hematology
